# Patient Record
Sex: Male | Born: 1945 | Race: White | Hispanic: No | Marital: Married | Smoking: Never smoker
Health system: Southern US, Community
[De-identification: ages and names within clinical notes are randomized; demographics above are authoritative.]

## PROBLEM LIST (undated history)

## (undated) DIAGNOSIS — Z9981 Dependence on supplemental oxygen: Secondary | ICD-10-CM

## (undated) DIAGNOSIS — D509 Iron deficiency anemia, unspecified: Secondary | ICD-10-CM

## (undated) DIAGNOSIS — C4491 Basal cell carcinoma of skin, unspecified: Secondary | ICD-10-CM

## (undated) DIAGNOSIS — IMO0001 Reserved for inherently not codable concepts without codable children: Secondary | ICD-10-CM

## (undated) DIAGNOSIS — I480 Paroxysmal atrial fibrillation: Secondary | ICD-10-CM

## (undated) DIAGNOSIS — I499 Cardiac arrhythmia, unspecified: Secondary | ICD-10-CM

## (undated) DIAGNOSIS — R011 Cardiac murmur, unspecified: Secondary | ICD-10-CM

## (undated) DIAGNOSIS — I739 Peripheral vascular disease, unspecified: Secondary | ICD-10-CM

## (undated) DIAGNOSIS — G4733 Obstructive sleep apnea (adult) (pediatric): Secondary | ICD-10-CM

## (undated) DIAGNOSIS — H3581 Retinal edema: Secondary | ICD-10-CM

## (undated) DIAGNOSIS — E785 Hyperlipidemia, unspecified: Secondary | ICD-10-CM

## (undated) DIAGNOSIS — I350 Nonrheumatic aortic (valve) stenosis: Secondary | ICD-10-CM

## (undated) DIAGNOSIS — I2510 Atherosclerotic heart disease of native coronary artery without angina pectoris: Secondary | ICD-10-CM

## (undated) DIAGNOSIS — N189 Chronic kidney disease, unspecified: Secondary | ICD-10-CM

## (undated) DIAGNOSIS — I119 Hypertensive heart disease without heart failure: Secondary | ICD-10-CM

## (undated) DIAGNOSIS — Z9289 Personal history of other medical treatment: Secondary | ICD-10-CM

## (undated) DIAGNOSIS — D649 Anemia, unspecified: Secondary | ICD-10-CM

## (undated) DIAGNOSIS — I219 Acute myocardial infarction, unspecified: Secondary | ICD-10-CM

## (undated) DIAGNOSIS — I10 Essential (primary) hypertension: Secondary | ICD-10-CM

## (undated) DIAGNOSIS — E669 Obesity, unspecified: Secondary | ICD-10-CM

## (undated) DIAGNOSIS — K219 Gastro-esophageal reflux disease without esophagitis: Secondary | ICD-10-CM

## (undated) DIAGNOSIS — K259 Gastric ulcer, unspecified as acute or chronic, without hemorrhage or perforation: Secondary | ICD-10-CM

## (undated) DIAGNOSIS — I779 Disorder of arteries and arterioles, unspecified: Secondary | ICD-10-CM

## (undated) DIAGNOSIS — Z950 Presence of cardiac pacemaker: Secondary | ICD-10-CM

## (undated) DIAGNOSIS — E119 Type 2 diabetes mellitus without complications: Secondary | ICD-10-CM

## (undated) DIAGNOSIS — Z9989 Dependence on other enabling machines and devices: Secondary | ICD-10-CM

## (undated) HISTORY — PX: ANGIOPLASTY: SHX39

## (undated) HISTORY — PX: INGUINAL HERNIA REPAIR: SUR1180

## (undated) HISTORY — DX: Anemia, unspecified: D64.9

## (undated) HISTORY — PX: CORONARY ANGIOPLASTY: SHX604

## (undated) HISTORY — PX: BASAL CELL CARCINOMA EXCISION: SHX1214

## (undated) HISTORY — DX: Hypertensive heart disease without heart failure: I11.9

## (undated) HISTORY — DX: Retinal edema: H35.81

## (undated) HISTORY — PX: EYE SURGERY: SHX253

---
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
## 2004-05-25 DIAGNOSIS — K259 Gastric ulcer, unspecified as acute or chronic, without hemorrhage or perforation: Secondary | ICD-10-CM

## 2004-05-25 HISTORY — DX: Gastric ulcer, unspecified as acute or chronic, without hemorrhage or perforation: K25.9

## 2004-09-23 ENCOUNTER — Inpatient Hospital Stay (HOSPITAL_COMMUNITY): Admission: AD | Admit: 2004-09-23 | Discharge: 2004-09-25 | Payer: Self-pay | Admitting: Internal Medicine

## 2004-09-23 ENCOUNTER — Ambulatory Visit: Payer: Self-pay | Admitting: Internal Medicine

## 2004-09-29 ENCOUNTER — Ambulatory Visit: Payer: Self-pay | Admitting: Internal Medicine

## 2004-11-12 ENCOUNTER — Ambulatory Visit: Payer: Self-pay | Admitting: Internal Medicine

## 2004-11-19 ENCOUNTER — Ambulatory Visit: Payer: Self-pay | Admitting: Internal Medicine

## 2008-01-17 ENCOUNTER — Ambulatory Visit: Payer: Self-pay | Admitting: Cardiology

## 2008-02-21 ENCOUNTER — Ambulatory Visit: Payer: Self-pay

## 2008-02-21 ENCOUNTER — Encounter: Payer: Self-pay | Admitting: Cardiology

## 2008-03-14 ENCOUNTER — Ambulatory Visit: Payer: Self-pay | Admitting: Cardiology

## 2008-11-01 DIAGNOSIS — R0989 Other specified symptoms and signs involving the circulatory and respiratory systems: Secondary | ICD-10-CM | POA: Insufficient documentation

## 2008-11-01 DIAGNOSIS — I350 Nonrheumatic aortic (valve) stenosis: Secondary | ICD-10-CM | POA: Insufficient documentation

## 2009-01-03 ENCOUNTER — Encounter (INDEPENDENT_AMBULATORY_CARE_PROVIDER_SITE_OTHER): Payer: Self-pay | Admitting: *Deleted

## 2009-03-04 ENCOUNTER — Encounter (INDEPENDENT_AMBULATORY_CARE_PROVIDER_SITE_OTHER): Payer: Self-pay | Admitting: *Deleted

## 2009-03-04 DIAGNOSIS — R0989 Other specified symptoms and signs involving the circulatory and respiratory systems: Secondary | ICD-10-CM | POA: Insufficient documentation

## 2009-03-04 DIAGNOSIS — R0609 Other forms of dyspnea: Secondary | ICD-10-CM

## 2009-03-13 ENCOUNTER — Ambulatory Visit: Payer: Self-pay

## 2009-03-13 ENCOUNTER — Ambulatory Visit: Payer: Self-pay | Admitting: Cardiology

## 2009-03-13 DIAGNOSIS — I2510 Atherosclerotic heart disease of native coronary artery without angina pectoris: Secondary | ICD-10-CM | POA: Insufficient documentation

## 2009-05-25 HISTORY — PX: CORONARY ARTERY BYPASS GRAFT: SHX141

## 2010-02-10 ENCOUNTER — Telehealth: Payer: Self-pay | Admitting: Cardiology

## 2010-02-13 ENCOUNTER — Ambulatory Visit: Payer: Self-pay | Admitting: Cardiology

## 2010-02-14 ENCOUNTER — Ambulatory Visit: Payer: Self-pay | Admitting: Thoracic Surgery (Cardiothoracic Vascular Surgery)

## 2010-02-14 ENCOUNTER — Inpatient Hospital Stay (HOSPITAL_BASED_OUTPATIENT_CLINIC_OR_DEPARTMENT_OTHER): Admission: RE | Admit: 2010-02-14 | Discharge: 2010-02-14 | Payer: Self-pay | Admitting: Internal Medicine

## 2010-02-14 ENCOUNTER — Encounter: Payer: Self-pay | Admitting: Cardiology

## 2010-02-14 ENCOUNTER — Inpatient Hospital Stay (HOSPITAL_COMMUNITY): Admission: AD | Admit: 2010-02-14 | Discharge: 2010-02-22 | Payer: Self-pay | Admitting: Cardiology

## 2010-02-14 ENCOUNTER — Ambulatory Visit: Payer: Self-pay | Admitting: Internal Medicine

## 2010-02-19 ENCOUNTER — Telehealth: Payer: Self-pay | Admitting: Cardiology

## 2010-03-05 ENCOUNTER — Ambulatory Visit: Payer: Self-pay | Admitting: Cardiology

## 2010-03-05 ENCOUNTER — Emergency Department: Payer: Self-pay | Admitting: Emergency Medicine

## 2010-03-05 DIAGNOSIS — I4891 Unspecified atrial fibrillation: Secondary | ICD-10-CM | POA: Insufficient documentation

## 2010-03-19 ENCOUNTER — Encounter
Admission: RE | Admit: 2010-03-19 | Discharge: 2010-03-19 | Payer: Self-pay | Admitting: Thoracic Surgery (Cardiothoracic Vascular Surgery)

## 2010-03-25 ENCOUNTER — Encounter: Payer: Self-pay | Admitting: Cardiology

## 2010-03-25 ENCOUNTER — Ambulatory Visit: Payer: Self-pay | Admitting: Thoracic Surgery (Cardiothoracic Vascular Surgery)

## 2010-04-03 ENCOUNTER — Encounter (HOSPITAL_COMMUNITY)
Admission: RE | Admit: 2010-04-03 | Discharge: 2010-05-24 | Payer: Self-pay | Source: Home / Self Care | Attending: Cardiology | Admitting: Cardiology

## 2010-04-03 ENCOUNTER — Telehealth (INDEPENDENT_AMBULATORY_CARE_PROVIDER_SITE_OTHER): Payer: Self-pay | Admitting: *Deleted

## 2010-04-07 ENCOUNTER — Ambulatory Visit: Payer: Self-pay | Admitting: Cardiology

## 2010-04-07 DIAGNOSIS — I25810 Atherosclerosis of coronary artery bypass graft(s) without angina pectoris: Secondary | ICD-10-CM | POA: Insufficient documentation

## 2010-04-30 ENCOUNTER — Ambulatory Visit: Payer: Self-pay | Admitting: Cardiothoracic Surgery

## 2010-05-02 ENCOUNTER — Encounter: Payer: Self-pay | Admitting: Cardiology

## 2010-05-25 DIAGNOSIS — C4491 Basal cell carcinoma of skin, unspecified: Secondary | ICD-10-CM

## 2010-05-25 HISTORY — DX: Basal cell carcinoma of skin, unspecified: C44.91

## 2010-05-28 ENCOUNTER — Ambulatory Visit
Admission: RE | Admit: 2010-05-28 | Discharge: 2010-05-28 | Payer: Self-pay | Source: Home / Self Care | Attending: Cardiology | Admitting: Cardiology

## 2010-05-28 DIAGNOSIS — I6529 Occlusion and stenosis of unspecified carotid artery: Secondary | ICD-10-CM | POA: Insufficient documentation

## 2010-06-03 ENCOUNTER — Encounter: Payer: Self-pay | Admitting: Cardiology

## 2010-06-08 ENCOUNTER — Telehealth (INDEPENDENT_AMBULATORY_CARE_PROVIDER_SITE_OTHER): Payer: Self-pay | Admitting: *Deleted

## 2010-06-09 ENCOUNTER — Telehealth: Payer: Self-pay | Admitting: Cardiology

## 2010-06-22 LAB — CONVERTED CEMR LAB
BUN: 13 mg/dL (ref 6–23)
Basophils Absolute: 0 10*3/uL (ref 0.0–0.1)
Basophils Relative: 0.6 % (ref 0.0–3.0)
CO2: 24 meq/L (ref 19–32)
Calcium: 9.3 mg/dL (ref 8.4–10.5)
Chloride: 102 meq/L (ref 96–112)
Creatinine, Ser: 0.9 mg/dL (ref 0.4–1.5)
Eosinophils Absolute: 0.1 10*3/uL (ref 0.0–0.7)
Eosinophils Relative: 2.6 % (ref 0.0–5.0)
GFR calc non Af Amer: 96.49 mL/min (ref >60)
Glucose, Bld: 181 mg/dL — ABNORMAL HIGH (ref 70–99)
HCT: 41.5 % (ref 39.0–52.0)
Hemoglobin: 14.2 g/dL (ref 13.0–17.0)
INR: 0.9 (ref 0.8–1.0)
Lymphocytes Relative: 33.7 % (ref 12.0–46.0)
Lymphs Abs: 1.6 10*3/uL (ref 0.7–4.0)
MCHC: 34.1 g/dL (ref 30.0–36.0)
MCV: 91.4 fL (ref 78.0–100.0)
Monocytes Absolute: 0.4 10*3/uL (ref 0.1–1.0)
Monocytes Relative: 8.8 % (ref 3.0–12.0)
Neutro Abs: 2.6 10*3/uL (ref 1.4–7.7)
Neutrophils Relative %: 54.3 % (ref 43.0–77.0)
Platelets: 217 10*3/uL (ref 150.0–400.0)
Potassium: 4.7 meq/L (ref 3.5–5.1)
Prothrombin Time: 10 s (ref 9.7–11.8)
RBC: 4.54 M/uL (ref 4.22–5.81)
RDW: 12.8 % (ref 11.5–14.6)
Sodium: 140 meq/L (ref 135–145)
WBC: 4.8 10*3/uL (ref 4.5–10.5)
aPTT: 31.1 s — ABNORMAL HIGH (ref 21.7–28.8)

## 2010-06-26 NOTE — Assessment & Plan Note (Signed)
Summary: f17m/dfg   Visit Type:  Follow-up Primary Provider:  Loma Hardy  CC:  no complaints.  History of Present Illness: Seth Hardy returns for followup of his recent coronary bypass grafting.  He is doing remarkably well with no symptoms of angina or ischemia. His midline incision and sternotomy has healed. 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  He is anxious to begin officiating basketball. I released him to go this Friday night.  Clinical Reports Reviewed:  Cardiac Cath:  05/01/2010: Cardiac Cath Findings:   Left ventriculogram done in the RAO position showed an EF of 65% with no   regional Seth Hardy motion abnormalities.  Left subclavian angiography showed   patent LIMA to the chest Seth Hardy.  There was no high-grade stenosis in the   subclavian.      On panning down over the aorta, there was no evidence of abdominal   aortic aneurysm.      ASSESSMENT:   1. 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   2. Normal left ventricular function.   3. Mild aortic stenosis.      PLAN/DISCUSSION:  I have reviewed this also with Seth Hardy.  We both   agree that the best treatment option would be bypass surgery.  We will   have our colleagues from Cardiothoracic Surgery evaluate him.  He will   be admitted over the weekend and started on heparin and await bypass.               Seth Hardy. Bensimhon, MD         ______________________________   Carotid Doppler:  02/16/2010:  Summary:   Bilateral: moderate calcific plaque origin and proximal ICA. Right:   40-59% ICA stenosis, mid range of scale. Left: 60-79% ICA stenosis,   low range of scale. Bilateral vertebral artery flow is antegrade.   Prepared and Electronically Authenticated by    Seth Hardy   2011-09-26T11:38:[id number]  03/13/2009:  Impressions: Mild to moderate plaque, left > right. Stable 40-59% bilateral ICA stenosis.  Seth Haws, MD  02/21/2008:  Mild, bilateral  carotid disease, left greater than right 40 - 59% bilateral ICA stenosis   Current Medications (verified): 1)  Metformin Hcl 1000 Mg Tabs (Metformin Hcl) .Marland Kitchen.. 1 Tab Two Times A Day 2)  Amaryl 4 Mg Tabs (Glimepiride) .Marland Kitchen.. 1 Tab Two Times A Day 3)  Altace 5 Mg Caps (Ramipril) .Marland Kitchen.. 1 Cap Once Daily 4)  Zocor 40 Mg Tabs (Simvastatin) .Marland Kitchen.. 1 Tab Once Daily 5)  Aspirin 81 Mg Tbec (Aspirin) .... Take One Tablet By Mouth Daily 6)  Lantus 100 Unit/ml Soln (Insulin Glargine) .... As Directed 7)  Vitamin C 500 Mg Tabs (Ascorbic Acid) .Marland Kitchen.. 1 Tab Once Daily 8)  Tylenol 325 Mg Tabs (Acetaminophen) .... As Needed 9)  Aleve 220 Mg Tabs (Naproxen Sodium) .... As Needed 10)  Benadryl 25 Mg Caps (Diphenhydramine Hcl) .... As Needed 11)  Fish Oil 1200 Mg Caps (Omega-3 Fatty Acids) .Marland Kitchen.. 1 Cap Once Daily 12)  Nitrostat 0.4 Mg Subl (Nitroglycerin) .Marland Kitchen.. 1 Tablet Under Tongue At Onset of Chest Pain; You May Repeat Every 5 Minutes For Up To 3 Doses. 13)  Metoprolol Succinate 25 Mg Xr24h-Tab (Metoprolol Succinate) .Marland Kitchen.. 1 Tab Qam 14)  Tramadol Hcl 50 Mg Tabs (Tramadol Hcl) .Marland Kitchen.. 1 Tab As Needed 15)  Clonazepam 0.5 Mg Tabs (Clonazepam) .Marland Kitchen.. 1 Tab At Bedtime As Needed 16)  Onglyza 2.5 Mg Tabs (Saxagliptin Hcl) .... Take 1  Tablet By Mouth Once A Day  Allergies (verified): No Known Drug Allergies  Past History:  Past Medical History: Last updated: 03/04/2009 DYSPNEA ON EXERTION (ICD-786.09) MURMUR (ICD-785.2) BRUIT (ICD-785.9)    Past Surgical History: Last updated: 11/01/2008 cyst - L kidney Angioplasty  Social History: Last updated: 11/01/2008 Full Time Tobacco Use - No.  Alcohol Use - yes Regular Exercise - yes Drug Use - no  Risk Factors: Exercise: yes (11/01/2008)  Risk Factors: Smoking Status: never (11/01/2008)  Review of Systems       negative other history of present illness  Vital Signs:  Patient profile:   65 year old male Height:      68 inches Weight:      183 pounds BMI:      27.93 Pulse rate:   55 / minute BP sitting:   138 / 74  (left arm) Cuff size:   regular  Vitals Entered By: Seth Hardy, RMA (May 28, 2010 3:18 PM)  Physical Exam  General:  obese.   Head:  normocephalic and atraumatic Eyes:  PERRLA/EOM intact; conjunctiva and lids normal. Neck:  Neck supple, no JVD. No masses, thyromegaly or abnormal cervical nodes. Lungs:  Clear bilaterally to auscultation and percussion. Heart:  PMI nondisplaced regular rate and rhythm normal S1-S2, systolic murmur S2 splits, no obvious carotid bruit Msk:  Back normal, normal gait. Muscle strength and tone normal. Pulses:  pulses normal in all 4 extremities Extremities:  No clubbing or cyanosis. Neurologic:  Alert and oriented x 3. Skin:  Intact without lesions or rashes. Psych:  Normal affect.   Problems:  Medical Problems Added: 1)  Dx of Carotid Artery Stenosis, Without Infarction  (ICD-433.10)  Impression & Recommendations:  Problem # 1:  CAD, ARTERY BYPASS GRAFT (ICD-414.04)  His updated medication list for this problem includes:    Altace 5 Mg Caps (Ramipril) .Marland Kitchen... 1 cap once daily    Aspirin 81 Mg Tbec (Aspirin) .Marland Kitchen... Take one tablet by mouth daily    Nitrostat 0.4 Mg Subl (Nitroglycerin) .Marland Kitchen... 1 tablet under tongue at onset of chest pain; you may repeat every 5 minutes for up to 3 doses.    Metoprolol Succinate 25 Mg Xr24h-tab (Metoprolol succinate) .Marland Kitchen... 1 tab qam  Problem # 2:  ATRIAL FIBRILLATION (ICD-427.31) Assessment: Improved  His updated medication list for this problem includes:    Aspirin 81 Mg Tbec (Aspirin) .Marland Kitchen... Take one tablet by mouth daily    Metoprolol Succinate 25 Mg Xr24h-tab (Metoprolol succinate) .Marland Kitchen... 1 tab qam  Problem # 3:  CAD, NATIVE VESSEL (ICD-414.01)  His updated medication list for this problem includes:    Altace 5 Mg Caps (Ramipril) .Marland Kitchen... 1 cap once daily    Aspirin 81 Mg Tbec (Aspirin) .Marland Kitchen... Take one tablet by mouth daily    Nitrostat 0.4 Mg Subl  (Nitroglycerin) .Marland Kitchen... 1 tablet under tongue at onset of chest pain; you may repeat every 5 minutes for up to 3 doses.    Metoprolol Succinate 25 Mg Xr24h-tab (Metoprolol succinate) .Marland Kitchen... 1 tab qam  Problem # 4:  MURMUR (ICD-785.2) Assessment: Unchanged  His updated medication list for this problem includes:    Altace 5 Mg Caps (Ramipril) .Marland Kitchen... 1 cap once daily    Nitrostat 0.4 Mg Subl (Nitroglycerin) .Marland Kitchen... 1 tablet under tongue at onset of chest pain; you may repeat every 5 minutes for up to 3 doses.    Metoprolol Succinate 25 Mg Xr24h-tab (Metoprolol succinate) .Marland Kitchen... 1 tab qam  Patient Instructions:  1)  Your physician recommends that you schedule a follow-up appointment in: September 2012 with Dr. Daleen Squibb 2)  Your physician recommends that you continue on your current medications as directed. Please refer to the Current Medication list given to you today.  Appended Document: f38m/dfg    Clinical Lists Changes  Medications: Changed medication from ONGLYZA 2.5 MG TABS (SAXAGLIPTIN HCL) Take 1 tablet by mouth once a day to ONGLYZA 5 MG TABS (SAXAGLIPTIN HCL) Take 1 tablet daily Observations: Added new observation of PI CARDIO: Your physician recommends that you schedule a follow-up appointment in: September 2012 with Dr. Daleen Squibb Your physician recommends that you continue on your current medications as directed. Please refer to the Current Medication list given to you today. (05/28/2010 15:53)       Patient Instructions: 1)  Your physician recommends that you schedule a follow-up appointment in: September 2012 with Dr. Daleen Squibb 2)  Your physician recommends that you continue on your current medications as directed. Please refer to the Current Medication list given to you today.

## 2010-06-26 NOTE — Progress Notes (Signed)
Summary: chestpain  Phone Note Call from Patient Call back at Home Phone [phone] Call back at [phone]   Caller: Patient Reason for Call: Talk to Nurse Details for Reason: c/o chestpain for couple of days. pcp was not contacted.  Initial call taken by: Lorne Skeens,  February 10, 2010 10:08 AM  Follow-up for Phone Call        I spoke with Mr. Raburn and he has experienced "chest tightness" last Friday at a football game and again the weekend while walking in [place].  He is not having any chest tightness at this time.  He is calling for an appt. with Dr. Daleen Squibb asap.  He will be back in town on Wednesday.  I have instructed him that if he is to have any further angina or "chest tightness" he needs to seek medical help immediately irregardles what city he is in.  He agrees.   Mylo Red RN

## 2010-06-26 NOTE — Assessment & Plan Note (Signed)
Summary: f4-6wk/dfg   Visit Type:  4-6 wk f/u Primary Provider:  Loma Hardy  CC:  pt denies any cardiac complaint today.  History of Present Illness: Seth Hardy returns today for his coronary disease, status post carotid bypass grafting about 5 weeks ago, and postoperative atrial fibrillation.  He's had no further events since her last office visit. He is starting cardiac rehabilitation today. He is back to driving and he is been released by thoracic surgery. He is anxious to start refereeing again. I told him to wait until 1 January.  Meds reviewed and is very compliant. Blood pressures have been excellent as his heart rate. Blood sugars are good in the morning but higher in the evening. He wii talk  primary care about this.   Current Medications (verified): 1)  Metformin Hcl 1000 Mg Tabs (Metformin Hcl) .Marland Kitchen.. 1 Tab Two Times A Day 2)  Amaryl 4 Mg Tabs (Glimepiride) .Marland Kitchen.. 1 Tab Two Times A Day 3)  Altace 5 Mg Caps (Ramipril) .Marland Kitchen.. 1 Cap Once Daily 4)  Zocor 40 Mg Tabs (Simvastatin) .Marland Kitchen.. 1 Tab Once Daily 5)  Aspirin 81 Mg Tbec (Aspirin) .... Take One Tablet By Mouth Daily 6)  Lantus 100 Unit/ml Soln (Insulin Glargine) .... As Directed 7)  Vitamin C 500 Mg Tabs (Ascorbic Acid) .Marland Kitchen.. 1 Tab Once Daily 8)  Tylenol 325 Mg Tabs (Acetaminophen) .... As Needed 9)  Aleve 220 Mg Tabs (Naproxen Sodium) .... As Needed 10)  Benadryl 25 Mg Caps (Diphenhydramine Hcl) .... As Needed 11)  Fish Oil 1200 Mg Caps (Omega-3 Fatty Acids) .Marland Kitchen.. 1 Cap Once Daily 12)  Nitrostat 0.4 Mg Subl (Nitroglycerin) .Marland Kitchen.. 1 Tablet Under Tongue At Onset of Chest Pain; You May Repeat Every 5 Minutes For Up To 3 Doses. 13)  Metoprolol Succinate 25 Mg Xr24h-Tab (Metoprolol Succinate) .Marland Kitchen.. 1 Tab Qam 14)  Tramadol Hcl 50 Mg Tabs (Tramadol Hcl) .Marland Kitchen.. 1 Tab As Needed 15)  Clonazepam 0.5 Mg Tabs (Clonazepam) .Marland Kitchen.. 1 Tab At Bedtime As Needed  Allergies (verified): No Known Drug Allergies  Past History:  Past Medical  History: Last updated: 03/04/2009 DYSPNEA ON EXERTION (ICD-786.09) MURMUR (ICD-785.2) BRUIT (ICD-785.9)    Past Surgical History: Last updated: 11/01/2008 cyst - L kidney Angioplasty  Social History: Last updated: 11/01/2008 Full Time Tobacco Use - No.  Alcohol Use - yes Regular Exercise - yes Drug Use - no  Risk Factors: Exercise: yes (11/01/2008)  Risk Factors: Smoking Status: never (11/01/2008)  Review of Systems       negative other than history of present illness  Vital Signs:  Patient profile:   65 year old male Height:      68 inches Weight:      177.4 pounds BMI:     27.07 Pulse rate:   72 / minute Pulse rhythm:   irregular BP sitting:   142 / 70  (left arm) Cuff size:   large  Vitals Entered By: Danielle Rankin, CMA (April 07, 2010 11:15 AM)  Physical Exam  General:  obese.  no acute distress Head:  normocephalic and atraumatic Eyes:  PERRLA/EOM intact; conjunctiva and lids normal. Neck:  Neck supple, no JVD. No masses, thyromegaly or abnormal cervical nodes. Chest Wall:  no deformities or breast masses noted median sternotomy healing nicely Lungs:  Clear bilaterally to auscultation and percussion. Heart:  PMI nondisplaced, regular rate and rhythm, split S2, no rub or murmur. Carotid upstrokes equal bilaterally without bruits Msk:  Back normal, normal gait. Muscle strength and  tone normal. Pulses:  pulses normal in all 4 extremities Extremities:  No clubbing or cyanosis. Neurologic:  Alert and oriented x 3. Skin:  Intact without lesions or rashes. Psych:  Normal affect.   Impression & Recommendations:  Problem # 1:  ATRIAL FIBRILLATION (ICD-427.31) Assessment Improved He was very symptomatic and will most likely note he has recurrence. Hopefully he is beyond the postoperative vulnerable period. Continue current medications. His updated medication list for this problem includes:    Aspirin 81 Mg Tbec (Aspirin) .Marland Kitchen... Take one tablet by mouth  daily    Metoprolol Succinate 25 Mg Xr24h-tab (Metoprolol succinate) .Marland Kitchen... 1 tab qam  Problem # 2:  CAD, NATIVE VESSEL (ICD-414.01) Assessment: Unchanged  His updated medication list for this problem includes:    Altace 5 Mg Caps (Ramipril) .Marland Kitchen... 1 cap once daily    Aspirin 81 Mg Tbec (Aspirin) .Marland Kitchen... Take one tablet by mouth daily    Nitrostat 0.4 Mg Subl (Nitroglycerin) .Marland Kitchen... 1 tablet under tongue at onset of chest pain; you may repeat every 5 minutes for up to 3 doses.    Metoprolol Succinate 25 Mg Xr24h-tab (Metoprolol succinate) .Marland Kitchen... 1 tab qam  Patient Instructions: 1)  Your physician recommends that you schedule a follow-up appointment in: 2 months with Dr. Daleen Squibb 2)  Your physician recommends that you continue on your current medications as directed. Please refer to the Current Medication list given to you today. 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

## 2010-06-26 NOTE — Cardiovascular Report (Signed)
Summary: Pre Cath Orders   Pre Cath Orders   Imported By: Roderic Ovens 02/20/2010 16:22:29  _____________________________________________________________________  External Attachment:    Type:   Image     Comment:   External Document

## 2010-06-26 NOTE — Miscellaneous (Signed)
Summary: Orders Update  Clinical Lists Changes  Orders: Added new Test order of Carotid Duplex (Carotid Duplex) - Signed 

## 2010-06-26 NOTE — Miscellaneous (Signed)
Summary: [facility] Progress Note   [facility] Progress Note   Imported By: Roderic Ovens 05/27/2010 11:43:16  _____________________________________________________________________  External Attachment:    Type:   Image     Comment:   External Document

## 2010-06-26 NOTE — Letter (Signed)
Summary: Cardiac Catheterization Instructions- [facility]  [facility], Main Office  [address]   [place], [place] [address]   Phone: [phone]  Fax: [phone]     02/13/2010 MRN: [id number]  Seth Hardy [address] [place], [place]  [address]  Dear Mr. GRASMICK,   [name] are scheduled for a Cardiac Catheterization on 02/14/10 with Dr.Bensimhon  Please arrive to the 1st floor of the Heart and Vascular Center at [facility] at 11:00 am  on the day of your procedure. Please do not arrive before 6:30 a.m. Call the Heart and Vascular Center at [phone] if you are unable to make your appointmnet. The Code to get into the parking garage under the building is 0020. Take the elevators to the 1st floor. You must have someone to drive you home. Someone must be with you for the first 24 hours after you arrive home. Please wear clothes that are easy to get on and off and wear slip-on shoes. Do not eat or drink after midnight except water with your medications that morning. Bring all your medications and current insurance cards with you.  ___ DO NOT take these medications before your procedure:Metformin--don't take today 9/22, tomorrow 9/23, or the next day 9/24,hold Amarly and and only take 1/2 insulin dose All other medications ok to take with sip of water  ___ Make sure you take your aspirin.   The usual length of stay after your procedure is 2 to 3 hours. This can vary.  If you have any questions, please call the office at the number listed above.   Dennis Bast, RN, BSN

## 2010-06-26 NOTE — Assessment & Plan Note (Signed)
Summary: CAD/ANAS   Visit Type:  1 yr f/u Primary Provider:  Loma Sender  CC:  no cardiac complaints today.  History of Present Illness: [name] returns today for followup of his coronary disease, history of remote PCI in 1996, mild aortic stenosis, and nonobstructive carotid artery disease.  His telemetry is a dramatic. Specifically denies any dyspnea on exertion, syncope, or angina.  He denies any symptoms of TIAs or mini strokes. Carotid Dopplers are pending today.  His last stress Myoview as last year and it showed no ischemia with LV function. His last echocardiogram was last year as well which showed mild aortic stenosis.  Current Medications (verified): 1)  Metformin Hcl 1000 Mg Tabs (Metformin Hcl) .Marland Kitchen.. 1 Tab Two Times A Day 2)  Amaryl 4 Mg Tabs (Glimepiride) .Marland Kitchen.. 1 Tab Two Times A Day 3)  Altace 5 Mg Caps (Ramipril) .Marland Kitchen.. 1 Cap Once Daily 4)  Zocor 40 Mg Tabs (Simvastatin) .Marland Kitchen.. 1 Tab Once Daily 5)  Aspirin 81 Mg Tbec (Aspirin) .... Take One Tablet By Mouth Daily 6)  Lantus 100 Unit/ml Soln (Insulin Glargine) .... As Directed 7)  Vitamin E 400 Unit Caps (Vitamin E) .Marland Kitchen.. 1 Cap Once Daily 8)  Vitamin C 500 Mg Tabs (Ascorbic Acid) .Marland Kitchen.. 1 Tab Once Daily 9)  Cinnamon 500 Mg Tabs (Cinnamon) .... 2 Tab Once Daily 10)  Tylenol 325 Mg Tabs (Acetaminophen) .... As Needed 11)  Ibuprofen 200 Mg Tabs (Ibuprofen) .... As Needed 12)  Aleve 220 Mg Tabs (Naproxen Sodium) .... As Needed 13)  Benadryl 25 Mg Caps (Diphenhydramine Hcl) .... As Needed  Allergies (verified): No Known Drug Allergies  Past History:  Past Medical History: Last updated: 03/04/2009 DYSPNEA ON EXERTION (ICD-786.09) MURMUR (ICD-785.2) BRUIT (ICD-785.9)    Past Surgical History: Last updated: 11/01/2008 cyst - L kidney Angioplasty  Social History: Last updated: 11/01/2008 Full Time Tobacco Use - No.  Alcohol Use - yes Regular Exercise - yes Drug Use - no  Risk Factors: Exercise: yes  (11/01/2008)  Risk Factors: Smoking Status: never (11/01/2008)  Review of Systems       negative other than history of present illness  Vital Signs:  Patient profile:   65 year old male Height:      68 inches Weight:      182 pounds BMI:     27.77 Pulse rate:   58 / minute Pulse rhythm:   regular BP sitting:   138 / 80  (left arm) Cuff size:   large  Vitals Entered By: Danielle Rankin, CMA (March 13, 2009 12:14 PM)  Physical Exam  General:  obese.   Head:  normocephalic and atraumatic Eyes:  PERRLA/EOM intact; conjunctiva and lids normal. Mouth:  Teeth, gums and palate normal. Oral mucosa normal. Neck:  Neck supple, no JVD. No masses, thyromegaly or abnormal cervical nodes. Lungs:  Clear bilaterally to auscultation and percussion. Heart:  soft systolic murmur left lower sternal border, S2 splits, regular rate and rhythm, PMI nondisplaced and nonsustained. Right carotid bruit Abdomen:  Bowel sounds positive; abdomen soft and non-tender without masses, organomegaly, or hernias noted. No hepatosplenomegaly. Msk:  Back normal, normal gait. Muscle strength and tone normal. Pulses:  pulses normal in all 4 extremities Extremities:  No clubbing or cyanosis. Neurologic:  Alert and oriented x 3. Skin:  Intact without lesions or rashes. Psych:  Normal affect.   Impression & Recommendations:  Problem # 1:  CAD, NATIVE VESSEL (ICD-414.01) Assessment Unchanged  His updated medication list for  this problem includes:    Altace 5 Mg Caps (Ramipril) .Marland Kitchen... 1 cap once daily    Aspirin 81 Mg Tbec (Aspirin) .Marland Kitchen... Take one tablet by mouth daily  Problem # 2:  MURMUR (ICD-785.2) Assessment: Unchanged  His updated medication list for this problem includes:    Altace 5 Mg Caps (Ramipril) .Marland Kitchen... 1 cap once daily  His updated medication list for this problem includes:    Altace 5 Mg Caps (Ramipril) .Marland Kitchen... 1 cap once daily He has a history of aortic stenosis there was mild by 2-D  echocardiogram last year. He is totally asymptomatic. His S2 splits on exam. Will follow with him again next year.  Problem # 3:  BRUIT (ICD-785.9) Assessment: Unchanged carotid Dopplers have been done this morning.  Problem # 4:  DYSPNEA ON EXERTION (ICD-786.09) Assessment: Improved  His updated medication list for this problem includes:    Altace 5 Mg Caps (Ramipril) .Marland Kitchen... 1 cap once daily    Aspirin 81 Mg Tbec (Aspirin) .Marland Kitchen... Take one tablet by mouth daily  His updated medication list for this problem includes:    Altace 5 Mg Caps (Ramipril) .Marland Kitchen... 1 cap once daily    Aspirin 81 Mg Tbec (Aspirin) .Marland Kitchen... Take one tablet by mouth daily  Patient Instructions: 1)  Your physician recommends that you schedule a follow-up appointment in: 12 MONTHS 2)  Your physician recommends that you continue on your current medications as directed. Please refer to the Current Medication list given to you today.

## 2010-06-26 NOTE — Progress Notes (Signed)
Summary: wife has questions  Phone Note Call from Patient   Caller: Spouse Corrie Dandy [phone] Reason for Call: Talk to Nurse Summary of Call: pt had heart cath friday and bypass monday-wife mary has questions Initial call taken by: Glynda Jaeger,  February 19, 2010 3:38 PM  Follow-up for Phone Call        Reassured wife.  Pt is doing well.  Wanted Dr. Daleen Squibb to know he is doing better.  I will forward this to Dr. Lonia Chimera RN

## 2010-06-26 NOTE — Miscellaneous (Signed)
Summary: [facility] Physician Order/Treatment Plan   [facility] Physician Order/Treatment Plan   Imported By: Roderic Ovens 03/24/2010 10:46:28  _____________________________________________________________________  External Attachment:    Type:   Image     Comment:   External Document

## 2010-06-26 NOTE — Assessment & Plan Note (Signed)
Summary: rov. chestpain./ gd   Visit Type:  rov Primary Provider:  Loma Sender  CC:  chest discomfort...sob....denies any edema.  History of Present Illness: Mr. Hinderman returns today with a chief complaint of exertional chest tightness. 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 It is very similar to what he had back in the 90s prior to his intervention.  He had this again while being out of town over the weekend. He has not had rest or nocturnal symptoms.  He is compliant with his medications. He works a very tight schedule his wife is worried about the stress level.   Current Medications (verified): 1)  Metformin Hcl 1000 Mg Tabs (Metformin Hcl) .Marland Kitchen.. 1 Tab Two Times A Day 2)  Amaryl 4 Mg Tabs (Glimepiride) .Marland Kitchen.. 1 Tab Two Times A Day 3)  Altace 5 Mg Caps (Ramipril) .Marland Kitchen.. 1 Cap Once Daily 4)  Zocor 40 Mg Tabs (Simvastatin) .Marland Kitchen.. 1 Tab Once Daily 5)  Aspirin 81 Mg Tbec (Aspirin) .... Take One Tablet By Mouth Daily 6)  Lantus 100 Unit/ml Soln (Insulin Glargine) .... As Directed 7)  Vitamin C 500 Mg Tabs (Ascorbic Acid) .Marland Kitchen.. 1 Tab Once Daily 8)  Cinnamon 500 Mg Tabs (Cinnamon) .... 2 Tab Once Daily 9)  Tylenol 325 Mg Tabs (Acetaminophen) .... As Needed 10)  Ibuprofen 200 Mg Tabs (Ibuprofen) .... As Needed 11)  Aleve 220 Mg Tabs (Naproxen Sodium) .... As Needed 12)  Benadryl 25 Mg Caps (Diphenhydramine Hcl) .... As Needed 13)  Fish Oil 1200 Mg Caps (Omega-3 Fatty Acids) .Marland Kitchen.. 1 Cap Once Daily  Allergies (verified): No Known Drug Allergies  Past History:  Past Medical History: Last updated: 03/04/2009 DYSPNEA ON EXERTION (ICD-786.09) MURMUR (ICD-785.2) BRUIT (ICD-785.9)    Past Surgical History: Last updated: 11/01/2008 cyst - L kidney Angioplasty  Social History: Last updated: 11/01/2008 Full Time Tobacco Use - No.  Alcohol Use - yes Regular Exercise - yes Drug Use - no  Risk Factors: Exercise: yes (11/01/2008)  Risk  Factors: Smoking Status: never (11/01/2008)  Review of Systems       negative other than history of present illness  Vital Signs:  Patient profile:   65 year old male Height:      68 inches Weight:      180.8 pounds BMI:     27.59 Pulse rate:   57 / minute Pulse rhythm:   irregular BP sitting:   170 / 90  (left arm) Cuff size:   large  Vitals Entered By: Danielle Rankin, CMA (February 13, 2010 12:12 PM)  Physical Exam  General:  overweight, in no acute distress Head:  normocephalic and atraumatic Eyes:  PERRLA/EOM intact; conjunctiva and lids normal. Neck:  Neck supple, no JVD. No masses, thyromegaly or abnormal cervical nodes. Chest Wall:  no deformities or breast masses noted Lungs:  Clear bilaterally to auscultation and percussion. Heart:  PMI nondisplaced, soft S1-S2, soft systolic murmur along left sternal border, S2 splits, no carotid bruits Abdomen:  Bowel sounds positive; abdomen soft and non-tender without masses, organomegaly, or hernias noted. No hepatosplenomegaly. Msk:  Back normal, normal gait. Muscle strength and tone normal. Pulses:  pulses normal in all 4 extremities Extremities:  No clubbing or cyanosis. Neurologic:  Alert and oriented x 3. Skin:  Intact without lesions or rashes. Psych:  Normal affect.   EKG  Procedure date:  02/13/2010  Findings:      normal sinus rhythm, lateral ST depression and T-wave  inversion which is a little worse than his last EKG a year ago.  Impression & Recommendations:  Problem # 1:  CAD, NATIVE VESSEL (ICD-414.01) Assessment Deteriorated He is having exertional angina. He most likely has a recurrent obstructive lesion. We have given him instructions to not initiate or exert himself until his catheterization tomorrow. He was given several nitroglycerin with instructions on his shoes. He was still the head rest or nocturnal pain to call 911. Indications risks potential benefits discussed. He agrees to proceed. His updated  medication list for this problem includes:    Altace 5 Mg Caps (Ramipril) .Marland Kitchen... 1 cap once daily    Aspirin 81 Mg Tbec (Aspirin) .Marland Kitchen... Take one tablet by mouth daily    Nitrostat 0.4 Mg Subl (Nitroglycerin) .Marland Kitchen... 1 tablet under tongue at onset of chest pain; you may repeat every 5 minutes for up to 3 doses.  Orders: TLB-BMP (Basic Metabolic Panel-BMET) (80048-METABOL) TLB-CBC Platelet - w/Differential (85025-CBCD) TLB-PT (Protime) (85610-PTP) TLB-PTT (85730-PTTL)  Problem # 2:  MURMUR (ICD-785.2) He has mild aortic stenosis by echocardiogram. We should cross the valve tomorrow to check gradient. His updated medication list for this problem includes:    Altace 5 Mg Caps (Ramipril) .Marland Kitchen... 1 cap once daily    Nitrostat 0.4 Mg Subl (Nitroglycerin) .Marland Kitchen... 1 tablet under tongue at onset of chest pain; you may repeat every 5 minutes for up to 3 doses.  Problem # 3:  BRUIT (ICD-785.9) Assessment: Unchanged He will need outpatient carotid Dopplers. He is totally asymptomatic at present we'll continue medical therapy.  Other Orders: Cardiac Catheterization (Cardiac Cath)  Patient Instructions: 1)  Your physician has requested that you have a cardiac catheterization.  Cardiac catheterization is used to diagnose and/or treat various heart conditions. Doctors may recommend this procedure for a number of different reasons. The most common reason is to evaluate chest pain. Chest pain can be a symptom of coronary artery disease (CAD), and cardiac catheterization can show whether plaque is narrowing or blocking your heart's arteries. This procedure is also used to evaluate the valves, as well as measure the blood flow and oxygen levels in different parts of your heart.  For further information please visit [email or web address]  Please follow instruction sheet, as given. Prescriptions: NITROSTAT 0.4 MG SUBL (NITROGLYCERIN) 1 tablet under tongue at onset of chest pain; you may repeat every 5 minutes for up to  3 doses.  #25 x 2   Entered by:   Dennis Bast, RN, BSN   Authorized by:   Gaylord Shih, MD, [facility]   Signed by:   Dennis Bast, RN, BSN on 02/13/2010   Method used:   Electronically to        [facility] [address] (retail)       [address]       [place], [place]  [address]       Ph: [phone]       Fax: [phone]   RxID:   [id number]

## 2010-06-26 NOTE — Consult Note (Signed)
Summary: [facility]   [facility]   Imported By: Roderic Ovens 03/11/2010 15:11:48  _____________________________________________________________________  External Attachment:    Type:   Image     Comment:   External Document

## 2010-06-26 NOTE — Letter (Signed)
Summary: Appointment - Reminder 2  [facility], Main Office  [address]   [place], [place] [address]   Phone: [phone]  Fax: [phone]     January 03, 2009 MRN: [id number]   Seth Hardy [address] [place], [place]  [address]   Dear [name],  Our records indicate that it is time to schedule a follow-up appointment.  Dr.Wall recommended that you follow up with [place] in Oct 2010. It is very important that we reach you to schedule this appointment. We look forward to participating in your health care needs. Please contact us at the number listed above at your earliest convenience to schedule your appointment.  If you are unable to make an appointment at this time, give [place] a call so we can update our records.     Sincerely,  Lorne Skeens  [facility] Scheduling Team

## 2010-06-26 NOTE — Progress Notes (Signed)
Summary: Records Request  Faxed OV & EKG to [place] at [facility] ([phone]). Debby Freiberg  April 03, 2010 11:37 AM

## 2010-06-26 NOTE — Progress Notes (Signed)
Summary: Cardiology Phone Note - Fast HR  Phone Note Call from Patient   Caller: Patient Summary of Call: Pt called to report high heart rate. Chart reviewed, recent bypass 01/2010 with history of post-op AF at that time. Not on Coumadin. Today, before church noted his heart rate sped up. Initial heart rate 150. Took extra metoprolol --> 110, but his heart rate has continued to climb up to the 130's. He feels totally well without any CP, sob, dizziness, weakness. BP 120's-140's systolic. Discussed with Dr. Shirlee Latch --> directed pt to take additional metoprolol x 1 now, see how he feels. If he notes persistently elevated heart rate with accompanying symptoms or low BP, instructed to proceed to ER. Will also have pt seen in the office tomorrow. Pt expressed understanding. Initial call taken by: Ronie Spies PA-C

## 2010-06-26 NOTE — Letter (Signed)
Summary: [facility]   [facility]   Imported By: Marylou Mccoy 06/11/2010 16:03:35  _____________________________________________________________________  External Attachment:    Type:   Image     Comment:   External Document

## 2010-06-26 NOTE — Miscellaneous (Signed)
Summary: chart update  Clinical Lists Changes       Problems:  Medical Problems Added: 1)  Dx of Dyspnea On Exertion  (ICD-786.09)   Past History:  Past Medical History: DYSPNEA ON EXERTION (ICD-786.09) MURMUR (ICD-785.2) BRUIT (ICD-785.9)

## 2010-06-26 NOTE — Assessment & Plan Note (Signed)
Summary: eph/a-fib/per er/mt   Visit Type:  DOD ADD-ON Primary Cheo Selvey:  Loma Sender  CC:  pt went to [facility] last night for A-fib...Marland Kitchenpt states he was laying on his couch last night and said he felt his heart go out of rhythm.....denies any other complaints today.  History of Present Illness: Mr Dobratz comes in today after spending most of the night in the emergency room at [facility]. He was watching the news and felt his heart rate skipping and beating faster. His next-door neighbors anesthesiologist and she attained a heart rate of about 110-115 apically.  He went emergency room at [facility]. According to him he was given extra Toprol and oxygen. It settled down.  We've obtained blood work which was really unremarkable. Chest x-ray demonstrated bibasilar atelectasis with no overt fluid.  He is now about 2 weeks out from coronary artery bypass surgery. His postoperative course was remarkable only for some bradycardia which required external pacing. He remained in the 50s and 60s and so his beta blocker was decreased.   Current Medications (verified): 1)  Metformin Hcl 1000 Mg Tabs (Metformin Hcl) .Marland Kitchen.. 1 Tab Two Times A Day 2)  Amaryl 4 Mg Tabs (Glimepiride) .Marland Kitchen.. 1 Tab Two Times A Day 3)  Altace 5 Mg Caps (Ramipril) .Marland Kitchen.. 1 Cap Once Daily 4)  Zocor 40 Mg Tabs (Simvastatin) .Marland Kitchen.. 1 Tab Once Daily 5)  Aspirin 81 Mg Tbec (Aspirin) .... Take One Tablet By Mouth Daily 6)  Lantus 100 Unit/ml Soln (Insulin Glargine) .... As Directed 7)  Vitamin C 500 Mg Tabs (Ascorbic Acid) .Marland Kitchen.. 1 Tab Once Daily 8)  Tylenol 325 Mg Tabs (Acetaminophen) .... As Needed 9)  Aleve 220 Mg Tabs (Naproxen Sodium) .... As Needed 10)  Benadryl 25 Mg Caps (Diphenhydramine Hcl) .... As Needed 11)  Fish Oil 1200 Mg Caps (Omega-3 Fatty Acids) .Marland Kitchen.. 1 Cap Once Daily 12)  Nitrostat 0.4 Mg Subl (Nitroglycerin) .Marland Kitchen.. 1 Tablet Under Tongue At Onset of Chest Pain; You May Repeat Every 5 Minutes For Up To 3  Doses. 13)  Metoprolol Succinate 25 Mg Xr24h-Tab (Metoprolol Succinate) .Marland Kitchen.. 1 Tab Qam 14)  Tramadol Hcl 50 Mg Tabs (Tramadol Hcl) .Marland Kitchen.. 1 Tab As Needed 15)  Clonazepam 0.5 Mg Tabs (Clonazepam) .Marland Kitchen.. 1 Tab At Bedtime As Needed  Allergies (verified): No Known Drug Allergies  Past History:  Past Surgical History: Last updated: 11/01/2008 cyst - L kidney Angioplasty  Review of Systems       negative other than history of present illness  Vital Signs:  Patient profile:   65 year old male Height:      68 inches Weight:      176.12 pounds BMI:     26.88 Pulse rate:   67 / minute Pulse rhythm:   irregular BP sitting:   142 / 80  (left arm) Cuff size:   large  Vitals Entered By: Danielle Rankin, CMA (March 05, 2010 2:07 PM)  Physical Exam  General:  obese.   Head:  normocephalic and atraumatic Eyes:  PERRLA/EOM intact; conjunctiva and lids normal. Neck:  Neck supple, no JVD. No masses, thyromegaly or abnormal cervical nodes. Chest Wall:  healing median sternotomy Lungs:  decreased breath sounds in the bases Heart:  PMI nondisplaced, regular rate and rhythm, normal S1-S2, no rub Msk:  Back normal, normal gait. Muscle strength and tone normal. Pulses:  pulses normal in all 4 extremities Extremities:  trace right pedal edema.   Neurologic:  Alert and oriented x 3.  Skin:  Intact without lesions or rashes. Psych:  Normal affect.   Impression & Recommendations:  Problem # 1:  CAD, NATIVE VESSEL (ICD-414.01) Assessment Improved  His updated medication list for this problem includes:    Altace 5 Mg Caps (Ramipril) .Marland Kitchen... 1 cap once daily    Aspirin 81 Mg Tbec (Aspirin) .Marland Kitchen... Take one tablet by mouth daily    Nitrostat 0.4 Mg Subl (Nitroglycerin) .Marland Kitchen... 1 tablet under tongue at onset of chest pain; you may repeat every 5 minutes for up to 3 doses.    Metoprolol Succinate 25 Mg Xr24h-tab (Metoprolol succinate) .Marland Kitchen... 1 tab qam  Orders: EKG w/ Interpretation (93000)  Problem # 2:   MURMUR (ICD-785.2) Assessment: Unchanged  His updated medication list for this problem includes:    Altace 5 Mg Caps (Ramipril) .Marland Kitchen... 1 cap once daily    Nitrostat 0.4 Mg Subl (Nitroglycerin) .Marland Kitchen... 1 tablet under tongue at onset of chest pain; you may repeat every 5 minutes for up to 3 doses.    Metoprolol Succinate 25 Mg Xr24h-tab (Metoprolol succinate) .Marland Kitchen... 1 tab qam  Problem # 3:  ATRIAL FIBRILLATION (ICD-427.31) Assessment: New This is a new problem. He has had a significant problem postoperative bradycardia his heart rate was only in the 110 range when checked apically by his neighbor his anesthesiologist. He's currently in sinus rhythm. We'll continue with metoprolol succinate 25 mg each morning and instructed him to take an extra dose if this recurs. After 45 minutes to an hour if he is still symptomatic he has to report to you the office or the emergency room depending on the time of day. Reassurance was given. His updated medication list for this problem includes:    Aspirin 81 Mg Tbec (Aspirin) .Marland Kitchen... Take one tablet by mouth daily    Metoprolol Succinate 25 Mg Xr24h-tab (Metoprolol succinate) .Marland Kitchen... 1 tab qam  Patient Instructions: 1)  Your physician recommends that you schedule a follow-up appointment in: 4-6 weeks with Dr. Daleen Squibb 2)  Your physician recommends that you continue on your current medications as directed. Please refer to the Current Medication list given to you today.  If you experience atrial fibrillation again at home you may take an extra Metoprolol succinate 25mg .  It will take from to 1 hour to get into your system.  If  this does not control your heart rhythm please call EMS if it is after office hours.  If this occurs during office hours please call our office. Prescriptions: METOPROLOL SUCCINATE 25 MG XR24H-TAB (METOPROLOL SUCCINATE) 1 tab qam  #30 x 6   Entered by:   Lisabeth Devoid RN   Authorized by:   Gaylord Shih, MD, [facility]   Signed by:   Lisabeth Devoid RN  on 03/05/2010   Method used:   Electronically to        [facility] (retail)       [address]       [place], [place]  [address]       Ph: [phone]       Fax: [phone]   RxID:   [id number]

## 2010-06-26 NOTE — Progress Notes (Signed)
Summary: re afib  Phone Note Call from Patient Call back at Home Phone [phone] Call back at [phone]   Caller: Patient Reason for Call: Talk to Nurse Summary of Call: pt states he was in afib yesterday. pt did not go to the hospital. pt would like to talk to a nurse. pt states this morning his heart is fine his blood pressure was 127/67 Initial call taken by: Roe Coombs,  June 09, 2010 9:07 AM  Follow-up for Phone Call        I spoke with the pt and his heart rate was irregular and running around 150 yesterday. The pt did speak with [facility] and followed her instructions about taking metoprolol.  Today the pt feels okay.  His pulse is 54 and BP 127/67.  I will review this information with Dr Daleen Squibb for further recommendations.  Julieta Gutting, RN, BSN  June 09, 2010 9:38 AM  At this time Dr Daleen Squibb does not want to make any changes with pt treatment.  Dr Daleen Squibb would like the pt to call the office if he has recurrent symptoms. Pt aware and agreed with plan.   Julieta Gutting, RN, BSN  June 09, 2010 10:03 AM  Follow-up by: Gaylord Shih, MD, [facility],  June 09, 2010 10:02 AM

## 2010-07-10 NOTE — Progress Notes (Signed)
Summary: [facility]: Office Visit  [facility]: Office Visit   Imported By: Earl Many 07/03/2010 17:18:46  _____________________________________________________________________  External Attachment:    Type:   Image     Comment:   External Document

## 2010-08-01 ENCOUNTER — Encounter: Payer: Self-pay | Admitting: Cardiology

## 2010-08-04 LAB — GLUCOSE, CAPILLARY: Glucose-Capillary: 263 mg/dL — ABNORMAL HIGH (ref 70–99)

## 2010-08-05 LAB — GLUCOSE, CAPILLARY
Glucose-Capillary: 110 mg/dL — ABNORMAL HIGH (ref 70–99)
Glucose-Capillary: 117 mg/dL — ABNORMAL HIGH (ref 70–99)
Glucose-Capillary: 129 mg/dL — ABNORMAL HIGH (ref 70–99)
Glucose-Capillary: 162 mg/dL — ABNORMAL HIGH (ref 70–99)
Glucose-Capillary: 183 mg/dL — ABNORMAL HIGH (ref 70–99)
Glucose-Capillary: 189 mg/dL — ABNORMAL HIGH (ref 70–99)

## 2010-08-05 NOTE — Miscellaneous (Signed)
  Clinical Lists Changes LMOVM prescription change sent to pt pharmacy Mylo Red RN Medications: Changed medication from METOPROLOL SUCCINATE 25 MG XR24H-TAB (METOPROLOL SUCCINATE) 1 tab qam to METOPROLOL TARTRATE 25 MG TABS (METOPROLOL TARTRATE) Take one tablet by mouth twice a day - Signed Rx of METOPROLOL TARTRATE 25 MG TABS (METOPROLOL TARTRATE) Take one tablet by mouth twice a day;  #60 x 6;  Signed;  Entered by: Lisabeth Devoid RN;  Authorized by: Gaylord Shih, MD, [facility];  Method used: Electronically to [facility] [address], [place], [place], [place]  [address], Ph: [phone], Fax: [phone]    Prescriptions: METOPROLOL TARTRATE 25 MG TABS (METOPROLOL TARTRATE) Take one tablet by mouth twice a day  #60 x 6   Entered by:   Lisabeth Devoid RN   Authorized by:   Gaylord Shih, MD, [facility]   Signed by:   Lisabeth Devoid RN on 08/01/2010   Method used:   Electronically to        [facility] [address] (retail)       [address], [address]       [place], [place]  [address]       Ph: [phone]       Fax: [phone]   RxID:   [id number]

## 2010-08-06 LAB — GLUCOSE, CAPILLARY
Glucose-Capillary: 167 mg/dL — ABNORMAL HIGH (ref 70–99)
Glucose-Capillary: 186 mg/dL — ABNORMAL HIGH (ref 70–99)

## 2010-08-07 LAB — POCT I-STAT 3, ART BLOOD GAS (G3+)
Acid-base deficit: 2 mmol/L (ref 0.0–2.0)
Acid-base deficit: 3 mmol/L — ABNORMAL HIGH (ref 0.0–2.0)
Acid-base deficit: 3 mmol/L — ABNORMAL HIGH (ref 0.0–2.0)
Acid-base deficit: 3 mmol/L — ABNORMAL HIGH (ref 0.0–2.0)
Bicarbonate: 21.5 mEq/L (ref 20.0–24.0)
Bicarbonate: 21.8 mEq/L (ref 20.0–24.0)
Bicarbonate: 23.2 mEq/L (ref 20.0–24.0)
Bicarbonate: 23.9 mEq/L (ref 20.0–24.0)
Bicarbonate: 25.1 mEq/L — ABNORMAL HIGH (ref 20.0–24.0)
O2 Saturation: 100 %
O2 Saturation: 92 %
O2 Saturation: 95 %
O2 Saturation: 95 %
O2 Saturation: 98 %
Patient temperature: 35.4
Patient temperature: 37.7
Patient temperature: 37.9
Patient temperature: 38
TCO2: 23 mmol/L (ref 0–100)
TCO2: 23 mmol/L (ref 0–100)
TCO2: 24 mmol/L (ref 0–100)
TCO2: 25 mmol/L (ref 0–100)
TCO2: 26 mmol/L (ref 0–100)
pCO2 arterial: 36.7 mmHg (ref 35.0–45.0)
pCO2 arterial: 37.7 mmHg (ref 35.0–45.0)
pCO2 arterial: 40 mmHg (ref 35.0–45.0)
pCO2 arterial: 40.6 mmHg (ref 35.0–45.0)
pCO2 arterial: 53 mmHg — ABNORMAL HIGH (ref 35.0–45.0)
pH, Arterial: 7.267 — ABNORMAL LOW (ref 7.350–7.450)
pH, Arterial: 7.341 — ABNORMAL LOW (ref 7.350–7.450)
pH, Arterial: 7.379 (ref 7.350–7.450)
pH, Arterial: 7.39 (ref 7.350–7.450)
pH, Arterial: 7.406 (ref 7.350–7.450)
pO2, Arterial: 122 mmHg — ABNORMAL HIGH (ref 80.0–100.0)
pO2, Arterial: 242 mmHg — ABNORMAL HIGH (ref 80.0–100.0)
pO2, Arterial: 59 mmHg — ABNORMAL LOW (ref 80.0–100.0)
pO2, Arterial: 83 mmHg (ref 80.0–100.0)
pO2, Arterial: 94 mmHg (ref 80.0–100.0)

## 2010-08-07 LAB — APTT
aPTT: 30 seconds (ref 24–37)
aPTT: 35 seconds (ref 24–37)

## 2010-08-07 LAB — COMPREHENSIVE METABOLIC PANEL
ALT: 25 U/L (ref 0–53)
AST: 26 U/L (ref 0–37)
Albumin: 3.8 g/dL (ref 3.5–5.2)
Alkaline Phosphatase: 77 U/L (ref 39–117)
BUN: 11 mg/dL (ref 6–23)
CO2: 29 mEq/L (ref 19–32)
Calcium: 9 mg/dL (ref 8.4–10.5)
Chloride: 100 mEq/L (ref 96–112)
Creatinine, Ser: 1.08 mg/dL (ref 0.4–1.5)
GFR calc Af Amer: >60 mL/min (ref >60)
GFR calc non Af Amer: >60 mL/min (ref >60)
Glucose, Bld: 321 mg/dL — ABNORMAL HIGH (ref 70–99)
Potassium: 4.5 mEq/L (ref 3.5–5.1)
Sodium: 136 mEq/L (ref 135–145)
Total Bilirubin: 0.5 mg/dL (ref 0.3–1.2)
Total Protein: 6.3 g/dL (ref 6.0–8.3)

## 2010-08-07 LAB — GLUCOSE, CAPILLARY
Glucose-Capillary: 131 mg/dL — ABNORMAL HIGH (ref 70–99)
Glucose-Capillary: 144 mg/dL — ABNORMAL HIGH (ref 70–99)
Glucose-Capillary: 146 mg/dL — ABNORMAL HIGH (ref 70–99)
Glucose-Capillary: 158 mg/dL — ABNORMAL HIGH (ref 70–99)
Glucose-Capillary: 158 mg/dL — ABNORMAL HIGH (ref 70–99)
Glucose-Capillary: 165 mg/dL — ABNORMAL HIGH (ref 70–99)
Glucose-Capillary: 170 mg/dL — ABNORMAL HIGH (ref 70–99)
Glucose-Capillary: 172 mg/dL — ABNORMAL HIGH (ref 70–99)
Glucose-Capillary: 177 mg/dL — ABNORMAL HIGH (ref 70–99)
Glucose-Capillary: 178 mg/dL — ABNORMAL HIGH (ref 70–99)
Glucose-Capillary: 181 mg/dL — ABNORMAL HIGH (ref 70–99)
Glucose-Capillary: 182 mg/dL — ABNORMAL HIGH (ref 70–99)
Glucose-Capillary: 182 mg/dL — ABNORMAL HIGH (ref 70–99)
Glucose-Capillary: 183 mg/dL — ABNORMAL HIGH (ref 70–99)
Glucose-Capillary: 185 mg/dL — ABNORMAL HIGH (ref 70–99)
Glucose-Capillary: 190 mg/dL — ABNORMAL HIGH (ref 70–99)
Glucose-Capillary: 198 mg/dL — ABNORMAL HIGH (ref 70–99)
Glucose-Capillary: 199 mg/dL — ABNORMAL HIGH (ref 70–99)
Glucose-Capillary: 199 mg/dL — ABNORMAL HIGH (ref 70–99)
Glucose-Capillary: 204 mg/dL — ABNORMAL HIGH (ref 70–99)
Glucose-Capillary: 205 mg/dL — ABNORMAL HIGH (ref 70–99)
Glucose-Capillary: 210 mg/dL — ABNORMAL HIGH (ref 70–99)
Glucose-Capillary: 223 mg/dL — ABNORMAL HIGH (ref 70–99)
Glucose-Capillary: 227 mg/dL — ABNORMAL HIGH (ref 70–99)
Glucose-Capillary: 230 mg/dL — ABNORMAL HIGH (ref 70–99)
Glucose-Capillary: 235 mg/dL — ABNORMAL HIGH (ref 70–99)
Glucose-Capillary: 240 mg/dL — ABNORMAL HIGH (ref 70–99)
Glucose-Capillary: 243 mg/dL — ABNORMAL HIGH (ref 70–99)
Glucose-Capillary: 249 mg/dL — ABNORMAL HIGH (ref 70–99)
Glucose-Capillary: 252 mg/dL — ABNORMAL HIGH (ref 70–99)
Glucose-Capillary: 273 mg/dL — ABNORMAL HIGH (ref 70–99)
Glucose-Capillary: 275 mg/dL — ABNORMAL HIGH (ref 70–99)
Glucose-Capillary: 299 mg/dL — ABNORMAL HIGH (ref 70–99)
Glucose-Capillary: 347 mg/dL — ABNORMAL HIGH (ref 70–99)
Glucose-Capillary: 72 mg/dL (ref 70–99)
Glucose-Capillary: 98 mg/dL (ref 70–99)

## 2010-08-07 LAB — BLOOD GAS, ARTERIAL
Acid-Base Excess: 1.8 mmol/L (ref 0.0–2.0)
Bicarbonate: 26.3 mEq/L — ABNORMAL HIGH (ref 20.0–24.0)
Drawn by: 330991
FIO2: 0.21 %
O2 Saturation: 94.3 %
Patient temperature: 98.6
TCO2: 27.7 mmol/L (ref 0–100)
pCO2 arterial: 44.8 mmHg (ref 35.0–45.0)
pH, Arterial: 7.387 (ref 7.350–7.450)
pO2, Arterial: 70.7 mmHg — ABNORMAL LOW (ref 80.0–100.0)

## 2010-08-07 LAB — CREATININE, SERUM
Creatinine, Ser: 0.86 mg/dL (ref 0.4–1.5)
Creatinine, Ser: 1.13 mg/dL (ref 0.4–1.5)
GFR calc Af Amer: >60 mL/min (ref >60)
GFR calc Af Amer: >60 mL/min (ref >60)
GFR calc non Af Amer: >60 mL/min (ref >60)
GFR calc non Af Amer: >60 mL/min (ref >60)

## 2010-08-07 LAB — CBC
HCT: 31 % — ABNORMAL LOW (ref 39.0–52.0)
HCT: 31.4 % — ABNORMAL LOW (ref 39.0–52.0)
HCT: 32.4 % — ABNORMAL LOW (ref 39.0–52.0)
HCT: 32.9 % — ABNORMAL LOW (ref 39.0–52.0)
HCT: 33.7 % — ABNORMAL LOW (ref 39.0–52.0)
HCT: 40.1 % (ref 39.0–52.0)
HCT: 40.7 % (ref 39.0–52.0)
HCT: 40.7 % (ref 39.0–52.0)
HCT: 41.4 % (ref 39.0–52.0)
Hemoglobin: 10.3 g/dL — ABNORMAL LOW (ref 13.0–17.0)
Hemoglobin: 10.5 g/dL — ABNORMAL LOW (ref 13.0–17.0)
Hemoglobin: 10.6 g/dL — ABNORMAL LOW (ref 13.0–17.0)
Hemoglobin: 11 g/dL — ABNORMAL LOW (ref 13.0–17.0)
Hemoglobin: 11.3 g/dL — ABNORMAL LOW (ref 13.0–17.0)
Hemoglobin: 13.5 g/dL (ref 13.0–17.0)
Hemoglobin: 13.5 g/dL (ref 13.0–17.0)
Hemoglobin: 13.7 g/dL (ref 13.0–17.0)
Hemoglobin: 14 g/dL (ref 13.0–17.0)
MCH: 29.6 pg (ref 26.0–34.0)
MCH: 29.7 pg (ref 26.0–34.0)
MCH: 29.9 pg (ref 26.0–34.0)
MCH: 29.9 pg (ref 26.0–34.0)
MCH: 30.2 pg (ref 26.0–34.0)
MCH: 30.3 pg (ref 26.0–34.0)
MCH: 30.4 pg (ref 26.0–34.0)
MCH: 30.6 pg (ref 26.0–34.0)
MCH: 30.7 pg (ref 26.0–34.0)
MCHC: 32.6 g/dL (ref 30.0–36.0)
MCHC: 32.7 g/dL (ref 30.0–36.0)
MCHC: 33.2 g/dL (ref 30.0–36.0)
MCHC: 33.2 g/dL (ref 30.0–36.0)
MCHC: 33.4 g/dL (ref 30.0–36.0)
MCHC: 33.7 g/dL (ref 30.0–36.0)
MCHC: 33.7 g/dL (ref 30.0–36.0)
MCHC: 33.8 g/dL (ref 30.0–36.0)
MCHC: 34.3 g/dL (ref 30.0–36.0)
MCV: 87.9 fL (ref 78.0–100.0)
MCV: 88.7 fL (ref 78.0–100.0)
MCV: 89.4 fL (ref 78.0–100.0)
MCV: 89.9 fL (ref 78.0–100.0)
MCV: 90.2 fL (ref 78.0–100.0)
MCV: 90.2 fL (ref 78.0–100.0)
MCV: 90.4 fL (ref 78.0–100.0)
MCV: 92.6 fL (ref 78.0–100.0)
MCV: 92.6 fL (ref 78.0–100.0)
Platelets: 101 10*3/uL — ABNORMAL LOW (ref 150–400)
Platelets: 116 10*3/uL — ABNORMAL LOW (ref 150–400)
Platelets: 130 10*3/uL — ABNORMAL LOW (ref 150–400)
Platelets: 131 10*3/uL — ABNORMAL LOW (ref 150–400)
Platelets: 136 10*3/uL — ABNORMAL LOW (ref 150–400)
Platelets: 170 10*3/uL (ref 150–400)
Platelets: 172 10*3/uL (ref 150–400)
Platelets: 180 10*3/uL (ref 150–400)
Platelets: 185 10*3/uL (ref 150–400)
RBC: 3.45 MIL/uL — ABNORMAL LOW (ref 4.22–5.81)
RBC: 3.5 MIL/uL — ABNORMAL LOW (ref 4.22–5.81)
RBC: 3.54 MIL/uL — ABNORMAL LOW (ref 4.22–5.81)
RBC: 3.64 MIL/uL — ABNORMAL LOW (ref 4.22–5.81)
RBC: 3.68 MIL/uL — ABNORMAL LOW (ref 4.22–5.81)
RBC: 4.51 MIL/uL (ref 4.22–5.81)
RBC: 4.51 MIL/uL (ref 4.22–5.81)
RBC: 4.56 MIL/uL (ref 4.22–5.81)
RBC: 4.58 MIL/uL (ref 4.22–5.81)
RDW: 12.5 % (ref 11.5–15.5)
RDW: 12.5 % (ref 11.5–15.5)
RDW: 12.6 % (ref 11.5–15.5)
RDW: 12.6 % (ref 11.5–15.5)
RDW: 12.6 % (ref 11.5–15.5)
RDW: 12.7 % (ref 11.5–15.5)
RDW: 13 % (ref 11.5–15.5)
RDW: 13.3 % (ref 11.5–15.5)
RDW: 13.4 % (ref 11.5–15.5)
WBC: 4 10*3/uL (ref 4.0–10.5)
WBC: 4.2 10*3/uL (ref 4.0–10.5)
WBC: 4.3 10*3/uL (ref 4.0–10.5)
WBC: 4.8 10*3/uL (ref 4.0–10.5)
WBC: 5.7 10*3/uL (ref 4.0–10.5)
WBC: 8.9 10*3/uL (ref 4.0–10.5)
WBC: 9 10*3/uL (ref 4.0–10.5)
WBC: 9.2 10*3/uL (ref 4.0–10.5)
WBC: 9.3 10*3/uL (ref 4.0–10.5)

## 2010-08-07 LAB — BASIC METABOLIC PANEL
BUN: 11 mg/dL (ref 6–23)
BUN: 8 mg/dL (ref 6–23)
BUN: 8 mg/dL (ref 6–23)
BUN: 9 mg/dL (ref 6–23)
CO2: 23 mEq/L (ref 19–32)
CO2: 23 mEq/L (ref 19–32)
CO2: 26 mEq/L (ref 19–32)
CO2: 27 mEq/L (ref 19–32)
Calcium: 8 mg/dL — ABNORMAL LOW (ref 8.4–10.5)
Calcium: 8.5 mg/dL (ref 8.4–10.5)
Calcium: 8.8 mg/dL (ref 8.4–10.5)
Calcium: 9.1 mg/dL (ref 8.4–10.5)
Chloride: 104 mEq/L (ref 96–112)
Chloride: 104 mEq/L (ref 96–112)
Chloride: 105 mEq/L (ref 96–112)
Chloride: 112 mEq/L (ref 96–112)
Creatinine, Ser: 0.84 mg/dL (ref 0.4–1.5)
Creatinine, Ser: 0.87 mg/dL (ref 0.4–1.5)
Creatinine, Ser: 0.89 mg/dL (ref 0.4–1.5)
Creatinine, Ser: 0.89 mg/dL (ref 0.4–1.5)
GFR calc Af Amer: >60 mL/min (ref >60)
GFR calc Af Amer: >60 mL/min (ref >60)
GFR calc Af Amer: >60 mL/min (ref >60)
GFR calc Af Amer: >60 mL/min (ref >60)
GFR calc non Af Amer: >60 mL/min (ref >60)
GFR calc non Af Amer: >60 mL/min (ref >60)
GFR calc non Af Amer: >60 mL/min (ref >60)
GFR calc non Af Amer: >60 mL/min (ref >60)
Glucose, Bld: 165 mg/dL — ABNORMAL HIGH (ref 70–99)
Glucose, Bld: 172 mg/dL — ABNORMAL HIGH (ref 70–99)
Glucose, Bld: 192 mg/dL — ABNORMAL HIGH (ref 70–99)
Glucose, Bld: 220 mg/dL — ABNORMAL HIGH (ref 70–99)
Potassium: 4.1 mEq/L (ref 3.5–5.1)
Potassium: 4.3 mEq/L (ref 3.5–5.1)
Potassium: 4.3 mEq/L (ref 3.5–5.1)
Potassium: 4.7 mEq/L (ref 3.5–5.1)
Sodium: 136 mEq/L (ref 135–145)
Sodium: 137 mEq/L (ref 135–145)
Sodium: 139 mEq/L (ref 135–145)
Sodium: 140 mEq/L (ref 135–145)

## 2010-08-07 LAB — POCT I-STAT 4, (NA,K, GLUC, HGB,HCT)
Glucose, Bld: 115 mg/dL — ABNORMAL HIGH (ref 70–99)
Glucose, Bld: 136 mg/dL — ABNORMAL HIGH (ref 70–99)
Glucose, Bld: 145 mg/dL — ABNORMAL HIGH (ref 70–99)
Glucose, Bld: 153 mg/dL — ABNORMAL HIGH (ref 70–99)
Glucose, Bld: 181 mg/dL — ABNORMAL HIGH (ref 70–99)
Glucose, Bld: 211 mg/dL — ABNORMAL HIGH (ref 70–99)
HCT: 24 % — ABNORMAL LOW (ref 39.0–52.0)
HCT: 26 % — ABNORMAL LOW (ref 39.0–52.0)
HCT: 28 % — ABNORMAL LOW (ref 39.0–52.0)
HCT: 31 % — ABNORMAL LOW (ref 39.0–52.0)
HCT: 36 % — ABNORMAL LOW (ref 39.0–52.0)
HCT: 37 % — ABNORMAL LOW (ref 39.0–52.0)
Hemoglobin: 10.5 g/dL — ABNORMAL LOW (ref 13.0–17.0)
Hemoglobin: 12.2 g/dL — ABNORMAL LOW (ref 13.0–17.0)
Hemoglobin: 12.6 g/dL — ABNORMAL LOW (ref 13.0–17.0)
Hemoglobin: 8.2 g/dL — ABNORMAL LOW (ref 13.0–17.0)
Hemoglobin: 8.8 g/dL — ABNORMAL LOW (ref 13.0–17.0)
Hemoglobin: 9.5 g/dL — ABNORMAL LOW (ref 13.0–17.0)
Potassium: 3.8 mEq/L (ref 3.5–5.1)
Potassium: 4.1 mEq/L (ref 3.5–5.1)
Potassium: 4.3 mEq/L (ref 3.5–5.1)
Potassium: 4.4 mEq/L (ref 3.5–5.1)
Potassium: 4.6 mEq/L (ref 3.5–5.1)
Potassium: 5.4 mEq/L — ABNORMAL HIGH (ref 3.5–5.1)
Sodium: 136 mEq/L (ref 135–145)
Sodium: 136 mEq/L (ref 135–145)
Sodium: 137 mEq/L (ref 135–145)
Sodium: 138 mEq/L (ref 135–145)
Sodium: 139 mEq/L (ref 135–145)
Sodium: 143 mEq/L (ref 135–145)

## 2010-08-07 LAB — URINALYSIS, MICROSCOPIC ONLY
Bilirubin Urine: NEGATIVE
Glucose, UA: NEGATIVE mg/dL
Hgb urine dipstick: NEGATIVE
Ketones, ur: NEGATIVE mg/dL
Leukocytes, UA: NEGATIVE
Nitrite: NEGATIVE
Protein, ur: NEGATIVE mg/dL
Specific Gravity, Urine: 1.014 (ref 1.005–1.030)
Urobilinogen, UA: 1 mg/dL (ref 0.0–1.0)
pH: 5.5 (ref 5.0–8.0)

## 2010-08-07 LAB — LIPID PANEL
Cholesterol: 130 mg/dL (ref 0–200)
HDL: 33 mg/dL — ABNORMAL LOW (ref >39)
LDL Cholesterol: 66 mg/dL (ref 0–99)
Total CHOL/HDL Ratio: 3.9 RATIO
Triglycerides: 156 mg/dL — ABNORMAL HIGH (ref <150)
VLDL: 31 mg/dL (ref 0–40)

## 2010-08-07 LAB — MAGNESIUM
Magnesium: 2.2 mg/dL (ref 1.5–2.5)
Magnesium: 2.4 mg/dL (ref 1.5–2.5)
Magnesium: 3.1 mg/dL — ABNORMAL HIGH (ref 1.5–2.5)

## 2010-08-07 LAB — PLATELET COUNT: Platelets: 122 10*3/uL — ABNORMAL LOW (ref 150–400)

## 2010-08-07 LAB — POCT I-STAT, CHEM 8
BUN: 6 mg/dL (ref 6–23)
Calcium, Ion: 1.17 mmol/L (ref 1.12–1.32)
Chloride: 109 mEq/L (ref 96–112)
Creatinine, Ser: 0.8 mg/dL (ref 0.4–1.5)
Glucose, Bld: 187 mg/dL — ABNORMAL HIGH (ref 70–99)
HCT: 34 % — ABNORMAL LOW (ref 39.0–52.0)
Hemoglobin: 11.6 g/dL — ABNORMAL LOW (ref 13.0–17.0)
Potassium: 5.1 mEq/L (ref 3.5–5.1)
Sodium: 142 mEq/L (ref 135–145)
TCO2: 22 mmol/L (ref 0–100)

## 2010-08-07 LAB — HEMOGLOBIN AND HEMATOCRIT, BLOOD
HCT: 25.7 % — ABNORMAL LOW (ref 39.0–52.0)
Hemoglobin: 8.9 g/dL — ABNORMAL LOW (ref 13.0–17.0)

## 2010-08-07 LAB — HEMOGLOBIN A1C
Hgb A1c MFr Bld: 8.9 % — ABNORMAL HIGH (ref <5.7)
Mean Plasma Glucose: 209 mg/dL — ABNORMAL HIGH (ref <117)

## 2010-08-07 LAB — MRSA PCR SCREENING: MRSA by PCR: NEGATIVE

## 2010-08-07 LAB — HEPARIN LEVEL (UNFRACTIONATED)
Heparin Unfractionated: 0.3 IU/mL (ref 0.30–0.70)
Heparin Unfractionated: 0.31 IU/mL (ref 0.30–0.70)
Heparin Unfractionated: 0.36 IU/mL (ref 0.30–0.70)
Heparin Unfractionated: 0.39 IU/mL (ref 0.30–0.70)

## 2010-08-07 LAB — PROTIME-INR
INR: 0.96 (ref 0.00–1.49)
INR: 1.4 (ref 0.00–1.49)
Prothrombin Time: 13 seconds (ref 11.6–15.2)
Prothrombin Time: 17.4 seconds — ABNORMAL HIGH (ref 11.6–15.2)

## 2010-08-07 LAB — POCT I-STAT GLUCOSE
Glucose, Bld: 183 mg/dL — ABNORMAL HIGH (ref 70–99)
Operator id: 194801

## 2010-08-07 LAB — ABO/RH: ABO/RH(D): O POS

## 2010-08-07 LAB — TYPE AND SCREEN
ABO/RH(D): O POS
Antibody Screen: NEGATIVE

## 2010-10-07 NOTE — Assessment & Plan Note (Signed)
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  He  also was found to have three-vessel disease and underwent coronary  bypass grafting x4 on February 17, 2010.  His postoperative course was  unremarkable.  He was discharged without any complications.  He did get  apparently readmitted to the hospital with some transient atrial  fibrillation a couple of weeks ago but that resolved and he was  discharged home.  He states he has been doing well.  He has not taken a  pain pill in about 3 weeks.  He is walking three miles a day.  He is  very anxious to get back into his full activities.  His blood sugars  have been well controlled in the mornings, but the evenings had been  running very high but until about 3 days ago and they have been running  pretty steadily.  In fact his p.m. glucose last night was 120.   CURRENT MEDICATIONS:  1. Aspirin 81 mg daily.  2. Toprol-XL 25 mg daily.  3. Altace 5 mg daily.  4. Amaryl 4 mg b.i.Hardy.  5. Fish oil 1200 mg daily.  6. Lantus 17 units subcu q.a.m.  7. Metformin 1000 mg b.i.Hardy.  8. Tylenol 325 mg p.r.n.  9. Vitamin C 300 mg daily.  10.Zocor 40 mg at bedtime.   ALLERGIES:  He has no known drug allergies.   PHYSICAL EXAMINATION:  General:  The patient is a well-appearing 63-year-  old gentleman in no acute distress.  Vital Signs:  Blood pressure is  138/73, pulse 64, respirations are 20, oxygen saturation is 90% on room  air.  Neurologic:  He is alert and oriented x3 with no focal deficits.  HEENT:  Unremarkable.  Chest:  His sternum is stable.  His sternal  incision is clean, dry, and intact.  Cardiac:  Regular rate and rhythm.  Normal S1 and S2.  There is a 2/6 systolic murmur which was present  preoperatively.  Lungs:  Clear with equal breath sounds.  Extremities:  Trace edema in the right leg.  Incisions are well healed.   IMAGING:  Chest x-ray shows good aeration of the lungs bilaterally.   IMPRESSION:  The patient is a 65 year old gentleman status post coronary  bypass grafting.  He is now about a month out from the operation.  He is  doing extremely well at this point in time.  He is having minimal  discomfort.  His exercise tolerance is excellent.  He is very anxious to  resume his activities.  I told him that he can gradually over the next  month or so start working himself back into work but not to exceed 1-2  hours a day for the 3-4 hours a day for the second week and so on and  certainly should not try to work full-time for the first of December at  the earliest.  He may begin driving a, car appropriate cautions were  discussed with that as well.  He is not to lift any objects greater than  10  pounds for at least another 2 weeks and anything over 20 pounds for  another 4 weeks.  In terms of going back to referring basketball, I  think he should not attempt that before the first of December and  probably would be better off waiting until around the holidays to start  that.  He will continue to be followed by Dr. Valera Castle and I will be  happy to see him back at anytime in the future that can be of any  further assistance with his care.   Seth Hardy, M.Hardy.  Electronically Signed   SCH/MEDQ  Hardy:  03/25/2010  T:  03/26/2010  Job:  [id number]   cc:   Thomas C. Daleen Squibb, MD, [facility]  Darl Pikes A. Toni Arthurs, DNP, FNP-BC

## 2010-10-07 NOTE — Assessment & Plan Note (Signed)
OFFICE VISIT   Seth Hardy, Seth Hardy  DOB:  [birth year]                                        February 28, 2010  CHART #:  [id number]   The patient called approximately 6 a.m. this morning, February 28, 2010.  He had undergone coronary artery bypass by Dr. Dorris Fetch on February 17, 2010.  This morning, his complaint was that he had trouble sleeping,  took Benadryl, and felt anxious and coming out of my skin.  He had no  fever, chills.  Blood pressure according to his measurement was 120/60.  He has had no wound problems.  His primary question was could he resume  his Ativan which he had been taking preop and would it interfere with  his other medications.  I instructed him not to use any more Benadryl  for sleep as that may have an idiosyncratic reaction to the Benadryl and  that it was okay to resume his previous prescription of Ativan.  The  patient has a followup appointment with Dr. Dorris Fetch next week.  If  he has any further trouble, he will contact the office.   Sheliah Plane, MD  Electronically Signed   EG/MEDQ  Hardy:  02/28/2010  T:  02/28/2010  Job:  [id number]

## 2010-10-07 NOTE — Assessment & Plan Note (Signed)
[facility] OFFICE NOTE   Seth, Hardy                     MRN:          [id number]  DATE:01/17/2008                            DOB:          16-Feb-1946    CHIEF COMPLAINT:  Reestablish with me as his cardiologist, history of an  angioplasty.   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  He had an angioplasty in 1996.  At that time,  he has had no recurrent problems.  I have not seen him in over 5 years.  We have  been unable to find any records even from the [facility].   He follows along fairly closely with Dr. Loma Sender.   He is denying any chest discomfort at present.  He does have some mild  dyspnea on exertion.  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  He enjoys riding a  bike as well.   PAST MEDICAL HISTORY:  He does not smoke.  He never has.  He has some  social alcoholic beverages, otherwise negative.   ALLERGIES:  He is not allergic to dye.  He has no known drug allergies.   CURRENT MEDICATIONS:  1. Metformin 1000 mg p.o. b.i.d.  2. Amaryl 4 mg p.o. b.i.d.  3. Altace 5 mg a day.  4. Zocor 40 mg a day.  5. Aspirin 81 mg a day.  6. Lantus as directed.  7. Vitamin E, C, and Cinnamon.   SURGICAL HISTORY:  He had a traumatic left renal cyst from a football  injury that was removed at age 58, in 54.  Other than, he had the  angioplasty in 1996.   SOCIAL HISTORY:  He has 2 children.  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   FAMILY HISTORY:  Negative for premature coronary artery disease.   REVIEW OF SYSTEMS:  History of a bleeding ulcer was cauterized a couple  years ago.  He has had no further bleeding.  He has some sexual  dysfunction with erectile  dysfunction and was recently evaluated at  [facility].  He has type 2 diabetes.  His last hemoglobin A1c was  above 8%.   He also has a history of hyperlipidemia.  His last LDL was 71, HDL 37.   PHYSICAL EXAMINATION:  VITAL SIGNS:  His blood pressure is 136/74, his  pulse 62 and regular.  He is on 5 feet 8 inches, weighs 182 pounds.  HEENT:  Normocephalic, atraumatic.  PERRLA.  Extraocular ocular  movements intact.  Sclerae are clear except for the right is injected.  He wears glasses.  Facial symmetry is normal.  Dentition is  satisfactory.  NECK:  Supple.  Carotids upstrokes are equal bilaterally with either  referred sounds or bilateral soft bruits.  Thyroid is not enlarged.  Trachea is midline.  LUNGS:  Clear.  HEART:  Reveals regular rate and rhythm.  Systolic murmur along left  sternal border.  S2 seems to split.  There is no diastolic component.  ABDOMEN:  Soft, good bowel sounds.  No midline or pulsatile mass.  No  hepatomegaly.  EXTREMITIES:  No edema.  Pulses are present 2+/4+ throughout.  No sign  of DVT.  NEURO:  Intact.  SKIN:  Unremarkable.   His electrocardiogram demonstrates sinus rhythm with nonspecific ST-  segment changes, inferolaterally.   ASSESSMENT AND PLAN:  [name] is doing well from our standpoint.  He is long overdue objective assessment of his coronaries.  He also has  possible carotid artery disease by exam and probably some aortic  sclerosis or mild stenosis.   PLAN:  1. Carotid Dopplers.  2. Exercise rest/stress Myoview  3. 2-D echocardiogram.   I will schedule him for followup with me in about 3 weeks, so we can  answer all his questions.  I have made no changes in his medical  program.     Jesse Sans. Daleen Squibb, MD, [facility]  Electronically Signed    TCW/MedQ  DD: 01/17/2008  DT: 01/18/2008  Job #: [id number]   cc:   Loma Sender

## 2010-10-07 NOTE — Assessment & Plan Note (Signed)
[facility]                            CARDIOLOGY OFFICE NOTE   GARY, GABRIELSEN                     MRN:          [id number]  DATE:03/14/2008                            DOB:          12-07-1945    Mr. Bee returns today for followup.  He was having some dyspnea on  exertion.  I have not seen him in over 5 years.  We obtained a stress  Myoview which showed excellent exercise tolerance, EF 59% with no sign  of scar or ischemia.  He had a remote PTCA in 1996.  I also heard a  murmur with echo showing mild aortic stenosis with a mean gradient of  10.  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  He is asymptomatic.   I reviewed all these findings at length with Mr. Storlie.  I have  advised him to return to see me in 1 year.  At that time, I will repeat  carotid Dopplers.     Thomas C. Daleen Squibb, MD, [facility]  Electronically Signed    TCW/MedQ  DD: 03/14/2008  DT: 03/15/2008  Job #: [id number]   cc:   Loma Sender

## 2010-10-10 NOTE — H&P (Signed)
Seth Hardy, Seth Hardy              ACCOUNT NO.:  [id number]   MEDICAL RECORD NO.:  [id number]          PATIENT TYPE:  INP   LOCATION:  5003                         FACILITY:  [facility]   PHYSICIAN:  Iva Boop, M.D. LHCDATE OF BIRTH:  03-Feb-1946   DATE OF ADMISSION:  09/23/2004  DATE OF DISCHARGE:                                HISTORY & PHYSICAL   CHIEF COMPLAINT:  Black stools, lightheaded.   HISTORY:  This 65 year old white man felt lightheaded last Thursday,  approximately 5 days ago.  It was transient and was not associated with  other symptoms.  Then yesterday he had one melenic stool and then two more  today.  After a melenic stool this morning and some lightheadedness, he went  to see Dr. Loma Sender, his primary care physician.  In the office, he  was found to have melenic and strongly heme-positive stool.  He was mildly  orthostatic apparently, though his blood pressure was normal overall.  His  hemoglobin was 10.6 per conversation with Dr. Vear Clock that I had.  The  patient takes an aspirin a day because of cardiac issues and diabetes but  uses no other significant nonsteroidals except for an occasional Aleve.  He  has not used that in some time.  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  He describes no chronic indigestion other than  occasional symptoms.  No dysphagia, no other bowel habit changes.  He has  never had a bleeding ulcer.  He takes no other antiplatelet agents.  He is  not really having any abdominal pain, though while speaking to me, he became  nauseous and slightly diaphoretic.   DRUG ALLERGIES:  None known.   PAST MEDICAL HISTORY:  1.  Coronary artery disease with percutaneous transluminal cardiac      angioplasty about 10 years ago.  No history of MI, though.  2.  Diabetes mellitus, type 2.  3.  Dyslipidemia.  4.  Kidney cyst surgery at age  37.   MEDICATIONS:  1.  Altace 10 mg daily.  2.  Amaryl 4 mg daily.  3.  Avandia 4 mg daily.  4.  Metformin 1000 mg b.i.d.  5.  Enteric-coated aspirin 325 mg daily.  6.  Study drug, fenofibrate versus placebo at [facility], coordinated by Dr.      Daron Offer.  7.  Vitamins C, E, multivitamins, kelp, and calcium supplements.   FAMILY HISTORY:  Parents are both alive.  Mother had bladder cancer.  Father  has been okay.  There is no colon cancer.   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  No tobacco.  Only occasional alcohol.  He is to go to [place] in a week on a golf trip  for 10 days.   REVIEW OF SYSTEMS:  As mentioned above.  He uses eyeglasses for reading.  He  thought his long-distance vision was slightly blurry in the last day or so.  He denies any angina.  He has no  significant aches or pains or any other  symptoms at this time on complete Review of Systems.  He does have a history  of skin cancer, however, not mentioned above in the Past Medical History.   PHYSICAL EXAMINATION:  GENERAL:  Middle-aged, overweight white man in no  acute distress.  VITAL SIGNS:  Temperature 98.4, pulse 89, blood pressure 144/78,  respirations 19.  Orthostatic blood pressure:  Supine blood pressure 160/62  with pulse of 69, sitting 147/60 with pulse 72, standing 138/58 with pulse  76.  HEENT:  Eyes anicteric.  Mouth:  Posterior pharynx free of lesions.  NECK:  Supple.  CHEST:  Clear.  HEART:  S1, S2.  There is a 2/6 systolic murmur.  ABDOMEN:  Soft, nontender with blood pressure present.  There is a midline  abdominal scar from previous surgery at age 48.  There is no organomegaly or  mass.  EXTREMITIES:  No edema.  RECTAL:  I have not repeated his rectal exam.  SKIN:  He is pale, and he is mildly diaphoretic.   LABORATORY DATA AND OTHER STUDIES:  CBG 146.  On the CMET, glucose 68,  sodium 133, otherwise normal CMET including BUN and creatinine ratio.  Coags  are normal.   Hemoglobin 10.6, hematocrit 30, platelets 214, white count 4.2.   ASSESSMENT:  1.  Melena with presumed upper gastrointestinal bleed, though BUN and      creatinine ratio is normal.  This could be a right colon bleed or small-      bowel bleed.  2.  Anemia secondary to #1.  3.  Diabetes mellitus, type 2, with some mild hyperglycemia.  4.  Mild hyponatremia.  5.  Mild orthostatic hypotension.  6.  Coronary artery disease with aspirin use but no other nonsteroidals in a      significant fashion.   RECOMMENDATIONS AND PLAN:  1.  EGD tomorrow.  2.  If that is negative, then a colonoscopy would be necessary.  3.  Serial hemoglobin and transfuse if hemoglobin less than 8.5.  I have      reviewed the risks of transfusion with the patient.  Risks include      infection with hepatitis or AIDS as well as transfusion reactions.  4.  Continue to monitor blood sugars.  5.  Hold Altace and diabetic medications while n.p.o.  Hold aspirin as well.      Will hold Altace also given      orthostatic blood pressure.  6.  Hydration with normal saline.   I appreciate the opportunity to care for this patient.      CEG/MEDQ  D:  09/23/2004  T:  09/23/2004  Job:  [id number]   cc:   Loma Sender, M.D.

## 2010-10-10 NOTE — Discharge Summary (Signed)
Seth Hardy, Seth Hardy              ACCOUNT NO.:  [id number]   MEDICAL RECORD NO.:  [id number]          PATIENT TYPE:  INP   LOCATION:  5003                         FACILITY:  [facility]   PHYSICIAN:  Iva Boop, M.D. LHCDATE OF BIRTH:  1946-03-25   DATE OF ADMISSION:  09/23/2004  DATE OF DISCHARGE:  09/25/2004                                 DISCHARGE SUMMARY   ADMISSION DIAGNOSES:  1.  Melena, presumed secondary to upper gastrointestinal bleed.  However,      BUN and creatinine are normal and usually would expect BUN to be      increased in the setting of ongoing upper GI.  Therefore, possibly could      have a right colon or small bowel bleed.  2.  Symptomatic anemia secondary to melena with orthostatic symptoms over      the past few days.  3.  Diabetes mellitus, type 2.  4.  Mild hyponatremia.  5.  Mild orthostatic hypotension secondary to gastrointestinal bleed.  6.  Coronary artery disease with ongoing aspirin use.  Otherwise uses      insignificant amounts of nonsteroidal products.  7.  Dyslipidemia.  8.  History of multiple skin cancers involving the arms, back and face.  9.  Status post kidney cyst surgery at age 73.  62. Lack of colon cancer screening in a 65 year old.   DISCHARGE DIAGNOSES:  1.  Gastrointestinal bleed secondary to gastric ulcers.  2.  Anemia, status post transfusion with two units of packed red blood      cells.  3.  Diabetes mellitus, type 2.   PROCEDURES:  Upper endoscopy by Iva Boop, M.D., on Sep 24, 2004.  This  showed ulcers in the stomach, specifically in the antrum and angularis.  Three ulcers were seen on the angularis.  The largest of these had some  pigmentation at the base, but there was no active bleeding or protuberance.  Antral ulcers measuring 5-10 mm.  There was some nonhemorrhagic duodenitis  noted as well.  CLO biopsy obtained, but results pending at this time.   CONSULTATIONS:  None.   BRIEF HISTORY:  Seth Hardy is a  pleasant, 65 year old, white man who is a  type 2 diabetic.  He was admitted on Tuesday, Sep 23, 2004, for evaluation of  a GI bleed by Iva Boop, M.D.  [name] symptoms began the  previous Thursday with lightheadedness, which was transient.  On Sep 22, 2004, he saw dark stools, which repeated themselves about three times during  the day.  He had another one on the morning of Sep 23, 2004, and he had  recurrent lightheadedness.  He was evaluated midday by his primary  physician, Loma Sender, M.D., at the office.  Stool was melenic and  strongly heme positive.  The hemoglobin was 10.6, according to Dr. Vear Clock,  who contacted Dr. Leone Payor.  The patient does take an aspirin every day, but  otherwise uses occasional Aleve and had not used any in a few months.  The  patient did not describe any chronic GERD symptoms, dysphagia  or prior  history of similar events.  He had never had any ulcers and he did not have  any abdominal pain.  Initially he did not describe any nausea, but once he  arrived at the hospital he did complain of some nausea.  [name]  initial hemoglobin on arrival was 10.6.  He was directly admitted to a  nontelemetry bed with a blood pressure of 160/62 and a pulse of 69.  He was  slightly diaphoretic.  He appeared stable on admission.   HOSPITAL COURSE:  The patient was admitted and allowed a diet of clear  liquids.  He received a 250 mL bolus of normal saline and then normal saline  continued at 150 mL/hr.  Overnight the patient did well.  The orthostatic  symptoms resolved with hydration.   The patient underwent upper endoscopy as described above.  He underwent  serial CBCs which showed a steady decline in the hemoglobin and hematocrit.  The hemoglobin had dipped to 9.4.  The patient had been typed and crossed  for transfusion when he was first admitted.  Because the patient is  scheduled to travel [place] early next week, the decision was made to   transfuse the patient with two units of packed red blood cells in order to  assure that the patient would have no lingering effects from anemia.  In  response to the transfusions of two units, his hemoglobin went from 9.4 up  to 11.6.  Again he maintained resolution of any orthostatic symptoms.   On Sep 23, 2004, the patient did not have any bowel movements.  On the  morning of Sep 24, 2004, he did have a melenic stool, which concerned the  patient's wife.  It was explained that this very likely was old blood within  the GI tract and did not represent active GI bleeding.  Vital signs were  stable.  There was no indications to suggest that the patient had any  ongoing bleeding.  At endoscopy, he had not required any therapeutic  intervention.  A biopsy for CLO testing had been performed.  Again, the  results were pending at the time of this dictation.   On the morning of Sep 24, 2004, the patient was stable and ready for  discharge.  He asked about returning to work and was advised not to return  to work until Dr. Leone Payor approved this.  He had an appointment for followup  with Dr. Leone Payor on Sep 29, 2004, at 9:15 a.m. and he was advised to go to  the [facility] laboratory before that for a blood draw wherein H&H would be  drawn stat.  A discussion as to which medications he should and should not  continue, as well as his current status, occurred between the patient, the  patient's wife and medical Apoorva Bugay.   The patient did have some mild hyponatremia, which resolved with  administration of normal saline.   LABORATORIES:  Hemoglobin initially 10.6, down to 9.4 and measured 11.6 at  discharge.  The hematocrit was 32.9 at discharge.  PT 12.6, INR 1.0 and PTT  of 30.  Sodium initially 133, corrected to 141.  Potassium 4.1.  Glucose  ranged 133-168.  BUN 21, creatinine 0.9.  Total bilirubin 0.6, alkaline phosphatase 59, AST 21, ALT 19.  Albumin 3.9.   DIET:  Advised to continue following a  diabetic, low-fat diet.   DISCHARGE MEDICATIONS:  1.  Altace 10 mg daily.  2.  Protonix 40 mg twice  daily.  3.  Amaryl 4 mg daily.  4.  Avandia 4 mg daily.  5.  Metformin 1000 mg twice daily.  6.  Study drug, fenofibrate versus placebo, once daily.  7.  Vitamin C, kelp, multivitamin and calcium supplements as before.  The      patient was advised to hold vitamin E and to restart this on October 26, 2004.  8.  Zocor 40 mg daily.  9.  For pain control, he was advised to use Tylenol or nonaspirin pain      relievers as needed and to avoid aspirin and      nonsteroidals until he was advised to restart these by Dr. Leone Payor.  The      patient was taking aspirin at 325 mg daily prior to his admission.   CONDITION PATIENT AT DISCHARGE:  Stable and improved.      SG/MEDQ  D:  09/25/2004  T:  09/25/2004  Job:  [id number]   cc:   Loma Sender  [id number]  [place]  [place] [address]  Fax: [phone]   Dr. Daron Offer  [facility]

## 2010-11-12 ENCOUNTER — Encounter: Payer: Self-pay | Admitting: Cardiovascular Disease

## 2011-01-05 ENCOUNTER — Encounter: Payer: Self-pay | Admitting: Cardiology

## 2011-01-29 ENCOUNTER — Encounter: Payer: Self-pay | Admitting: Cardiology

## 2011-01-29 ENCOUNTER — Ambulatory Visit (INDEPENDENT_AMBULATORY_CARE_PROVIDER_SITE_OTHER): Payer: BC Managed Care – PPO | Admitting: Cardiology

## 2011-01-29 VITALS — BP 160/80 | HR 51 | Resp 14 | Ht 68.0 in | Wt 193.0 lb

## 2011-01-29 DIAGNOSIS — I2510 Atherosclerotic heart disease of native coronary artery without angina pectoris: Secondary | ICD-10-CM

## 2011-01-29 DIAGNOSIS — R011 Cardiac murmur, unspecified: Secondary | ICD-10-CM

## 2011-01-29 DIAGNOSIS — I25810 Atherosclerosis of coronary artery bypass graft(s) without angina pectoris: Secondary | ICD-10-CM

## 2011-01-29 DIAGNOSIS — I6529 Occlusion and stenosis of unspecified carotid artery: Secondary | ICD-10-CM

## 2011-01-29 DIAGNOSIS — I359 Nonrheumatic aortic valve disorder, unspecified: Secondary | ICD-10-CM

## 2011-01-29 DIAGNOSIS — I4891 Unspecified atrial fibrillation: Secondary | ICD-10-CM

## 2011-01-29 MED ORDER — NITROGLYCERIN 0.4 MG SL SUBL: 0.4000 mg | SUBLINGUAL_TABLET | SUBLINGUAL | Status: DC | PRN

## 2011-01-29 NOTE — Assessment & Plan Note (Signed)
Stable. Continue secondary prevention. Patient advised to lose 25 pounds and achieve a hemoglobin A1c of less than 7%.

## 2011-01-29 NOTE — Assessment & Plan Note (Signed)
No recurrence after surgery.

## 2011-01-29 NOTE — Assessment & Plan Note (Signed)
Arrange for carotid Dopplers

## 2011-01-29 NOTE — Assessment & Plan Note (Signed)
History of mild aortic stenosis. Repeat echo since it's been a couple years.

## 2011-01-29 NOTE — Patient Instructions (Signed)
Your physician has requested that you have a carotid duplex. This test is an ultrasound of the carotid arteries in your neck. It looks at blood flow through these arteries that supply the brain with blood. Allow one hour for this exam. There are no restrictions or special instructions.  Your physician has requested that you have an echocardiogram. Echocardiography is a painless test that uses sound waves to create images of your heart. It provides your doctor with information about the size and shape of your heart and how well your heart's chambers and valves are working. This procedure takes approximately one hour. There are no restrictions for this procedure.  Your physician recommends that you schedule a follow-up appointment in: 1 year with Dr. Daleen Squibb  Your physician encouraged you to lose weight for better health.  A weight loss of 25 pounds is recommended by your physician

## 2011-01-29 NOTE — Progress Notes (Signed)
HPI [name] returns today for evaluation and management of his coronary artery disease, history of bypass surgery, nonobstructive carotid disease, and mild aortic stenosis.  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 He is having no symptoms of angina, dyspnea, presyncope or syncope.  His last hemoglobin A1c was 7.2%. His weight is up considerably.  EKG shows sinus bradycardia with ST segment changes with T wave inversion in one aVL V4 through V6. This is stable. Past Medical History  Diagnosis Date  . Other dyspnea and respiratory abnormality     on exertion  . Undiagnosed cardiac murmurs   . Other symptoms involving cardiovascular system     bruits     Past Surgical History  Procedure Date  . Cyst l kidney   . Angioplasty     No family history on file.  History   Social History  . Marital Status: Married    Spouse Name: N/A    Number of Children: N/A  . Years of Education: N/A   Occupational History  . Not on file.   Social History Main Topics  . Smoking status: Never Smoker   . Smokeless tobacco: Not on file   Comment: tobacco use - no  . Alcohol Use: Yes  . Drug Use: No  . Sexually Active: Not on file   Other Topics Concern  . Not on file   Social History Narrative   Full time. Gets regular exercise.     No Known Allergies  Current Outpatient Prescriptions  Medication Sig Dispense Refill  . acetaminophen (TYLENOL) 325 MG tablet Take 650 mg by mouth every 6 (six) hours as needed.        Marland Kitchen aspirin 81 MG EC tablet Take 81 mg by mouth daily.        . diphenhydrAMINE (BENADRYL) 25 mg capsule Take 25 mg by mouth every 6 (six) hours as needed.        Marland Kitchen esomeprazole (NEXIUM) 40 MG capsule Take 40 mg by mouth daily before breakfast.        . Exenatide (BYDUREON) 2 MG SUSR Inject into the skin as directed.        Marland Kitchen glimepiride (AMARYL) 4 MG tablet Take 4 mg by mouth 2 (two) times daily.        . insulin glargine (LANTUS) 100 UNIT/ML injection Inject into  the skin. UAD       . metFORMIN (GLUMETZA) 500 MG (MOD) 24 hr tablet Take 500 mg by mouth 2 (two) times daily with a meal.        . metoprolol tartrate (LOPRESSOR) 25 MG tablet Take 25 mg by mouth 2 (two) times daily.        . niacin (NIASPAN) 500 MG CR tablet Take 500 mg by mouth at bedtime.        . nitroGLYCERIN (NITROSTAT) 0.4 MG SL tablet Place 0.4 mg under the tongue every 5 (five) minutes as needed.        . Omega-3 Fatty Acids (FISH OIL) 1200 MG CAPS Take 1 capsule by mouth daily.        . ramipril (ALTACE) 5 MG capsule Take 5 mg by mouth daily.        . simvastatin (ZOCOR) 40 MG tablet Take 40 mg by mouth daily.        . vitamin C (ASCORBIC ACID) 500 MG tablet Take 500 mg by mouth daily.          ROS Negative other than  HPI.   PE General Appearance: well developed, well nourished in no acute distress, obese HEENT: symmetrical face, PERRLA, good dentition  Neck: no JVD, thyromegaly, or adenopathy, trachea midline Chest: symmetric without deformity Cardiac: PMI non-displaced, RRR, normal S1, S2, no gallop, soft systolic murmur, S2 splits, no diastolic component Lung: clear to ausculation and percussion Vascular: all pulses full without bruits  Abdominal: nondistended, nontender, good bowel sounds, no HSM, no bruits Extremities: no cyanosis, clubbing or edema, no sign of DVT, no varicosities  Skin: normal color, no rashes Neuro: alert and oriented x 3, non-focal Pysch: normal affect Filed Vitals:   01/29/11 1427  BP: 160/80  Pulse: 51  Resp: 14  Height: 5\' 8"  (1.727 m)  Weight: 193 lb (87.544 kg)    EKG  Labs and Studies Reviewed.   Lab Results  Component Value Date   WBC 9.3 02/19/2010   HGB 11.0* 02/19/2010   HCT 33.7* 02/19/2010   MCV 92.6 02/19/2010   PLT 131* 02/19/2010      Chemistry      Component Value Date/Time   NA 136 02/19/2010 0845   K 4.7 02/19/2010 0845   CL 104 02/19/2010 0845   CO2 23 02/19/2010 0845   BUN 11 02/19/2010 0845   CREATININE 0.89  02/19/2010 0845      Component Value Date/Time   CALCIUM 8.5 02/19/2010 0845   ALKPHOS 77 02/14/2010 2107   AST 26 02/14/2010 2107   ALT 25 02/14/2010 2107   BILITOT 0.5 02/14/2010 2107       Lab Results  Component Value Date   CHOL  Value: 130        ATP III CLASSIFICATION:  <200     mg/dL   Desirable  161-096  mg/dL   Borderline High  >=045    mg/dL   High        08/31/8117   Lab Results  Component Value Date   HDL 33* 02/15/2010   Lab Results  Component Value Date   LDLCALC  Value: 66        Total Cholesterol/HDL:CHD Risk Coronary Heart Disease Risk Table                     Men   Women  1/2 Average Risk   3.4   3.3  Average Risk       5.0   4.4  2 X Average Risk   9.6   7.1  3 X Average Risk  23.4   11.0        Use the calculated Patient Ratio above and the CHD Risk Table to determine the patient's CHD Risk.        ATP III CLASSIFICATION (LDL):  <100     mg/dL   Optimal  147-829  mg/dL   Near or Above                    Optimal  130-159  mg/dL   Borderline  562-130  mg/dL   High  >865     mg/dL   Very High 7/84/6962   Lab Results  Component Value Date   TRIG 156* 02/15/2010   Lab Results  Component Value Date   CHOLHDL 3.9 02/15/2010   Lab Results  Component Value Date   HGBA1C  Value: 8.9 (NOTE)  According to the ADA Clinical Practice Recommendations for 2011, when HbA1c is used as a screening test:   >=6.5%   Diagnostic of Diabetes Mellitus           (if abnormal result  is confirmed)  5.7-6.4%   Increased risk of developing Diabetes Mellitus  References:Diagnosis and Classification of Diabetes Mellitus,Diabetes Care,2011,34(Suppl 1):S62-S69 and Standards of Medical Care in         Diabetes - 2011,Diabetes Care,2011,34  (Suppl 1):S11-S61.* 02/15/2010   Lab Results  Component Value Date   ALT 25 02/14/2010   AST 26 02/14/2010   ALKPHOS 77 02/14/2010   BILITOT 0.5 02/14/2010   No results found for this basename: TSH

## 2011-02-11 ENCOUNTER — Encounter: Payer: Self-pay | Admitting: *Deleted

## 2011-02-20 ENCOUNTER — Other Ambulatory Visit (HOSPITAL_COMMUNITY): Payer: BC Managed Care – PPO | Admitting: Radiology

## 2011-02-20 ENCOUNTER — Encounter: Payer: BC Managed Care – PPO | Admitting: *Deleted

## 2011-02-23 ENCOUNTER — Other Ambulatory Visit: Payer: Self-pay | Admitting: Cardiology

## 2011-03-06 ENCOUNTER — Encounter: Payer: BC Managed Care – PPO | Admitting: *Deleted

## 2011-03-06 ENCOUNTER — Other Ambulatory Visit (HOSPITAL_COMMUNITY): Payer: BC Managed Care – PPO | Admitting: Radiology

## 2011-04-07 ENCOUNTER — Encounter (INDEPENDENT_AMBULATORY_CARE_PROVIDER_SITE_OTHER): Payer: Medicare Other | Admitting: *Deleted

## 2011-04-07 ENCOUNTER — Ambulatory Visit (HOSPITAL_COMMUNITY): Payer: Medicare Other | Attending: Cardiology | Admitting: Radiology

## 2011-04-07 DIAGNOSIS — I079 Rheumatic tricuspid valve disease, unspecified: Secondary | ICD-10-CM | POA: Insufficient documentation

## 2011-04-07 DIAGNOSIS — R011 Cardiac murmur, unspecified: Secondary | ICD-10-CM | POA: Insufficient documentation

## 2011-04-07 DIAGNOSIS — I6529 Occlusion and stenosis of unspecified carotid artery: Secondary | ICD-10-CM

## 2011-04-07 DIAGNOSIS — I379 Nonrheumatic pulmonary valve disorder, unspecified: Secondary | ICD-10-CM | POA: Insufficient documentation

## 2011-04-07 DIAGNOSIS — I2510 Atherosclerotic heart disease of native coronary artery without angina pectoris: Secondary | ICD-10-CM | POA: Insufficient documentation

## 2011-04-07 DIAGNOSIS — I359 Nonrheumatic aortic valve disorder, unspecified: Secondary | ICD-10-CM

## 2011-04-07 DIAGNOSIS — I4891 Unspecified atrial fibrillation: Secondary | ICD-10-CM | POA: Insufficient documentation

## 2012-02-01 ENCOUNTER — Ambulatory Visit (INDEPENDENT_AMBULATORY_CARE_PROVIDER_SITE_OTHER): Payer: Medicare Other | Admitting: Cardiology

## 2012-02-01 ENCOUNTER — Encounter: Payer: Self-pay | Admitting: Cardiology

## 2012-02-01 VITALS — BP 140/62 | HR 45 | Ht 68.0 in | Wt 190.0 lb

## 2012-02-01 DIAGNOSIS — I359 Nonrheumatic aortic valve disorder, unspecified: Secondary | ICD-10-CM

## 2012-02-01 DIAGNOSIS — I779 Disorder of arteries and arterioles, unspecified: Secondary | ICD-10-CM

## 2012-02-01 DIAGNOSIS — I25810 Atherosclerosis of coronary artery bypass graft(s) without angina pectoris: Secondary | ICD-10-CM

## 2012-02-01 DIAGNOSIS — I10 Essential (primary) hypertension: Secondary | ICD-10-CM

## 2012-02-01 DIAGNOSIS — I2510 Atherosclerotic heart disease of native coronary artery without angina pectoris: Secondary | ICD-10-CM

## 2012-02-01 DIAGNOSIS — I350 Nonrheumatic aortic (valve) stenosis: Secondary | ICD-10-CM

## 2012-02-01 MED ORDER — RAMIPRIL 10 MG PO CAPS: 10.0000 mg | ORAL_CAPSULE | Freq: Every day | ORAL | Status: AC

## 2012-02-01 MED ORDER — NITROGLYCERIN 0.4 MG SL SUBL: 0.4000 mg | SUBLINGUAL_TABLET | SUBLINGUAL | Status: DC | PRN

## 2012-02-01 NOTE — Patient Instructions (Addendum)
Your physician has requested that you have a carotid duplex. This test is an ultrasound of the carotid arteries in your neck. It looks at blood flow through these arteries that supply the brain with blood. Allow one hour for this exam. There are no restrictions or special instructions. Schedule in November  Your physician has requested that you have an echocardiogram. Echocardiography is a painless test that uses sound waves to create images of your heart. It provides your doctor with information about the size and shape of your heart and how well your heart's chambers and valves are working. This procedure takes approximately one hour. There are no restrictions for this procedure. Schedule in November  BMET when you return for tests in November.  Increase Altace to 10mg  every day.  Your physician wants you to follow-up in: 12 months You will receive a reminder letter in the mail two months in advance. If you don't receive a letter, please call our office to schedule the follow-up appointment.

## 2012-02-01 NOTE — Assessment & Plan Note (Signed)
Repeat carotid Dopplers. Continue secondary preventative therapy.

## 2012-02-01 NOTE — Assessment & Plan Note (Signed)
Repeat echocardiogram. Followup in one year.

## 2012-02-01 NOTE — Progress Notes (Signed)
HPI [name] returns today for evaluation and management of his history of coronary artery disease, history bypass surgery, mild aortic stenosis, and nonobstructive carotid disease. He has hypertension as well as hyperlipidemia and type 2 diabetes. His last hemoglobin A1c was 6.9%. This is markedly improved from last year. In addition, his blood pressures been running around 140 systolic.  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 He is having no angina or dyspnea presyncope or syncope. He denies orthopnea or PND. He's had no edema.  Past Medical History  Diagnosis Date  . Other dyspnea and respiratory abnormality     on exertion  . Undiagnosed cardiac murmurs   . Other symptoms involving cardiovascular system     bruits     Current Outpatient Prescriptions  Medication Sig Dispense Refill  . acetaminophen (TYLENOL) 325 MG tablet Take 650 mg by mouth every 6 (six) hours as needed.        Marland Kitchen aspirin 81 MG EC tablet Take 81 mg by mouth daily.        . diphenhydrAMINE (BENADRYL) 25 mg capsule Take 25 mg by mouth every 6 (six) hours as needed.        Marland Kitchen esomeprazole (NEXIUM) 40 MG capsule Take 40 mg by mouth daily before breakfast.        . glimepiride (AMARYL) 4 MG tablet Take 4 mg by mouth 2 (two) times daily.        . insulin glargine (LANTUS) 100 UNIT/ML injection Inject into the skin. UAD       . insulin lispro (HUMALOG) 100 UNIT/ML injection Inject 8 Units into the skin 2 (two) times daily. Plus sliding scale      . metFORMIN (GLUMETZA) 500 MG (MOD) 24 hr tablet Take 500 mg by mouth 2 (two) times daily with a meal.        . metoprolol tartrate (LOPRESSOR) 25 MG tablet TAKE ONE TABLET BY MOUTH TWICE DAILY  60 tablet  11  . nitroGLYCERIN (NITROSTAT) 0.4 MG SL tablet Place 1 tablet (0.4 mg total) under the tongue every 5 (five) minutes as needed.  25 tablet  11  . Omega-3 Fatty Acids (FISH OIL) 1200 MG CAPS Take 1 capsule by mouth daily.        . ramipril (ALTACE) 5 MG capsule Take  5 mg by mouth daily.        . simvastatin (ZOCOR) 40 MG tablet Take 40 mg by mouth daily.        . vitamin C (ASCORBIC ACID) 500 MG tablet Take 500 mg by mouth daily.          No Known Allergies  No family history on file.  History   Social History  . Marital Status: Married    Spouse Name: N/A    Number of Children: N/A  . Years of Education: N/A   Occupational History  . Not on file.   Social History Main Topics  . Smoking status: Never Smoker   . Smokeless tobacco: Not on file   Comment: tobacco use - no  . Alcohol Use: Yes  . Drug Use: No  . Sexually Active: Not on file   Other Topics Concern  . Not on file   Social History Narrative   Full time. Gets regular exercise.     ROS ALL NEGATIVE EXCEPT THOSE NOTED IN HPI  PE  General Appearance: well developed, well nourished in no acute distress, overweight HEENT: symmetrical face, PERRLA, good dentition  Neck: no  JVD, thyromegaly, or adenopathy, trachea midline Chest: symmetric without deformity Cardiac: PMI non-displaced, RRR, normal S1, S2, no gallop, 2/6 systolic murmur, S2 splits but difficult to hear Lung: clear to ausculation and percussion Vascular: all pulses full, bilateral carotid bruits  Abdominal: nondistended, nontender, good bowel sounds, no HSM, no bruits Extremities: no cyanosis, clubbing or edema, no sign of DVT, no varicosities  Skin: normal color, no rashes Neuro: alert and oriented x 3, non-focal Pysch: normal affect  EKG Sinus bradycardia, rate 45 beats per minute, ST segment changes laterally, no acute changes. BMET    Component Value Date/Time   NA 136 02/19/2010 0845   K 4.7 02/19/2010 0845   CL 104 02/19/2010 0845   CO2 23 02/19/2010 0845   GLUCOSE 220* 02/19/2010 0845   BUN 11 02/19/2010 0845   CREATININE 0.89 02/19/2010 0845   CALCIUM 8.5 02/19/2010 0845   GFRNONAA >60 02/19/2010 0845   GFRAA  Value: >60        The eGFR has been calculated using the MDRD equation. This calculation  has not been validated in all clinical situations. eGFR's persistently <60 mL/min signify possible Chronic Kidney Disease. 02/19/2010 0845    Lipid Panel     Component Value Date/Time   CHOL  Value: 130        ATP III CLASSIFICATION:  <200     mg/dL   Desirable  960-454  mg/dL   Borderline High  >=098    mg/dL   High        06/12/1476 0458   TRIG 156* 02/15/2010 0458   HDL 33* 02/15/2010 0458   CHOLHDL 3.9 02/15/2010 0458   VLDL 31 02/15/2010 0458   LDLCALC  Value: 66        Total Cholesterol/HDL:CHD Risk Coronary Heart Disease Risk Table                     Men   Women  1/2 Average Risk   3.4   3.3  Average Risk       5.0   4.4  2 X Average Risk   9.6   7.1  3 X Average Risk  23.4   11.0        Use the calculated Patient Ratio above and the CHD Risk Table to determine the patient's CHD Risk.        ATP III CLASSIFICATION (LDL):  <100     mg/dL   Optimal  295-621  mg/dL   Near or Above                    Optimal  130-159  mg/dL   Borderline  308-657  mg/dL   High  >846     mg/dL   Very High 9/62/9528 4132    CBC    Component Value Date/Time   WBC 9.3 02/19/2010 0845   RBC 3.64* 02/19/2010 0845   HGB 11.0* 02/19/2010 0845   HCT 33.7* 02/19/2010 0845   PLT 131* 02/19/2010 0845   MCV 92.6 02/19/2010 0845   MCH 30.2 02/19/2010 0845   MCHC 32.6 02/19/2010 0845   RDW 13.3 02/19/2010 0845   LYMPHSABS 1.6 02/13/2010 0000   MONOABS 0.4 02/13/2010 0000   EOSABS 0.1 02/13/2010 0000   BASOSABS 0.0 02/13/2010 0000

## 2012-02-01 NOTE — Assessment & Plan Note (Signed)
Stable. No change in secondary preventative therapy. The office in one year.

## 2012-02-23 DIAGNOSIS — I219 Acute myocardial infarction, unspecified: Secondary | ICD-10-CM

## 2012-02-23 HISTORY — DX: Acute myocardial infarction, unspecified: I21.9

## 2012-03-02 ENCOUNTER — Other Ambulatory Visit: Payer: Self-pay | Admitting: *Deleted

## 2012-03-02 ENCOUNTER — Other Ambulatory Visit: Payer: Self-pay | Admitting: Cardiology

## 2012-03-02 MED ORDER — METOPROLOL TARTRATE 25 MG PO TABS: 25.0000 mg | ORAL_TABLET | Freq: Two times a day (BID) | ORAL | Status: DC

## 2012-03-02 NOTE — Telephone Encounter (Signed)
Refilled Metoprolol

## 2012-03-08 ENCOUNTER — Emergency Department (HOSPITAL_COMMUNITY): Payer: Medicare Other

## 2012-03-08 ENCOUNTER — Inpatient Hospital Stay (HOSPITAL_COMMUNITY)
Admission: EM | Admit: 2012-03-08 | Discharge: 2012-03-10 | DRG: 249 | Disposition: A | Discharge status: Home / Self Care | Payer: Medicare Other | Attending: Cardiology | Admitting: Cardiology

## 2012-03-08 ENCOUNTER — Encounter (HOSPITAL_COMMUNITY): Payer: Self-pay | Admitting: Emergency Medicine

## 2012-03-08 ENCOUNTER — Encounter (HOSPITAL_COMMUNITY)
Admission: EM | Disposition: A | Discharge status: Home / Self Care | Payer: Self-pay | Source: Home / Self Care | Attending: Cardiology

## 2012-03-08 DIAGNOSIS — I2510 Atherosclerotic heart disease of native coronary artery without angina pectoris: Secondary | ICD-10-CM | POA: Diagnosis present

## 2012-03-08 DIAGNOSIS — I4891 Unspecified atrial fibrillation: Secondary | ICD-10-CM | POA: Diagnosis present

## 2012-03-08 DIAGNOSIS — I359 Nonrheumatic aortic valve disorder, unspecified: Secondary | ICD-10-CM | POA: Diagnosis present

## 2012-03-08 DIAGNOSIS — I200 Unstable angina: Secondary | ICD-10-CM

## 2012-03-08 DIAGNOSIS — I6529 Occlusion and stenosis of unspecified carotid artery: Secondary | ICD-10-CM | POA: Diagnosis present

## 2012-03-08 DIAGNOSIS — I214 Non-ST elevation (NSTEMI) myocardial infarction: Principal | ICD-10-CM | POA: Diagnosis present

## 2012-03-08 DIAGNOSIS — I25810 Atherosclerosis of coronary artery bypass graft(s) without angina pectoris: Secondary | ICD-10-CM | POA: Diagnosis present

## 2012-03-08 DIAGNOSIS — E785 Hyperlipidemia, unspecified: Secondary | ICD-10-CM | POA: Diagnosis present

## 2012-03-08 DIAGNOSIS — Z7982 Long term (current) use of aspirin: Secondary | ICD-10-CM

## 2012-03-08 DIAGNOSIS — E669 Obesity, unspecified: Secondary | ICD-10-CM | POA: Diagnosis present

## 2012-03-08 DIAGNOSIS — Z79899 Other long term (current) drug therapy: Secondary | ICD-10-CM

## 2012-03-08 DIAGNOSIS — Z794 Long term (current) use of insulin: Secondary | ICD-10-CM

## 2012-03-08 DIAGNOSIS — Z23 Encounter for immunization: Secondary | ICD-10-CM

## 2012-03-08 DIAGNOSIS — I658 Occlusion and stenosis of other precerebral arteries: Secondary | ICD-10-CM | POA: Diagnosis present

## 2012-03-08 DIAGNOSIS — Z955 Presence of coronary angioplasty implant and graft: Secondary | ICD-10-CM

## 2012-03-08 DIAGNOSIS — E119 Type 2 diabetes mellitus without complications: Secondary | ICD-10-CM | POA: Diagnosis present

## 2012-03-08 DIAGNOSIS — I10 Essential (primary) hypertension: Secondary | ICD-10-CM | POA: Diagnosis present

## 2012-03-08 HISTORY — DX: Cardiac arrhythmia, unspecified: I49.9

## 2012-03-08 HISTORY — DX: Cardiac murmur, unspecified: R01.1

## 2012-03-08 HISTORY — DX: Hyperlipidemia, unspecified: E78.5

## 2012-03-08 HISTORY — DX: Nonrheumatic aortic (valve) stenosis: I35.0

## 2012-03-08 HISTORY — DX: Peripheral vascular disease, unspecified: I73.9

## 2012-03-08 HISTORY — DX: Disorder of arteries and arterioles, unspecified: I77.9

## 2012-03-08 HISTORY — DX: Gastro-esophageal reflux disease without esophagitis: K21.9

## 2012-03-08 HISTORY — DX: Paroxysmal atrial fibrillation: I48.0

## 2012-03-08 HISTORY — DX: Atherosclerotic heart disease of native coronary artery without angina pectoris: I25.10

## 2012-03-08 HISTORY — DX: Type 2 diabetes mellitus without complications: E11.9

## 2012-03-08 HISTORY — DX: Obesity, unspecified: E66.9

## 2012-03-08 LAB — CBC
HCT: 42.3 % (ref 39.0–52.0)
Hemoglobin: 13.9 g/dL (ref 13.0–17.0)
MCH: 30.1 pg (ref 26.0–34.0)
MCHC: 32.9 g/dL (ref 30.0–36.0)
MCV: 91.6 fL (ref 78.0–100.0)
Platelets: 232 10*3/uL (ref 150–400)
RBC: 4.62 MIL/uL (ref 4.22–5.81)
RDW: 12.7 % (ref 11.5–15.5)
WBC: 7.2 10*3/uL (ref 4.0–10.5)

## 2012-03-08 LAB — POCT I-STAT TROPONIN I: Troponin i, poc: 0 ng/mL (ref 0.00–0.08)

## 2012-03-08 LAB — COMPREHENSIVE METABOLIC PANEL
ALT: 33 U/L (ref 0–53)
AST: 40 U/L — ABNORMAL HIGH (ref 0–37)
Albumin: 4.1 g/dL (ref 3.5–5.2)
Alkaline Phosphatase: 82 U/L (ref 39–117)
BUN: 12 mg/dL (ref 6–23)
CO2: 26 mEq/L (ref 19–32)
Calcium: 9.4 mg/dL (ref 8.4–10.5)
Chloride: 100 mEq/L (ref 96–112)
Creatinine, Ser: 0.82 mg/dL (ref 0.50–1.35)
GFR calc Af Amer: >90 mL/min (ref >90)
GFR calc non Af Amer: >90 mL/min (ref >90)
Glucose, Bld: 143 mg/dL — ABNORMAL HIGH (ref 70–99)
Potassium: 4.6 mEq/L (ref 3.5–5.1)
Sodium: 138 mEq/L (ref 135–145)
Total Bilirubin: 0.4 mg/dL (ref 0.3–1.2)
Total Protein: 7.1 g/dL (ref 6.0–8.3)

## 2012-03-08 LAB — APTT: aPTT: 30 seconds (ref 24–37)

## 2012-03-08 LAB — TROPONIN I
Troponin I: <0.3 ng/mL (ref <0.30)
Troponin I: 1.63 ng/mL (ref <0.30)
Troponin I: 2.77 ng/mL (ref <0.30)

## 2012-03-08 LAB — POCT I-STAT, CHEM 8
BUN: 15 mg/dL (ref 6–23)
Calcium, Ion: 1.17 mmol/L (ref 1.13–1.30)
Chloride: 101 mEq/L (ref 96–112)
Creatinine, Ser: 1 mg/dL (ref 0.50–1.35)
Glucose, Bld: 138 mg/dL — ABNORMAL HIGH (ref 70–99)
HCT: 43 % (ref 39.0–52.0)
Hemoglobin: 14.6 g/dL (ref 13.0–17.0)
Potassium: 4.5 mEq/L (ref 3.5–5.1)
Sodium: 141 mEq/L (ref 135–145)
TCO2: 27 mmol/L (ref 0–100)

## 2012-03-08 LAB — MRSA PCR SCREENING: MRSA by PCR: NEGATIVE

## 2012-03-08 LAB — GLUCOSE, CAPILLARY
Glucose-Capillary: 129 mg/dL — ABNORMAL HIGH (ref 70–99)
Glucose-Capillary: 214 mg/dL — ABNORMAL HIGH (ref 70–99)
Glucose-Capillary: 162 mg/dL — ABNORMAL HIGH (ref 70–99)

## 2012-03-08 LAB — MAGNESIUM: Magnesium: 1.8 mg/dL (ref 1.5–2.5)

## 2012-03-08 LAB — PROTIME-INR
INR: 0.96 (ref 0.00–1.49)
Prothrombin Time: 12.7 seconds (ref 11.6–15.2)

## 2012-03-08 LAB — HEPARIN LEVEL (UNFRACTIONATED): Heparin Unfractionated: <0.1 IU/mL — ABNORMAL LOW (ref 0.30–0.70)

## 2012-03-08 SURGERY — LEFT HEART CATHETERIZATION WITH CORONARY ANGIOGRAM
Anesthesia: LOCAL

## 2012-03-08 MED ORDER — TRAMADOL HCL 50 MG PO TABS: 50.0000 mg | ORAL_TABLET | Freq: Two times a day (BID) | ORAL | Status: DC

## 2012-03-08 MED ORDER — NITROGLYCERIN 0.4 MG SL SUBL: 0.4000 mg | SUBLINGUAL_TABLET | SUBLINGUAL | Status: DC | PRN

## 2012-03-08 MED ORDER — PANTOPRAZOLE SODIUM 40 MG PO TBEC
40.0000 mg | DELAYED_RELEASE_TABLET | Freq: Every day | ORAL | Status: DC
  Administered 2012-03-10: 40 mg via ORAL
  Filled 2012-03-08: qty 1

## 2012-03-08 MED ORDER — ATORVASTATIN CALCIUM 20 MG PO TABS
20.0000 mg | ORAL_TABLET | Freq: Every day | ORAL | Status: DC
  Administered 2012-03-08 – 2012-03-09 (×2): 20 mg via ORAL
  Filled 2012-03-08 (×3): qty 1

## 2012-03-08 MED ORDER — ASPIRIN 81 MG PO CHEW: 324.0000 mg | CHEWABLE_TABLET | Freq: Once | ORAL | Status: DC

## 2012-03-08 MED ORDER — ONDANSETRON HCL 4 MG/2ML IJ SOLN
4.0000 mg | Freq: Four times a day (QID) | INTRAMUSCULAR | Status: DC | PRN
  Administered 2012-03-10: 4 mg via INTRAVENOUS
  Filled 2012-03-08: qty 2

## 2012-03-08 MED ORDER — ACETAMINOPHEN 325 MG PO TABS: 650.0000 mg | ORAL_TABLET | Freq: Four times a day (QID) | ORAL | Status: DC | PRN

## 2012-03-08 MED ORDER — SODIUM CHLORIDE 0.9 % IV SOLN: INTRAVENOUS | Status: DC

## 2012-03-08 MED ORDER — TRAMADOL HCL 50 MG PO TABS
50.0000 mg | ORAL_TABLET | Freq: Two times a day (BID) | ORAL | Status: DC | PRN
  Administered 2012-03-08: 50 mg via ORAL
  Filled 2012-03-08 (×2): qty 1

## 2012-03-08 MED ORDER — ASPIRIN 81 MG PO CHEW
324.0000 mg | CHEWABLE_TABLET | Freq: Once | ORAL | Status: AC
  Administered 2012-03-08: 243 mg via ORAL
  Filled 2012-03-08: qty 3

## 2012-03-08 MED ORDER — HEPARIN SODIUM (PORCINE) 5000 UNIT/ML IJ SOLN
4000.0000 [IU] | INTRAMUSCULAR | Status: AC
  Administered 2012-03-08: 4000 [IU] via INTRAVENOUS
  Filled 2012-03-08: qty 1

## 2012-03-08 MED ORDER — PNEUMOCOCCAL VAC POLYVALENT 25 MCG/0.5ML IJ INJ
0.5000 mL | INJECTION | INTRAMUSCULAR | Status: AC
  Filled 2012-03-08: qty 0.5

## 2012-03-08 MED ORDER — AMIODARONE HCL IN DEXTROSE 360-4.14 MG/200ML-% IV SOLN
30.0000 mg/h | INTRAVENOUS | Status: DC
  Filled 2012-03-08: qty 200

## 2012-03-08 MED ORDER — DILTIAZEM HCL 100 MG IV SOLR
5.0000 mg/h | INTRAVENOUS | Status: DC
  Filled 2012-03-08: qty 100

## 2012-03-08 MED ORDER — METOPROLOL TARTRATE 25 MG PO TABS
25.0000 mg | ORAL_TABLET | Freq: Two times a day (BID) | ORAL | Status: DC
  Administered 2012-03-08 – 2012-03-10 (×2): 25 mg via ORAL
  Filled 2012-03-08 (×5): qty 1

## 2012-03-08 MED ORDER — AMIODARONE HCL IN DEXTROSE 360-4.14 MG/200ML-% IV SOLN
60.0000 mg/h | INTRAVENOUS | Status: DC
  Filled 2012-03-08: qty 200

## 2012-03-08 MED ORDER — OMEGA-3-ACID ETHYL ESTERS 1 G PO CAPS
1.0000 g | ORAL_CAPSULE | Freq: Every day | ORAL | Status: DC
  Administered 2012-03-10: 1 g via ORAL
  Filled 2012-03-08 (×2): qty 1

## 2012-03-08 MED ORDER — RAMIPRIL 10 MG PO CAPS
10.0000 mg | ORAL_CAPSULE | Freq: Every day | ORAL | Status: DC
  Administered 2012-03-09 – 2012-03-10 (×2): 10 mg via ORAL
  Filled 2012-03-08 (×2): qty 1

## 2012-03-08 MED ORDER — AMIODARONE LOAD VIA INFUSION
150.0000 mg | Freq: Once | INTRAVENOUS | Status: AC
  Administered 2012-03-08: 150 mg via INTRAVENOUS
  Filled 2012-03-08: qty 83.34

## 2012-03-08 MED ORDER — HYDROCODONE-ACETAMINOPHEN 7.5-325 MG PO TABS
1.0000 | ORAL_TABLET | ORAL | Status: DC | PRN
  Administered 2012-03-08 – 2012-03-10 (×6): 1 via ORAL
  Filled 2012-03-08 (×6): qty 1

## 2012-03-08 MED ORDER — REGADENOSON 0.4 MG/5ML IV SOLN
0.4000 mg | Freq: Once | INTRAVENOUS | Status: DC
  Filled 2012-03-08: qty 5

## 2012-03-08 MED ORDER — HEPARIN SODIUM (PORCINE) 5000 UNIT/ML IJ SOLN
2000.0000 [IU] | INTRAMUSCULAR | Status: AC
  Administered 2012-03-08: 2000 [IU] via INTRAVENOUS
  Filled 2012-03-08: qty 0.4

## 2012-03-08 MED ORDER — AMOXICILLIN 500 MG PO CAPS
500.0000 mg | ORAL_CAPSULE | Freq: Four times a day (QID) | ORAL | Status: AC
  Administered 2012-03-08 – 2012-03-09 (×5): 500 mg via ORAL
  Filled 2012-03-08 (×6): qty 1

## 2012-03-08 MED ORDER — METOPROLOL TARTRATE 1 MG/ML IV SOLN
5.0000 mg | INTRAVENOUS | Status: AC
  Administered 2012-03-08: 5 mg via INTRAVENOUS
  Filled 2012-03-08 (×2): qty 5

## 2012-03-08 MED ORDER — INSULIN ASPART 100 UNIT/ML ~~LOC~~ SOLN
8.0000 [IU] | Freq: Two times a day (BID) | SUBCUTANEOUS | Status: DC
  Administered 2012-03-08 – 2012-03-10 (×3): 8 [IU] via SUBCUTANEOUS

## 2012-03-08 MED ORDER — HEPARIN (PORCINE) IN NACL 100-0.45 UNIT/ML-% IJ SOLN
1500.0000 [IU]/h | INTRAMUSCULAR | Status: DC
  Administered 2012-03-08: 1100 [IU]/h via INTRAVENOUS
  Administered 2012-03-09: 1500 [IU]/h via INTRAVENOUS
  Filled 2012-03-08 (×4): qty 250

## 2012-03-08 MED ORDER — GLIMEPIRIDE 4 MG PO TABS
4.0000 mg | ORAL_TABLET | Freq: Two times a day (BID) | ORAL | Status: DC
  Administered 2012-03-09 – 2012-03-10 (×2): 4 mg via ORAL
  Filled 2012-03-08 (×5): qty 1

## 2012-03-08 MED ORDER — ALPRAZOLAM 0.25 MG PO TABS: 0.2500 mg | ORAL_TABLET | Freq: Two times a day (BID) | ORAL | Status: DC | PRN

## 2012-03-08 MED ORDER — VITAMIN C 500 MG PO TABS
500.0000 mg | ORAL_TABLET | Freq: Every day | ORAL | Status: DC
  Administered 2012-03-10: 500 mg via ORAL
  Filled 2012-03-08 (×2): qty 1

## 2012-03-08 MED ORDER — ASPIRIN EC 81 MG PO TBEC
81.0000 mg | DELAYED_RELEASE_TABLET | Freq: Every day | ORAL | Status: DC
  Filled 2012-03-08: qty 1

## 2012-03-08 MED ORDER — ONDANSETRON HCL 4 MG/2ML IJ SOLN
INTRAMUSCULAR | Status: AC
  Administered 2012-03-08: 12:00:00
  Filled 2012-03-08: qty 2

## 2012-03-08 MED ORDER — ZOLPIDEM TARTRATE 5 MG PO TABS: 5.0000 mg | ORAL_TABLET | Freq: Every evening | ORAL | Status: DC | PRN

## 2012-03-08 MED ORDER — SIMVASTATIN 40 MG PO TABS: 40.0000 mg | ORAL_TABLET | Freq: Every day | ORAL | Status: DC

## 2012-03-08 MED ORDER — DILTIAZEM HCL 25 MG/5ML IV SOLN
10.0000 mg | Freq: Once | INTRAVENOUS | Status: AC
  Administered 2012-03-08: 10 mg via INTRAVENOUS
  Filled 2012-03-08: qty 5

## 2012-03-08 MED ORDER — INSULIN ASPART 100 UNIT/ML ~~LOC~~ SOLN
0.0000 [IU] | Freq: Three times a day (TID) | SUBCUTANEOUS | Status: DC
  Administered 2012-03-08: 5 [IU] via SUBCUTANEOUS
  Administered 2012-03-09: 3 [IU] via SUBCUTANEOUS
  Administered 2012-03-10: 8 [IU] via SUBCUTANEOUS
  Administered 2012-03-10: 2 [IU] via SUBCUTANEOUS

## 2012-03-08 MED ORDER — SODIUM CHLORIDE 0.9 % IV SOLN
INTRAVENOUS | Status: DC
  Administered 2012-03-08: 1000 mL via INTRAVENOUS
  Administered 2012-03-08: 20 mL/h via INTRAVENOUS

## 2012-03-08 MED ORDER — INFLUENZA VIRUS VACC SPLIT PF IM SUSP
0.5000 mL | INTRAMUSCULAR | Status: AC
  Filled 2012-03-08: qty 0.5

## 2012-03-08 MED ORDER — DILTIAZEM HCL 100 MG IV SOLR
5.0000 mg/h | Freq: Once | INTRAVENOUS | Status: DC
  Filled 2012-03-08: qty 100

## 2012-03-08 NOTE — Progress Notes (Signed)
CRITICAL VALUE ALERT  Critical value received:  Troponin 1.63  Date of notification:  03/08/2012  Time of notification:  1558  Critical value read back:yes  Nurse who received alert:  Daniel Nones Kailyn Dubie,rn  MD notified (1st page):  Ward Givens  Time of first page:  1609  MD notified (2nd page):  Time of second page:  Responding MD:  Ward Givens  Time MD responded:  [phone]

## 2012-03-08 NOTE — H&P (Signed)
Patient ID: Seth Hardy MRN: [id number], DOB/AGE: Apr 11, 1946   Admit date: 03/08/2012  Primary Physician: Tomi Bamberger, NP Primary Cardiologist: T. Nickoli Bagheri, MD  Pt. Profile:  66 y/o male with h/o CAD s/p CABG who presents with c/p in the setting of rapid afib.  Problem List  Past Medical History  Diagnosis Date  . CAD (coronary artery disease)     a. 01/2010 CABG x 4: LIMA->LAD, VG->D1, VG->OM1, VG->PDA  . HTN (hypertension)   . Hyperlipidemia   . Diabetes mellitus type II, controlled   . Mild aortic stenosis     a. 03/2011 Echo: EF 55-60%, Mild AS  . Carotid disease, bilateral     a. 03/2011 U/S: 40-59% bilat Carotid dzs  . Obesity     Past Surgical History  Procedure Date  . Cyst l kidney   . Angioplasty     Allergies  No Known Allergies  HPI  66 y /o male with the above problem list.  He was last seen in clinic just over 1 month ago.  He was in his USOH until this AM, after he took some pain medicine and later developed nausea and chest pain associated with tachy-palpitations.  He checked his HR and noted it to be in the 140's.  Chest pain persisted and he presented to the [facility] ED.  Here, he was found to be in afib with rvr - 132 bpm, with 3-85mm ST depression in II, III, aVF, V3-V6.  He continued to complain of 4/10 chest discomfort.  While in the ED, he converted to sinus rhythm but cont to have c/p.  He has since had paroxysmal afib.  He was initially called out as a Code STEMI but this was cancelled in the absence of ST elevation.  ST depression appears to be rate related.  All lab work is currently pending.  Home Medications  Prior to Admission medications   Medication Sig Start Date End Date Taking? Authorizing Provider  acetaminophen (TYLENOL) 325 MG tablet Take 650 mg by mouth every 6 (six) hours as needed.      Historical Provider, MD  aspirin 81 MG EC tablet Take 81 mg by mouth daily.      Historical Provider, MD  diphenhydrAMINE (BENADRYL) 25 mg capsule  Take 25 mg by mouth every 6 (six) hours as needed.      Historical Provider, MD  esomeprazole (NEXIUM) 40 MG capsule Take 40 mg by mouth daily before breakfast.      Historical Provider, MD  glimepiride (AMARYL) 4 MG tablet Take 4 mg by mouth 2 (two) times daily.      Historical Provider, MD  insulin glargine (LANTUS) 100 UNIT/ML injection Inject into the skin. UAD     Historical Provider, MD  insulin lispro (HUMALOG) 100 UNIT/ML injection Inject 8 Units into the skin 2 (two) times daily. Plus sliding scale    Historical Provider, MD  metFORMIN (GLUMETZA) 500 MG (MOD) 24 hr tablet Take 500 mg by mouth 2 (two) times daily with a meal.      Historical Provider, MD  metoprolol tartrate (LOPRESSOR) 25 MG tablet Take 1 tablet (25 mg total) by mouth 2 (two) times daily. 03/02/12   Gaylord Shih, MD  nitroGLYCERIN (NITROSTAT) 0.4 MG SL tablet Place 1 tablet (0.4 mg total) under the tongue every 5 (five) minutes as needed. 02/01/12   Gaylord Shih, MD  Omega-3 Fatty Acids (FISH OIL) 1200 MG CAPS Take 1 capsule by mouth daily.  Historical Provider, MD  ramipril (ALTACE) 10 MG capsule Take 1 capsule (10 mg total) by mouth daily. 02/01/12 01/31/13  Gaylord Shih, MD  simvastatin (ZOCOR) 40 MG tablet Take 40 mg by mouth daily.      Historical Provider, MD  vitamin C (ASCORBIC ACID) 500 MG tablet Take 500 mg by mouth daily.      Historical Provider, MD   Family History  Family History  Problem Relation Age of Onset  . COPD Mother     alive 52  . Other Father     alive 46   Social History  History   Social History  . Marital Status: Married    Spouse Name: N/A    Number of Children: N/A  . Years of Education: N/A   Occupational History  . Not on file.   Social History Main Topics  . Smoking status: Never Smoker   . Smokeless tobacco: Not on file   Comment: tobacco use - no  . Alcohol Use: Yes     rare drink  . Drug Use: No  . Sexually Active: Yes   Other Topics Concern  . Not on file    Social History Narrative   Full time. Gets regular exercise. Lives in [place] with his wife.  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    Review of Systems General:  No chills, fever, night sweats or weight changes.  Cardiovascular:  +++ chest pain, dyspnea, nausea, diaphoresis, palpitations.  No edema, orthopnea, palpitations, paroxysmal nocturnal dyspnea. Dermatological: No rash, lesions/masses Respiratory: No cough. Urologic: No hematuria, dysuria Abdominal:   +++ nausea & vomiting while in ED.  No diarrhea, bright red blood per rectum, melena, or hematemesis Neurologic:  No visual changes, wkns, changes in mental status. All other systems reviewed and are otherwise negative except as noted above.  Physical Exam  Blood pressure 150/102, pulse 137, temperature 98.1 F (36.7 C), resp. rate 19, height 5\' 8"  (1.727 m), weight 180 lb (81.647 kg), SpO2 96.00%.  General: Pleasant, NAD Psych: Flat affect. Neuro: Alert and oriented X 3. Moves all extremities spontaneously. HEENT: Normal  Neck: Supple without bruits or JVD. Lungs:  Resp regular and unlabored, CTA. Heart: IR, IR, tachy. No s3, s4, or murmurs. Abdomen: Soft, non-tender, non-distended, BS + x 4.  Extremities: No clubbing, cyanosis or edema. DP/PT/Radials 2+ and equal bilaterally.  Labs  INR 0.96  Trop I 0.00  Lab Results  Component Value Date   WBC 7.2 03/08/2012   HGB 14.6 03/08/2012   HCT 43.0 03/08/2012   MCV 91.6 03/08/2012   PLT 232 03/08/2012     Lab 03/08/12 1059  NA 141  K 4.5  CL 101  CO2 --  BUN 15  CREATININE 1.00  CALCIUM --  PROT --  BILITOT --  ALKPHOS --  ALT --  AST --  GLUCOSE 138*    Radiology/Studies  No results found.  ECG  Afib, rvr, 132, 3-69mm st depression II, III, aVF, V3-V6, 1mm st dep I.  ST depression dramatically improves with restoration of sinus rhythm though it cont to be present - as noted on old ecg's as well.  ASSESSMENT AND PLAN  1.  Afib w/ RVR:  Presents  to ED with chest pain and afib.  Rate related ST depression.  C/O 4-5/10 chest pain in setting of elevated rates.  He has received dilt bolus of 10mg , metoprolol 5mg , and amio 150mg .  He has converted to sinus brady @ 45.  Says he  usually runs in the low 50's @ home on his usual dose of BB (lopressor 25 bid).  Given baseline bradycardia, it is unlikely that we will be able to push bb/add dilt.  Given how symptomatic he becomes, we will likely need to pursue a rhythm control strategy.  Heparin has been started.  CHADS2: 2, CHADS2VASc: 4.  Will plan on coumadin vs novel agent but will hold off on starting until we decide on invasive procedures.  2. USA/CAD:  In setting of rapid afib with deep acute on chronic ST elevation, which appears to be rate related.  Focus on #1 as above, though pt may still require cath today/this admission.  Cont asa, heparin, bb, acei, statin.  Hold metformin.  3.  HTN:  BP elevated in ED.  Follow closely with addition of IV dilt.  Cont home meds.  4.  HL:  Cont statin.  F/U lipids/lft's.  5.  DMII:  Hold metformin.  Cont home dose of insulin.  Add SSI.    6.  N/V: in setting of #1/2.  Prn zofran.  Signed, Nicolasa Ducking, NP 03/08/2012, 11:32 AM I have taken a history, reviewed medications, allergies, PMH, SH, FH, and reviewed ROS and examined the patient.  I agree with the assessment and plan. I do not want to commit him to amiodarone or Tikosyn yet unless bradycardia precludes adding diltiazem. He may have positive troponins. If minimal bump, would perform GXT Myoview before considering Cath. Will need long term anticoagulation.  Malayzia Laforte C. Daleen Squibb, MD, [facility] Pager:  [phone]

## 2012-03-08 NOTE — ED Notes (Signed)
Vomiting and chest pain since this am  Heart rate 151 had a tooth extracted the other day  Took a vicodin on epty stomachsweating bad

## 2012-03-08 NOTE — Progress Notes (Signed)
CRITICAL VALUE ALERT  Critical value received:  Troponin = 2.77  Date of notification:  03/08/2012  Time of notification:  2140  Critical value read back:no; lab did not call; value observed on computer  Nurse who received alert:  Alean Rinne RN  MD notified (1st page):  [facility] PA  Time of first page:  2142  MD notified (2nd page):  Time of second page:  Responding MD: Berton Mount PA  Time MD responded:  2146

## 2012-03-08 NOTE — ED Provider Notes (Signed)
History     CSN: [id number]  Arrival date & time 03/08/12  1015   First MD Initiated Contact with Patient 03/08/12 1031     Chief complaint: chest pain  (Consider location/radiation/quality/duration/timing/severity/associated sxs/prior treatment) HPI Pt with hx cad, cabg 2 yrs ago, states onset this morning of nausea, general malaise, and mid chest pain. Cp constant since. Non radiating. Denies any specific exacerbating or alleviating factors. States unlike cardiac symptoms previously/prior to cabg. Nausea and mild sob persist. States hr also felt fast. States hx afib post cabg, but that he never had any chest pain when in afib previously. Denies cough or uri c/o. No leg pain or swelling. No dvt or pe hx. Denies orthopnea or pnd.      Past Medical History  Diagnosis Date  . Other dyspnea and respiratory abnormality     on exertion  . Undiagnosed cardiac murmurs   . Other symptoms involving cardiovascular system     bruits     Past Surgical History  Procedure Date  . Cyst l kidney   . Angioplasty     No family history on file.  History  Substance Use Topics  . Smoking status: Never Smoker   . Smokeless tobacco: Not on file   Comment: tobacco use - no  . Alcohol Use: Yes      Review of Systems  Constitutional: Negative for fever.  HENT: Negative for neck pain.   Eyes: Negative for redness.  Respiratory: Positive for shortness of breath.   Cardiovascular: Positive for chest pain and palpitations. Negative for leg swelling.  Gastrointestinal: Negative for abdominal pain.  Genitourinary: Negative for flank pain.  Musculoskeletal: Negative for back pain.  Skin: Negative for rash.  Neurological: Negative for headaches.  Hematological: Does not bruise/bleed easily.  Psychiatric/Behavioral: Negative for confusion.    Allergies  Review of patient's allergies indicates no known allergies.  Home Medications   Current Outpatient Rx  Name Route Sig Dispense Refill    . ACETAMINOPHEN 325 MG PO TABS Oral Take 650 mg by mouth every 6 (six) hours as needed.      . ASPIRIN 81 MG PO TBEC Oral Take 81 mg by mouth daily.      Marland Kitchen DIPHENHYDRAMINE HCL 25 MG PO CAPS Oral Take 25 mg by mouth every 6 (six) hours as needed.      Marland Kitchen ESOMEPRAZOLE MAGNESIUM 40 MG PO CPDR Oral Take 40 mg by mouth daily before breakfast.      . GLIMEPIRIDE 4 MG PO TABS Oral Take 4 mg by mouth 2 (two) times daily.      . INSULIN GLARGINE 100 UNIT/ML [place] SOLN Subcutaneous Inject into the skin. UAD     . INSULIN LISPRO (HUMAN) 100 UNIT/ML [place] SOLN Subcutaneous Inject 8 Units into the skin 2 (two) times daily. Plus sliding scale    . METFORMIN HCL ER (MOD) 500 MG PO TB24 Oral Take 500 mg by mouth 2 (two) times daily with a meal.      . METOPROLOL TARTRATE 25 MG PO TABS Oral Take 1 tablet (25 mg total) by mouth 2 (two) times daily. 60 tablet 6  . NITROGLYCERIN 0.4 MG SL SUBL Sublingual Place 1 tablet (0.4 mg total) under the tongue every 5 (five) minutes as needed. 25 tablet 11  . FISH OIL 1200 MG PO CAPS Oral Take 1 capsule by mouth daily.      Marland Kitchen RAMIPRIL 10 MG PO CAPS Oral Take 1 capsule (10 mg total) by mouth  daily. 30 capsule 11  . SIMVASTATIN 40 MG PO TABS Oral Take 40 mg by mouth daily.      Marland Kitchen VITAMIN C 500 MG PO TABS Oral Take 500 mg by mouth daily.        BP 133/100  Pulse 98  Temp 98.1 F (36.7 C)  Resp 20  SpO2 99%  Physical Exam  Nursing note and vitals reviewed. Constitutional: He is oriented to person, place, and time. He appears well-developed and well-nourished. No distress.  HENT:  Head: Atraumatic.  Nose: Nose normal.  Mouth/Throat: Oropharynx is clear and moist.  Eyes: Pupils are equal, round, and reactive to light.  Neck: Neck supple. No JVD present. No tracheal deviation present.  Cardiovascular: Regular rhythm, normal heart sounds and intact distal pulses.  Exam reveals no gallop and no friction rub.   No murmur heard. Pulmonary/Chest: Effort normal and breath sounds  normal. No accessory muscle usage. No respiratory distress.  Abdominal: Soft. Bowel sounds are normal. He exhibits no distension. There is no tenderness.  Musculoskeletal: Normal range of motion. He exhibits no edema and no tenderness.  Neurological: He is alert and oriented to person, place, and time.  Skin: Skin is warm and dry.  Psychiatric: He has a normal mood and affect.    ED Course  Procedures (including critical care time)  Labs Reviewed  GLUCOSE, CAPILLARY - Abnormal; Notable for the following:    Glucose-Capillary 129 (*)     All other components within normal limits  TROPONIN I  CBC  COMPREHENSIVE METABOLIC PANEL  PROTIME-INR    Results for orders placed during the hospital encounter of 03/08/12  TROPONIN I      Component Value Range   Troponin I <0.30  <0.30 ng/mL  CBC      Component Value Range   WBC 7.2  4.0 - 10.5 K/uL   RBC 4.62  4.22 - 5.81 MIL/uL   Hemoglobin 13.9  13.0 - 17.0 g/dL   HCT 96.0  45.4 - 09.8 %   MCV 91.6  78.0 - 100.0 fL   MCH 30.1  26.0 - 34.0 pg   MCHC 32.9  30.0 - 36.0 g/dL   RDW 11.9  14.7 - 82.9 %   Platelets 232  150 - 400 K/uL  COMPREHENSIVE METABOLIC PANEL      Component Value Range   Sodium 138  135 - 145 mEq/L   Potassium 4.6  3.5 - 5.1 mEq/L   Chloride 100  96 - 112 mEq/L   CO2 26  19 - 32 mEq/L   Glucose, Bld 143 (*) 70 - 99 mg/dL   BUN 12  6 - 23 mg/dL   Creatinine, Ser 5.62  0.50 - 1.35 mg/dL   Calcium 9.4  8.4 - 13.0 mg/dL   Total Protein 7.1  6.0 - 8.3 g/dL   Albumin 4.1  3.5 - 5.2 g/dL   AST 40 (*) 0 - 37 U/L   ALT 33  0 - 53 U/L   Alkaline Phosphatase 82  39 - 117 U/L   Total Bilirubin 0.4  0.3 - 1.2 mg/dL   GFR calc non Af Amer >90  >90 mL/min   GFR calc Af Amer >90  >90 mL/min  PROTIME-INR      Component Value Range   Prothrombin Time 12.7  11.6 - 15.2 seconds   INR 0.96  0.00 - 1.49  GLUCOSE, CAPILLARY      Component Value Range   Glucose-Capillary 129 (*)  70 - 99 mg/dL   Comment 1 Notify RN    POCT  I-STAT, CHEM 8      Component Value Range   Sodium 141  135 - 145 mEq/L   Potassium 4.5  3.5 - 5.1 mEq/L   Chloride 101  96 - 112 mEq/L   BUN 15  6 - 23 mg/dL   Creatinine, Ser 1.61  0.50 - 1.35 mg/dL   Glucose, Bld 096 (*) 70 - 99 mg/dL   Calcium, Ion 0.45  4.09 - 1.30 mmol/L   TCO2 27  0 - 100 mmol/L   Hemoglobin 14.6  13.0 - 17.0 g/dL   HCT 81.1  91.4 - 78.2 %  POCT I-STAT TROPONIN I      Component Value Range   Troponin i, poc 0.00  0.00 - 0.08 ng/mL   Comment 3                MDM  Iv ns. Labs. Monitor. Pulse ox.    Date: 03/08/2012  Rate: 132  Rhythm: atrial fibrillation  QRS Axis: normal  Intervals: normal  ST/T Wave abnormalities: ST depressions inferiorly and ST depressions laterally  Conduction Disutrbances:none  Narrative Interpretation:   Old EKG Reviewed: changes noted  Asa.  Pt in rapid afib, cardizem ordered. Prior to cardizem, spontaneous conversion to nsr. ecg repeat. Chest pain persists despite sinus rhythm, rate improvement. Repeat ecg persistent st dep inf and lat, changed from old ecg, w st elev isolated to lead avr. stemi called given ongoing cp, ecg changes, etc although ?whether rate-related changes vs acute ischemia.   leb card in ed with pt.    Date: 03/08/2012  Rate: 52  Rhythm: sinus bradycardia  QRS Axis: normal  Intervals: normal  ST/T Wave abnormalities: ST depressions inferiorly and ST depressions laterally  Conduction Disutrbances:none  Narrative Interpretation:   Old EKG Reviewed: changes noted   CRITICAL CARE Performed by: Suzi Roots   Total critical care time: 45  Critical care time was exclusive of separately billable procedures and treating other patients.  Critical care was necessary to treat or prevent imminent or life-threatening deterioration.  Critical care was time spent personally by me on the following activities: development of treatment plan with patient and/or surrogate as well as nursing, discussions  with consultants, evaluation of patient's response to treatment, examination of patient, obtaining history from patient or surrogate, ordering and performing treatments and interventions, ordering and review of laboratory studies, ordering and review of radiographic studies, pulse oximetry and re-evaluation of patient's condition.                Suzi Roots, MD 03/08/12 [phone]

## 2012-03-08 NOTE — Progress Notes (Signed)
Received call from nursing regarding [name].  1. He had a tooth extracted on 02/29/12 and reports being on Amoxicillin. He is not sure of the dose, frequency or duration. Will request for Pharmacy to clarify and order as previously prescribed.   2. His subsequent troponin was elevated at 1.63. Discussed with Dr. Daleen Squibb. Will order [facility] for tomorrow.  [place], PA-C 03/08/2012 6:16 PM  [phone] pgr

## 2012-03-08 NOTE — ED Notes (Signed)
Pt brought back from triage with HR in the 140. Pt placed on cardiac monitor, IV access established. Pt reports midsternal chest pain 4/10 that radiates into his left arm. Pt reports he started having chest pain this morning while at work and became nauseaed.

## 2012-03-08 NOTE — ED Notes (Signed)
Notified RN of CBG [id number] 

## 2012-03-08 NOTE — ED Notes (Signed)
Gave report to [id number]

## 2012-03-08 NOTE — Progress Notes (Signed)
ANTICOAGULATION CONSULT NOTE - Follow Up Consult  Pharmacy Consult:  Heparin Indication:  ACS  No Known Allergies  Patient Measurements: Height: 5\' 8"  (172.7 cm) Weight: 180 lb (81.647 kg) IBW/kg (Calculated) : 68.4  Heparin Dosing Weight: 82 kg  Vital Signs: Temp: 98.2 F (36.8 C) (10/15 1630) Temp src: Oral (10/15 1630) BP: 164/58 mmHg (10/15 1900) Pulse Rate: 55  (10/15 1900)  Labs:  Basename 03/08/12 1808 03/08/12 1501 03/08/12 1059 03/08/12 1051  HGB -- -- 14.6 13.9  HCT -- -- 43.0 42.3  PLT -- -- -- 232  APTT -- -- -- 30  LABPROT -- -- -- 12.7  INR -- -- -- 0.96  HEPARINUNFRC <0.10* -- -- --  CREATININE -- -- 1.00 0.82  CKTOTAL -- -- -- --  CKMB -- -- -- --  TROPONINI -- 1.63* -- <0.30    Estimated Creatinine Clearance: 71.3 ml/min (by C-G formula based on Cr of 1).   Medical History: Past Medical History  Diagnosis Date  . CAD (coronary artery disease)     a. 01/2010 CABG x 4: LIMA->LAD, VG->D1, VG->OM1, VG->PDA  . HTN (hypertension)   . Hyperlipidemia   . Diabetes mellitus type II, controlled   . Mild aortic stenosis     a. 03/2011 Echo: EF 55-60%, Mild AS  . Carotid disease, bilateral     a. 03/2011 U/S: 40-59% bilat Carotid dzs  . Obesity   . Dysrhythmia   . Heart murmur   . Anginal pain   . GERD (gastroesophageal reflux disease)        Assessment: 33 YOM with known history of CAD s/p CABG in 2011, presented 03/08/2012 with chest pain, and pharmacy consulted to start anticoagulation with IV heparin.    Initial heparin level reported less than goal, no bleeding noted and heparin off for 2 minutes after arrival to 2900 from ED to rearrange infusions.     Goal of Therapy:  Heparin level 0.3-0.7 units/ml Monitor platelets by anticoagulation protocol: Yes    Plan:  - Heparin 2000 units IV bolus x 1 then - Heparin gtt at 1500 units/hr - Check 6 hr HL - Daily HL / CBC   Thank you for allowing pharmacy to be a part of this patients care  team.  Lovenia Kim Pharm.D., BCPS Clinical Pharmacist 03/08/2012 7:31 PM Pager: [phone] Phone: [phone]

## 2012-03-08 NOTE — ED Notes (Signed)
Per MD, Pt hr has converted to 47 after amniodorone.  Hold Cardizem drip

## 2012-03-08 NOTE — Progress Notes (Signed)
ANTICOAGULATION CONSULT NOTE - Initial Consult  Pharmacy Consult:  Heparin Indication:  ACS  No Known Allergies  Patient Measurements: Height: 5\' 8"  (172.7 cm) Weight: 180 lb (81.647 kg) IBW/kg (Calculated) : 68.4  Heparin Dosing Weight: 82 kg  Vital Signs: Temp: 98.1 F (36.7 C) (10/15 1021) BP: 150/102 mmHg (10/15 1025) Pulse Rate: 137  (10/15 1025)  Labs:  Basename 03/08/12 1059  HGB 14.6  HCT 43.0  PLT --  APTT --  LABPROT --  INR --  HEPARINUNFRC --  CREATININE 1.00  CKTOTAL --  CKMB --  TROPONINI --    Estimated Creatinine Clearance: 71.3 ml/min (by C-G formula based on Cr of 1).   Medical History: Past Medical History  Diagnosis Date  . CAD (coronary artery disease)        Assessment: 75 YOM with known history of CAD s/p CABG in 2011, presented with chest pain, and to start anticoagulation with IV heparin.  Baseline labs appropriate to start medication.   Goal of Therapy:  Heparin level 0.3-0.7 units/ml Monitor platelets by anticoagulation protocol: Yes     Plan:  - Heparin 4000 units IV bolus x 1 as ordered, then - Heparin gtt at 1100 units/hr - Check 6 hr HL - Daily HL / CBC    Brevon Dewald D. Laney Potash, PharmD, BCPS Pager:  [phone] 03/08/2012, 11:33 AM       Chelsea Aus D. Laney Potash, PharmD, BCPS Pager:  [phone] 03/08/2012, 11:11 AM

## 2012-03-09 ENCOUNTER — Encounter (HOSPITAL_COMMUNITY)
Admission: EM | Disposition: A | Discharge status: Home / Self Care | Payer: Self-pay | Source: Home / Self Care | Attending: Cardiology

## 2012-03-09 DIAGNOSIS — I214 Non-ST elevation (NSTEMI) myocardial infarction: Secondary | ICD-10-CM

## 2012-03-09 DIAGNOSIS — I2510 Atherosclerotic heart disease of native coronary artery without angina pectoris: Secondary | ICD-10-CM

## 2012-03-09 HISTORY — PX: PERCUTANEOUS CORONARY STENT INTERVENTION (PCI-S): SHX5485

## 2012-03-09 HISTORY — PX: LEFT HEART CATHETERIZATION WITH CORONARY ANGIOGRAM: SHX5451

## 2012-03-09 LAB — LIPID PANEL
Cholesterol: 99 mg/dL (ref 0–200)
HDL: 37 mg/dL — ABNORMAL LOW (ref >39)
LDL Cholesterol: 45 mg/dL (ref 0–99)
Total CHOL/HDL Ratio: 2.7 RATIO
Triglycerides: 84 mg/dL (ref <150)
VLDL: 17 mg/dL (ref 0–40)

## 2012-03-09 LAB — GLUCOSE, CAPILLARY
Glucose-Capillary: 115 mg/dL — ABNORMAL HIGH (ref 70–99)
Glucose-Capillary: 95 mg/dL (ref 70–99)
Glucose-Capillary: 99 mg/dL (ref 70–99)
Glucose-Capillary: 177 mg/dL — ABNORMAL HIGH (ref 70–99)

## 2012-03-09 LAB — HEPARIN LEVEL (UNFRACTIONATED)
Heparin Unfractionated: 0.65 IU/mL (ref 0.30–0.70)
Heparin Unfractionated: 0.69 IU/mL (ref 0.30–0.70)

## 2012-03-09 LAB — CBC
HCT: 37.8 % — ABNORMAL LOW (ref 39.0–52.0)
Hemoglobin: 12.3 g/dL — ABNORMAL LOW (ref 13.0–17.0)
MCH: 29.7 pg (ref 26.0–34.0)
MCHC: 32.5 g/dL (ref 30.0–36.0)
MCV: 91.3 fL (ref 78.0–100.0)
Platelets: 196 10*3/uL (ref 150–400)
RBC: 4.14 MIL/uL — ABNORMAL LOW (ref 4.22–5.81)
RDW: 12.8 % (ref 11.5–15.5)
WBC: 7.5 10*3/uL (ref 4.0–10.5)

## 2012-03-09 LAB — TROPONIN I
Troponin I: 1.72 ng/mL (ref <0.30)
Troponin I: 1.33 ng/mL (ref <0.30)

## 2012-03-09 LAB — COMPREHENSIVE METABOLIC PANEL
ALT: 25 U/L (ref 0–53)
AST: 29 U/L (ref 0–37)
Albumin: 3.4 g/dL — ABNORMAL LOW (ref 3.5–5.2)
Alkaline Phosphatase: 68 U/L (ref 39–117)
BUN: 12 mg/dL (ref 6–23)
CO2: 28 mEq/L (ref 19–32)
Calcium: 9 mg/dL (ref 8.4–10.5)
Chloride: 103 mEq/L (ref 96–112)
Creatinine, Ser: 0.9 mg/dL (ref 0.50–1.35)
GFR calc Af Amer: >90 mL/min (ref >90)
GFR calc non Af Amer: 87 mL/min — ABNORMAL LOW (ref >90)
Glucose, Bld: 56 mg/dL — ABNORMAL LOW (ref 70–99)
Potassium: 4.5 mEq/L (ref 3.5–5.1)
Sodium: 137 mEq/L (ref 135–145)
Total Bilirubin: 0.3 mg/dL (ref 0.3–1.2)
Total Protein: 6.2 g/dL (ref 6.0–8.3)

## 2012-03-09 LAB — TSH: TSH: 0.985 u[IU]/mL (ref 0.350–4.500)

## 2012-03-09 LAB — POCT ACTIVATED CLOTTING TIME: Activated Clotting Time: 334 seconds

## 2012-03-09 SURGERY — LEFT HEART CATHETERIZATION WITH CORONARY ANGIOGRAM
Anesthesia: LOCAL

## 2012-03-09 MED ORDER — NITROGLYCERIN 0.2 MG/ML ON CALL CATH LAB
INTRAVENOUS | Status: AC
  Filled 2012-03-09: qty 1

## 2012-03-09 MED ORDER — FENTANYL CITRATE 0.05 MG/ML IJ SOLN
INTRAMUSCULAR | Status: AC
  Filled 2012-03-09: qty 2

## 2012-03-09 MED ORDER — ASPIRIN 81 MG PO CHEW
324.0000 mg | CHEWABLE_TABLET | ORAL | Status: AC
  Administered 2012-03-09: 324 mg via ORAL
  Filled 2012-03-09: qty 4

## 2012-03-09 MED ORDER — CLOPIDOGREL BISULFATE 300 MG PO TABS
ORAL_TABLET | ORAL | Status: AC
  Filled 2012-03-09: qty 2

## 2012-03-09 MED ORDER — AMLODIPINE BESYLATE 5 MG PO TABS
5.0000 mg | ORAL_TABLET | Freq: Every day | ORAL | Status: DC
  Administered 2012-03-09: 5 mg via ORAL
  Filled 2012-03-09 (×2): qty 1

## 2012-03-09 MED ORDER — MIDAZOLAM HCL 2 MG/2ML IJ SOLN
INTRAMUSCULAR | Status: AC
  Filled 2012-03-09: qty 2

## 2012-03-09 MED ORDER — ASPIRIN EC 81 MG PO TBEC
81.0000 mg | DELAYED_RELEASE_TABLET | Freq: Every day | ORAL | Status: DC
  Administered 2012-03-10: 81 mg via ORAL
  Filled 2012-03-09: qty 1

## 2012-03-09 MED ORDER — SODIUM CHLORIDE 0.9 % IV SOLN: INTRAVENOUS | Status: DC

## 2012-03-09 MED ORDER — SODIUM CHLORIDE 0.9 % IV SOLN: INTRAVENOUS | Status: AC

## 2012-03-09 MED ORDER — BIVALIRUDIN 250 MG IV SOLR
INTRAVENOUS | Status: AC
  Filled 2012-03-09: qty 250

## 2012-03-09 MED ORDER — MORPHINE SULFATE 2 MG/ML IJ SOLN
2.0000 mg | INTRAMUSCULAR | Status: DC | PRN
  Administered 2012-03-09: 2 mg via INTRAVENOUS
  Filled 2012-03-09: qty 1

## 2012-03-09 MED ORDER — CLOPIDOGREL BISULFATE 75 MG PO TABS
75.0000 mg | ORAL_TABLET | Freq: Every day | ORAL | Status: DC
  Administered 2012-03-10: 75 mg via ORAL
  Filled 2012-03-09: qty 1

## 2012-03-09 MED ORDER — DIAZEPAM 5 MG PO TABS
5.0000 mg | ORAL_TABLET | ORAL | Status: AC
  Administered 2012-03-09: 5 mg via ORAL
  Filled 2012-03-09: qty 1

## 2012-03-09 MED ORDER — HEPARIN (PORCINE) IN NACL 2-0.9 UNIT/ML-% IJ SOLN
INTRAMUSCULAR | Status: AC
  Filled 2012-03-09: qty 500

## 2012-03-09 NOTE — Progress Notes (Signed)
Inpatient Diabetes Program Recommendations  AACE/ADA: New Consensus Statement on Inpatient Glycemic Control (2013)  Target Ranges:  Prepandial:   less than 140 mg/dL      Peak postprandial:   less than 180 mg/dL (1-2 hours)      Critically ill patients:  140 - 180 mg/dL   Results for Seth Hardy, Seth Hardy (MRN [id number]) as of 03/09/2012 12:21  Ref. Range 03/09/2012 02:43  Glucose Latest Range: 70-99 mg/dL 56 (L)   Results for Seth Hardy, Seth Hardy (MRN [id number]) as of 03/09/2012 12:21  Ref. Range 03/09/2012 04:09 03/09/2012 07:59 03/09/2012 11:37  Glucose-Capillary Latest Range: 70-99 mg/dL 782 (H) 95 99    Inpatient Diabetes Program Recommendations Insulin - Meal Coverage: Please designate Novolog 8 units BID as meal coverage so that it will only be given it patient eats.  Note:  Will continue to follow. Thanks,  Orlando Penner, RN, BSN, CCRN Diabetes Coordinator Inpatient Diabetes Program [phone]

## 2012-03-09 NOTE — CV Procedure (Signed)
Cardiac Catheterization Procedure Note  Name: Seth Hardy MRN: [id number] DOB: [birth year]  Procedure: Left Heart Cath, Selective Coronary Angiography, SVG angiography, LIMA angiography, LV angiography,  PTCA/Stent of proximal RCA with a bare-metal stent, mynx closure device.  Indication: Non-ST elevation myocardial infarction in the setting of atrial fibrillation with rapid ventricular response.   Medications:  Sedation:  1 mg IV Versed, 25 mcg IV Fentanyl  Contrast:  245 ml Omnipaque  Diagnostic Procedure Details: The right groin was prepped, draped, and anesthetized with 1% lidocaine. Using the modified Seldinger technique, a 5 French sheath was introduced into the right femoral artery. Standard Judkins catheters were used for selective coronary angiography and left ventriculography. The RCA catheter was used for SVG angiography and LIMA angiography. Catheter exchanges were performed over a wire.  The diagnostic procedure was well-tolerated without immediate complications.  Procedural Findings:  Hemodynamics: AO:  186/69   mmHg LV:  189/14    mmHg LVEDP: 40  mmHg  Coronary angiography: Coronary dominance: Right   Left Main:  Normal in size and mildly calcified with 20% distal stenosis.  Left Anterior Descending (LAD):  Significantly calcified throughout its course mostly in the proximal and midsegment. The vessel is occluded in the midsegment.  1st diagonal (D1):  Appears to be occluded with faint collaterals.  2nd diagonal (D2):  Occluded with collaterals coming from distal LAD.  Circumflex (LCx):  Normal in size and nondominant. There is a 60-70% tubular stenosis proximally starting at the ostium. There is competitive flow noted into OM 2.  1st obtuse marginal:  Normal in size with 50% ostial stenosis.  2nd obtuse marginal:  Normal in size with 40% proximal disease. This is supplied by a patent SVG.  3rd obtuse marginal:  Normal in size with minor  irregularities.   Right Coronary Artery: Large in size and dominant. There is a 30% ostial stenosis followed by an 80-90% proximal Street stenosis. There is 30% diffuse disease in the midsegment. Calcifications are noted in the mid to distal segment.  posterior descending artery: Normal in size with diffuse 50-60% disease which is unchanged from before.  The posterior AV groove has a 50% ostial stenosis.  posterior lateral branch:  Medium in size with mild diffuse atherosclerosis.   SVG to OM 2: Patent but appears to be small in size likely due to competitive antegrade flow. This is a jump graft that also includes and SVG to diagonal which has very sluggish flow. SVG to RCA is occluded at the ostium. LIMA to LAD is patent with no significant disease. Collaterals are noted from the distal LAD to the diagonal. There is mild diffuse disease in the distal LAD after the anastomosis.  Left ventriculography: Left ventricular systolic function is normal , LVEF is estimated at 60 %, there is no significant mitral regurgitation   PCI Procedure Note:  Following the diagnostic procedure, the decision was made to proceed with PCI. The sheath was upsized to a 6 [place]. Weight-based bivalirudin was given for anticoagulation. Once a therapeutic ACT was achieved, a 6 [place] AL-1 with side holes guide catheter was inserted.  A intuition coronary guidewire was used to cross the lesion.  The lesion was predilated with a 2.5 x 12 balloon.  The lesion was then stented with a 3.5 x 18 mm vision stent.  The stent was postdilated with a 4.0 noncompliant balloon.  Following PCI, there was 0% residual stenosis and TIMI-3 flow. Final angiography confirmed an excellent result. I did not  place the stent all the way back to the ostium. There was residual 30% disease at the ostium. I elected not to treat this as this would be high risk for restenosis with a bare-metal stent. If stenosis happens in that location in the future, a  drug-eluting stent is preferred. Femoral hemostasis was achieved with a Mynx closure device..  The patient tolerated the PCI procedure well. There were no immediate procedural complications.  The patient was transferred to the post catheterization recovery area for further monitoring.  PCI Data: Vessel - RCA/Segment - proximal Percent Stenosis (pre)  80-90% TIMI-flow 3 Stent 3.5 x 18 mm vision bare-metal stent postdilated with a 4.0 noncompliant balloon Percent Stenosis (post) 0% TIMI-flow (post) 3  Final Conclusions:  1. Significant three-vessel coronary artery disease with patent LIMA to LAD and SVG to OM 2. SVG to diagonal is occluded but the area is supplied via collaterals from the distal LAD. SVG to RCA is occluded with significant proximal disease in the native RCA. 2. Significantly elevated systemic hypertension with significant elevation in left ventricular end-diastolic pressure. 3. Normal LV systolic function with minimal gradient across the aortic valve. 4. Successful angioplasty and bare-metal stent placement to the proximal right coronary artery.  Recommendations:  I recommend Plavix for at least one month and preferably for 3 months. I elected a bare-metal stent due to the need for anticoagulation with warfarin. I will add amlodipine for blood pressure control. Warfarin can likely be started tomorrow if no bleeding complications.  Lorine Bears MD, [facility] 03/09/2012, 11:32 AM

## 2012-03-09 NOTE — Interval H&P Note (Signed)
History and Physical Interval Note:  03/09/2012 9:58 AM  Seth Hardy  has presented today for surgery, with the diagnosis of cp  The various methods of treatment have been discussed with the patient and family. After consideration of risks, benefits and other options for treatment, the patient has consented to  Procedure(s) (LRB) with comments: LEFT HEART CATHETERIZATION WITH CORONARY ANGIOGRAM (N/A) as a surgical intervention .  [name] history has been reviewed, patient examined, no change in status, stable for surgery.  I have reviewed the patient's chart and labs.  Questions were answered to the patient's satisfaction.     Lorine Bears

## 2012-03-09 NOTE — H&P (View-Only) (Signed)
Subjective:  The patient is not having any chest discomfort this morning.  His rhythm is sinus bradycardia.  Cardiac enzymes are elevated consistent with a non-STEMI. EKG shows anterolateral T wave inversion similar to prior EKG of 02/01/12.  Objective:  Vital Signs in the last 24 hours: Temp:  [97.5 F (36.4 C)-98.9 F (37.2 C)] 97.5 F (36.4 C) (10/16 0800) Pulse Rate:  [41-137] 52  (10/16 0800) Resp:  [14-22] 16  (10/16 0800) BP: (126-183)/(49-102) 174/64 mmHg (10/16 0800) SpO2:  [90 %-100 %] 100 % (10/16 0800) Weight:  [180 lb (81.647 kg)-180 lb 8.9 oz (81.9 kg)] 180 lb 8.9 oz (81.9 kg) (10/16 0500)  Intake/Output from previous day: 10/15 0701 - 10/16 0700 In: 710.7 [P.O.:240; I.V.:470.7] Out: 1700 [Urine:1700] Intake/Output from this shift: Total I/O In: -  Out: 525 [Urine:525]     . amiodarone  150 mg Intravenous Once  . amoxicillin  500 mg Oral QID  . aspirin  324 mg Oral Once  . aspirin  324 mg Oral Pre-Cath  . aspirin EC  81 mg Oral Daily  . atorvastatin  20 mg Oral q1800  . diazepam  5 mg Oral On Call  . diltiazem  10 mg Intravenous Once  . glimepiride  4 mg Oral BID WC  . heparin  2,000 Units Intravenous STAT  . heparin  4,000 Units Intravenous STAT  . influenza  inactive virus vaccine  0.5 mL Intramuscular Tomorrow-1000  . insulin aspart  0-15 Units Subcutaneous TID WC  . insulin aspart  8 Units Subcutaneous BID  . metoprolol  5 mg Intravenous STAT  . metoprolol tartrate  25 mg Oral BID  . omega-3 acid ethyl esters  1 g Oral Daily  . ondansetron      . pantoprazole  40 mg Oral Daily  . pneumococcal 23 valent vaccine  0.5 mL Intramuscular Tomorrow-1000  . ramipril  10 mg Oral Daily  . vitamin C  500 mg Oral Daily  . DISCONTD: aspirin  324 mg Oral Once  . DISCONTD: aspirin EC  81 mg Oral Daily  . DISCONTD: diltiazem (CARDIZEM) infusion  5 mg/hr Intravenous Once  . DISCONTD: regadenoson  0.4 mg Intravenous Once  . DISCONTD: simvastatin  40 mg Oral Daily   . DISCONTD: traMADol  50 mg Oral Q12H      . sodium chloride 10 mL/hr (03/08/12 1500)  . sodium chloride    . diltiazem (CARDIZEM) infusion    . heparin 1,500 Units/hr (03/09/12 0421)  . DISCONTD: sodium chloride    . DISCONTD: amiodarone (NEXTERONE PREMIX) 360 mg/200 mL dextrose    . DISCONTD: amiodarone (NEXTERONE PREMIX) 360 mg/200 mL dextrose      Physical Exam: The patient appears to be in no distress.  Head and neck exam reveals that the pupils are equal and reactive.  The extraocular movements are full.  There is no scleral icterus.  Mouth and pharynx are benign.  No lymphadenopathy.  No carotid bruits.  The jugular venous pressure is normal.  Thyroid is not enlarged or tender.  Chest is clear to percussion and auscultation.  No rales or rhonchi.  Expansion of the chest is symmetrical.  Heart reveals no abnormal lift or heave.  First and second heart sounds are normal.  There is no  gallop rub or click.  There is a grade 2/6 systolic ejection murmur consistent with aortic stenosis  The abdomen is soft and nontender.  Bowel sounds are normoactive.  There is no hepatosplenomegaly  or mass.  There are no abdominal bruits.  Extremities reveal no phlebitis or edema.  Pedal pulses are good.  There is no cyanosis or clubbing.  Good radial and pedal pulses.  Neurologic exam is normal strength and no lateralizing weakness.  No sensory deficits.  Integument reveals no rash  Lab Results:  Basename 03/09/12 0243 03/08/12 1059 03/08/12 1051  WBC 7.5 -- 7.2  HGB 12.3* 14.6 --  PLT 196 -- 232    Basename 03/09/12 0243 03/08/12 1059 03/08/12 1051  NA 137 141 --  K 4.5 4.5 --  CL 103 101 --  CO2 28 -- 26  GLUCOSE 56* 138* --  BUN 12 15 --  CREATININE 0.90 1.00 --    Basename 03/09/12 0244 03/08/12 2039  TROPONINI 1.72* 2.77*   Hepatic Function Panel  Basename 03/09/12 0243  PROT 6.2  ALBUMIN 3.4*  AST 29  ALT 25  ALKPHOS 68  BILITOT 0.3  BILIDIR --  IBILI --     Basename 03/09/12 0243  CHOL 99   No results found for this basename: PROTIME in the last 72 hours  Imaging: Imaging results have been    Cardiac Studies: Telemetry shows sinus bradycardia and anterolateral T wave inversion Assessment/Plan:  Patient Active Hospital Problem List: 1.  Paroxysmal atrial fibrillation with rapid ventricular response, resolved 2.  ischemic heart disease status post CABG x4 in September 2011 3. non-STEMI with elevated troponin 2.77 4. aortic stenosis, mild by echocardiogram 03/2011 5. carotid disease, bilateral with bilateral 40-59% stenosis by ultrasound November 2012, asymptomatic 6. Hypertension 7. diabetes mellitus type 2, controlled  Plan: With significant bump in troponins we will forego the scan Myoview and proceed to cardiac catheterization.  Discussed with patient and wife who are in agreement.   LOS: 1 day    [name] 03/09/2012, 8:13 AM

## 2012-03-09 NOTE — Progress Notes (Signed)
 Subjective:  The patient is not having any chest discomfort this morning.  His rhythm is sinus bradycardia.  Cardiac enzymes are elevated consistent with a non-STEMI. EKG shows anterolateral T wave inversion similar to prior EKG of 02/01/12.  Objective:  Vital Signs in the last 24 hours: Temp:  [97.5 F (36.4 C)-98.9 F (37.2 C)] 97.5 F (36.4 C) (10/16 0800) Pulse Rate:  [41-137] 52  (10/16 0800) Resp:  [14-22] 16  (10/16 0800) BP: (126-183)/(49-102) 174/64 mmHg (10/16 0800) SpO2:  [90 %-100 %] 100 % (10/16 0800) Weight:  [180 lb (81.647 kg)-180 lb 8.9 oz (81.9 kg)] 180 lb 8.9 oz (81.9 kg) (10/16 0500)  Intake/Output from previous day: 10/15 0701 - 10/16 0700 In: 710.7 [P.O.:240; I.V.:470.7] Out: 1700 [Urine:1700] Intake/Output from this shift: Total I/O In: -  Out: 525 [Urine:525]     . amiodarone  150 mg Intravenous Once  . amoxicillin  500 mg Oral QID  . aspirin  324 mg Oral Once  . aspirin  324 mg Oral Pre-Cath  . aspirin EC  81 mg Oral Daily  . atorvastatin  20 mg Oral q1800  . diazepam  5 mg Oral On Call  . diltiazem  10 mg Intravenous Once  . glimepiride  4 mg Oral BID WC  . heparin  2,000 Units Intravenous STAT  . heparin  4,000 Units Intravenous STAT  . influenza  inactive virus vaccine  0.5 mL Intramuscular Tomorrow-1000  . insulin aspart  0-15 Units Subcutaneous TID WC  . insulin aspart  8 Units Subcutaneous BID  . metoprolol  5 mg Intravenous STAT  . metoprolol tartrate  25 mg Oral BID  . omega-3 acid ethyl esters  1 g Oral Daily  . ondansetron      . pantoprazole  40 mg Oral Daily  . pneumococcal 23 valent vaccine  0.5 mL Intramuscular Tomorrow-1000  . ramipril  10 mg Oral Daily  . vitamin C  500 mg Oral Daily  . DISCONTD: aspirin  324 mg Oral Once  . DISCONTD: aspirin EC  81 mg Oral Daily  . DISCONTD: diltiazem (CARDIZEM) infusion  5 mg/hr Intravenous Once  . DISCONTD: regadenoson  0.4 mg Intravenous Once  . DISCONTD: simvastatin  40 mg Oral Daily   . DISCONTD: traMADol  50 mg Oral Q12H      . sodium chloride 10 mL/hr (03/08/12 1500)  . sodium chloride    . diltiazem (CARDIZEM) infusion    . heparin 1,500 Units/hr (03/09/12 0421)  . DISCONTD: sodium chloride    . DISCONTD: amiodarone (NEXTERONE PREMIX) 360 mg/200 mL dextrose    . DISCONTD: amiodarone (NEXTERONE PREMIX) 360 mg/200 mL dextrose      Physical Exam: The patient appears to be in no distress.  Head and neck exam reveals that the pupils are equal and reactive.  The extraocular movements are full.  There is no scleral icterus.  Mouth and pharynx are benign.  No lymphadenopathy.  No carotid bruits.  The jugular venous pressure is normal.  Thyroid is not enlarged or tender.  Chest is clear to percussion and auscultation.  No rales or rhonchi.  Expansion of the chest is symmetrical.  Heart reveals no abnormal lift or heave.  First and second heart sounds are normal.  There is no  gallop rub or click.  There is a grade 2/6 systolic ejection murmur consistent with aortic stenosis  The abdomen is soft and nontender.  Bowel sounds are normoactive.  There is no hepatosplenomegaly   or mass.  There are no abdominal bruits.  Extremities reveal no phlebitis or edema.  Pedal pulses are good.  There is no cyanosis or clubbing.  Good radial and pedal pulses.  Neurologic exam is normal strength and no lateralizing weakness.  No sensory deficits.  Integument reveals no rash  Lab Results:  Basename 03/09/12 0243 03/08/12 1059 03/08/12 1051  WBC 7.5 -- 7.2  HGB 12.3* 14.6 --  PLT 196 -- 232    Basename 03/09/12 0243 03/08/12 1059 03/08/12 1051  NA 137 141 --  K 4.5 4.5 --  CL 103 101 --  CO2 28 -- 26  GLUCOSE 56* 138* --  BUN 12 15 --  CREATININE 0.90 1.00 --    Basename 03/09/12 0244 03/08/12 2039  TROPONINI 1.72* 2.77*   Hepatic Function Panel  Basename 03/09/12 0243  PROT 6.2  ALBUMIN 3.4*  AST 29  ALT 25  ALKPHOS 68  BILITOT 0.3  BILIDIR --  IBILI --     Basename 03/09/12 0243  CHOL 99   No results found for this basename: PROTIME in the last 72 hours  Imaging: Imaging results have been    Cardiac Studies: Telemetry shows sinus bradycardia and anterolateral T wave inversion Assessment/Plan:  Patient Active Hospital Problem List: 1.  Paroxysmal atrial fibrillation with rapid ventricular response, resolved 2.  ischemic heart disease status post CABG x4 in September 2011 3. non-STEMI with elevated troponin 2.77 4. aortic stenosis, mild by echocardiogram 03/2011 5. carotid disease, bilateral with bilateral 40-59% stenosis by ultrasound November 2012, asymptomatic 6. Hypertension 7. diabetes mellitus type 2, controlled  Plan: With significant bump in troponins we will forego the scan Myoview and proceed to cardiac catheterization.  Discussed with patient and wife who are in agreement.   LOS: 1 day    Seth Hardy 03/09/2012, 8:13 AM    

## 2012-03-09 NOTE — Progress Notes (Signed)
ANTICOAGULATION CONSULT NOTE - Follow Up Consult  Pharmacy Consult for heparin Indication: chest pain/ACS  Labs:  Basename 03/09/12 0109 03/08/12 2039 03/08/12 1808 03/08/12 1501 03/08/12 1059 03/08/12 1051  HGB -- -- -- -- 14.6 13.9  HCT -- -- -- -- 43.0 42.3  PLT -- -- -- -- -- 232  APTT -- -- -- -- -- 30  LABPROT -- -- -- -- -- 12.7  INR -- -- -- -- -- 0.96  HEPARINUNFRC 0.65 -- <0.10* -- -- --  CREATININE -- -- -- -- 1.00 0.82  CKTOTAL -- -- -- -- -- --  CKMB -- -- -- -- -- --  TROPONINI -- 2.77* -- 1.63* -- <0.30    Assessment/Plan:  66yo male now therapeutic on heparin after rate increase.  Will continue gtt at current rate and confirm stable with additional level.  Colleen Can PharmD BCPS 03/09/2012,2:33 AM

## 2012-03-10 ENCOUNTER — Encounter (HOSPITAL_COMMUNITY): Payer: Self-pay | Admitting: Physician Assistant

## 2012-03-10 ENCOUNTER — Telehealth: Payer: Self-pay | Admitting: Cardiology

## 2012-03-10 LAB — CBC
HCT: 41.7 % (ref 39.0–52.0)
Hemoglobin: 13.6 g/dL (ref 13.0–17.0)
MCH: 29.9 pg (ref 26.0–34.0)
MCHC: 32.6 g/dL (ref 30.0–36.0)
MCV: 91.6 fL (ref 78.0–100.0)
Platelets: 172 10*3/uL (ref 150–400)
RBC: 4.55 MIL/uL (ref 4.22–5.81)
RDW: 12.8 % (ref 11.5–15.5)
WBC: 5.6 10*3/uL (ref 4.0–10.5)

## 2012-03-10 LAB — GLUCOSE, CAPILLARY
Glucose-Capillary: 136 mg/dL — ABNORMAL HIGH (ref 70–99)
Glucose-Capillary: 272 mg/dL — ABNORMAL HIGH (ref 70–99)

## 2012-03-10 LAB — BASIC METABOLIC PANEL
BUN: 9 mg/dL (ref 6–23)
CO2: 28 mEq/L (ref 19–32)
Calcium: 9.4 mg/dL (ref 8.4–10.5)
Chloride: 102 mEq/L (ref 96–112)
Creatinine, Ser: 0.87 mg/dL (ref 0.50–1.35)
GFR calc Af Amer: >90 mL/min (ref >90)
GFR calc non Af Amer: 89 mL/min — ABNORMAL LOW (ref >90)
Glucose, Bld: 83 mg/dL (ref 70–99)
Potassium: 4.2 mEq/L (ref 3.5–5.1)
Sodium: 141 mEq/L (ref 135–145)

## 2012-03-10 MED ORDER — NITROGLYCERIN 0.4 MG SL SUBL: 0.4000 mg | SUBLINGUAL_TABLET | SUBLINGUAL | Status: DC | PRN

## 2012-03-10 MED ORDER — PANTOPRAZOLE SODIUM 40 MG PO TBEC: 40.0000 mg | DELAYED_RELEASE_TABLET | Freq: Every day | ORAL | Status: DC

## 2012-03-10 MED ORDER — HYDRALAZINE HCL 20 MG/ML IJ SOLN
INTRAMUSCULAR | Status: AC
  Filled 2012-03-10: qty 1

## 2012-03-10 MED ORDER — ATORVASTATIN CALCIUM 20 MG PO TABS: 20.0000 mg | ORAL_TABLET | Freq: Every day | ORAL | Status: DC

## 2012-03-10 MED ORDER — WARFARIN SODIUM 5 MG PO TABS: 5.0000 mg | ORAL_TABLET | ORAL | Status: DC

## 2012-03-10 MED ORDER — COUMADIN BOOK
Freq: Once | Status: AC
  Administered 2012-03-10: 10:00:00
  Filled 2012-03-10: qty 1

## 2012-03-10 MED ORDER — AMLODIPINE BESYLATE 10 MG PO TABS: 10.0000 mg | ORAL_TABLET | Freq: Every day | ORAL | Status: DC

## 2012-03-10 MED ORDER — PNEUMOCOCCAL VAC POLYVALENT 25 MCG/0.5ML IJ INJ
0.5000 mL | INJECTION | INTRAMUSCULAR | Status: AC
  Administered 2012-03-10: 0.5 mL via INTRAMUSCULAR
  Filled 2012-03-10 (×2): qty 0.5

## 2012-03-10 MED ORDER — CLOPIDOGREL BISULFATE 75 MG PO TABS: 75.0000 mg | ORAL_TABLET | Freq: Every day | ORAL | Status: DC

## 2012-03-10 MED ORDER — WARFARIN - PHARMACIST DOSING INPATIENT: Freq: Every day | Status: DC

## 2012-03-10 MED ORDER — AMOXICILLIN 250 MG PO CAPS: 500.0000 mg | ORAL_CAPSULE | Freq: Four times a day (QID) | ORAL | Status: DC

## 2012-03-10 MED ORDER — WARFARIN VIDEO
Freq: Once | Status: AC
  Administered 2012-03-10: 10:00:00

## 2012-03-10 MED ORDER — INFLUENZA VIRUS VACC SPLIT PF IM SUSP
0.5000 mL | Freq: Once | INTRAMUSCULAR | Status: AC
  Administered 2012-03-10: 0.5 mL via INTRAMUSCULAR
  Filled 2012-03-10 (×2): qty 0.5

## 2012-03-10 MED ORDER — WARFARIN SODIUM 5 MG PO TABS
5.0000 mg | ORAL_TABLET | Freq: Every day | ORAL | Status: DC
  Filled 2012-03-10: qty 1

## 2012-03-10 MED ORDER — HYDRALAZINE HCL 20 MG/ML IJ SOLN
10.0000 mg | Freq: Once | INTRAMUSCULAR | Status: AC
  Administered 2012-03-10: 10 mg via INTRAVENOUS

## 2012-03-10 MED ORDER — INSULIN LISPRO 100 UNIT/ML ~~LOC~~ SOLN: 8.0000 [IU] | Freq: Two times a day (BID) | SUBCUTANEOUS | Status: DC

## 2012-03-10 MED ORDER — AMLODIPINE BESYLATE 10 MG PO TABS
10.0000 mg | ORAL_TABLET | Freq: Every day | ORAL | Status: DC
  Administered 2012-03-10: 10 mg via ORAL
  Filled 2012-03-10: qty 1

## 2012-03-10 MED ORDER — NITROGLYCERIN 2 % TD OINT
1.0000 [in_us] | TOPICAL_OINTMENT | Freq: Once | TRANSDERMAL | Status: AC
  Administered 2012-03-10: 1 [in_us] via TOPICAL
  Filled 2012-03-10: qty 30

## 2012-03-10 MED FILL — Dextrose Inj 5%: INTRAVENOUS | Qty: 50 | Status: AC

## 2012-03-10 NOTE — Progress Notes (Signed)
Pt discharged to home accompanied by wife. Discharge instructions given. Verbalized understanding. No distress noted upon discharge.

## 2012-03-10 NOTE — Progress Notes (Signed)
ANTICOAGULATION CONSULT NOTE - Initial Consult  Pharmacy Consult for coumadin Indication: atrial fibrillation  No Known Allergies  Patient Measurements: Height: 5\' 8"  (172.7 cm) Weight: 180 lb 8.9 oz (81.9 kg) IBW/kg (Calculated) : 68.4  Heparin Dosing Weight:   Vital Signs: Temp: 98.2 F (36.8 C) (10/17 0717) Temp src: Oral (10/17 0717) BP: 187/69 mmHg (10/17 0800) Pulse Rate: 73  (10/17 0800)  Labs:  Alvira Philips 03/10/12 0438 03/09/12 0856 03/09/12 0244 03/09/12 0243 03/09/12 0109 03/08/12 2039 03/08/12 1808 03/08/12 1059 03/08/12 1051  HGB 13.6 -- -- 12.3* -- -- -- -- --  HCT 41.7 -- -- 37.8* -- -- -- 43.0 --  PLT 172 -- -- 196 -- -- -- -- 232  APTT -- -- -- -- -- -- -- -- 30  LABPROT -- -- -- -- -- -- -- -- 12.7  INR -- -- -- -- -- -- -- -- 0.96  HEPARINUNFRC -- 0.69 -- -- 0.65 -- <0.10* -- --  CREATININE 0.87 -- -- 0.90 -- -- -- 1.00 --  CKTOTAL -- -- -- -- -- -- -- -- --  CKMB -- -- -- -- -- -- -- -- --  TROPONINI -- 1.33* 1.72* -- -- 2.77* -- -- --    Estimated Creatinine Clearance: 81.9 ml/min (by C-G formula based on Cr of 0.87).   Medical History: Past Medical History  Diagnosis Date  . CAD (coronary artery disease)     a. 01/2010 CABG x 4: LIMA->LAD, VG->D1, VG->OM1, VG->PDA  . HTN (hypertension)   . Hyperlipidemia   . Diabetes mellitus type II, controlled   . Mild aortic stenosis     a. 03/2011 Echo: EF 55-60%, Mild AS  . Carotid disease, bilateral     a. 03/2011 U/S: 40-59% bilat Carotid dzs  . Obesity   . Dysrhythmia   . Heart murmur   . Anginal pain   . GERD (gastroesophageal reflux disease)     Medications:  Scheduled:    . amLODipine  10 mg Oral Daily  . amoxicillin  500 mg Oral QID  . aspirin EC  81 mg Oral Daily  . atorvastatin  20 mg Oral q1800  . bivalirudin      . clopidogrel      . clopidogrel  75 mg Oral Q breakfast  . diazepam  5 mg Oral On Call  . fentaNYL      . glimepiride  4 mg Oral BID WC  . heparin      . hydrALAZINE       . hydrALAZINE  10 mg Intravenous Once  . influenza  inactive virus vaccine  0.5 mL Intramuscular Tomorrow-1000  . insulin aspart  0-15 Units Subcutaneous TID WC  . insulin aspart  8 Units Subcutaneous BID  . metoprolol tartrate  25 mg Oral BID  . midazolam      . nitroGLYCERIN  1 inch Topical Once  . nitroGLYCERIN      . omega-3 acid ethyl esters  1 g Oral Daily  . pantoprazole  40 mg Oral Daily  . pneumococcal 23 valent vaccine  0.5 mL Intramuscular Tomorrow-1000  . ramipril  10 mg Oral Daily  . vitamin C  500 mg Oral Daily  . DISCONTD: amLODipine  5 mg Oral Daily   Infusions:    . sodium chloride    . diltiazem (CARDIZEM) infusion    . DISCONTD: sodium chloride 10 mL/hr (03/08/12 1500)  . DISCONTD: sodium chloride 75 mL/hr at 03/09/12 0828  . DISCONTD:  heparin Stopped (03/09/12 1000)    Assessment: 66 yo M with new afib to start coumadin with no bridging. Per MD, will be on amoxil x 3 more days for tooth infection.  S/p cath with angioplasty and BMS 10/16 - plavix x 1-3 months.  Goal of Therapy:  INR 2-3 Monitor platelets by anticoagulation protocol: Yes   Plan:  - Coumadin 5 mg po qday (dosing conservatively with abx on board, plan for a few days tx prior to initial INR check if d/c home today) - Daily INR while inpatient - Provide Coumadin education - Plan is for INR Monday as outpatient  Jill Side L. Illene Bolus, PharmD, BCPS Clinical Pharmacist Pager: [phone] Pharmacy: [phone] 03/10/2012 9:29 AM

## 2012-03-10 NOTE — Discharge Summary (Signed)
Discharge Summary   Patient ID: Seth Hardy MRN: [id number], DOB/AGE: 1946-05-10 66 y.o. Admit date: 03/08/2012 D/C date:     03/10/2012  Primary Cardiologist: Wall  Primary Discharge Diagnoses:  1. NSTEMI s/p BMS to RCA 03/09/12 2. CAD - cath this admission as above - hx: CABGx4 2011 - plan is to continue Plavix 1-3 months (preferably 3 per Dr. Jari Sportsman note) along with baby aspirin, Coumadin for now 3. Newly recognized PAF with RVR - initiated on Coumadin 4. Carotid disease, bilateral with 40-59% stenosis 03/2011, asymptomatic  - f/u carotid dopplers are in the system for November 2013 5. Aortic stenosis, mild by cath 03/09/12 6. Diabetes mellitus type 2 - variable blood sugars this admission (including low CBG of 56), Metformin/Lantus held at discharge, instructed to f/u PCP 7. HTN 8. Recent tooth extraction on amoxicillin 9. Hyperlipidemia - statin changed to Lipitor given use of Norvasc  Secondary Discharge Diagnoses:  1. Obesity  Hospital Course: Seth Hardy is a 66 y/o M with hx of CAD s/p CABGx4 2011, HTN, DM, mild AS, and carotid disease who presented to [facility] 03/08/12 with complaints of chest pain and was found to have NSTEMI and newly recognized rapid atrial fibrillation. He was in his usual state of health on day of admission when he developed nausea, CP, and tachypalpitations with HR in the 140's. EKG demonstrated AF-RVR 132BPM with 3-75mm ST depression in II, III, aVF, V3-V6. While in the ED, he converted to sinus rhythm but cont to have c/p and occasional paroxysmal afib. He received 10mg  diltiazem bolus, 5mg  IV lopressor, and 150mg  of amiodarone and HR was running at 45 BPM at time of cardiology evaluation. Heparin was started. He ruled for MI with peak troponin of 2.77. TSH was WNL. Due to the significant bump in his enzymes, he underwent cath yesterday demonstrating patent LIMA to LAD and SVG to OM 2. SVG to diagonal was occluded but the area is  supplied via collaterals from the distal LAD. SVG to RCA was occluded with significant proximal disease in the native RCA. He had normal LV function with minimal gradient across AV. He underwent subsequent successful angioplasty and bare-metal stent placement to the proximal right coronary artery. Dr. Kirke Corin recommended Plavix for at least 1 month and preferably for 3 months. BMS was chosen due to need for anticoagulation with warfarin. Amlodipine was added for BP control due to accelerated HTN, and later increased this AM as his BP was still running high. Today he is doing well. He has had no further atrial fib overnight. Dr. Patty Sermons has seen and examined him and feels he is stable for discharge. 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 - DM management per Dr. Welton Flakes in [place] - continue BB, ACE, statin, and higher dose of norvasc We will ambulate the patient and recheck his BP later this AM. If he is stable, he will be discharged. Since his work is out this way, he prefers to follow up in [place] long term including Coumadin clinic.  Also of note, the patient's Metformin and Lantus were stopped while in the hospital. He had slightly low blood sguar near admission. With these medicines held, he had  fairly controlled blood sugars. (I.e. This AM - CBG 83.) He will continue Amaryl and Humalog 8 units BID with meals as he was on here. He was instructed to contact Dr. Welton Flakes regarding further instructions.  Discharge Vitals: Blood pressure 148/60, pulse 55, temperature 98.4 F (36.9 C), temperature source Oral, resp. rate 19, height 5\' 8"  (1.727 m), weight 180 lb 8.9 oz (81.9 kg), SpO2  99.00%.  Labs: Lab Results  Component Value Date   WBC 5.6 03/10/2012   HGB 13.6 03/10/2012   HCT 41.7 03/10/2012   MCV 91.6 03/10/2012   PLT 172 03/10/2012     Lab 03/10/12 0438 03/09/12 0243  NA 141 --  K 4.2 --  CL 102 --  CO2 28 --  BUN 9 --  CREATININE 0.87 --  CALCIUM 9.4 --  PROT -- 6.2  BILITOT -- 0.3  ALKPHOS -- 68  ALT -- 25  AST -- 29  GLUCOSE 83 --    Basename 03/09/12 0856 03/09/12 0244 03/08/12 2039 03/08/12 1501  CKTOTAL -- -- -- --  CKMB -- -- -- --  TROPONINI 1.33* 1.72* 2.77* 1.63*   Lab Results  Component Value Date   CHOL 99 03/09/2012   HDL 37* 03/09/2012   LDLCALC 45 03/09/2012   TRIG 84 03/09/2012    Diagnostic Studies/Procedures   1. Chest Port 1 View 03/08/2012  *RADIOLOGY REPORT*  Clinical Data: Shortness of breath.  Chest pain.  Nonsmoker. Diabetic.  PORTABLE CHEST - 1 VIEW  Comparison: 03/19/2010.  Findings: Post CABG.  Heart size top normal to slightly enlarged.  Central pulmonary vascular prominence.  No segmental infiltrate or gross pneumothorax.  Limit evaluation of the aorta which appears minimally tortuous.  The patient would eventually benefit from follow-up two-view chest with cardiac leads removed.  IMPRESSION: Post CABG.  Heart size top normal to slightly enlarged.  Central pulmonary vascular prominence.  Please see above.   Original Report Authenticated By: Fuller Canada, M.D.   2. Cath Procedural Findings:  Hemodynamics:  AO: 186/69 mmHg  LV: 189/14 mmHg  LVEDP: 40 mmHg  Coronary angiography:  Coronary dominance: Right  Left Main: Normal in size and mildly calcified with 20% distal stenosis.  Left Anterior Descending (LAD): Significantly calcified throughout its course mostly in the proximal and midsegment. The vessel is occluded in the midsegment.  1st diagonal (D1): Appears to be occluded with faint collaterals.  2nd diagonal (D2): Occluded with collaterals coming from distal LAD.  Circumflex (LCx): Normal in size and  nondominant. There is a 60-70% tubular stenosis proximally starting at the ostium. There is competitive flow noted into OM 2.  1st obtuse marginal: Normal in size with 50% ostial stenosis.  2nd obtuse marginal: Normal in size with 40% proximal disease. This is supplied by a patent SVG.  3rd obtuse marginal: Normal in size with minor irregularities.  Right Coronary Artery: Large in size and dominant. There is a 30% ostial stenosis followed by an 80-90% proximal Street stenosis. There is 30% diffuse disease in the midsegment. Calcifications are noted in the mid to distal segment.  posterior descending artery: Normal in size with diffuse 50-60% disease which is unchanged from before.  The posterior AV groove has a 50% ostial stenosis.  posterior lateral branch: Medium in size with mild diffuse atherosclerosis. SVG to OM 2: Patent but appears to be small in size likely due to competitive antegrade flow. This is a jump graft that also includes and SVG to diagonal which has very sluggish  flow.  SVG to RCA is occluded at the ostium.  LIMA to LAD is patent with no significant disease. Collaterals are noted from the distal LAD to the diagonal. There is mild diffuse disease in the distal LAD after the anastomosis.  Left ventriculography: Left ventricular systolic function is normal , LVEF is estimated at 60 %, there is no significant mitral regurgitation  PCI Procedure Note: Following the diagnostic procedure, the decision was made to proceed with PCI. The sheath was upsized to a 6 [place]. Weight-based bivalirudin was given for anticoagulation. Once a therapeutic ACT was achieved, a 6 [place] AL-1 with side holes guide catheter was inserted. A intuition coronary guidewire was used to cross the lesion. The lesion was predilated with a 2.5 x 12 balloon. The lesion was then stented with a 3.5 x 18 mm vision stent. The stent was postdilated with a 4.0 noncompliant balloon. Following PCI, there was 0% residual stenosis  and TIMI-3 flow. Final angiography confirmed an excellent result. I did not place the stent all the way back to the ostium. There was residual 30% disease at the ostium. I elected not to treat this as this would be high risk for restenosis with a bare-metal stent. If stenosis happens in that location in the future, a drug-eluting stent is preferred. Femoral hemostasis was achieved with a Mynx closure device.. The patient tolerated the PCI procedure well. There were no immediate procedural complications. The patient was transferred to the post catheterization recovery area for further monitoring.  PCI Data:  Vessel - RCA/Segment - proximal  Percent Stenosis (pre) 80-90%  TIMI-flow 3  Stent 3.5 x 18 mm vision bare-metal stent postdilated with a 4.0 noncompliant balloon  Percent Stenosis (post) 0%  TIMI-flow (post) 3  Final Conclusions:  1. Significant three-vessel coronary artery disease with patent LIMA to LAD and SVG to OM 2. SVG to diagonal is occluded but the area is supplied via collaterals from the distal LAD. SVG to RCA is occluded with significant proximal disease in the native RCA.  2. Significantly elevated systemic hypertension with significant elevation in left ventricular end-diastolic pressure.  3. Normal LV systolic function with minimal gradient across the aortic valve.  4. Successful angioplasty and bare-metal stent placement to the proximal right coronary artery.  Recommendations:  I recommend Plavix for at least one month and preferably for 3 months. I elected a bare-metal stent due to the need for anticoagulation with warfarin. I will add amlodipine for blood pressure control. Warfarin can likely be started tomorrow if no bleeding complications.  Discharge Medications   Current Discharge Medication List    START taking these medications   Details  amLODipine (NORVASC) 10 MG tablet Take 1 tablet (10 mg total) by mouth daily. Qty: 30 tablet, Refills: 6    atorvastatin  (LIPITOR) 20 MG tablet Take 1 tablet (20 mg total) by mouth at bedtime. Qty: 30 tablet, Refills: 6   Comments: Your previous statin cholesterol medicine (Zocor) can interact with Amlodipine, so you were changed to a different kind of statin.    clopidogrel (PLAVIX) 75 MG tablet Take 1 tablet (75 mg total) by mouth daily with breakfast. Qty: 30 tablet, Refills: 2   Comments: Talk to your doctor before refilling as you may be instructed to stop after 1-3 months.    pantoprazole (PROTONIX) 40 MG tablet Take 1 tablet (40 mg total) by mouth daily. Qty: 30 tablet, Refills: 2   Comments: Some studies suggest Nexium can interact with Plavix. Take this  medicine (Protonix) instead for less chance of interaction.    warfarin (COUMADIN) 5 MG tablet Take 1 tablet (5 mg total) by mouth as directed. For now you will start off taking 1 tablet daily. You will have an appointment to get your Coumadin level checked on 03/14/12 and they will tell you if your dose needs adjusting. Qty: 30 tablet, Refills: 6      CONTINUE these medications which have CHANGED   Details  amoxicillin (AMOXIL) 250 MG capsule Take 2 capsules (500 mg total) by mouth 4 (four) times daily. Started Wednesday post Tooth extraction. Finish the full course as prescribed by your dentist.    insulin lispro (HUMALOG) 100 UNIT/ML injection Inject 8 Units into the skin 2 (two) times daily with a meal.    nitroGLYCERIN (NITROSTAT) 0.4 MG SL tablet Place 1 tablet (0.4 mg total) under the tongue every 5 (five) minutes as needed (up to 3 doses).      CONTINUE these medications which have NOT CHANGED   Details  acetaminophen (TYLENOL) 325 MG tablet Take 650 mg by mouth every 6 (six) hours as needed. For pain    aspirin 81 MG EC tablet Take 81 mg by mouth daily.      glimepiride (AMARYL) 4 MG tablet Take 4 mg by mouth 2 (two) times daily.      HYDROcodone-acetaminophen (NORCO) 7.5-325 MG per tablet Take 1 tablet by mouth every 4 (four) hours as  needed. For pain    metoprolol tartrate (LOPRESSOR) 25 MG tablet Take 1 tablet (25 mg total) by mouth 2 (two) times daily. Qty: 60 tablet, Refills: 6    Omega-3 Fatty Acids (FISH OIL) 1200 MG CAPS Take 1 capsule by mouth daily.      ramipril (ALTACE) 10 MG capsule Take 1 capsule (10 mg total) by mouth daily. Qty: 30 capsule, Refills: 11    vitamin C (ASCORBIC ACID) 500 MG tablet Take 500 mg by mouth daily.        STOP taking these medications     esomeprazole (NEXIUM) 40 MG capsule Comments:  Reason for Stopping:       insulin glargine (LANTUS) 100 UNIT/ML injection Comments:  Reason for Stopping:       metFORMIN (GLUCOPHAGE) 1000 MG tablet Comments:  Reason for Stopping:       simvastatin (ZOCOR) 40 MG tablet Comments:  Reason for Stopping:          Disposition   The patient will be discharged in stable condition to home. Discharge Orders    Future Appointments: Provider: Department: Dept Phone: Center:   03/14/2012 10:30 AM [facility] [phone] None   03/23/2012 10:00 AM Dyann Kief, PA [facility] [phone] [facility]   03/30/2012 2:30 PM [facility] [phone] None   03/30/2012 3:30 PM [facility] [phone] [facility]   04/13/2012 10:30 AM [facility] [phone] None     Future Orders Please Complete By Expires   Amb Referral to Cardiac Rehabilitation      Diet - low sodium heart healthy      Comments:   Diabetic Diet   Increase activity slowly      Comments:   No driving for 1 week. No lifting over 5 lbs for 2 weeks. No sexual activity for 2 weeks. 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 Keep procedure site clean & dry. If you notice  increased pain, swelling, bleeding or pus, call/return!  You may shower, but no soaking baths/hot tubs/pools for 1 week.   Discharge instructions       Comments:   You had some low blood sugar readings while in the hospital so your Lantus and Metformin were not continued. Please contact your primary doctor managing your diabetes to discuss a plan for following your blood sugars with these changes.  Check your blood pressure regularly. If you are finding readings higher than 130 on the top number or 80 on the bottom number, call Dr. Vern Claude office.     Follow-up Information    Follow up with Your dentist. (As previously instructed)       Follow up with Dr. Welton Flakes in [place]. (For your continued diabetes management - see below for discharge instructions due to medicine changes)       Follow up with [facility]. (Coumadin Clinic Appointment 03/14/12 at 10:30am)    Contact information:   [employer] [address] [place], [address] [place] [address] [phone]      Follow up with Jacolyn Reedy, PA. (03/23/12 at 10am)    Contact information:   [employer] [address] [place], [address] [place] [address] [phone]            Duration of Discharge Encounter: Greater than 30 minutes including physician and PA time.  Signed, Kriste Basque Parrie Rasco PA-C 03/10/2012, 12:01 PM

## 2012-03-10 NOTE — Progress Notes (Addendum)
Subjective:  The patient has remained in NSR.  No further atrial fib. BP remaining high.  Will increase amlodipine to 10 mg daily. Patient underwent bare metal stent to proximal RCA yesterday. Tolerated well.  No further chest discomfort. EKG today stable.   Objective:  Vital Signs in the last 24 hours: Temp:  [97.5 F (36.4 C)-98.6 F (37 C)] 98.2 F (36.8 C) (10/17 0717) Pulse Rate:  [49-63] 63  (10/17 0717) Resp:  [11-22] 19  (10/16 1500) BP: (119-195)/(50-75) 183/59 mmHg (10/17 0700) SpO2:  [88 %-100 %] 99 % (10/17 0717)  Intake/Output from previous day: 10/16 0701 - 10/17 0700 In: 1730 [P.O.:360; I.V.:1370] Out: 2275 [Urine:2275] Intake/Output from this shift:       . amLODipine  10 mg Oral Daily  . amoxicillin  500 mg Oral QID  . aspirin  324 mg Oral Pre-Cath  . aspirin EC  81 mg Oral Daily  . atorvastatin  20 mg Oral q1800  . bivalirudin      . clopidogrel      . clopidogrel  75 mg Oral Q breakfast  . diazepam  5 mg Oral On Call  . fentaNYL      . glimepiride  4 mg Oral BID WC  . heparin      . hydrALAZINE      . hydrALAZINE  10 mg Intravenous Once  . influenza  inactive virus vaccine  0.5 mL Intramuscular Tomorrow-1000  . insulin aspart  0-15 Units Subcutaneous TID WC  . insulin aspart  8 Units Subcutaneous BID  . metoprolol tartrate  25 mg Oral BID  . midazolam      . nitroGLYCERIN  1 inch Topical Once  . nitroGLYCERIN      . omega-3 acid ethyl esters  1 g Oral Daily  . pantoprazole  40 mg Oral Daily  . pneumococcal 23 valent vaccine  0.5 mL Intramuscular Tomorrow-1000  . ramipril  10 mg Oral Daily  . vitamin C  500 mg Oral Daily  . DISCONTD: amLODipine  5 mg Oral Daily  . DISCONTD: aspirin EC  81 mg Oral Daily  . DISCONTD: regadenoson  0.4 mg Intravenous Once      . sodium chloride    . diltiazem (CARDIZEM) infusion    . DISCONTD: sodium chloride 10 mL/hr (03/08/12 1500)  . DISCONTD: sodium chloride 75 mL/hr at 03/09/12 0828  . DISCONTD:  heparin Stopped (03/09/12 1000)    Physical Exam: The patient appears to be in no distress.  Head and neck exam reveals that the pupils are equal and reactive.  The extraocular movements are full.  There is no scleral icterus.  Mouth and pharynx are benign.  No lymphadenopathy.  No carotid bruits.  The jugular venous pressure is normal.  Thyroid is not enlarged or tender.  Chest is clear to percussion and auscultation.  No rales or rhonchi.  Expansion of the chest is symmetrical.  Heart reveals no abnormal lift or heave.  First and second heart sounds are normal.  There is no  gallop rub or click.  There is a grade 2/6 systolic ejection murmur consistent with aortic stenosis  The abdomen is soft and nontender.  Bowel sounds are normoactive.  There is no hepatosplenomegaly or mass.  There are no abdominal bruits. Groin shows no hematoma.  Extremities reveal no phlebitis or edema.  Pedal pulses are good.  There is no cyanosis or clubbing.  Good radial and pedal pulses.  Neurologic exam is normal  strength and no lateralizing weakness.  No sensory deficits.  Integument reveals no rash  Lab Results:  Basename 03/10/12 0438 03/09/12 0243  WBC 5.6 7.5  HGB 13.6 12.3*  PLT 172 196    Basename 03/10/12 0438 03/09/12 0243  NA 141 137  K 4.2 4.5  CL 102 103  CO2 28 28  GLUCOSE 83 56*  BUN 9 12  CREATININE 0.87 0.90    Basename 03/09/12 0856 03/09/12 0244  TROPONINI 1.33* 1.72*   Hepatic Function Panel  Basename 03/09/12 0243  PROT 6.2  ALBUMIN 3.4*  AST 29  ALT 25  ALKPHOS 68  BILITOT 0.3  BILIDIR --  IBILI --    Basename 03/09/12 0243  CHOL 99   No results found for this basename: PROTIME in the last 72 hours  Imaging: Imaging results have been reviewed.   Cardiac Studies: Telemetry shows sinus bradycardia and anterolateral T wave inversion Assessment/Plan:  Patient Active Hospital Problem List: 1.  Paroxysmal atrial fibrillation with rapid ventricular  response, resolved 2.  ischemic heart disease status post CABG x4 in September 2011 3. non-STEMI with elevated troponin 2.77 trending down. Successful BMS to RCA yesterday. 4. aortic stenosis, mild by echocardiogram 03/2011, mild by cath yesterday. 5. carotid disease, bilateral with bilateral 40-59% stenosis by ultrasound November 2012, asymptomatic 6. Hypertension---amlodipine added this admission. 7. diabetes mellitus type 2, controlled  Plan: Cardiac rehab.  Ambulate in unit today. Discharge later today if stable.  He has been in the outpatient cardiac rehab program after his CABG and is interested in rejoining program. Start warfarin today per pharmacy protocol. No bridging. Aim low initially because he will be on amoxil for another 3 days (total of 10 days from dentist).  He may continue to use his vicodin at home for tooth pain. Plavix for at least one month and preferable 3 month per Dr. Jari Sportsman cath note. Continue baby aspirin. He should get INR at [facility] next Monday. See Dr. Daleen Squibb or NP/PA in office 1-2 weeks. 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 Continue BB, ACE, statin. Diabetes management per Dr. Welton Flakes in [place].  LOS: 2 days    [name] 03/10/2012, 7:44 AM

## 2012-03-10 NOTE — Telephone Encounter (Signed)
Wife called regarding fever. Pt had PNA and flu vaccine today. Mounted temp elevation initially of 99.7 then 100.4. Feels feverish but no other acute symptoms. Wife wanted to know if he can take Tylenol along with his Vicodin which has 325mg  per tablet in it. I advised no more than 1g of acetaminophen total per 6 hours. She will keep an eye on how he is looking and feeling. If he begins to feel worse including any CP, SOB, or any other symptoms worrisome to them, she noted will take him to the ER. Otherwise she already has an appt scheduled with their PCP tomorrow AM.  Ronie Spies PA-C

## 2012-03-10 NOTE — Progress Notes (Signed)
CARDIAC REHAB PHASE I   PRE:  Rate/Rhythm: 60SR  BP:  Supine:   Sitting: 131/55  Standing:    SaO2:   MODE:  Ambulation: 700 ft   POST:  Rate/Rhythem: 64SR  BP:  Supine:   Sitting: 148/60  Standing:    SaO2:  1100-1200 Pt walked 700 ft on RA with steady gait. Tolerated well. Remained in NSR during walk. Denied CP. Education completed with pt and wife. Permission given to refer to [facility] Phase 2. To recliner after walk.  Seth Hardy

## 2012-03-10 NOTE — Telephone Encounter (Signed)
Per Kriste Basque this is a transitional care pt

## 2012-03-14 ENCOUNTER — Ambulatory Visit (INDEPENDENT_AMBULATORY_CARE_PROVIDER_SITE_OTHER): Payer: Medicare Other | Admitting: *Deleted

## 2012-03-14 DIAGNOSIS — Z7901 Long term (current) use of anticoagulants: Secondary | ICD-10-CM | POA: Insufficient documentation

## 2012-03-14 LAB — POCT INR: INR: 2.4

## 2012-03-14 NOTE — Patient Instructions (Signed)

## 2012-03-18 ENCOUNTER — Ambulatory Visit (INDEPENDENT_AMBULATORY_CARE_PROVIDER_SITE_OTHER): Payer: Medicare Other

## 2012-03-18 DIAGNOSIS — Z7901 Long term (current) use of anticoagulants: Secondary | ICD-10-CM

## 2012-03-18 LAB — POCT INR: INR: 1.7

## 2012-03-23 ENCOUNTER — Encounter: Payer: Self-pay | Admitting: Physician Assistant

## 2012-03-23 ENCOUNTER — Ambulatory Visit (INDEPENDENT_AMBULATORY_CARE_PROVIDER_SITE_OTHER): Payer: Medicare Other | Admitting: Physician Assistant

## 2012-03-23 ENCOUNTER — Other Ambulatory Visit: Payer: Self-pay | Admitting: *Deleted

## 2012-03-23 VITALS — BP 149/63 | HR 43 | Ht 68.0 in | Wt 183.7 lb

## 2012-03-23 DIAGNOSIS — I359 Nonrheumatic aortic valve disorder, unspecified: Secondary | ICD-10-CM

## 2012-03-23 DIAGNOSIS — I2510 Atherosclerotic heart disease of native coronary artery without angina pectoris: Secondary | ICD-10-CM

## 2012-03-23 DIAGNOSIS — I6529 Occlusion and stenosis of unspecified carotid artery: Secondary | ICD-10-CM

## 2012-03-23 DIAGNOSIS — I350 Nonrheumatic aortic (valve) stenosis: Secondary | ICD-10-CM

## 2012-03-23 DIAGNOSIS — I4891 Unspecified atrial fibrillation: Secondary | ICD-10-CM | POA: Insufficient documentation

## 2012-03-23 DIAGNOSIS — I10 Essential (primary) hypertension: Secondary | ICD-10-CM

## 2012-03-23 NOTE — Assessment & Plan Note (Signed)
Patient had mild aortic stenosis on cardiac catheterization. He is scheduled for followup 2-D echo later that in November.

## 2012-03-23 NOTE — Assessment & Plan Note (Signed)
Patient had one episode of atrial fibrillation with rapid ventricular response in the hospital. This resolved but he was placed on Coumadin. He is currently taking Lopressor 25 mg b.i.d. Which he was on prior to admission. His heart rate is 44. He says his heart rate is usually in the 50s. He is not dizzy. He will keep track of his heart rate at home and call us if it goes below 40, or if he becomes symptomatic.If he doesn't have any further episodes of atrial fibrillation we will probably stop his Coumadin. He will see Dr. Daleen Squibb in followup to discuss this.

## 2012-03-23 NOTE — Progress Notes (Signed)
HPI:  This is a pleasant 66 year old male patient who was admitted to the hospital on 03/08/12 with a non-ST elevation MI treated with successful bare metal stent to the RCA. He had significant three-vessel coronary artery disease with patent LIMA to the LAD and patent SVG to the OM. SVG to the diagonal is occluded but the area supplied via collaterals from the distal LAD. He SVG to the RCA is occluded with significant proximal disease in the native RCA which is where the bare-metal stent was placed. He also had significantly elevated stomach hypertension with significant elevation in left ventricular end-diastolic pressure. Had normal LV systolic function with minimal gradient across the aortic valve. Plavix was recommended for at least one month but preferably 3 months. He also had one episode of atrial fibrillation and was placed on Coumadin. Amlodipine was added for hypertension.  The patient is doing well post MI. He is walking twice a day without symptoms. He denies any chest pain, palpitations, dyspnea, dyspnea exertion, dizziness, or presyncope. 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 The season is over in 2 weeks.  No Known Allergies  Current Outpatient Prescriptions on File Prior to Visit: acetaminophen (TYLENOL) 325 MG tablet, Take 650 mg by mouth every 6 (six) hours as needed. For pain, Disp: , Rfl:  amLODipine (NORVASC) 10 MG tablet, Take 1 tablet (10 mg total) by mouth daily., Disp: 30 tablet, Rfl: 6 aspirin 81 MG EC tablet, Take 81 mg by mouth daily.  , Disp: , Rfl:  atorvastatin (LIPITOR) 20 MG tablet, Take 1 tablet (20 mg total) by mouth at bedtime., Disp: 30 tablet, Rfl: 6 clopidogrel (PLAVIX) 75 MG tablet, Take 1 tablet (75 mg total) by mouth daily with breakfast., Disp: 30 tablet, Rfl: 2 glimepiride (AMARYL) 4 MG tablet, Take 4 mg by  mouth 2 (two) times daily.  , Disp: , Rfl:  insulin glargine (LANTUS) 100 UNIT/ML injection, Inject 20 Units into the skin daily., Disp: , Rfl:  insulin lispro (HUMALOG) 100 UNIT/ML injection, Inject 8 Units into the skin 2 (two) times daily with a meal., Disp: , Rfl:  metFORMIN (GLUCOPHAGE) 500 MG tablet, Take 500 mg by mouth 2 (two) times daily with a meal., Disp: , Rfl:  metoprolol tartrate (LOPRESSOR) 25 MG tablet, Take 1 tablet (25 mg total) by mouth 2 (two) times daily., Disp: 60 tablet, Rfl: 6 nitroGLYCERIN (NITROSTAT) 0.4 MG SL tablet, Place 1 tablet (0.4 mg total) under the tongue every 5 (five) minutes as needed (up to 3 doses)., Disp: , Rfl:  Omega-3 Fatty Acids (FISH OIL) 1200 MG CAPS, Take 1 capsule by mouth daily.  , Disp: , Rfl:  pantoprazole (PROTONIX) 40 MG tablet, Take 1 tablet (40 mg total) by mouth daily., Disp: 30 tablet, Rfl: 2 ramipril (ALTACE) 10 MG capsule, Take 1 capsule (10 mg total) by mouth daily., Disp: 30 capsule, Rfl: 11 vitamin C (ASCORBIC ACID) 500 MG tablet, Take 500 mg by mouth daily.  , Disp: , Rfl:  warfarin (COUMADIN) 5 MG tablet, Take 1 tablet (5 mg total) by mouth as directed. For now you will start off taking 1 tablet daily. You will have an appointment to get your Coumadin level checked on 03/14/12 and they will tell you if your dose needs adjusting., Disp: 30 tablet, Rfl: 6    Past Medical History:  CAD (coronary artery disease)                                  Comment:a. 01/2010 CABG x 4: LIMA->LAD, VG->D1, VG->OM1,              VG->PDA. b. NSTEMI 02/2012 in setting of AF-RVR              with cath s/p BMS to RCA (plan for 1 month,               possibly up to 3 months of Plavix)   HTN (hypertension)                                           Hyperlipidemia                                               Diabetes mellitus type II, controlled                          Comment:a. Variable CBG 02/2012 - several meds               adjusted.   Mild aortic  stenosis                                           Comment:a. 03/2011 Echo: EF 55-60%, Mild AS. b. Mild by              cath 02/2012.   Carotid disease, bilateral                                     Comment:a. 03/2011 U/S: 40-59% bilat Carotid dzs   Obesity                                                      GERD (gastroesophageal reflux disease)                       PAF (paroxysmal atrial fibrillation)                           Comment:a. Newly dx 02/2012 & initiated on Coumadin               (spont converted to NSR).  Past Surgical History:   cyst L kidney                                                ANGIOPLASTY  CORONARY ARTERY BYPASS GRAFT                                 CARDIAC CATHETERIZATION                                      EYE SURGERY                                                  HERNIA REPAIR                                               Review of patient's family history indicates:   COPD                           Mother                     Comment: alive 73   Other                          Father                     Comment: alive 34   Social History   Marital Status: Married             Spouse Name:                      Years of Education:                 Number of children:             Occupational History   None on file  Social History Main Topics   Smoking Status: Never Smoker                     Smokeless Status: Not on file                      Comment: tobacco use - no   Alcohol Use: Yes               Comment: rare drink   Drug Use: No             Sexual Activity: Yes                Other Topics            Concern   None on file  Social History Narrative   Full time. Gets regular exercise. Lives in [place] with his wife.  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    ROS:see history of present illness as well as negative   PHYSICAL EXAM: Well-nournished, in no acute distress. Neck:bruit  versus murmur portrayed, No JVD, HJR, or thyroid enlargement  Lungs: No tachypnea, clear without wheezing, rales, or rhonchi  Cardiovascular: RRR, PMI not displaced, 2-3/6 systolic murmur at the right sternal border and left sternal border, no bruit, thrill, or heave.  Abdomen: BS normal. Soft  without organomegaly, masses, lesions or tenderness.  Extremities: right groin without hematoma or hemorrhage, slight bruising at catheter site, lower extremities without cyanosis, clubbing or edema. Good distal pulses bilateral  SKin: Warm, no lesions or rashes   Musculoskeletal: No deformities  Neuro: no focal signs  BP 149/63  Pulse 43  Ht 5\' 8"  (1.727 m)  Wt 183 lb 11.2 oz (83.326 kg)  BMI 27.93 kg/m2   AVW:[id number] fibrillation and 44 beats per minute with LVH and repolarization changes

## 2012-03-23 NOTE — Assessment & Plan Note (Signed)
Patch and is status post CABG x4 in September 2011. He suffered a non-ST elevation MI on 03/08/12 and was treated with bare-metal stent to the native proximal RCA. Please see above for cardiac catheterization details. Plavix is recommended at least for one month but preferably 3 months. He is also on Coumadin for atrial fibrillation and baby aspirin. Patient knows he is at higher risk for bleeding and is aware of what he should watch out for. 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 He is only 2 weeks out from his MI. 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

## 2012-03-23 NOTE — Patient Instructions (Addendum)
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  If your pulse is below 40, please call our office at [phone].  2 Gram Low Sodium Diet A 2 gram sodium diet restricts the amount of sodium in the diet to no more than 2 g or 2000 mg daily. Limiting the amount of sodium is often used to help lower blood pressure. It is important if you have heart, liver, or kidney problems. Many foods contain sodium for flavor and sometimes as a preservative. When the amount of sodium in a diet needs to be low, it is important to know what to look for when choosing foods and drinks. The following includes some information and guidelines to help make it easier for you to adapt to a low sodium diet. QUICK TIPS  Do not add salt to food.  Avoid convenience items and fast food.  Choose unsalted snack foods.  Buy lower sodium products, often labeled as "lower sodium" or "no salt added."  Check food labels to learn how much sodium is in 1 serving.  When eating at a restaurant, ask that your food be prepared with less salt or none, if possible. READING FOOD LABELS FOR SODIUM INFORMATION The nutrition facts label is a good place to find how much sodium is in foods. Look for products with no more than 500 to 600 mg of sodium per meal and no more than 150 mg per serving. Remember that 2 g = 2000 mg. The food label may also list foods as:  Sodium-free: Less than 5 mg in a serving.  Very low sodium: 35 mg or less in a serving.  Low-sodium: 140 mg or less in a serving.  Light in sodium: 50% less sodium in a serving. For example, if a food that usually has 300 mg of sodium is changed to become light in sodium, it will have 150 mg of sodium.  Reduced sodium: 25% less sodium in a serving. For example,  if a food that usually has 400 mg of sodium is changed to reduced sodium, it will have 300 mg of sodium. CHOOSING FOODS Grains  Avoid: Salted crackers and snack items. Some cereals, including instant hot cereals. Bread stuffing and biscuit mixes. Seasoned rice or pasta mixes.  Choose: Unsalted snack items. Low-sodium cereals, oats, puffed wheat and rice, shredded wheat. English muffins and bread. Pasta. Meats  Avoid: Salted, canned, smoked, spiced, pickled meats, including fish and poultry. Bacon, ham, sausage, cold cuts, hot dogs, anchovies.  Choose: Low-sodium canned tuna and salmon. Fresh or frozen meat, poultry, and fish. Dairy  Avoid: Processed cheese and spreads. Cottage cheese. Buttermilk and condensed milk. Regular cheese.  Choose: Milk. Low-sodium cottage cheese. Yogurt. Sour cream. Low-sodium cheese. Fruits and Vegetables  Avoid: Regular canned vegetables. Regular canned tomato sauce and paste. Frozen vegetables in sauces. Olives. Rosita Fire. Relishes. Sauerkraut.  Choose: Low-sodium canned vegetables. Low-sodium tomato sauce and paste. Frozen or fresh vegetables. Fresh and frozen fruit. Condiments  Avoid: Canned and packaged gravies. Worcestershire sauce. Tartar sauce. Barbecue sauce. Soy sauce. Steak sauce. Ketchup. Onion, garlic, and table salt. Meat flavorings and tenderizers.  Choose: Fresh and dried herbs and spices. Low-sodium varieties of mustard and ketchup. Lemon juice. Tabasco sauce. Horseradish. SAMPLE 2 GRAM SODIUM MEAL PLAN Breakfast /  Sodium (mg)  1 cup low-fat milk / 143 mg  2 slices whole-wheat toast / 270 mg  1 tbs heart-healthy margarine / 153 mg  1 hard-boiled egg / 139 mg  1 small orange / 0 mg Lunch / Sodium (mg)  1 cup raw carrots / 76 mg   cup hummus / 298 mg  1 cup low-fat milk / 143 mg   cup red grapes / 2 mg  1 whole-wheat pita bread / 356 mg Dinner / Sodium (mg)  1 cup whole-wheat pasta / 2 mg  1 cup low-sodium tomato sauce /  73 mg  3 oz lean ground beef / 57 mg  1 small side salad (1 cup raw spinach leaves,  cup cucumber,  cup yellow bell pepper) with 1 tsp olive oil and 1 tsp red wine vinegar / 25 mg Snack / Sodium (mg)  1 container low-fat vanilla yogurt / 107 mg  3 graham cracker squares / 127 mg Nutrient Analysis  Calories: 2033  Protein: 77 g  Carbohydrate: 282 g  Fat: 72 g  Sodium: 1971 mg Document Released: 05/11/2005 Document Revised: 08/03/2011 Document Reviewed: 08/12/2009 [facility] Patient Information 2013 [place], [place].

## 2012-03-23 NOTE — Assessment & Plan Note (Signed)
Patient scheduled for followup Dopplers in November.

## 2012-03-25 ENCOUNTER — Ambulatory Visit (INDEPENDENT_AMBULATORY_CARE_PROVIDER_SITE_OTHER): Payer: Medicare Other | Admitting: *Deleted

## 2012-03-25 DIAGNOSIS — Z7901 Long term (current) use of anticoagulants: Secondary | ICD-10-CM

## 2012-03-25 LAB — POCT INR: INR: 2.4

## 2012-03-30 ENCOUNTER — Other Ambulatory Visit (INDEPENDENT_AMBULATORY_CARE_PROVIDER_SITE_OTHER): Payer: Medicare Other

## 2012-03-30 ENCOUNTER — Encounter (INDEPENDENT_AMBULATORY_CARE_PROVIDER_SITE_OTHER): Payer: Medicare Other

## 2012-03-30 ENCOUNTER — Ambulatory Visit (INDEPENDENT_AMBULATORY_CARE_PROVIDER_SITE_OTHER): Payer: Medicare Other | Admitting: *Deleted

## 2012-03-30 DIAGNOSIS — I4891 Unspecified atrial fibrillation: Secondary | ICD-10-CM

## 2012-03-30 DIAGNOSIS — Z7901 Long term (current) use of anticoagulants: Secondary | ICD-10-CM

## 2012-03-30 DIAGNOSIS — I779 Disorder of arteries and arterioles, unspecified: Secondary | ICD-10-CM

## 2012-03-30 DIAGNOSIS — I6529 Occlusion and stenosis of unspecified carotid artery: Secondary | ICD-10-CM

## 2012-03-30 LAB — BASIC METABOLIC PANEL
BUN: 17 mg/dL (ref 6–23)
CO2: 26 mEq/L (ref 19–32)
Calcium: 8.9 mg/dL (ref 8.4–10.5)
Chloride: 102 mEq/L (ref 96–112)
Creatinine, Ser: 1 mg/dL (ref 0.4–1.5)
GFR: 76.79 mL/min (ref >60.00)
Glucose, Bld: 232 mg/dL — ABNORMAL HIGH (ref 70–99)
Potassium: 4.2 mEq/L (ref 3.5–5.1)
Sodium: 137 mEq/L (ref 135–145)

## 2012-03-30 LAB — POCT INR: INR: 2.6

## 2012-03-31 ENCOUNTER — Ambulatory Visit (HOSPITAL_COMMUNITY): Payer: Medicare Other

## 2012-04-04 ENCOUNTER — Ambulatory Visit (HOSPITAL_COMMUNITY): Payer: Medicare Other

## 2012-04-06 ENCOUNTER — Ambulatory Visit (HOSPITAL_COMMUNITY): Payer: Medicare Other

## 2012-04-07 ENCOUNTER — Ambulatory Visit (HOSPITAL_COMMUNITY): Payer: Medicare Other

## 2012-04-08 ENCOUNTER — Ambulatory Visit (HOSPITAL_COMMUNITY): Payer: Medicare Other

## 2012-04-11 ENCOUNTER — Ambulatory Visit (HOSPITAL_COMMUNITY): Payer: Medicare Other

## 2012-04-13 ENCOUNTER — Ambulatory Visit (HOSPITAL_COMMUNITY): Payer: Medicare Other

## 2012-04-13 ENCOUNTER — Ambulatory Visit (INDEPENDENT_AMBULATORY_CARE_PROVIDER_SITE_OTHER): Payer: Medicare Other | Admitting: *Deleted

## 2012-04-13 ENCOUNTER — Ambulatory Visit (HOSPITAL_COMMUNITY): Payer: Medicare Other | Attending: Cardiology

## 2012-04-13 DIAGNOSIS — E785 Hyperlipidemia, unspecified: Secondary | ICD-10-CM | POA: Insufficient documentation

## 2012-04-13 DIAGNOSIS — I359 Nonrheumatic aortic valve disorder, unspecified: Secondary | ICD-10-CM | POA: Insufficient documentation

## 2012-04-13 DIAGNOSIS — I779 Disorder of arteries and arterioles, unspecified: Secondary | ICD-10-CM | POA: Insufficient documentation

## 2012-04-13 DIAGNOSIS — I10 Essential (primary) hypertension: Secondary | ICD-10-CM | POA: Insufficient documentation

## 2012-04-13 DIAGNOSIS — Z7901 Long term (current) use of anticoagulants: Secondary | ICD-10-CM

## 2012-04-13 DIAGNOSIS — E119 Type 2 diabetes mellitus without complications: Secondary | ICD-10-CM | POA: Insufficient documentation

## 2012-04-13 DIAGNOSIS — I350 Nonrheumatic aortic (valve) stenosis: Secondary | ICD-10-CM

## 2012-04-13 LAB — POCT INR: INR: 2.2

## 2012-04-13 NOTE — Progress Notes (Signed)
Echocardiogram performed.  

## 2012-04-15 ENCOUNTER — Ambulatory Visit (HOSPITAL_COMMUNITY): Payer: Medicare Other

## 2012-04-18 ENCOUNTER — Ambulatory Visit (HOSPITAL_COMMUNITY): Payer: Medicare Other

## 2012-04-20 ENCOUNTER — Ambulatory Visit (HOSPITAL_COMMUNITY): Payer: Medicare Other

## 2012-04-22 ENCOUNTER — Ambulatory Visit (HOSPITAL_COMMUNITY): Payer: Medicare Other

## 2012-04-25 ENCOUNTER — Ambulatory Visit (HOSPITAL_COMMUNITY): Payer: Medicare Other

## 2012-04-27 ENCOUNTER — Ambulatory Visit (HOSPITAL_COMMUNITY): Payer: Medicare Other

## 2012-04-29 ENCOUNTER — Ambulatory Visit (HOSPITAL_COMMUNITY): Payer: Medicare Other

## 2012-05-02 ENCOUNTER — Ambulatory Visit (HOSPITAL_COMMUNITY): Payer: Medicare Other

## 2012-05-03 ENCOUNTER — Ambulatory Visit (INDEPENDENT_AMBULATORY_CARE_PROVIDER_SITE_OTHER): Payer: Medicare Other | Admitting: *Deleted

## 2012-05-03 DIAGNOSIS — Z7901 Long term (current) use of anticoagulants: Secondary | ICD-10-CM

## 2012-05-03 LAB — POCT INR: INR: 1.9

## 2012-05-04 ENCOUNTER — Ambulatory Visit (HOSPITAL_COMMUNITY): Payer: Medicare Other

## 2012-05-06 ENCOUNTER — Ambulatory Visit (HOSPITAL_COMMUNITY): Payer: Medicare Other

## 2012-05-09 ENCOUNTER — Ambulatory Visit (HOSPITAL_COMMUNITY): Payer: Medicare Other

## 2012-05-11 ENCOUNTER — Ambulatory Visit (HOSPITAL_COMMUNITY): Payer: Medicare Other

## 2012-05-13 ENCOUNTER — Ambulatory Visit (HOSPITAL_COMMUNITY): Payer: Medicare Other

## 2012-05-16 ENCOUNTER — Ambulatory Visit (HOSPITAL_COMMUNITY): Payer: Medicare Other

## 2012-05-20 ENCOUNTER — Ambulatory Visit (HOSPITAL_COMMUNITY): Payer: Medicare Other

## 2012-05-23 ENCOUNTER — Ambulatory Visit (HOSPITAL_COMMUNITY): Payer: Medicare Other

## 2012-05-25 ENCOUNTER — Ambulatory Visit (HOSPITAL_COMMUNITY): Payer: Medicare Other

## 2012-05-25 HISTORY — PX: CARDIAC CATHETERIZATION: SHX172

## 2012-05-27 ENCOUNTER — Ambulatory Visit (HOSPITAL_COMMUNITY): Payer: Medicare Other

## 2012-05-27 ENCOUNTER — Other Ambulatory Visit: Payer: Self-pay | Admitting: Physician Assistant

## 2012-05-27 ENCOUNTER — Ambulatory Visit (INDEPENDENT_AMBULATORY_CARE_PROVIDER_SITE_OTHER): Payer: Medicare Other

## 2012-05-27 DIAGNOSIS — Z7901 Long term (current) use of anticoagulants: Secondary | ICD-10-CM

## 2012-05-27 LAB — POCT INR: INR: 1.9

## 2012-05-30 ENCOUNTER — Ambulatory Visit (HOSPITAL_COMMUNITY): Payer: Medicare Other

## 2012-06-01 ENCOUNTER — Ambulatory Visit (HOSPITAL_COMMUNITY): Payer: Medicare Other

## 2012-06-03 ENCOUNTER — Ambulatory Visit (HOSPITAL_COMMUNITY): Payer: Medicare Other

## 2012-06-06 ENCOUNTER — Ambulatory Visit (HOSPITAL_COMMUNITY): Payer: Medicare Other

## 2012-06-08 ENCOUNTER — Ambulatory Visit (HOSPITAL_COMMUNITY): Payer: Medicare Other

## 2012-06-09 ENCOUNTER — Ambulatory Visit (INDEPENDENT_AMBULATORY_CARE_PROVIDER_SITE_OTHER): Payer: Medicare Other | Admitting: Cardiology

## 2012-06-09 ENCOUNTER — Encounter: Payer: Self-pay | Admitting: Cardiology

## 2012-06-09 VITALS — BP 136/62 | HR 49 | Ht 68.0 in | Wt 189.0 lb

## 2012-06-09 DIAGNOSIS — I6529 Occlusion and stenosis of unspecified carotid artery: Secondary | ICD-10-CM

## 2012-06-09 DIAGNOSIS — I350 Nonrheumatic aortic (valve) stenosis: Secondary | ICD-10-CM

## 2012-06-09 DIAGNOSIS — I4891 Unspecified atrial fibrillation: Secondary | ICD-10-CM

## 2012-06-09 DIAGNOSIS — I2510 Atherosclerotic heart disease of native coronary artery without angina pectoris: Secondary | ICD-10-CM

## 2012-06-09 DIAGNOSIS — I25810 Atherosclerosis of coronary artery bypass graft(s) without angina pectoris: Secondary | ICD-10-CM

## 2012-06-09 DIAGNOSIS — Z7901 Long term (current) use of anticoagulants: Secondary | ICD-10-CM

## 2012-06-09 DIAGNOSIS — I359 Nonrheumatic aortic valve disorder, unspecified: Secondary | ICD-10-CM

## 2012-06-09 MED ORDER — AMLODIPINE BESYLATE 5 MG PO TABS: 5.0000 mg | ORAL_TABLET | Freq: Every day | ORAL | Status: DC

## 2012-06-09 NOTE — Patient Instructions (Addendum)
Your physician has requested that you have an echocardiogram November 2014. Echocardiography is a painless test that uses sound waves to create images of your heart. It provides your doctor with information about the size and shape of your heart and how well your heart's chambers and valves are working. This procedure takes approximately one hour. There are no restrictions for this procedure.  Your physician has requested that you have a carotid duplex in November 2014. This test is an ultrasound of the carotid arteries in your neck. It looks at blood flow through these arteries that supply the brain with blood. Allow one hour for this exam. There are no restrictions or special instructions.  Decrease your Amlodipine to 5 mg a day.  Your physician has requested that you regularly monitor and record your blood pressure readings at home. Please use the same machine at the same time of day to check your readings and record them to bring to your follow-up visit. Goal is less than 140/90  Your physician wants you to follow-up in: 1 year with Dr. Daleen Squibb. You will receive a reminder letter in the mail two months in advance. If you don't receive a letter, please call our office to schedule the follow-up appointment.

## 2012-06-09 NOTE — Assessment & Plan Note (Signed)
He remains in sinus bradycardia. He would is not one to take Coumadin. I have strongly recommended he stay on it with his significant thromboembolic risk. He may not always be symptomatic with his A. fib. All questions answered.

## 2012-06-09 NOTE — Assessment & Plan Note (Signed)
Stable. Repeat carotid Dopplers in November.

## 2012-06-09 NOTE — Progress Notes (Signed)
HPI Seth Hardy returns today for evaluation and management his history of coronary artery disease, history bypass grafting, history of a non-STEMI in the setting of paroxysmal atrial fibrillation with a rapid ventricular rate, status post bare-metal stent to right coronary artery, anticoagulation therapy, hypertension, hyperlipidemia, type 2 diabetes, bilateral carotid disease, and mild aortic stenosis.  He feels remarkably well and his only complaint is lower extremity swelling. He is on low-dose Lasix. He's also on amlodipine 10 mg per day. He remained bradycardic on low dose Lopressor. He is asymptomatic from this. He denies orthopnea, or PND. Last echocardiogram was in November showed mild aortic stenosis with good left ventricular systolic function. In addition he had carotid Dopplers which were stable. He does not want to take Coumadin.  Past Medical History  Diagnosis Date  . CAD (coronary artery disease)     a. 01/2010 CABG x 4: LIMA->LAD, VG->D1, VG->OM1, VG->PDA. b. NSTEMI 02/2012 in setting of AF-RVR with cath s/p BMS to RCA (plan for 1 month, possibly up to 3 months of Plavix)  . HTN (hypertension)   . Hyperlipidemia   . Diabetes mellitus type II, controlled     a. Variable CBG 02/2012 - several meds adjusted.  . Mild aortic stenosis     a. 03/2011 Echo: EF 55-60%, Mild AS. b. Mild by cath 02/2012.  . Carotid disease, bilateral     a. 03/2011 U/S: 40-59% bilat Carotid dzs  . Obesity   . GERD (gastroesophageal reflux disease)   . PAF (paroxysmal atrial fibrillation)     a. Newly dx 02/2012 & initiated on Coumadin (spont converted to NSR).    Current Outpatient Prescriptions  Medication Sig Dispense Refill  . acetaminophen (TYLENOL) 325 MG tablet Take 650 mg by mouth every 6 (six) hours as needed. For pain      . amLODipine (NORVASC) 10 MG tablet Take 1 tablet (10 mg total) by mouth daily.  30 tablet  6  . aspirin 81 MG EC tablet Take 81 mg by mouth daily.        Marland Kitchen atorvastatin  (LIPITOR) 20 MG tablet Take 1 tablet (20 mg total) by mouth at bedtime.  30 tablet  6  . furosemide (LASIX) 20 MG tablet Take 1 tablet by mouth every other day.      Marland Kitchen glimepiride (AMARYL) 4 MG tablet Take 4 mg by mouth 2 (two) times daily.        . insulin glargine (LANTUS) 100 UNIT/ML injection Inject 20 Units into the skin daily.      . insulin lispro (HUMALOG) 100 UNIT/ML injection Inject 8 Units into the skin 2 (two) times daily with a meal.      . metFORMIN (GLUCOPHAGE) 500 MG tablet Take 500 mg by mouth 2 (two) times daily with a meal.      . metoprolol tartrate (LOPRESSOR) 25 MG tablet Take 1 tablet (25 mg total) by mouth 2 (two) times daily.  60 tablet  6  . nitroGLYCERIN (NITROSTAT) 0.4 MG SL tablet Place 1 tablet (0.4 mg total) under the tongue every 5 (five) minutes as needed (up to 3 doses).      . Omega-3 Fatty Acids (FISH OIL) 1200 MG CAPS Take 1 capsule by mouth daily.        . pantoprazole (PROTONIX) 40 MG tablet TAKE ONE TABLET BY MOUTH ONE TIME DAILY  30 tablet  11  . ramipril (ALTACE) 10 MG capsule Take 1 capsule (10 mg total) by mouth daily.  30  capsule  11  . vitamin C (ASCORBIC ACID) 500 MG tablet Take 500 mg by mouth daily.        Marland Kitchen warfarin (COUMADIN) 5 MG tablet Take 1 tablet (5 mg total) by mouth as directed. For now you will start off taking 1 tablet daily. You will have an appointment to get your Coumadin level checked on 03/14/12 and they will tell you if your dose needs adjusting.  30 tablet  6    No Known Allergies  Family History  Problem Relation Age of Onset  . COPD Mother     alive 24  . Other Father     alive 73    History   Social History  . Marital Status: Married    Spouse Name: N/A    Number of Children: N/A  . Years of Education: N/A   Occupational History  . Not on file.   Social History Main Topics  . Smoking status: Never Smoker   . Smokeless tobacco: Not on file     Comment: tobacco use - no  . Alcohol Use: Yes     Comment: rare  drink  . Drug Use: No  . Sexually Active: Yes   Other Topics Concern  . Not on file   Social History Narrative   Full time. Gets regular exercise. Lives in [place] with his wife.  Retired HS football/basketball official.    ROS ALL NEGATIVE EXCEPT THOSE NOTED IN HPI  PE  General Appearance: well developed, well nourished in no acute distress, obese HEENT: symmetrical face, PERRLA, good dentition  Neck: no JVD, thyromegaly, or adenopathy, trachea midline Chest: symmetric without deformity Cardiac: PMI non-displaced, RRR, normal S1, S2, no gallop , soft systolic murmur left sternal border Lung: clear to ausculation and percussion Vascular: all pulses full without bruits  Abdominal: nondistended, nontender, good bowel sounds, no HSM, no bruits Extremities: no cyanosis, clubbing , 2+ giving edema, no sign of DVT, no varicosities  Skin: normal color, no rashes Neuro: alert and oriented x 3, non-focal Pysch: normal affect  EKG  BMET    Component Value Date/Time   NA 137 03/30/2012 1357   K 4.2 03/30/2012 1357   CL 102 03/30/2012 1357   CO2 26 03/30/2012 1357   GLUCOSE 232* 03/30/2012 1357   BUN 17 03/30/2012 1357   CREATININE 1.0 03/30/2012 1357   CALCIUM 8.9 03/30/2012 1357   GFRNONAA 89* 03/10/2012 0438   GFRAA >90 03/10/2012 0438    Lipid Panel     Component Value Date/Time   CHOL 99 03/09/2012 0243   TRIG 84 03/09/2012 0243   HDL 37* 03/09/2012 0243   CHOLHDL 2.7 03/09/2012 0243   VLDL 17 03/09/2012 0243   LDLCALC 45 03/09/2012 0243    CBC    Component Value Date/Time   WBC 5.6 03/10/2012 0438   RBC 4.55 03/10/2012 0438   HGB 13.6 03/10/2012 0438   HCT 41.7 03/10/2012 0438   PLT 172 03/10/2012 0438   MCV 91.6 03/10/2012 0438   MCH 29.9 03/10/2012 0438   MCHC 32.6 03/10/2012 0438   RDW 12.8 03/10/2012 0438   LYMPHSABS 1.6 02/13/2010 0000   MONOABS 0.4 02/13/2010 0000   EOSABS 0.1 02/13/2010 0000   BASOSABS 0.0 02/13/2010 0000

## 2012-06-09 NOTE — Assessment & Plan Note (Signed)
Repeat echo in November.

## 2012-06-09 NOTE — Assessment & Plan Note (Signed)
Stable. No change in secondary preventative therapy. 

## 2012-06-10 ENCOUNTER — Ambulatory Visit (HOSPITAL_COMMUNITY): Payer: Medicare Other

## 2012-06-13 ENCOUNTER — Ambulatory Visit (HOSPITAL_COMMUNITY): Payer: Medicare Other

## 2012-06-15 ENCOUNTER — Ambulatory Visit (HOSPITAL_COMMUNITY): Payer: Medicare Other

## 2012-06-17 ENCOUNTER — Ambulatory Visit (INDEPENDENT_AMBULATORY_CARE_PROVIDER_SITE_OTHER): Payer: Medicare Other | Admitting: *Deleted

## 2012-06-17 ENCOUNTER — Ambulatory Visit (HOSPITAL_COMMUNITY): Payer: Medicare Other

## 2012-06-17 DIAGNOSIS — Z7901 Long term (current) use of anticoagulants: Secondary | ICD-10-CM

## 2012-06-17 LAB — POCT INR: INR: 2.2

## 2012-06-20 ENCOUNTER — Ambulatory Visit (HOSPITAL_COMMUNITY): Payer: Medicare Other

## 2012-06-22 ENCOUNTER — Ambulatory Visit (HOSPITAL_COMMUNITY): Payer: Medicare Other

## 2012-06-24 ENCOUNTER — Ambulatory Visit (HOSPITAL_COMMUNITY): Payer: Medicare Other

## 2012-06-27 ENCOUNTER — Ambulatory Visit (HOSPITAL_COMMUNITY): Payer: Medicare Other

## 2012-06-29 ENCOUNTER — Ambulatory Visit (HOSPITAL_COMMUNITY): Payer: Medicare Other

## 2012-07-01 ENCOUNTER — Ambulatory Visit (HOSPITAL_COMMUNITY): Payer: Medicare Other

## 2012-07-04 ENCOUNTER — Ambulatory Visit (HOSPITAL_COMMUNITY): Payer: Medicare Other

## 2012-07-06 ENCOUNTER — Ambulatory Visit (HOSPITAL_COMMUNITY): Payer: Medicare Other

## 2012-07-08 ENCOUNTER — Ambulatory Visit (HOSPITAL_COMMUNITY): Payer: Medicare Other

## 2012-07-11 ENCOUNTER — Ambulatory Visit (HOSPITAL_COMMUNITY): Payer: Medicare Other

## 2012-07-13 ENCOUNTER — Ambulatory Visit (HOSPITAL_COMMUNITY): Payer: Medicare Other

## 2012-07-15 ENCOUNTER — Ambulatory Visit (HOSPITAL_COMMUNITY): Payer: Medicare Other

## 2012-07-19 ENCOUNTER — Ambulatory Visit (INDEPENDENT_AMBULATORY_CARE_PROVIDER_SITE_OTHER): Payer: Medicare Other

## 2012-07-19 DIAGNOSIS — Z7901 Long term (current) use of anticoagulants: Secondary | ICD-10-CM

## 2012-07-19 LAB — POCT INR: INR: 2.7

## 2012-08-16 ENCOUNTER — Telehealth: Payer: Self-pay | Admitting: Cardiology

## 2012-08-16 NOTE — Telephone Encounter (Signed)
New Prob  States husband is experiencing back pain, fatigue, and anxiety. Concerned and would like to speak to nurse.

## 2012-08-16 NOTE — Telephone Encounter (Signed)
Spoke with pt wife, pt has been c/o low back pain. He is also c/o of fatigue and anxiety. She reports the pt is not out of rhythm and his heart has not been racing. Wife made aware low back pain is not usually associated with the heart. She voiced understanding.

## 2012-08-17 ENCOUNTER — Ambulatory Visit (INDEPENDENT_AMBULATORY_CARE_PROVIDER_SITE_OTHER): Payer: Medicare Other

## 2012-08-17 DIAGNOSIS — Z7901 Long term (current) use of anticoagulants: Secondary | ICD-10-CM

## 2012-08-17 LAB — POCT INR: INR: 2.5

## 2012-09-23 ENCOUNTER — Other Ambulatory Visit: Payer: Self-pay | Admitting: Cardiology

## 2012-09-23 ENCOUNTER — Other Ambulatory Visit: Payer: Self-pay | Admitting: Physician Assistant

## 2012-09-28 ENCOUNTER — Ambulatory Visit (INDEPENDENT_AMBULATORY_CARE_PROVIDER_SITE_OTHER): Payer: Medicare Other

## 2012-09-28 DIAGNOSIS — Z7901 Long term (current) use of anticoagulants: Secondary | ICD-10-CM

## 2012-09-28 LAB — POCT INR: INR: 2.5

## 2012-11-16 ENCOUNTER — Ambulatory Visit (INDEPENDENT_AMBULATORY_CARE_PROVIDER_SITE_OTHER): Payer: Medicare Other

## 2012-11-16 DIAGNOSIS — Z7901 Long term (current) use of anticoagulants: Secondary | ICD-10-CM

## 2012-11-16 LAB — POCT INR: INR: 2.4

## 2012-12-23 ENCOUNTER — Other Ambulatory Visit: Payer: Self-pay | Admitting: Physician Assistant

## 2012-12-23 NOTE — Telephone Encounter (Signed)
Rx request for Warfarin 

## 2012-12-28 ENCOUNTER — Ambulatory Visit (INDEPENDENT_AMBULATORY_CARE_PROVIDER_SITE_OTHER): Payer: Medicare Other

## 2012-12-28 DIAGNOSIS — Z7901 Long term (current) use of anticoagulants: Secondary | ICD-10-CM

## 2012-12-28 LAB — POCT INR: INR: 2.4

## 2013-01-07 ENCOUNTER — Emergency Department: Payer: Self-pay | Admitting: Emergency Medicine

## 2013-01-07 LAB — COMPREHENSIVE METABOLIC PANEL
Albumin: 3.8 g/dL (ref 3.4–5.0)
Alkaline Phosphatase: 115 U/L (ref 50–136)
Anion Gap: 6 — ABNORMAL LOW (ref 7–16)
BUN: 14 mg/dL (ref 7–18)
Bilirubin,Total: 0.3 mg/dL (ref 0.2–1.0)
Calcium, Total: 8.6 mg/dL (ref 8.5–10.1)
Chloride: 104 mmol/L (ref 98–107)
Co2: 27 mmol/L (ref 21–32)
Creatinine: 1.1 mg/dL (ref 0.60–1.30)
EGFR (African American): >60
EGFR (Non-African Amer.): >60
Glucose: 235 mg/dL — ABNORMAL HIGH (ref 65–99)
Osmolality: 282 (ref 275–301)
Potassium: 4.1 mmol/L (ref 3.5–5.1)
SGOT(AST): 26 U/L (ref 15–37)
SGPT (ALT): 38 U/L (ref 12–78)
Sodium: 137 mmol/L (ref 136–145)
Total Protein: 6.8 g/dL (ref 6.4–8.2)

## 2013-01-07 LAB — TROPONIN I: Troponin-I: <0.02 ng/mL

## 2013-01-07 LAB — CBC
HCT: 40 % (ref 40.0–52.0)
HGB: 13.9 g/dL (ref 13.0–18.0)
MCH: 31 pg (ref 26.0–34.0)
MCHC: 34.7 g/dL (ref 32.0–36.0)
MCV: 90 fL (ref 80–100)
Platelet: 174 10*3/uL (ref 150–440)
RBC: 4.46 10*6/uL (ref 4.40–5.90)
RDW: 13.2 % (ref 11.5–14.5)
WBC: 4.6 10*3/uL (ref 3.8–10.6)

## 2013-01-07 LAB — CK TOTAL AND CKMB (NOT AT ARMC)
CK, Total: 157 U/L (ref 35–232)
CK-MB: 1.5 ng/mL (ref 0.5–3.6)

## 2013-01-21 ENCOUNTER — Observation Stay: Payer: Self-pay | Admitting: Internal Medicine

## 2013-01-21 DIAGNOSIS — I4891 Unspecified atrial fibrillation: Secondary | ICD-10-CM

## 2013-01-21 LAB — COMPREHENSIVE METABOLIC PANEL
Albumin: 4.3 g/dL (ref 3.4–5.0)
Alkaline Phosphatase: 143 U/L — ABNORMAL HIGH (ref 50–136)
Anion Gap: 10 (ref 7–16)
BUN: 14 mg/dL (ref 7–18)
Bilirubin,Total: 0.4 mg/dL (ref 0.2–1.0)
Calcium, Total: 9.1 mg/dL (ref 8.5–10.1)
Chloride: 105 mmol/L (ref 98–107)
Co2: 23 mmol/L (ref 21–32)
Creatinine: 1.08 mg/dL (ref 0.60–1.30)
EGFR (African American): >60
EGFR (Non-African Amer.): >60
Glucose: 184 mg/dL — ABNORMAL HIGH (ref 65–99)
Osmolality: 281 (ref 275–301)
Potassium: 3.8 mmol/L (ref 3.5–5.1)
SGOT(AST): 28 U/L (ref 15–37)
SGPT (ALT): 36 U/L (ref 12–78)
Sodium: 138 mmol/L (ref 136–145)
Total Protein: 7.5 g/dL (ref 6.4–8.2)

## 2013-01-21 LAB — CBC WITH DIFFERENTIAL/PLATELET
Basophil #: 0 10*3/uL (ref 0.0–0.1)
Basophil %: 0.5 %
Eosinophil #: 0.2 10*3/uL (ref 0.0–0.7)
Eosinophil %: 3.1 %
HCT: 42.4 % (ref 40.0–52.0)
HGB: 14.4 g/dL (ref 13.0–18.0)
Lymphocyte #: 1.9 10*3/uL (ref 1.0–3.6)
Lymphocyte %: 28.1 %
MCH: 30.5 pg (ref 26.0–34.0)
MCHC: 33.8 g/dL (ref 32.0–36.0)
MCV: 90 fL (ref 80–100)
Monocyte #: 0.8 x10 3/mm (ref 0.2–1.0)
Monocyte %: 12.9 %
Neutrophil #: 3.7 10*3/uL (ref 1.4–6.5)
Neutrophil %: 55.4 %
Platelet: 186 10*3/uL (ref 150–440)
RBC: 4.71 10*6/uL (ref 4.40–5.90)
RDW: 13.8 % (ref 11.5–14.5)
WBC: 6.6 10*3/uL (ref 3.8–10.6)

## 2013-01-21 LAB — URINALYSIS, COMPLETE
Bacteria: NONE SEEN
Bilirubin,UR: NEGATIVE
Blood: NEGATIVE
Glucose,UR: >=500 mg/dL (ref 0–75)
Leukocyte Esterase: NEGATIVE
Nitrite: NEGATIVE
Ph: 6 (ref 4.5–8.0)
Protein: NEGATIVE
RBC,UR: 1 /HPF (ref 0–5)
Specific Gravity: 1.005 (ref 1.003–1.030)
Squamous Epithelial: NONE SEEN
WBC UR: 1 /HPF (ref 0–5)

## 2013-01-21 LAB — CK TOTAL AND CKMB (NOT AT ARMC)
CK, Total: 208 U/L (ref 35–232)
CK, Total: 129 U/L (ref 35–232)
CK, Total: 171 U/L (ref 35–232)
CK-MB: 3 ng/mL (ref 0.5–3.6)
CK-MB: 2.9 ng/mL (ref 0.5–3.6)
CK-MB: 2.7 ng/mL (ref 0.5–3.6)

## 2013-01-21 LAB — PROTIME-INR
INR: 1.9
Prothrombin Time: 21.3 secs — ABNORMAL HIGH (ref 11.5–14.7)

## 2013-01-21 LAB — TROPONIN I
Troponin-I: <0.02 ng/mL
Troponin-I: 0.15 ng/mL — ABNORMAL HIGH
Troponin-I: 0.12 ng/mL — ABNORMAL HIGH

## 2013-01-22 LAB — CBC WITH DIFFERENTIAL/PLATELET
Basophil #: 0 10*3/uL (ref 0.0–0.1)
Basophil %: 0.5 %
Eosinophil #: 0.2 10*3/uL (ref 0.0–0.7)
Eosinophil %: 4.7 %
HCT: 38.2 % — ABNORMAL LOW (ref 40.0–52.0)
HGB: 13.2 g/dL (ref 13.0–18.0)
Lymphocyte #: 1.7 10*3/uL (ref 1.0–3.6)
Lymphocyte %: 32.2 %
MCH: 30.9 pg (ref 26.0–34.0)
MCHC: 34.4 g/dL (ref 32.0–36.0)
MCV: 90 fL (ref 80–100)
Monocyte #: 0.8 x10 3/mm (ref 0.2–1.0)
Monocyte %: 14.3 %
Neutrophil #: 2.5 10*3/uL (ref 1.4–6.5)
Neutrophil %: 48.3 %
Platelet: 173 10*3/uL (ref 150–440)
RBC: 4.25 10*6/uL — ABNORMAL LOW (ref 4.40–5.90)
RDW: 13.5 % (ref 11.5–14.5)
WBC: 5.3 10*3/uL (ref 3.8–10.6)

## 2013-01-22 LAB — PROTIME-INR
INR: 2.5
Prothrombin Time: 26.2 secs — ABNORMAL HIGH (ref 11.5–14.7)

## 2013-01-22 LAB — BASIC METABOLIC PANEL
Anion Gap: 5 — ABNORMAL LOW (ref 7–16)
BUN: 16 mg/dL (ref 7–18)
Calcium, Total: 8.6 mg/dL (ref 8.5–10.1)
Chloride: 106 mmol/L (ref 98–107)
Co2: 28 mmol/L (ref 21–32)
Creatinine: 1.02 mg/dL (ref 0.60–1.30)
EGFR (African American): >60
EGFR (Non-African Amer.): >60
Glucose: 158 mg/dL — ABNORMAL HIGH (ref 65–99)
Osmolality: 282 (ref 275–301)
Potassium: 4.2 mmol/L (ref 3.5–5.1)
Sodium: 139 mmol/L (ref 136–145)

## 2013-01-24 ENCOUNTER — Telehealth: Payer: Self-pay

## 2013-01-24 NOTE — Telephone Encounter (Signed)
Patient contacted regarding discharge from [facility] on 01/22/13.  Lmom

## 2013-01-24 NOTE — Telephone Encounter (Signed)
Patient contacted regarding discharge from [facility] on 01/22/13.  Patient understands to follow up with provider Dr. Kirke Corin on Friday, Sept 12 at 3:45 at [facility] office. Patient understands discharge instructions? yes Patient understands medications and regiment? yes Patient understands to bring all medications to this visit? yes

## 2013-01-27 ENCOUNTER — Telehealth: Payer: Self-pay

## 2013-01-27 MED ORDER — FUROSEMIDE 20 MG PO TABS: ORAL_TABLET | ORAL | Status: DC

## 2013-01-27 NOTE — Telephone Encounter (Signed)
Pt advised,verbalized understanding. 

## 2013-01-27 NOTE — Telephone Encounter (Signed)
Take Lasix 20 mg once daily for 3-4 days then use as needed for edema.

## 2013-01-27 NOTE — Telephone Encounter (Signed)
Spoke with patient. Pt states he was hospitalized recently. He was prescribed a new medication,he thinks is Multaq, on  discharge from the hospital. He states he had swelling in his feet and ankles during hospitalization. He still has some swelling in his feet and ankles but  not as bad as when he was in the hospital. He denies SOB or other symptoms. He does take amlodipine. He states this is not a new medication. He is asking if he should take a fluid pill. He is not taking lasix currently. I will forward to Dr Kirke Corin for review

## 2013-01-27 NOTE — Telephone Encounter (Signed)
Pt states he was in the hospital this past weekend with afib, states dr Kirke Corin put him on new med, states feet and ankles are swelling. Wants to know if this is from the new med, or his current med.

## 2013-02-03 ENCOUNTER — Ambulatory Visit (INDEPENDENT_AMBULATORY_CARE_PROVIDER_SITE_OTHER): Payer: Medicare Other | Admitting: Cardiovascular Disease

## 2013-02-03 ENCOUNTER — Ambulatory Visit (INDEPENDENT_AMBULATORY_CARE_PROVIDER_SITE_OTHER): Payer: Medicare Other

## 2013-02-03 ENCOUNTER — Encounter: Payer: Self-pay | Admitting: Cardiovascular Disease

## 2013-02-03 VITALS — BP 140/60 | HR 46 | Ht 68.0 in | Wt 196.2 lb

## 2013-02-03 DIAGNOSIS — I359 Nonrheumatic aortic valve disorder, unspecified: Secondary | ICD-10-CM

## 2013-02-03 DIAGNOSIS — I4891 Unspecified atrial fibrillation: Secondary | ICD-10-CM

## 2013-02-03 DIAGNOSIS — Z7901 Long term (current) use of anticoagulants: Secondary | ICD-10-CM

## 2013-02-03 DIAGNOSIS — I25810 Atherosclerosis of coronary artery bypass graft(s) without angina pectoris: Secondary | ICD-10-CM

## 2013-02-03 DIAGNOSIS — I350 Nonrheumatic aortic (valve) stenosis: Secondary | ICD-10-CM

## 2013-02-03 LAB — POCT INR: INR: 2.5

## 2013-02-03 MED ORDER — DRONEDARONE HCL 400 MG PO TABS: 400.0000 mg | ORAL_TABLET | Freq: Two times a day (BID) | ORAL | Status: DC

## 2013-02-03 MED ORDER — METOPROLOL TARTRATE 25 MG PO TABS: 12.5000 mg | ORAL_TABLET | Freq: Two times a day (BID) | ORAL | Status: DC

## 2013-02-03 NOTE — Patient Instructions (Addendum)
Continue same medications.  Use knee high support stockings during the day.  Follow up in 3 months.

## 2013-02-03 NOTE — Assessment & Plan Note (Signed)
The patient is tolerating treatment with Multaq without any reported side effects. No recurrent episodes since then. He is bradycardic but overall is asymptomatic. Continue small dose metoprolol 12.5 mg twice daily. Continue long-term anticoagulation with warfarin. If he develops recurrent atrial fibrillation, a different antiarrhythmic medication or ablation can be considered.

## 2013-02-03 NOTE — Assessment & Plan Note (Signed)
Continue monitoring with echocardiogram every 2-3 years or sooner for worsening symptoms.

## 2013-02-03 NOTE — Assessment & Plan Note (Signed)
He has no symptoms of angina. Continue medical therapy. He does have recurrent lower extremity edema which is likely due to chronic venous insufficiency and treatment with amlodipine. I advised him to use knee-high support stockings.

## 2013-02-03 NOTE — Progress Notes (Signed)
HPI  Seth Hardy is a 67 -year-old man who is here today for a followup visit. He has known history of coronary artery disease status post CABG,  history of a non-STEMI in the setting of paroxysmal atrial fibrillation with a rapid ventricular rate in October of 2013, status post bare-metal stent to right coronary artery, anticoagulation therapy, hypertension, hyperlipidemia, type 2 diabetes, bilateral carotid disease, and mild aortic stenosis. He is here today for a followup visit after recent hospitalization at [facility] for atrial fibrillation with rapid ventricular response. Cardiac enzymes were borderline elevated. Given his recurrent episodes of symptomatic atrial fibrillation, I felt that an antiarrhythmic medication was needed. I started him on Multaq twice daily. He reports noting palpitations or tachycardia since then. The dose of metoprolol was decreased to 12.5 mg twice daily due to his baseline resting bradycardia.   He has known history of lower extremity edema. He reported increased edema since hospital discharge. He was instructed to use Lasix 20 mg as needed. He denies any chest pain or dyspnea. No dizziness, syncope or presyncope.     No Known Allergies   Current Outpatient Prescriptions on File Prior to Visit  Medication Sig Dispense Refill  . acetaminophen (TYLENOL) 325 MG tablet Take 650 mg by mouth every 6 (six) hours as needed. For pain      . amLODipine (NORVASC) 5 MG tablet Take 1 tablet (5 mg total) by mouth daily.  90 tablet  3  . aspirin 81 MG EC tablet Take 81 mg by mouth daily.        Marland Kitchen atorvastatin (LIPITOR) 20 MG tablet TAKE ONE TABLET BY MOUTH   NIGHTLY AT BEDTIME  30 tablet  5  . furosemide (LASIX) 20 MG tablet 1 tablet daily for 3-4 days then 1 tablet daily as needed  20 tablet  0  . glimepiride (AMARYL) 4 MG tablet Take 4 mg by mouth 2 (two) times daily.        . insulin glargine (LANTUS) 100 UNIT/ML injection Inject 20 Units into the skin daily.      . insulin  lispro (HUMALOG) 100 UNIT/ML injection Inject 8 Units into the skin 2 (two) times daily with a meal.      . metFORMIN (GLUCOPHAGE) 500 MG tablet Take 500 mg by mouth 2 (two) times daily with a meal.      . nitroGLYCERIN (NITROSTAT) 0.4 MG SL tablet Place 1 tablet (0.4 mg total) under the tongue every 5 (five) minutes as needed (up to 3 doses).      . Omega-3 Fatty Acids (FISH OIL) 1200 MG CAPS Take 1 capsule by mouth daily.        . pantoprazole (PROTONIX) 40 MG tablet TAKE ONE TABLET BY MOUTH ONE TIME DAILY  30 tablet  11  . vitamin C (ASCORBIC ACID) 500 MG tablet Take 500 mg by mouth daily.        Marland Kitchen warfarin (COUMADIN) 5 MG tablet Take one tablet by mouth every day as directed  30 tablet  5   No current facility-administered medications on file prior to visit.     Past Medical History  Diagnosis Date  . HTN (hypertension)   . Hyperlipidemia   . Diabetes mellitus type II, controlled     a. Variable CBG 02/2012 - several meds adjusted.  . Mild aortic stenosis     a. 03/2011 Echo: EF 55-60%, Mild AS. b. Mild by cath 02/2012.  . Carotid disease, bilateral  a. 03/2011 U/S: 40-59% bilat Carotid dzs  . Obesity   . GERD (gastroesophageal reflux disease)   . PAF (paroxysmal atrial fibrillation)     a. Newly dx 02/2012 & initiated on Coumadin (spont converted to NSR).  Marland Kitchen CAD (coronary artery disease)     a. 01/2010 CABG x 4: LIMA->LAD, VG->D1, VG->OM1, VG->PDA. b. NSTEMI 02/2012 in setting of AF-RVR with cath s/p BMS to RCA (plan for 1 month, possibly up to 3 months of Plavix)     Past Surgical History  Procedure Laterality Date  . Cyst l kidney    . Angioplasty    . Coronary artery bypass graft    . Cardiac catheterization    . Eye surgery    . Hernia repair       Family History  Problem Relation Age of Onset  . COPD Mother     alive 32  . Other Father     alive 77     History   Social History  . Marital Status: Married    Spouse Name: N/A    Number of Children: N/A   . Years of Education: N/A   Occupational History  . Not on file.   Social History Main Topics  . Smoking status: Never Smoker   . Smokeless tobacco: Not on file     Comment: tobacco use - no  . Alcohol Use: Yes     Comment: rare drink  . Drug Use: No  . Sexual Activity: Yes   Other Topics Concern  . Not on file   Social History Narrative   Full time. Gets regular exercise. Lives in [place] with his wife.  Retired HS football/basketball official.      PHYSICAL EXAM   BP 140/60  Pulse 46  Ht 5\' 8"  (1.727 m)  Wt 196 lb 4 oz (89.018 kg)  BMI 29.85 kg/m2 Constitutional: He is oriented to person, place, and time. He appears well-developed and well-nourished. No distress.  HENT: No nasal discharge.  Head: Normocephalic and atraumatic.  Eyes: Pupils are equal and round. Right eye exhibits no discharge. Left eye exhibits no discharge.  Neck: Normal range of motion. Neck supple. No JVD present. No thyromegaly present.  Cardiovascular: Bradycardic, regular rhythm, normal heart sounds and. Exam reveals no gallop and no friction rub. No murmur heard.  There is a 2/6 systolic ejection murmur at the aortic area which is early peaking Pulmonary/Chest: Effort normal and breath sounds normal. No stridor. No respiratory distress. He has no wheezes. He has no rales. He exhibits no tenderness.  Abdominal: Soft. Bowel sounds are normal. He exhibits no distension. There is no tenderness. There is no rebound and no guarding.  Musculoskeletal: Normal range of motion. He exhibits trace edema and no tenderness.  Neurological: He is alert and oriented to person, place, and time. Coordination normal.  Skin: Skin is warm and dry. No rash noted. He is not diaphoretic. No erythema. No pallor.  Psychiatric: He has a normal mood and affect. His behavior is normal. Judgment and thought content normal.       WJX:[id number] bradycardia with a heart rate of 46 beats per minute, left ventricular hypertrophy with  repolarization abnormalities.   ASSESSMENT AND PLAN

## 2013-03-01 ENCOUNTER — Other Ambulatory Visit: Payer: Self-pay | Admitting: Cardiology

## 2013-03-01 ENCOUNTER — Other Ambulatory Visit: Payer: Self-pay | Admitting: *Deleted

## 2013-03-01 MED ORDER — RAMIPRIL 10 MG PO CAPS: 10.0000 mg | ORAL_CAPSULE | Freq: Every day | ORAL | Status: DC

## 2013-03-01 NOTE — Telephone Encounter (Signed)
Refilled Ramipril sent to [facility] pharmacy #[phone] #[phone].

## 2013-03-23 ENCOUNTER — Other Ambulatory Visit: Payer: Self-pay | Admitting: Cardiology

## 2013-03-30 ENCOUNTER — Other Ambulatory Visit: Payer: Self-pay

## 2013-04-05 ENCOUNTER — Ambulatory Visit (INDEPENDENT_AMBULATORY_CARE_PROVIDER_SITE_OTHER): Payer: Medicare Other | Admitting: *Deleted

## 2013-04-05 ENCOUNTER — Telehealth: Payer: Self-pay

## 2013-04-05 ENCOUNTER — Telehealth: Payer: Self-pay | Admitting: *Deleted

## 2013-04-05 DIAGNOSIS — I4891 Unspecified atrial fibrillation: Secondary | ICD-10-CM

## 2013-04-05 DIAGNOSIS — Z7901 Long term (current) use of anticoagulants: Secondary | ICD-10-CM

## 2013-04-05 LAB — POCT INR: INR: 3.3

## 2013-04-05 NOTE — Telephone Encounter (Signed)
Pt came in this morning requesting help with his Multaq, as it is too expensive.  Gave pt discount card to take to pharmacy, but he reports that because he has medicare, it will not work for him. Pt would like to know if Dr. Kirke Corin will prescribe him something more affordable. Please advise.  Thank you!

## 2013-04-05 NOTE — Telephone Encounter (Signed)
Patient called has a question on Multac please call.

## 2013-04-05 NOTE — Telephone Encounter (Signed)
Pt given Multaq discount card. To call back if card does not work and/or he would like to switch to something more affordable.

## 2013-04-06 NOTE — Telephone Encounter (Signed)
He has the following options: 1. Continue Multaq given that this medication worked.  2. Switch Multaq to Amiodarone which is cheaper but has more chance of longterm side effects affecting the lungs, thyroid and liver.  3. Refer to Dr. Johney Frame to consider A-fib ablation.   Please let me know what he decides.

## 2013-04-07 NOTE — Telephone Encounter (Signed)
Spoke w/ pt.  He was recently diagnosed with sleep apnea by his PCP and is awaiting his CPAP machine. He reports that he is hoping that his tachycardia will be resolved once his OSA is addressed.  He has enough Multaq to last until the end of the year and would like to continue this until next appt w/ Dr. Kirke Corin on 12/15. He is to call back with any questions or concerns.

## 2013-04-18 ENCOUNTER — Telehealth: Payer: Self-pay

## 2013-04-18 NOTE — Telephone Encounter (Signed)
Pt wife called and states pt is going to "[place] stop his medicine, Multaq". States pt is in the donut hole and doesn't want to pay to get this medication. Pt wife is concerned he shouldn't stop this medication. States this medication is working. Please call.

## 2013-04-18 NOTE — Telephone Encounter (Signed)
Provide him with enough samples to cover him until he comes to see me in December. He should check with his insurance about what alternatives do they cover. Do they cover Dofetilide or Amiodarone?

## 2013-04-18 NOTE — Telephone Encounter (Signed)
Left message for multaq rep to call back asap.

## 2013-04-19 NOTE — Telephone Encounter (Signed)
Spoke w/ pt.  He would like to start amiodarone and talk w/ Dr. Kirke Corin about pros and cons at his next visit. Reports that he was told that OSA was the biggest culprit in his afib and he is hoping that once "that is treated, my afib will go away, so I only need the meds for a little while." Pt has f/u w/ Dr. Kirke Corin 05/08/13.

## 2013-04-19 NOTE — Telephone Encounter (Signed)
Kip, multaq rep called back, stating he is in [place] visiting his family for Thanksgiving and will be in our office next Tuesday. Spoke w/ pt's wife and let her know.  She reports that pt has enough multaq to last thru Friday of this week. She will call her ins to see if they cover dofetilide or amiodarone. She is to call back and let me know so we can send in rx.

## 2013-04-19 NOTE — Telephone Encounter (Signed)
I am fine switching him to Amiodarone but he has to understand that Amiodarone has much more long term side effects than Multaq and Dofetilide.  IF that's what her prefers, then stop Multaq and start Amiodarone 200 mg BID until his follow up visit. We can decrease it to 200 mg once daily at that time.

## 2013-04-19 NOTE — Telephone Encounter (Signed)
Pt called stating that ins will cover amiodarone.   He is interested in possibly being prescribed 200mg  and would like to save money by cutting these in 1/2 if possible.  Please advise.   Thank you!

## 2013-04-21 ENCOUNTER — Telehealth: Payer: Self-pay | Admitting: Cardiovascular Disease

## 2013-04-21 NOTE — Telephone Encounter (Signed)
Follow up    Interaction between to med, Amiodarone & Warfarin.  Pharmacy questions about this.  Is Dr aware?  Please give them a call back.

## 2013-04-21 NOTE — Telephone Encounter (Signed)
Called and informed pharmacy staff at [facility] that our office is aware of the interaction with warfarin and amiodarone and according to last conversation with patient, he is going to wait on starting amiodarone until after he discusses this with Dr. Kirke Corin. Osborne Casco informed and will profile prescription for patient.

## 2013-04-23 NOTE — Telephone Encounter (Signed)
Has he been started on  Amiodarone? I don't see an prescription sent? If yes, then we need to update his medication list and check INR 3 days after starting Amiodarone due to interaction with warfarin.

## 2013-04-24 ENCOUNTER — Telehealth: Payer: Self-pay

## 2013-04-24 MED ORDER — AMIODARONE HCL 200 MG PO TABS: 200.0000 mg | ORAL_TABLET | Freq: Every day | ORAL | Status: DC

## 2013-04-24 NOTE — Telephone Encounter (Signed)
Pt has question regarding his medication Multaq. Request a call back ASAP

## 2013-04-24 NOTE — Addendum Note (Signed)
Addended by: Rhea Belton R on: 04/24/2013 08:19 AM   Modules accepted: Orders

## 2013-04-24 NOTE — Telephone Encounter (Signed)
Pt called to ask if we had any multaq samples. We do not. Pt instructed to call back tomorrow or Wednesday.

## 2013-04-24 NOTE — Telephone Encounter (Signed)
It was after hours when I called in his rx. Called Amiodarone 200 mg BID #60 w/ 4 refills to [facility]. Valorie Roosevelt in med list.

## 2013-04-25 ENCOUNTER — Telehealth: Payer: Self-pay

## 2013-04-25 NOTE — Telephone Encounter (Signed)
Spoke w/ pt.  Informed him that I am leaving samples of Multaq up front in a bag with his name on it.

## 2013-04-27 ENCOUNTER — Telehealth: Payer: Self-pay

## 2013-04-27 ENCOUNTER — Telehealth: Payer: Self-pay | Admitting: *Deleted

## 2013-04-27 NOTE — Telephone Encounter (Signed)
Pt called and said he was afraid to take the amiodarone after reading the side effects. He said he would take it if Dr. Kirke Corin really thought he needed to but would like to discuss it further first. He stated that he was still taking a half dose of multaq even though Dr. Kirke Corin advised him to take the entire dose until they could discuss it further.

## 2013-04-27 NOTE — Telephone Encounter (Signed)
Pt has a question regarding Multaq. Please call

## 2013-04-28 ENCOUNTER — Telehealth: Payer: Self-pay | Admitting: *Deleted

## 2013-04-28 NOTE — Telephone Encounter (Signed)
We now have samples of Multaq. He should stay on Multaq until he comes to see me. Do not take Amiodarone.

## 2013-04-28 NOTE — Telephone Encounter (Signed)
Informed pt that Dr. Kirke Corin wants him to continue his multaq until his next visit and not to take amiodarone. Patient verbalized understanding.

## 2013-05-03 ENCOUNTER — Encounter (INDEPENDENT_AMBULATORY_CARE_PROVIDER_SITE_OTHER): Payer: Medicare Other

## 2013-05-03 DIAGNOSIS — Z7901 Long term (current) use of anticoagulants: Secondary | ICD-10-CM

## 2013-05-03 DIAGNOSIS — I4891 Unspecified atrial fibrillation: Secondary | ICD-10-CM

## 2013-05-08 ENCOUNTER — Ambulatory Visit: Payer: Medicare Other | Admitting: Cardiovascular Disease

## 2013-05-11 ENCOUNTER — Ambulatory Visit (INDEPENDENT_AMBULATORY_CARE_PROVIDER_SITE_OTHER): Payer: Medicare Other | Admitting: Cardiovascular Disease

## 2013-05-11 ENCOUNTER — Encounter: Payer: Self-pay | Admitting: Cardiovascular Disease

## 2013-05-11 ENCOUNTER — Ambulatory Visit (INDEPENDENT_AMBULATORY_CARE_PROVIDER_SITE_OTHER): Payer: Medicare Other | Admitting: Pharmacist

## 2013-05-11 VITALS — BP 155/64 | HR 45 | Ht 68.0 in | Wt 196.5 lb

## 2013-05-11 DIAGNOSIS — I6529 Occlusion and stenosis of unspecified carotid artery: Secondary | ICD-10-CM

## 2013-05-11 DIAGNOSIS — I6523 Occlusion and stenosis of bilateral carotid arteries: Secondary | ICD-10-CM

## 2013-05-11 DIAGNOSIS — I2510 Atherosclerotic heart disease of native coronary artery without angina pectoris: Secondary | ICD-10-CM

## 2013-05-11 DIAGNOSIS — Z7901 Long term (current) use of anticoagulants: Secondary | ICD-10-CM

## 2013-05-11 DIAGNOSIS — I359 Nonrheumatic aortic valve disorder, unspecified: Secondary | ICD-10-CM

## 2013-05-11 DIAGNOSIS — I350 Nonrheumatic aortic (valve) stenosis: Secondary | ICD-10-CM

## 2013-05-11 DIAGNOSIS — I4891 Unspecified atrial fibrillation: Secondary | ICD-10-CM

## 2013-05-11 DIAGNOSIS — I658 Occlusion and stenosis of other precerebral arteries: Secondary | ICD-10-CM

## 2013-05-11 LAB — POCT INR: INR: 2.6

## 2013-05-11 NOTE — Assessment & Plan Note (Signed)
He has moderate bilateral ICA stenosis (40-59%). Recommend a followup carotid duplex.   

## 2013-05-11 NOTE — Assessment & Plan Note (Signed)
Most recent echocardiogram was in November of 2013 which showed mild stenosis. Recommend a followup study in 2015.

## 2013-05-11 NOTE — Assessment & Plan Note (Signed)
He has no symptoms of angina. Continue medical therapy. 

## 2013-05-11 NOTE — Assessment & Plan Note (Signed)
The patient is doing well on Multaq with no recurrent episodes of atrial fibrillation. However, he is having hard time affording this medication. I discussed different management options with including switching to a different antiarrhythmic medication. Dofetilide is as expensive. Sotalol is not a good option due to significant bradycardia. Other antiarrhythmic medications cannot be used due to coronary artery disease. The only option left is on amiodarone. After discussing the potential side effects with this, the patient prefers not to go on it.  He was diagnosed with sleep apnea and was started on CPAP which might improve the burden of atrial fibrillation. We might decide to take him off antiarrhythmic medications altogether in the near future or placing him on a small dose amiodarone if he agrees. For now, continue treatment with Multaq.

## 2013-05-11 NOTE — Patient Instructions (Signed)
Your physician has requested that you have a carotid duplex. This test is an ultrasound of the carotid arteries in your neck. It looks at blood flow through these arteries that supply the brain with blood. Allow one hour for this exam. There are no restrictions or special instructions.  Your physician recommends that you continue on your current medications as directed. Please refer to the Current Medication list given to you today.  Your physician recommends that you schedule a follow-up appointment in: 3 months  

## 2013-05-11 NOTE — Progress Notes (Signed)
HPI  [name] is a 67 -year-old man who is here today for a followup visit. He has known history of coronary artery disease status post CABG,  history of a non-STEMI in the setting of paroxysmal atrial fibrillation with a rapid ventricular rate in October of 2013, status post bare-metal stent to right coronary artery, anticoagulation therapy, hypertension, hyperlipidemia, type 2 diabetes, bilateral carotid disease, and mild aortic stenosis.   He was placed on Multaq due to recurrent symptomatic episodes of atrial fibrillation causing coronary ischemia. He had no recurrent episodes of A. fib or palpitations since she was started on this medication. However, he is having hard time affording this medication as it makes him go into the "donut hole"after about 5 months. This makes it hard for him to afford other medications. Otherwise he has been doing well with no chest pain or significant dyspnea.  No Known Allergies   Current Outpatient Prescriptions on File Prior to Visit  Medication Sig Dispense Refill  . acetaminophen (TYLENOL) 325 MG tablet Take 650 mg by mouth every 6 (six) hours as needed. For pain      . amLODipine (NORVASC) 5 MG tablet Take 1 tablet (5 mg total) by mouth daily.  90 tablet  3  . aspirin 81 MG EC tablet Take 81 mg by mouth daily.        Marland Kitchen atorvastatin (LIPITOR) 20 MG tablet TAKE ONE TABLET BY MOUTH NIGHTLY AT BEDTIME   30 tablet  2  . dronedarone (MULTAQ) 400 MG tablet Take 1 tablet (400 mg total) by mouth 2 (two) times daily with a meal.  60 tablet  6  . glimepiride (AMARYL) 4 MG tablet Take 4 mg by mouth 2 (two) times daily.        . insulin glargine (LANTUS) 100 UNIT/ML injection Inject 20 Units into the skin daily.      . insulin lispro (HUMALOG) 100 UNIT/ML injection Inject 8 Units into the skin 2 (two) times daily with a meal.      . metFORMIN (GLUCOPHAGE) 500 MG tablet Take 500 mg by mouth 2 (two) times daily with a meal.      . metoprolol tartrate (LOPRESSOR)  25 MG tablet Take 0.5 tablets (12.5 mg total) by mouth 2 (two) times daily.  60 tablet  11  . nitroGLYCERIN (NITROSTAT) 0.4 MG SL tablet Place 1 tablet (0.4 mg total) under the tongue every 5 (five) minutes as needed (up to 3 doses).      . Omega-3 Fatty Acids (FISH OIL) 1200 MG CAPS Take 1 capsule by mouth daily.        . pantoprazole (PROTONIX) 40 MG tablet TAKE ONE TABLET BY MOUTH ONE TIME DAILY  30 tablet  11  . ramipril (ALTACE) 10 MG capsule Take one capsule by mouth one time daily  30 capsule  10  . ramipril (ALTACE) 10 MG capsule Take 1 capsule (10 mg total) by mouth daily.  30 capsule  5  . vitamin C (ASCORBIC ACID) 500 MG tablet Take 500 mg by mouth daily.        Marland Kitchen warfarin (COUMADIN) 5 MG tablet Take one tablet by mouth every day as directed  30 tablet  5   No current facility-administered medications on file prior to visit.     Past Medical History  Diagnosis Date  . HTN (hypertension)   . Hyperlipidemia   . Diabetes mellitus type II, controlled     a. Variable CBG 02/2012 -  several meds adjusted.  . Mild aortic stenosis     a. 03/2011 Echo: EF 55-60%, Mild AS. b. Mild by cath 02/2012.  . Carotid disease, bilateral     a. 03/2011 U/S: 40-59% bilat Carotid dzs  . Obesity   . GERD (gastroesophageal reflux disease)   . PAF (paroxysmal atrial fibrillation)     a. Newly dx 02/2012 & initiated on Coumadin (spont converted to NSR).  Marland Kitchen CAD (coronary artery disease)     a. 01/2010 CABG x 4: LIMA->LAD, VG->D1, VG->OM1, VG->PDA. b. NSTEMI 02/2012 in setting of AF-RVR with cath s/p BMS to RCA (plan for 1 month, possibly up to 3 months of Plavix)  . Sleep apnea      Past Surgical History  Procedure Laterality Date  . Cyst l kidney    . Angioplasty    . Coronary artery bypass graft    . Cardiac catheterization    . Eye surgery    . Hernia repair       Family History  Problem Relation Age of Onset  . COPD Mother     alive 19  . Other Father     alive 86     History    Social History  . Marital Status: Married    Spouse Name: N/A    Number of Children: N/A  . Years of Education: N/A   Occupational History  . Not on file.   Social History Main Topics  . Smoking status: Never Smoker   . Smokeless tobacco: Not on file     Comment: tobacco use - no  . Alcohol Use: Yes     Comment: rare drink  . Drug Use: No  . Sexual Activity: Yes   Other Topics Concern  . Not on file   Social History Narrative   Full time. Gets regular exercise. Lives in [place] with his wife.  Retired HS football/basketball official.      PHYSICAL EXAM   BP 155/64  Pulse 45  Ht 5\' 8"  (1.727 m)  Wt 196 lb 8 oz (89.132 kg)  BMI 29.88 kg/m2 Constitutional: He is oriented to person, place, and time. He appears well-developed and well-nourished. No distress.  HENT: No nasal discharge.  Head: Normocephalic and atraumatic.  Eyes: Pupils are equal and round. Right eye exhibits no discharge. Left eye exhibits no discharge.  Neck: Normal range of motion. Neck supple. No JVD present. No thyromegaly present.  Cardiovascular: Bradycardic, regular rhythm, normal heart sounds and. Exam reveals no gallop and no friction rub. No murmur heard.  There is a 2/6 systolic ejection murmur at the aortic area which is early peaking Pulmonary/Chest: Effort normal and breath sounds normal. No stridor. No respiratory distress. He has no wheezes. He has no rales. He exhibits no tenderness.  Abdominal: Soft. Bowel sounds are normal. He exhibits no distension. There is no tenderness. There is no rebound and no guarding.  Musculoskeletal: Normal range of motion. He exhibits trace edema and no tenderness.  Neurological: He is alert and oriented to person, place, and time. Coordination normal.  Skin: Skin is warm and dry. No rash noted. He is not diaphoretic. No erythema. No pallor.  Psychiatric: He has a normal mood and affect. His behavior is normal. Judgment and thought content normal.        WUJ:[id number] bradycardia with a heart rate of 46 beats per minute, left ventricular hypertrophy with repolarization abnormalities.   ASSESSMENT AND PLAN

## 2013-05-30 ENCOUNTER — Encounter (INDEPENDENT_AMBULATORY_CARE_PROVIDER_SITE_OTHER): Payer: Medicare Other

## 2013-05-30 DIAGNOSIS — I6529 Occlusion and stenosis of unspecified carotid artery: Secondary | ICD-10-CM

## 2013-05-30 DIAGNOSIS — I6523 Occlusion and stenosis of bilateral carotid arteries: Secondary | ICD-10-CM

## 2013-06-04 ENCOUNTER — Other Ambulatory Visit: Payer: Self-pay | Admitting: Physician Assistant

## 2013-06-13 ENCOUNTER — Telehealth: Payer: Self-pay | Admitting: *Deleted

## 2013-06-13 NOTE — Telephone Encounter (Signed)
Patient is out of multaq and can not afford to fill it. Patient informed that our office is out and we have contacted the rep and they are on back order.  He also has decided not to take the amiodarone because he is afraid of the side effects. It was ordered at his last visit and he never filled it.

## 2013-06-13 NOTE — Telephone Encounter (Signed)
Patient has some question regarding prescription Multaq 400mg 

## 2013-06-14 NOTE — Telephone Encounter (Signed)
Called patient to inform him multaq samples came in today and his sample is at the front desk for pick up today .

## 2013-06-28 ENCOUNTER — Ambulatory Visit (INDEPENDENT_AMBULATORY_CARE_PROVIDER_SITE_OTHER): Payer: Medicare Other | Admitting: *Deleted

## 2013-06-28 DIAGNOSIS — Z7901 Long term (current) use of anticoagulants: Secondary | ICD-10-CM

## 2013-06-28 DIAGNOSIS — I4891 Unspecified atrial fibrillation: Secondary | ICD-10-CM

## 2013-06-28 LAB — POCT INR: INR: 2.3

## 2013-06-30 ENCOUNTER — Other Ambulatory Visit: Payer: Self-pay | Admitting: Cardiology

## 2013-07-02 ENCOUNTER — Other Ambulatory Visit: Payer: Self-pay | Admitting: Cardiology

## 2013-07-02 ENCOUNTER — Other Ambulatory Visit: Payer: Self-pay | Admitting: Cardiovascular Disease

## 2013-07-07 ENCOUNTER — Other Ambulatory Visit: Payer: Self-pay | Admitting: Cardiology

## 2013-08-14 ENCOUNTER — Ambulatory Visit (INDEPENDENT_AMBULATORY_CARE_PROVIDER_SITE_OTHER): Payer: Medicare Other | Admitting: Cardiovascular Disease

## 2013-08-14 ENCOUNTER — Encounter: Payer: Self-pay | Admitting: Cardiovascular Disease

## 2013-08-14 VITALS — BP 158/76 | HR 41 | Ht 68.0 in | Wt 195.5 lb

## 2013-08-14 DIAGNOSIS — I350 Nonrheumatic aortic (valve) stenosis: Secondary | ICD-10-CM

## 2013-08-14 DIAGNOSIS — I359 Nonrheumatic aortic valve disorder, unspecified: Secondary | ICD-10-CM

## 2013-08-14 DIAGNOSIS — I25810 Atherosclerosis of coronary artery bypass graft(s) without angina pectoris: Secondary | ICD-10-CM

## 2013-08-14 DIAGNOSIS — I4891 Unspecified atrial fibrillation: Secondary | ICD-10-CM

## 2013-08-14 DIAGNOSIS — Z7901 Long term (current) use of anticoagulants: Secondary | ICD-10-CM

## 2013-08-14 DIAGNOSIS — I6529 Occlusion and stenosis of unspecified carotid artery: Secondary | ICD-10-CM

## 2013-08-14 LAB — POCT INR: INR: 2.1

## 2013-08-14 NOTE — Assessment & Plan Note (Signed)
Most recent echocardiogram was in November of 2013 which showed mild stenosis. Recommend a followup study in late 2015.

## 2013-08-14 NOTE — Progress Notes (Signed)
HPI  [name] is a 68 -year-old man who is here today for a followup visit. He has known history of coronary artery disease status post CABG,  history of a non-STEMI in the setting of paroxysmal atrial fibrillation with a rapid ventricular rate in October of 2013, status post bare-metal stent to right coronary artery, anticoagulation therapy, hypertension, hyperlipidemia, type 2 diabetes, bilateral moderate carotid disease, and mild aortic stenosis.   He was placed on Multaq due to recurrent symptomatic episodes of atrial fibrillation causing coronary ischemia. He had no recurrent episodes of A. fib or palpitations since she was started on this medication. However, he is having hard time affording this medication as it makes him go into the "donut hole"after about 5 months.  Otherwise he has been doing well with no chest pain or significant dyspnea. He has been bradycardic but he denies dizziness, syncope or presyncope.  No Known Allergies   Current Outpatient Prescriptions on File Prior to Visit  Medication Sig Dispense Refill  . acetaminophen (TYLENOL) 325 MG tablet Take 650 mg by mouth every 6 (six) hours as needed. For pain      . amLODipine (NORVASC) 5 MG tablet Take one tablet by mouth one time daily  90 tablet  3  . aspirin 81 MG EC tablet Take 81 mg by mouth daily.        Marland Kitchen atorvastatin (LIPITOR) 20 MG tablet TAKE ONE TABLET BY MOUTH NIGHTLY AT BEDTIME   30 tablet  1  . dronedarone (MULTAQ) 400 MG tablet Take 1 tablet (400 mg total) by mouth 2 (two) times daily with a meal.  60 tablet  6  . furosemide (LASIX) 20 MG tablet Take 20 mg by mouth as needed.      Marland Kitchen glimepiride (AMARYL) 4 MG tablet Take 4 mg by mouth 2 (two) times daily.        . insulin glargine (LANTUS) 100 UNIT/ML injection Inject 20 Units into the skin daily.      . insulin lispro (HUMALOG) 100 UNIT/ML injection Inject 8 Units into the skin 2 (two) times daily with a meal.      . metFORMIN (GLUCOPHAGE) 500 MG  tablet Take 500 mg by mouth 2 (two) times daily with a meal.      . metoprolol tartrate (LOPRESSOR) 25 MG tablet Take 0.5 tablets (12.5 mg total) by mouth 2 (two) times daily.  60 tablet  11  . nitroGLYCERIN (NITROSTAT) 0.4 MG SL tablet Place 1 tablet (0.4 mg total) under the tongue every 5 (five) minutes as needed (up to 3 doses).      . Omega-3 Fatty Acids (FISH OIL) 1200 MG CAPS Take 1 capsule by mouth daily.        . pantoprazole (PROTONIX) 40 MG tablet TAKE ONE TABLET BY MOUTH ONE TIME DAILY   30 tablet  3  . ramipril (ALTACE) 10 MG capsule Take 1 capsule (10 mg total) by mouth daily.  30 capsule  5  . vitamin C (ASCORBIC ACID) 500 MG tablet Take 500 mg by mouth daily.        Marland Kitchen warfarin (COUMADIN) 5 MG tablet Take as directed by anticoagulation clinic  30 tablet  3   No current facility-administered medications on file prior to visit.     Past Medical History  Diagnosis Date  . HTN (hypertension)   . Hyperlipidemia   . Diabetes mellitus type II, controlled     a. Variable CBG 02/2012 - several meds  adjusted.  . Mild aortic stenosis     a. 03/2011 Echo: EF 55-60%, Mild AS. b. Mild by cath 02/2012.  . Carotid disease, bilateral     a. 03/2011 U/S: 40-59% bilat Carotid dzs  . Obesity   . GERD (gastroesophageal reflux disease)   . PAF (paroxysmal atrial fibrillation)     a. Newly dx 02/2012 & initiated on Coumadin (spont converted to NSR).  Marland Kitchen CAD (coronary artery disease)     a. 01/2010 CABG x 4: LIMA->LAD, VG->D1, VG->OM1, VG->PDA. b. NSTEMI 02/2012 in setting of AF-RVR with cath s/p BMS to RCA (plan for 1 month, possibly up to 3 months of Plavix)  . Sleep apnea      Past Surgical History  Procedure Laterality Date  . Cyst l kidney    . Angioplasty    . Coronary artery bypass graft    . Cardiac catheterization    . Eye surgery    . Hernia repair       Family History  Problem Relation Age of Onset  . COPD Mother     alive 39  . Other Father     alive 91      History   Social History  . Marital Status: Married    Spouse Name: N/A    Number of Children: N/A  . Years of Education: N/A   Occupational History  . Not on file.   Social History Main Topics  . Smoking status: Never Smoker   . Smokeless tobacco: Not on file     Comment: tobacco use - no  . Alcohol Use: Yes     Comment: rare drink  . Drug Use: No  . Sexual Activity: Yes   Other Topics Concern  . Not on file   Social History Narrative   Full time. Gets regular exercise. Lives in [place] with his wife.  Retired HS football/basketball official.      PHYSICAL EXAM   BP 158/76  Pulse 41  Ht 5\' 8"  (1.727 m)  Wt 195 lb 8 oz (88.678 kg)  BMI 29.73 kg/m2 Constitutional: He is oriented to person, place, and time. He appears well-developed and well-nourished. No distress.  HENT: No nasal discharge.  Head: Normocephalic and atraumatic.  Eyes: Pupils are equal and round. Right eye exhibits no discharge. Left eye exhibits no discharge.  Neck: Normal range of motion. Neck supple. No JVD present. No thyromegaly present.  Cardiovascular: Bradycardic, regular rhythm, normal heart sounds and. Exam reveals no gallop and no friction rub. No murmur heard.  There is a 2/6 systolic ejection murmur at the aortic area which is early peaking Pulmonary/Chest: Effort normal and breath sounds normal. No stridor. No respiratory distress. He has no wheezes. He has no rales. He exhibits no tenderness.  Abdominal: Soft. Bowel sounds are normal. He exhibits no distension. There is no tenderness. There is no rebound and no guarding.  Musculoskeletal: Normal range of motion. He exhibits trace edema and no tenderness.  Neurological: He is alert and oriented to person, place, and time. Coordination normal.  Skin: Skin is warm and dry. No rash noted. He is not diaphoretic. No erythema. No pallor.  Psychiatric: He has a normal mood and affect. His behavior is normal. Judgment and thought content  normal.       SEG:BTDVV bradycardia with a heart rate of 41 beats per minute, left ventricular hypertrophy with repolarization abnormalities.   ASSESSMENT AND PLAN

## 2013-08-14 NOTE — Assessment & Plan Note (Signed)
He continues to be in sinus rhythm on Multaq with no recurrent arrhythmia. He is significantly bradycardic. Thus, I stopped metoprolol today. Continue long-term anticoagulation with warfarin.

## 2013-08-14 NOTE — Assessment & Plan Note (Signed)
No symptoms of angina. Continue small dose aspirin given history of stenting.

## 2013-08-14 NOTE — Patient Instructions (Signed)
Your physician has recommended you make the following change in your medication:  Stop Metoprolol  Continue other medications   Your physician wants you to follow-up in: 6 months.  You will receive a reminder letter in the mail two months in advance. If you don't receive a letter, please call our office to schedule the follow-up appointment.

## 2013-08-14 NOTE — Assessment & Plan Note (Signed)
He has moderate bilateral ICA stenosis (40-59%). Recommend a followup carotid duplex.

## 2013-08-22 ENCOUNTER — Telehealth: Payer: Self-pay | Admitting: *Deleted

## 2013-08-22 NOTE — Telephone Encounter (Signed)
Patient called and has several questions for you (over all health questions) ??? Please call patient

## 2013-08-22 NOTE — Telephone Encounter (Signed)
Patient has heard of an OTC medication called Marine D3. He heard it is really good and wants to know if he can try it with his meds/conditions.  I instructed him to speak with his PCP about this and I would inform Dr. Fletcher Anon

## 2013-08-27 NOTE — Telephone Encounter (Signed)
Marine D3 contains fish oil which might interact with Warfarin and make the blood thinner. He already takes fish oil but adding the new supplement might give him large doses of it. If he wants to try it, he should stop taking Fish oil and check INR 1 week after starting it to make sure it stays ok.

## 2013-08-28 NOTE — Telephone Encounter (Signed)
Spoke with patient he states he's not going try the medication. Pt will call us if he has anymore questions.

## 2013-08-31 ENCOUNTER — Other Ambulatory Visit: Payer: Self-pay | Admitting: *Deleted

## 2013-08-31 MED ORDER — AMLODIPINE BESYLATE 5 MG PO TABS: ORAL_TABLET | ORAL | Status: DC

## 2013-08-31 NOTE — Telephone Encounter (Signed)
Pt does not currently take Amiodarone. He is taking Multaq. Please do not refill request unless change has been made or updated by Dr. Fletcher Anon.  Requested Prescriptions   Pending Prescriptions Disp Refills  . amLODipine (NORVASC) 5 MG tablet 90 tablet 3    Sig: Take one tablet by mouth one time daily

## 2013-10-03 ENCOUNTER — Telehealth: Payer: Self-pay | Admitting: *Deleted

## 2013-10-03 NOTE — Telephone Encounter (Signed)
Pt picked up samples of multaq.

## 2013-10-03 NOTE — Telephone Encounter (Signed)
Samples of Multac

## 2013-10-13 ENCOUNTER — Telehealth: Payer: Self-pay

## 2013-10-13 NOTE — Telephone Encounter (Signed)
Pt would like Multaq samples. Please call.

## 2013-10-13 NOTE — Telephone Encounter (Signed)
Samples left for patient at front desk

## 2013-10-17 ENCOUNTER — Telehealth: Payer: Self-pay | Admitting: *Deleted

## 2013-10-17 ENCOUNTER — Encounter: Payer: Self-pay | Admitting: *Deleted

## 2013-10-17 NOTE — Telephone Encounter (Signed)
Patient to fax jury summons for Dr. Jacklynn Ganong review 5/26

## 2013-10-17 NOTE — Telephone Encounter (Signed)
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  Needs it today, if possible. Please call patient.

## 2013-10-17 NOTE — Telephone Encounter (Signed)
Informed patient that per Dr. Fletcher Anon we can not excuse him from [facility] duty.  Patient verbalized understanding

## 2013-11-01 ENCOUNTER — Ambulatory Visit (INDEPENDENT_AMBULATORY_CARE_PROVIDER_SITE_OTHER): Payer: Medicare Other

## 2013-11-01 DIAGNOSIS — I4891 Unspecified atrial fibrillation: Secondary | ICD-10-CM

## 2013-11-01 DIAGNOSIS — Z7901 Long term (current) use of anticoagulants: Secondary | ICD-10-CM

## 2013-11-01 LAB — POCT INR: INR: 2.4

## 2013-11-22 ENCOUNTER — Telehealth: Payer: Self-pay

## 2013-11-22 NOTE — Telephone Encounter (Signed)
Placed samples of Multaq 400 mg @ front desk for pick up.

## 2013-11-22 NOTE — Telephone Encounter (Signed)
Pt would like Multaq samples, pt would like call ASAP, he is getting ready to leave to go out of town.

## 2013-11-28 NOTE — Telephone Encounter (Signed)
This encounter was created in error - please disregard.

## 2013-12-05 ENCOUNTER — Telehealth: Payer: Self-pay | Admitting: *Deleted

## 2013-12-05 NOTE — Telephone Encounter (Signed)
Pt wanted to know if he could cut back to one a day on multaq. Please call back

## 2013-12-05 NOTE — Telephone Encounter (Signed)
Placed samples of Multaq at front desk for pick up.

## 2013-12-05 NOTE — Telephone Encounter (Signed)
Needing samples of Multaq in the [place] area this morning. Please call ready [phone]

## 2013-12-13 ENCOUNTER — Ambulatory Visit (INDEPENDENT_AMBULATORY_CARE_PROVIDER_SITE_OTHER): Payer: Medicare Other

## 2013-12-13 DIAGNOSIS — Z7901 Long term (current) use of anticoagulants: Secondary | ICD-10-CM

## 2013-12-13 DIAGNOSIS — I4891 Unspecified atrial fibrillation: Secondary | ICD-10-CM

## 2013-12-13 LAB — POCT INR: INR: 2.7

## 2013-12-22 ENCOUNTER — Telehealth: Payer: Self-pay

## 2013-12-22 NOTE — Telephone Encounter (Signed)
Samples Multaq 400 mg @ front desk for pick up.

## 2013-12-22 NOTE — Telephone Encounter (Signed)
Pt would like Multaq samples. Would like to pick up today. Please call.

## 2014-01-04 DIAGNOSIS — G4733 Obstructive sleep apnea (adult) (pediatric): Secondary | ICD-10-CM | POA: Insufficient documentation

## 2014-01-04 DIAGNOSIS — E1139 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: Secondary | ICD-10-CM | POA: Insufficient documentation

## 2014-01-04 DIAGNOSIS — Z8679 Personal history of other diseases of the circulatory system: Secondary | ICD-10-CM | POA: Insufficient documentation

## 2014-01-23 ENCOUNTER — Ambulatory Visit (INDEPENDENT_AMBULATORY_CARE_PROVIDER_SITE_OTHER): Payer: Medicare Other | Admitting: Cardiovascular Disease

## 2014-01-23 ENCOUNTER — Ambulatory Visit (INDEPENDENT_AMBULATORY_CARE_PROVIDER_SITE_OTHER): Payer: Medicare Other | Admitting: *Deleted

## 2014-01-23 ENCOUNTER — Encounter: Payer: Self-pay | Admitting: Cardiovascular Disease

## 2014-01-23 VITALS — BP 144/72 | HR 54 | Ht 68.0 in | Wt 193.2 lb

## 2014-01-23 DIAGNOSIS — I359 Nonrheumatic aortic valve disorder, unspecified: Secondary | ICD-10-CM

## 2014-01-23 DIAGNOSIS — I25810 Atherosclerosis of coronary artery bypass graft(s) without angina pectoris: Secondary | ICD-10-CM

## 2014-01-23 DIAGNOSIS — I4891 Unspecified atrial fibrillation: Secondary | ICD-10-CM

## 2014-01-23 DIAGNOSIS — I350 Nonrheumatic aortic (valve) stenosis: Secondary | ICD-10-CM

## 2014-01-23 DIAGNOSIS — I6529 Occlusion and stenosis of unspecified carotid artery: Secondary | ICD-10-CM

## 2014-01-23 DIAGNOSIS — Z7901 Long term (current) use of anticoagulants: Secondary | ICD-10-CM

## 2014-01-23 LAB — POCT INR: INR: 1.6

## 2014-01-23 MED ORDER — AMIODARONE HCL 200 MG PO TABS: 200.0000 mg | ORAL_TABLET | Freq: Every day | ORAL | Status: DC

## 2014-01-23 NOTE — Assessment & Plan Note (Signed)
He is due for a followup echocardiogram which was requested.

## 2014-01-23 NOTE — Assessment & Plan Note (Signed)
He has moderate bilateral ICA stenosis (40-59%). Recommend a yearly followup carotid duplex.

## 2014-01-23 NOTE — Patient Instructions (Signed)
Your physician has requested that you have an echocardiogram. Echocardiography is a painless test that uses sound waves to create images of your heart. It provides your doctor with information about the size and shape of your heart and how well your heart's chambers and valves are working. This procedure takes approximately one hour. There are no restrictions for this procedure.   Your physician has recommended you make the following change in your medication:  Stop Multaq  Start Amiodarone 200 mg once daily   Your physician recommends that you schedule a follow-up appointment in:  Next Wednesday with [facility] 01/31/14  Your physician recommends that you schedule a follow-up appointment in:  3 months with Dr. Fletcher Anon

## 2014-01-23 NOTE — Assessment & Plan Note (Signed)
The patient is maintaining in sinus rhythm. However, he has not been able to afford Multaq and has taken amiodarone instead. Thus, I went ahead and switched him completely to amiodarone 200 mg once daily in order to avoid in expected effects of switching between medications especially that he is on warfarin. Check INR today and again next week. If he maintains sinus rhythm on the 200 mg dose, I am planning to decrease it to 100 mg.

## 2014-01-23 NOTE — Progress Notes (Signed)
HPI  [name] is a 68 -year-old man who is here today for a followup visit. He has known history of coronary artery disease status post CABG,  history of a non-STEMI in the setting of paroxysmal atrial fibrillation with a rapid ventricular rate in October of 2013, status post bare-metal stent to right coronary artery, anticoagulation therapy, hypertension, hyperlipidemia, type 2 diabetes, bilateral moderate carotid disease, and mild aortic stenosis.   He was placed on Multaq due to recurrent symptomatic episodes of atrial fibrillation causing coronary ischemia. He had no recurrent episodes of A. fib or palpitations since she was started on this medication. However, he is having hard time affording this medication as it makes him go into the "donut hole"after about 5 months.  Otherwise he has been doing well with no chest pain or significant dyspnea. He has been bradycardic but he denies dizziness, syncope or presyncope. He ran out of Multaq recently been started taking amiodarone instead.   No Known Allergies   Current Outpatient Prescriptions on File Prior to Visit  Medication Sig Dispense Refill  . acetaminophen (TYLENOL) 325 MG tablet Take 650 mg by mouth every 6 (six) hours as needed. For pain      . amLODipine (NORVASC) 5 MG tablet Take one tablet by mouth one time daily  90 tablet  3  . aspirin 81 MG EC tablet Take 81 mg by mouth daily.        Marland Kitchen atorvastatin (LIPITOR) 20 MG tablet TAKE ONE TABLET BY MOUTH NIGHTLY AT BEDTIME   30 tablet  1  . furosemide (LASIX) 20 MG tablet Take 20 mg by mouth as needed.      Marland Kitchen glimepiride (AMARYL) 4 MG tablet Take 4 mg by mouth 2 (two) times daily.        . insulin glargine (LANTUS) 100 UNIT/ML injection Inject 20 Units into the skin daily.      . insulin lispro (HUMALOG) 100 UNIT/ML injection Inject 8 Units into the skin 2 (two) times daily with a meal.      . metFORMIN (GLUCOPHAGE) 500 MG tablet Take 500 mg by mouth 2 (two) times daily with a  meal.      . nitroGLYCERIN (NITROSTAT) 0.4 MG SL tablet Place 1 tablet (0.4 mg total) under the tongue every 5 (five) minutes as needed (up to 3 doses).      . Omega-3 Fatty Acids (FISH OIL) 1200 MG CAPS Take 1 capsule by mouth daily.        . pantoprazole (PROTONIX) 40 MG tablet TAKE ONE TABLET BY MOUTH ONE TIME DAILY   30 tablet  3  . ramipril (ALTACE) 10 MG capsule Take 1 capsule (10 mg total) by mouth daily.  30 capsule  5  . vitamin C (ASCORBIC ACID) 500 MG tablet Take 500 mg by mouth daily.        Marland Kitchen warfarin (COUMADIN) 5 MG tablet Take as directed by anticoagulation clinic  30 tablet  3   No current facility-administered medications on file prior to visit.     Past Medical History  Diagnosis Date  . HTN (hypertension)   . Hyperlipidemia   . Diabetes mellitus type II, controlled     a. Variable CBG 02/2012 - several meds adjusted.  . Mild aortic stenosis     a. 03/2011 Echo: EF 55-60%, Mild AS. b. Mild by cath 02/2012.  . Carotid disease, bilateral     a. 03/2011 U/S: 40-59% bilat Carotid dzs  .  Obesity   . GERD (gastroesophageal reflux disease)   . PAF (paroxysmal atrial fibrillation)     a. Newly dx 02/2012 & initiated on Coumadin (spont converted to NSR).  Marland Kitchen CAD (coronary artery disease)     a. 01/2010 CABG x 4: LIMA->LAD, VG->D1, VG->OM1, VG->PDA. b. NSTEMI 02/2012 in setting of AF-RVR with cath s/p BMS to RCA (plan for 1 month, possibly up to 3 months of Plavix)  . Sleep apnea      Past Surgical History  Procedure Laterality Date  . Cyst l kidney    . Angioplasty    . Coronary artery bypass graft    . Cardiac catheterization    . Eye surgery    . Hernia repair       Family History  Problem Relation Age of Onset  . COPD Mother     alive 68  . Other Father     alive 60     History   Social History  . Marital Status: Married    Spouse Name: N/A    Number of Children: N/A  . Years of Education: N/A   Occupational History  . Not on file.   Social  History Main Topics  . Smoking status: Never Smoker   . Smokeless tobacco: Not on file     Comment: tobacco use - no  . Alcohol Use: No     Comment: rare drink  . Drug Use: No  . Sexual Activity: Yes   Other Topics Concern  . Not on file   Social History Narrative   Full time. Gets regular exercise. Lives in [place] with his wife.  Retired HS football/basketball official.      PHYSICAL EXAM   BP 144/72  Pulse 54  Ht 5\' 8"  (1.727 m)  Wt 193 lb 4 oz (87.658 kg)  BMI 29.39 kg/m2 Constitutional: He is oriented to person, place, and time. He appears well-developed and well-nourished. No distress.  HENT: No nasal discharge.  Head: Normocephalic and atraumatic.  Eyes: Pupils are equal and round. Right eye exhibits no discharge. Left eye exhibits no discharge.  Neck: Normal range of motion. Neck supple. No JVD present. No thyromegaly present.  Cardiovascular: Bradycardic, regular rhythm, normal heart sounds and. Exam reveals no gallop and no friction rub. No murmur heard.  There is a 2/6 systolic ejection murmur at the aortic area which is early peaking Pulmonary/Chest: Effort normal and breath sounds normal. No stridor. No respiratory distress. He has no wheezes. He has no rales. He exhibits no tenderness.  Abdominal: Soft. Bowel sounds are normal. He exhibits no distension. There is no tenderness. There is no rebound and no guarding.  Musculoskeletal: Normal range of motion. He exhibits trace edema and no tenderness.  Neurological: He is alert and oriented to person, place, and time. Coordination normal.  Skin: Skin is warm and dry. No rash noted. He is not diaphoretic. No erythema. No pallor.  Psychiatric: He has a normal mood and affect. His behavior is normal. Judgment and thought content normal.       QRF:XJOIT  Bradycardia  Possible left ventricular hypertrophy on non-voltage basis.   -Nonspecific ST depression   +   T-abnormality  -Seen with left ventricular hypertrophy  (strain) or digitalis effect  -Possible  Anterior/lateral  ischemia.   ABNORMAL    ASSESSMENT AND PLAN

## 2014-01-23 NOTE — Assessment & Plan Note (Signed)
He has no symptoms of angina. Continue medical therapy. 

## 2014-01-31 ENCOUNTER — Ambulatory Visit (INDEPENDENT_AMBULATORY_CARE_PROVIDER_SITE_OTHER): Payer: Medicare Other

## 2014-01-31 DIAGNOSIS — I4891 Unspecified atrial fibrillation: Secondary | ICD-10-CM

## 2014-01-31 DIAGNOSIS — Z7901 Long term (current) use of anticoagulants: Secondary | ICD-10-CM

## 2014-01-31 LAB — POCT INR: INR: 2.1

## 2014-02-01 ENCOUNTER — Other Ambulatory Visit: Payer: Self-pay

## 2014-02-01 MED ORDER — AMIODARONE HCL 200 MG PO TABS: 200.0000 mg | ORAL_TABLET | Freq: Every day | ORAL | Status: DC

## 2014-02-02 ENCOUNTER — Other Ambulatory Visit: Payer: Self-pay

## 2014-02-02 MED ORDER — FUROSEMIDE 20 MG PO TABS: 20.0000 mg | ORAL_TABLET | ORAL | Status: DC | PRN

## 2014-02-02 NOTE — Telephone Encounter (Signed)
Refill sent for lasix

## 2014-02-13 ENCOUNTER — Other Ambulatory Visit (INDEPENDENT_AMBULATORY_CARE_PROVIDER_SITE_OTHER): Payer: Medicare Other

## 2014-02-13 ENCOUNTER — Other Ambulatory Visit: Payer: Self-pay

## 2014-02-13 ENCOUNTER — Ambulatory Visit (INDEPENDENT_AMBULATORY_CARE_PROVIDER_SITE_OTHER): Payer: Medicare Other | Admitting: Pharmacist

## 2014-02-13 ENCOUNTER — Other Ambulatory Visit: Payer: Medicare Other

## 2014-02-13 DIAGNOSIS — I359 Nonrheumatic aortic valve disorder, unspecified: Secondary | ICD-10-CM

## 2014-02-13 DIAGNOSIS — Z7901 Long term (current) use of anticoagulants: Secondary | ICD-10-CM

## 2014-02-13 DIAGNOSIS — I2510 Atherosclerotic heart disease of native coronary artery without angina pectoris: Secondary | ICD-10-CM

## 2014-02-13 DIAGNOSIS — I4891 Unspecified atrial fibrillation: Secondary | ICD-10-CM

## 2014-02-13 LAB — POCT INR: INR: 2.9

## 2014-03-01 ENCOUNTER — Ambulatory Visit (INDEPENDENT_AMBULATORY_CARE_PROVIDER_SITE_OTHER): Payer: Medicare Other | Admitting: Pharmacist

## 2014-03-01 DIAGNOSIS — I4891 Unspecified atrial fibrillation: Secondary | ICD-10-CM

## 2014-03-01 DIAGNOSIS — Z7901 Long term (current) use of anticoagulants: Secondary | ICD-10-CM

## 2014-03-01 LAB — POCT INR: INR: 2

## 2014-04-04 ENCOUNTER — Ambulatory Visit (INDEPENDENT_AMBULATORY_CARE_PROVIDER_SITE_OTHER): Payer: Medicare Other | Admitting: Pharmacist

## 2014-04-04 DIAGNOSIS — Z7901 Long term (current) use of anticoagulants: Secondary | ICD-10-CM

## 2014-04-04 DIAGNOSIS — I4891 Unspecified atrial fibrillation: Secondary | ICD-10-CM

## 2014-04-04 LAB — POCT INR: INR: 2.7

## 2014-04-24 ENCOUNTER — Ambulatory Visit: Payer: Medicare Other | Admitting: Cardiovascular Disease

## 2014-04-30 ENCOUNTER — Encounter: Payer: Self-pay | Admitting: Cardiovascular Disease

## 2014-04-30 ENCOUNTER — Ambulatory Visit (INDEPENDENT_AMBULATORY_CARE_PROVIDER_SITE_OTHER): Payer: Medicare Other | Admitting: Cardiovascular Disease

## 2014-04-30 VITALS — BP 148/70 | HR 52 | Ht 68.0 in | Wt 190.8 lb

## 2014-04-30 DIAGNOSIS — I6523 Occlusion and stenosis of bilateral carotid arteries: Secondary | ICD-10-CM

## 2014-04-30 DIAGNOSIS — I4891 Unspecified atrial fibrillation: Secondary | ICD-10-CM

## 2014-04-30 DIAGNOSIS — I350 Nonrheumatic aortic (valve) stenosis: Secondary | ICD-10-CM

## 2014-04-30 DIAGNOSIS — I25708 Atherosclerosis of coronary artery bypass graft(s), unspecified, with other forms of angina pectoris: Secondary | ICD-10-CM

## 2014-04-30 MED ORDER — AMIODARONE HCL 200 MG PO TABS: 100.0000 mg | ORAL_TABLET | Freq: Every day | ORAL | Status: DC

## 2014-04-30 NOTE — Assessment & Plan Note (Signed)
This was most recently evaluated in September and it was still mild.

## 2014-04-30 NOTE — Assessment & Plan Note (Signed)
The patient is maintaining in sinus rhythm on amiodarone. I will check liver and thyroid panel today. I decreased the dose to 100 mg once daily. Continue long-term anticoagulation with warfarin.

## 2014-04-30 NOTE — Patient Instructions (Signed)
Labs today.   Schedule carotid doppler in March.   Decrease Amiodarone to 100 mg once daily.   Your physician wants you to follow-up in: 6 months.  You will receive a reminder letter in the mail two months in advance. If you don't receive a letter, please call our office to schedule the follow-up appointment.

## 2014-04-30 NOTE — Assessment & Plan Note (Signed)
He has moderate bilateral carotid stenosis. Recommend a follow-up carotid Doppler in January.

## 2014-04-30 NOTE — Progress Notes (Signed)
HPI  Seth Hardy is a 68 -year-old man who is here today for a followup visit. He has known history of coronary artery disease status post CABG,  history of a non-STEMI in the setting of paroxysmal atrial fibrillation with a rapid ventricular rate in October of 2013, status post bare-metal stent to right coronary artery, anticoagulation therapy, hypertension, hyperlipidemia, type 2 diabetes, bilateral moderate carotid disease, and mild aortic stenosis.   He was placed on Multaq due to recurrent symptomatic episodes of atrial fibrillation causing coronary ischemia. He had no recurrent episodes of A. fib or palpitations since she was started on this medication. However, he could not afford this medication as it made him go into the "donut hole"after about 5 months.  He was thus switched to amiodarone. He reports no palpitations, shortness of breath or chest pain.  No Known Allergies   Current Outpatient Prescriptions on File Prior to Visit  Medication Sig Dispense Refill  . acetaminophen (TYLENOL) 325 MG tablet Take 650 mg by mouth every 6 (six) hours as needed. For pain    . amLODipine (NORVASC) 5 MG tablet Take one tablet by mouth one time daily 90 tablet 3  . aspirin 81 MG EC tablet Take 81 mg by mouth daily.      Marland Kitchen atorvastatin (LIPITOR) 20 MG tablet TAKE ONE TABLET BY MOUTH NIGHTLY AT BEDTIME  30 tablet 1  . furosemide (LASIX) 20 MG tablet Take 1 tablet (20 mg total) by mouth as needed. 30 tablet 6  . glimepiride (AMARYL) 4 MG tablet Take 4 mg by mouth 2 (two) times daily.      . insulin glargine (LANTUS) 100 UNIT/ML injection Inject 20 Units into the skin daily.    . insulin lispro (HUMALOG) 100 UNIT/ML injection Inject 8 Units into the skin 2 (two) times daily with a meal.    . metFORMIN (GLUCOPHAGE) 500 MG tablet Take 500 mg by mouth 2 (two) times daily with a meal.    . nitroGLYCERIN (NITROSTAT) 0.4 MG SL tablet Place 1 tablet (0.4 mg total) under the tongue every 5 (five) minutes as  needed (up to 3 doses).    . Omega-3 Fatty Acids (FISH OIL) 1200 MG CAPS Take 1 capsule by mouth daily.      . pantoprazole (PROTONIX) 40 MG tablet TAKE ONE TABLET BY MOUTH ONE TIME DAILY  30 tablet 3  . ramipril (ALTACE) 10 MG capsule Take 1 capsule (10 mg total) by mouth daily. 30 capsule 5  . vitamin C (ASCORBIC ACID) 500 MG tablet Take 500 mg by mouth daily.      Marland Kitchen warfarin (COUMADIN) 5 MG tablet Take as directed by anticoagulation clinic 30 tablet 3   No current facility-administered medications on file prior to visit.     Past Medical History  Diagnosis Date  . HTN (hypertension)   . Hyperlipidemia   . Diabetes mellitus type II, controlled     a. Variable CBG 02/2012 - several meds adjusted.  . Mild aortic stenosis     a. 03/2011 Echo: EF 55-60%, Mild AS. b. Mild by cath 02/2012.  . Carotid disease, bilateral     a. 03/2011 U/S: 40-59% bilat Carotid dzs  . Obesity   . GERD (gastroesophageal reflux disease)   . PAF (paroxysmal atrial fibrillation)     a. Newly dx 02/2012 & initiated on Coumadin (spont converted to NSR).  Marland Kitchen CAD (coronary artery disease)     a. 01/2010 CABG x 4: LIMA->LAD, VG->D1,  VG->OM1, VG->PDA. b. NSTEMI 02/2012 in setting of AF-RVR with cath s/p BMS to RCA (plan for 1 month, possibly up to 3 months of Plavix)  . Sleep apnea      Past Surgical History  Procedure Laterality Date  . Cyst l kidney    . Angioplasty    . Coronary artery bypass graft    . Cardiac catheterization    . Eye surgery    . Hernia repair       Family History  Problem Relation Age of Onset  . COPD Mother     alive 88  . Other Father     alive 71     History   Social History  . Marital Status: Married    Spouse Name: N/A    Number of Children: N/A  . Years of Education: N/A   Occupational History  . Not on file.   Social History Main Topics  . Smoking status: Never Smoker   . Smokeless tobacco: Not on file     Comment: tobacco use - no  . Alcohol Use: No      Comment: rare drink  . Drug Use: No  . Sexual Activity: Yes   Other Topics Concern  . Not on file   Social History Narrative   Full time. Gets regular exercise. Lives in [place] with his wife.  Retired HS football/basketball official.      PHYSICAL EXAM   BP 148/70 mmHg  Pulse 52  Ht 5\' 8"  (1.727 m)  Wt 190 lb 12 oz (86.524 kg)  BMI 29.01 kg/m2 Constitutional: He is oriented to person, place, and time. He appears well-developed and well-nourished. No distress.  HENT: No nasal discharge.  Head: Normocephalic and atraumatic.  Eyes: Pupils are equal and round. Right eye exhibits no discharge. Left eye exhibits no discharge.  Neck: Normal range of motion. Neck supple. No JVD present. No thyromegaly present.  Cardiovascular: Bradycardic, regular rhythm, normal heart sounds and. Exam reveals no gallop and no friction rub. No murmur heard.  There is a 2/6 systolic ejection murmur at the aortic area which is early peaking Pulmonary/Chest: Effort normal and breath sounds normal. No stridor. No respiratory distress. He has no wheezes. He has no rales. He exhibits no tenderness.  Abdominal: Soft. Bowel sounds are normal. He exhibits no distension. There is no tenderness. There is no rebound and no guarding.  Musculoskeletal: Normal range of motion. He exhibits trace edema and no tenderness.  Neurological: He is alert and oriented to person, place, and time. Coordination normal.  Skin: Skin is warm and dry. No rash noted. He is not diaphoretic. No erythema. No pallor.  Psychiatric: He has a normal mood and affect. His behavior is normal. Judgment and thought content normal.       EKG: Sinus  Bradycardia  -Nonspecific ST depression   +   T-abnormality  -Nondiagnostic  -Possible  Anterior/lateral  ischemia.   ABNORMAL     ASSESSMENT AND PLAN

## 2014-04-30 NOTE — Assessment & Plan Note (Signed)
He has no symptoms of angina. Continue medical therapy. 

## 2014-05-01 LAB — TSH: TSH: 1.69 u[IU]/mL (ref 0.450–4.500)

## 2014-05-01 LAB — HEPATIC FUNCTION PANEL: Bilirubin, Direct: 0.14 mg/dL (ref 0.00–0.40)

## 2014-05-03 ENCOUNTER — Encounter (HOSPITAL_COMMUNITY): Payer: Self-pay | Admitting: Cardiovascular Disease

## 2014-05-08 NOTE — Progress Notes (Signed)
[facility] to fax liver panel

## 2014-05-21 ENCOUNTER — Ambulatory Visit (INDEPENDENT_AMBULATORY_CARE_PROVIDER_SITE_OTHER): Payer: Medicare Other

## 2014-05-21 DIAGNOSIS — Z7901 Long term (current) use of anticoagulants: Secondary | ICD-10-CM

## 2014-05-21 DIAGNOSIS — I4891 Unspecified atrial fibrillation: Secondary | ICD-10-CM

## 2014-05-21 LAB — POCT INR: INR: 2.4

## 2014-05-25 HISTORY — PX: CATARACT EXTRACTION W/ INTRAOCULAR LENS  IMPLANT, BILATERAL: SHX1307

## 2014-05-27 ENCOUNTER — Telehealth: Payer: Self-pay | Admitting: Physician Assistant

## 2014-05-27 NOTE — Telephone Encounter (Signed)
I have called back, it seems Seth Hardy has been going in and out of a-fib lasting 30 mins at a time. His amio was recently cut back to 100mg  daily. I have instructed him to resume 200mg  daily and schedule followup with Dr. Fletcher Anon as soon as they come back from [place].   Hilbert Corrigan PA Pager: [phone]

## 2014-05-27 NOTE — Telephone Encounter (Signed)
Mrs. Seth Hardy paged the after hour service as [name] (h/o CAD s/p CABG, PAF, HTN, HLD, DM, carotid dx)  recently went to [place] and had recurrent a-fib twice. They are wondering if need medication change. He is currently on amiodarone and coumadin. His amiodarone was recently decreased to 100 mg once a day, unclear why he is not on any rate control medication.   I called the cell phone, it was busy. I called the house phone, no answer, I left message for him and will call back in 30 min.  Hilbert Corrigan PA Pager: [phone]

## 2014-06-11 ENCOUNTER — Other Ambulatory Visit: Payer: Self-pay

## 2014-06-11 ENCOUNTER — Encounter: Payer: Self-pay | Admitting: *Deleted

## 2014-06-11 MED ORDER — RAMIPRIL 10 MG PO CAPS: 10.0000 mg | ORAL_CAPSULE | Freq: Every day | ORAL | Status: DC

## 2014-06-11 MED ORDER — PANTOPRAZOLE SODIUM 40 MG PO TBEC: 40.0000 mg | DELAYED_RELEASE_TABLET | Freq: Every day | ORAL | Status: DC

## 2014-06-13 NOTE — Progress Notes (Signed)
[facility] to fax hepatic function panel 1/20

## 2014-07-04 ENCOUNTER — Ambulatory Visit (INDEPENDENT_AMBULATORY_CARE_PROVIDER_SITE_OTHER): Payer: PPO | Admitting: Pharmacist

## 2014-07-04 DIAGNOSIS — Z7901 Long term (current) use of anticoagulants: Secondary | ICD-10-CM

## 2014-07-04 DIAGNOSIS — I4891 Unspecified atrial fibrillation: Secondary | ICD-10-CM

## 2014-07-04 LAB — POCT INR: INR: 2.4

## 2014-07-06 ENCOUNTER — Telehealth: Payer: Self-pay | Admitting: *Deleted

## 2014-07-06 DIAGNOSIS — E785 Hyperlipidemia, unspecified: Secondary | ICD-10-CM

## 2014-07-06 NOTE — Telephone Encounter (Signed)
Notes Recorded by Wellington Hampshire, MD on 07/05/2014 at 3:20 PM In 6 months. Notes Recorded by Tracie Harrier, RN on 07/04/2014 at 9:16 AM Reviewed results with patients wife  She would like to know when labs will be checked again Notes Recorded by Wellington Hampshire, MD on 07/01/2014 at 10:37 AM Normal labs except mildly elevated alkaline phosphatase. Will monitor. Notes Recorded by Tracie Harrier, RN on 06/26/2014 at 11:46 AM Liver function has been scanned  See result notes Notes Recorded by Wellington Hampshire, MD on 05/07/2014 at 2:34 PM Inform patient that labs were normal but I don't see the rest of LFTs. Notes Recorded by Tracie Harrier, RN on 05/01/2014 at 4:34 PM RN reviewed. Forwarded to MD.  Notes Recorded by Tracie Harrier, RN on 05/01/2014 at 8:14 AM RN reviewed. Forwarded to MD.

## 2014-07-25 ENCOUNTER — Other Ambulatory Visit: Payer: Self-pay

## 2014-07-25 MED ORDER — AMLODIPINE BESYLATE 5 MG PO TABS: ORAL_TABLET | ORAL | Status: AC

## 2014-07-30 ENCOUNTER — Telehealth: Payer: Self-pay

## 2014-07-30 NOTE — Telephone Encounter (Signed)
Pt wife called, states pt is coming this a.m. For carotid, states pt is very nausea, and vomiting. States this only happens when his heart races. States when his sugar drops his heart races. Please call and advise.

## 2014-07-30 NOTE — Telephone Encounter (Signed)
Spoke with patients wife  Patient denies chest pain  Instructed patient to cancel test today and go to urgent care asap  Dr Fletcher Anon agrees  Patient verbalized understanding

## 2014-08-02 ENCOUNTER — Other Ambulatory Visit: Payer: Self-pay | Admitting: *Deleted

## 2014-08-02 MED ORDER — ATORVASTATIN CALCIUM 20 MG PO TABS: 20.0000 mg | ORAL_TABLET | Freq: Every day | ORAL | Status: DC

## 2014-08-31 ENCOUNTER — Encounter (INDEPENDENT_AMBULATORY_CARE_PROVIDER_SITE_OTHER): Payer: PPO

## 2014-08-31 ENCOUNTER — Ambulatory Visit (INDEPENDENT_AMBULATORY_CARE_PROVIDER_SITE_OTHER): Payer: PPO | Admitting: *Deleted

## 2014-08-31 DIAGNOSIS — Z7901 Long term (current) use of anticoagulants: Secondary | ICD-10-CM | POA: Diagnosis not present

## 2014-08-31 DIAGNOSIS — I6523 Occlusion and stenosis of bilateral carotid arteries: Secondary | ICD-10-CM

## 2014-08-31 DIAGNOSIS — I4891 Unspecified atrial fibrillation: Secondary | ICD-10-CM | POA: Diagnosis not present

## 2014-08-31 LAB — POCT INR: INR: 2.4

## 2014-09-04 ENCOUNTER — Telehealth: Payer: Self-pay | Admitting: *Deleted

## 2014-09-04 NOTE — Telephone Encounter (Signed)
Pt returning our call, needing results on ultra sound he did recently.

## 2014-09-04 NOTE — Telephone Encounter (Signed)
Please see result note 

## 2014-09-13 DIAGNOSIS — I6522 Occlusion and stenosis of left carotid artery: Secondary | ICD-10-CM | POA: Insufficient documentation

## 2014-09-14 NOTE — Consult Note (Signed)
General Aspect Seth Hardy is a 69 year old male who is a patient of Dr. wall with past medical history of coronary artery disease, history bypass grafting, history of a non-STEMI in October of 2013 in the setting of paroxysmal atrial fibrillation with a rapid ventricular rate, status post bare-metal stent to right coronary artery, anticoagulation therapy, hypertension, hyperlipidemia, type 2 diabetes, bilateral carotid disease, and mild aortic stenosis who presented with atrial fibrillation with rapid ventricular response. he had atrial fibrillation with rapid ventricular response in October of 2013 with non-ST elevation myocardial infarction. I performed cardiac catheterization and placed in bare-metal stent to the proximal right coronary artery. he has been on metoprolol 25 mg twice daily. He had a second episode of A. fib with RVR a few weeks ago and came to the emergency room. He converted to sinus rhythm with diltiazem. Yesterday, after referring a high school football game, he started having palpitations with heart rate going up to 180 beats per minute. This was associated with mild chest discomfort. He came to the emergency room and was in A. fib with RVR. He converted with diltiazem but had prolonged pauses.   Present Illness Past Medical History   Diagnosis  Date   ???  CAD (coronary artery disease)         a. 01/2010 CABG x 4: LIMA->LAD, VG->D1, VG->OM1, VG->PDA. b. NSTEMI 02/2012 in setting of AF-RVR with cath s/p BMS to RCA (plan for 1 month, possibly up to 3 months of Plavix)   ???  HTN (hypertension)     ???  Hyperlipidemia     ???  Diabetes mellitus type II, controlled         a. Variable CBG 02/2012 - several meds adjusted.   ???  Mild aortic stenosis         a. 03/2012 Echo: EF 55-60%, Mild AS. b. Mild by cath 02/2012.   ???  Carotid disease, bilateral         a. 03/2011 U/S: 40-59% bilat Carotid dzs   ???  Obesity     ???  GERD (gastroesophageal reflux disease)     ???  PAF  (paroxysmal atrial fibrillation)         a. Newly dx 02/2012 & initiated on Coumadin (spont converted to NSR).   Physical Exam:  GEN no acute distress   NECK supple   RESP normal resp effort  clear BS   CARD Regular rate and rhythm  Murmur   Murmur Systolic   Systolic Murmur Out flow   ABD denies tenderness   LYMPH negative neck   EXTR negative cyanosis/clubbing, negative edema   PSYCH alert, A+O to time, place, person   Review of Systems:  Subjective/Chief Complaint palpitations   General: No Complaints   Skin: No Complaints   ENT: No Complaints   Eyes: No Complaints   Neck: No Complaints   Respiratory: No Complaints   Cardiovascular: Tightness  Palpitations   Gastrointestinal: No Complaints   Genitourinary: No Complaints   Vascular: No Complaints   Musculoskeletal: No Complaints   Neurologic: No Complaints   Hematologic: No Complaints   Endocrine: No Complaints   Psychiatric: No Complaints   Review of Systems: All other systems were reviewed and found to be negative   Home Medications: Medication Instructions Status  amLODIPine 10 mg oral tablet 1 tab(s) orally once a day Active  ascorbic acid 500 mg oral tablet 1 tab(s) orally once a day Active  aspirin 81 mg oral delayed release  tablet 1 tab(s) orally once a day Active  insulin glargine 100 units/mL subcutaneous solution 20 unit(s) subcutaneous once a day Active  HumaLOG 100 units/mL subcutaneous solution 8 unit(s) subcutaneous 2 times a day (with meals) Active  metFORMIN 500 mg oral tablet 1 tab(s) orally 2 times a day Active  Metoprolol Tartrate 25 mg oral tablet 1 tab(s) orally 2 times a day Active  nitroglycerin 0.4 mg sublingual spray 1 spray(s) sublingual every 5 minutes, As Needed for three times in fifteen minutes Active  omega-3 polyunsaturated fatty acids 1200 mg oral capsule 1 cap(s) orally once a day Active  pantoprazole 40 mg oral granule, enteric coated 1 each orally once a day  Active  ramipril 10 mg oral capsule 1 cap(s) orally once a day Active  Coumadin 5 mg oral tablet 1 tab(s) orally Monday, Wednesday, and Friday Active  Coumadin 2.5 mg oral tablet 1 tab(s) orally Tuesday, Thursday, Saturday, Sunday Active  atorvastatin 20 mg oral tablet 1 tab(s) orally once a day (at bedtime) Active  glimepiride 4 mg oral tablet 1 tab(s) orally 2 times a day Active  Vitamin C 1000 mg oral tablet 1 tab(s) orally once a day Active  Fish Oil 1000 mg oral capsule 1 cap(s) orally once a day (at bedtime) Active   Lab Results: Hepatic:  30-Aug-14 02:09   Bilirubin, Total 0.4  Alkaline Phosphatase  143  SGPT (ALT) 36  SGOT (AST) 28  Total Protein, Serum 7.5  Albumin, Serum 4.3  Routine Chem:  30-Aug-14 02:09   Glucose, Serum  184  BUN 14  Creatinine (comp) 1.08  Sodium, Serum 138  Potassium, Serum 3.8  Chloride, Serum 105  CO2, Serum 23  Calcium (Total), Serum 9.1  Osmolality (calc) 281  eGFR (African American) >60  eGFR (Non-African American) >60 (eGFR values <60m/min/1.73 m2 may be an indication of chronic kidney disease (CKD). Calculated eGFR is useful in patients with stable renal function. The eGFR calculation will not be reliable in acutely ill patients when serum creatinine is changing rapidly. It is not useful in  patients on dialysis. The eGFR calculation may not be applicable to patients at the low and high extremes of body sizes, pregnant women, and vegetarians.)  Anion Gap 10    10:08   Result Comment TROPONIN - RESULTS VERIFIED BY REPEAT TESTING.  - HP TO LOLITA OXJOIT@1058 / 01/21/13  - READ-BACK PROCESS PERFORMED.  Result(s) reported on 21 Jan 2013 at 11:01AM.  Cardiac:  30-Aug-14 02:09   CK, Total 208  CPK-MB, Serum 3.0 (Result(s) reported on 21 Jan 2013 at 02:34AM.)  Troponin I < 0.02 (0.00-0.05 0.05 ng/mL or less: NEGATIVE  Repeat testing in 3-6 hrs  if clinically indicated. >0.05 ng/mL: POTENTIAL  MYOCARDIAL INJURY. Repeat  testing in  3-6 hrs if  clinically indicated. NOTE: An increase or decrease  of 30% or more on serial  testing suggests a  clinically important change)    10:08   CK, Total 129  CPK-MB, Serum 2.9 (Result(s) reported on 21 Jan 2013 at 10:45AM.)  Troponin I  0.15 (0.00-0.05 0.05 ng/mL or less: NEGATIVE  Repeat testing in 3-6 hrs  if clinically indicated. >0.05 ng/mL: POTENTIAL  MYOCARDIAL INJURY. Repeat  testing in 3-6 hrs if  clinically indicated. NOTE: An increase or decrease  of 30% or more on serial  testing suggests a  clinically important change)  Routine UA:  30-Aug-14 04:41   Color (UA) Straw  Clarity (UA) Clear  Glucose (UA) >=500  Bilirubin (UA) Negative  Ketones (UA) Trace  Specific Gravity (UA) 1.005  Blood (UA) Negative  pH (UA) 6.0  Protein (UA) Negative  Nitrite (UA) Negative  Leukocyte Esterase (UA) Negative (Result(s) reported on 21 Jan 2013 at 05:14AM.)  RBC (UA) 1 /HPF  WBC (UA) 1 /HPF  Bacteria (UA) NONE SEEN  Epithelial Cells (UA) NONE SEEN  Mucous (UA) PRESENT (Result(s) reported on 21 Jan 2013 at 05:14AM.)  Routine Coag:  30-Aug-14 02:09   Prothrombin  21.3  INR 1.9 (INR reference interval applies to patients on anticoagulant therapy. A single INR therapeutic range for coumarins is not optimal for all indications; however, the suggested range for most indications is 2.0 - 3.0. Exceptions to the INR Reference Range may include: Prosthetic heart valves, acute myocardial infarction, prevention of myocardial infarction, and combinations of aspirin and anticoagulant. The need for a higher or lower target INR must be assessed individually. Reference: The Pharmacology and Management of the Vitamin K  antagonists: the seventh ACCP Conference on Antithrombotic and Thrombolytic Therapy. MEQAS.3419 Sept:126 (3suppl): N9146842. A HCT value >55% may artifactually increase the PT.  In one study,  the increase was an average of 25%. Reference:  "Effect on Routine and  Special Coagulation Testing Values of Citrate Anticoagulant Adjustment in Patients with High HCT Values." American Journal of Clinical Pathology 2006;126:400-405.)  Routine Hem:  30-Aug-14 02:09   WBC (CBC) 6.6  RBC (CBC) 4.71  Hemoglobin (CBC) 14.4  Hematocrit (CBC) 42.4  Platelet Count (CBC) 186  MCV 90  MCH 30.5  MCHC 33.8  RDW 13.8  Neutrophil % 55.4  Lymphocyte % 28.1  Monocyte % 12.9  Eosinophil % 3.1  Basophil % 0.5  Neutrophil # 3.7  Lymphocyte # 1.9  Monocyte # 0.8  Eosinophil # 0.2  Basophil # 0.0 (Result(s) reported on 21 Jan 2013 at 02:18AM.)   EKG:  Interpretation atrial fibrillation with rapid ventricular response. Significant ST depression in the anterior leads.    No Known Allergies:   Vital Signs/Nurse's Notes: **Vital Signs.:   30-Aug-14 09:00  Vital Signs Type Routine  Pulse Pulse 54  Respirations Respirations 23  Systolic BP Systolic BP 622  Diastolic BP (mmHg) Diastolic BP (mmHg) 35  Mean BP 72  Pulse Ox % Pulse Ox % 96  Pulse Ox Activity Level  At rest  Oxygen Delivery 2L  Pulse Ox Heart Rate 54    Impression 1. Atrial fibrillation with rapid ventricular response: This is his third episode since October. He is usually highly symptomatic during these episodes with significant tachycardia and chest discomfort. Thus, I think it's important to try to maintain sinus rhythm with an antiarrhythmic medication. He has no history of heart failure and thus Multaq is probably a good option. I started this at 400 mg twice daily with meals. I decreased the dose of metoprolol to 12.5 mg twice daily given his resting bradycardia. Continue anticoagulation with warfarin. Other options in the future include dofetilide, amiodarone or catheter ablation.  2. Mildly elevated cardiac enzymes likely due to supply demand ischemia. The patient has no anginal symptoms at baseline unless he goes into A. fib with RVR. continue aspirin daily.  3. Coronary artery disease  status post CABG with stenting of the right coronary artery in October of 2013. The stent was bare metal stent and thus Plavix is not needed.   Electronic Signatures: Kathlyn Sacramento (MD)  (Signed 30-Aug-14 13:46)  Authored: General Aspect/Present Illness, History and Physical Exam, Review of System, Home Medications,  Labs, EKG , Allergies, Vital Signs/Nurse's Notes, Impression/Plan   Last Updated: 30-Aug-14 13:46 by Kathlyn Sacramento (MD)

## 2014-09-14 NOTE — Consult Note (Signed)
Brief Consult Note: Diagnosis: paroxysmal A-fib with RVR, supply demand ischemia with known CAD.   Patient was seen by consultant.   Consult note dictated.   Comments: Due to recurrent episodes of highly symptomatic A-fib with RVR, an antiarrhythmic medication is recommended.  I started Mutaq 400 mg bid and decreased Metoprolol to 12.5 mg bid.  Transfer to tele and monitor overnight.  Continue Warfarin.  Electronic Signatures: Kathlyn Sacramento (MD)  (Signed 30-Aug-14 11:39)  Authored: Brief Consult Note   Last Updated: 30-Aug-14 11:39 by Kathlyn Sacramento (MD)

## 2014-09-14 NOTE — Discharge Summary (Signed)
PATIENT NAME:  Seth Hardy, ROCKETT MR#:  [id number] DATE OF BIRTH:  [birth year]  DATE OF ADMISSION:  01/21/2013 DATE OF DISCHARGE:  01/22/2013  DISCHARGE DIAGNOSES: 1.  Atrial fibrillation with rapid ventricular response. Resolved.  2.  Bradycardia, which was temporary secondary to Cardizem, resolved.  3.  Hypertension. 4.  Coronary artery disease.   5.  Diabetes mellitus type 2.   DISCHARGE MEDICATIONS:   1.  Amlodipine 10 mg p.o. daily.  2.  Ascorbic acid 500 mg p.o. daily.  3.  Aspirin 81 mg daily.  4.  Lantus 20 units day.  5.  Humalog 8 units 2 times a day with meals.  6.  Metformin 500 mg p.o. b.i.d.  7.  Nitroglycerin sublingual p.r.n. for chest pain 0.4 mg.  8.  Omega-3 polyunsaturated fatty acids 1200 mg 1 capsule daily.  9.  Pantoprazole 40 mg p.o. daily.  10.  Ramipril 10 mg p.o. daily.  11.  Coumadin 5 mg on Monday, Wednesday, Friday, Coumadin 2.5 mg on Tuesday, Thursday, Saturday and Sunday.  12.  Amaryl 4 mg p.o. b.i.d.  13.  Fish oil 1 gram p.o. daily.  14.  Simvastatin 40 mg p.o. daily.  15.  Metoprolol 25 mg 1/2 tablet p.o. b.i.d.  16.  The patient was started on Multaq which he is dronedarone 400 mg p.o. b.i.d.   CONSULTATIONS: Cardiology consult with Dr. Fletcher Anon.   HOSPITAL COURSE: The patient is a 69 year old male patient with history of multiple medical problems of hypertension, diabetes, chronic atrial fibrillation who came in because of palpitations and chest pain. Refer to history and physical for full details. [name] heart rate was in 160s. The patient did have previously, 2 weeks ago, the same symptoms. The patient received Cardizem in the Emergency Room about 10 mg 3 doses and heart rate dropped to around the 50s. The patient was admitted to ICU for close monitoring. The patient did not have any palpitations; however, because of his recurrent symptoms, cardiologist . Dr. Fletcher Anon has seen the patient and started him on Multaq.  The patient moved to telemetry.  Initial troponins were negative; however, the following troponin was a little bit up at 0.15, so we kept him overnight and monitor him. The patient did not have any chest pain. His CK and CPK-MB were not elevated and the third troponin came down to 0.12. The patient was symptom free the following day.  Dr. Fletcher Anon recommended that he can be discharged on Multaq but metoprolol dose has been decreased to 12.5 twice daily. He was taking 25 mg p.o. b.i.d., but heart rate has been around 60s so he decreased the dose to 12.5 p.o. b.i.d. [name] other labs were within normal range. The patient was advised to follow up with Dr. Fletcher Anon in 1 week to 2 weeks regarding further dose adjustments.   TIME SPENT ON DISCHARGE PREPARATION: More than 30 minutes.    ____________________________ Epifanio Lesches, MD sk:[id number] D: 01/24/2013 08:27:04 ET T: 01/24/2013 08:42:26 ET JOB#: [id number]  cc: Epifanio Lesches, MD, <Dictator> Muhammad A. Fletcher Anon, MD Epifanio Lesches MD ELECTRONICALLY SIGNED 02/03/2013 22:45

## 2014-09-14 NOTE — H&P (Signed)
PATIENT NAME:  Seth Hardy, Seth Hardy MR#:  [id number] DATE OF BIRTH:  [birth year]  DATE OF ADMISSION:  01/21/2013  REFERRING PHYSICIAN: Valli Glance. Owens Shark, MD  PRIMARY CARE PHYSICIAN: Lavera Guise, MD  PRIMARY CARDIOLOGIST: Used to see Dr. Verl Blalock at [facility], recently transferred to Dr. Rockey Situ at [facility], but he has seen him yet.   CHIEF COMPLAINT: Palpitations with chest pain.   HISTORY OF PRESENT ILLNESS: This is a pleasant 69 year old male with significant past medical history of coronary artery disease, status post CABG before 3 years, stent before 1 year, atrial fibrillation, on warfarin, diabetes mellitus, hypertension, hyperlipidemia, who presents with complaints of palpitations. The patient was found to be in atrial fibrillation with RVR in the ED with a heart rate in the 160s. This is the patient's second presentation in 2 weeks to ED with atrial fibrillation with RVR. The last one was on August 16, which resolved with IV medications, where he was sent home. Today, he comes back with similar presentation. Wife at bedside reports that her husband was a Conservation officer, historic buildings at a football game, and he just finished the game when he started having these symptoms. As well, she relates a lot of his symptoms happening after these games, where before 2 weeks as well, he had his atrial fibrillation with RVR after that game. As well, the patient has been complaining of chest pain, described it as midsternal, radiating to the left arm, pressure-like, very minimal, but he denies any shortness of breath, dizziness, diaphoresis, nausea or vomiting. As well, he reports this chest pain is different than the chest pain when he had his heart attack in the past, resolved with sublingual nitroglycerin. The patient received sublingual nitroglycerin and 324 of aspirin en route by EMS. [name] EKG did show him to be in atrial fibrillation with RVR, with ST depression in the lateral leads. The patient received IV  Cardizem 10 mg x3 for his heart rate to get controlled. At some point, the patient was observed on telemonitor, where his heart rate had dropped, where he does convert to sinus rhythm for around 10 to 15 seconds, where heart rate becomes in the 50s, where the rhythm strip is left in the patient's chart. Hospitalist service was requested to admit the patient for further management and workup of his chest pain and atrial fibrillation.   PAST MEDICAL HISTORY:  1. Diabetes mellitus.  2. Atrial fibrillation, on warfarin.  3. Coronary artery disease, status post CABG before 3 years and stent in October 2013.  4. Hypertension.  5. Hyperlipidemia.   PAST SURGICAL HISTORY: Cardiac bypass at [facility] before 3 years.   FAMILY HISTORY: The patient denies any coronary artery disease at a young age.   SOCIAL HISTORY: The patient denies any smoking, any illicit drug use, any alcohol use.   ALLERGIES: No known drug allergies.   HOME MEDICATIONS:  1. Aspirin 81 mg oral daily.  2. Metoprolol 25 mg p.o. b.i.d.  3. Tylenol as needed.  4. Ramipril 10 mg oral daily.  5. Sublingual nitroglycerin as needed.  6. Warfarin 2.5 mg oral every Tuesday, Thursday, Saturday and Sunday and 5 mg every Monday, Wednesday and Friday.  7. Glyburide 4 mg oral 2 times a day.  8. Humalog 8 units subcutaneous 2 times a day with meals.  9. Lantus 20 units subcutaneous daily.  10. Metformin 500 mg oral 2 times a day.  11. Atorvastatin 20 mg oral daily.  12. Fish oral 1000 mg oral daily.  13. Omega-3 fatty acids 1200 mg oral daily.  14. Protonix 40 mg oral daily.  15. Vitamin C 1 gram oral daily.   REVIEW OF SYSTEMS:  CONSTITUTIONAL: The patient denies any fevers, chills, fatigue, weakness, weight gain, weight loss.  EYES: Denies blurry vision, double vision, inflammation or glaucoma.  ENT: Denies tinnitus, ear pain, epistaxis or discharge.  RESPIRATORY: Denies any cough, wheezing, hemoptysis, painful respiration, COPD or  dyspnea.  CARDIOVASCULAR: Complains of chest pain, currently resolved. Has edema. Denies any syncope or dyspnea on exertion. Complains of palpitation.  GASTROINTESTINAL: Denies nausea, vomiting, diarrhea, abdominal pain, hematemesis, jaundice.  GENITOURINARY: Denies dysuria, hematuria, renal colic.  ENDOCRINE: Denies polyuria, polydipsia, heat or cold intolerance.  HEMATOLOGY: Denies anemia, easy bruising, bleeding diathesis.  INTEGUMENTARY: Denies acne, rash or skin lesions.  MUSCULOSKELETAL: Denies any gout, arthritis or cramps.  NEUROLOGICAL: Denies any CVA, TIA, ataxia, dementia, headache.  PSYCHIATRIC: Denies anxiety, insomnia, bipolar disorder or schizophrenia.   PHYSICAL EXAMINATION:  VITAL SIGNS: Temperature 97.8, pulse 111, respiratory rate 20, blood pressure 156/70, saturating 92% on room air.  GENERAL: Well-nourished male, looks comfortable, in no apparent distress.  HEENT: Head atraumatic, normocephalic. Pupils equally reactive to light. Pink conjunctivae. Anicteric sclerae. Moist oral mucosa.  NECK: Supple. No thyromegaly. No JVD. No carotid bruits.  CHEST: Good air entry bilaterally. No wheezing, rales or rhonchi.  CARDIOVASCULAR: S1, S2 heard. No rubs, murmurs or gallops. Irregularly irregular, tachycardic.  ABDOMEN: Obese, soft, nontender, nondistended. Bowel sounds present.  EXTREMITIES: +1 edema bilaterally. Dorsalis pedis pulse +2 bilaterally. No clubbing. No cyanosis.  SKIN: Normal skin turgor. Warm and dry.  NEUROLOGIC: Cranial nerves grossly intact. Motor 5 out of 5. No focal deficits.  LYMPHATICS: No cervical or supraclavicular lymphadenopathy.   PERTINENT LABORATORY DATA: Glucose 184, BUN 14, creatinine 1.08, sodium 138, potassium 3.8, chloride 105, CO2 23, ALT 36, AST 28, alkaline phosphatase 143, troponin less than 0.02. White blood cells 6.6, hemoglobin 14.4, hematocrit 42.4, platelets 186. INR 1.9. EKG showing atrial fibrillation with RVR at 148 beats per minute,  which is significant for ST depression in lateral leads.   ASSESSMENT AND PLAN:  1. Atrial fibrillation with rapid ventricular response. The patient is known to have history of atrial fibrillation, and he is on anticoagulation on warfarin, and INR is 1.9. [name] rate is currently controlled, but it still seems to be variable. At one point, he goes tachycardic at 110, and then he goes bradycardic at 50s, where he converts to normal sinus rhythm, so unclear if there is as well sick sinus syndrome, unclear at this point. The patient will be continued on p.o. metoprolol. Will have him on telemetry monitor. Will continue to cycle his cardiac enzymes and follow the trend. Reports he had recent echo in the spring, so at this point, will not repeat his echo. Unfortunately, we do not have access to [place] records to review the results of the echo. Will consult pharmacy to dose his warfarin. Will consult cardiology to see if there is any further recommendation regarding his home medications. As well, the patient was instructed to avoid caffeine as well and to avoid exerting himself with excessive physical activity, and he was instructed to continue with exercise within tolerance.    ( addendum: patient had to episodes of pauses on tele, so will admit to CCU, and hold metoprolol and have transcutaneous pacer at bedside).  2. Chest pain, currently chest pain-free. This is most likely related to his atrial fibrillation with RVR. First troponin  is negative; will continue to cycle his cardiac enzymes. Already received nitroglycerin and aspirin, and he is already on beta blockers.  3. Coronary artery disease. The patient appears to be on optimal medication. He is on aspirin, Lipitor/statin, beta blockers and ACE inhibitor.  4. Diabetes mellitus. Will resume the patient on Lantus at a lower dose, will decrease from 20 to 12 units subcutaneous daily, and will have him on insulin sliding scale. Hold oral hypoglycemic  agents.  5. Hypertension, acceptable. Continue with home medications.  6. Hyperlipidemia. Continue with statin, fish oil and fatty acids.  7. Deep vein thrombosis prophylaxis. The patient is on full-dose anticoagulation with warfarin for his atrial fibrillation.   CODE STATUS: The patient is full code.   TOTAL TIME SPENT ON ADMISSION AND PATIENT CARE: 55 minutes.    ____________________________ Albertine Patricia, MD dse:OSi D: 01/21/2013 04:51:32 ET T: 01/21/2013 05:40:58 ET JOB#: [id number]  cc: Albertine Patricia, MD, <Dictator> Jaid Quirion Graciela Husbands MD ELECTRONICALLY SIGNED 01/21/2013 7:16

## 2014-09-21 ENCOUNTER — Telehealth: Payer: Self-pay | Admitting: *Deleted

## 2014-09-21 ENCOUNTER — Other Ambulatory Visit: Payer: Self-pay | Admitting: *Deleted

## 2014-09-21 DIAGNOSIS — I4891 Unspecified atrial fibrillation: Secondary | ICD-10-CM

## 2014-09-21 MED ORDER — AMIODARONE HCL 200 MG PO TABS: 100.0000 mg | ORAL_TABLET | Freq: Every day | ORAL | Status: DC

## 2014-09-21 NOTE — Telephone Encounter (Signed)
°  1. Which medications need to be refilled? Amiodarone 100 mg   2. Which pharmacy is medication to be sent to? [facility]   3. Do they need a 30 day or 90 day supply? 30   4. Would they like a call back once the medication has been sent to the pharmacy? No we need to fax it [phone]

## 2014-09-24 ENCOUNTER — Other Ambulatory Visit: Payer: Self-pay | Admitting: *Deleted

## 2014-09-24 DIAGNOSIS — I4891 Unspecified atrial fibrillation: Secondary | ICD-10-CM

## 2014-09-24 MED ORDER — AMIODARONE HCL 200 MG PO TABS: 100.0000 mg | ORAL_TABLET | Freq: Every day | ORAL | Status: DC

## 2014-09-24 NOTE — Telephone Encounter (Signed)
[facility] called stating that patient needs a 90 day rx sent in  without refills. Sent in to [phone]

## 2014-09-24 NOTE — Telephone Encounter (Signed)
Rx faxed to [facility].

## 2014-10-09 ENCOUNTER — Telehealth: Payer: Self-pay | Admitting: *Deleted

## 2014-10-09 NOTE — Telephone Encounter (Signed)
phrmacy calling stating pt is asking for 90 day supply on: 1.Atorvastatin 2. Warfin 3. Amiodarone  Please send it new RX for pt needs it soon.

## 2014-10-10 ENCOUNTER — Other Ambulatory Visit: Payer: Self-pay | Admitting: *Deleted

## 2014-10-10 DIAGNOSIS — I4891 Unspecified atrial fibrillation: Secondary | ICD-10-CM

## 2014-10-10 MED ORDER — AMIODARONE HCL 200 MG PO TABS: 100.0000 mg | ORAL_TABLET | Freq: Every day | ORAL | Status: DC

## 2014-10-10 MED ORDER — WARFARIN SODIUM 5 MG PO TABS: ORAL_TABLET | ORAL | Status: DC

## 2014-10-10 MED ORDER — ATORVASTATIN CALCIUM 20 MG PO TABS: 20.0000 mg | ORAL_TABLET | Freq: Every day | ORAL | Status: DC

## 2014-10-10 NOTE — Telephone Encounter (Signed)
Rx sent for Amiodarone and atorvastatin 90 day supply to preferred pharmacy.  Pt requesting refill on Warfarin please review for refill, Thank you.

## 2014-10-10 NOTE — Telephone Encounter (Signed)
90 day rx for Warfarin refill sent to pharmacy as requested.

## 2014-10-12 ENCOUNTER — Other Ambulatory Visit: Payer: Self-pay

## 2014-10-12 DIAGNOSIS — I4891 Unspecified atrial fibrillation: Secondary | ICD-10-CM

## 2014-10-12 MED ORDER — ATORVASTATIN CALCIUM 20 MG PO TABS: 20.0000 mg | ORAL_TABLET | Freq: Every day | ORAL | Status: DC

## 2014-10-12 MED ORDER — AMIODARONE HCL 200 MG PO TABS: 100.0000 mg | ORAL_TABLET | Freq: Every day | ORAL | Status: DC

## 2014-10-12 NOTE — Telephone Encounter (Signed)
[facility]ill sent to [facility] to have on file for amiodarone and atorvastatin.

## 2014-10-16 ENCOUNTER — Other Ambulatory Visit: Payer: Self-pay | Admitting: Surgery

## 2014-10-17 ENCOUNTER — Ambulatory Visit (INDEPENDENT_AMBULATORY_CARE_PROVIDER_SITE_OTHER): Payer: PPO | Admitting: *Deleted

## 2014-10-17 DIAGNOSIS — Z7901 Long term (current) use of anticoagulants: Secondary | ICD-10-CM

## 2014-10-17 DIAGNOSIS — I4891 Unspecified atrial fibrillation: Secondary | ICD-10-CM | POA: Diagnosis not present

## 2014-10-17 LAB — POCT INR: INR: 1.8

## 2014-11-14 ENCOUNTER — Ambulatory Visit (INDEPENDENT_AMBULATORY_CARE_PROVIDER_SITE_OTHER): Payer: PPO

## 2014-11-14 DIAGNOSIS — Z7901 Long term (current) use of anticoagulants: Secondary | ICD-10-CM | POA: Diagnosis not present

## 2014-11-14 DIAGNOSIS — I4891 Unspecified atrial fibrillation: Secondary | ICD-10-CM

## 2014-11-14 DIAGNOSIS — E785 Hyperlipidemia, unspecified: Secondary | ICD-10-CM

## 2014-11-14 LAB — POCT INR: INR: 1.7

## 2014-11-19 ENCOUNTER — Other Ambulatory Visit: Payer: Self-pay

## 2014-12-05 ENCOUNTER — Ambulatory Visit (INDEPENDENT_AMBULATORY_CARE_PROVIDER_SITE_OTHER): Payer: PPO

## 2014-12-05 DIAGNOSIS — I4891 Unspecified atrial fibrillation: Secondary | ICD-10-CM

## 2014-12-05 DIAGNOSIS — Z7901 Long term (current) use of anticoagulants: Secondary | ICD-10-CM | POA: Diagnosis not present

## 2014-12-05 LAB — POCT INR: INR: 2.3

## 2015-01-02 ENCOUNTER — Ambulatory Visit (INDEPENDENT_AMBULATORY_CARE_PROVIDER_SITE_OTHER): Payer: PPO | Admitting: *Deleted

## 2015-01-02 DIAGNOSIS — Z7901 Long term (current) use of anticoagulants: Secondary | ICD-10-CM

## 2015-01-02 DIAGNOSIS — I4891 Unspecified atrial fibrillation: Secondary | ICD-10-CM

## 2015-01-02 LAB — POCT INR: INR: 2

## 2015-01-30 ENCOUNTER — Ambulatory Visit (INDEPENDENT_AMBULATORY_CARE_PROVIDER_SITE_OTHER): Payer: PPO

## 2015-01-30 DIAGNOSIS — Z7901 Long term (current) use of anticoagulants: Secondary | ICD-10-CM | POA: Diagnosis not present

## 2015-01-30 DIAGNOSIS — I4891 Unspecified atrial fibrillation: Secondary | ICD-10-CM | POA: Diagnosis not present

## 2015-01-30 LAB — POCT INR: INR: 2.4

## 2015-02-25 ENCOUNTER — Encounter: Payer: Self-pay | Admitting: Internal Medicine

## 2015-03-14 ENCOUNTER — Ambulatory Visit (INDEPENDENT_AMBULATORY_CARE_PROVIDER_SITE_OTHER): Payer: PPO

## 2015-03-14 ENCOUNTER — Encounter: Payer: Self-pay | Admitting: Cardiovascular Disease

## 2015-03-14 ENCOUNTER — Ambulatory Visit (INDEPENDENT_AMBULATORY_CARE_PROVIDER_SITE_OTHER): Payer: PPO | Admitting: Cardiovascular Disease

## 2015-03-14 VITALS — BP 150/70 | HR 52 | Ht 68.0 in | Wt 191.8 lb

## 2015-03-14 DIAGNOSIS — I25708 Atherosclerosis of coronary artery bypass graft(s), unspecified, with other forms of angina pectoris: Secondary | ICD-10-CM | POA: Diagnosis not present

## 2015-03-14 DIAGNOSIS — I6523 Occlusion and stenosis of bilateral carotid arteries: Secondary | ICD-10-CM

## 2015-03-14 DIAGNOSIS — I4891 Unspecified atrial fibrillation: Secondary | ICD-10-CM | POA: Diagnosis not present

## 2015-03-14 DIAGNOSIS — Z7901 Long term (current) use of anticoagulants: Secondary | ICD-10-CM

## 2015-03-14 DIAGNOSIS — E785 Hyperlipidemia, unspecified: Secondary | ICD-10-CM

## 2015-03-14 DIAGNOSIS — I350 Nonrheumatic aortic (valve) stenosis: Secondary | ICD-10-CM | POA: Diagnosis not present

## 2015-03-14 LAB — POCT INR: INR: 1.8

## 2015-03-14 MED ORDER — NITROGLYCERIN 0.4 MG SL SUBL: 0.4000 mg | SUBLINGUAL_TABLET | SUBLINGUAL | Status: DC | PRN

## 2015-03-14 NOTE — Assessment & Plan Note (Signed)
This was mild on most recent echocardiogram from last year.

## 2015-03-14 NOTE — Assessment & Plan Note (Signed)
He is maintaining in sinus rhythm on small dose amiodarone. He had labs done with his primary care physician in September which were overall unremarkable. We discussed NOACs for anticoagulation but he has not been able to afford branded medications before due to cost reasons. Continue with warfarin for now.

## 2015-03-14 NOTE — Assessment & Plan Note (Signed)
Moderate bilateral carotid stenosis worse on the left side. Repeated carotid Doppler in 6 months.

## 2015-03-14 NOTE — Assessment & Plan Note (Signed)
He has no symptoms of angina. Continue medical therapy. 

## 2015-03-14 NOTE — Patient Instructions (Signed)
Medication Instructions:  Your physician recommends that you continue on your current medications as directed. Please refer to the Current Medication list given to you today.   Labwork: none  Testing/Procedures: Your physician has requested that you have a carotid duplex in six months. This test is an ultrasound of the carotid arteries in your neck. It looks at blood flow through these arteries that supply the brain with blood. Allow one hour for this exam. There are no restrictions or special instructions.    Follow-Up: Your physician wants you to follow-up in: six months with Dr. Fletcher Anon.  You will receive a reminder letter in the mail two months in advance. If you don't receive a letter, please call our office to schedule the follow-up appointment.   Any Other Special Instructions Will Be Listed Below (If Applicable).

## 2015-03-14 NOTE — Progress Notes (Signed)
HPI  [name] is a 69 -year-old man who is here today for a followup visit. He has known history of coronary artery disease status post CABG,  history of a non-STEMI in the setting of paroxysmal atrial fibrillation with a rapid ventricular rate in October of 2013, status post bare-metal stent to right coronary artery, mild aortic stenosis, anticoagulation therapy, hypertension, hyperlipidemia, type 2 diabetes, bilateral moderate carotid disease, and mild aortic stenosis.   He was placed on Multaq due to recurrent symptomatic episodes of atrial fibrillation causing coronary ischemia. He had no recurrent episodes of A. fib or palpitations on Multaq. However, he could not afford this medication.He was thus switched to amiodarone. The dose was decreased during last visit to 100 mg once daily. He continues to do well and denies any chest pain, shortness of breath or palpitations.  No Known Allergies   Current Outpatient Prescriptions on File Prior to Visit  Medication Sig Dispense Refill  . acetaminophen (TYLENOL) 325 MG tablet Take 650 mg by mouth every 6 (six) hours as needed. For pain    . amiodarone (PACERONE) 200 MG tablet Take 0.5 tablets (100 mg total) by mouth daily. 45 tablet 3  . amLODipine (NORVASC) 5 MG tablet Take one tablet by mouth one time daily 90 tablet 3  . aspirin 81 MG EC tablet Take 81 mg by mouth daily.      Marland Kitchen atorvastatin (LIPITOR) 20 MG tablet Take 1 tablet (20 mg total) by mouth at bedtime. 90 tablet 3  . furosemide (LASIX) 20 MG tablet Take 1 tablet (20 mg total) by mouth as needed. 30 tablet 6  . glimepiride (AMARYL) 4 MG tablet Take 4 mg by mouth 2 (two) times daily.      . insulin glargine (LANTUS) 100 UNIT/ML injection Inject 20 Units into the skin daily.    . insulin lispro (HUMALOG) 100 UNIT/ML injection Inject 8 Units into the skin 2 (two) times daily with a meal.    . metFORMIN (GLUCOPHAGE) 500 MG tablet Take 500 mg by mouth 2 (two) times daily with a meal.     . Omega-3 Fatty Acids (FISH OIL) 1200 MG CAPS Take 1 capsule by mouth daily.      . pantoprazole (PROTONIX) 40 MG tablet Take 1 tablet (40 mg total) by mouth daily. 90 tablet 3  . ramipril (ALTACE) 10 MG capsule Take 1 capsule (10 mg total) by mouth daily. 90 capsule 5  . vitamin C (ASCORBIC ACID) 500 MG tablet Take 500 mg by mouth daily.      Marland Kitchen warfarin (COUMADIN) 5 MG tablet Take as directed by anticoagulation clinic 90 tablet 1   No current facility-administered medications on file prior to visit.     Past Medical History  Diagnosis Date  . HTN (hypertension)   . Hyperlipidemia   . Diabetes mellitus type II, controlled ([place])     a. Variable CBG 02/2012 - several meds adjusted.  . Mild aortic stenosis     a. 03/2011 Echo: EF 55-60%, Mild AS. b. Mild by cath 02/2012.  . Carotid disease, bilateral ([place])     a. 03/2011 U/S: 40-59% bilat Carotid dzs  . Obesity   . GERD (gastroesophageal reflux disease)   . PAF (paroxysmal atrial fibrillation) ([place])     a. Newly dx 02/2012 & initiated on Coumadin (spont converted to NSR).  Marland Kitchen CAD (coronary artery disease)     a. 01/2010 CABG x 4: LIMA->LAD, VG->D1, VG->OM1, VG->PDA. b. NSTEMI 02/2012  in setting of AF-RVR with cath s/p BMS to RCA (plan for 1 month, possibly up to 3 months of Plavix)  . Sleep apnea      Past Surgical History  Procedure Laterality Date  . Cyst l kidney    . Angioplasty    . Coronary artery bypass graft    . Cardiac catheterization    . Eye surgery    . Hernia repair    . Left heart catheterization with coronary angiogram N/A 03/09/2012    Procedure: LEFT HEART CATHETERIZATION WITH CORONARY ANGIOGRAM;  Surgeon: Wellington Hampshire, MD;  Location: [facility] CATH LAB;  Service: Cardiovascular;  Laterality: N/A;  . Percutaneous coronary stent intervention (pci-s)  03/09/2012    Procedure: PERCUTANEOUS CORONARY STENT INTERVENTION (PCI-S);  Surgeon: Wellington Hampshire, MD;  Location: [facility] CATH LAB;  Service: Cardiovascular;;      Family History  Problem Relation Age of Onset  . COPD Mother     alive 33  . Other Father     alive 69     Social History   Social History  . Marital Status: Married    Spouse Name: N/A  . Number of Children: N/A  . Years of Education: N/A   Occupational History  . Not on file.   Social History Main Topics  . Smoking status: Never Smoker   . Smokeless tobacco: Not on file     Comment: tobacco use - no  . Alcohol Use: No     Comment: rare drink  . Drug Use: No  . Sexual Activity: Yes   Other Topics Concern  . Not on file   Social History Narrative   Full time. Gets regular exercise. Lives in [place] with his wife.  Retired HS football/basketball official.      PHYSICAL EXAM   BP 150/70 mmHg  Pulse 52  Ht 5\' 8"  (1.727 m)  Wt 191 lb 12 oz (86.977 kg)  BMI 29.16 kg/m2 Constitutional: He is oriented to person, place, and time. He appears well-developed and well-nourished. No distress.  HENT: No nasal discharge.  Head: Normocephalic and atraumatic.  Eyes: Pupils are equal and round. Right eye exhibits no discharge. Left eye exhibits no discharge.  Neck: Normal range of motion. Neck supple. No JVD present. No thyromegaly present.  Cardiovascular: Bradycardic, regular rhythm, normal heart sounds and. Exam reveals no gallop and no friction rub. No murmur heard.  There is a 2/6 systolic ejection murmur at the aortic area which is early peaking Pulmonary/Chest: Effort normal and breath sounds normal. No stridor. No respiratory distress. He has no wheezes. He has no rales. He exhibits no tenderness.  Abdominal: Soft. Bowel sounds are normal. He exhibits no distension. There is no tenderness. There is no rebound and no guarding.  Musculoskeletal: Normal range of motion. He exhibits trace edema and no tenderness.  Neurological: He is alert and oriented to person, place, and time. Coordination normal.  Skin: Skin is warm and dry. No rash noted. He is not diaphoretic. No  erythema. No pallor.  Psychiatric: He has a normal mood and affect. His behavior is normal. Judgment and thought content normal.       EKG: Sinus  Bradycardia  -Nonspecific ST depression   +   T-abnormality  -Nondiagnostic  -Possible  Anterior/lateral  ischemia.   ABNORMAL     ASSESSMENT AND PLAN

## 2015-03-14 NOTE — Assessment & Plan Note (Signed)
Managed by primary care physician. Most recent lipid profile showed a total cholesterol of 120, triglyceride of 113, HDL of 28 and LDL of 69. Continue current dose of atorvastatin.

## 2015-04-10 ENCOUNTER — Ambulatory Visit (INDEPENDENT_AMBULATORY_CARE_PROVIDER_SITE_OTHER): Payer: PPO

## 2015-04-10 DIAGNOSIS — Z7901 Long term (current) use of anticoagulants: Secondary | ICD-10-CM | POA: Diagnosis not present

## 2015-04-10 DIAGNOSIS — I4891 Unspecified atrial fibrillation: Secondary | ICD-10-CM

## 2015-04-10 LAB — POCT INR: INR: 1.8

## 2015-04-16 ENCOUNTER — Other Ambulatory Visit: Payer: Self-pay

## 2015-04-16 MED ORDER — FUROSEMIDE 20 MG PO TABS: 20.0000 mg | ORAL_TABLET | ORAL | Status: DC | PRN

## 2015-04-26 ENCOUNTER — Ambulatory Visit: Payer: PPO

## 2015-04-26 DIAGNOSIS — I25708 Atherosclerosis of coronary artery bypass graft(s), unspecified, with other forms of angina pectoris: Secondary | ICD-10-CM

## 2015-05-03 ENCOUNTER — Other Ambulatory Visit: Payer: Self-pay

## 2015-05-03 DIAGNOSIS — I6523 Occlusion and stenosis of bilateral carotid arteries: Secondary | ICD-10-CM

## 2015-05-08 ENCOUNTER — Ambulatory Visit (INDEPENDENT_AMBULATORY_CARE_PROVIDER_SITE_OTHER): Payer: PPO

## 2015-05-08 DIAGNOSIS — Z7901 Long term (current) use of anticoagulants: Secondary | ICD-10-CM

## 2015-05-08 DIAGNOSIS — I4891 Unspecified atrial fibrillation: Secondary | ICD-10-CM | POA: Diagnosis not present

## 2015-05-08 LAB — POCT INR: INR: 2

## 2015-06-11 DIAGNOSIS — E113513 Type 2 diabetes mellitus with proliferative diabetic retinopathy with macular edema, bilateral: Secondary | ICD-10-CM | POA: Diagnosis not present

## 2015-06-19 ENCOUNTER — Ambulatory Visit (INDEPENDENT_AMBULATORY_CARE_PROVIDER_SITE_OTHER): Payer: PPO

## 2015-06-19 DIAGNOSIS — I4891 Unspecified atrial fibrillation: Secondary | ICD-10-CM | POA: Diagnosis not present

## 2015-06-19 DIAGNOSIS — Z7901 Long term (current) use of anticoagulants: Secondary | ICD-10-CM | POA: Diagnosis not present

## 2015-06-19 LAB — POCT INR: INR: 2.2

## 2015-06-21 DIAGNOSIS — Z794 Long term (current) use of insulin: Secondary | ICD-10-CM | POA: Diagnosis not present

## 2015-06-21 DIAGNOSIS — I350 Nonrheumatic aortic (valve) stenosis: Secondary | ICD-10-CM | POA: Diagnosis not present

## 2015-06-21 DIAGNOSIS — R55 Syncope and collapse: Secondary | ICD-10-CM | POA: Diagnosis not present

## 2015-06-21 DIAGNOSIS — R42 Dizziness and giddiness: Secondary | ICD-10-CM | POA: Diagnosis not present

## 2015-06-21 DIAGNOSIS — E1165 Type 2 diabetes mellitus with hyperglycemia: Secondary | ICD-10-CM | POA: Diagnosis not present

## 2015-06-21 DIAGNOSIS — E113293 Type 2 diabetes mellitus with mild nonproliferative diabetic retinopathy without macular edema, bilateral: Secondary | ICD-10-CM | POA: Diagnosis not present

## 2015-06-23 DIAGNOSIS — G4733 Obstructive sleep apnea (adult) (pediatric): Secondary | ICD-10-CM | POA: Diagnosis not present

## 2015-06-23 DIAGNOSIS — R0689 Other abnormalities of breathing: Secondary | ICD-10-CM | POA: Diagnosis not present

## 2015-06-23 DIAGNOSIS — I2510 Atherosclerotic heart disease of native coronary artery without angina pectoris: Secondary | ICD-10-CM | POA: Diagnosis not present

## 2015-06-23 DIAGNOSIS — R011 Cardiac murmur, unspecified: Secondary | ICD-10-CM | POA: Diagnosis not present

## 2015-06-25 ENCOUNTER — Emergency Department
Admission: EM | Admit: 2015-06-25 | Discharge: 2015-06-25 | Disposition: A | Discharge status: Home / Self Care | Payer: PPO | Attending: Emergency Medicine | Admitting: Emergency Medicine

## 2015-06-25 ENCOUNTER — Emergency Department: Payer: PPO

## 2015-06-25 ENCOUNTER — Telehealth: Payer: Self-pay | Admitting: Cardiovascular Disease

## 2015-06-25 ENCOUNTER — Encounter: Payer: Self-pay | Admitting: Emergency Medicine

## 2015-06-25 DIAGNOSIS — I10 Essential (primary) hypertension: Secondary | ICD-10-CM | POA: Diagnosis not present

## 2015-06-25 DIAGNOSIS — G459 Transient cerebral ischemic attack, unspecified: Secondary | ICD-10-CM | POA: Diagnosis not present

## 2015-06-25 DIAGNOSIS — R42 Dizziness and giddiness: Secondary | ICD-10-CM | POA: Diagnosis not present

## 2015-06-25 DIAGNOSIS — R200 Anesthesia of skin: Secondary | ICD-10-CM | POA: Diagnosis not present

## 2015-06-25 DIAGNOSIS — H538 Other visual disturbances: Secondary | ICD-10-CM | POA: Insufficient documentation

## 2015-06-25 DIAGNOSIS — E119 Type 2 diabetes mellitus without complications: Secondary | ICD-10-CM | POA: Insufficient documentation

## 2015-06-25 DIAGNOSIS — I6523 Occlusion and stenosis of bilateral carotid arteries: Secondary | ICD-10-CM | POA: Diagnosis not present

## 2015-06-25 DIAGNOSIS — R4781 Slurred speech: Secondary | ICD-10-CM | POA: Diagnosis not present

## 2015-06-25 LAB — CBC
HCT: 30.9 % — ABNORMAL LOW (ref 40.0–52.0)
Hemoglobin: 9.6 g/dL — ABNORMAL LOW (ref 13.0–18.0)
MCH: 23 pg — ABNORMAL LOW (ref 26.0–34.0)
MCHC: 30.9 g/dL — ABNORMAL LOW (ref 32.0–36.0)
MCV: 74.3 fL — ABNORMAL LOW (ref 80.0–100.0)
Platelets: 228 10*3/uL (ref 150–440)
RBC: 4.17 MIL/uL — ABNORMAL LOW (ref 4.40–5.90)
RDW: 15.2 % — ABNORMAL HIGH (ref 11.5–14.5)
WBC: 4 10*3/uL (ref 3.8–10.6)

## 2015-06-25 LAB — COMPREHENSIVE METABOLIC PANEL
ALT: 33 U/L (ref 17–63)
AST: 30 U/L (ref 15–41)
Albumin: 4.1 g/dL (ref 3.5–5.0)
Alkaline Phosphatase: 104 U/L (ref 38–126)
Anion gap: 9 (ref 5–15)
BUN: 17 mg/dL (ref 6–20)
CO2: 24 mmol/L (ref 22–32)
Calcium: 9.1 mg/dL (ref 8.9–10.3)
Chloride: 107 mmol/L (ref 101–111)
Creatinine, Ser: 0.99 mg/dL (ref 0.61–1.24)
GFR calc Af Amer: >60 mL/min (ref >60)
GFR calc non Af Amer: >60 mL/min (ref >60)
Glucose, Bld: 119 mg/dL — ABNORMAL HIGH (ref 65–99)
Potassium: 4.3 mmol/L (ref 3.5–5.1)
Sodium: 140 mmol/L (ref 135–145)
Total Bilirubin: 0.4 mg/dL (ref 0.3–1.2)
Total Protein: 7.2 g/dL (ref 6.5–8.1)

## 2015-06-25 LAB — TROPONIN I: Troponin I: <0.03 ng/mL (ref <0.031)

## 2015-06-25 LAB — DIFFERENTIAL
Basophils Absolute: 0 10*3/uL (ref 0–0.1)
Basophils Relative: 1 %
Eosinophils Absolute: 0.1 10*3/uL (ref 0–0.7)
Eosinophils Relative: 2 %
Lymphocytes Relative: 17 %
Lymphs Abs: 0.7 10*3/uL — ABNORMAL LOW (ref 1.0–3.6)
Monocytes Absolute: 0.6 10*3/uL (ref 0.2–1.0)
Monocytes Relative: 15 %
Neutro Abs: 2.6 10*3/uL (ref 1.4–6.5)
Neutrophils Relative %: 65 %

## 2015-06-25 LAB — APTT: aPTT: 35 seconds (ref 24–36)

## 2015-06-25 LAB — PROTIME-INR
INR: 2.63
Prothrombin Time: 27.7 seconds — ABNORMAL HIGH (ref 11.4–15.0)

## 2015-06-25 LAB — GLUCOSE, CAPILLARY: Glucose-Capillary: 120 mg/dL — ABNORMAL HIGH (ref 65–99)

## 2015-06-25 MED ORDER — IOHEXOL 350 MG/ML SOLN
75.0000 mL | Freq: Once | INTRAVENOUS | Status: AC | PRN
  Administered 2015-06-25: 75 mL via INTRAVENOUS

## 2015-06-25 NOTE — Progress Notes (Signed)
Provided Pastoral Care and prayer for patient and family.

## 2015-06-25 NOTE — Telephone Encounter (Signed)
Patient in [facility] ed and is being referred to vascular specialist.  Please call patient with last carotid u/s results.  Patient declined to make an ed fu with [name] at this time.  Wants to leave appt as scheduled in April.      Please call cell.

## 2015-06-25 NOTE — ED Notes (Signed)
Says he had sudden onset numbness of tongue, dizziness and visual changes that lasted about 10 minutes at just prior to 9am today.

## 2015-06-25 NOTE — Consult Note (Signed)
Referring Physician: Jimmye Norman    Chief Complaint: Dizziness, tongue numbness, blurred vision  HPI: Seth Hardy is an 70 y.o. male who reports awakening normal today.  Went to [employer] and had the acute onset of dizziness, blurred vision and tongue numbness.  Patent went home at that time and notified his wife that he was not feeling well.  Patient was brought to the ED at that time.  Symptoms began to resolve and at the time of evaluation had almost resolved completely.  Now completely resolved.  Patient on Coumadin for afib.  Last INR of 1/25 was 2.2.  Patient also reports a recent echocardiogram. Initial NIHSS of 0.    Date last known well: 06/25/2015 Time last known well: Time: 09:00 tPA Given: No: Resolution of symptoms  MRankin: 0  Past Medical History  Diagnosis Date  . HTN (hypertension)   . Hyperlipidemia   . Diabetes mellitus type II, controlled ([place])     a. Variable CBG 02/2012 - several meds adjusted.  . Mild aortic stenosis     a. 03/2011 Echo: EF 55-60%, Mild AS. b. Mild by cath 02/2012.  . Carotid disease, bilateral ([place])     a. 03/2011 U/S: 40-59% bilat Carotid dzs  . Obesity   . GERD (gastroesophageal reflux disease)   . PAF (paroxysmal atrial fibrillation) ([place])     a. Newly dx 02/2012 & initiated on Coumadin (spont converted to NSR).  Marland Kitchen CAD (coronary artery disease)     a. 01/2010 CABG x 4: LIMA->LAD, VG->D1, VG->OM1, VG->PDA. b. NSTEMI 02/2012 in setting of AF-RVR with cath s/p BMS to RCA (plan for 1 month, possibly up to 3 months of Plavix)  . Sleep apnea     Past Surgical History  Procedure Laterality Date  . Cyst l kidney    . Angioplasty    . Coronary artery bypass graft    . Cardiac catheterization    . Eye surgery    . Hernia repair    . Left heart catheterization with coronary angiogram N/A 03/09/2012    Procedure: LEFT HEART CATHETERIZATION WITH CORONARY ANGIOGRAM;  Surgeon: Wellington Hampshire, MD;  Location: [facility] CATH LAB;  Service:  Cardiovascular;  Laterality: N/A;  . Percutaneous coronary stent intervention (pci-s)  03/09/2012    Procedure: PERCUTANEOUS CORONARY STENT INTERVENTION (PCI-S);  Surgeon: Wellington Hampshire, MD;  Location: [facility] CATH LAB;  Service: Cardiovascular;;    Family History  Problem Relation Age of Onset  . COPD Mother     alive 21  . Other Father     alive 65   Social History:  reports that he has never smoked. He does not have any smokeless tobacco history on file. He reports that he does not drink alcohol or use illicit drugs.  Allergies: No Known Allergies  Medications: I have reviewed the patient's current medications. Prior to Admission:  Prior to Admission medications   Medication Sig Start Date End Date Taking? Authorizing Provider  acetaminophen (TYLENOL) 325 MG tablet Take 650 mg by mouth every 6 (six) hours as needed. For pain   Yes Historical Provider, MD  amiodarone (PACERONE) 200 MG tablet Take 0.5 tablets (100 mg total) by mouth daily. 10/12/14  Yes Wellington Hampshire, MD  amLODipine (NORVASC) 5 MG tablet Take one tablet by mouth one time daily 07/25/14  Yes Wellington Hampshire, MD  aspirin 81 MG EC tablet Take 81 mg by mouth daily.     Yes Historical Provider, MD  atorvastatin (LIPITOR) 20  MG tablet Take 1 tablet (20 mg total) by mouth at bedtime. 10/12/14  Yes Wellington Hampshire, MD  furosemide (LASIX) 20 MG tablet Take 1 tablet (20 mg total) by mouth as needed. 04/16/15  Yes Wellington Hampshire, MD  glimepiride (AMARYL) 4 MG tablet Take 4 mg by mouth 2 (two) times daily.     Yes Historical Provider, MD  insulin glargine (LANTUS) 100 UNIT/ML injection Inject 20 Units into the skin daily.   Yes Historical Provider, MD  insulin lispro (HUMALOG) 100 UNIT/ML injection Inject 8 Units into the skin 2 (two) times daily with a meal. 03/10/12  Yes Dayna N Dunn, PA-C  metFORMIN (GLUCOPHAGE) 500 MG tablet Take 500 mg by mouth 2 (two) times daily with a meal.   Yes Historical Provider, MD  Omega-3 Fatty  Acids (FISH OIL) 1200 MG CAPS Take 1 capsule by mouth daily.     Yes Historical Provider, MD  pantoprazole (PROTONIX) 40 MG tablet Take 1 tablet (40 mg total) by mouth daily. 06/11/14  Yes Wellington Hampshire, MD  ramipril (ALTACE) 10 MG capsule Take 1 capsule (10 mg total) by mouth daily. 06/11/14  Yes Wellington Hampshire, MD  vitamin C (ASCORBIC ACID) 500 MG tablet Take 500 mg by mouth daily.     Yes Historical Provider, MD  nitroGLYCERIN (NITROSTAT) 0.4 MG SL tablet Place 1 tablet (0.4 mg total) under the tongue every 5 (five) minutes as needed (up to 3 doses). 03/14/15   Wellington Hampshire, MD  warfarin (COUMADIN) 5 MG tablet Take as directed by anticoagulation clinic 10/10/14   Wellington Hampshire, MD    ROS: History obtained from the patient  General ROS: negative for - chills, fatigue, fever, night sweats, weight gain or weight loss Psychological ROS: negative for - behavioral disorder, hallucinations, memory difficulties, mood swings or suicidal ideation Ophthalmic ROS: as noted in HPI ENT ROS: as noted in HPI Allergy and Immunology ROS: negative for - hives or itchy/watery eyes Hematological and Lymphatic ROS: negative for - bleeding problems, bruising or swollen lymph nodes Endocrine ROS: negative for - galactorrhea, hair pattern changes, polydipsia/polyuria or temperature intolerance Respiratory ROS: negative for - cough, hemoptysis, shortness of breath or wheezing Cardiovascular ROS: negative for - chest pain, dyspnea on exertion, edema or irregular heartbeat Gastrointestinal ROS: negative for - abdominal pain, diarrhea, hematemesis, nausea/vomiting or stool incontinence Genito-Urinary ROS: negative for - dysuria, hematuria, incontinence or urinary frequency/urgency Musculoskeletal ROS: negative for - joint swelling or muscular weakness Neurological ROS: as noted in HPI Dermatological ROS: negative for rash and skin lesion changes  Physical Examination: Blood pressure 147/42, pulse 62,  temperature 97.8 F (36.6 C), temperature source Oral, resp. rate 14, height 5\' 7"  (1.702 m), weight 85.73 kg (189 lb), SpO2 98 %.  HEENT-  Normocephalic, no lesions, without obvious abnormality.  Normal external eye and conjunctiva.  Normal TM's bilaterally.  Normal auditory canals and external ears. Normal external nose, mucus membranes and septum.  Normal pharynx. Cardiovascular- S1, S2 normal, +murmur, pulses palpable throughout   Lungs- chest clear, no wheezing, rales, normal symmetric air entry Abdomen- soft, non-tender; bowel sounds normal; no masses,  no organomegaly Extremities- no edema Lymph-no adenopathy palpable Musculoskeletal-no joint tenderness, deformity or swelling Skin-warm and dry, no hyperpigmentation, vitiligo, or suspicious lesions  Neurological Examination Mental Status: Alert, oriented, thought content appropriate.  Speech fluent without evidence of aphasia.  Able to follow 3 step commands without difficulty. Cranial Nerves: II: Discs flat bilaterally; Visual fields grossly normal,  pupils equal, round, reactive to light and accommodation III,IV, VI: ptosis not present, extra-ocular motions intact bilaterally V,VII: smile symmetric, facial light touch sensation normal bilaterally VIII: hearing normal bilaterally IX,X: gag reflex present XI: bilateral shoulder shrug XII: midline tongue extension Motor: Right : Upper extremity   5/5    Left:     Upper extremity   5/5  Lower extremity   5/5     Lower extremity   5/5 Tone and bulk:normal tone throughout; no atrophy noted Sensory: Pinprick and light touch intact throughout, bilaterally Deep Tendon Reflexes: 2+ and symmetric with absent AJ's bilaterally Plantars: Right: downgoing   Left: downgoing Cerebellar: normal finger-to-nose and normal heel-to-shin testing bilaterally Gait: normal gait and station    Laboratory Studies:  Basic Metabolic Panel: No results for input(s): NA, K, CL, CO2, GLUCOSE, BUN,  CREATININE, CALCIUM, MG, PHOS in the last 168 hours.  Liver Function Tests: No results for input(s): AST, ALT, ALKPHOS, BILITOT, PROT, ALBUMIN in the last 168 hours. No results for input(s): LIPASE, AMYLASE in the last 168 hours. No results for input(s): AMMONIA in the last 168 hours.  CBC: No results for input(s): WBC, NEUTROABS, HGB, HCT, MCV, PLT in the last 168 hours.  Cardiac Enzymes: No results for input(s): CKTOTAL, CKMB, CKMBINDEX, TROPONINI in the last 168 hours.  BNP: Invalid input(s): POCBNP  CBG:  Recent Labs Lab 06/25/15 1033  GLUCAP 120*    Microbiology: Results for orders placed or performed during the hospital encounter of 03/08/12  MRSA PCR Screening     Status: None   Collection Time: 03/08/12  2:57 PM  Result Value Ref Range Status   MRSA by PCR NEGATIVE NEGATIVE Final    Comment:        The GeneXpert MRSA Assay (FDA approved for NASAL specimens only), is one component of a comprehensive MRSA colonization surveillance program. It is not intended to diagnose MRSA infection nor to guide or monitor treatment for MRSA infections.    Coagulation Studies: No results for input(s): LABPROT, INR in the last 72 hours.  Urinalysis: No results for input(s): COLORURINE, LABSPEC, PHURINE, GLUCOSEU, HGBUR, BILIRUBINUR, KETONESUR, PROTEINUR, UROBILINOGEN, NITRITE, LEUKOCYTESUR in the last 168 hours.  Invalid input(s): APPERANCEUR  Lipid Panel:    Component Value Date/Time   CHOL 99 03/09/2012 0243   TRIG 84 03/09/2012 0243   HDL 37* 03/09/2012 0243   CHOLHDL 2.7 03/09/2012 0243   VLDL 17 03/09/2012 0243   LDLCALC 45 03/09/2012 0243    HgbA1C:  Lab Results  Component Value Date   HGBA1C * 02/15/2010    8.9 (NOTE)                                                                       According to the ADA Clinical Practice Recommendations for 2011, when HbA1c is used as a screening test:   >=6.5%   Diagnostic of Diabetes Mellitus           (if abnormal  result  is confirmed)  5.7-6.4%   Increased risk of developing Diabetes Mellitus  References:Diagnosis and Classification of Diabetes Mellitus,Diabetes [id number] 1):S62-S69 and Standards of Medical Care in         Diabetes - 2011,Diabetes Care,2011,34  (Suppl 1):S11-S61.  Urine Drug Screen:  No results found for: LABOPIA, COCAINSCRNUR, LABBENZ, AMPHETMU, THCU, LABBARB  Alcohol Level: No results for input(s): ETH in the last 168 hours.  Other results: EKG: sinus bradycardia at 58 bpm.  Imaging: Ct Head Wo Contrast  06/25/2015  CLINICAL DATA:  Slurred speech, code stroke EXAM: CT HEAD WITHOUT CONTRAST TECHNIQUE: Contiguous axial images were obtained from the base of the skull through the vertex without intravenous contrast. COMPARISON:  None. FINDINGS: There is no evidence of mass effect, midline shift, or extra-axial fluid collections. There is no evidence of a space-occupying lesion or intracranial hemorrhage. There is no evidence of a cortical-based area of acute infarction. There is generalized cerebral atrophy. There is periventricular white matter low attenuation likely secondary to microangiopathy. The ventricles and sulci are appropriate for the patient's age. The basal cisterns are patent. Visualized portions of the orbits are unremarkable. The visualized portions of the paranasal sinuses and mastoid air cells are unremarkable. Cerebrovascular atherosclerotic calcifications are noted. The osseous structures are unremarkable. IMPRESSION: 1. No acute intracranial pathology. 2. Chronic microvascular disease and cerebral atrophy. These results were called by telephone at the time of interpretation on 06/25/2015 at 10:33 am to Dr. Lenise Arena , who verbally acknowledged these results. Electronically Signed   By: Kathreen Devoid   On: 06/25/2015 10:34    Assessment: 70 y.o. male with a history of atrial fibrillation on Coumadin who presents after an episode of dizziness, blurred vision  and tongue numbness.  Symptoms now resolved.  NIHSS of 0.  Head CT personally reviewed and shows no acute changes.  Concern is for posterior circulation pathology.     Stroke Risk Factors - atrial fibrillation, diabetes mellitus, hyperlipidemia and hypertension  Plan: 1.  CTA of the head and neck.    Alexis Goodell, MD Neurology [phone] 06/25/2015, 10:39 AM  Addendum: CTA of head and neck reviewed.  CTA shows 80% right carotid bifurcation stenosis and 85% left carotid bifurcation stenosis.  There is moderate to marked right vertebral stenosis and moderate left vertebral stenosis but no evidence of basilar thrombosis.  Patient on Coumadin with therapeutic INR therefore at maximum medical management.  Head CT shows possible question of an acute parapontine infarct but this is not consistent with presentation.  Patient has had an echocardiogram within the past few days although results are not available at this time.   With patient therapeutic on Coumadin further neurological work up is not indicated at this time.  Recommendations: 1.  Patient to have follow up with cardiology 2.  Vascular to evaluate patient for stenoses.  Do not suspect patient was symptomatic from these today.   3.  Continue Coumadin  Case discussed with Dr. Simon Rhein, MD Neurology [phone]

## 2015-06-25 NOTE — ED Provider Notes (Signed)
[facility] Emergency Department Provider Note     Time seen: ----------------------------------------- 10:22 AM on 06/25/2015 -----------------------------------------    I have reviewed the triage vital signs and the nursing notes.   HISTORY  Chief Complaint Numbness and Dizziness    HPI Seth Hardy is a 70 y.o. male who presents ER for sudden onset numbness of the tongue, dizziness and visual changes that last about 10 minutes just prior to 9 AM today.Patient states he woke up this morning's blood sugar 70, he took his insulin and metformin and then drove to [place]. Patient states while eating he began having the symptoms and this subsequently resolved. Patient's troponin himself home and his wife urged him to come to the ER for evaluation. Currently has no complaints.   Past Medical History  Diagnosis Date  . HTN (hypertension)   . Hyperlipidemia   . Diabetes mellitus type II, controlled ([place])     a. Variable CBG 02/2012 - several meds adjusted.  . Mild aortic stenosis     a. 03/2011 Echo: EF 55-60%, Mild AS. b. Mild by cath 02/2012.  . Carotid disease, bilateral ([place])     a. 03/2011 U/S: 40-59% bilat Carotid dzs  . Obesity   . GERD (gastroesophageal reflux disease)   . PAF (paroxysmal atrial fibrillation) ([place])     a. Newly dx 02/2012 & initiated on Coumadin (spont converted to NSR).  Marland Kitchen CAD (coronary artery disease)     a. 01/2010 CABG x 4: LIMA->LAD, VG->D1, VG->OM1, VG->PDA. b. NSTEMI 02/2012 in setting of AF-RVR with cath s/p BMS to RCA (plan for 1 month, possibly up to 3 months of Plavix)  . Sleep apnea     Patient Active Problem List   Diagnosis Date Noted  . Hyperlipidemia 03/14/2015  . Atrial fibrillation ([place]) 03/23/2012  . Long term (current) use of anticoagulants 03/14/2012  . Non-STEMI (non-ST elevated myocardial infarction) ([place]) 03/09/2012  . Carotid stenosis 05/28/2010  . CAD, ARTERY BYPASS GRAFT 04/07/2010  .  CAD, NATIVE VESSEL 03/13/2009  . Aortic valve stenosis, mild 11/01/2008    Past Surgical History  Procedure Laterality Date  . Cyst l kidney    . Angioplasty    . Coronary artery bypass graft    . Cardiac catheterization    . Eye surgery    . Hernia repair    . Left heart catheterization with coronary angiogram N/A 03/09/2012    Procedure: LEFT HEART CATHETERIZATION WITH CORONARY ANGIOGRAM;  Surgeon: Wellington Hampshire, MD;  Location: [facility] CATH LAB;  Service: Cardiovascular;  Laterality: N/A;  . Percutaneous coronary stent intervention (pci-s)  03/09/2012    Procedure: PERCUTANEOUS CORONARY STENT INTERVENTION (PCI-S);  Surgeon: Wellington Hampshire, MD;  Location: [facility] CATH LAB;  Service: Cardiovascular;;    Allergies Review of patient's allergies indicates no known allergies.  Social History Social History  Substance Use Topics  . Smoking status: Never Smoker   . Smokeless tobacco: None     Comment: tobacco use - no  . Alcohol Use: No     Comment: rare drink    Review of Systems Constitutional: Negative for fever. Eyes: Negative for visual changes. ENT: Negative for sore throat. Cardiovascular: Negative for chest pain. Respiratory: Negative for shortness of breath. Gastrointestinal: Negative for abdominal pain, vomiting and diarrhea. Genitourinary: Negative for dysuria. Musculoskeletal: Negative for back pain. Skin: Negative for rash. Neurological: Positive for numbness dizziness and visual changes this morning  10-point ROS otherwise negative.  ____________________________________________   PHYSICAL EXAM:  VITAL SIGNS: ED Triage Vitals  Enc Vitals Group     BP 06/25/15 1018 147/42 mmHg     Pulse Rate 06/25/15 1018 62     Resp 06/25/15 1018 14     Temp 06/25/15 1018 97.8 F (36.6 C)     Temp Source 06/25/15 1018 Oral     SpO2 06/25/15 1018 98 %     Weight 06/25/15 1018 189 lb (85.73 kg)     Height 06/25/15 1018 _0  (1.702 m)     Head Cir --      Peak Flow --       Pain Score --      Pain Loc --      Pain Edu? --      Excl. in [place]? --     Constitutional: Alert and oriented. Well appearing and in no distress. Eyes: Conjunctivae are normal. PERRL. Normal extraocular movements. ENT   Head: Normocephalic and atraumatic.   Nose: No congestion/rhinnorhea.   Mouth/Throat: Mucous membranes are moist.   Neck: No stridor. Cardiovascular: Normal rate, regular rhythm. Normal and symmetric distal pulses are present in all extremities. No murmurs, rubs, or gallops. Respiratory: Normal respiratory effort without tachypnea nor retractions. Breath sounds are clear and equal bilaterally. No wheezes/rales/rhonchi. Gastrointestinal: Soft and nontender. No distention. No abdominal bruits.  Musculoskeletal: Nontender with normal range of motion in all extremities. No joint effusions.  No lower extremity tenderness nor edema. Neurologic:  Normal speech and language. No gross focal neurologic deficits are appreciated. Speech is normal. No gait instability. Skin:  Skin is warm, dry and intact. No rash noted. Psychiatric: Mood and affect are normal. Speech and behavior are normal. Patient exhibits appropriate insight and judgment. ____________________________________________  EKG: Interpreted by me. Sinus bradycardia with rate of 58 bpm, normal PR interval, normal QRS, normal QT interval. Nonspecific ST and T-wave changes throughout.  ____________________________________________  ED COURSE:  Pertinent labs & imaging results that were available during my care of the patient were reviewed by me and considered in my medical decision making (see chart for details). Patient is no acute distress, patient was met in the room by the neuro hospitalist. ____________________________________________    LABS (pertinent positives/negatives)  Labs Reviewed  PROTIME-INR - Abnormal; Notable for the following:    Prothrombin Time 27.7 (*)    All other components  within normal limits  CBC - Abnormal; Notable for the following:    RBC 4.17 (*)    Hemoglobin 9.6 (*)    HCT 30.9 (*)    MCV 74.3 (*)    MCH 23.0 (*)    MCHC 30.9 (*)    RDW 15.2 (*)    All other components within normal limits  DIFFERENTIAL - Abnormal; Notable for the following:    Lymphs Abs 0.7 (*)    All other components within normal limits  COMPREHENSIVE METABOLIC PANEL - Abnormal; Notable for the following:    Glucose, Bld 119 (*)    All other components within normal limits  GLUCOSE, CAPILLARY - Abnormal; Notable for the following:    Glucose-Capillary 120 (*)    All other components within normal limits  APTT  TROPONIN I  CBG MONITORING, ED    RADIOLOGY  CT head Is unremarkable CT angiogram was also performed. ____________________________________________  FINAL ASSESSMENT AND PLAN  Dizziness  Plan: Patient with labs and imaging as dictated above. Patient had transient episode of dizziness numbness and visual changes that last about 10 minutes. Seen by the neuro  hospitalist here will perform a CT angiogram of the head and neck.  CT angiogram was abnormal for bilateral carotid stenoses. I discussed with vascular surgery who will see him in 48 hours for recheck. He can continue on his current Coumadin dosing. Symptoms today were likely hypoglycemia related. The neuro hospitalist has agreed that he can continue Coumadin and follow-up as an outpatient.  Earleen Newport, MD   Earleen Newport, MD 06/25/15 [phone]

## 2015-06-25 NOTE — Discharge Instructions (Signed)

## 2015-06-26 ENCOUNTER — Telehealth: Payer: Self-pay | Admitting: Cardiovascular Disease

## 2015-06-26 NOTE — Telephone Encounter (Signed)
S/w pt who reports going to ED yesterday. (see ED notes) States he was advised that sx he experienced could have been related to low BS as pt reports BS 77 yesterday morning, took insulin then drove to [place] before eating.  While in the ED, pt states he had a CT scan that showed 80-85% carotid stenosis bilaterally and appt was made w/Dr. Payton Mccallum for Feb 2. Pt is concerned because December carotid duplex did not show this percentage and he hopes the CT results were incorrect. He inquires as to whether he should keep his appt at [facility]. Suggested to pt to keep appt and that I would forward Dec results to Dr. Payton Mccallum. Pt agreeable w/plan  Carotid duplex routed

## 2015-06-26 NOTE — Telephone Encounter (Signed)
Patient wants to speak with Nurse today .

## 2015-06-27 ENCOUNTER — Encounter: Payer: Self-pay | Admitting: Nurse Practitioner

## 2015-06-27 ENCOUNTER — Ambulatory Visit (INDEPENDENT_AMBULATORY_CARE_PROVIDER_SITE_OTHER): Payer: PPO | Admitting: Nurse Practitioner

## 2015-06-27 VITALS — BP 155/70 | HR 53 | Ht 68.0 in | Wt 191.8 lb

## 2015-06-27 DIAGNOSIS — I4891 Unspecified atrial fibrillation: Secondary | ICD-10-CM | POA: Diagnosis not present

## 2015-06-27 DIAGNOSIS — Z794 Long term (current) use of insulin: Secondary | ICD-10-CM

## 2015-06-27 DIAGNOSIS — E669 Obesity, unspecified: Secondary | ICD-10-CM | POA: Diagnosis not present

## 2015-06-27 DIAGNOSIS — I480 Paroxysmal atrial fibrillation: Secondary | ICD-10-CM

## 2015-06-27 DIAGNOSIS — I10 Essential (primary) hypertension: Secondary | ICD-10-CM | POA: Diagnosis not present

## 2015-06-27 DIAGNOSIS — I779 Disorder of arteries and arterioles, unspecified: Secondary | ICD-10-CM | POA: Insufficient documentation

## 2015-06-27 DIAGNOSIS — I2510 Atherosclerotic heart disease of native coronary artery without angina pectoris: Secondary | ICD-10-CM

## 2015-06-27 DIAGNOSIS — I350 Nonrheumatic aortic (valve) stenosis: Secondary | ICD-10-CM | POA: Diagnosis not present

## 2015-06-27 DIAGNOSIS — Z01810 Encounter for preprocedural cardiovascular examination: Secondary | ICD-10-CM | POA: Diagnosis not present

## 2015-06-27 DIAGNOSIS — E119 Type 2 diabetes mellitus without complications: Secondary | ICD-10-CM | POA: Insufficient documentation

## 2015-06-27 DIAGNOSIS — I739 Peripheral vascular disease, unspecified: Secondary | ICD-10-CM

## 2015-06-27 DIAGNOSIS — I119 Hypertensive heart disease without heart failure: Secondary | ICD-10-CM

## 2015-06-27 DIAGNOSIS — I6523 Occlusion and stenosis of bilateral carotid arteries: Secondary | ICD-10-CM | POA: Diagnosis not present

## 2015-06-27 DIAGNOSIS — E785 Hyperlipidemia, unspecified: Secondary | ICD-10-CM | POA: Diagnosis not present

## 2015-06-27 DIAGNOSIS — G459 Transient cerebral ischemic attack, unspecified: Secondary | ICD-10-CM | POA: Diagnosis not present

## 2015-06-27 NOTE — Patient Instructions (Addendum)
Medication Instructions:  Please continue your current medications  Labwork: None  Testing/Procedures: Your physician has requested that you have an echocardiogram. Echocardiography is a painless test that uses sound waves to create images of your heart. It provides your doctor with information about the size and shape of your heart and how well your heart's chambers and valves are working. This procedure takes approximately one hour. There are no restrictions for this procedure.  Date & time: _____________________  Follow-Up: Your physician wants you to follow-up in: 6 months w/ Dr. Zannie Cove will receive a reminder letter in the mail two months in advance.  If you don't receive a letter, please call our office to schedule the follow-up appointment.  If you need a refill on your cardiac medications before your next appointment, please call your pharmacy.

## 2015-06-27 NOTE — Progress Notes (Signed)
Office Visit    Patient Name: Seth Hardy Date of Encounter: 06/27/2015  Primary Care Provider:  Rusty Aus., MD Primary Cardiologist:  Jerilynn Mages. Fletcher Anon, MD   Chief Complaint    70 year old male with a history of CAD coming proximally fibrillation, and carotid arterial disease who presents today for preoperative evaluation related to pending left carotid endarterectomy.  Past Medical History    Past Medical History  Diagnosis Date  . Hypertensive heart disease   . Hyperlipidemia   . Diabetes mellitus type II, controlled ([place])     a. Variable CBG 02/2012 - several meds adjusted.  . Mild aortic stenosis     a. 03/2011 Echo: EF 55-60%, Mild AS. b. Mild by cath 02/2012; c. 01/2014 Echo: EF 65-70%, Gr 1 DD, mild AS, mildly dil LA.  . Obesity   . GERD (gastroesophageal reflux disease)   . PAF (paroxysmal atrial fibrillation) ([place])     a. Newly dx 02/2012 & initiated on Coumadin (spont converted to NSR).  Marland Kitchen CAD (coronary artery disease)     a. 01/2010 CABG x 4: LIMA->LAD, VG->D1, VG->OM1, VG->PDA. b. NSTEMI 02/2012 in setting of AF-RVR with cath s/p BMS to RCA (plan for 1 month, possibly up to 3 months of Plavix)  . Sleep apnea   . Carotid disease, bilateral ([place])     a. 03/2011 U/S: 40-59% bilat Carotid dzs;  b. 04/2015 Carotid U/S: 40-59% bilat ICA stenosis; c. 05/2015 CTA Neck: [id number] RICA, [id number] LICA, mod-marked R vertebral stenosis, mod L vertebral stenosis.   Past Surgical History  Procedure Laterality Date  . Cyst l kidney    . Angioplasty    . Coronary artery bypass graft    . Cardiac catheterization    . Eye surgery    . Hernia repair    . Left heart catheterization with coronary angiogram N/A 03/09/2012    Procedure: LEFT HEART CATHETERIZATION WITH CORONARY ANGIOGRAM;  Surgeon: Wellington Hampshire, MD;  Location: [facility] CATH LAB;  Service: Cardiovascular;  Laterality: N/A;  . Percutaneous coronary stent intervention (pci-s)  03/09/2012    Procedure: PERCUTANEOUS CORONARY STENT  INTERVENTION (PCI-S);  Surgeon: Wellington Hampshire, MD;  Location: [facility] CATH LAB;  Service: Cardiovascular;;  . Cataract extraction Bilateral     Allergies  No Known Allergies  History of Present Illness    70 year old male with the above complex past medical history. He has a history of coronary artery disease and is status post bypass grafting in September 2011 with subsequent bare metal stenting of the right coronary artery in 2013. He also has a history of paroxysmal atrial fibrillation and is chronically anticoagulated on Coumadin. He otherwise has a history of hypertension, hyperlipidemia, bilateral carotid arterial disease, and diabetes. He was recently evaluated in the [facility] emergency department on January 31 after he developed blurring of his vision and numbness of his tongue just prior to eating biscuit. He notes, that he took his full complement of morning diabetic medications that morning without anything on his stomach. He ate his meal and slowly but surely felt better. He remained concerned after returning home and decided to present to the ED. There, there was question of right paracentral pontine infarct on CT. CT angiography revealed significant bilateral carotid arterial disease and vertebral disease as outlined above. He was evaluated by neurology who did not feel that his symptoms were consistent with stroke. Ultimately, it was felt that symptoms were most likely secondary to hypoglycemia. Due to significant bilateral carotid stenosis,  a referral to vascular surgery was made. Patient saw vascular surgery this morning with recommendation for left carotid endarterectomy in the near future.  From a cardiac standpoint, Mr. Heyser has been doing well without any chest pain or dyspnea. He is fairly active without any significant limitations in his activity. He denies PND, orthopnea, palpitations, dizziness, syncope, edema, or early satiety. He has no prior history of stroke.  Home  Medications    Prior to Admission medications   Medication Sig Start Date End Date Taking? Authorizing Provider  acetaminophen (TYLENOL) 325 MG tablet Take 650 mg by mouth every 6 (six) hours as needed. For pain   Yes Historical Provider, MD  amiodarone (PACERONE) 200 MG tablet Take 0.5 tablets (100 mg total) by mouth daily. 10/12/14  Yes Wellington Hampshire, MD  amLODipine (NORVASC) 5 MG tablet Take one tablet by mouth one time daily 07/25/14  Yes Wellington Hampshire, MD  aspirin 81 MG EC tablet Take 81 mg by mouth daily.     Yes Historical Provider, MD  atorvastatin (LIPITOR) 20 MG tablet Take 1 tablet (20 mg total) by mouth at bedtime. 10/12/14  Yes Wellington Hampshire, MD  furosemide (LASIX) 20 MG tablet Take 1 tablet (20 mg total) by mouth as needed. 04/16/15  Yes Wellington Hampshire, MD  glimepiride (AMARYL) 4 MG tablet Take 4 mg by mouth 2 (two) times daily.     Yes Historical Provider, MD  insulin glargine (LANTUS) 100 UNIT/ML injection Inject 25 Units into the skin daily.    Yes Historical Provider, MD  insulin lispro (HUMALOG) 100 UNIT/ML injection Inject 8 Units into the skin 2 (two) times daily with a meal. Patient taking differently: Inject 8 Units into the skin 3 (three) times daily. Per sliding scale. 03/10/12  Yes Dayna N Dunn, PA-C  metFORMIN (GLUCOPHAGE) 500 MG tablet Take 500 mg by mouth 2 (two) times daily with a meal.   Yes Historical Provider, MD  nitroGLYCERIN (NITROSTAT) 0.4 MG SL tablet Place 1 tablet (0.4 mg total) under the tongue every 5 (five) minutes as needed (up to 3 doses). 03/14/15  Yes Wellington Hampshire, MD  Omega-3 Fatty Acids (FISH OIL) 1200 MG CAPS Take 1 capsule by mouth daily.     Yes Historical Provider, MD  pantoprazole (PROTONIX) 40 MG tablet Take 1 tablet (40 mg total) by mouth daily. 06/11/14  Yes Wellington Hampshire, MD  ramipril (ALTACE) 10 MG capsule Take 1 capsule (10 mg total) by mouth daily. 06/11/14  Yes Wellington Hampshire, MD  vitamin C (ASCORBIC ACID) 500 MG tablet  Take 500 mg by mouth daily.     Yes Historical Provider, MD  warfarin (COUMADIN) 5 MG tablet Take as directed by anticoagulation clinic 10/10/14  Yes Wellington Hampshire, MD    Review of Systems    He has done well from a cardiac standpoint without chest pain, palpitations, PND, orthopnea, syncope, edema, or early satiety. He did recently have an episode of tongue numbness and blurred vision, which was ultimately felt to be secondary to hypoglycemia.  All other systems reviewed and are otherwise negative except as noted above.  Physical Exam    VS:  BP 155/70 mmHg  Pulse 53  Ht 5\' 8"  (1.727 m)  Wt 191 lb 12 oz (86.977 kg)  BMI 29.16 kg/m2 , BMI Body mass index is 29.16 kg/(m^2). GEN: Well nourished, well developed, in no acute distress. HEENT: normal. Neck: Supple, no JVD, carotid bruits, or masses. Cardiac:  RRR, 3/6 systolic ejection murmur heard throughout, no rubs, or gallops. No clubbing, cyanosis, edema.  Radials/DP/PT 2+ and equal bilaterally.  Respiratory:  Respirations regular and unlabored, clear to auscultation bilaterally. GI: Soft, nontender, nondistended, BS + x 4. MS: no deformity or atrophy. Skin: warm and dry, no rash. Neuro:  Strength and sensation are intact. Psych: Normal affect.  Accessory Clinical Findings    ECG - sinus bradycardia, 53, antlat stdep w/ biphasic t waves - unchanged.  Assessment & Plan    1.  Preoperative evaluation for left greater than right carotid arterial stenosis: Patient was recently seen in the emergency department secondary to complaints of blurred vision and numbness of his tongue. Workup included CT angiogram of the head and neck, which revealed significant bilateral carotid arterial disease. It was not felt that his symptoms or CT findings represented a stroke. She has been seen by vascular surgery with recommendation for left carotid endarterectomy. Patient first plans to go to [facility] with some friends towards the end of February  and then have procedure performed electively in early March. From a cardiac standpoint, he has done well without any chest pain or dyspnea. He is fairly active with good functional status. In that setting, he will not require any additional ischemic evaluation prior to surgery. I recommend continuation of amiodarone and statin therapy throughout the perioperative period. He may come off of Coumadin 3-5 days prior to surgery as preferred by the surgical team. As he has no prior history of stroke, he does not require Lovenox bridging. I have discussed his case with Dr. Fletcher Anon today and he is in agreement with this plan.  2. Coronary artery disease: As above, patient has been doing well and does not require any preoperative ischemic evaluation. He remains on aspirin, statin, and ACE inhibitor therapy.  3. Paroxysmal atrial fibrillation: Patient is in sinus rhythm today. Continue amiodarone throughout the perioperative period as he would be at risk for postoperative atrial fibrillation. He may hold Coumadin as necessary per the surgical team prior to surgery. He does not require bridging.  4. Hypertensive heart disease: Blood pressure is moderately elevated at 155/70. Given carotid stenosis, we will allow for slightly higher pressures and I will not make any changes today.  5. Hyperlipidemia: Continue statin therapy. Next  6. Type 2 diabetes mellitus: He is on insulin and this is managed by primary care.  7. Mild aortic stenosis: He has a loud systolic murmur heard throughout. This has been stable in the past. Is been greater than 1 year since his last echo. We will repeat.  8. Disposition: Follow-up with Dr. Fletcher Anon in 6 months or sooner if necessary.   Murray Hodgkins, NP 06/27/2015, 5:04 PM

## 2015-06-28 ENCOUNTER — Telehealth: Payer: Self-pay | Admitting: *Deleted

## 2015-06-28 NOTE — Telephone Encounter (Signed)
Pt calling stating he was seen By Dr Sabra Heck last week and forgot he already may have did an echo at his office.  Was not sure if we were able to pull those results up (In [place]) Letting us know for we asked him to do and echo and he is on the schedule for the 07/08/15  Please advise if patient needs to do another one.

## 2015-07-01 NOTE — Telephone Encounter (Signed)
I was able to find echo in care everywhere.  Ao Stenosis is stable (mild - mod).  Normal LV fxn.  He does not need another echo in our office and we should cancel.  Thanks,  Gerald Stabs

## 2015-07-01 NOTE — Telephone Encounter (Signed)
Spoke w/ pt.  Advised him of Chris's recommendation. He is appreciative of the call.

## 2015-07-08 ENCOUNTER — Other Ambulatory Visit: Payer: Self-pay

## 2015-07-23 ENCOUNTER — Other Ambulatory Visit: Payer: Self-pay | Admitting: Vascular Surgery

## 2015-07-23 DIAGNOSIS — G4733 Obstructive sleep apnea (adult) (pediatric): Secondary | ICD-10-CM | POA: Diagnosis not present

## 2015-07-23 DIAGNOSIS — I2510 Atherosclerotic heart disease of native coronary artery without angina pectoris: Secondary | ICD-10-CM | POA: Diagnosis not present

## 2015-07-23 DIAGNOSIS — R0689 Other abnormalities of breathing: Secondary | ICD-10-CM | POA: Diagnosis not present

## 2015-07-23 DIAGNOSIS — R011 Cardiac murmur, unspecified: Secondary | ICD-10-CM | POA: Diagnosis not present

## 2015-07-25 ENCOUNTER — Encounter
Admission: RE | Admit: 2015-07-25 | Discharge: 2015-07-25 | Disposition: A | Discharge status: Home / Self Care | Payer: PPO | Source: Ambulatory Visit | Attending: Vascular Surgery | Admitting: Vascular Surgery

## 2015-07-25 ENCOUNTER — Ambulatory Visit
Admission: RE | Admit: 2015-07-25 | Discharge: 2015-07-25 | Disposition: A | Discharge status: Home / Self Care | Payer: PPO | Source: Ambulatory Visit | Attending: Vascular Surgery | Admitting: Vascular Surgery

## 2015-07-25 DIAGNOSIS — J90 Pleural effusion, not elsewhere classified: Secondary | ICD-10-CM | POA: Diagnosis not present

## 2015-07-25 DIAGNOSIS — E113513 Type 2 diabetes mellitus with proliferative diabetic retinopathy with macular edema, bilateral: Secondary | ICD-10-CM | POA: Diagnosis not present

## 2015-07-25 DIAGNOSIS — Z01818 Encounter for other preprocedural examination: Secondary | ICD-10-CM

## 2015-07-25 DIAGNOSIS — R918 Other nonspecific abnormal finding of lung field: Secondary | ICD-10-CM | POA: Insufficient documentation

## 2015-07-25 DIAGNOSIS — Z01810 Encounter for preprocedural cardiovascular examination: Secondary | ICD-10-CM | POA: Diagnosis not present

## 2015-07-25 DIAGNOSIS — I6523 Occlusion and stenosis of bilateral carotid arteries: Secondary | ICD-10-CM | POA: Diagnosis not present

## 2015-07-25 DIAGNOSIS — E785 Hyperlipidemia, unspecified: Secondary | ICD-10-CM | POA: Diagnosis not present

## 2015-07-25 DIAGNOSIS — I2510 Atherosclerotic heart disease of native coronary artery without angina pectoris: Secondary | ICD-10-CM | POA: Diagnosis not present

## 2015-07-25 DIAGNOSIS — I10 Essential (primary) hypertension: Secondary | ICD-10-CM | POA: Diagnosis not present

## 2015-07-25 DIAGNOSIS — G459 Transient cerebral ischemic attack, unspecified: Secondary | ICD-10-CM | POA: Diagnosis not present

## 2015-07-25 DIAGNOSIS — E119 Type 2 diabetes mellitus without complications: Secondary | ICD-10-CM | POA: Diagnosis not present

## 2015-07-25 DIAGNOSIS — E669 Obesity, unspecified: Secondary | ICD-10-CM | POA: Diagnosis not present

## 2015-07-25 HISTORY — DX: Chronic kidney disease, unspecified: N18.9

## 2015-07-25 HISTORY — DX: Essential (primary) hypertension: I10

## 2015-07-25 HISTORY — DX: Reserved for inherently not codable concepts without codable children: IMO0001

## 2015-07-25 HISTORY — DX: Acute myocardial infarction, unspecified: I21.9

## 2015-07-25 LAB — BASIC METABOLIC PANEL
Anion gap: 9 (ref 5–15)
BUN: 14 mg/dL (ref 6–20)
CO2: 27 mmol/L (ref 22–32)
Calcium: 9.1 mg/dL (ref 8.9–10.3)
Chloride: 103 mmol/L (ref 101–111)
Creatinine, Ser: 1.03 mg/dL (ref 0.61–1.24)
GFR calc Af Amer: >60 mL/min (ref >60)
GFR calc non Af Amer: >60 mL/min (ref >60)
Glucose, Bld: 216 mg/dL — ABNORMAL HIGH (ref 65–99)
Potassium: 4.1 mmol/L (ref 3.5–5.1)
Sodium: 139 mmol/L (ref 135–145)

## 2015-07-25 LAB — CBC WITH DIFFERENTIAL/PLATELET
Basophils Absolute: 0 10*3/uL (ref 0–0.1)
Basophils Relative: 1 %
Eosinophils Absolute: 0.1 10*3/uL (ref 0–0.7)
Eosinophils Relative: 2 %
HCT: 27.8 % — ABNORMAL LOW (ref 40.0–52.0)
Hemoglobin: 8.8 g/dL — ABNORMAL LOW (ref 13.0–18.0)
Lymphocytes Relative: 21 %
Lymphs Abs: 0.9 10*3/uL — ABNORMAL LOW (ref 1.0–3.6)
MCH: 22 pg — ABNORMAL LOW (ref 26.0–34.0)
MCHC: 31.4 g/dL — ABNORMAL LOW (ref 32.0–36.0)
MCV: 70 fL — ABNORMAL LOW (ref 80.0–100.0)
Monocytes Absolute: 0.5 10*3/uL (ref 0.2–1.0)
Monocytes Relative: 12 %
Neutro Abs: 2.9 10*3/uL (ref 1.4–6.5)
Neutrophils Relative %: 64 %
Platelets: 226 10*3/uL (ref 150–440)
RBC: 3.98 MIL/uL — ABNORMAL LOW (ref 4.40–5.90)
RDW: 16.8 % — ABNORMAL HIGH (ref 11.5–14.5)
WBC: 4.5 10*3/uL (ref 3.8–10.6)

## 2015-07-25 LAB — PROTIME-INR
INR: 2.35
Prothrombin Time: 25.5 seconds — ABNORMAL HIGH (ref 11.4–15.0)

## 2015-07-25 LAB — TYPE AND SCREEN
ABO/RH(D): O POS
Antibody Screen: NEGATIVE

## 2015-07-25 LAB — SURGICAL PCR SCREEN
MRSA, PCR: NEGATIVE
Staphylococcus aureus: NEGATIVE

## 2015-07-25 LAB — APTT: aPTT: 39 seconds — ABNORMAL HIGH (ref 24–36)

## 2015-07-25 LAB — ABO/RH: ABO/RH(D): O POS

## 2015-07-25 NOTE — Patient Instructions (Signed)
  Your procedure is scheduled on: August 09, 2015 (Friday) Report to Day Surgery. [facility] ) Second Floor To find out your arrival time please call [phone] between 1PM - 3PM on August 08, 2015 (Thursday).  Remember: Instructions that are not followed completely may result in serious medical risk, up to and including death, or upon the discretion of your surgeon and anesthesiologist your surgery may need to be rescheduled.    __x__ 1. Do not eat food or drink liquids after midnight. No gum chewing or hard candies.     __x__ 2. No Alcohol for 24 hours before or after surgery.   ____ 3. Bring all medications with you on the day of surgery if instructed.    __x 4. Notify your doctor if there is any change in your medical condition     (cold, fever, infections).     Do not wear jewelry, make-up, hairpins, clips or nail polish.  Do not wear lotions, powders, or perfumes. You may wear deodorant.  Do not shave 48 hours prior to surgery. Men may shave face and neck.  Do not bring valuables to the hospital.    [facility] is not responsible for any belongings or valuables.               Contacts, dentures or bridgework may not be worn into surgery.  Leave your suitcase in the car. After surgery it may be brought to your room.  For patients admitted to the hospital, discharge time is determined by your                treatment team.   Patients discharged the day of surgery will not be allowed to drive home.   Please read over the following fact sheets that you were given:   MRSA Information and Surgical Site Infection Prevention   ____ Take these medicines the morning of surgery with A SIP OF WATER:    1. Amlodipine  2. Ramipril  3. Pantoprazole  4.  5.  6.  ____ Fleet Enema (as directed)   _x___ Use CHG Soap as directed  ____ Use inhalers on the day of surgery  _x___ Stop metformin 2 days prior to surgery ( STOP METFORMIN ON  MARCH  15)  __X__ Take 1/2 of usual insulin  dose the night before surgery and none on the morning of surgery. (NO INSULIN THE MORNING OF YOUR SURGERY, AND ONE-HALF OF INSULIN THE NIGHT BEFORE IF NEEDED AT BEDTIME)  __x__ Stop Coumadin/Plavix/aspirin on (STOP COUMADIN THREE DAYS PRIOR TO SURGERY) DO NOT STOP ASPIRIN, BUT DO NOT TAKE ASPIRIN THE MORNING OF SURGERY)  __x__ Stop Anti-inflammatories on (NO NSAIDS)   __x__ Stop supplements until after surgery.  (STOP FISH OIL, AND VITAMIN  C NOW)  __x__ Bring C-Pap to the hospital.

## 2015-07-29 ENCOUNTER — Encounter: Payer: Self-pay | Admitting: *Deleted

## 2015-07-29 NOTE — Pre-Procedure Instructions (Addendum)
CLEARED BY CARDIOLOGY DR Fletcher Anon 06/27/15. CXR WAS FAXED TO DR [facility] 07/25/15. [place]. IF NO WEIGHT GAIN,  OR SOB OK TO PROCEED. WEIGHT 191 06/27/15/ WEIGHT 190  07/25/15. SPOKE WITH PATIENT AND SOME SOB LATELY. WILL FAX CXR  TO DR [name]. SPOKE WITH SABRINA. PATIENT AWARE. SPOKE [place]

## 2015-07-30 ENCOUNTER — Telehealth: Payer: Self-pay | Admitting: Cardiovascular Disease

## 2015-07-30 NOTE — Telephone Encounter (Signed)
Per note from Dr. Fletcher Anon, due to chest xray findings, have pt take scheduled 20mg  lasix daily for 3 days. S/w pt who verbalized understanding of instructions. Faxed clearance for March 17 left carotid endarterectomy to [facility], [phone]

## 2015-07-30 NOTE — Pre-Procedure Instructions (Signed)
CLEARED BY DR [name] LOW RISK 07/29/15 AFTER ADDRESSING CXR

## 2015-07-31 ENCOUNTER — Ambulatory Visit (INDEPENDENT_AMBULATORY_CARE_PROVIDER_SITE_OTHER): Payer: PPO

## 2015-07-31 ENCOUNTER — Telehealth: Payer: Self-pay | Admitting: Cardiovascular Disease

## 2015-07-31 DIAGNOSIS — I4891 Unspecified atrial fibrillation: Secondary | ICD-10-CM | POA: Diagnosis not present

## 2015-07-31 DIAGNOSIS — Z7901 Long term (current) use of anticoagulants: Secondary | ICD-10-CM

## 2015-07-31 LAB — POCT INR: INR: 2.4

## 2015-07-31 NOTE — Telephone Encounter (Signed)
Pt c/o Shortness Of Breath: STAT if SOB developed within the last 24 hours or pt is noticeably SOB on the phone  1. Are you currently SOB (can you hear that pt is SOB on the phone)? no  2. How long have you been experiencing SOB? 1 week  3. Are you SOB when sitting or when up moving around? Moving around  4. Are you currently experiencing any other symptoms? No  Having carotid surgery on 08/09/15 and he is worried they may cancel it. Please call patient.

## 2015-07-31 NOTE — Telephone Encounter (Signed)
Spoke w/ pt.  Advised him to limit his sodium & fluid intake.  He reports that his diet has not changed, he usually only drinks water or tea w/ his meals. He is sched for surgery on 08/09/15. A friend of his had a surgery that was cancelled due to SOB and he is worried his will be, as well.  Dr. Fletcher Anon has an opening on Monday 3/13, added pt on so the he can have peace of mind before his surgery.  Pt is appreciative.  He is going out of town to [employer] this weekend, will come back early to keep this appt.

## 2015-07-31 NOTE — Telephone Encounter (Signed)
Called pt, he is here for coumadin check.

## 2015-08-05 ENCOUNTER — Ambulatory Visit (INDEPENDENT_AMBULATORY_CARE_PROVIDER_SITE_OTHER): Payer: PPO | Admitting: Cardiovascular Disease

## 2015-08-05 ENCOUNTER — Encounter: Payer: Self-pay | Admitting: Cardiovascular Disease

## 2015-08-05 VITALS — BP 140/62 | HR 60 | Ht 68.0 in | Wt 188.8 lb

## 2015-08-05 DIAGNOSIS — R079 Chest pain, unspecified: Secondary | ICD-10-CM

## 2015-08-05 NOTE — Patient Instructions (Addendum)
Medication Instructions:  Your physician recommends that you continue on your current medications as directed. Please refer to the Current Medication list given to you today.  PLEASE START HOLDING YOUR COUMADIN TODAY FOR YOUR UPCOMING PROCEDURE.    Labwork: none  Testing/Procedures: Your physician has requested that you have a lexiscan myoview. For further information please visit [email or web address]. Please follow instruction sheet, as given.  [place]  Your caregiver has ordered a Stress Test with nuclear imaging. The purpose of this test is to evaluate the blood supply to your heart muscle. This procedure is referred to as a "Non-Invasive Stress Test." This is because other than having an IV started in your vein, nothing is inserted or "invades" your body. Cardiac stress tests are done to find areas of poor blood flow to the heart by determining the extent of coronary artery disease (CAD). Some patients exercise on a treadmill, which naturally increases the blood flow to your heart, while others who are  unable to walk on a treadmill due to physical limitations have a pharmacologic/chemical stress agent called Lexiscan . This medicine will mimic walking on a treadmill by temporarily increasing your coronary blood flow.   Please note: these test may take anywhere between 2-4 hours to complete  PLEASE REPORT TO [place] AT THE FIRST DESK WILL DIRECT YOU WHERE TO GO  Date of Procedure:_____Tuesday, March 14____________  Arrival Time for Procedure:____7:45am______________  Instructions regarding medication:   _xxx___ : Hold diabetes medication morning of procedure   PLEASE NOTIFY THE OFFICE AT LEAST 24 HOURS IN ADVANCE IF YOU ARE UNABLE TO KEEP YOUR APPOINTMENT.  [phone] AND  PLEASE NOTIFY NUCLEAR MEDICINE AT [facility] AT LEAST 24 HOURS IN ADVANCE IF YOU ARE UNABLE TO KEEP YOUR APPOINTMENT. [phone]  How to prepare for your Myoview  test:   Do not eat or drink after midnight  No caffeine for 24 hours prior to test  No smoking 24 hours prior to test.  Your medication may be taken with water.  If your doctor stopped a medication because of this test, do not take that medication.  Ladies, please do not wear dresses.  Skirts or pants are appropriate. Please wear a short sleeve shirt.  No perfume, cologne or lotion.  Wear comfortable walking shoes. No heels!             Follow-Up: Your physician recommends that you schedule a follow-up appointment in: one month with Dr. Fletcher Anon.    Any Other Special Instructions Will Be Listed Below (If Applicable).     If you need a refill on your cardiac medications before your next appointment, please call your pharmacy.  Cardiac Nuclear Scanning A cardiac nuclear scan is used to check your heart for problems, such as the following:  A portion of the heart is not getting enough blood.  Part of the heart muscle has died, which happens with a heart attack.  The heart wall is not working normally.  In this test, a radioactive dye (tracer) is injected into your bloodstream. After the tracer has traveled to your heart, a scanning device is used to measure how much of the tracer is absorbed by or distributed to various areas of your heart. LET [facility] CARE PROVIDER KNOW ABOUT:  Any allergies you have.  All medicines you are taking, including vitamins, herbs, eye drops, creams, and over-the-counter medicines.  Previous problems you or members of your family have had with the use of anesthetics.  Any blood disorders you have.  Previous surgeries you have had.  Medical conditions you have.  RISKS AND COMPLICATIONS Generally, this is a safe procedure. However, as with any procedure, problems can occur. Possible problems include:   Serious chest pain.  Rapid heartbeat.  Sensation of warmth in your chest. This usually passes quickly. BEFORE THE  PROCEDURE Ask your health care provider about changing or stopping your regular medicines. PROCEDURE This procedure is usually done at a hospital and takes 2-4 hours.  An IV tube is inserted into one of your veins.  Your health care provider will inject a small amount of radioactive tracer through the tube.  You will then wait for 20-40 minutes while the tracer travels through your bloodstream.  You will lie down on an exam table so images of your heart can be taken. Images will be taken for about 15-20 minutes.  You will exercise on a treadmill or stationary bike. While you exercise, your heart activity will be monitored with an electrocardiogram (ECG), and your blood pressure will be checked.  If you are unable to exercise, you may be given a medicine to make your heart beat faster.  When blood flow to your heart has peaked, tracer will again be injected through the IV tube.  After 20-40 minutes, you will get back on the exam table and have more images taken of your heart.  When the procedure is over, your IV tube will be removed. AFTER THE PROCEDURE  You will likely be able to leave shortly after the test. Unless your health care provider tells you otherwise, you may return to your normal schedule, including diet, activities, and medicines.  Make sure you find out how and when you will get your test results.   This information is not intended to replace advice given to you by your health care provider. Make sure you discuss any questions you have with your health care provider.   Document Released: 06/05/2004 Document Revised: 05/16/2013 Document Reviewed: 04/19/2013 Elsevier Interactive Patient Education [employer].

## 2015-08-05 NOTE — Progress Notes (Signed)
Cardiology Office Note   Date:  08/05/2015   ID:  Seth Hardy, DOB August 21, 1945, MRN [id number]  PCP:  Rusty Aus, MD  Cardiologist:   Kathlyn Sacramento, MD   Chief Complaint  Patient presents with  . other    Pt. is scheduled for carotid surgery with Dr. Ronalee Belts this Friday. Needs to come off coumadin 3 days prior.  Meds reviewed by the patient verbally.       History of Present Illness: Seth Hardy is a 70 y.o. male who presents for evaluation of exertional chest pain and shortness of breath before carotid surgery which is planned for later this week.  He has a history of coronary artery disease and is status post bypass grafting in September 2011 with subsequent bare metal stenting of the right coronary artery in 2013. He also has a history of persistent atrial fibrillation maintained in sinus rhythm on small dose amiodarone and is chronically anticoagulated on Coumadin. He has other chronic medical conditions that include aortic stenosis, hypertension, hyperlipidemia, bilateral carotid arterial disease, and diabetes.   He was found to have significant bilateral carotid stenosis on CT scan and has been seen by Dr.Schneir with plans for left carotid endarterectomy on Friday. Most recent echocardiogram was done at [facility] which showed normal LV systolic function with mild to moderate aortic stenosis. Aortic valve mean gradient was 18 mmHg. The patient was seen last month and was clear to have the surgery done at an overall moderate risk. He went to a trip to [facility] and while he was there he had to walk a significant distance from the parking lot to the stadium. He noticed substernal chest tightness and shortness of breath which lasted for a few minutes and resolved with stress. When he returned back, he had some other recurrent symptoms which were milder in nature. His wife reports that his functional capacity has decreased since his last visit. The discomfort usually does not  last for more than 2 minutes and he has not had to use nitroglycerin. Otherwise there is no orthopnea, PND or leg edema. There has been no weight gain. He does report increased burping but this has not been exertional.    Past Medical History  Diagnosis Date  . Hypertensive heart disease   . Hyperlipidemia   . Diabetes mellitus type II, controlled ([place])     a. Variable CBG 02/2012 - several meds adjusted.  . Mild aortic stenosis     a. 03/2011 Echo: EF 55-60%, Mild AS. b. Mild by cath 02/2012; c. 01/2014 Echo: EF 65-70%, Gr 1 DD, mild AS, mildly dil LA.  . Obesity   . GERD (gastroesophageal reflux disease)   . PAF (paroxysmal atrial fibrillation) ([place])     a. Newly dx 02/2012 & initiated on Coumadin (spont converted to NSR).  Marland Kitchen CAD (coronary artery disease)     a. 01/2010 CABG x 4: LIMA->LAD, VG->D1, VG->OM1, VG->PDA. b. NSTEMI 02/2012 in setting of AF-RVR with cath s/p BMS to RCA (plan for 1 month, possibly up to 3 months of Plavix)  . Carotid disease, bilateral ([place])     a. 03/2011 U/S: 40-59% bilat Carotid dzs;  b. 04/2015 Carotid U/S: 40-59% bilat ICA stenosis; c. 05/2015 CTA Neck: [id number] RICA, [id number] LICA, mod-marked R vertebral stenosis, mod L vertebral stenosis.  . Myocardial infarction ([place]) 2014  . Hypertension   . Shortness of breath dyspnea   . Cancer (HCC)     Basal Cell  .  Sleep apnea     CPAP  . Chronic kidney disease     Past Surgical History  Procedure Laterality Date  . Cyst l kidney  [place]  . Angioplasty    . Cardiac catheterization    . Left heart catheterization with coronary angiogram N/A 03/09/2012    Procedure: LEFT HEART CATHETERIZATION WITH CORONARY ANGIOGRAM;  Surgeon: Wellington Hampshire, MD;  Location: [facility] CATH LAB;  Service: Cardiovascular;  Laterality: N/A;  . Percutaneous coronary stent intervention (pci-s)  03/09/2012    Procedure: PERCUTANEOUS CORONARY STENT INTERVENTION (PCI-S);  Surgeon: Wellington Hampshire, MD;  Location: [facility] CATH LAB;   Service: Cardiovascular;;  . Cataract extraction Bilateral   . Coronary artery bypass graft  2011    [facility], [place], [place],   . Coronary angioplasty      Dr. Fletcher Anon  . Eye surgery Bilateral     Cataract Extraction with IOL  . Hernia repair      Right Inguinal Hernia Repair     Current Outpatient Prescriptions  Medication Sig Dispense Refill  . acetaminophen (TYLENOL) 325 MG tablet Take 650 mg by mouth every 6 (six) hours as needed. For pain    . amiodarone (PACERONE) 200 MG tablet Take 0.5 tablets (100 mg total) by mouth daily. (Patient taking differently: Take 100 mg by mouth at bedtime. ) 45 tablet 3  . amLODipine (NORVASC) 5 MG tablet Take one tablet by mouth one time daily 90 tablet 3  . aspirin 81 MG EC tablet Take 81 mg by mouth daily.      Marland Kitchen atorvastatin (LIPITOR) 20 MG tablet Take 1 tablet (20 mg total) by mouth at bedtime. 90 tablet 3  . furosemide (LASIX) 20 MG tablet Take 1 tablet (20 mg total) by mouth as needed. 30 tablet 6  . glimepiride (AMARYL) 4 MG tablet Take 4 mg by mouth 2 (two) times daily.      . insulin glargine (LANTUS) 100 UNIT/ML injection Inject 20 Units into the skin daily.     . insulin lispro (HUMALOG) 100 UNIT/ML injection Inject 8 Units into the skin 2 (two) times daily with a meal. (Patient taking differently: Inject into the skin 3 (three) times daily. Per sliding scale.)    . metFORMIN (GLUCOPHAGE) 500 MG tablet Take 500 mg by mouth 2 (two) times daily with a meal.    . nitroGLYCERIN (NITROSTAT) 0.4 MG SL tablet Place 1 tablet (0.4 mg total) under the tongue every 5 (five) minutes as needed (up to 3 doses). 25 tablet 1  . Omega-3 Fatty Acids (FISH OIL) 1200 MG CAPS Take 1 capsule by mouth daily.      . pantoprazole (PROTONIX) 40 MG tablet Take 1 tablet (40 mg total) by mouth daily. (Patient taking differently: Take 40 mg by mouth 2 (two) times daily. ) 90 tablet 3  . ramipril (ALTACE) 10 MG capsule Take 1 capsule (10 mg total) by mouth daily. 90 capsule 5    . vitamin C (ASCORBIC ACID) 500 MG tablet Take 500 mg by mouth daily.      Marland Kitchen warfarin (COUMADIN) 5 MG tablet Take as directed by anticoagulation clinic 90 tablet 1   No current facility-administered medications for this visit.    Allergies:   Review of patient's allergies indicates no known allergies.    Social History:  The patient  reports that he has never smoked. He has never used smokeless tobacco. He reports that he drinks alcohol. He reports  that he does not use illicit drugs.   Family History:  [name] family history includes COPD in his mother; Other in his father.    ROS:  Please see the history of present illness.   Otherwise, review of systems are positive for none.   All other systems are reviewed and negative.    PHYSICAL EXAM: VS:  BP 140/62 mmHg  Pulse 60  Ht 5\' 8"  (1.727 m)  Wt 188 lb 12 oz (85.616 kg)  BMI 28.71 kg/m2 , BMI Body mass index is 28.71 kg/(m^2). GEN: Well nourished, well developed, in no acute distress HEENT: normal Neck: no JVD, carotid bruits, or masses Cardiac: RRR; no rubs, or gallops,no edema . There is a 3/6 crescendo decrescendo systolic murmur in the aortic area which is mid peaking. The murmur radiates to the carotid arteries. Respiratory:  clear to auscultation bilaterally, normal work of breathing GI: soft, nontender, nondistended, + BS MS: no deformity or atrophy Skin: warm and dry, no rash Neuro:  Strength and sensation are intact Psych: euthymic mood, full affect   EKG:  EKG is not ordered today.    Recent Labs: 06/25/2015: ALT 33 07/25/2015: BUN 14; Creatinine, Ser 1.03; Hemoglobin 8.8*; Platelets 226; Potassium 4.1; Sodium 139    Lipid Panel    Component Value Date/Time   CHOL 99 03/09/2012 0243   TRIG 84 03/09/2012 0243   HDL 37* 03/09/2012 0243   CHOLHDL 2.7 03/09/2012 0243   VLDL 17 03/09/2012 0243   LDLCALC 45 03/09/2012 0243      Wt Readings from Last 3 Encounters:  08/05/15 188 lb 12 oz (85.616 kg)   07/25/15 190 lb (86.183 kg)  06/27/15 191 lb 12 oz (86.977 kg)        ASSESSMENT AND PLAN:  1.  Coronary artery disease involving bypass graft with stable angina: The patient reports some recent exertional substernal chest tightness which is only happening with over exertion and is suggestive of class II angina. This is new since his last visit. He is scheduled for carotid endarterectomy later this week. I requested a pharmacologic nuclear stress test to be done tomorrow. If the stress test is low risk, he can still proceed with surgery at an overall moderate risk given the presence of moderate aortic stenosis. If the stress test is abnormal, then we'll plan on proceeding with cardiac catheterization before carotid endarterectomy.  2. Bilateral carotid artery stenosis: Asymptomatic. The patient is supposed to have carotid endarterectomy later this week.  3. Moderate aortic stenosis: There was some progression on his echo from January of this year compared to 2015 but the stenosis is still in the moderate range.  4. Persistent atrial fibrillation: Maintaining in sinus rhythm with small dose amiodarone. He is on long-term anticoagulation with warfarin which can be held 3-5 days before surgery.   5. Hyperlipidemia: Continue treatment with atorvastatin with a target LDL of less than 70.    Disposition:   FU with me in 1 month  Signed,  Kathlyn Sacramento, MD  08/05/2015 9:59 AM    [place]

## 2015-08-06 ENCOUNTER — Ambulatory Visit
Admission: RE | Admit: 2015-08-06 | Discharge: 2015-08-06 | Disposition: A | Discharge status: Home / Self Care | Payer: PPO | Source: Ambulatory Visit | Attending: Cardiovascular Disease | Admitting: Cardiovascular Disease

## 2015-08-06 DIAGNOSIS — R079 Chest pain, unspecified: Secondary | ICD-10-CM

## 2015-08-06 DIAGNOSIS — I259 Chronic ischemic heart disease, unspecified: Secondary | ICD-10-CM | POA: Diagnosis not present

## 2015-08-06 LAB — NM MYOCAR MULTI W/SPECT W/WALL MOTION / EF
Estimated workload: 1 METS
Exercise duration (min): 0 min
Exercise duration (sec): 0 s
LV dias vol: 121 mL (ref 62–150)
LV sys vol: 37 mL
MPHR: 151 {beats}/min
Peak HR: 75 {beats}/min
Percent HR: 49 %
Rest HR: 58 {beats}/min
SDS: 9
SRS: 3
SSS: 12
TID: 1.06

## 2015-08-06 MED ORDER — REGADENOSON 0.4 MG/5ML IV SOLN
0.4000 mg | Freq: Once | INTRAVENOUS | Status: AC
  Administered 2015-08-06: 0.4 mg via INTRAVENOUS

## 2015-08-06 MED ORDER — TECHNETIUM TC 99M SESTAMIBI - CARDIOLITE
31.4800 | Freq: Once | INTRAVENOUS | Status: AC | PRN
  Administered 2015-08-06: 10:00:00 31.48 via INTRAVENOUS

## 2015-08-06 MED ORDER — TECHNETIUM TC 99M SESTAMIBI - CARDIOLITE
14.2900 | Freq: Once | INTRAVENOUS | Status: AC | PRN
  Administered 2015-08-06: 08:00:00 14.29 via INTRAVENOUS

## 2015-08-07 ENCOUNTER — Telehealth: Payer: Self-pay | Admitting: Cardiovascular Disease

## 2015-08-07 NOTE — Telephone Encounter (Signed)
(  addendum to previous note) Wife called as she wanted to review information regarding labs and need to hold Friday surgery. Wife states pt had bleeding ulcer "years" ago. He was given several units of blood.  She is concerned anemia is related to the ulcer and reports he has been staggering and unsteady on his feet recently. Per wife, patient has appt Monday, March 20 w/Dr. Sabra Heck.  He has gone to [facility] now to notify them of need to put surgery on hold.  Per Danae Chen, pt should resume coumadin and keep same coumadin recheck date of March 29 Reviewed labs and test results w/wife who verbalized understanding with no further questions at this time.

## 2015-08-07 NOTE — Telephone Encounter (Signed)
S/w pt wife who called with additional questions regarding  Wife states

## 2015-08-12 DIAGNOSIS — D649 Anemia, unspecified: Secondary | ICD-10-CM | POA: Diagnosis not present

## 2015-08-12 DIAGNOSIS — R5383 Other fatigue: Secondary | ICD-10-CM | POA: Diagnosis not present

## 2015-08-12 DIAGNOSIS — E538 Deficiency of other specified B group vitamins: Secondary | ICD-10-CM | POA: Diagnosis not present

## 2015-08-12 DIAGNOSIS — K922 Gastrointestinal hemorrhage, unspecified: Secondary | ICD-10-CM | POA: Diagnosis not present

## 2015-08-19 ENCOUNTER — Telehealth: Payer: Self-pay | Admitting: Cardiovascular Disease

## 2015-08-19 DIAGNOSIS — R5383 Other fatigue: Secondary | ICD-10-CM | POA: Diagnosis not present

## 2015-08-19 DIAGNOSIS — E113293 Type 2 diabetes mellitus with mild nonproliferative diabetic retinopathy without macular edema, bilateral: Secondary | ICD-10-CM | POA: Diagnosis not present

## 2015-08-19 DIAGNOSIS — E538 Deficiency of other specified B group vitamins: Secondary | ICD-10-CM | POA: Insufficient documentation

## 2015-08-19 DIAGNOSIS — D649 Anemia, unspecified: Secondary | ICD-10-CM | POA: Diagnosis not present

## 2015-08-19 DIAGNOSIS — Z794 Long term (current) use of insulin: Secondary | ICD-10-CM | POA: Diagnosis not present

## 2015-08-19 DIAGNOSIS — E1165 Type 2 diabetes mellitus with hyperglycemia: Secondary | ICD-10-CM | POA: Diagnosis not present

## 2015-08-19 NOTE — Telephone Encounter (Signed)
Spoke w/ pt.  Advised him of Ryan's recommendation.  He has an appt w/ Dr. Sabra Heck next Monday. Advised him to give his office a call to see if he can be seen sooner.  He was started on iron, Vit C & B12, he was told that he needs a colonoscopy and/or endoscopy, but they want to get his blood count up before they sched anything. He will call back to their office, his Hgb continues to trend downward, reports PCP told him it was 8.2 last week.

## 2015-08-19 NOTE — Telephone Encounter (Signed)
Pt calling stating he got his stress test results back and we said he is fine  But he seems to think that the issues he is having is with his heart. We have sent him to Dr. Ronalee Belts office but is does not seem like pt is willing to go to him. States he was not able to do endoscopy for his blood count was too low.  Thinks that his heart is the issue not his Carotid. Would like [place] to re-read the stress test and give him a call Stated to him Dr Fletcher Anon is not in office this week He states he is having "real SOB and not pains but discomfort in chest"  Not at the moment but it comes and goes.  Pt did not say for how long, just kept asking for nurse to call him he would explain it to her.

## 2015-08-19 NOTE — Telephone Encounter (Signed)
Patient will be SOB with a hgb of 8.8 as hgb carries oxygen. Recommend he follow up with Dr. Fletcher Anon, MD first of next week given his on going symptoms. Also recommend he get his anemia checked out as this certainly is likely playing a role. Follow up sooner if needed.

## 2015-08-20 ENCOUNTER — Other Ambulatory Visit: Payer: Self-pay | Admitting: Internal Medicine

## 2015-08-20 ENCOUNTER — Ambulatory Visit
Admission: RE | Admit: 2015-08-20 | Discharge: 2015-08-20 | Disposition: A | Discharge status: Home / Self Care | Payer: PPO | Source: Ambulatory Visit | Attending: Internal Medicine | Admitting: Internal Medicine

## 2015-08-20 DIAGNOSIS — R911 Solitary pulmonary nodule: Secondary | ICD-10-CM | POA: Diagnosis not present

## 2015-08-20 DIAGNOSIS — D649 Anemia, unspecified: Secondary | ICD-10-CM | POA: Insufficient documentation

## 2015-08-20 DIAGNOSIS — J90 Pleural effusion, not elsewhere classified: Secondary | ICD-10-CM | POA: Insufficient documentation

## 2015-08-20 DIAGNOSIS — R590 Localized enlarged lymph nodes: Secondary | ICD-10-CM | POA: Insufficient documentation

## 2015-08-20 DIAGNOSIS — N400 Enlarged prostate without lower urinary tract symptoms: Secondary | ICD-10-CM | POA: Insufficient documentation

## 2015-08-20 DIAGNOSIS — N3289 Other specified disorders of bladder: Secondary | ICD-10-CM | POA: Diagnosis not present

## 2015-08-20 HISTORY — DX: Basal cell carcinoma of skin, unspecified: C44.91

## 2015-08-20 MED ORDER — IOPAMIDOL (ISOVUE-300) INJECTION 61%
100.0000 mL | Freq: Once | INTRAVENOUS | Status: AC | PRN
  Administered 2015-08-20: 100 mL via INTRAVENOUS

## 2015-08-20 NOTE — Pre-Procedure Instructions (Signed)
H&P will be outdated by day of surgery on 08/30/15.  H&P has date of 07/25/15.  Need new H&P.  Faxed notice to Dr. Nino Parsley office.

## 2015-08-21 ENCOUNTER — Other Ambulatory Visit: Payer: Self-pay | Admitting: Internal Medicine

## 2015-08-21 DIAGNOSIS — G4733 Obstructive sleep apnea (adult) (pediatric): Secondary | ICD-10-CM | POA: Diagnosis not present

## 2015-08-21 DIAGNOSIS — D500 Iron deficiency anemia secondary to blood loss (chronic): Secondary | ICD-10-CM | POA: Insufficient documentation

## 2015-08-21 DIAGNOSIS — D508 Other iron deficiency anemias: Secondary | ICD-10-CM

## 2015-08-21 DIAGNOSIS — R0689 Other abnormalities of breathing: Secondary | ICD-10-CM | POA: Diagnosis not present

## 2015-08-21 DIAGNOSIS — I2510 Atherosclerotic heart disease of native coronary artery without angina pectoris: Secondary | ICD-10-CM | POA: Diagnosis not present

## 2015-08-21 DIAGNOSIS — D649 Anemia, unspecified: Secondary | ICD-10-CM | POA: Diagnosis not present

## 2015-08-21 DIAGNOSIS — R011 Cardiac murmur, unspecified: Secondary | ICD-10-CM | POA: Diagnosis not present

## 2015-08-22 ENCOUNTER — Encounter: Payer: Self-pay | Admitting: Physician Assistant

## 2015-08-22 ENCOUNTER — Other Ambulatory Visit: Payer: Self-pay | Admitting: Internal Medicine

## 2015-08-22 ENCOUNTER — Other Ambulatory Visit (INDEPENDENT_AMBULATORY_CARE_PROVIDER_SITE_OTHER): Payer: PPO

## 2015-08-22 ENCOUNTER — Ambulatory Visit (INDEPENDENT_AMBULATORY_CARE_PROVIDER_SITE_OTHER): Payer: PPO | Admitting: Physician Assistant

## 2015-08-22 VITALS — BP 122/48 | HR 60 | Ht 66.0 in | Wt 190.0 lb

## 2015-08-22 DIAGNOSIS — Z8711 Personal history of peptic ulcer disease: Secondary | ICD-10-CM

## 2015-08-22 DIAGNOSIS — D509 Iron deficiency anemia, unspecified: Secondary | ICD-10-CM

## 2015-08-22 DIAGNOSIS — Z8719 Personal history of other diseases of the digestive system: Secondary | ICD-10-CM

## 2015-08-22 DIAGNOSIS — R531 Weakness: Secondary | ICD-10-CM | POA: Diagnosis not present

## 2015-08-22 DIAGNOSIS — Z7901 Long term (current) use of anticoagulants: Secondary | ICD-10-CM

## 2015-08-22 DIAGNOSIS — D62 Acute posthemorrhagic anemia: Secondary | ICD-10-CM

## 2015-08-22 LAB — CBC WITH DIFFERENTIAL/PLATELET
Basophils Absolute: 0 10*3/uL (ref 0.0–0.1)
Basophils Relative: 0.2 % (ref 0.0–3.0)
Eosinophils Absolute: 0.1 10*3/uL (ref 0.0–0.7)
Eosinophils Relative: 2.8 % (ref 0.0–5.0)
HCT: 26.2 % — ABNORMAL LOW (ref 39.0–52.0)
Hemoglobin: 8 g/dL — CL (ref 13.0–17.0)
Lymphocytes Relative: 25 % (ref 12.0–46.0)
Lymphs Abs: 1.1 10*3/uL (ref 0.7–4.0)
MCHC: 30.3 g/dL (ref 30.0–36.0)
MCV: 67.4 fl — ABNORMAL LOW (ref 78.0–100.0)
Monocytes Absolute: 0.8 10*3/uL (ref 0.1–1.0)
Monocytes Relative: 16.9 % — ABNORMAL HIGH (ref 3.0–12.0)
Neutro Abs: 2.5 10*3/uL (ref 1.4–7.7)
Neutrophils Relative %: 55.1 % (ref 43.0–77.0)
Platelets: 264 10*3/uL (ref 150.0–400.0)
RBC: 3.89 Mil/uL — ABNORMAL LOW (ref 4.22–5.81)
RDW: 18.5 % — ABNORMAL HIGH (ref 11.5–15.5)
WBC: 4.5 10*3/uL (ref 4.0–10.5)

## 2015-08-22 NOTE — Progress Notes (Signed)
Patient ID: Seth Hardy, male   DOB: 1946-01-24, 70 y.o.   MRN: [id number]   Subjective:    Patient ID: Seth Hardy, male    DOB: [birth year], 70 y.o.   MRN: [id number]  HPI [name] is a pleasant 70 year old white male referred today by Dr. Emily Filbert, PCP in [facility]. Patient is known to Dr. Carlean Purl from remote procedures. He is being referred for iron deficiency anemia with drifting hemoglobin.  Patient also has history of insulin-dependent diabetes mellitus, coronary artery disease status post stent an MI 2014 history of atrial fibrillation for which he is on Coumadin and recently diagnosed severe carotid stenosis on the left. He has undergone evaluation and is scheduled for carotid surgery on Friday, 08/30/2015 with a Dr. Delana Meyer, in [place]. His cardiologist is Dr. Earlie Server [name]/ [place]. Patient has been complaining of fatigue and exertional dyspnea over the past month. He has also undergone recent cardiac eval with stress test a couple of weeks ago because of some chest tightness also associated. Is stress test was negative. Review of labs shows hemoglobin of 9.6 in January 2017 MCV of 74. Hemoglobin on 07/25/2015 8.8 hematocrit of 27.8 INR 2.4. Hemoglobin earlier this week 7.9. Patient and his wife tell me that he was told he was iron deficient and B12 deficient though I do not have those results. He says he is set up for an iron infusion next week. His wife is very concerned about his symptoms and his continued drop in hemoglobin and would very much like him to have transfusions as soon as possible.  CT of the abdomen and pelvis  done on 08/20/2015 which showed small bilateral effusions, status post CABG, nonspecific mediastinal adenopathy and a 2 mm pulmonary nodule, abdomen negative.  Patient has no complaints of abdominal pain. He has had some intermittent nausea especially in the mornings and had one episode of vomiting earlier this week without any heme. His  appetite is been fine and weight has been stable no dysphagia or odynophagia. No melena ,hematochezia or changes in his bowel habits. Was hemocculted per his PCP and was heme negative last week. Again he is maintained on Coumadin and baby aspirin no other NSAIDs. Last colonoscopy done in June 2006 was a normal exam EGD 2006 per Dr. Carlean Purl showed 3 gastric ulcers clean-based, at the angularis and duodenitis.  Patient and wife tell me that the vascular surgeon has told him he would not do his surgery with his hemoglobin as low  as it is.       Review of Systems Pertinent positive and negative review of systems were noted in the above HPI section.  All other review of systems was otherwise negative.  Outpatient Encounter Prescriptions as of 08/22/2015  Medication Sig  . acetaminophen (TYLENOL) 325 MG tablet Take 650 mg by mouth every 6 (six) hours as needed. For pain  . amiodarone (PACERONE) 200 MG tablet Take 0.5 tablets (100 mg total) by mouth daily. (Patient taking differently: Take 100 mg by mouth at bedtime. )  . amLODipine (NORVASC) 5 MG tablet Take one tablet by mouth one time daily  . aspirin 81 MG EC tablet Take 81 mg by mouth daily.    Marland Kitchen atorvastatin (LIPITOR) 20 MG tablet Take 1 tablet (20 mg total) by mouth at bedtime.  . ferrous sulfate 325 (65 FE) MG tablet Take 325 mg by mouth 2 (two) times daily with a meal.  . furosemide (LASIX) 20 MG tablet Take 1 tablet (20  mg total) by mouth as needed.  Marland Kitchen glimepiride (AMARYL) 4 MG tablet Take 4 mg by mouth 2 (two) times daily.    . insulin glargine (LANTUS) 100 UNIT/ML injection Inject 20 Units into the skin daily.   . insulin lispro (HUMALOG) 100 UNIT/ML injection Inject 8 Units into the skin 2 (two) times daily with a meal. (Patient taking differently: Inject into the skin 3 (three) times daily. Per sliding scale.)  . IRON-VITAMIN C PO Take 1 tablet by mouth daily.  . metFORMIN (GLUCOPHAGE) 500 MG tablet Take 500 mg by mouth 2 (two) times  daily with a meal.  . nitroGLYCERIN (NITROSTAT) 0.4 MG SL tablet Place 1 tablet (0.4 mg total) under the tongue every 5 (five) minutes as needed (up to 3 doses).  . Omega-3 Fatty Acids (FISH OIL) 1200 MG CAPS Take 1 capsule by mouth daily.    . OXYGEN Inhale 2.5 L/min into the lungs at bedtime. With CPAP  . pantoprazole (PROTONIX) 40 MG tablet Take 1 tablet (40 mg total) by mouth daily. (Patient taking differently: Take 40 mg by mouth 2 (two) times daily. )  . ramipril (ALTACE) 10 MG capsule Take 1 capsule (10 mg total) by mouth daily.  . vitamin B-12 (CYANOCOBALAMIN) 1000 MCG tablet Take 1,000 mcg by mouth daily.  Marland Kitchen warfarin (COUMADIN) 5 MG tablet Take as directed by anticoagulation clinic  . [DISCONTINUED] vitamin C (ASCORBIC ACID) 500 MG tablet Take 500 mg by mouth daily.     No facility-administered encounter medications on file as of 08/22/2015.   No Known Allergies Patient Active Problem List   Diagnosis Date Noted  . Other iron deficiency anemias 08/21/2015  . Carotid disease, bilateral ([place])   . CAD (coronary artery disease)   . Mild aortic stenosis   . Hypertensive heart disease   . Diabetes mellitus type II, controlled ([place])   . Hyperlipidemia 03/14/2015  . Atrial fibrillation ([place]) 03/23/2012  . Long term (current) use of anticoagulants 03/14/2012  . Non-STEMI (non-ST elevated myocardial infarction) ([place]) 03/09/2012  . Carotid stenosis 05/28/2010  . CAD, ARTERY BYPASS GRAFT 04/07/2010  . CAD, NATIVE VESSEL 03/13/2009  . Aortic valve stenosis, mild 11/01/2008   Social History   Social History  . Marital Status: Married    Spouse Name: N/A  . Number of Children: 2  . Years of Education: N/A   Occupational History  . retired    Social History Main Topics  . Smoking status: Never Smoker   . Smokeless tobacco: Never Used     Comment: tobacco use - no  . Alcohol Use: Yes     Comment: rare drink  . Drug Use: No  . Sexual Activity: Yes   Other Topics Concern  .  Not on file   Social History Narrative   Full time. Gets regular exercise. Lives in [place] with his wife.  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    Mr. Gauna family history includes Bladder Cancer in his mother; COPD in his mother; Cancer in his other; Heart attack in his father; Kidney disease in his paternal uncle; Liver cancer in his maternal aunt; Lung cancer in his maternal aunt.      Objective:    Filed Vitals:   08/22/15 1351  BP: 122/48  Pulse: 60    Physical Exam   well-developed older white male in no acute distress, pleasant, accompanied by his wife blood pressure 122/48 pulse 60 height 5 foot 6 weight 190. HEENT; nontraumatic normocephalic EOMI PERRLA, Cardiovascular; regular  rate and rhythm with [id number], soft systolic murmur, Pulmonary; clear bilaterally, Abdomen ;soft nontender nondistended bowel sounds are active no palpable mass or hepatosplenomegaly, Rectal ;exam stool brown and Hemoccult negative, Extremities ;no clubbing cyanosis or edema skin warm and dry, Neuropsych; mood and affect appropriate    Assessment & Plan:   #2 70 year old white male with iron deficiency anemia and drifting hemoglobin, not actively bleeding with Hemoccult negative stool last week and again today. Etiology is not clear, will need to rule out occult intermittent GI blood loss. Rule out recurrent peptic ulcer disease, AVMs, occult neoplasm #2 dyspnea and fatigue secondary to anemia #3 coronary artery disease status post CABG and stents #4 atrial fibrillation #5  chronic anticoagulation-on Coumadin #6 insulin-dependent diabetes mellitus #7 cm carotid stenosis left-land for surgery on April 7 [place] #8 sleep apnea, with nocturnal O2 use  Plan; repeat hemoglobin today Have requested iron studies and B12 results from his PCP Dr. Sabra Heck Have arranged for patient to be transfused 2 units of packed RBCs tomorrow/Lasix 20  between units  Patient will go ahead with planned iron  infusion next week and [place] arrange per his PCP Patient is to contact his vascular surgeon, to update them about transfusions, and hopefully can go ahead with his carotid endarterectomy next Friday ,4/7 / 2017  We have scheduled patient for EGD and colonoscopy with Dr. Carlean Purl in about 6 weeks this will be done at the hospital due to nighttime oxygen use. Hopefully this will enough time for him to recuperate from carotid repair. Procedures were discussed in detail with patient and his wife and they're agreeable to proceed. Patient will also need to come off of Coumadin for 5 days prior to his procedure and we will communicate with his cardiologist Dr Fletcher Anon ,to assure this is acceptable for this patient He continue Protonix 40 mg by mouth every morning. He will need serial hemoglobins in the interim   Alfredia Ferguson PA-C 08/22/2015   Cc: Rusty Aus, MD

## 2015-08-22 NOTE — Patient Instructions (Addendum)
Please go to the basement level to have your labs drawn.  We will call you once we hear from Dr. Fletcher Anon regarding the Coumadin.  You have been scheduled for an endoscopy and colonoscopy. Please follow the written instructions given to you at your visit today. Please pick up your prep supplies at the pharmacy within the next 1-3 days. If you use inhalers (even only as needed), please bring them with you on the day of your procedure. Your physician has requested that you go to www.startemmi.com and enter the access code given to you at your visit today. This web site gives a general overview about your procedure. However, you should still follow specific instructions given to you by our office regarding your preparation for the procedure.   For the blood tranfusion: You have an appointment for tomorrow 08-23-2015 at the [facility] building [facility] . Arrive at 11:00 am.  4th floor of the building.  Your nurse             If you are age 57 or older, your body mass index should be between 23-30. Your Body mass index is 30.68 kg/(m^2). If this is out of the aforementioned range listed, please consider follow up with your Primary Care Provider.

## 2015-08-23 ENCOUNTER — Ambulatory Visit (HOSPITAL_COMMUNITY)
Admission: RE | Admit: 2015-08-23 | Discharge: 2015-08-23 | Disposition: A | Discharge status: Home / Self Care | Payer: PPO | Source: Ambulatory Visit | Attending: Internal Medicine | Admitting: Internal Medicine

## 2015-08-23 ENCOUNTER — Other Ambulatory Visit: Payer: Self-pay | Admitting: *Deleted

## 2015-08-23 ENCOUNTER — Telehealth: Payer: Self-pay | Admitting: *Deleted

## 2015-08-23 VITALS — BP 143/50 | HR 55 | Temp 98.3°F | Resp 18

## 2015-08-23 DIAGNOSIS — D62 Acute posthemorrhagic anemia: Secondary | ICD-10-CM | POA: Diagnosis not present

## 2015-08-23 LAB — PREPARE RBC (CROSSMATCH)

## 2015-08-23 LAB — ABO/RH: ABO/RH(D): O POS

## 2015-08-23 MED ORDER — FUROSEMIDE 10 MG/ML IJ SOLN
INTRAMUSCULAR | Status: AC
  Administered 2015-08-23: 20 mg via INTRAVENOUS
  Filled 2015-08-23: qty 2

## 2015-08-23 MED ORDER — SODIUM CHLORIDE 0.9 % IV SOLN
Freq: Once | INTRAVENOUS | Status: AC
  Administered 2015-08-23: 13:00:00 via INTRAVENOUS

## 2015-08-23 MED ORDER — FUROSEMIDE 10 MG/ML IJ SOLN
20.0000 mg | Freq: Once | INTRAMUSCULAR | Status: AC
  Administered 2015-08-23: 20 mg via INTRAVENOUS
  Filled 2015-08-23: qty 2

## 2015-08-23 MED ORDER — FUROSEMIDE 10 MG/ML IJ SOLN
20.0000 mg | Freq: Once | INTRAMUSCULAR | Status: AC
  Administered 2015-08-23: 20 mg via INTRAVENOUS

## 2015-08-23 NOTE — Procedures (Signed)
[facility]  Procedure Note  Seth Hardy [id number] DOB: [birth year] DOA: 08/23/2015    PCP: Rusty Aus, MD  Ordering Provider: Nicoletta Ba, PA  Associated Diagnosis: Acute blood loss anemia (285.1)  Procedure Note: Transfusion of 2 units PRBCs; IV push lasix times 2   Condition During Procedure: Pt tolerated well   Condition at Discharge:  Pt alert, oriented, ambulatory; no complications noted  Nigel Sloop, [facility]

## 2015-08-23 NOTE — Telephone Encounter (Signed)
Called [facility] and spoke to [facility].  I called to ask for fax number , [phone].Marland Kitchen Faxed Amy Auburn PA's office not to Dr. Hortencia Pilar.

## 2015-08-23 NOTE — Telephone Encounter (Signed)
  08/23/2015   RE: Seth Hardy DOB: December 19, 1945 MRN: [id number]   Dear Dr. Kathlyn Sacramento,    We have scheduled the above patient for an endoscopic procedure. Our records show that he is on anticoagulation therapy.   Please advise as to how long the patient may come off his therapy of Coumadin prior to the procedure, which is scheduled for 10-15-2015 at [facility]. Dr. Silvano Rusk will perform the Colonoscopy and Endoscopy.  Please fax back/ or route the completed form to [place] at [phone].   Sincerely,    Amy Esterwood PA-C

## 2015-08-23 NOTE — Discharge Instructions (Signed)
Blood Transfusion, Care After °Refer to this sheet in the next few weeks. These instructions provide you with information about caring for yourself after your procedure. Your health care provider may also give you more specific instructions. Your treatment has been planned according to current medical practices, but problems sometimes occur. Call your health care provider if you have any problems or questions after your procedure. °WHAT TO EXPECT AFTER THE PROCEDURE °After your procedure, it is common to have: °· Bruising and soreness at the IV site. °· Chills or fever. °· Headache. °HOME CARE INSTRUCTIONS °· Take medicines only as directed by your health care provider. Ask your health care provider if you can take an over-the-counter pain reliever in case you have a fever or headache a day or two after your transfusion. °· Return to your normal activities as directed by your health care provider. °SEEK MEDICAL CARE IF:  °· You develop redness or irritation at your IV site. °· You have persistent fever, chills, or headache. °· Your urine is darker than normal. °· Your urine turns pink, red, or brown.   °· The white part of your eye turns yellow (jaundice).   °· You feel weak after doing your normal activities.   °SEEK IMMEDIATE MEDICAL CARE IF:  °· You have trouble breathing. °· You have fever and chills along with: °¨ Anxiety. °¨ Chest or back pain. °¨ Flushed skin. °¨ Clammy skin. °¨ A rapid heartbeat. °¨ Nausea. °  °This information is not intended to replace advice given to you by your health care provider. Make sure you discuss any questions you have with your health care provider. °  °Document Released: 06/01/2014 Document Reviewed: 06/01/2014 °Elsevier Interactive Patient Education ©2016 Else[employer] Inc. ° °

## 2015-08-23 NOTE — Telephone Encounter (Signed)
Routed Nicoletta Ba PA-C's office note from THursday 08-22-2015 to Dr. Rusty Aus, the patient's primary care physician.

## 2015-08-24 DIAGNOSIS — D509 Iron deficiency anemia, unspecified: Secondary | ICD-10-CM

## 2015-08-24 HISTORY — DX: Iron deficiency anemia, unspecified: D50.9

## 2015-08-26 ENCOUNTER — Ambulatory Visit: Payer: Self-pay | Admitting: Cardiovascular Disease

## 2015-08-26 ENCOUNTER — Telehealth: Payer: Self-pay | Admitting: Cardiovascular Disease

## 2015-08-26 ENCOUNTER — Other Ambulatory Visit: Payer: Self-pay | Admitting: Internal Medicine

## 2015-08-26 ENCOUNTER — Encounter: Payer: Self-pay | Admitting: Internal Medicine

## 2015-08-26 DIAGNOSIS — E113293 Type 2 diabetes mellitus with mild nonproliferative diabetic retinopathy without macular edema, bilateral: Secondary | ICD-10-CM | POA: Diagnosis not present

## 2015-08-26 DIAGNOSIS — D649 Anemia, unspecified: Secondary | ICD-10-CM | POA: Diagnosis not present

## 2015-08-26 DIAGNOSIS — N281 Cyst of kidney, acquired: Secondary | ICD-10-CM

## 2015-08-26 DIAGNOSIS — Z794 Long term (current) use of insulin: Secondary | ICD-10-CM | POA: Diagnosis not present

## 2015-08-26 DIAGNOSIS — E1165 Type 2 diabetes mellitus with hyperglycemia: Secondary | ICD-10-CM | POA: Diagnosis not present

## 2015-08-26 LAB — TYPE AND SCREEN
ABO/RH(D): O POS
Antibody Screen: NEGATIVE
Unit division: 0
Unit division: 0

## 2015-08-26 NOTE — Telephone Encounter (Signed)
Per MD, pt has had cardiac clearance appt and does not need to be seen. S/w pt who verbalized understanding and will f/u PCP for labs post blood transfusion.

## 2015-08-26 NOTE — Telephone Encounter (Signed)
Patient requesting to be worked in for surgical clearance .   Added to opening arida had to day at 2:30 pm.  Wants to get ok from arida to to carotid endarterectomy with Schnier on 08-30-15.

## 2015-08-26 NOTE — Telephone Encounter (Signed)
S/w pt who had requested OV today for cardiac clearance for April 7 endarterectomy w/AV&VS. He was put on schedule for 2:30pm appt. I called pt back and reviewed myoview results from March 14.  Labs were abnormal; Hgb low. Pt instructed to call PCP and postpone surgery.  Endarterectomy rescheduled. Pt states he received 2 pints of blood March 31. Notes state to have labs redrawn x 1 week. Advised pt that based on notes, he will not need to see Dr. Fletcher Anon again to clear him for surgery. He needs to f/u PCP regarding hgb. Pt verbalized understanding with no further questions. Will review with MD

## 2015-08-27 ENCOUNTER — Telehealth: Payer: Self-pay | Admitting: *Deleted

## 2015-08-27 ENCOUNTER — Inpatient Hospital Stay: Payer: PPO | Attending: Internal Medicine | Admitting: Internal Medicine

## 2015-08-27 ENCOUNTER — Inpatient Hospital Stay: Payer: PPO

## 2015-08-27 VITALS — BP 148/70 | HR 50 | Temp 98.0°F | Resp 18

## 2015-08-27 VITALS — BP 158/71 | HR 51 | Temp 96.1°F | Resp 18 | Wt 192.0 lb

## 2015-08-27 DIAGNOSIS — Z8052 Family history of malignant neoplasm of bladder: Secondary | ICD-10-CM | POA: Diagnosis not present

## 2015-08-27 DIAGNOSIS — D509 Iron deficiency anemia, unspecified: Secondary | ICD-10-CM | POA: Diagnosis not present

## 2015-08-27 DIAGNOSIS — E538 Deficiency of other specified B group vitamins: Secondary | ICD-10-CM | POA: Diagnosis not present

## 2015-08-27 DIAGNOSIS — Z7982 Long term (current) use of aspirin: Secondary | ICD-10-CM | POA: Diagnosis not present

## 2015-08-27 DIAGNOSIS — E119 Type 2 diabetes mellitus without complications: Secondary | ICD-10-CM | POA: Insufficient documentation

## 2015-08-27 DIAGNOSIS — Z794 Long term (current) use of insulin: Secondary | ICD-10-CM | POA: Insufficient documentation

## 2015-08-27 DIAGNOSIS — Z7901 Long term (current) use of anticoagulants: Secondary | ICD-10-CM | POA: Insufficient documentation

## 2015-08-27 DIAGNOSIS — Z800 Family history of malignant neoplasm of digestive organs: Secondary | ICD-10-CM | POA: Diagnosis not present

## 2015-08-27 DIAGNOSIS — R5383 Other fatigue: Secondary | ICD-10-CM | POA: Insufficient documentation

## 2015-08-27 DIAGNOSIS — E669 Obesity, unspecified: Secondary | ICD-10-CM | POA: Diagnosis not present

## 2015-08-27 DIAGNOSIS — Z85828 Personal history of other malignant neoplasm of skin: Secondary | ICD-10-CM | POA: Insufficient documentation

## 2015-08-27 DIAGNOSIS — Z79899 Other long term (current) drug therapy: Secondary | ICD-10-CM | POA: Insufficient documentation

## 2015-08-27 DIAGNOSIS — Z809 Family history of malignant neoplasm, unspecified: Secondary | ICD-10-CM | POA: Insufficient documentation

## 2015-08-27 DIAGNOSIS — G4730 Sleep apnea, unspecified: Secondary | ICD-10-CM

## 2015-08-27 DIAGNOSIS — I480 Paroxysmal atrial fibrillation: Secondary | ICD-10-CM | POA: Diagnosis not present

## 2015-08-27 DIAGNOSIS — Z8669 Personal history of other diseases of the nervous system and sense organs: Secondary | ICD-10-CM | POA: Insufficient documentation

## 2015-08-27 DIAGNOSIS — I129 Hypertensive chronic kidney disease with stage 1 through stage 4 chronic kidney disease, or unspecified chronic kidney disease: Secondary | ICD-10-CM | POA: Insufficient documentation

## 2015-08-27 DIAGNOSIS — E785 Hyperlipidemia, unspecified: Secondary | ICD-10-CM | POA: Diagnosis not present

## 2015-08-27 DIAGNOSIS — Z8711 Personal history of peptic ulcer disease: Secondary | ICD-10-CM | POA: Diagnosis not present

## 2015-08-27 DIAGNOSIS — I350 Nonrheumatic aortic (valve) stenosis: Secondary | ICD-10-CM | POA: Diagnosis not present

## 2015-08-27 DIAGNOSIS — I2510 Atherosclerotic heart disease of native coronary artery without angina pectoris: Secondary | ICD-10-CM | POA: Diagnosis not present

## 2015-08-27 DIAGNOSIS — I252 Old myocardial infarction: Secondary | ICD-10-CM | POA: Insufficient documentation

## 2015-08-27 DIAGNOSIS — N189 Chronic kidney disease, unspecified: Secondary | ICD-10-CM | POA: Diagnosis not present

## 2015-08-27 DIAGNOSIS — Z801 Family history of malignant neoplasm of trachea, bronchus and lung: Secondary | ICD-10-CM | POA: Diagnosis not present

## 2015-08-27 DIAGNOSIS — I119 Hypertensive heart disease without heart failure: Secondary | ICD-10-CM | POA: Diagnosis not present

## 2015-08-27 DIAGNOSIS — K219 Gastro-esophageal reflux disease without esophagitis: Secondary | ICD-10-CM | POA: Insufficient documentation

## 2015-08-27 DIAGNOSIS — D508 Other iron deficiency anemias: Secondary | ICD-10-CM

## 2015-08-27 MED ORDER — SODIUM CHLORIDE 0.9 % IV SOLN
510.0000 mg | Freq: Once | INTRAVENOUS | Status: AC
  Administered 2015-08-27: 510 mg via INTRAVENOUS
  Filled 2015-08-27: qty 17

## 2015-08-27 MED ORDER — SODIUM CHLORIDE 0.9 % IV SOLN
Freq: Once | INTRAVENOUS | Status: AC
  Administered 2015-08-27: 12:00:00 via INTRAVENOUS
  Filled 2015-08-27: qty 1000

## 2015-08-27 NOTE — Telephone Encounter (Signed)
Informed the patient that Dr. Fletcher Anon responded to my Coumadin clearance letter. He advised the Coumadin can be held for the procedure.  He will be off the coumadin anyway until his anemia is improved.  Also, Mrs. Sanluis got on the phone and wanted to thank [place] all for being so nice and getting his blood transfusion appointment for the next day.  Both the patient and his wife were grateful.  They wanted to thank all who were involved in their visit when they were here THursday, 08-22-2015. She did say they may be calling after his surgery on Friday 08-30-2015 and possibly moving up his appointment for the colonoscopy.

## 2015-08-27 NOTE — Telephone Encounter (Signed)
Coumadin can be held for endoscopy. He is going to be off Coumadin anyway until his anemia is improved.

## 2015-08-27 NOTE — Progress Notes (Signed)
Patient here today as new evaluation regarding anemia.  Referred by Dr. Emily Filbert.  Scheduled for carotid artery surgery on Friday.  Patient states his hgb has been decreased.  Most recently 7.9.  Saw Dr. Carlean Purl in [place] this past week and received 2 units of blood.  Yesterday hgb 10.4.  Ferritin 14 from 8 last week.

## 2015-08-27 NOTE — Progress Notes (Signed)
[facility] OFFICE PROGRESS NOTE  Patient Care Team: Rusty Aus, MD as PCP - General (Internal Medicine)   SUMMARY OF ONCOLOGIC HISTORY:  # MARCH 2017- April 2017- IRON DEFICIENCY ANEMIA ? etiology  # ? TIA/ CEA [April 2017]  # hx of A.fib- on coumadin/ CAD [Dr.[name]]; Hx of gastric ulcer [Dr.Gessner]  History of present illness:   [name] with history of coronary artery disease/history of A. fib on Coumadin noted to have progressive fatigue over the last many weeks. He denies any blood in stools or black stools. Hemoglobin was found to be around 8/also found to have low B12- start on by mouth iron pills/and B12 pills. Over a week or so his hemoglobin continued trend down.   Given the upcoming surgery of the left CEA- he underwent 2 units of PRBC transfusion/through gastroenterology. He is awaiting on colonoscopy endoscopy to be done after his CEA is done which is on April 7. Most recent hemoglobin yesterday was 10.3 as per the family.  The CEA is planned because of "85% occlusion"- as per the family; and the recent history of possible TIA as per family. Otherwise patient denies   abdominal pain and weight loss. No nausea no vomiting. Denies any difficulty swallowing. He denies any chest pain. Shortness of breath on exertion. Otherwise no diarrhea or constipation.  REVIEW OF SYSTEMS:  A complete 10 point review of system is done which is negative except mentioned above/history of present illness.   PAST MEDICAL HISTORY :  Past Medical History  Diagnosis Date  . Hypertensive heart disease   . Hyperlipidemia   . Diabetes mellitus type II, controlled ([place])     a. Variable CBG 02/2012 - several meds adjusted.  . Mild aortic stenosis     a. 03/2011 Echo: EF 55-60%, Mild AS. b. Mild by cath 02/2012; c. 01/2014 Echo: EF 65-70%, Gr 1 DD, mild AS, mildly dil LA.  . Obesity   . GERD (gastroesophageal reflux disease)   . PAF (paroxysmal atrial  fibrillation) ([place])     a. Newly dx 02/2012 & initiated on Coumadin (spont converted to NSR).  Marland Kitchen CAD (coronary artery disease)     a. 01/2010 CABG x 4: LIMA->LAD, VG->D1, VG->OM1, VG->PDA. b. NSTEMI 02/2012 in setting of AF-RVR with cath s/p BMS to RCA (plan for 1 month, possibly up to 3 months of Plavix)  . Carotid disease, bilateral ([place])     a. 03/2011 U/S: 40-59% bilat Carotid dzs;  b. 04/2015 Carotid U/S: 40-59% bilat ICA stenosis; c. 05/2015 CTA Neck: [id number] RICA, [id number] LICA, mod-marked R vertebral stenosis, mod L vertebral stenosis.  . Myocardial infarction ([place]) 2014  . Hypertension   . Shortness of breath dyspnea   . Sleep apnea     CPAP with oxygen  . Chronic kidney disease   . Basal cell carcinoma of skin   . Anemia   . Macular edema bil    PAST SURGICAL HISTORY :   Past Surgical History  Procedure Laterality Date  . Cyst l kidney  [place]  . Angioplasty    . Cardiac catheterization    . Left heart catheterization with coronary angiogram N/A 03/09/2012    Procedure: LEFT HEART CATHETERIZATION WITH CORONARY ANGIOGRAM;  Surgeon: Wellington Hampshire, MD;  Location: [facility] CATH LAB;  Service: Cardiovascular;  Laterality: N/A;  . Percutaneous coronary stent intervention (pci-s)  03/09/2012    Procedure: PERCUTANEOUS CORONARY STENT INTERVENTION (PCI-S);  Surgeon: Wellington Hampshire, MD;  Location: [facility] CATH LAB;  Service: Cardiovascular;;  . Coronary artery bypass graft  2009-09-06    [facility], [place], [place],   . Coronary angioplasty      Dr. Fletcher Anon  . Cataract extraction Bilateral     Cataract Extraction with IOL  . Inguinal hernia repair Right     Right Inguinal Hernia Repair    FAMILY HISTORY :   Family History  Problem Relation Age of Onset  . COPD Mother     alive 1  . Heart attack Father     09-06-13 deceased  . Bladder Cancer Mother   . Lung cancer Maternal Aunt   . Liver cancer Maternal Aunt   . Kidney disease Paternal Uncle   . Cancer Other     all paternal aunts and  uncles    SOCIAL HISTORY:   Social History  Substance Use Topics  . Smoking status: Never Smoker   . Smokeless tobacco: Never Used     Comment: tobacco use - no  . Alcohol Use: Yes     Comment: rare drink    ALLERGIES:  has No Known Allergies.  MEDICATIONS:  Current Outpatient Prescriptions  Medication Sig Dispense Refill  . acetaminophen (TYLENOL) 325 MG tablet Take 650 mg by mouth every 6 (six) hours as needed. For pain    . amiodarone (PACERONE) 200 MG tablet Take 0.5 tablets (100 mg total) by mouth daily. (Patient taking differently: Take 100 mg by mouth at bedtime. ) 45 tablet 3  . amLODipine (NORVASC) 5 MG tablet Take one tablet by mouth one time daily 90 tablet 3  . aspirin 81 MG EC tablet Take 81 mg by mouth daily.      Marland Kitchen atorvastatin (LIPITOR) 20 MG tablet Take 1 tablet (20 mg total) by mouth at bedtime. 90 tablet 3  . furosemide (LASIX) 20 MG tablet Take 1 tablet (20 mg total) by mouth as needed. 30 tablet 6  . glimepiride (AMARYL) 4 MG tablet Take 4 mg by mouth 2 (two) times daily.      . insulin glargine (LANTUS) 100 UNIT/ML injection Inject 20 Units into the skin daily.     . insulin lispro (HUMALOG) 100 UNIT/ML injection Inject 8 Units into the skin 2 (two) times daily with a meal. (Patient taking differently: Inject into the skin 3 (three) times daily. Per sliding scale.)    . metFORMIN (GLUCOPHAGE) 500 MG tablet Take 500 mg by mouth 2 (two) times daily with a meal.    . nitroGLYCERIN (NITROSTAT) 0.4 MG SL tablet Place 1 tablet (0.4 mg total) under the tongue every 5 (five) minutes as needed (up to 3 doses). 25 tablet 1  . OXYGEN Inhale 2.5 L/min into the lungs at bedtime. With CPAP    . pantoprazole (PROTONIX) 40 MG tablet Take 1 tablet (40 mg total) by mouth daily. (Patient taking differently: Take 40 mg by mouth 2 (two) times daily. ) 90 tablet 3  . ramipril (ALTACE) 10 MG capsule Take 1 capsule (10 mg total) by mouth daily. 90 capsule 5  . warfarin (COUMADIN) 5 MG  tablet Take as directed by anticoagulation clinic 90 tablet 1  . IRON-VITAMIN C PO Take 1 tablet by mouth daily. Reported on 08/27/2015    . Omega-3 Fatty Acids (FISH OIL) 1200 MG CAPS Take 1 capsule by mouth daily. Reported on 08/27/2015     No current facility-administered medications for this visit.    PHYSICAL EXAMINATION:  BP 158/71 mmHg  Pulse 51  Temp(Src) 96.1 F (35.6 C)  Resp 18  Wt 192 lb 0.3 oz (87.1 kg)  Filed Weights   08/27/15 1015  Weight: 192 lb 0.3 oz (87.1 kg)    GENERAL: Well-nourished well-developed; Alert, no distress and comfortable.  Family/wife EYES: positive for pallor OROPHARYNX: no thrush or ulceration; good dentition  NECK: supple, no masses felt LYMPH:  no palpable lymphadenopathy in the cervical, axillary or inguinal regions LUNGS: clear to auscultation and  No wheeze or crackles HEART/CVS: regular rate & rhythm and no murmurs; No lower extremity edema ABDOMEN:abdomen soft, non-tender and normal bowel sounds Musculoskeletal:no cyanosis of digits and no clubbing  PSYCH: alert & oriented x 3 with fluent speech NEURO: no focal motor/sensory deficits SKIN:  no rashes or significant lesions  LABORATORY DATA:  I have reviewed the data as listed    Component Value Date/Time   NA 139 07/25/2015 1138   NA 139 01/22/2013 0348   K 4.1 07/25/2015 1138   K 4.2 01/22/2013 0348   CL 103 07/25/2015 1138   CL 106 01/22/2013 0348   CO2 27 07/25/2015 1138   CO2 28 01/22/2013 0348   GLUCOSE 216* 07/25/2015 1138   GLUCOSE 158* 01/22/2013 0348   BUN 14 07/25/2015 1138   BUN 16 01/22/2013 0348   CREATININE 1.03 07/25/2015 1138   CREATININE 1.02 01/22/2013 0348   CALCIUM 9.1 07/25/2015 1138   CALCIUM 8.6 01/22/2013 0348   PROT 7.2 06/25/2015 1045   PROT 7.5 01/21/2013 0209   ALBUMIN 4.1 06/25/2015 1045   ALBUMIN 4.3 01/21/2013 0209   AST 30 06/25/2015 1045   AST 28 01/21/2013 0209   ALT 33 06/25/2015 1045   ALT 36 01/21/2013 0209   ALKPHOS 104  06/25/2015 1045   ALKPHOS 143* 01/21/2013 0209   BILITOT 0.4 06/25/2015 1045   BILITOT 0.4 01/21/2013 0209   GFRNONAA >60 07/25/2015 1138   GFRNONAA >60 01/22/2013 0348   GFRAA >60 07/25/2015 1138   GFRAA >60 01/22/2013 0348    No results found for: SPEP, UPEP  Lab Results  Component Value Date   WBC 4.5 08/22/2015   NEUTROABS 2.5 08/22/2015   HGB 8.0 Repeated and verified X2.* 08/22/2015   HCT 26.2 Repeated and verified X2.* 08/22/2015   MCV 67.4 Repeated and verified X2.* 08/22/2015   PLT 264.0 08/22/2015      Chemistry      Component Value Date/Time   NA 139 07/25/2015 1138   NA 139 01/22/2013 0348   K 4.1 07/25/2015 1138   K 4.2 01/22/2013 0348   CL 103 07/25/2015 1138   CL 106 01/22/2013 0348   CO2 27 07/25/2015 1138   CO2 28 01/22/2013 0348   BUN 14 07/25/2015 1138   BUN 16 01/22/2013 0348   CREATININE 1.03 07/25/2015 1138   CREATININE 1.02 01/22/2013 0348      Component Value Date/Time   CALCIUM 9.1 07/25/2015 1138   CALCIUM 8.6 01/22/2013 0348   ALKPHOS 104 06/25/2015 1045   ALKPHOS 143* 01/21/2013 0209   AST 30 06/25/2015 1045   AST 28 01/21/2013 0209   ALT 33 06/25/2015 1045   ALT 36 01/21/2013 0209   BILITOT 0.4 06/25/2015 1045   BILITOT 0.4 01/21/2013 0209        ASSESSMENT & PLAN:   # Severe anemia- iron deficient- Unclear etiology-however this appears to be chronic/no evidence of acute GI bleed. Patient's most recent ferritin [id number] 239-lOW/folic acid 17 hemoglobin 8.2 MCV  73 reticulocyte count 1.66/normal. CT chest abdomen pelvis- shows no clear etiology of his iron deficiency anemia.   # Recommend IV iron Feraheme - weekly 2; first infusion today. We'll check H&H in 2 weeks; and also in 4 weeks/Dr. Visit/labs/again IV iron.   # A. fib on Coumadin- given iron deficiency anemia/ possibility of a GI bleed [prior history of gastric ulcer]. I spoke to [place]- feels Coumadin could be held while we are evaluating the patient for the cause of his  iron deficiency. Patient is currently in sinus rhythm.  # Question GI blood loss- awaiting colonoscopy endoscopy in May as per GI.   # Low B12- as per the labs from PCP's office. We will check methylmalonic acid with next blood draw/next 2 weeks. If confirmed deficiency with check for antiparietal and anti-intrinsic antibodies.  Thank you Dr.MIller  for allowing me to participate in the care of your pleasant patient. Please do not hesitate to contact me with questions or concerns in the interim.  # 45 minutes face-to-face with the patient discussing the above plan of care; more than 50% of time spent on prognosis/ natural history; counseling and coordination.      Cammie Sickle, MD 08/27/2015 11:25 AM

## 2015-08-28 ENCOUNTER — Telehealth: Payer: Self-pay | Admitting: Cardiovascular Disease

## 2015-08-28 ENCOUNTER — Encounter
Admission: RE | Admit: 2015-08-28 | Discharge: 2015-08-28 | Disposition: A | Discharge status: Home / Self Care | Payer: PPO | Source: Ambulatory Visit | Attending: Vascular Surgery | Admitting: Vascular Surgery

## 2015-08-28 DIAGNOSIS — I6523 Occlusion and stenosis of bilateral carotid arteries: Secondary | ICD-10-CM | POA: Diagnosis not present

## 2015-08-28 DIAGNOSIS — I10 Essential (primary) hypertension: Secondary | ICD-10-CM | POA: Diagnosis not present

## 2015-08-28 DIAGNOSIS — I252 Old myocardial infarction: Secondary | ICD-10-CM | POA: Diagnosis not present

## 2015-08-28 DIAGNOSIS — N289 Disorder of kidney and ureter, unspecified: Secondary | ICD-10-CM | POA: Diagnosis not present

## 2015-08-28 DIAGNOSIS — G4730 Sleep apnea, unspecified: Secondary | ICD-10-CM | POA: Diagnosis not present

## 2015-08-28 DIAGNOSIS — I739 Peripheral vascular disease, unspecified: Secondary | ICD-10-CM | POA: Diagnosis not present

## 2015-08-28 DIAGNOSIS — I1310 Hypertensive heart and chronic kidney disease without heart failure, with stage 1 through stage 4 chronic kidney disease, or unspecified chronic kidney disease: Secondary | ICD-10-CM | POA: Diagnosis not present

## 2015-08-28 DIAGNOSIS — K219 Gastro-esophageal reflux disease without esophagitis: Secondary | ICD-10-CM | POA: Diagnosis not present

## 2015-08-28 DIAGNOSIS — E785 Hyperlipidemia, unspecified: Secondary | ICD-10-CM | POA: Diagnosis not present

## 2015-08-28 DIAGNOSIS — D649 Anemia, unspecified: Secondary | ICD-10-CM | POA: Diagnosis not present

## 2015-08-28 DIAGNOSIS — G459 Transient cerebral ischemic attack, unspecified: Secondary | ICD-10-CM | POA: Diagnosis not present

## 2015-08-28 DIAGNOSIS — Z951 Presence of aortocoronary bypass graft: Secondary | ICD-10-CM | POA: Diagnosis not present

## 2015-08-28 DIAGNOSIS — I2510 Atherosclerotic heart disease of native coronary artery without angina pectoris: Secondary | ICD-10-CM | POA: Diagnosis not present

## 2015-08-28 DIAGNOSIS — Z955 Presence of coronary angioplasty implant and graft: Secondary | ICD-10-CM | POA: Diagnosis not present

## 2015-08-28 DIAGNOSIS — I6522 Occlusion and stenosis of left carotid artery: Secondary | ICD-10-CM | POA: Diagnosis not present

## 2015-08-28 DIAGNOSIS — E669 Obesity, unspecified: Secondary | ICD-10-CM | POA: Diagnosis not present

## 2015-08-28 DIAGNOSIS — E119 Type 2 diabetes mellitus without complications: Secondary | ICD-10-CM | POA: Diagnosis not present

## 2015-08-28 DIAGNOSIS — E1122 Type 2 diabetes mellitus with diabetic chronic kidney disease: Secondary | ICD-10-CM | POA: Diagnosis not present

## 2015-08-28 DIAGNOSIS — N189 Chronic kidney disease, unspecified: Secondary | ICD-10-CM | POA: Diagnosis not present

## 2015-08-28 DIAGNOSIS — D509 Iron deficiency anemia, unspecified: Secondary | ICD-10-CM | POA: Diagnosis not present

## 2015-08-28 DIAGNOSIS — Z01810 Encounter for preprocedural cardiovascular examination: Secondary | ICD-10-CM | POA: Diagnosis not present

## 2015-08-28 DIAGNOSIS — E11649 Type 2 diabetes mellitus with hypoglycemia without coma: Secondary | ICD-10-CM | POA: Diagnosis not present

## 2015-08-28 LAB — TYPE AND SCREEN
ABO/RH(D): O POS
Antibody Screen: NEGATIVE
Extend sample reason: TRANSFUSED

## 2015-08-28 NOTE — Telephone Encounter (Signed)
Pt came in to cancel his coumadin appt, due to carotid surgery on 4/7. States DR. [name] told him to stop his coumadin until he has an EGD in May. Please call.

## 2015-08-29 ENCOUNTER — Ambulatory Visit
Admission: RE | Admit: 2015-08-29 | Discharge: 2015-08-29 | Disposition: A | Discharge status: Home / Self Care | Payer: PPO | Source: Ambulatory Visit | Attending: Internal Medicine | Admitting: Internal Medicine

## 2015-08-29 DIAGNOSIS — N281 Cyst of kidney, acquired: Secondary | ICD-10-CM

## 2015-08-29 DIAGNOSIS — N289 Disorder of kidney and ureter, unspecified: Secondary | ICD-10-CM | POA: Diagnosis not present

## 2015-08-29 NOTE — Progress Notes (Signed)
Agree with Ms. Esterwood's assessment and plan. Niko Penson E. Yuliza Cara, MD, FACG   

## 2015-08-29 NOTE — Telephone Encounter (Signed)
Returned call to pt, advised awaiting Dr Tyrell Antonio response from staff message sent regarding pt holding Coumadin until after EGD 10/15/15.  No documentation in chart I could find stating this was his recommendation.  Advised pt I would call him back after I discussed with Dr Fletcher Anon.  Pt verbalized understanding.

## 2015-08-30 ENCOUNTER — Inpatient Hospital Stay
Admission: RE | Admit: 2015-08-30 | Discharge: 2015-08-31 | DRG: 039 | Disposition: A | Discharge status: Home / Self Care | Payer: PPO | Source: Ambulatory Visit | Attending: Vascular Surgery | Admitting: Vascular Surgery

## 2015-08-30 ENCOUNTER — Inpatient Hospital Stay: Payer: PPO | Admitting: Anesthesiology

## 2015-08-30 ENCOUNTER — Encounter
Admission: RE | Disposition: A | Discharge status: Home / Self Care | Payer: Self-pay | Source: Ambulatory Visit | Attending: Vascular Surgery

## 2015-08-30 ENCOUNTER — Encounter: Payer: Self-pay | Admitting: *Deleted

## 2015-08-30 DIAGNOSIS — N189 Chronic kidney disease, unspecified: Secondary | ICD-10-CM | POA: Diagnosis not present

## 2015-08-30 DIAGNOSIS — I6522 Occlusion and stenosis of left carotid artery: Principal | ICD-10-CM | POA: Diagnosis present

## 2015-08-30 DIAGNOSIS — I1310 Hypertensive heart and chronic kidney disease without heart failure, with stage 1 through stage 4 chronic kidney disease, or unspecified chronic kidney disease: Secondary | ICD-10-CM | POA: Diagnosis present

## 2015-08-30 DIAGNOSIS — D649 Anemia, unspecified: Secondary | ICD-10-CM | POA: Diagnosis not present

## 2015-08-30 DIAGNOSIS — E11649 Type 2 diabetes mellitus with hypoglycemia without coma: Secondary | ICD-10-CM | POA: Diagnosis not present

## 2015-08-30 DIAGNOSIS — I2510 Atherosclerotic heart disease of native coronary artery without angina pectoris: Secondary | ICD-10-CM | POA: Diagnosis present

## 2015-08-30 DIAGNOSIS — K219 Gastro-esophageal reflux disease without esophagitis: Secondary | ICD-10-CM | POA: Diagnosis present

## 2015-08-30 DIAGNOSIS — I252 Old myocardial infarction: Secondary | ICD-10-CM

## 2015-08-30 DIAGNOSIS — G459 Transient cerebral ischemic attack, unspecified: Secondary | ICD-10-CM | POA: Diagnosis not present

## 2015-08-30 DIAGNOSIS — G4730 Sleep apnea, unspecified: Secondary | ICD-10-CM | POA: Diagnosis not present

## 2015-08-30 DIAGNOSIS — I739 Peripheral vascular disease, unspecified: Secondary | ICD-10-CM | POA: Diagnosis not present

## 2015-08-30 DIAGNOSIS — I10 Essential (primary) hypertension: Secondary | ICD-10-CM | POA: Diagnosis not present

## 2015-08-30 DIAGNOSIS — D509 Iron deficiency anemia, unspecified: Secondary | ICD-10-CM | POA: Diagnosis not present

## 2015-08-30 DIAGNOSIS — Z951 Presence of aortocoronary bypass graft: Secondary | ICD-10-CM | POA: Diagnosis not present

## 2015-08-30 DIAGNOSIS — I6523 Occlusion and stenosis of bilateral carotid arteries: Secondary | ICD-10-CM | POA: Diagnosis not present

## 2015-08-30 DIAGNOSIS — E785 Hyperlipidemia, unspecified: Secondary | ICD-10-CM | POA: Diagnosis not present

## 2015-08-30 DIAGNOSIS — E119 Type 2 diabetes mellitus without complications: Secondary | ICD-10-CM | POA: Diagnosis not present

## 2015-08-30 DIAGNOSIS — Z955 Presence of coronary angioplasty implant and graft: Secondary | ICD-10-CM

## 2015-08-30 DIAGNOSIS — E1122 Type 2 diabetes mellitus with diabetic chronic kidney disease: Secondary | ICD-10-CM | POA: Diagnosis present

## 2015-08-30 DIAGNOSIS — I63239 Cerebral infarction due to unspecified occlusion or stenosis of unspecified carotid arteries: Secondary | ICD-10-CM | POA: Diagnosis present

## 2015-08-30 DIAGNOSIS — N289 Disorder of kidney and ureter, unspecified: Secondary | ICD-10-CM | POA: Diagnosis not present

## 2015-08-30 DIAGNOSIS — Z01810 Encounter for preprocedural cardiovascular examination: Secondary | ICD-10-CM | POA: Diagnosis not present

## 2015-08-30 DIAGNOSIS — E669 Obesity, unspecified: Secondary | ICD-10-CM | POA: Diagnosis not present

## 2015-08-30 HISTORY — PX: ENDARTERECTOMY: SHX5162

## 2015-08-30 LAB — GLUCOSE, CAPILLARY
Glucose-Capillary: 166 mg/dL — ABNORMAL HIGH (ref 65–99)
Glucose-Capillary: 194 mg/dL — ABNORMAL HIGH (ref 65–99)
Glucose-Capillary: 246 mg/dL — ABNORMAL HIGH (ref 65–99)
Glucose-Capillary: 234 mg/dL — ABNORMAL HIGH (ref 65–99)
Glucose-Capillary: 242 mg/dL — ABNORMAL HIGH (ref 65–99)

## 2015-08-30 LAB — PROTIME-INR
INR: 1.3
Prothrombin Time: 16.3 seconds — ABNORMAL HIGH (ref 11.4–15.0)

## 2015-08-30 LAB — MRSA PCR SCREENING: MRSA by PCR: NEGATIVE

## 2015-08-30 SURGERY — ENDARTERECTOMY, CAROTID
Anesthesia: General | Laterality: Left | Wound class: Clean

## 2015-08-30 MED ORDER — POLYETHYLENE GLYCOL 3350 17 G PO PACK: 17.0000 g | PACK | Freq: Every day | ORAL | Status: DC | PRN

## 2015-08-30 MED ORDER — PROPOFOL 10 MG/ML IV BOLUS
INTRAVENOUS | Status: DC | PRN
  Administered 2015-08-30: 200 mg via INTRAVENOUS

## 2015-08-30 MED ORDER — AMIODARONE HCL 200 MG PO TABS
100.0000 mg | ORAL_TABLET | Freq: Every day | ORAL | Status: DC
  Administered 2015-08-30 – 2015-08-31 (×2): 100 mg via ORAL
  Filled 2015-08-30 (×2): qty 1

## 2015-08-30 MED ORDER — DEXAMETHASONE SODIUM PHOSPHATE 10 MG/ML IJ SOLN
INTRAMUSCULAR | Status: DC | PRN
  Administered 2015-08-30: 20 mg via INTRAVENOUS

## 2015-08-30 MED ORDER — LIDOCAINE HCL (CARDIAC) 20 MG/ML IV SOLN
INTRAVENOUS | Status: DC | PRN
  Administered 2015-08-30: 40 mg via INTRAVENOUS

## 2015-08-30 MED ORDER — DEXTROSE 5 % IV SOLN: 1.5000 g | Freq: Two times a day (BID) | INTRAVENOUS | Status: DC

## 2015-08-30 MED ORDER — ROCURONIUM BROMIDE 100 MG/10ML IV SOLN
INTRAVENOUS | Status: DC | PRN
  Administered 2015-08-30 (×2): 10 mg via INTRAVENOUS
  Administered 2015-08-30: 30 mg via INTRAVENOUS
  Administered 2015-08-30: 10 mg via INTRAVENOUS

## 2015-08-30 MED ORDER — PANTOPRAZOLE SODIUM 40 MG PO TBEC
40.0000 mg | DELAYED_RELEASE_TABLET | Freq: Two times a day (BID) | ORAL | Status: DC
  Administered 2015-08-30 – 2015-08-31 (×2): 40 mg via ORAL
  Filled 2015-08-30 (×2): qty 1

## 2015-08-30 MED ORDER — HEPARIN SODIUM (PORCINE) 1000 UNIT/ML IJ SOLN
INTRAMUSCULAR | Status: DC | PRN
  Administered 2015-08-30: 7000 [IU] via INTRAVENOUS

## 2015-08-30 MED ORDER — HYDRALAZINE HCL 20 MG/ML IJ SOLN: 5.0000 mg | INTRAMUSCULAR | Status: DC | PRN

## 2015-08-30 MED ORDER — MAGNESIUM SULFATE 2 GM/50ML IV SOLN
2.0000 g | Freq: Every day | INTRAVENOUS | Status: DC | PRN
  Filled 2015-08-30: qty 50

## 2015-08-30 MED ORDER — SORBITOL 70 % SOLN
30.0000 mL | Freq: Every day | Status: DC | PRN
  Filled 2015-08-30: qty 30

## 2015-08-30 MED ORDER — ATORVASTATIN CALCIUM 20 MG PO TABS
20.0000 mg | ORAL_TABLET | Freq: Every day | ORAL | Status: DC
  Administered 2015-08-30: 20 mg via ORAL
  Filled 2015-08-30: qty 1

## 2015-08-30 MED ORDER — ACETAMINOPHEN 325 MG RE SUPP: 325.0000 mg | RECTAL | Status: DC | PRN

## 2015-08-30 MED ORDER — GUAIFENESIN-DM 100-10 MG/5ML PO SYRP: 15.0000 mL | ORAL_SOLUTION | ORAL | Status: DC | PRN

## 2015-08-30 MED ORDER — ASPIRIN EC 81 MG PO TBEC
81.0000 mg | DELAYED_RELEASE_TABLET | Freq: Every day | ORAL | Status: DC
  Administered 2015-08-30 – 2015-08-31 (×2): 81 mg via ORAL
  Filled 2015-08-30 (×2): qty 1

## 2015-08-30 MED ORDER — LIDOCAINE HCL (PF) 1 % IJ SOLN
INTRAMUSCULAR | Status: AC
  Filled 2015-08-30: qty 30

## 2015-08-30 MED ORDER — ONDANSETRON HCL 4 MG/2ML IJ SOLN: 4.0000 mg | Freq: Four times a day (QID) | INTRAMUSCULAR | Status: DC | PRN

## 2015-08-30 MED ORDER — SODIUM CHLORIDE 0.9 % IV SOLN
INTRAVENOUS | Status: DC | PRN
  Administered 2015-08-30: 100 mL via INTRAMUSCULAR

## 2015-08-30 MED ORDER — METOPROLOL TARTRATE 1 MG/ML IV SOLN: 2.0000 mg | INTRAVENOUS | Status: DC | PRN

## 2015-08-30 MED ORDER — ESMOLOL HCL 100 MG/10ML IV SOLN
INTRAVENOUS | Status: DC | PRN
  Administered 2015-08-30: 30 mg via INTRAVENOUS

## 2015-08-30 MED ORDER — LABETALOL HCL 5 MG/ML IV SOLN: 10.0000 mg | INTRAVENOUS | Status: DC | PRN

## 2015-08-30 MED ORDER — DOPAMINE-DEXTROSE 3.2-5 MG/ML-% IV SOLN: 3.0000 ug/kg/min | INTRAVENOUS | Status: DC

## 2015-08-30 MED ORDER — NITROGLYCERIN 0.4 MG SL SUBL: 0.4000 mg | SUBLINGUAL_TABLET | SUBLINGUAL | Status: DC | PRN

## 2015-08-30 MED ORDER — RAMIPRIL 10 MG PO CAPS
10.0000 mg | ORAL_CAPSULE | Freq: Every day | ORAL | Status: DC
  Administered 2015-08-31: 10 mg via ORAL
  Filled 2015-08-30: qty 1

## 2015-08-30 MED ORDER — NITROGLYCERIN IN D5W 200-5 MCG/ML-% IV SOLN: 5.0000 ug/min | INTRAVENOUS | Status: DC

## 2015-08-30 MED ORDER — METFORMIN HCL 500 MG PO TABS
500.0000 mg | ORAL_TABLET | Freq: Two times a day (BID) | ORAL | Status: DC
  Administered 2015-08-31: 500 mg via ORAL
  Filled 2015-08-30: qty 1

## 2015-08-30 MED ORDER — MIDAZOLAM HCL 2 MG/2ML IJ SOLN
INTRAMUSCULAR | Status: DC | PRN
  Administered 2015-08-30: 2 mg via INTRAVENOUS

## 2015-08-30 MED ORDER — FENTANYL CITRATE (PF) 100 MCG/2ML IJ SOLN
INTRAMUSCULAR | Status: AC
  Filled 2015-08-30: qty 2

## 2015-08-30 MED ORDER — INSULIN ASPART 100 UNIT/ML ~~LOC~~ SOLN
0.0000 [IU] | Freq: Three times a day (TID) | SUBCUTANEOUS | Status: DC
  Administered 2015-08-30 (×2): 5 [IU] via SUBCUTANEOUS
  Administered 2015-08-30: 6 [IU] via SUBCUTANEOUS
  Administered 2015-08-31: 8 [IU] via SUBCUTANEOUS
  Administered 2015-08-31: 5 [IU] via SUBCUTANEOUS
  Filled 2015-08-30 (×4): qty 5
  Filled 2015-08-30: qty 8

## 2015-08-30 MED ORDER — POTASSIUM CHLORIDE CRYS ER 20 MEQ PO TBCR
20.0000 meq | EXTENDED_RELEASE_TABLET | Freq: Every day | ORAL | Status: AC | PRN
  Administered 2015-08-31: 20 meq via ORAL
  Filled 2015-08-30: qty 1

## 2015-08-30 MED ORDER — DEXTROSE 5 % IV SOLN
2.0000 g | INTRAVENOUS | Status: AC
  Administered 2015-08-30: 2 g via INTRAVENOUS
  Filled 2015-08-30: qty 20

## 2015-08-30 MED ORDER — GLIMEPIRIDE 2 MG PO TABS
4.0000 mg | ORAL_TABLET | Freq: Two times a day (BID) | ORAL | Status: DC
  Administered 2015-08-31: 4 mg via ORAL
  Filled 2015-08-30: qty 1
  Filled 2015-08-30: qty 2

## 2015-08-30 MED ORDER — DOCUSATE SODIUM 100 MG PO CAPS
100.0000 mg | ORAL_CAPSULE | Freq: Every day | ORAL | Status: DC
  Administered 2015-08-31: 100 mg via ORAL
  Filled 2015-08-30: qty 1

## 2015-08-30 MED ORDER — SUCCINYLCHOLINE CHLORIDE 20 MG/ML IJ SOLN
INTRAMUSCULAR | Status: DC | PRN
  Administered 2015-08-30: 60 mg via INTRAVENOUS
  Administered 2015-08-30: 120 mg via INTRAVENOUS

## 2015-08-30 MED ORDER — PHENOL 1.4 % MT LIQD
1.0000 | OROMUCOSAL | Status: DC | PRN
  Filled 2015-08-30: qty 177

## 2015-08-30 MED ORDER — OXYCODONE-ACETAMINOPHEN 5-325 MG PO TABS: 1.0000 | ORAL_TABLET | ORAL | Status: DC | PRN

## 2015-08-30 MED ORDER — AMLODIPINE BESYLATE 5 MG PO TABS
5.0000 mg | ORAL_TABLET | Freq: Every day | ORAL | Status: DC
  Administered 2015-08-31: 5 mg via ORAL
  Filled 2015-08-30: qty 1

## 2015-08-30 MED ORDER — ACETAMINOPHEN 325 MG PO TABS
325.0000 mg | ORAL_TABLET | ORAL | Status: DC | PRN
  Administered 2015-08-31: 650 mg via ORAL
  Filled 2015-08-30: qty 2

## 2015-08-30 MED ORDER — PHENYLEPHRINE HCL 10 MG/ML IJ SOLN
INTRAMUSCULAR | Status: DC | PRN
Start: 2015-08-30 — End: 2015-08-30
  Administered 2015-08-30 (×8): 100 ug via INTRAVENOUS

## 2015-08-30 MED ORDER — INSULIN GLARGINE 100 UNIT/ML ~~LOC~~ SOLN
20.0000 [IU] | Freq: Every day | SUBCUTANEOUS | Status: DC
  Administered 2015-08-31: 20 [IU] via SUBCUTANEOUS
  Filled 2015-08-30: qty 0.2

## 2015-08-30 MED ORDER — FAMOTIDINE IN NACL 20-0.9 MG/50ML-% IV SOLN
20.0000 mg | Freq: Two times a day (BID) | INTRAVENOUS | Status: DC
  Administered 2015-08-30 – 2015-08-31 (×2): 20 mg via INTRAVENOUS
  Filled 2015-08-30 (×3): qty 50

## 2015-08-30 MED ORDER — MORPHINE SULFATE (PF) 2 MG/ML IV SOLN
2.0000 mg | INTRAVENOUS | Status: DC | PRN
  Administered 2015-08-30: 2 mg via INTRAVENOUS
  Administered 2015-08-30: 4 mg via INTRAVENOUS
  Administered 2015-08-30: 2 mg via INTRAVENOUS
  Filled 2015-08-30: qty 2
  Filled 2015-08-30 (×2): qty 1

## 2015-08-30 MED ORDER — CETYLPYRIDINIUM CHLORIDE 0.05 % MT LIQD
7.0000 mL | Freq: Two times a day (BID) | OROMUCOSAL | Status: DC
  Administered 2015-08-31: 7 mL via OROMUCOSAL

## 2015-08-30 MED ORDER — LACTATED RINGERS IV SOLN: INTRAVENOUS | Status: DC | PRN

## 2015-08-30 MED ORDER — CEFAZOLIN SODIUM-DEXTROSE 2-4 GM/100ML-% IV SOLN
INTRAVENOUS | Status: AC
  Filled 2015-08-30: qty 100

## 2015-08-30 MED ORDER — FE FUMARATE-B12-VIT C-FA-IFC PO CAPS
1.0000 | ORAL_CAPSULE | Freq: Every day | ORAL | Status: DC
  Administered 2015-08-31: 1 via ORAL
  Filled 2015-08-30: qty 1

## 2015-08-30 MED ORDER — NEOSTIGMINE METHYLSULFATE 10 MG/10ML IV SOLN
INTRAVENOUS | Status: DC | PRN
  Administered 2015-08-30: 4.5 mg via INTRAVENOUS

## 2015-08-30 MED ORDER — GLYCOPYRROLATE 0.2 MG/ML IJ SOLN
INTRAMUSCULAR | Status: DC | PRN
  Administered 2015-08-30: .6 mg via INTRAVENOUS

## 2015-08-30 MED ORDER — DEXTROSE 5 % IV SOLN
1.5000 g | Freq: Two times a day (BID) | INTRAVENOUS | Status: AC
  Administered 2015-08-30 – 2015-08-31 (×2): 1.5 g via INTRAVENOUS
  Filled 2015-08-30 (×2): qty 1.5

## 2015-08-30 MED ORDER — ONDANSETRON HCL 4 MG/2ML IJ SOLN: 4.0000 mg | Freq: Once | INTRAMUSCULAR | Status: DC | PRN

## 2015-08-30 MED ORDER — ALUM & MAG HYDROXIDE-SIMETH 200-200-20 MG/5ML PO SUSP: 15.0000 mL | ORAL | Status: DC | PRN

## 2015-08-30 MED ORDER — SODIUM CHLORIDE 0.9 % IV SOLN: 500.0000 mL | Freq: Once | INTRAVENOUS | Status: DC | PRN

## 2015-08-30 MED ORDER — SODIUM CHLORIDE 0.9 % IV SOLN
INTRAVENOUS | Status: DC
  Administered 2015-08-30: 50 mL via INTRAVENOUS
  Administered 2015-08-30: 50 mL/h via INTRAVENOUS
  Administered 2015-08-30: 08:00:00 via INTRAVENOUS

## 2015-08-30 MED ORDER — FENTANYL CITRATE (PF) 100 MCG/2ML IJ SOLN
INTRAMUSCULAR | Status: DC | PRN
  Administered 2015-08-30: 100 ug via INTRAVENOUS
  Administered 2015-08-30: 25 ug via INTRAVENOUS
  Administered 2015-08-30 (×2): 50 ug via INTRAVENOUS

## 2015-08-30 MED ORDER — CEFUROXIME SODIUM 1.5 G IJ SOLR
1.5000 g | Freq: Two times a day (BID) | INTRAMUSCULAR | Status: DC
Start: 2015-08-30 — End: 2015-08-30
  Filled 2015-08-30 (×2): qty 1.5

## 2015-08-30 MED ORDER — HEPARIN SODIUM (PORCINE) 5000 UNIT/ML IJ SOLN
INTRAMUSCULAR | Status: AC
  Filled 2015-08-30: qty 1

## 2015-08-30 MED ORDER — FENTANYL CITRATE (PF) 100 MCG/2ML IJ SOLN
25.0000 ug | INTRAMUSCULAR | Status: DC | PRN
  Administered 2015-08-30 (×2): 25 ug via INTRAVENOUS

## 2015-08-30 MED ORDER — EPHEDRINE SULFATE 50 MG/ML IJ SOLN
INTRAMUSCULAR | Status: DC | PRN
  Administered 2015-08-30 (×2): 10 mg via INTRAVENOUS

## 2015-08-30 MED ORDER — ASPIRIN EC 81 MG PO TBEC: 81.0000 mg | DELAYED_RELEASE_TABLET | Freq: Every day | ORAL | Status: DC

## 2015-08-30 MED ORDER — SODIUM CHLORIDE 0.9 % IV SOLN
10000.0000 ug | INTRAVENOUS | Status: DC | PRN
  Administered 2015-08-30: 50 ug/min via INTRAVENOUS

## 2015-08-30 SURGICAL SUPPLY — 67 items
APPLIER CLIP 11 MED OPEN (CLIP)
APPLIER CLIP 9.375 SM OPEN (CLIP)
APR CLP MED 11 20 MLT OPN (CLIP)
APR CLP SM 9.3 20 MLT OPN (CLIP)
BAG DECANTER FOR FLEXI CONT (MISCELLANEOUS) ×2 IMPLANT
BLADE SURG 15 STRL LF DISP TIS (BLADE) ×1 IMPLANT
BLADE SURG 15 STRL SS (BLADE) ×2
BLADE SURG SZ11 CARB STEEL (BLADE) ×2 IMPLANT
BOOT SUTURE AID YELLOW STND (SUTURE) ×4 IMPLANT
BRUSH SCRUB 4% CHG (MISCELLANEOUS) ×2 IMPLANT
CANISTER SUCT 1200ML W/VALVE (MISCELLANEOUS) ×2 IMPLANT
CATH TRAY 16F METER LATEX (MISCELLANEOUS) ×2 IMPLANT
CLIP APPLIE 11 MED OPEN (CLIP) IMPLANT
CLIP APPLIE 9.375 SM OPEN (CLIP) IMPLANT
DRAPE INCISE IOBAN 66X45 STRL (DRAPES) ×2 IMPLANT
DRAPE LAPAROTOMY 100X77 ABD (DRAPES) ×2 IMPLANT
DRAPE SHEET LG 3/4 BI-LAMINATE (DRAPES) ×1 IMPLANT
DRESSING SURGICEL FIBRLLR 1X2 (HEMOSTASIS) ×1 IMPLANT
DRSG SURGICEL FIBRILLAR 1X2 (HEMOSTASIS) ×2
DURAPREP 26ML APPLICATOR (WOUND CARE) ×2 IMPLANT
ELECT CAUTERY BLADE 6.4 (BLADE) ×2 IMPLANT
ELECT REM PT RETURN 9FT ADLT (ELECTROSURGICAL) ×2
ELECTRODE REM PT RTRN 9FT ADLT (ELECTROSURGICAL) ×1 IMPLANT
EVICEL 5ML SEALNT HUMAN B (Miscellaneous) ×1 IMPLANT
GLOVE BIO SURGEON STRL SZ7 (GLOVE) ×5 IMPLANT
GLOVE INDICATOR 7.5 STRL GRN (GLOVE) ×4 IMPLANT
GLOVE SURG SYN 8.0 (GLOVE) ×2 IMPLANT
GLOVE SURG SYN 8.0 PF PI (GLOVE) ×1 IMPLANT
GOWN STRL REUS W/ TWL LRG LVL3 (GOWN DISPOSABLE) ×2 IMPLANT
GOWN STRL REUS W/ TWL XL LVL3 (GOWN DISPOSABLE) ×1 IMPLANT
GOWN STRL REUS W/TWL LRG LVL3 (GOWN DISPOSABLE) ×6
GOWN STRL REUS W/TWL XL LVL3 (GOWN DISPOSABLE) ×2
HOLDER FOLEY CATH W/STRAP (MISCELLANEOUS) ×2 IMPLANT
IV NS 500ML (IV SOLUTION) ×2
IV NS 500ML BAXH (IV SOLUTION) ×1 IMPLANT
KIT RM TURNOVER STRD PROC AR (KITS) ×2 IMPLANT
LABEL OR SOLS (LABEL) ×2 IMPLANT
LIQUID BAND (GAUZE/BANDAGES/DRESSINGS) ×2 IMPLANT
LOOP RED MAXI  1X406MM (MISCELLANEOUS) ×2
LOOP VESSEL MAXI 1X406 RED (MISCELLANEOUS) ×2 IMPLANT
LOOP VESSEL MINI 0.8X406 BLUE (MISCELLANEOUS) ×1 IMPLANT
LOOPS BLUE MINI 0.8X406MM (MISCELLANEOUS) ×1
NDL FILTER BLUNT 18X1 1/2 (NEEDLE) ×1 IMPLANT
NDL HYPO 25X1 1.5 SAFETY (NEEDLE) ×1 IMPLANT
NEEDLE FILTER BLUNT 18X 1/2SAF (NEEDLE) ×1
NEEDLE FILTER BLUNT 18X1 1/2 (NEEDLE) ×1 IMPLANT
NEEDLE HYPO 25X1 1.5 SAFETY (NEEDLE) IMPLANT
NS IRRIG 500ML POUR BTL (IV SOLUTION) ×2 IMPLANT
PACK BASIN MAJOR ARMC (MISCELLANEOUS) ×2 IMPLANT
PENCIL ELECTRO HAND CTR (MISCELLANEOUS) IMPLANT
SHUNT CAROTID STR REINF 3.0X4. (MISCELLANEOUS) ×2 IMPLANT
SUT MNCRL+ 5-0 UNDYED PC-3 (SUTURE) ×1 IMPLANT
SUT MONOCRYL 5-0 (SUTURE) ×1
SUT PROLENE 6 0 BV (SUTURE) ×16 IMPLANT
SUT PROLENE 7 0 BV 1 (SUTURE) ×12 IMPLANT
SUT SILK 2 0 (SUTURE) ×2
SUT SILK 2-0 18XBRD TIE 12 (SUTURE) ×1 IMPLANT
SUT SILK 3 0 (SUTURE) ×4
SUT SILK 3-0 18XBRD TIE 12 (SUTURE) ×1 IMPLANT
SUT SILK 4 0 (SUTURE) ×4
SUT SILK 4-0 18XBRD TIE 12 (SUTURE) ×1 IMPLANT
SUT VIC AB 3-0 SH 27 (SUTURE) ×4
SUT VIC AB 3-0 SH 27X BRD (SUTURE) ×1 IMPLANT
SYR 20CC LL (SYRINGE) ×2 IMPLANT
SYR 3ML LL SCALE MARK (SYRINGE) ×1 IMPLANT
SYRINGE 10CC LL (SYRINGE) ×2 IMPLANT
TUBING CONNECTING 10 (TUBING) IMPLANT

## 2015-08-30 NOTE — Op Note (Signed)
[facility]   OPERATIVE NOTE  PROCEDURE:   1.  Left carotid endarterectomy with Primary Closure  PRE-OPERATIVE DIAGNOSIS: 1.  Critical stenosis of the left Carotid Artery 2. Coronary artery disease status post CABG 3.  Diabetes  POST-OPERATIVE DIAGNOSIS: same as above   SURGEON: Katha Cabal, MD  ASSISTANT(S): Ms. Hezzie Bump  ANESTHESIA: general  ESTIMATED BLOOD LOSS: 100 cc  FINDING(S): 1.  Extensive calcified carotid plaque.  SPECIMEN(S):  Carotid plaque (sent to Pathology)  INDICATIONS:   Seth Hardy is a 70 y.o. y.o. male who presents with critical left carotid stenosis of 90 %.  The risks, benefits, and alternatives to carotid endarterectomy were discussed with the patient. The differences between carotid stenting and carotid endarterectomy were reviewed.  The patient voiced understanding and appears to be aware that the risks of carotid endarterectomy include but are not limited to: bleeding, infection, stroke, myocardial infarction, death, cranial nerve injuries both temporary and permanent, neck hematoma, possible airway compromise, labile blood pressure post-operatively, cerebral hyperperfusion syndrome, and possible need for additional interventions in the future. The patient is aware of the risks and agrees to proceed forward with the procedure.  DESCRIPTION: After full informed written consent was obtained from the patient, the patient was brought back to the operating room and placed supine upon the operating table.  Prior to induction, the patient received IV antibiotics.  After obtaining adequate anesthesia, the patient was placed a supine position with a shoulder roll in place and the patient's neck slightly hyperextended and rotated away from the surgical site.  The patient was prepped in the standard fashion for a carotid endarterectomy.    The skin incision was made in the left neck anterior to the sternocleidomastoid muscle and  dissected down through the subcutaneous tissue.  The platysmas was opened with electrocautery.  The internal jugular vein and facial vein were identified.  The facial vein is ligated and divided between 2-0 silk ties.  The omohyoid was identified in the common carotid artery exposed at this level. The dissection was there in carried out along the carotid artery in a cranial direction.  The dissection was then carried along periadventitial plane along the common carotid artery up to the bifurcation. The external carotid artery was identified. Vessel loops were then placed around the external carotid artery as well as the superior thyroid artery. In the process of this dissection, the hypoglossal nerve was identified and protected from harm.  The internal carotid artery was then dissected circumferentially just beyond an area in the internal carotid artery distal to the plaque.    At this point, we gave the patient 7000 units of intravenous heparin and this was allowed to circulate for several minutes.  The common carotid artery followed by the external carotid and then the internal carotid artery were clamped.  Arteriotomy was made in the common carotid artery with a 11 blade, and extended the arteriotomy with a Potts scissor down into the common carotid artery, then the arteriotomy was carried through the bifurcation into the internal carotid artery until I reached an area that was not diseased.  At this point, a Sundt shunt was placed without difficulty.  The endarterectomy was begun in the common carotid artery with a Garment/textile technologist and carried this dissection down into the common carotid artery circumferentially.  Then I transected the plaque at a segment where it was adherent and transected the plaque with Potts scissors.  I then carried this dissection up  into the external carotid artery.  The plaque was extracted by unclamping the external carotid artery and performing an eversion endarterectomy.  The  dissection was then carried into the internal carotid artery where a  feathered end point was created.  The plaque was passed off the field as a specimen.  The distal endpoint was tacked down with 6 interrupted 7-0 Prolene sutures.  The arteriotomy was then closed using running 6-0 Prolene beginning at the distal margin and running the second 6-0 Prolene from the common carotid end.  Prior to completing the primary repair, the shunt was removed, the internal carotid artery was flushed and there was excellent backbleeding.  The carotid artery repair was flushed with heparinized saline and then the primary repair was completed in the usual fashion.  The flow was then reestablished first to the external carotid artery and then the internal carotid artery to prevent distal embolization.   Several minutes of pressure were held and 6-0 Prolene sutures were used as need for hemostasis.  At this point, I placed Surgicel and Evicel topical hemostatic agents.  There was no more active bleeding in the surgical site.  The sternocleidomastoid space was closed with three interrupted 3-0 Vicryl sutures. I then reapproximated the platysma muscle with a running stitch of 3-0 Vicryl.  The skin was then closed with a running subcuticular 4-0 Monocryl.  The skin was then cleaned, dried and Dermabond was used to reinforce the skin closure.  The patient awakened and was taken to the recovery room in stable condition, following commands and moving all four extremities without any apparent deficits.    COMPLICATIONS: none  CONDITION: stable  Seth Hardy, Seth Hardy 08/30/2015<2:17 PM

## 2015-08-30 NOTE — Anesthesia Procedure Notes (Addendum)
Procedure Name: Intubation Date/Time: 08/30/2015 9:25 AM Performed by: Allean Found Pre-anesthesia Checklist: Patient identified, Emergency Drugs available, Suction available, Patient being monitored and Timeout performed Patient Re-evaluated:Patient Re-evaluated prior to inductionOxygen Delivery Method: Circle system utilized Preoxygenation: Pre-oxygenation with 100% oxygen Intubation Type: IV induction Ventilation: Mask ventilation without difficulty Laryngoscope Size: Mac, 3, Miller and McGraph Grade View: Grade II Tube type: Oral Tube size: 7.0 mm Number of attempts: 3 Secured at: 24 cm Dental Injury: Teeth and Oropharynx as per pre-operative assessment  Difficulty Due To: Difficult Airway- due to anterior larynx Future Recommendations: Recommend- induction with short-acting agent, and alternative techniques readily available Comments: Started with Mac 3 could visualize arytenoids, attempted Bougie, failed. attempted McGraph 3, could visualize entire glottis, could not direct bougie, Dr. Kayleen Memos attempted Sabra Heck 3 with Bougie  Downsized to 7.0 from 7.5, Success. Able to ventilate via mask the entire time.

## 2015-08-30 NOTE — Transfer of Care (Signed)
Immediate Anesthesia Transfer of Care Note  Patient: Seth Hardy  Procedure(s) Performed: Procedure(s): ENDARTERECTOMY CAROTID (Left)  Patient Location: PACU  Anesthesia Type:General  Level of Consciousness: oriented  Airway & Oxygen Therapy: Patient Spontanous Breathing and Patient connected to face mask oxygen  Post-op Assessment: Report given to RN and Post -op Vital signs reviewed and stable  Post vital signs: Reviewed and stable  Last Vitals:  Filed Vitals:   08/30/15 0628  BP: 129/47  Pulse: 50  Temp: 37 C  Resp: 16    Complications: No apparent anesthesia complications

## 2015-08-30 NOTE — Anesthesia Preprocedure Evaluation (Signed)
Anesthesia Evaluation  Patient identified by MRN, date of birth, ID band Patient awake    Reviewed: Allergy & Precautions, NPO status , Patient's Chart, lab work & pertinent test results  Airway Mallampati: II  TM Distance: >3 FB Neck ROM: Full    Dental  (+) Caps, Chipped   Pulmonary shortness of breath and with exertion, sleep apnea and Continuous Positive Airway Pressure Ventilation ,    Pulmonary exam normal breath sounds clear to auscultation       Cardiovascular hypertension, Pt. on medications + CAD, + Past MI and + Peripheral Vascular Disease  Normal cardiovascular exam+ dysrhythmias Atrial Fibrillation + Valvular Problems/Murmurs AS      Neuro/Psych negative neurological ROS  negative psych ROS   GI/Hepatic GERD  Medicated and Controlled,  Endo/Other  diabetes, Well Controlled, Type 2, Oral Hypoglycemic Agents  Renal/GU Renal InsufficiencyRenal disease  negative genitourinary   Musculoskeletal negative musculoskeletal ROS (+)   Abdominal Normal abdominal exam  (+)   Peds negative pediatric ROS (+)  Hematology  (+) anemia ,   Anesthesia Other Findings Mild AS  Reproductive/Obstetrics                             Anesthesia Physical Anesthesia Plan  ASA: III  Anesthesia Plan: General   Post-op Pain Management:    Induction: Intravenous  Airway Management Planned: Oral ETT  Additional Equipment: Arterial line  Intra-op Plan:   Post-operative Plan: Extubation in OR  Informed Consent: I have reviewed the patients History and Physical, chart, labs and discussed the procedure including the risks, benefits and alternatives for the proposed anesthesia with the patient or authorized representative who has indicated his/her understanding and acceptance.   Dental advisory given  Plan Discussed with: CRNA and Surgeon  Anesthesia Plan Comments:         Anesthesia Quick  Evaluation

## 2015-08-30 NOTE — Progress Notes (Signed)
RN made Dr. Delana Meyer aware that patient's cuff pressure is reading 130's over 50's for the past 2 hours but art line pressure is [id number] systolically. Dr. Delana Meyer stated "follow the cuff pressure and he can have sips of clear liquids."

## 2015-08-30 NOTE — Progress Notes (Signed)
Inpatient Diabetes Program Recommendations  AACE/ADA: New Consensus Statement on Inpatient Glycemic Control (2015)  Target Ranges:  Prepandial:   less than 140 mg/dL      Peak postprandial:   less than 180 mg/dL (1-2 hours)      Critically ill patients:  140 - 180 mg/dL   Review of Glycemic Control Results for KAPTAIN, CRUISE (MRN [id number]) as of 08/30/2015 14:18  Ref. Range 08/30/2015 06:46 08/30/2015 10:58 08/30/2015 12:39  Glucose-Capillary Latest Ref Range: 65-99 mg/dL 166 (H) 194 (H) 246 (H)     Diabetes history: Type 2 Outpatient Diabetes medications: Amaryl 4mg  bid, Lantus 20 units qhs, Humalog 8units tid, Metformin 500mg  bid Current orders for Inpatient glycemic control: Amaryl 4mg  bid, Lantus 20 units qhs, Novolog 0-15 units tid, Metformin 500mg  bid  Inpatient Diabetes Program Recommendations:  Continue Novolog correction as ordered but consider adding mealtime Novolog 4 units tid with meals (hold if he eats less than 50%)  Gentry Fitz, RN, BA, [place], CDE Diabetes Coordinator Inpatient Diabetes Program  [phone] (Team Pager) [phone] ([place]) 08/30/2015 2:21 PM

## 2015-08-30 NOTE — H&P (Signed)
[facility] Admission History & Physical  MRN : [id number]  Seth Hardy is a 70 y.o. (05-08-1946) male who presents with chief complaint of blockage in my carotid artery.  History of Present Illness: Patient presents this morning for treatment of his carotid artery stenosis. Since his last visit he has been found to have a severe anemia secondary to iron deficiency. This is now been corrected. He denies amaurosis fugax, interval TIA or past CVA.  Patient is been worked up for stenosis of the left internal carotid artery this is been identified as greater than 80%. He is been evaluated by cardiology and cleared for surgery.  Current Facility-Administered Medications  Medication Dose Route Frequency Provider Last Rate Last Dose  . 0.9 %  sodium chloride infusion   Intravenous Continuous Molli Barrows, MD 50 mL/hr at 08/30/15 0725 50 mL/hr at 08/30/15 0725  . ceFAZolin (ANCEF) 2 g in dextrose 5 % 50 mL IVPB  2 g Intravenous On Call to [place], PA-C      . ceFAZolin (ANCEF) 2-4 GM/100ML-% IVPB             Past Medical History  Diagnosis Date  . Hypertensive heart disease   . Hyperlipidemia   . Diabetes mellitus type II, controlled ([place])     a. Variable CBG 02/2012 - several meds adjusted.  . Mild aortic stenosis     a. 03/2011 Echo: EF 55-60%, Mild AS. b. Mild by cath 02/2012; c. 01/2014 Echo: EF 65-70%, Gr 1 DD, mild AS, mildly dil LA.  . Obesity   . GERD (gastroesophageal reflux disease)   . PAF (paroxysmal atrial fibrillation) ([place])     a. Newly dx 02/2012 & initiated on Coumadin (spont converted to NSR).  Marland Kitchen CAD (coronary artery disease)     a. 01/2010 CABG x 4: LIMA->LAD, VG->D1, VG->OM1, VG->PDA. b. NSTEMI 02/2012 in setting of AF-RVR with cath s/p BMS to RCA (plan for 1 month, possibly up to 3 months of Plavix)  . Hypertension   . Macular edema bil    lazer work done previously  . Myocardial infarction [facility]) 2014    stents (x1) at time   . Carotid disease, bilateral ([place])     a. 03/2011 U/S: 40-59% bilat Carotid dzs;  b. 04/2015 Carotid U/S: 40-59% bilat ICA stenosis; c. 05/2015 CTA Neck: [id number] RICA, [id number] LICA, mod-marked R vertebral stenosis, mod L vertebral stenosis.  . Sleep apnea     CPAP with oxygen  . Shortness of breath dyspnea   . Chronic kidney disease     small obtusion per pcp. ultrasound done on 08/29/2015  . Basal cell carcinoma of skin 2012    removed several spots from arms and back  . Anemia 08/2015    received 2 units rbc one week ago    Past Surgical History  Procedure Laterality Date  . Cyst l kidney  [place]  . Angioplasty    . Left heart catheterization with coronary angiogram N/A 03/09/2012    Procedure: LEFT HEART CATHETERIZATION WITH CORONARY ANGIOGRAM;  Surgeon: Wellington Hampshire, MD;  Location: [place] CATH LAB;  Service: Cardiovascular;  Laterality: N/A;  . Percutaneous coronary stent intervention (pci-s)  03/09/2012    Procedure: PERCUTANEOUS CORONARY STENT INTERVENTION (PCI-S);  Surgeon: Wellington Hampshire, MD;  Location: [facility] CATH LAB;  Service: Cardiovascular;;  . Coronary artery bypass graft  2011    [facility], [place], [place],   .  Cataract extraction Bilateral 2014/09/23    Cataract Extraction with IOL  . Cardiac catheterization  09/22/2012  . Coronary angioplasty      Dr. Fletcher Anon  . Inguinal hernia repair Right     Right Inguinal Hernia Repair    Social History Social History  Substance Use Topics  . Smoking status: Never Smoker   . Smokeless tobacco: Never Used     Comment: tobacco use - no.no passive smoke in home  . Alcohol Use: Yes     Comment: rare drink    Family History Family History  Problem Relation Age of Onset  . COPD Mother     alive 40  . Heart attack Father     09/22/13 deceased  . Bladder Cancer Mother   . Lung cancer Maternal Aunt   . Liver cancer Maternal Aunt   . Kidney disease Paternal Uncle   . Cancer Other     all paternal aunts and uncles   no family history of  bleeding clotting disorders, porphyria or autoimmune disease  No Known Allergies   REVIEW OF SYSTEMS (Negative unless checked)  Constitutional: [] Weight loss  [] Fever  [] Chills Cardiac: [] Chest pain   [] Chest pressure   [] Palpitations   [] Shortness of breath when laying flat   [] Shortness of breath at rest   [] Shortness of breath with exertion. Vascular:  [] Pain in legs with walking   [] Pain in legs at rest   [] Pain in legs when laying flat   [] Claudication   [] Pain in feet when walking  [] Pain in feet at rest  [] Pain in feet when laying flat   [] History of DVT   [] Phlebitis   [] Swelling in legs   [] Varicose veins   [] Non-healing ulcers Pulmonary:   [] Uses home oxygen   [] Productive cough   [] Hemoptysis   [] Wheeze  [] COPD   [] Asthma Neurologic:  [] Dizziness  [] Blackouts   [] Seizures   [] History of stroke   [] History of TIA  [] Aphasia   [] Temporary blindness   [] Dysphagia   [] Weakness or numbness in arms   [] Weakness or numbness in legs Musculoskeletal:  [] Arthritis   [] Joint swelling   [] Joint pain   [] Low back pain Hematologic:  [] Easy bruising  [] Easy bleeding   [] Hypercoagulable state   [] Anemic  [] Hepatitis Gastrointestinal:  [] Blood in stool   [] Vomiting blood  [] Gastroesophageal reflux/heartburn   [] Difficulty swallowing. Genitourinary:  [] Chronic kidney disease   [] Difficult urination  [] Frequent urination  [] Burning with urination   [] Blood in urine Skin:  [] Rashes   [] Ulcers   [] Wounds Psychological:  [] History of anxiety   []  History of major depression.  Physical Examination  Filed Vitals:   08/30/15 0628  BP: 129/47  Pulse: 50  Temp: 98.6 F (37 C)  TempSrc: Tympanic  Resp: 16  Height: 5\' 6"  (1.676 m)  Weight: 86.183 kg (190 lb)  SpO2: 97%   Body mass index is 30.68 kg/(m^2). Gen: WD/WN, NAD Head: [place]/AT, No temporalis wasting. Prominent temp pulse not noted. Ear/Nose/Throat: Hearing grossly intact, nares w/o erythema or drainage, oropharynx w/o Erythema/Exudate,   Eyes: PERRLA, EOMI.  Neck: Supple, no nuchal rigidity.  No bruit or JVD.  Pulmonary:  Good air movement, clear to auscultation bilaterally, no use of accessory muscles.  Cardiac: RRR, normal S1, S2, no Murmurs, rubs or gallops. Vascular: Left carotid bruit noted  Vessel Right Left  Radial Palpable Palpable  Ulnar Palpable Palpable  Brachial Palpable Palpable  Carotid Palpable, without bruit Palpable, without bruit  Aorta Not palpable  N/A  Femoral Palpable Palpable  Popliteal Palpable Palpable  PT Palpable Palpable  DP Palpable Palpable   Gastrointestinal: soft, non-tender/non-distended. No guarding/reflex.  Musculoskeletal: M/S 5/5 throughout.  Extremities without ischemic changes.  No deformity or atrophy.  Neurologic: CN 2-12 intact. Pain and light touch intact in extremities.  Symmetrical.  Speech is fluent. Motor exam as listed above. Psychiatric: Judgment intact, Mood & affect appropriate for pt's clinical situation. Dermatologic: No rashes or ulcers noted.  No cellulitis or open wounds. Lymph : No Cervical, Axillary, or Inguinal lymphadenopathy.     CBC Lab Results  Component Value Date   WBC 4.5 08/22/2015   HGB 8.0 Repeated and verified X2.* 08/22/2015   HCT 26.2 Repeated and verified X2.* 08/22/2015   MCV 67.4 Repeated and verified X2.* 08/22/2015   PLT 264.0 08/22/2015    BMET    Component Value Date/Time   NA 139 07/25/2015 1138   NA 139 01/22/2013 0348   K 4.1 07/25/2015 1138   K 4.2 01/22/2013 0348   CL 103 07/25/2015 1138   CL 106 01/22/2013 0348   CO2 27 07/25/2015 1138   CO2 28 01/22/2013 0348   GLUCOSE 216* 07/25/2015 1138   GLUCOSE 158* 01/22/2013 0348   BUN 14 07/25/2015 1138   BUN 16 01/22/2013 0348   CREATININE 1.03 07/25/2015 1138   CREATININE 1.02 01/22/2013 0348   CALCIUM 9.1 07/25/2015 1138   CALCIUM 8.6 01/22/2013 0348   GFRNONAA >60 07/25/2015 1138   GFRNONAA >60 01/22/2013 0348   GFRAA >60 07/25/2015 1138   GFRAA >60 01/22/2013  0348   CrCl cannot be calculated (Patient has no serum creatinine result on file.).  COAG Lab Results  Component Value Date   INR 1.30 08/30/2015   INR 2.4 07/31/2015   INR 2.35 07/25/2015    Radiology Ct Chest W Contrast  08/20/2015  CLINICAL DATA:  Patient c/o fatigue and SOB worsening x 3-4 weeks. NKI. EXAM: CT ABDOMEN AND PELVIS WITH CONTRAST TECHNIQUE: Multidetector CT imaging of the abdomen and pelvis was performed using the standard protocol following bolus administration of intravenous contrast. CONTRAST:  11mL ISOVUE-300 IOPAMIDOL (ISOVUE-300) INJECTION 61% COMPARISON:  None. FINDINGS: There are small bilateral pleural effusions. Heart size normal with no significant pericardial effusion. Extensive coronary artery calcification. Patient is status post CABG. Thoracic aorta shows mild calcifications. Central pulmonary arteries appear normal. Prominent right paratracheal lymph node measuring 14 mm but retaining a fatty hilum. Sub carinal adenopathy measures 16 mm. No acute pulmonary parenchymal abnormality. 2 mm pulmonary nodule on the right image number 30. Mild dependent atelectasis bilaterally. Focal nodular right pleural thickening is 2 mm image number 31. Mild diffuse hepatic view is.  5 mm gallstone. Pancreas and spleen are normal.  Small splenule is present. Adrenal glands are normal. Left kidney is normal. There are 2 low-attenuation lesions on the right, 1 measuring 2 cm and consistent with an upper pole renal cyst. The other measures 1.5 cm with an average attenuation value of 15. It projects over both liver and renal margin. Based on contours and is likely an exophytic renal cyst. Bilateral perinephric inflammation is likely not of acute origin. No stones or hydronephrosis. Mild bladder wall thickening. Moderate aortoiliac calcification. No ascites. Nonobstructive bowel gas pattern. No focal bowel abnormalities. Mild diverticulosis sigmoid colon. Appendix normal. No significant  retroperitoneal adenopathy. There is a nonspecific 9 mm celiac axis lymph node. Prostate is mildly prominent at 5.6 cm with mild nodularity at its margin with the base of  the bladder. No acute musculoskeletal findings. IMPRESSION: Small nonspecific bilateral pleural effusions of uncertain origin. Nonspecific mediastinal adenopathy. Differential diagnostic possibilities include reactive adenopathy, metastasis, or lymphoma. 2 mm right pulmonary nodule. No follow-up needed if patient is low-risk. Non-contrast chest CT can be considered in 12 months if patient is high-risk. This recommendation follows the consensus statement: Guidelines for Management of Incidental Pulmonary Nodules Detected on CT Images:From the Fleischner Society 2017; published online before print (10.1148/radiol.SG:5268862). Two lesions at the border between the liver and right kidney both likely renal cysts. Recommend nonemergent evaluation with ultrasound to confirm this. Mild diffuse bladder wall thickening possibly related to outlet obstruction. Mild enlargement of the prostate and mild prostatic nodularity. Correlate with PSA levels. Mildly enlarged single nonspecific celiac axis lymph node. These results will be called to the ordering clinician or representative by the Radiology Department at the imaging location. Electronically Signed   By: Skipper Cliche M.D.   On: 08/20/2015 15:44   Ct Abdomen Pelvis W Contrast  08/20/2015  CLINICAL DATA:  Patient c/o fatigue and SOB worsening x 3-4 weeks. NKI. EXAM: CT ABDOMEN AND PELVIS WITH CONTRAST TECHNIQUE: Multidetector CT imaging of the abdomen and pelvis was performed using the standard protocol following bolus administration of intravenous contrast. CONTRAST:  170mL ISOVUE-300 IOPAMIDOL (ISOVUE-300) INJECTION 61% COMPARISON:  None. FINDINGS: There are small bilateral pleural effusions. Heart size normal with no significant pericardial effusion. Extensive coronary artery calcification. Patient  is status post CABG. Thoracic aorta shows mild calcifications. Central pulmonary arteries appear normal. Prominent right paratracheal lymph node measuring 14 mm but retaining a fatty hilum. Sub carinal adenopathy measures 16 mm. No acute pulmonary parenchymal abnormality. 2 mm pulmonary nodule on the right image number 30. Mild dependent atelectasis bilaterally. Focal nodular right pleural thickening is 2 mm image number 31. Mild diffuse hepatic view is.  5 mm gallstone. Pancreas and spleen are normal.  Small splenule is present. Adrenal glands are normal. Left kidney is normal. There are 2 low-attenuation lesions on the right, 1 measuring 2 cm and consistent with an upper pole renal cyst. The other measures 1.5 cm with an average attenuation value of 15. It projects over both liver and renal margin. Based on contours and is likely an exophytic renal cyst. Bilateral perinephric inflammation is likely not of acute origin. No stones or hydronephrosis. Mild bladder wall thickening. Moderate aortoiliac calcification. No ascites. Nonobstructive bowel gas pattern. No focal bowel abnormalities. Mild diverticulosis sigmoid colon. Appendix normal. No significant retroperitoneal adenopathy. There is a nonspecific 9 mm celiac axis lymph node. Prostate is mildly prominent at 5.6 cm with mild nodularity at its margin with the base of the bladder. No acute musculoskeletal findings. IMPRESSION: Small nonspecific bilateral pleural effusions of uncertain origin. Nonspecific mediastinal adenopathy. Differential diagnostic possibilities include reactive adenopathy, metastasis, or lymphoma. 2 mm right pulmonary nodule. No follow-up needed if patient is low-risk. Non-contrast chest CT can be considered in 12 months if patient is high-risk. This recommendation follows the consensus statement: Guidelines for Management of Incidental Pulmonary Nodules Detected on CT Images:From the Fleischner Society 2017; published online before print  (10.1148/radiol.SG:5268862). Two lesions at the border between the liver and right kidney both likely renal cysts. Recommend nonemergent evaluation with ultrasound to confirm this. Mild diffuse bladder wall thickening possibly related to outlet obstruction. Mild enlargement of the prostate and mild prostatic nodularity. Correlate with PSA levels. Mildly enlarged single nonspecific celiac axis lymph node. These results will be called to the ordering clinician or representative by  the Radiology Department at the imaging location. Electronically Signed   By: Skipper Cliche M.D.   On: 08/20/2015 15:44   US Renal  08/29/2015  CLINICAL DATA:  Followup renal lesions seen on recent CT scan. EXAM: RENAL / URINARY TRACT ULTRASOUND COMPLETE COMPARISON:  CT scan 08/20/2015 FINDINGS: Right Kidney: Length: 12.1 cm. Normal renal cortical thickness and echogenicity without hydronephrosis. There are 2 upper pole simple cysts as seen on the CT scan. These measure 2.1 x 1.9 x 2.7 cm and 1.6 x 1.7 x 2.2 cm. Left Kidney: Length: 12.9 cm. Normal renal cortical thickness and echogenicity without focal lesions or hydronephrosis. Bladder: Normal.  Bilateral ureteral jets are noted. IMPRESSION: Two right upper pole simple appearing renal cysts. No worrisome renal lesions or hydronephrosis. Electronically Signed   By: Marijo Sanes M.D.   On: 08/29/2015 19:34   Nm Myocar Multi W/spect W/wall Motion / Ef  08/06/2015  Pharmacological myocardial perfusion imaging study with mild ischemia noted in the mid anterior wall, though unable to exclude attenuation artifact. Both attenuation corrected and non AC images show and mild small perfusion abnormality in the anterior wall GI uptake artifact noted Normal wall motion, EF estimated at 53% No EKG changes concerning for ischemia at peak stress or in recovery. Baseline EKG with nonspecific  ST and T wave abnormality, changes more pronounced with infusion. Low to Moderate risk scan, clinical  correlation suggested. Signed, Esmond Plants, MD, Ph.D [facility]    Assessment/Plan 1.  Critical stenosis of left internal carotid artery: The patient will undergo left carotid endarterectomy. He has been cleared for surgery by cardiology. The risks and benefits of been reviewed in detail with the patient alternative therapies for treatment of also been discussed in detail. All questions have been answered patient has agreed to proceed. 2.  Chronic renal insufficiency:  Patient will be monitored for his renal function. Nephrotoxic medications will be avoided. Dehydration will be avoided. 3.  Hypertension:  Patient will continue medical management; nephrology is following no changes in oral medications. 4. Diabetes mellitus:  Glucose will be monitored and oral medications been held this morning once the patient has undergone the patient's procedure po intake will be reinitiated and again Accu-Cheks will be used to assess the blood glucose level and treat as needed. The patient will be restarted on the patient's usual hypoglycemic regime 5.  Coronary artery disease:  EKG will be monitored. Nitrates will be used if needed. [name] oral cardiac medications will be continued. 6.  Anemia: Patient's hemoglobin will be followed transfusions will be instituted as needed. Patient is aware.     Shontae Rosiles, Dolores Lory, MD  08/30/2015 7:39 AM

## 2015-08-31 LAB — BASIC METABOLIC PANEL
Anion gap: 5 (ref 5–15)
BUN: 18 mg/dL (ref 6–20)
CO2: 24 mmol/L (ref 22–32)
Calcium: 8.7 mg/dL — ABNORMAL LOW (ref 8.9–10.3)
Chloride: 103 mmol/L (ref 101–111)
Creatinine, Ser: 0.95 mg/dL (ref 0.61–1.24)
GFR calc Af Amer: >60 mL/min (ref >60)
GFR calc non Af Amer: >60 mL/min (ref >60)
Glucose, Bld: 260 mg/dL — ABNORMAL HIGH (ref 65–99)
Potassium: 4.7 mmol/L (ref 3.5–5.1)
Sodium: 132 mmol/L — ABNORMAL LOW (ref 135–145)

## 2015-08-31 LAB — CBC
HCT: 33.2 % — ABNORMAL LOW (ref 40.0–52.0)
Hemoglobin: 10.2 g/dL — ABNORMAL LOW (ref 13.0–18.0)
MCH: 21.8 pg — ABNORMAL LOW (ref 26.0–34.0)
MCHC: 30.9 g/dL — ABNORMAL LOW (ref 32.0–36.0)
MCV: 70.8 fL — ABNORMAL LOW (ref 80.0–100.0)
Platelets: 228 10*3/uL (ref 150–440)
RBC: 4.7 MIL/uL (ref 4.40–5.90)
RDW: 21.7 % — ABNORMAL HIGH (ref 11.5–14.5)
WBC: 9.4 10*3/uL (ref 3.8–10.6)

## 2015-08-31 LAB — GLUCOSE, CAPILLARY
Glucose-Capillary: 244 mg/dL — ABNORMAL HIGH (ref 65–99)
Glucose-Capillary: 281 mg/dL — ABNORMAL HIGH (ref 65–99)

## 2015-08-31 MED ORDER — OXYCODONE-ACETAMINOPHEN 5-325 MG PO TABS: 1.0000 | ORAL_TABLET | Freq: Four times a day (QID) | ORAL | Status: DC | PRN

## 2015-08-31 NOTE — Discharge Instructions (Signed)
Please do not take Coumadin until your doctors instruct you to restart. You may shower as of Sunday. Dermabond to fall off on its own. Keep incision clean and dry. No driving until follow up in one week. No driving while on pain medication.

## 2015-08-31 NOTE — Progress Notes (Signed)
[facility]  Daily Progress Note  Subjective: 1 Day Post-Op: Left Carotid Endarterectomy with Primary Closure  Patient without complaint this AM. No issues overnight. No SOB or dysphagia.   Objective: Filed Vitals:   08/31/15 0800 08/31/15 0900 08/31/15 1000 08/31/15 1100  BP: 128/56 145/54 150/76 139/57  Pulse: 55 57 63 55  Temp:      TempSrc:      Resp: 16 18 22 14   Height:      Weight:      SpO2: 93% 100% 100% 99%    Intake/Output Summary (Last 24 hours) at 08/31/15 1211 Last data filed at 08/31/15 0716  Gross per 24 hour  Intake    220 ml  Output    850 ml  Net   -630 ml   Physical Exam: A&Ox3, NAD Face: No facial drop noted. Tongue midline. Neck: Incision clean, dry and intact. No swelling, ecchymosis or draining. Dermabond intact. CV: RRR Pulmonary: CTA Bilaterally Abdomen: Soft, Nontender, Nondistended Vascular: Bilateral lower extremities warm, non-tender with minimial edema Extremities: Upper / Lower - motor / neuro intact   Laboratory: CBC    Component Value Date/Time   WBC 9.4 08/31/2015 0516   WBC 5.3 01/22/2013 0348   HGB 10.2* 08/31/2015 0516   HGB 13.2 01/22/2013 0348   HCT 33.2* 08/31/2015 0516   HCT 38.2* 01/22/2013 0348   PLT 228 08/31/2015 0516   PLT 173 01/22/2013 0348   BMET    Component Value Date/Time   NA 132* 08/31/2015 0516   NA 139 01/22/2013 0348   K 4.7 08/31/2015 0516   K 4.2 01/22/2013 0348   CL 103 08/31/2015 0516   CL 106 01/22/2013 0348   CO2 24 08/31/2015 0516   CO2 28 01/22/2013 0348   GLUCOSE 260* 08/31/2015 0516   GLUCOSE 158* 01/22/2013 0348   BUN 18 08/31/2015 0516   BUN 16 01/22/2013 0348   CREATININE 0.95 08/31/2015 0516   CREATININE 1.02 01/22/2013 0348   CALCIUM 8.7* 08/31/2015 0516   CALCIUM 8.6 01/22/2013 0348   GFRNONAA >60 08/31/2015 0516   GFRNONAA >60 01/22/2013 0348   GFRAA >60 08/31/2015 0516   GFRAA >60 01/22/2013 0348   Assessment/Planning: 70 year old male s/p 1 Day  Post-Op: Left Carotid Endarterectomy with Primary Closure - stable, doing well. Tolerating regular diet. Voiding without issue. Ambulating independently. Vitals / Labs / Physical exam within normal limits. OK to discharge home. Discussed with Dr. Ellis Parents Fernando Torry PA-C 08/31/2015 12:11 PM

## 2015-08-31 NOTE — Discharge Summary (Signed)
[facility]    Discharge Summary    Patient ID:  Seth Hardy MRN: [id number] DOB/AGE: Oct 18, 1945 70 y.o.  Admit date: 08/30/2015 Discharge date: 08/31/2015 Date of Surgery: 08/30/2015 Surgeon: Surgeon(s): Katha Cabal, MD  Admission Diagnosis: CAROTID ARTERY STENOSIS  Discharge Diagnoses:  CAROTID ARTERY STENOSIS  Secondary Diagnoses: Past Medical History  Diagnosis Date  . Hypertensive heart disease   . Hyperlipidemia   . Diabetes mellitus type II, controlled ([place])     a. Variable CBG 02/2012 - several meds adjusted.  . Mild aortic stenosis     a. 03/2011 Echo: EF 55-60%, Mild AS. b. Mild by cath 02/2012; c. 01/2014 Echo: EF 65-70%, Gr 1 DD, mild AS, mildly dil LA.  . Obesity   . GERD (gastroesophageal reflux disease)   . PAF (paroxysmal atrial fibrillation) ([place])     a. Newly dx 02/2012 & initiated on Coumadin (spont converted to NSR).  Marland Kitchen CAD (coronary artery disease)     a. 01/2010 CABG x 4: LIMA->LAD, VG->D1, VG->OM1, VG->PDA. b. NSTEMI 02/2012 in setting of AF-RVR with cath s/p BMS to RCA (plan for 1 month, possibly up to 3 months of Plavix)  . Hypertension   . Macular edema bil    lazer work done previously  . Myocardial infarction [facility]) 2014    stents (x1) at time  . Carotid disease, bilateral ([place])     a. 03/2011 U/S: 40-59% bilat Carotid dzs;  b. 04/2015 Carotid U/S: 40-59% bilat ICA stenosis; c. 05/2015 CTA Neck: [id number] RICA, [id number] LICA, mod-marked R vertebral stenosis, mod L vertebral stenosis.  . Sleep apnea     CPAP with oxygen  . Shortness of breath dyspnea   . Chronic kidney disease     small obtusion per pcp. ultrasound done on 08/29/2015  . Basal cell carcinoma of skin 2012    removed several spots from arms and back  . Anemia 08/2015    received 2 units rbc one week ago    Procedure(s): ENDARTERECTOMY CAROTID  Discharged Condition: good  HPI:  Seth Hardy is a 70 year old male who presented with critical left  carotid stenosis of 90%. On 08/30/15, the patient underwent a left carotid endarterectomy. He tolerated the procedure well and was transferred from the PACU to the ICU without issue. His night of surgery was unremarkable. Upon discharge the next day, the patient was tolerating regular diet with out any dysphagia, voiding without issue, ambulating independently and his vitals / labs / physical exam were within normal limits.  Hospital Course:  Seth Hardy is a 70 y.o. male is S/P Left Procedure(s): ENDARTERECTOMY CAROTID  Extubated: POD # 0  Physical exam:  A&Ox3, NAD Face: No facial drop noted. Tongue midline. Neck: Incision clean, dry and intact. No swelling, ecchymosis or draining. Dermabond intact. CV: RRR Pulmonary: CTA Bilaterally Abdomen: Soft, Nontender, Nondistended Vascular: Bilateral lower extremities warm, non-tender with minimial edema Extremities: Upper / Lower - motor / neuro intact  Post-op wounds clean, dry, intact or healing well Pt. Ambulating, voiding and taking PO diet without difficulty. Pt pain controlled with PO pain meds. Labs as below  Complications: None  Consults:   None  Significant Diagnostic Studies: CBC Lab Results  Component Value Date   WBC 9.4 08/31/2015   HGB 10.2* 08/31/2015   HCT 33.2* 08/31/2015   MCV 70.8* 08/31/2015   PLT 228 08/31/2015   BMET    Component Value Date/Time   NA 132* 08/31/2015  0516   NA 139 01/22/2013 0348   K 4.7 08/31/2015 0516   K 4.2 01/22/2013 0348   CL 103 08/31/2015 0516   CL 106 01/22/2013 0348   CO2 24 08/31/2015 0516   CO2 28 01/22/2013 0348   GLUCOSE 260* 08/31/2015 0516   GLUCOSE 158* 01/22/2013 0348   BUN 18 08/31/2015 0516   BUN 16 01/22/2013 0348   CREATININE 0.95 08/31/2015 0516   CREATININE 1.02 01/22/2013 0348   CALCIUM 8.7* 08/31/2015 0516   CALCIUM 8.6 01/22/2013 0348   GFRNONAA >60 08/31/2015 0516   GFRNONAA >60 01/22/2013 0348   GFRAA >60 08/31/2015 0516   GFRAA >60 01/22/2013  0348   COAG Lab Results  Component Value Date   INR 1.30 08/30/2015   INR 2.4 07/31/2015   INR 2.35 07/25/2015   Disposition:  Discharge to :Home    Medication List    STOP taking these medications        warfarin 5 MG tablet  Commonly known as:  COUMADIN      TAKE these medications        acetaminophen 325 MG tablet  Commonly known as:  TYLENOL  Take 650 mg by mouth every 6 (six) hours as needed. For pain     amiodarone 200 MG tablet  Commonly known as:  PACERONE  Take 0.5 tablets (100 mg total) by mouth daily.     amLODipine 5 MG tablet  Commonly known as:  NORVASC  Take one tablet by mouth one time daily     aspirin 81 MG EC tablet  Take 81 mg by mouth daily.     atorvastatin 20 MG tablet  Commonly known as:  LIPITOR  Take 1 tablet (20 mg total) by mouth at bedtime.     Fish Oil 1200 MG Caps  Take 1 capsule by mouth daily. Reported on 08/27/2015     furosemide 20 MG tablet  Commonly known as:  LASIX  Take 1 tablet (20 mg total) by mouth as needed.     glimepiride 4 MG tablet  Commonly known as:  AMARYL  Take 4 mg by mouth 2 (two) times daily.     insulin glargine 100 UNIT/ML injection  Commonly known as:  LANTUS  Inject 20 Units into the skin daily.     insulin lispro 100 UNIT/ML injection  Commonly known as:  HUMALOG  Inject 8 Units into the skin 2 (two) times daily with a meal.     IRON-VITAMIN C PO  Take 1 tablet by mouth daily. Reported on 08/27/2015     metFORMIN 500 MG tablet  Commonly known as:  GLUCOPHAGE  Take 500 mg by mouth 2 (two) times daily with a meal.     nitroGLYCERIN 0.4 MG SL tablet  Commonly known as:  NITROSTAT  Place 1 tablet (0.4 mg total) under the tongue every 5 (five) minutes as needed (up to 3 doses).     oxyCODONE-acetaminophen 5-325 MG tablet  Commonly known as:  PERCOCET/ROXICET  Take 1 tablet by mouth every 6 (six) hours as needed for severe pain.     OXYGEN  Inhale 2.5 L/min into the lungs at bedtime. With  CPAP     pantoprazole 40 MG tablet  Commonly known as:  PROTONIX  Take 1 tablet (40 mg total) by mouth daily.     ramipril 10 MG capsule  Commonly known as:  ALTACE  Take 1 capsule (10 mg total) by mouth daily.  Verbal and written Discharge instructions given to the patient. Wound care per Discharge AVS Follow-up Information    Follow up with Draydon Clairmont, Dolores Lory, MD On 09/09/2015.   Specialties:  Vascular Surgery, Cardiology, Radiology, Vascular Surgery   Contact information:   [place] [id number] [phone]      Signed: Sela Hua, PA-C  08/31/2015, 12:30 PM

## 2015-08-31 NOTE — Plan of Care (Signed)
Problem: Pain Managment: Goal: General experience of comfort will improve Outcome: Progressing Patient has received Morphine once tonight for incisional pain with noted relief.  Problem: Physical Regulation: Goal: Ability to maintain clinical measurements within normal limits will improve Outcome: Progressing VSS remains stable. Goal: Will remain free from infection Outcome: Not Progressing Low grade temp tonight.  Problem: Skin Integrity: Goal: Risk for impaired skin integrity will decrease Outcome: Progressing Incision remains clean, dry and intact.

## 2015-09-01 NOTE — Anesthesia Postprocedure Evaluation (Signed)
Anesthesia Post Note  Patient: Seth Hardy  Procedure(s) Performed: Procedure(s) (LRB): ENDARTERECTOMY CAROTID (Left)  Patient location during evaluation: PACU Anesthesia Type: General Level of consciousness: awake and alert and oriented Pain management: pain level controlled Vital Signs Assessment: post-procedure vital signs reviewed and stable Respiratory status: spontaneous breathing Cardiovascular status: blood pressure returned to baseline Anesthetic complications: no Comments: Patient tolerated the procedure well , and had good strength bilaterally and no lateralizing sighns.  Vocalized normally    Last Vitals:  Filed Vitals:   08/31/15 1100 08/31/15 1200  BP: 139/57 148/57  Pulse: 55 55  Temp:    Resp: 14 21    Last Pain:  Filed Vitals:   08/31/15 1256  PainSc: 0-No pain                 Fernie Grimm

## 2015-09-02 ENCOUNTER — Encounter: Payer: Self-pay | Admitting: Vascular Surgery

## 2015-09-02 LAB — SURGICAL PATHOLOGY

## 2015-09-02 NOTE — Telephone Encounter (Signed)
Called spoke with pt advised per Dr Fletcher Anon to continue to hold Coumadin until he sees Dr Fletcher Anon on 09/10/15 in follow-up.  Coumadin status will be addressed at that time.  Pt verbalized understanding.

## 2015-09-02 NOTE — Telephone Encounter (Signed)
-----   Message from Wellington Hampshire, MD sent at 08/29/2015  6:08 PM EDT ----- Keep Coumadin off for now given continued anemia. I will reevaluate during his follow up with me on April 18th. Thanks.   ----- Message -----    From: Brynda Peon, RN    Sent: 08/29/2015   7:25 AM      To: Wellington Hampshire, MD  Pt states he is off Coumadin for upcoming endarterectomy on 08/30/15, pt states you advised him to continue to hold Coumadin until after his EGD on 10/15/15.  Please advise if you instructed pt to hold his Coumadin until after procedure on 10/15/15.  Thanks

## 2015-09-03 ENCOUNTER — Ambulatory Visit: Payer: Self-pay

## 2015-09-03 ENCOUNTER — Telehealth: Payer: Self-pay | Admitting: Cardiovascular Disease

## 2015-09-03 NOTE — Telephone Encounter (Signed)
Patient c/o Palpitations:  High priority if patient c/o lightheadedness and shortness of breath.  1. How long have you been having palpitations? Last night 4 am  2. Are you currently experiencing lightheadedness and shortness of breath? no  3. Have you checked your BP and heart rate? (document readings) no  4. Are you experiencing any other symptoms? It is back in rhythm in about 5 -10 mins.    Patient is worried cause he had carotid surgery and has been off his medications.

## 2015-09-03 NOTE — Telephone Encounter (Signed)
S/w pt who reports at 4am today, his heart rate was "out of rhythm" Reports it would race for 2-3 second then resolve on its own. He took an extra 1/2 amiodarone and extra baby aspirin. Reports he is asymptomatic at this time. Reviewed s/s that would require immediate attention. Pt verbalized understanding. Confirmed 4/18 appt w/Dr. Fletcher Anon.  Pt had no further questions at this time.

## 2015-09-04 ENCOUNTER — Inpatient Hospital Stay: Payer: PPO

## 2015-09-04 VITALS — BP 133/62 | HR 50

## 2015-09-04 DIAGNOSIS — G4730 Sleep apnea, unspecified: Secondary | ICD-10-CM | POA: Diagnosis not present

## 2015-09-04 DIAGNOSIS — D508 Other iron deficiency anemias: Secondary | ICD-10-CM

## 2015-09-04 DIAGNOSIS — I119 Hypertensive heart disease without heart failure: Secondary | ICD-10-CM | POA: Diagnosis not present

## 2015-09-04 DIAGNOSIS — R5383 Other fatigue: Secondary | ICD-10-CM | POA: Diagnosis not present

## 2015-09-04 DIAGNOSIS — Z794 Long term (current) use of insulin: Secondary | ICD-10-CM | POA: Diagnosis not present

## 2015-09-04 DIAGNOSIS — D509 Iron deficiency anemia, unspecified: Secondary | ICD-10-CM | POA: Diagnosis not present

## 2015-09-04 DIAGNOSIS — Z79899 Other long term (current) drug therapy: Secondary | ICD-10-CM | POA: Diagnosis not present

## 2015-09-04 DIAGNOSIS — I480 Paroxysmal atrial fibrillation: Secondary | ICD-10-CM | POA: Diagnosis not present

## 2015-09-04 DIAGNOSIS — E785 Hyperlipidemia, unspecified: Secondary | ICD-10-CM | POA: Diagnosis not present

## 2015-09-04 DIAGNOSIS — Z800 Family history of malignant neoplasm of digestive organs: Secondary | ICD-10-CM | POA: Diagnosis not present

## 2015-09-04 DIAGNOSIS — Z7901 Long term (current) use of anticoagulants: Secondary | ICD-10-CM | POA: Diagnosis not present

## 2015-09-04 DIAGNOSIS — I350 Nonrheumatic aortic (valve) stenosis: Secondary | ICD-10-CM | POA: Diagnosis not present

## 2015-09-04 DIAGNOSIS — Z801 Family history of malignant neoplasm of trachea, bronchus and lung: Secondary | ICD-10-CM | POA: Diagnosis not present

## 2015-09-04 DIAGNOSIS — Z8669 Personal history of other diseases of the nervous system and sense organs: Secondary | ICD-10-CM | POA: Diagnosis not present

## 2015-09-04 DIAGNOSIS — I252 Old myocardial infarction: Secondary | ICD-10-CM | POA: Diagnosis not present

## 2015-09-04 DIAGNOSIS — Z8711 Personal history of peptic ulcer disease: Secondary | ICD-10-CM | POA: Diagnosis not present

## 2015-09-04 DIAGNOSIS — E669 Obesity, unspecified: Secondary | ICD-10-CM | POA: Diagnosis not present

## 2015-09-04 DIAGNOSIS — E538 Deficiency of other specified B group vitamins: Secondary | ICD-10-CM | POA: Diagnosis not present

## 2015-09-04 DIAGNOSIS — Z809 Family history of malignant neoplasm, unspecified: Secondary | ICD-10-CM | POA: Diagnosis not present

## 2015-09-04 DIAGNOSIS — K219 Gastro-esophageal reflux disease without esophagitis: Secondary | ICD-10-CM | POA: Diagnosis not present

## 2015-09-04 DIAGNOSIS — E119 Type 2 diabetes mellitus without complications: Secondary | ICD-10-CM | POA: Diagnosis not present

## 2015-09-04 DIAGNOSIS — Z7982 Long term (current) use of aspirin: Secondary | ICD-10-CM | POA: Diagnosis not present

## 2015-09-04 DIAGNOSIS — I2510 Atherosclerotic heart disease of native coronary artery without angina pectoris: Secondary | ICD-10-CM | POA: Diagnosis not present

## 2015-09-04 DIAGNOSIS — Z8052 Family history of malignant neoplasm of bladder: Secondary | ICD-10-CM | POA: Diagnosis not present

## 2015-09-04 DIAGNOSIS — N189 Chronic kidney disease, unspecified: Secondary | ICD-10-CM | POA: Diagnosis not present

## 2015-09-04 DIAGNOSIS — Z85828 Personal history of other malignant neoplasm of skin: Secondary | ICD-10-CM | POA: Diagnosis not present

## 2015-09-04 DIAGNOSIS — I129 Hypertensive chronic kidney disease with stage 1 through stage 4 chronic kidney disease, or unspecified chronic kidney disease: Secondary | ICD-10-CM | POA: Diagnosis not present

## 2015-09-04 MED ORDER — SODIUM CHLORIDE 0.9 % IV SOLN
510.0000 mg | Freq: Once | INTRAVENOUS | Status: AC
  Administered 2015-09-04: 510 mg via INTRAVENOUS
  Filled 2015-09-04: qty 17

## 2015-09-04 MED ORDER — SODIUM CHLORIDE 0.9 % IV SOLN
Freq: Once | INTRAVENOUS | Status: AC
  Administered 2015-09-04: 10:00:00 via INTRAVENOUS
  Filled 2015-09-04: qty 1000

## 2015-09-10 ENCOUNTER — Other Ambulatory Visit: Payer: Self-pay

## 2015-09-10 ENCOUNTER — Ambulatory Visit (INDEPENDENT_AMBULATORY_CARE_PROVIDER_SITE_OTHER): Payer: PPO | Admitting: Cardiovascular Disease

## 2015-09-10 ENCOUNTER — Encounter: Payer: Self-pay | Admitting: Cardiovascular Disease

## 2015-09-10 VITALS — BP 120/50 | HR 52 | Ht 68.0 in | Wt 186.0 lb

## 2015-09-10 DIAGNOSIS — I4891 Unspecified atrial fibrillation: Secondary | ICD-10-CM | POA: Diagnosis not present

## 2015-09-10 NOTE — Patient Instructions (Signed)
Medication Instructions: Continue same medications.   Labwork: None.   Procedures/Testing: None.   Follow-Up: 6 weeks with Dr. Fletcher Anon.   Any Additional Special Instructions Will Be Listed Below (If Applicable).     If you need a refill on your cardiac medications before your next appointment, please call your pharmacy.

## 2015-09-10 NOTE — Progress Notes (Signed)
Cardiology Office Note   Date:  09/10/2015   ID:  Seth Hardy, DOB 06/09/1945, MRN [id number]  PCP:  Seth Aus, MD  Cardiologist:   Kathlyn Sacramento, MD   Chief Complaint  Patient presents with  . other      History of Present Illness: Seth Hardy is a 70 y.o. male who Is here today for a follow-up visit . He has a history of coronary artery disease and is status post bypass grafting in September 2011 with subsequent bare metal stenting of the right coronary artery in 2013. He also has a history of persistent atrial fibrillation maintained in sinus rhythm on small dose amiodarone and is chronically anticoagulated on Coumadin. He has other chronic medical conditions that include aortic stenosis, hypertension, hyperlipidemia, bilateral carotid arterial disease, and diabetes.   Most recent echocardiogram was done at [facility] in 05/2015 which showed normal LV systolic function with mild to moderate aortic stenosis. Aortic valve mean gradient was 18 mmHg. The patient underwent recent left carotid endarterectomy without complications. Before that, he had worsening shortness of breath and chest pain. A nuclear stress test was low risk but could not exclude anterior wall ischemia. Cardiac catheterization was planned but his routine labs showed significant anemia with a hemoglobin below 8. Thus, the procedure was canceled and subsequently he received transfusion followed by iron infusion twice. The patient has iron deficiency anemia of unclear source. We decided to hold his warfarin until his workup is finished. He is going to have an EGD and colonoscopy next month. He reports feeling significantly better after improving his anemia with resolution of chest pain and shortness of breath.    Past Medical History  Diagnosis Date  . Hypertensive heart disease   . Hyperlipidemia   . Diabetes mellitus type II, controlled ([place])     a. Variable CBG 02/2012 - several meds adjusted.  . Mild  aortic stenosis     a. 03/2011 Echo: EF 55-60%, Mild AS. b. Mild by cath 02/2012; c. 01/2014 Echo: EF 65-70%, Gr 1 DD, mild AS, mildly dil LA.  . Obesity   . GERD (gastroesophageal reflux disease)   . PAF (paroxysmal atrial fibrillation) ([place])     a. Newly dx 02/2012 & initiated on Coumadin (spont converted to NSR).  Marland Kitchen CAD (coronary artery disease)     a. 01/2010 CABG x 4: LIMA->LAD, VG->D1, VG->OM1, VG->PDA. b. NSTEMI 02/2012 in setting of AF-RVR with cath s/p BMS to RCA (plan for 1 month, possibly up to 3 months of Plavix)  . Hypertension   . Macular edema bil    lazer work done previously  . Myocardial infarction [facility]) 2014    stents (x1) at time  . Carotid disease, bilateral ([place])     a. 03/2011 U/S: 40-59% bilat Carotid dzs;  b. 04/2015 Carotid U/S: 40-59% bilat ICA stenosis; c. 05/2015 CTA Neck: [id number] RICA, [id number] LICA, mod-marked R vertebral stenosis, mod L vertebral stenosis.  . Sleep apnea     CPAP with oxygen  . Shortness of breath dyspnea   . Chronic kidney disease     small obtusion per pcp. ultrasound done on 08/29/2015  . Basal cell carcinoma of skin 2012    removed several spots from arms and back  . Anemia 08/2015    received 2 units rbc one week ago    Past Surgical History  Procedure Laterality Date  . Cyst l kidney  [place]  .  Angioplasty    . Left heart catheterization with coronary angiogram N/A 03/09/2012    Procedure: LEFT HEART CATHETERIZATION WITH CORONARY ANGIOGRAM;  Surgeon: Wellington Hampshire, MD;  Location: [facility] CATH LAB;  Service: Cardiovascular;  Laterality: N/A;  . Percutaneous coronary stent intervention (pci-s)  03/09/2012    Procedure: PERCUTANEOUS CORONARY STENT INTERVENTION (PCI-S);  Surgeon: Wellington Hampshire, MD;  Location: [facility] CATH LAB;  Service: Cardiovascular;;  . Coronary artery bypass graft  2011    [facility], [place], [place],   . Cataract extraction Bilateral 2016    Cataract Extraction with IOL  . Cardiac catheterization  2014  . Coronary  angioplasty      Dr. Fletcher Anon  . Inguinal hernia repair Right     Right Inguinal Hernia Repair  . Endarterectomy Left 08/30/2015    Procedure: ENDARTERECTOMY CAROTID;  Surgeon: Seth Cabal, MD;  Location: [facility] ORS;  Service: Vascular;  Laterality: Left;     Current Outpatient Prescriptions  Medication Sig Dispense Refill  . acetaminophen (TYLENOL) 325 MG tablet Take 650 mg by mouth every 6 (six) hours as needed. For pain    . amiodarone (PACERONE) 200 MG tablet Take 0.5 tablets (100 mg total) by mouth daily. (Patient taking differently: Take 100 mg by mouth at bedtime. ) 45 tablet 3  . amLODipine (NORVASC) 5 MG tablet Take one tablet by mouth one time daily 90 tablet 3  . aspirin 81 MG EC tablet Take 81 mg by mouth daily.      Marland Kitchen atorvastatin (LIPITOR) 20 MG tablet Take 1 tablet (20 mg total) by mouth at bedtime. 90 tablet 3  . furosemide (LASIX) 20 MG tablet Take 1 tablet (20 mg total) by mouth as needed. 30 tablet 6  . glimepiride (AMARYL) 4 MG tablet Take 4 mg by mouth 2 (two) times daily.      . insulin glargine (LANTUS) 100 UNIT/ML injection Inject 20 Units into the skin daily.     . insulin lispro (HUMALOG) 100 UNIT/ML injection Inject 8 Units into the skin 2 (two) times daily with a meal. (Patient taking differently: Inject into the skin 3 (three) times daily. Per sliding scale.)    . IRON-VITAMIN C PO Take 1 tablet by mouth daily. Reported on 08/27/2015    . metFORMIN (GLUCOPHAGE) 500 MG tablet Take 500 mg by mouth 2 (two) times daily with a meal.    . nitroGLYCERIN (NITROSTAT) 0.4 MG SL tablet Place 1 tablet (0.4 mg total) under the tongue every 5 (five) minutes as needed (up to 3 doses). (Patient not taking: Reported on 08/30/2015) 25 tablet 1  . Omega-3 Fatty Acids (FISH OIL) 1200 MG CAPS Take 1 capsule by mouth daily. Reported on 08/27/2015    . oxyCODONE-acetaminophen (PERCOCET/ROXICET) 5-325 MG tablet Take 1 tablet by mouth every 6 (six) hours as needed for severe pain. 30 tablet 0    . OXYGEN Inhale 2.5 L/min into the lungs at bedtime. With CPAP    . pantoprazole (PROTONIX) 40 MG tablet Take 1 tablet (40 mg total) by mouth daily. (Patient taking differently: Take 40 mg by mouth 2 (two) times daily. ) 90 tablet 3  . ramipril (ALTACE) 10 MG capsule Take 1 capsule (10 mg total) by mouth daily. 90 capsule 5   No current facility-administered medications for this visit.    Allergies:   Review of patient's allergies indicates no known allergies.    Social History:  The patient  reports that he has never smoked. He has never  used smokeless tobacco. He reports that he drinks alcohol. He reports that he does not use illicit drugs.   Family History:  [name] family history includes Bladder Cancer in his mother; COPD in his mother; Cancer in his other; Heart attack in his father; Kidney disease in his paternal uncle; Liver cancer in his maternal aunt; Lung cancer in his maternal aunt.    ROS:  Please see the history of present illness.   Otherwise, review of systems are positive for none.   All other systems are reviewed and negative.    PHYSICAL EXAM: VS:  There were no vitals taken for this visit. , BMI There is no weight on file to calculate BMI. GEN: Well nourished, well developed, in no acute distress HEENT: normal Neck: no JVD, carotid bruits, or masses Cardiac: RRR; no rubs, or gallops,no edema . There is a 3/6 crescendo decrescendo systolic murmur in the aortic area which is mid peaking. The murmur radiates to the carotid arteries. Respiratory:  clear to auscultation bilaterally, normal work of breathing GI: soft, nontender, nondistended, + BS MS: no deformity or atrophy Skin: warm and dry, no rash Neuro:  Strength and sensation are intact Psych: euthymic mood, full affect   EKG:  EKG  ordered today. EKG showed sinus bradycardia with lateral T wave changes suggestive of ischemia.   Recent Labs: 06/25/2015: ALT 33 08/31/2015: BUN 18; Creatinine, Ser 0.95;  Hemoglobin 10.2*; Platelets 228; Potassium 4.7; Sodium 132*    Lipid Panel    Component Value Date/Time   CHOL 99 03/09/2012 0243   TRIG 84 03/09/2012 0243   HDL 37* 03/09/2012 0243   CHOLHDL 2.7 03/09/2012 0243   VLDL 17 03/09/2012 0243   LDLCALC 45 03/09/2012 0243      Wt Readings from Last 3 Encounters:  08/30/15 190 lb (86.183 kg)  08/27/15 192 lb 0.3 oz (87.1 kg)  08/22/15 190 lb (86.183 kg)        ASSESSMENT AND PLAN:  1.  Coronary artery disease involving bypass graft without angina:  Recent anginal symptoms were likely triggered by significant anemia. Symptoms resolved after transfusion. Continue medical therapy.  2. Bilateral carotid artery stenosis: Status post recent left carotid endarterectomy.  3. Moderate aortic stenosis: There was some progression on his echo from January of this year compared to 2015 but the stenosis is still in the moderate range. Repeat echocardiogram in January 2018.   4. Persistent atrial fibrillation: Maintaining in sinus rhythm with small dose amiodarone.  Warfarin is on hold until finishing workup for his anemia. He is scheduled for EGD and colonoscopy next month and most likely I would plan on resuming warfarin at that time.  5. Hyperlipidemia: Continue treatment with atorvastatin with a target LDL of less than 70.    Disposition:   FU with me in 6 weeks  Signed,  Kathlyn Sacramento, MD  09/10/2015 3:57 PM    [place]

## 2015-09-11 ENCOUNTER — Inpatient Hospital Stay: Payer: PPO

## 2015-09-11 DIAGNOSIS — D509 Iron deficiency anemia, unspecified: Secondary | ICD-10-CM | POA: Diagnosis not present

## 2015-09-11 LAB — HEMOGLOBIN: Hemoglobin: 11.6 g/dL — ABNORMAL LOW (ref 13.0–18.0)

## 2015-09-11 LAB — VITAMIN B12: Vitamin B-12: 455 pg/mL (ref 180–914)

## 2015-09-11 LAB — LACTATE DEHYDROGENASE: LDH: 126 U/L (ref 98–192)

## 2015-09-11 LAB — SAMPLE TO BLOOD BANK

## 2015-09-13 ENCOUNTER — Telehealth: Payer: Self-pay | Admitting: *Deleted

## 2015-09-13 ENCOUNTER — Ambulatory Visit: Payer: Self-pay | Admitting: Cardiovascular Disease

## 2015-09-13 LAB — METHYLMALONIC ACID, SERUM: Methylmalonic Acid, Quantitative: 133 nmol/L (ref 0–378)

## 2015-09-13 NOTE — Telephone Encounter (Signed)
Left msg for patient. hgb much improved. Will continue to monitor. I asked pt to keep all future/upcoming apts.

## 2015-09-13 NOTE — Telephone Encounter (Signed)
-----   Message from Cammie Sickle, MD sent at 09/12/2015  5:01 PM EDT ----- Please inform patient that hemoglobin much better;  Follow up as planned.

## 2015-09-21 DIAGNOSIS — I2510 Atherosclerotic heart disease of native coronary artery without angina pectoris: Secondary | ICD-10-CM | POA: Diagnosis not present

## 2015-09-21 DIAGNOSIS — G4733 Obstructive sleep apnea (adult) (pediatric): Secondary | ICD-10-CM | POA: Diagnosis not present

## 2015-09-21 DIAGNOSIS — R011 Cardiac murmur, unspecified: Secondary | ICD-10-CM | POA: Diagnosis not present

## 2015-09-21 DIAGNOSIS — R0689 Other abnormalities of breathing: Secondary | ICD-10-CM | POA: Diagnosis not present

## 2015-09-23 ENCOUNTER — Other Ambulatory Visit: Payer: Self-pay | Admitting: Internal Medicine

## 2015-09-23 ENCOUNTER — Inpatient Hospital Stay: Payer: PPO | Attending: Internal Medicine

## 2015-09-23 DIAGNOSIS — G4730 Sleep apnea, unspecified: Secondary | ICD-10-CM | POA: Insufficient documentation

## 2015-09-23 DIAGNOSIS — I119 Hypertensive heart disease without heart failure: Secondary | ICD-10-CM | POA: Diagnosis not present

## 2015-09-23 DIAGNOSIS — I350 Nonrheumatic aortic (valve) stenosis: Secondary | ICD-10-CM | POA: Diagnosis not present

## 2015-09-23 DIAGNOSIS — Z7982 Long term (current) use of aspirin: Secondary | ICD-10-CM | POA: Diagnosis not present

## 2015-09-23 DIAGNOSIS — D649 Anemia, unspecified: Secondary | ICD-10-CM | POA: Diagnosis not present

## 2015-09-23 DIAGNOSIS — R0602 Shortness of breath: Secondary | ICD-10-CM | POA: Diagnosis not present

## 2015-09-23 DIAGNOSIS — Z801 Family history of malignant neoplasm of trachea, bronchus and lung: Secondary | ICD-10-CM | POA: Insufficient documentation

## 2015-09-23 DIAGNOSIS — E1165 Type 2 diabetes mellitus with hyperglycemia: Secondary | ICD-10-CM | POA: Diagnosis not present

## 2015-09-23 DIAGNOSIS — E669 Obesity, unspecified: Secondary | ICD-10-CM | POA: Insufficient documentation

## 2015-09-23 DIAGNOSIS — Z8673 Personal history of transient ischemic attack (TIA), and cerebral infarction without residual deficits: Secondary | ICD-10-CM | POA: Diagnosis not present

## 2015-09-23 DIAGNOSIS — I129 Hypertensive chronic kidney disease with stage 1 through stage 4 chronic kidney disease, or unspecified chronic kidney disease: Secondary | ICD-10-CM | POA: Insufficient documentation

## 2015-09-23 DIAGNOSIS — K219 Gastro-esophageal reflux disease without esophagitis: Secondary | ICD-10-CM | POA: Insufficient documentation

## 2015-09-23 DIAGNOSIS — E113293 Type 2 diabetes mellitus with mild nonproliferative diabetic retinopathy without macular edema, bilateral: Secondary | ICD-10-CM | POA: Diagnosis not present

## 2015-09-23 DIAGNOSIS — Z800 Family history of malignant neoplasm of digestive organs: Secondary | ICD-10-CM | POA: Insufficient documentation

## 2015-09-23 DIAGNOSIS — E538 Deficiency of other specified B group vitamins: Secondary | ICD-10-CM | POA: Diagnosis not present

## 2015-09-23 DIAGNOSIS — I252 Old myocardial infarction: Secondary | ICD-10-CM | POA: Diagnosis not present

## 2015-09-23 DIAGNOSIS — Z7901 Long term (current) use of anticoagulants: Secondary | ICD-10-CM | POA: Insufficient documentation

## 2015-09-23 DIAGNOSIS — Z7984 Long term (current) use of oral hypoglycemic drugs: Secondary | ICD-10-CM | POA: Insufficient documentation

## 2015-09-23 DIAGNOSIS — J0100 Acute maxillary sinusitis, unspecified: Secondary | ICD-10-CM | POA: Diagnosis not present

## 2015-09-23 DIAGNOSIS — D509 Iron deficiency anemia, unspecified: Secondary | ICD-10-CM

## 2015-09-23 DIAGNOSIS — I2510 Atherosclerotic heart disease of native coronary artery without angina pectoris: Secondary | ICD-10-CM | POA: Insufficient documentation

## 2015-09-23 DIAGNOSIS — Z794 Long term (current) use of insulin: Secondary | ICD-10-CM | POA: Insufficient documentation

## 2015-09-23 DIAGNOSIS — Z8052 Family history of malignant neoplasm of bladder: Secondary | ICD-10-CM | POA: Diagnosis not present

## 2015-09-23 DIAGNOSIS — Z79899 Other long term (current) drug therapy: Secondary | ICD-10-CM | POA: Diagnosis not present

## 2015-09-23 DIAGNOSIS — E119 Type 2 diabetes mellitus without complications: Secondary | ICD-10-CM | POA: Insufficient documentation

## 2015-09-23 DIAGNOSIS — N189 Chronic kidney disease, unspecified: Secondary | ICD-10-CM | POA: Diagnosis not present

## 2015-09-23 DIAGNOSIS — E785 Hyperlipidemia, unspecified: Secondary | ICD-10-CM | POA: Insufficient documentation

## 2015-09-23 DIAGNOSIS — Z85828 Personal history of other malignant neoplasm of skin: Secondary | ICD-10-CM | POA: Diagnosis not present

## 2015-09-23 DIAGNOSIS — I480 Paroxysmal atrial fibrillation: Secondary | ICD-10-CM | POA: Diagnosis not present

## 2015-09-23 DIAGNOSIS — Z8711 Personal history of peptic ulcer disease: Secondary | ICD-10-CM | POA: Diagnosis not present

## 2015-09-23 LAB — IRON AND TIBC
Iron: 101 ug/dL (ref 45–182)
Saturation Ratios: 32 % (ref 17.9–39.5)
TIBC: 320 ug/dL (ref 250–450)
UIBC: 220 ug/dL

## 2015-09-23 LAB — CBC WITH DIFFERENTIAL/PLATELET
Basophils Absolute: 0 10*3/uL (ref 0–0.1)
Basophils Relative: 1 %
Eosinophils Absolute: 0.2 10*3/uL (ref 0–0.7)
Eosinophils Relative: 6 %
HCT: 35.8 % — ABNORMAL LOW (ref 40.0–52.0)
Hemoglobin: 11.5 g/dL — ABNORMAL LOW (ref 13.0–18.0)
Lymphocytes Relative: 27 %
Lymphs Abs: 0.9 10*3/uL — ABNORMAL LOW (ref 1.0–3.6)
MCH: 25.1 pg — ABNORMAL LOW (ref 26.0–34.0)
MCHC: 32.3 g/dL (ref 32.0–36.0)
MCV: 77.7 fL — ABNORMAL LOW (ref 80.0–100.0)
Monocytes Absolute: 0.4 10*3/uL (ref 0.2–1.0)
Monocytes Relative: 12 %
Neutro Abs: 1.8 10*3/uL (ref 1.4–6.5)
Neutrophils Relative %: 54 %
Platelets: 179 10*3/uL (ref 150–440)
RBC: 4.61 MIL/uL (ref 4.40–5.90)
RDW: 30.5 % — ABNORMAL HIGH (ref 11.5–14.5)
WBC: 3.3 10*3/uL — ABNORMAL LOW (ref 3.8–10.6)

## 2015-09-23 LAB — SAMPLE TO BLOOD BANK

## 2015-09-23 LAB — FERRITIN: Ferritin: 191 ng/mL (ref 24–336)

## 2015-09-24 ENCOUNTER — Ambulatory Visit: Payer: Self-pay | Admitting: Internal Medicine

## 2015-09-24 ENCOUNTER — Ambulatory Visit: Payer: Self-pay

## 2015-09-24 ENCOUNTER — Other Ambulatory Visit: Payer: Self-pay

## 2015-09-25 ENCOUNTER — Other Ambulatory Visit: Payer: Self-pay

## 2015-09-25 ENCOUNTER — Inpatient Hospital Stay (HOSPITAL_BASED_OUTPATIENT_CLINIC_OR_DEPARTMENT_OTHER): Payer: PPO | Admitting: Internal Medicine

## 2015-09-25 ENCOUNTER — Inpatient Hospital Stay: Payer: PPO

## 2015-09-25 VITALS — BP 136/67 | HR 53 | Temp 97.6°F | Resp 20 | Wt 186.5 lb

## 2015-09-25 VITALS — BP 154/69 | HR 59

## 2015-09-25 DIAGNOSIS — D509 Iron deficiency anemia, unspecified: Secondary | ICD-10-CM | POA: Diagnosis not present

## 2015-09-25 DIAGNOSIS — Z85828 Personal history of other malignant neoplasm of skin: Secondary | ICD-10-CM

## 2015-09-25 DIAGNOSIS — I2510 Atherosclerotic heart disease of native coronary artery without angina pectoris: Secondary | ICD-10-CM

## 2015-09-25 DIAGNOSIS — Z8673 Personal history of transient ischemic attack (TIA), and cerebral infarction without residual deficits: Secondary | ICD-10-CM

## 2015-09-25 DIAGNOSIS — Z8052 Family history of malignant neoplasm of bladder: Secondary | ICD-10-CM

## 2015-09-25 DIAGNOSIS — Z801 Family history of malignant neoplasm of trachea, bronchus and lung: Secondary | ICD-10-CM

## 2015-09-25 DIAGNOSIS — I480 Paroxysmal atrial fibrillation: Secondary | ICD-10-CM

## 2015-09-25 DIAGNOSIS — E669 Obesity, unspecified: Secondary | ICD-10-CM

## 2015-09-25 DIAGNOSIS — R0602 Shortness of breath: Secondary | ICD-10-CM

## 2015-09-25 DIAGNOSIS — I119 Hypertensive heart disease without heart failure: Secondary | ICD-10-CM | POA: Diagnosis not present

## 2015-09-25 DIAGNOSIS — Z794 Long term (current) use of insulin: Secondary | ICD-10-CM

## 2015-09-25 DIAGNOSIS — Z7982 Long term (current) use of aspirin: Secondary | ICD-10-CM

## 2015-09-25 DIAGNOSIS — I10 Essential (primary) hypertension: Secondary | ICD-10-CM | POA: Insufficient documentation

## 2015-09-25 DIAGNOSIS — I129 Hypertensive chronic kidney disease with stage 1 through stage 4 chronic kidney disease, or unspecified chronic kidney disease: Secondary | ICD-10-CM

## 2015-09-25 DIAGNOSIS — E119 Type 2 diabetes mellitus without complications: Secondary | ICD-10-CM

## 2015-09-25 DIAGNOSIS — Z8711 Personal history of peptic ulcer disease: Secondary | ICD-10-CM

## 2015-09-25 DIAGNOSIS — Z7984 Long term (current) use of oral hypoglycemic drugs: Secondary | ICD-10-CM

## 2015-09-25 DIAGNOSIS — N189 Chronic kidney disease, unspecified: Secondary | ICD-10-CM

## 2015-09-25 DIAGNOSIS — I252 Old myocardial infarction: Secondary | ICD-10-CM

## 2015-09-25 DIAGNOSIS — K219 Gastro-esophageal reflux disease without esophagitis: Secondary | ICD-10-CM

## 2015-09-25 DIAGNOSIS — Z7901 Long term (current) use of anticoagulants: Secondary | ICD-10-CM

## 2015-09-25 DIAGNOSIS — Z800 Family history of malignant neoplasm of digestive organs: Secondary | ICD-10-CM

## 2015-09-25 DIAGNOSIS — Z79899 Other long term (current) drug therapy: Secondary | ICD-10-CM

## 2015-09-25 DIAGNOSIS — I350 Nonrheumatic aortic (valve) stenosis: Secondary | ICD-10-CM

## 2015-09-25 DIAGNOSIS — G4730 Sleep apnea, unspecified: Secondary | ICD-10-CM

## 2015-09-25 DIAGNOSIS — E785 Hyperlipidemia, unspecified: Secondary | ICD-10-CM

## 2015-09-25 DIAGNOSIS — D508 Other iron deficiency anemias: Secondary | ICD-10-CM

## 2015-09-25 MED ORDER — SODIUM CHLORIDE 0.9 % IV SOLN
510.0000 mg | Freq: Once | INTRAVENOUS | Status: AC
  Administered 2015-09-25: 510 mg via INTRAVENOUS
  Filled 2015-09-25: qty 17

## 2015-09-25 MED ORDER — SODIUM CHLORIDE 0.9 % IV SOLN
Freq: Once | INTRAVENOUS | Status: AC
  Administered 2015-09-25: 11:00:00 via INTRAVENOUS
  Filled 2015-09-25: qty 1000

## 2015-09-25 NOTE — Progress Notes (Signed)
[facility] OFFICE PROGRESS NOTE  Patient Care Team: Rusty Aus, MD as PCP - General (Internal Medicine)   SUMMARY OF ONCOLOGIC HISTORY:  # MARCH 2017- April 2017- IRON DEFICIENCY ANEMIA ? Etiology s/p IV iron  # hx of A.fib- on coumadin-HOLD April 2017/ CAD [Dr.[name]]; Hx of gastric ulcer [Dr.Gessner];   TIA/ CEA [s/p April 2017]  History of present illness:   [name] is here for follow-up of his severe iron deficiency anemia. Patient underwent IV iron approximately month ago 2 with improvement of hemoglobin.  Patient subsequently underwent CEA. Recovery was uneventful.  Patient awaiting upper endoscopies this month. He is currently off Coumadin given the possible GI bleeds.  He continues to deny any blood in stools black stools. Appetite is good. No chest pain or shortness of breath or cough. His energy levels are improving. He has poor tolerance to by mouth iron because of constipation.    REVIEW OF SYSTEMS:  A complete 10 point review of system is done which is negative except mentioned above/history of present illness.   PAST MEDICAL HISTORY :  Past Medical History  Diagnosis Date  . Hypertensive heart disease   . Hyperlipidemia   . Diabetes mellitus type II, controlled ([place])     a. Variable CBG 02/2012 - several meds adjusted.  . Mild aortic stenosis     a. 03/2011 Echo: EF 55-60%, Mild AS. b. Mild by cath 02/2012; c. 01/2014 Echo: EF 65-70%, Gr 1 DD, mild AS, mildly dil LA.  . Obesity   . GERD (gastroesophageal reflux disease)   . PAF (paroxysmal atrial fibrillation) ([place])     a. Newly dx 02/2012 & initiated on Coumadin (spont converted to NSR).  Marland Kitchen CAD (coronary artery disease)     a. 01/2010 CABG x 4: LIMA->LAD, VG->D1, VG->OM1, VG->PDA. b. NSTEMI 02/2012 in setting of AF-RVR with cath s/p BMS to RCA (plan for 1 month, possibly up to 3 months of Plavix)  . Carotid disease, bilateral ([place])     a. 03/2011 U/S: 40-59% bilat  Carotid dzs;  b. 04/2015 Carotid U/S: 40-59% bilat ICA stenosis; c. 05/2015 CTA Neck: [id number] RICA, [id number] LICA, mod-marked R vertebral stenosis, mod L vertebral stenosis.  . Myocardial infarction ([place]) 2014  . Hypertension   . Shortness of breath dyspnea   . Sleep apnea     CPAP with oxygen  . Chronic kidney disease   . Basal cell carcinoma of skin   . Anemia   . Macular edema bil    PAST SURGICAL HISTORY :   Past Surgical History  Procedure Laterality Date  . Cyst l kidney  [place]  . Angioplasty    . Cardiac catheterization    . Left heart catheterization with coronary angiogram N/A 03/09/2012    Procedure: LEFT HEART CATHETERIZATION WITH CORONARY ANGIOGRAM;  Surgeon: Wellington Hampshire, MD;  Location: [facility] CATH LAB;  Service: Cardiovascular;  Laterality: N/A;  . Percutaneous coronary stent intervention (pci-s)  03/09/2012    Procedure: PERCUTANEOUS CORONARY STENT INTERVENTION (PCI-S);  Surgeon: Wellington Hampshire, MD;  Location: [facility] CATH LAB;  Service: Cardiovascular;;  . Coronary artery bypass graft  2011    [facility], [place], [place],   . Coronary angioplasty      Dr. Fletcher Anon  . Cataract extraction Bilateral     Cataract Extraction with IOL  . Inguinal hernia repair Right     Right Inguinal Hernia Repair    FAMILY  HISTORY :   Family History  Problem Relation Age of Onset  . COPD Mother     alive 39  . Heart attack Father     09-08-2013 deceased  . Bladder Cancer Mother   . Lung cancer Maternal Aunt   . Liver cancer Maternal Aunt   . Kidney disease Paternal Uncle   . Cancer Other     all paternal aunts and uncles    SOCIAL HISTORY:   Social History  Substance Use Topics  . Smoking status: Never Smoker   . Smokeless tobacco: Never Used     Comment: tobacco use - no  . Alcohol Use: Yes     Comment: rare drink    ALLERGIES:  has No Known Allergies.  MEDICATIONS:  Current Outpatient Prescriptions  Medication Sig Dispense Refill  . acetaminophen (TYLENOL) 325 MG  tablet Take 650 mg by mouth every 6 (six) hours as needed. For pain    . amiodarone (PACERONE) 200 MG tablet Take 0.5 tablets (100 mg total) by mouth daily. (Patient taking differently: Take 100 mg by mouth at bedtime. ) 45 tablet 3  . amLODipine (NORVASC) 5 MG tablet Take one tablet by mouth one time daily 90 tablet 3  . aspirin 81 MG EC tablet Take 81 mg by mouth daily.      Marland Kitchen atorvastatin (LIPITOR) 20 MG tablet Take 1 tablet (20 mg total) by mouth at bedtime. 90 tablet 3  . furosemide (LASIX) 20 MG tablet Take 1 tablet (20 mg total) by mouth as needed. 30 tablet 6  . glimepiride (AMARYL) 4 MG tablet Take 4 mg by mouth 2 (two) times daily.      . insulin glargine (LANTUS) 100 UNIT/ML injection Inject 20 Units into the skin daily.     . insulin lispro (HUMALOG) 100 UNIT/ML injection Inject 8 Units into the skin 2 (two) times daily with a meal. (Patient taking differently: Inject into the skin 3 (three) times daily. Per sliding scale.)    . metFORMIN (GLUCOPHAGE) 500 MG tablet Take 500 mg by mouth 2 (two) times daily with a meal.    . nitroGLYCERIN (NITROSTAT) 0.4 MG SL tablet Place 1 tablet (0.4 mg total) under the tongue every 5 (five) minutes as needed (up to 3 doses). 25 tablet 1  . OXYGEN Inhale 2.5 L/min into the lungs at bedtime. With CPAP    . pantoprazole (PROTONIX) 40 MG tablet Take 1 tablet (40 mg total) by mouth daily. (Patient taking differently: Take 40 mg by mouth 2 (two) times daily. ) 90 tablet 3  . ramipril (ALTACE) 10 MG capsule Take 1 capsule (10 mg total) by mouth daily. 90 capsule 5  . warfarin (COUMADIN) 5 MG tablet Take as directed by anticoagulation clinic 90 tablet 1  . IRON-VITAMIN C PO Take 1 tablet by mouth daily. Reported on 08/27/2015    . Omega-3 Fatty Acids (FISH OIL) 1200 MG CAPS Take 1 capsule by mouth daily. Reported on 08/27/2015     No current facility-administered medications for this visit.    PHYSICAL EXAMINATION:   BP 158/71 mmHg  Pulse 51  Temp(Src)  96.1 F (35.6 C)  Resp 18  Wt 192 lb 0.3 oz (87.1 kg)  Filed Weights   08/27/15 1015  Weight: 192 lb 0.3 oz (87.1 kg)    GENERAL: Well-nourished well-developed; Alert, no distress and comfortable. wife EYES: positive for pallor OROPHARYNX: no thrush or ulceration; good dentition  NECK: supple, no masses felt LYMPH:  no  palpable lymphadenopathy in the cervical, axillary or inguinal regions LUNGS: clear to auscultation and  No wheeze or crackles HEART/CVS: regular rate & rhythm and no murmurs; No lower extremity edema ABDOMEN:abdomen soft, non-tender and normal bowel sounds Musculoskeletal:no cyanosis of digits and no clubbing  PSYCH: alert & oriented x 3 with fluent speech NEURO: no focal motor/sensory deficits SKIN:  no rashes or significant lesions  LABORATORY DATA:  I have reviewed the data as listed    Component Value Date/Time   NA 139 07/25/2015 1138   NA 139 01/22/2013 0348   K 4.1 07/25/2015 1138   K 4.2 01/22/2013 0348   CL 103 07/25/2015 1138   CL 106 01/22/2013 0348   CO2 27 07/25/2015 1138   CO2 28 01/22/2013 0348   GLUCOSE 216* 07/25/2015 1138   GLUCOSE 158* 01/22/2013 0348   BUN 14 07/25/2015 1138   BUN 16 01/22/2013 0348   CREATININE 1.03 07/25/2015 1138   CREATININE 1.02 01/22/2013 0348   CALCIUM 9.1 07/25/2015 1138   CALCIUM 8.6 01/22/2013 0348   PROT 7.2 06/25/2015 1045   PROT 7.5 01/21/2013 0209   ALBUMIN 4.1 06/25/2015 1045   ALBUMIN 4.3 01/21/2013 0209   AST 30 06/25/2015 1045   AST 28 01/21/2013 0209   ALT 33 06/25/2015 1045   ALT 36 01/21/2013 0209   ALKPHOS 104 06/25/2015 1045   ALKPHOS 143* 01/21/2013 0209   BILITOT 0.4 06/25/2015 1045   BILITOT 0.4 01/21/2013 0209   GFRNONAA >60 07/25/2015 1138   GFRNONAA >60 01/22/2013 0348   GFRAA >60 07/25/2015 1138   GFRAA >60 01/22/2013 0348    No results found for: SPEP, UPEP  Lab Results  Component Value Date   WBC 4.5 08/22/2015   NEUTROABS 2.5 08/22/2015   HGB 8.0 Repeated and  verified X2.* 08/22/2015   HCT 26.2 Repeated and verified X2.* 08/22/2015   MCV 67.4 Repeated and verified X2.* 08/22/2015   PLT 264.0 08/22/2015      Chemistry      Component Value Date/Time   NA 139 07/25/2015 1138   NA 139 01/22/2013 0348   K 4.1 07/25/2015 1138   K 4.2 01/22/2013 0348   CL 103 07/25/2015 1138   CL 106 01/22/2013 0348   CO2 27 07/25/2015 1138   CO2 28 01/22/2013 0348   BUN 14 07/25/2015 1138   BUN 16 01/22/2013 0348   CREATININE 1.03 07/25/2015 1138   CREATININE 1.02 01/22/2013 0348      Component Value Date/Time   CALCIUM 9.1 07/25/2015 1138   CALCIUM 8.6 01/22/2013 0348   ALKPHOS 104 06/25/2015 1045   ALKPHOS 143* 01/21/2013 0209   AST 30 06/25/2015 1045   AST 28 01/21/2013 0209   ALT 33 06/25/2015 1045   ALT 36 01/21/2013 0209   BILITOT 0.4 06/25/2015 1045   BILITOT 0.4 01/21/2013 0209        ASSESSMENT & PLAN:   # Severe anemia- iron deficient- Unclear etiology; Question GI blood loss. Status post IV iron Feraheme- hemoglobin today is 11.3. However still microcytic. Recommend one more dose of IV Feraheme today.  # Question GI blood loss patient was on Coumadin before. Currently off. Patient is awaiting upper and lower endoscopies this month.  # History of A. Fib- Coumadin is currently on hold given the possible above GI bleed. As per the patient/plan to start Coumadin after endoscopies.  # Recommend follow-up in 3 months with CBC and studies and ferritin possible Feraheme if needed. He'll have labs  done a week prior.    Cammie Sickle, MD 08/27/2015 11:25 AM

## 2015-09-26 ENCOUNTER — Encounter: Payer: Self-pay | Admitting: Internal Medicine

## 2015-10-01 ENCOUNTER — Encounter: Payer: Self-pay | Admitting: Internal Medicine

## 2015-10-04 ENCOUNTER — Encounter (HOSPITAL_COMMUNITY): Payer: Self-pay | Admitting: *Deleted

## 2015-10-07 ENCOUNTER — Encounter: Payer: Self-pay | Admitting: Vascular Surgery

## 2015-10-10 ENCOUNTER — Other Ambulatory Visit: Payer: Self-pay | Admitting: Cardiovascular Disease

## 2015-10-15 ENCOUNTER — Ambulatory Visit (HOSPITAL_COMMUNITY)
Admission: RE | Admit: 2015-10-15 | Discharge: 2015-10-15 | Disposition: A | Discharge status: Home / Self Care | Payer: PPO | Source: Ambulatory Visit | Attending: Internal Medicine | Admitting: Internal Medicine

## 2015-10-15 ENCOUNTER — Encounter (HOSPITAL_COMMUNITY): Payer: Self-pay | Admitting: Anesthesiology

## 2015-10-15 ENCOUNTER — Telehealth: Payer: Self-pay | Admitting: Cardiovascular Disease

## 2015-10-15 ENCOUNTER — Ambulatory Visit (HOSPITAL_COMMUNITY): Payer: PPO | Admitting: Anesthesiology

## 2015-10-15 ENCOUNTER — Encounter (HOSPITAL_COMMUNITY)
Admission: RE | Disposition: A | Discharge status: Home / Self Care | Payer: Self-pay | Source: Ambulatory Visit | Attending: Internal Medicine

## 2015-10-15 DIAGNOSIS — Z7982 Long term (current) use of aspirin: Secondary | ICD-10-CM | POA: Diagnosis not present

## 2015-10-15 DIAGNOSIS — D509 Iron deficiency anemia, unspecified: Secondary | ICD-10-CM | POA: Diagnosis not present

## 2015-10-15 DIAGNOSIS — E1122 Type 2 diabetes mellitus with diabetic chronic kidney disease: Secondary | ICD-10-CM | POA: Insufficient documentation

## 2015-10-15 DIAGNOSIS — Z9989 Dependence on other enabling machines and devices: Secondary | ICD-10-CM | POA: Diagnosis not present

## 2015-10-15 DIAGNOSIS — Z951 Presence of aortocoronary bypass graft: Secondary | ICD-10-CM | POA: Insufficient documentation

## 2015-10-15 DIAGNOSIS — E1151 Type 2 diabetes mellitus with diabetic peripheral angiopathy without gangrene: Secondary | ICD-10-CM | POA: Diagnosis not present

## 2015-10-15 DIAGNOSIS — K317 Polyp of stomach and duodenum: Secondary | ICD-10-CM | POA: Insufficient documentation

## 2015-10-15 DIAGNOSIS — G4730 Sleep apnea, unspecified: Secondary | ICD-10-CM | POA: Diagnosis not present

## 2015-10-15 DIAGNOSIS — I4891 Unspecified atrial fibrillation: Secondary | ICD-10-CM

## 2015-10-15 DIAGNOSIS — I2510 Atherosclerotic heart disease of native coronary artery without angina pectoris: Secondary | ICD-10-CM | POA: Diagnosis not present

## 2015-10-15 DIAGNOSIS — I1310 Hypertensive heart and chronic kidney disease without heart failure, with stage 1 through stage 4 chronic kidney disease, or unspecified chronic kidney disease: Secondary | ICD-10-CM | POA: Diagnosis not present

## 2015-10-15 DIAGNOSIS — Z955 Presence of coronary angioplasty implant and graft: Secondary | ICD-10-CM | POA: Insufficient documentation

## 2015-10-15 DIAGNOSIS — I350 Nonrheumatic aortic (valve) stenosis: Secondary | ICD-10-CM | POA: Insufficient documentation

## 2015-10-15 DIAGNOSIS — K3189 Other diseases of stomach and duodenum: Secondary | ICD-10-CM | POA: Insufficient documentation

## 2015-10-15 DIAGNOSIS — Z794 Long term (current) use of insulin: Secondary | ICD-10-CM | POA: Diagnosis not present

## 2015-10-15 DIAGNOSIS — Z8711 Personal history of peptic ulcer disease: Secondary | ICD-10-CM

## 2015-10-15 DIAGNOSIS — I252 Old myocardial infarction: Secondary | ICD-10-CM | POA: Insufficient documentation

## 2015-10-15 DIAGNOSIS — Z8719 Personal history of other diseases of the digestive system: Secondary | ICD-10-CM

## 2015-10-15 DIAGNOSIS — N189 Chronic kidney disease, unspecified: Secondary | ICD-10-CM | POA: Diagnosis not present

## 2015-10-15 DIAGNOSIS — Z79891 Long term (current) use of opiate analgesic: Secondary | ICD-10-CM | POA: Insufficient documentation

## 2015-10-15 DIAGNOSIS — E785 Hyperlipidemia, unspecified: Secondary | ICD-10-CM | POA: Diagnosis not present

## 2015-10-15 DIAGNOSIS — K219 Gastro-esophageal reflux disease without esophagitis: Secondary | ICD-10-CM | POA: Diagnosis not present

## 2015-10-15 DIAGNOSIS — R531 Weakness: Secondary | ICD-10-CM

## 2015-10-15 DIAGNOSIS — I480 Paroxysmal atrial fibrillation: Secondary | ICD-10-CM | POA: Insufficient documentation

## 2015-10-15 DIAGNOSIS — Z79899 Other long term (current) drug therapy: Secondary | ICD-10-CM | POA: Insufficient documentation

## 2015-10-15 HISTORY — DX: Personal history of other medical treatment: Z92.89

## 2015-10-15 HISTORY — PX: COLONOSCOPY WITH PROPOFOL: SHX5780

## 2015-10-15 HISTORY — PX: ESOPHAGOGASTRODUODENOSCOPY (EGD) WITH PROPOFOL: SHX5813

## 2015-10-15 HISTORY — DX: Gastric ulcer, unspecified as acute or chronic, without hemorrhage or perforation: K25.9

## 2015-10-15 LAB — GLUCOSE, CAPILLARY: Glucose-Capillary: 190 mg/dL — ABNORMAL HIGH (ref 65–99)

## 2015-10-15 SURGERY — ESOPHAGOGASTRODUODENOSCOPY (EGD) WITH PROPOFOL
Anesthesia: Monitor Anesthesia Care

## 2015-10-15 MED ORDER — PROPOFOL 500 MG/50ML IV EMUL
INTRAVENOUS | Status: DC | PRN
  Administered 2015-10-15: 125 ug/kg/min via INTRAVENOUS

## 2015-10-15 MED ORDER — LIDOCAINE HCL (CARDIAC) 20 MG/ML IV SOLN
INTRAVENOUS | Status: DC | PRN
  Administered 2015-10-15: 50 mg via INTRAVENOUS

## 2015-10-15 MED ORDER — PROPOFOL 10 MG/ML IV BOLUS
INTRAVENOUS | Status: DC | PRN
  Administered 2015-10-15: 50 mg via INTRAVENOUS

## 2015-10-15 MED ORDER — AMIODARONE HCL 200 MG PO TABS: 100.0000 mg | ORAL_TABLET | Freq: Every day | ORAL | Status: DC

## 2015-10-15 MED ORDER — SODIUM CHLORIDE 0.9 % IV SOLN: INTRAVENOUS | Status: DC

## 2015-10-15 MED ORDER — LACTATED RINGERS IV SOLN
INTRAVENOUS | Status: DC
  Administered 2015-10-15: 1000 mL via INTRAVENOUS

## 2015-10-15 MED ORDER — LIDOCAINE HCL (CARDIAC) 20 MG/ML IV SOLN
INTRAVENOUS | Status: AC
  Filled 2015-10-15: qty 5

## 2015-10-15 MED ORDER — PROPOFOL 10 MG/ML IV BOLUS
INTRAVENOUS | Status: AC
  Filled 2015-10-15: qty 40

## 2015-10-15 SURGICAL SUPPLY — 25 items

## 2015-10-15 NOTE — Transfer of Care (Signed)
Immediate Anesthesia Transfer of Care Note  Patient: Seth Hardy  Procedure(s) Performed: Procedure(s): ESOPHAGOGASTRODUODENOSCOPY (EGD) WITH PROPOFOL (N/A) COLONOSCOPY WITH PROPOFOL (N/A)  Patient Location: PACU  Anesthesia Type:MAC  Level of Consciousness: awake, alert  and oriented  Airway & Oxygen Therapy: Patient Spontanous Breathing and Patient connected to nasal cannula oxygen  Post-op Assessment: Report given to RN and Post -op Vital signs reviewed and stable  Post vital signs: Reviewed and stable  Last Vitals:  Filed Vitals:   10/15/15 0845  BP: 171/47  Pulse: 54  Temp: 36.6 C  Resp: 19    Last Pain: There were no vitals filed for this visit.       Complications: No apparent anesthesia complications

## 2015-10-15 NOTE — Anesthesia Postprocedure Evaluation (Signed)
Anesthesia Post Note  Patient: Seth Hardy  Procedure(s) Performed: Procedure(s) (LRB): ESOPHAGOGASTRODUODENOSCOPY (EGD) WITH PROPOFOL (N/A) COLONOSCOPY WITH PROPOFOL (N/A)  Patient location during evaluation: PACU Anesthesia Type: MAC Level of consciousness: awake and alert Pain management: pain level controlled Vital Signs Assessment: post-procedure vital signs reviewed and stable Respiratory status: spontaneous breathing, nonlabored ventilation, respiratory function stable and patient connected to nasal cannula oxygen Cardiovascular status: stable and blood pressure returned to baseline Anesthetic complications: no    Last Vitals:  Filed Vitals:   10/15/15 1020 10/15/15 1030  BP: 140/36 180/55  Pulse: 43 45  Temp:    Resp: 22 16    Last Pain: There were no vitals filed for this visit.               [name]

## 2015-10-15 NOTE — Anesthesia Preprocedure Evaluation (Addendum)
Anesthesia Evaluation  Patient identified by MRN, date of birth, ID band Patient awake    Reviewed: Allergy & Precautions, NPO status , Patient's Chart, lab work & pertinent test results  Airway Mallampati: II  TM Distance: >3 FB Neck ROM: Full    Dental no notable dental hx.    Pulmonary shortness of breath, sleep apnea ,    Pulmonary exam normal breath sounds clear to auscultation       Cardiovascular hypertension, Pt. on medications + CAD, + Past MI and + Peripheral Vascular Disease  Normal cardiovascular exam+ dysrhythmias Atrial Fibrillation + Valvular Problems/Murmurs AS  Rhythm:Regular Rate:Normal     Neuro/Psych negative neurological ROS  negative psych ROS   GI/Hepatic Neg liver ROS, GERD  Medicated,  Endo/Other  diabetes, Type 2, Oral Hypoglycemic Agents, Insulin Dependent  Renal/GU Renal disease  negative genitourinary   Musculoskeletal negative musculoskeletal ROS (+)   Abdominal   Peds negative pediatric ROS (+)  Hematology  (+) anemia ,   Anesthesia Other Findings   Reproductive/Obstetrics negative OB ROS                             Anesthesia Physical Anesthesia Plan  ASA: III  Anesthesia Plan: MAC   Post-op Pain Management:    Induction: Intravenous  Airway Management Planned: Natural Airway  Additional Equipment:   Intra-op Plan:   Post-operative Plan:   Informed Consent: I have reviewed the patients History and Physical, chart, labs and discussed the procedure including the risks, benefits and alternatives for the proposed anesthesia with the patient or authorized representative who has indicated his/her understanding and acceptance.   Dental advisory given  Plan Discussed with: CRNA  Anesthesia Plan Comments:         Anesthesia Quick Evaluation

## 2015-10-15 NOTE — Telephone Encounter (Signed)
Per verbal from Dr. Fletcher Anon, he will determine when to resume coumadin based on colonoscopy results when available. Informed pt of MD recommendations. Pt verbalized understanding and is agreeable w/plan.

## 2015-10-15 NOTE — H&P (Signed)
[facility] History and Physical   Primary Care Physician:  Rusty Aus, MD   Reason for Procedure:   iron-deficiency anemia  Plan:    EGD and colonoscopy - The risks and benefits as well as alternatives of endoscopic procedure(s) have been discussed and reviewed. All questions answered. The patient agrees to proceed.      HPI: Seth Hardy is a 70 y.o. male with iron-deficiency anemia in setting of warfarin use, hx gastric ulcer and negative screening colonoscopy 2006. Doing well after CEA. Hgb up with blood and iron - 11.5 on 5/1 No overt or occult bleeding known. Warfarin held   Past Medical History  Diagnosis Date  . Hypertensive heart disease   . Hyperlipidemia   . Diabetes mellitus type II, controlled ([place])     a. Variable CBG 02/2012 - several meds adjusted.  . Mild aortic stenosis     a. 03/2011 Echo: EF 55-60%, Mild AS. b. Mild by cath 02/2012; c. 01/2014 Echo: EF 65-70%, Gr 1 DD, mild AS, mildly dil LA.  . Obesity   . GERD (gastroesophageal reflux disease)   . PAF (paroxysmal atrial fibrillation) ([place])     a. Newly dx 02/2012 & initiated on Coumadin (spont converted to NSR).  Marland Kitchen CAD (coronary artery disease)     a. 01/2010 CABG x 4: LIMA->LAD, VG->D1, VG->OM1, VG->PDA. b. NSTEMI 02/2012 in setting of AF-RVR with cath s/p BMS to RCA (plan for 1 month, possibly up to 3 months of Plavix)  . Hypertension   . Macular edema bil    lazer work done previously  . Myocardial infarction [facility]) 2014    stents (x1) at time  . Carotid disease, bilateral ([place])     a. 03/2011 U/S: 40-59% bilat Carotid dzs;  b. 04/2015 Carotid U/S: 40-59% bilat ICA stenosis; c. 05/2015 CTA Neck: [id number] RICA, [id number] LICA, mod-marked R vertebral stenosis, mod L vertebral stenosis.  . Shortness of breath dyspnea   . Chronic kidney disease     small obtusion per pcp. ultrasound done on 08/29/2015  . Basal cell carcinoma of skin 2012    removed several spots from arms and back  .  Transfusion history     transfusions -1 month ago -2 units  . Anemia 08/2015    received 2 units rbc one week ago, 3 IV iron infusion -last 1 week.  . Sleep apnea     CPAP settings 3 with oxygen 2.5  . Dysrhythmia     intermittent Atrial Fibrillation    Past Surgical History  Procedure Laterality Date  . Cyst l kidney  [place]  . Angioplasty    . Left heart catheterization with coronary angiogram N/A 03/09/2012    Procedure: LEFT HEART CATHETERIZATION WITH CORONARY ANGIOGRAM;  Surgeon: Wellington Hampshire, MD;  Location: [facility] CATH LAB;  Service: Cardiovascular;  Laterality: N/A;  . Percutaneous coronary stent intervention (pci-s)  03/09/2012    Procedure: PERCUTANEOUS CORONARY STENT INTERVENTION (PCI-S);  Surgeon: Wellington Hampshire, MD;  Location: [facility] CATH LAB;  Service: Cardiovascular;;  . Coronary artery bypass graft  2011    [facility], [place], [place],   . Cataract extraction Bilateral 2016    Cataract Extraction with IOL  . Cardiac catheterization  2014  . Coronary angioplasty      Dr. Fletcher Anon  . Inguinal hernia repair Right     Right Inguinal Hernia Repair  . Endarterectomy Left 08/30/2015    Procedure: ENDARTERECTOMY CAROTID;  Surgeon:  Katha Cabal, MD;  Location: [facility] ORS;  Service: Vascular;  Laterality: Left;    Prior to Admission medications   Medication Sig Start Date End Date Taking? Authorizing Provider  acetaminophen (TYLENOL) 325 MG tablet Take 650 mg by mouth every 6 (six) hours as needed. For pain   Yes Historical Provider, MD  amiodarone (PACERONE) 200 MG tablet Take 0.5 tablets (100 mg total) by mouth daily. Patient taking differently: Take 100 mg by mouth at bedtime.  10/12/14  Yes Wellington Hampshire, MD  amLODipine (NORVASC) 5 MG tablet Take one tablet by mouth one time daily Patient taking differently: Take 5 mg by mouth daily.  07/25/14  Yes Wellington Hampshire, MD  aspirin 81 MG EC tablet Take 81 mg by mouth daily.     Yes Historical Provider, MD   atorvastatin (LIPITOR) 20 MG tablet TAKE ONE TABLET BY MOUTH AT BEDTIME 10/10/15  Yes Wellington Hampshire, MD  Cyanocobalamin (B-12) 1000 MCG CAPS Take 1,000 mcg by mouth daily.    Yes Historical Provider, MD  furosemide (LASIX) 20 MG tablet Take 1 tablet (20 mg total) by mouth as needed. Patient taking differently: Take 20 mg by mouth as needed for fluid or edema.  04/16/15  Yes Wellington Hampshire, MD  glimepiride (AMARYL) 4 MG tablet Take 4 mg by mouth 2 (two) times daily.     Yes Historical Provider, MD  insulin glargine (LANTUS) 100 UNIT/ML injection Inject 20 Units into the skin daily.    Yes Historical Provider, MD  insulin lispro (HUMALOG) 100 UNIT/ML injection Inject 8 Units into the skin 2 (two) times daily with a meal. Patient taking differently: Inject into the skin 3 (three) times daily with meals. Per sliding scale. 03/10/12  Yes Dayna N Dunn, PA-C  metFORMIN (GLUCOPHAGE) 500 MG tablet Take 500 mg by mouth 2 (two) times daily with a meal.  09/17/15  Yes Historical Provider, MD  Omega-3 Fatty Acids (FISH OIL) 1200 MG CAPS Take 1,200 mg by mouth daily. Reported on 08/27/2015   Yes Historical Provider, MD  oxyCODONE-acetaminophen (PERCOCET/ROXICET) 5-325 MG tablet Take 1 tablet by mouth every 6 (six) hours as needed for severe pain. 08/31/15  Yes Kimberly A Stegmayer, PA-C  OXYGEN Inhale 2.5 L/min into the lungs at bedtime. With CPAP   Yes Historical Provider, MD  pantoprazole (PROTONIX) 40 MG tablet Take 1 tablet (40 mg total) by mouth daily. 06/11/14  Yes Wellington Hampshire, MD  ramipril (ALTACE) 10 MG capsule Take 10 mg by mouth daily.  09/17/15  Yes Historical Provider, MD  vitamin C (ASCORBIC ACID) 500 MG tablet Take 500 mg by mouth daily.   Yes Historical Provider, MD  nitroGLYCERIN (NITROSTAT) 0.4 MG SL tablet Place 1 tablet (0.4 mg total) under the tongue every 5 (five) minutes as needed (up to 3 doses). Patient taking differently: Place 0.4 mg under the tongue every 5 (five) minutes as needed  for chest pain (up to 3 doses).  03/14/15   Wellington Hampshire, MD    Current Facility-Administered Medications  Medication Dose Route Frequency Provider Last Rate Last Dose  . 0.9 %  sodium chloride infusion   Intravenous Continuous Amy S Esterwood, PA-C      . lactated ringers infusion   Intravenous Continuous Gatha Mayer, MD 125 mL/hr at 10/15/15 0858 1,000 mL at 10/15/15 0858    Allergies as of 08/22/2015  . (No Known Allergies)    Family History  Problem Relation Age of Onset  .  COPD Mother     alive 40  . Heart attack Father     2013/08/26 deceased  . Bladder Cancer Mother   . Lung cancer Maternal Aunt   . Liver cancer Maternal Aunt   . Kidney disease Paternal Uncle   . Cancer Other     all paternal aunts and uncles    Social History   Social History  . Marital Status: Married    Spouse Name: N/A  . Number of Children: 2  . Years of Education: N/A   Occupational History  . retired    Social History Main Topics  . Smoking status: Never Smoker   . Smokeless tobacco: Never Used     Comment: tobacco use - no.no passive smoke in home  . Alcohol Use: Yes     Comment: rare drink  . Drug Use: No  . Sexual Activity: Yes   Other Topics Concern  . Not on file   Social History Narrative   Full time. Gets regular exercise. Lives in [place] with his wife.  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    Review of Systems: As above All other review of systems negative except as mentioned in the HPI.  Physical Exam: Vital signs in last 24 hours: Temp:  [97.8 F (36.6 C)] 97.8 F (36.6 C) (05/23 0845) Pulse Rate:  [54] 54 (05/23 0845) Resp:  [19] 19 (05/23 0845) BP: (171)/(47) 171/47 mmHg (05/23 0845) SpO2:  [99 %] 99 % (05/23 0845) Weight:  [186 lb (84.369 kg)] 186 lb (84.369 kg) (05/23 0845)   General:   Alert,  Well-developed, well-nourished, pleasant and cooperative in NAD Lungs:  Clear throughout to auscultation.   Heart:  Regular rate and rhythm; no murmurs,  clicks, rubs,  or gallops. Abdomen:  Soft, nontender and nondistended. Normal bowel sounds.   Neuro/Psych:  Alert and cooperative. Normal mood and affect. A and O x 3   @Carl  Simonne Maffucci, MD, [facility] [phone] (pager) 10/15/2015 9:24 AM@

## 2015-10-15 NOTE — Op Note (Signed)
[facility] Patient Name: Seth Hardy Procedure Date: 10/15/2015 MRN: [id number] Attending MD: Gatha Mayer , MD Date of Birth: 12-25-1945 CSN: [id number] Age: 70 Admit Type: Outpatient Procedure:                Colonoscopy Indications:              Unexplained iron deficiency anemia Providers:                Gatha Mayer, MD, Vista Lawman, RN, Tory Emerald, RN, William Dalton, Technician, [facility], CRNA Referring MD:              Medicines:                Propofol per Anesthesia, Monitored Anesthesia Care Complications:            No immediate complications. Estimated blood loss:                            None. Estimated Blood Loss:     Estimated blood loss: none. Procedure:                Pre-Anesthesia Assessment:                           - Prior to the procedure, a History and Physical                            was performed, and patient medications and                            allergies were reviewed. [name] tolerance of                            previous anesthesia was also reviewed. The risks                            and benefits of the procedure and the sedation                            options and risks were discussed with the patient.                            All questions were answered, and informed consent                            was obtained. Prior Anticoagulants: The patient                            last took aspirin 1 day prior to the procedure. ASA  Grade Assessment: III - A patient with severe                            systemic disease. After reviewing the risks and                            benefits, the patient was deemed in satisfactory                            condition to undergo the procedure.                           After obtaining informed consent, the colonoscope                            was passed under direct  vision. Throughout the                            procedure, the patient's blood pressure, pulse, and                            oxygen saturations were monitored continuously. The                            [id number] scope was introduced through                            the anus and advanced to the the cecum, identified                            by appendiceal orifice and ileocecal valve. The                            ileocecal valve, appendiceal orifice, and rectum                            were photographed. The quality of the bowel                            preparation was good. The colonoscopy was performed                            without difficulty. The patient tolerated the                            procedure well. The bowel preparation used was                            Miralax. Scope In: 9:48:56 AM Scope Out: 10:02:37 AM Scope Withdrawal Time: 0 hours 10 minutes 54 seconds  Total Procedure Duration: 0 hours 13 minutes 41 seconds  Findings:      The colon (entire examined portion) appeared normal. Impression:                Moderate Sedation:  Please see anesthesia notes, moderate sedation not given Recommendation:           - Patient has a contact number available for                            emergencies. The signs and symptoms of potential                            delayed complications were discussed with the                            patient. Return to normal activities tomorrow.                            Written discharge instructions were provided to the                            patient.                           - Resume previous diet.                           - Continue present medications.                           - No repeat colonoscopy due to age. Procedure Code(s):        --- Professional ---                           [phone], Colonoscopy, flexible; diagnostic, including                            collection of specimen(s) by  brushing or washing,                            when performed (separate procedure) Diagnosis Code(s):        --- Professional ---                           D50.9, Iron deficiency anemia, unspecified CPT copyright 2016 American Medical Association. All rights reserved. The codes documented in this report are preliminary and upon coder review may  be revised to meet current compliance requirements. Gatha Mayer, MD 10/15/2015 10:21:45 AM This report has been signed electronically. Number of Addenda: 0

## 2015-10-15 NOTE — Discharge Instructions (Signed)
° °  I found and removed a benign-appearing polyp from the stomach. Looks like it was leaking blood. All else ok.  Its ok to restart warfarin - talk to dr. Fletcher Anon or whomever prescribes that to restart.  I will let you know pathology results and any needed follow-up.  I appreciate the opportunity to care for you. Gatha Mayer, MD, FACG  YOU HAD AN ENDOSCOPIC PROCEDURE TODAY: Refer to the procedure report and other information in the discharge instructions given to you for any specific questions about what was found during the examination. If this information does not answer your questions, please call Dr. Celesta Aver office at [phone] to clarify.   YOU SHOULD EXPECT: Some feelings of bloating in the abdomen. Passage of more gas than usual. Walking can help get rid of the air that was put into your GI tract during the procedure and reduce the bloating. If you had a lower endoscopy (such as a colonoscopy or flexible sigmoidoscopy) you may notice spotting of blood in your stool or on the toilet paper. Some abdominal soreness may be present for a day or two, also.  DIET: Your first meal following the procedure should be a light meal and then it is ok to progress to your normal diet. A half-sandwich or bowl of soup is an example of a good first meal. Heavy or fried foods are harder to digest and may make you feel nauseous or bloated. Drink plenty of fluids but you should avoid alcoholic beverages for 24 hours.   ACTIVITY: Your care partner should take you home directly after the procedure. You should plan to take it easy, moving slowly for the rest of the day. You can resume normal activity the day after the procedure however YOU SHOULD NOT DRIVE, use power tools, machinery or perform tasks that involve climbing or major physical exertion for 24 hours (because of the sedation medicines used during the test).   SYMPTOMS TO REPORT IMMEDIATELY: A gastroenterologist can be reached at any hour.  Please call [phone]  for any of the following symptoms:  Following lower endoscopy (colonoscopy, flexible sigmoidoscopy) Excessive amounts of blood in the stool  Significant tenderness, worsening of abdominal pains  Swelling of the abdomen that is new, acute  Fever of 100 or higher  Following upper endoscopy (EGD, EUS, ERCP, esophageal dilation) Vomiting of blood or coffee ground material  New, significant abdominal pain  New, significant chest pain or pain under the shoulder blades  Painful or persistently difficult swallowing  New shortness of breath  Black, tarry-looking or red, bloody stools  FOLLOW UP:  If any biopsies were taken you will be contacted by phone or by letter within the next 1-3 weeks. Call [phone]  if you have not heard about the biopsies in 3 weeks.  Please also call with any specific questions about appointments or follow up tests.

## 2015-10-15 NOTE — Telephone Encounter (Signed)
Pt calling stating he had his colonoscopy this morning Just needs to know when to start back on his Warfrin Please call back.

## 2015-10-15 NOTE — Op Note (Signed)
[facility] Patient Name: Seth Hardy Procedure Date: 10/15/2015 MRN: [id number] Attending MD: Gatha Mayer , MD Date of Birth: 1945-07-16 CSN: [id number] Age: 70 Admit Type: Outpatient Procedure:                Upper GI endoscopy Indications:              Iron deficiency anemia Providers:                Gatha Mayer, MD, Vista Lawman, RN, Tory Emerald, RN, William Dalton, Technician, [facility], CRNA Referring MD:              Medicines:                Monitored Anesthesia Care Complications:            No immediate complications. Estimated Blood Loss:     Estimated blood loss was minimal. Procedure:                Pre-Anesthesia Assessment:                           - Prior to the procedure, a History and Physical                            was performed, and patient medications and                            allergies were reviewed. [name] tolerance of                            previous anesthesia was also reviewed. The risks                            and benefits of the procedure and the sedation                            options and risks were discussed with the patient.                            All questions were answered, and informed consent                            was obtained. Prior Anticoagulants: The patient                            last took aspirin 1 day prior to the procedure. ASA                            Grade Assessment: III - A patient with severe  systemic disease. After reviewing the risks and                            benefits, the patient was deemed in satisfactory                            condition to undergo the procedure.                           After obtaining informed consent, the endoscope was                            passed under direct vision. Throughout the                            procedure, the patient's blood  pressure, pulse, and                            oxygen saturations were monitored continuously. The                            was introduced through the mouth, and advanced to                            the second part of duodenum. The upper GI endoscopy                            was accomplished without difficulty. The patient                            tolerated the procedure well. Scope In: Scope Out: Findings:      A single 12 mm semi-sessile polyp with stigmata of recent bleeding was       found in the cardia. The polyp was removed with a hot snare. Resection       and retrieval were complete. Verification of patient identification for       the specimen was done. Estimated blood loss was minimal. To prevent       bleeding after the polypectomy, one hemostatic clip was successfully       placed (MR conditional). There was no bleeding at the end of the       procedure.      Patchy moderately erythematous mucosa without bleeding was found in the       prepyloric region of the stomach.      The exam was otherwise without abnormality.      The cardia and gastric fundus were normal on retroflexion. Impression:               - A single gastric polyp. Resected and retrieved.                            Clip (MR conditional) was placed.                           - Erythematous mucosa in the prepyloric region of  the stomach.                           - The examination was otherwise normal. Moderate Sedation:      Please see anesthesia notes, moderate sedation not given Recommendation:           - The patient will be observed post-procedure,                            until all discharge criteria are met.                           - Patient has a contact number available for                            emergencies. The signs and symptoms of potential                            delayed complications were discussed with the                            patient.  Return to normal activities tomorrow.                            Written discharge instructions were provided to the                            patient.                           - Resume previous diet.                           - Continue present medications.                           - See the other procedure note for documentation of                            additional recommendations. Procedure Code(s):        --- Professional ---                           [phone], Esophagogastroduodenoscopy, flexible,                            transoral; with removal of tumor(s), polyp(s), or                            other lesion(s) by snare technique Diagnosis Code(s):        --- Professional ---                           K31.7, Polyp of stomach and duodenum                           K31.89, Other  diseases of stomach and duodenum                           D50.9, Iron deficiency anemia, unspecified CPT copyright 2016 American Medical Association. All rights reserved. The codes documented in this report are preliminary and upon coder review may  be revised to meet current compliance requirements. Gatha Mayer, MD 10/15/2015 10:17:11 AM This report has been signed electronically. Number of Addenda: 0

## 2015-10-16 ENCOUNTER — Telehealth: Payer: Self-pay

## 2015-10-16 ENCOUNTER — Encounter (HOSPITAL_COMMUNITY): Payer: Self-pay | Admitting: Internal Medicine

## 2015-10-16 NOTE — Telephone Encounter (Signed)
-----   Message from Gatha Mayer, MD sent at 10/15/2015 10:22 AM EDT ----- Regarding: cc please Please print and fax copies of egd and colonospy to dr. Emily Filbert at [facility]

## 2015-10-16 NOTE — Telephone Encounter (Signed)
Faxed procedure reports to Dr Emily Filbert at fax# [phone] as requested.

## 2015-10-17 NOTE — Telephone Encounter (Signed)
I sent a message to Dr. Carlean Purl to make sure that is ok with him.

## 2015-10-17 NOTE — Progress Notes (Signed)
Quick Note:  Correction: gastric not colon polyp ______

## 2015-10-17 NOTE — Telephone Encounter (Signed)
Colonoscopy and upper endoscopy results are complete. Forward to MD to review and advise regarding coumadin restart

## 2015-10-17 NOTE — Telephone Encounter (Signed)
Per staff message from Dr. Fletcher Anon: "Can resume Warfarin now." S/w Ivin Booty in [facility] coumadin clinic who instructed to restart coumadin today, 5mg  daily except 2.5mg  on Tuesday and Saturday. Check INR May 31. Repeated instructions back to [place] who confirmed dosage.  S/w pt to inform him of restart coumadin and dosage. He repeated back to me.  Confirmed INR check May 31, 12:20 in [place] office. He is agreeable w/plan w/no further questions.

## 2015-10-17 NOTE — Progress Notes (Signed)
Quick Note:  Hyperplastic colon polyp - not usually precancerous but was removed anyway F/u me prn and PCP as planned My chart message ______

## 2015-10-21 DIAGNOSIS — R0689 Other abnormalities of breathing: Secondary | ICD-10-CM | POA: Diagnosis not present

## 2015-10-21 DIAGNOSIS — G4733 Obstructive sleep apnea (adult) (pediatric): Secondary | ICD-10-CM | POA: Diagnosis not present

## 2015-10-21 DIAGNOSIS — R011 Cardiac murmur, unspecified: Secondary | ICD-10-CM | POA: Diagnosis not present

## 2015-10-21 DIAGNOSIS — I2510 Atherosclerotic heart disease of native coronary artery without angina pectoris: Secondary | ICD-10-CM | POA: Diagnosis not present

## 2015-10-24 DIAGNOSIS — E113511 Type 2 diabetes mellitus with proliferative diabetic retinopathy with macular edema, right eye: Secondary | ICD-10-CM | POA: Diagnosis not present

## 2015-10-24 DIAGNOSIS — E113592 Type 2 diabetes mellitus with proliferative diabetic retinopathy without macular edema, left eye: Secondary | ICD-10-CM | POA: Diagnosis not present

## 2015-10-25 ENCOUNTER — Encounter: Payer: Self-pay | Admitting: Cardiovascular Disease

## 2015-10-25 ENCOUNTER — Ambulatory Visit (INDEPENDENT_AMBULATORY_CARE_PROVIDER_SITE_OTHER): Payer: PPO | Admitting: Cardiovascular Disease

## 2015-10-25 ENCOUNTER — Encounter (INDEPENDENT_AMBULATORY_CARE_PROVIDER_SITE_OTHER): Payer: PPO

## 2015-10-25 VITALS — BP 134/64 | HR 48 | Ht 68.0 in | Wt 187.5 lb

## 2015-10-25 DIAGNOSIS — Z7901 Long term (current) use of anticoagulants: Secondary | ICD-10-CM

## 2015-10-25 DIAGNOSIS — I4891 Unspecified atrial fibrillation: Secondary | ICD-10-CM | POA: Diagnosis not present

## 2015-10-25 DIAGNOSIS — I2510 Atherosclerotic heart disease of native coronary artery without angina pectoris: Secondary | ICD-10-CM | POA: Diagnosis not present

## 2015-10-25 LAB — POCT INR: INR: 1.9

## 2015-10-25 NOTE — Progress Notes (Signed)
Cardiology Office Note   Date:  10/25/2015   ID:  Seth Hardy, DOB 1946/05/08, MRN [id number]  PCP:  Seth Aus, MD  Cardiologist:   Seth Sacramento, MD   Chief Complaint  Patient presents with  . other    6 wk f/u no complaints. Meds reviewed verbally .      History of Present Illness: Seth Hardy is a 70 y.o. male who Is here today for a follow-up visit . He has a history of coronary artery disease and is status post bypass grafting in September 2011 with subsequent bare metal stenting of the right coronary artery in 2013. He also has a history of persistent atrial fibrillation maintained in sinus rhythm on small dose amiodarone and is chronically anticoagulated on Coumadin. He has other chronic medical conditions that include aortic stenosis, hypertension, hyperlipidemia, bilateral carotid arterial disease Status post left carotid endarterectomy in April 2017, and diabetes.   Most recent echocardiogram was done at [facility] in 05/2015 which showed normal LV systolic function with mild to moderate aortic stenosis. Aortic valve mean gradient was 18 mmHg.  He had recent iron deficiency anemia. He underwent GI evaluation and was found to have a gastric polyp which was clipped. This was thought to be the source of bleeding. He underwent  intravenous iron treatment with subsequent improvement and hemoglobin. Warfarin was resumed recently with no complications. He is doing significantly better with no chest pain. His shortness of breath and fatigue have improved significantly.  Past Medical History  Diagnosis Date  . Hypertensive heart disease   . Hyperlipidemia   . Diabetes mellitus type II, controlled ([place])     a. Variable CBG 02/2012 - several meds adjusted.  . Mild aortic stenosis     a. 03/2011 Echo: EF 55-60%, Mild AS. b. Mild by cath 02/2012; c. 01/2014 Echo: EF 65-70%, Gr 1 DD, mild AS, mildly dil LA.  . Obesity   . GERD (gastroesophageal reflux disease)   . PAF  (paroxysmal atrial fibrillation) ([place])     a. Newly dx 02/2012 & initiated on Coumadin (spont converted to NSR).  Marland Kitchen CAD (coronary artery disease)     a. 01/2010 CABG x 4: LIMA->LAD, VG->D1, VG->OM1, VG->PDA. b. NSTEMI 02/2012 in setting of AF-RVR with cath s/p BMS to RCA (plan for 1 month, possibly up to 3 months of Plavix)  . Hypertension   . Macular edema bil    lazer work done previously  . Myocardial infarction [facility]) 2014    stents (x1) at time  . Carotid disease, bilateral ([place])     a. 03/2011 U/S: 40-59% bilat Carotid dzs;  b. 04/2015 Carotid U/S: 40-59% bilat ICA stenosis; c. 05/2015 CTA Neck: [id number] RICA, [id number] LICA, mod-marked R vertebral stenosis, mod L vertebral stenosis.  . Shortness of breath dyspnea   . Chronic kidney disease     small obtusion per pcp. ultrasound done on 08/29/2015  . Basal cell carcinoma of skin 2012    removed several spots from arms and back  . Transfusion history     transfusions -1 month ago -2 units  . Anemia 08/2015    received 2 units rbc one week ago, 3 IV iron infusion -last 1 week.  . Sleep apnea     CPAP settings 3 with oxygen 2.5  . Dysrhythmia     intermittent Atrial Fibrillation  . Multiple gastric ulcers 2006    Past Surgical History  Procedure Laterality Date  . Cyst l  kidney  [place]  . Angioplasty    . Left heart catheterization with coronary angiogram N/A 03/09/2012    Procedure: LEFT HEART CATHETERIZATION WITH CORONARY ANGIOGRAM;  Surgeon: Seth Hampshire, MD;  Location: [facility] CATH LAB;  Service: Cardiovascular;  Laterality: N/A;  . Percutaneous coronary stent intervention (pci-s)  03/09/2012    Procedure: PERCUTANEOUS CORONARY STENT INTERVENTION (PCI-S);  Surgeon: Seth Hampshire, MD;  Location: [facility] CATH LAB;  Service: Cardiovascular;;  . Coronary artery bypass graft  2011    [facility], [place], [place],   . Cataract extraction Bilateral 2016    Cataract Extraction with IOL  . Cardiac catheterization  2014  . Coronary  angioplasty      Dr. Fletcher Hardy  . Inguinal hernia repair Right     Right Inguinal Hernia Repair  . Endarterectomy Left 08/30/2015    Procedure: ENDARTERECTOMY CAROTID;  Surgeon: Seth Cabal, MD;  Location: [facility] ORS;  Service: Vascular;  Laterality: Left;  . Esophagogastroduodenoscopy (egd) with propofol N/A 10/15/2015    Procedure: ESOPHAGOGASTRODUODENOSCOPY (EGD) WITH PROPOFOL;  Surgeon: Seth Mayer, MD;  Location: [facility] ENDOSCOPY;  Service: Endoscopy;  Laterality: N/A;  . Colonoscopy with propofol N/A 10/15/2015    Procedure: COLONOSCOPY WITH PROPOFOL;  Surgeon: Seth Mayer, MD;  Location: [facility] ENDOSCOPY;  Service: Endoscopy;  Laterality: N/A;     Current Outpatient Prescriptions  Medication Sig Dispense Refill  . acetaminophen (TYLENOL) 325 MG tablet Take 650 mg by mouth every 6 (six) hours as needed. For pain    . amiodarone (PACERONE) 200 MG tablet Take 0.5 tablets (100 mg total) by mouth at bedtime. 45 tablet 3  . amLODipine (NORVASC) 5 MG tablet Take one tablet by mouth one time daily (Patient taking differently: Take 5 mg by mouth daily. ) 90 tablet 3  . aspirin 81 MG EC tablet Take 81 mg by mouth daily.      Marland Kitchen atorvastatin (LIPITOR) 20 MG tablet TAKE ONE TABLET BY MOUTH AT BEDTIME 90 tablet 3  . Cyanocobalamin (B-12) 1000 MCG CAPS Take 1,000 mcg by mouth daily.     . furosemide (LASIX) 20 MG tablet Take 1 tablet (20 mg total) by mouth as needed for fluid or edema. 30 tablet 6  . glimepiride (AMARYL) 4 MG tablet Take 4 mg by mouth 2 (two) times daily.      . insulin glargine (LANTUS) 100 UNIT/ML injection Inject 20 Units into the skin daily.     . insulin lispro (HUMALOG) 100 UNIT/ML injection Per sliding scale. 10 mL 11  . metFORMIN (GLUCOPHAGE) 500 MG tablet Take 500 mg by mouth 2 (two) times daily with a meal.     . nitroGLYCERIN (NITROSTAT) 0.4 MG SL tablet Place 1 tablet (0.4 mg total) under the tongue every 5 (five) minutes as needed (up to 3 doses). (Patient taking  differently: Place 0.4 mg under the tongue every 5 (five) minutes as needed for chest pain (up to 3 doses). ) 25 tablet 1  . Omega-3 Fatty Acids (FISH OIL) 1200 MG CAPS Take 1,200 mg by mouth daily. Reported on 08/27/2015    . oxyCODONE-acetaminophen (PERCOCET/ROXICET) 5-325 MG tablet Take 1 tablet by mouth every 6 (six) hours as needed for severe pain. 30 tablet 0  . OXYGEN Inhale 2.5 L/min into the lungs at bedtime. With CPAP    . pantoprazole (PROTONIX) 40 MG tablet Take 1 tablet (40 mg total) by mouth daily. 90 tablet 3  . ramipril (ALTACE) 10  MG capsule Take 10 mg by mouth daily.     . vitamin C (ASCORBIC ACID) 500 MG tablet Take 500 mg by mouth daily.     No current facility-administered medications for this visit.    Allergies:   Review of patient's allergies indicates no known allergies.    Social History:  The patient  reports that he has never smoked. He has never used smokeless tobacco. He reports that he drinks alcohol. He reports that he does not use illicit drugs.   Family History:  [name] family history includes Bladder Cancer in his mother; COPD in his mother; Cancer in his other; Heart attack in his father; Kidney disease in his paternal uncle; Liver cancer in his maternal aunt; Lung cancer in his maternal aunt.    ROS:  Please see the history of present illness.   Otherwise, review of systems are positive for none.   All other systems are reviewed and negative.    PHYSICAL EXAM: VS:  BP 134/64 mmHg  Pulse 48  Ht 5\' 8"  (1.727 m)  Wt 187 lb 8 oz (85.049 kg)  BMI 28.52 kg/m2 , BMI Body mass index is 28.52 kg/(m^2). GEN: Well nourished, well developed, in no acute distress HEENT: normal Neck: no JVD, carotid bruits, or masses Cardiac: RRR; no rubs, or gallops,no edema . There is a 3/6 crescendo decrescendo systolic murmur in the aortic area which is mid peaking. The murmur radiates to the carotid arteries. Respiratory:  clear to auscultation bilaterally, normal work  of breathing GI: soft, nontender, nondistended, + BS MS: no deformity or atrophy Skin: warm and dry, no rash Neuro:  Strength and sensation are intact Psych: euthymic mood, full affect   EKG:  EKG  ordered today. EKG showed sinus bradycardia with lateral T wave changes suggestive of ischemia.   Recent Labs: 06/25/2015: ALT 33 08/31/2015: BUN 18; Creatinine, Ser 0.95; Potassium 4.7; Sodium 132* 09/23/2015: Hemoglobin 11.5*; Platelets 179    Lipid Panel    Component Value Date/Time   CHOL 99 03/09/2012 0243   TRIG 84 03/09/2012 0243   HDL 37* 03/09/2012 0243   CHOLHDL 2.7 03/09/2012 0243   VLDL 17 03/09/2012 0243   LDLCALC 45 03/09/2012 0243      Wt Readings from Last 3 Encounters:  10/25/15 187 lb 8 oz (85.049 kg)  10/15/15 186 lb (84.369 kg)  09/25/15 186 lb 8.2 oz (84.6 kg)        ASSESSMENT AND PLAN:  1.  Coronary artery disease involving bypass graft without angina:  He is doing much better after treatment of his anemia.Continue medical therapy.  2. Bilateral carotid artery stenosis: Status post  left carotid endarterectomy.  3. Moderate aortic stenosis: There was some progression on his echo from January of this year compared to 2015 but the stenosis is still in the moderate range. Repeat echocardiogram in January 2018.   4. Persistent atrial fibrillation: Maintaining in sinus rhythm with small dose amiodarone.   Warfarin was resumed recently. He had liver function test and thyroid function checked in the last 6 months which were unremarkable.  5. Hyperlipidemia: Continue treatment with atorvastatin with a target LDL of less than 70.    Disposition:   FU with me in 6 months  Signed,  Seth Sacramento, MD  10/25/2015 3:11 PM    [place]

## 2015-10-25 NOTE — Patient Instructions (Signed)
Medication Instructions: Continue same medications.   Labwork: None.   Procedures/Testing: None.   Follow-Up: 6 months with Dr. [name].   Any Additional Special Instructions Will Be Listed Below (If Applicable).     If you need a refill on your cardiac medications before your next appointment, please call your pharmacy.   

## 2015-10-30 ENCOUNTER — Ambulatory Visit (INDEPENDENT_AMBULATORY_CARE_PROVIDER_SITE_OTHER): Payer: PPO

## 2015-10-30 DIAGNOSIS — Z7901 Long term (current) use of anticoagulants: Secondary | ICD-10-CM | POA: Diagnosis not present

## 2015-10-30 DIAGNOSIS — I4891 Unspecified atrial fibrillation: Secondary | ICD-10-CM | POA: Diagnosis not present

## 2015-10-30 LAB — POCT INR: INR: 2.5

## 2015-11-20 ENCOUNTER — Ambulatory Visit (INDEPENDENT_AMBULATORY_CARE_PROVIDER_SITE_OTHER): Payer: PPO | Admitting: *Deleted

## 2015-11-20 DIAGNOSIS — I4891 Unspecified atrial fibrillation: Secondary | ICD-10-CM

## 2015-11-20 DIAGNOSIS — Z7901 Long term (current) use of anticoagulants: Secondary | ICD-10-CM | POA: Diagnosis not present

## 2015-11-20 LAB — POCT INR: INR: 2.1

## 2015-11-21 DIAGNOSIS — R011 Cardiac murmur, unspecified: Secondary | ICD-10-CM | POA: Diagnosis not present

## 2015-11-21 DIAGNOSIS — I2510 Atherosclerotic heart disease of native coronary artery without angina pectoris: Secondary | ICD-10-CM | POA: Diagnosis not present

## 2015-11-21 DIAGNOSIS — R0689 Other abnormalities of breathing: Secondary | ICD-10-CM | POA: Diagnosis not present

## 2015-11-21 DIAGNOSIS — G4733 Obstructive sleep apnea (adult) (pediatric): Secondary | ICD-10-CM | POA: Diagnosis not present

## 2015-12-04 DIAGNOSIS — R0689 Other abnormalities of breathing: Secondary | ICD-10-CM | POA: Diagnosis not present

## 2015-12-04 DIAGNOSIS — I2510 Atherosclerotic heart disease of native coronary artery without angina pectoris: Secondary | ICD-10-CM | POA: Diagnosis not present

## 2015-12-04 DIAGNOSIS — R011 Cardiac murmur, unspecified: Secondary | ICD-10-CM | POA: Diagnosis not present

## 2015-12-04 DIAGNOSIS — G4733 Obstructive sleep apnea (adult) (pediatric): Secondary | ICD-10-CM | POA: Diagnosis not present

## 2015-12-21 DIAGNOSIS — R0689 Other abnormalities of breathing: Secondary | ICD-10-CM | POA: Diagnosis not present

## 2015-12-21 DIAGNOSIS — R011 Cardiac murmur, unspecified: Secondary | ICD-10-CM | POA: Diagnosis not present

## 2015-12-21 DIAGNOSIS — I2510 Atherosclerotic heart disease of native coronary artery without angina pectoris: Secondary | ICD-10-CM | POA: Diagnosis not present

## 2015-12-21 DIAGNOSIS — G4733 Obstructive sleep apnea (adult) (pediatric): Secondary | ICD-10-CM | POA: Diagnosis not present

## 2015-12-24 ENCOUNTER — Inpatient Hospital Stay: Payer: PPO | Attending: Internal Medicine

## 2015-12-24 DIAGNOSIS — E785 Hyperlipidemia, unspecified: Secondary | ICD-10-CM | POA: Insufficient documentation

## 2015-12-24 DIAGNOSIS — Z85828 Personal history of other malignant neoplasm of skin: Secondary | ICD-10-CM | POA: Insufficient documentation

## 2015-12-24 DIAGNOSIS — N189 Chronic kidney disease, unspecified: Secondary | ICD-10-CM | POA: Insufficient documentation

## 2015-12-24 DIAGNOSIS — G4730 Sleep apnea, unspecified: Secondary | ICD-10-CM | POA: Insufficient documentation

## 2015-12-24 DIAGNOSIS — I2510 Atherosclerotic heart disease of native coronary artery without angina pectoris: Secondary | ICD-10-CM | POA: Insufficient documentation

## 2015-12-24 DIAGNOSIS — I252 Old myocardial infarction: Secondary | ICD-10-CM | POA: Insufficient documentation

## 2015-12-24 DIAGNOSIS — Z801 Family history of malignant neoplasm of trachea, bronchus and lung: Secondary | ICD-10-CM | POA: Insufficient documentation

## 2015-12-24 DIAGNOSIS — Z7982 Long term (current) use of aspirin: Secondary | ICD-10-CM | POA: Insufficient documentation

## 2015-12-24 DIAGNOSIS — K219 Gastro-esophageal reflux disease without esophagitis: Secondary | ICD-10-CM | POA: Insufficient documentation

## 2015-12-24 DIAGNOSIS — I350 Nonrheumatic aortic (valve) stenosis: Secondary | ICD-10-CM | POA: Insufficient documentation

## 2015-12-24 DIAGNOSIS — I4891 Unspecified atrial fibrillation: Secondary | ICD-10-CM | POA: Insufficient documentation

## 2015-12-24 DIAGNOSIS — Z86010 Personal history of colonic polyps: Secondary | ICD-10-CM | POA: Insufficient documentation

## 2015-12-24 DIAGNOSIS — E119 Type 2 diabetes mellitus without complications: Secondary | ICD-10-CM | POA: Insufficient documentation

## 2015-12-24 DIAGNOSIS — Z8052 Family history of malignant neoplasm of bladder: Secondary | ICD-10-CM | POA: Insufficient documentation

## 2015-12-24 DIAGNOSIS — E669 Obesity, unspecified: Secondary | ICD-10-CM | POA: Insufficient documentation

## 2015-12-24 DIAGNOSIS — Z8711 Personal history of peptic ulcer disease: Secondary | ICD-10-CM | POA: Insufficient documentation

## 2015-12-24 DIAGNOSIS — D509 Iron deficiency anemia, unspecified: Secondary | ICD-10-CM | POA: Insufficient documentation

## 2015-12-24 DIAGNOSIS — Z79899 Other long term (current) drug therapy: Secondary | ICD-10-CM | POA: Insufficient documentation

## 2015-12-24 DIAGNOSIS — I480 Paroxysmal atrial fibrillation: Secondary | ICD-10-CM | POA: Insufficient documentation

## 2015-12-24 DIAGNOSIS — I1310 Hypertensive heart and chronic kidney disease without heart failure, with stage 1 through stage 4 chronic kidney disease, or unspecified chronic kidney disease: Secondary | ICD-10-CM | POA: Insufficient documentation

## 2015-12-24 DIAGNOSIS — Z8051 Family history of malignant neoplasm of kidney: Secondary | ICD-10-CM | POA: Insufficient documentation

## 2015-12-24 DIAGNOSIS — Z7901 Long term (current) use of anticoagulants: Secondary | ICD-10-CM | POA: Insufficient documentation

## 2015-12-24 DIAGNOSIS — Z794 Long term (current) use of insulin: Secondary | ICD-10-CM | POA: Insufficient documentation

## 2015-12-24 DIAGNOSIS — R0602 Shortness of breath: Secondary | ICD-10-CM | POA: Insufficient documentation

## 2015-12-24 DIAGNOSIS — Z8673 Personal history of transient ischemic attack (TIA), and cerebral infarction without residual deficits: Secondary | ICD-10-CM | POA: Insufficient documentation

## 2015-12-24 DIAGNOSIS — I7789 Other specified disorders of arteries and arterioles: Secondary | ICD-10-CM | POA: Insufficient documentation

## 2015-12-24 DIAGNOSIS — Z7984 Long term (current) use of oral hypoglycemic drugs: Secondary | ICD-10-CM | POA: Insufficient documentation

## 2015-12-25 ENCOUNTER — Inpatient Hospital Stay: Payer: PPO | Admitting: *Deleted

## 2015-12-25 DIAGNOSIS — Z7901 Long term (current) use of anticoagulants: Secondary | ICD-10-CM | POA: Diagnosis not present

## 2015-12-25 DIAGNOSIS — I252 Old myocardial infarction: Secondary | ICD-10-CM | POA: Diagnosis not present

## 2015-12-25 DIAGNOSIS — G4730 Sleep apnea, unspecified: Secondary | ICD-10-CM | POA: Diagnosis not present

## 2015-12-25 DIAGNOSIS — D509 Iron deficiency anemia, unspecified: Secondary | ICD-10-CM | POA: Diagnosis not present

## 2015-12-25 DIAGNOSIS — E119 Type 2 diabetes mellitus without complications: Secondary | ICD-10-CM | POA: Diagnosis not present

## 2015-12-25 DIAGNOSIS — Z86010 Personal history of colonic polyps: Secondary | ICD-10-CM | POA: Diagnosis not present

## 2015-12-25 DIAGNOSIS — N189 Chronic kidney disease, unspecified: Secondary | ICD-10-CM | POA: Diagnosis not present

## 2015-12-25 DIAGNOSIS — Z7984 Long term (current) use of oral hypoglycemic drugs: Secondary | ICD-10-CM | POA: Diagnosis not present

## 2015-12-25 DIAGNOSIS — I7789 Other specified disorders of arteries and arterioles: Secondary | ICD-10-CM | POA: Diagnosis not present

## 2015-12-25 DIAGNOSIS — Z79899 Other long term (current) drug therapy: Secondary | ICD-10-CM | POA: Diagnosis not present

## 2015-12-25 DIAGNOSIS — I2510 Atherosclerotic heart disease of native coronary artery without angina pectoris: Secondary | ICD-10-CM | POA: Diagnosis not present

## 2015-12-25 DIAGNOSIS — R0602 Shortness of breath: Secondary | ICD-10-CM | POA: Diagnosis not present

## 2015-12-25 DIAGNOSIS — I1310 Hypertensive heart and chronic kidney disease without heart failure, with stage 1 through stage 4 chronic kidney disease, or unspecified chronic kidney disease: Secondary | ICD-10-CM | POA: Diagnosis not present

## 2015-12-25 DIAGNOSIS — I4891 Unspecified atrial fibrillation: Secondary | ICD-10-CM | POA: Diagnosis not present

## 2015-12-25 DIAGNOSIS — Z8673 Personal history of transient ischemic attack (TIA), and cerebral infarction without residual deficits: Secondary | ICD-10-CM | POA: Diagnosis not present

## 2015-12-25 DIAGNOSIS — I350 Nonrheumatic aortic (valve) stenosis: Secondary | ICD-10-CM | POA: Diagnosis not present

## 2015-12-25 DIAGNOSIS — K219 Gastro-esophageal reflux disease without esophagitis: Secondary | ICD-10-CM | POA: Diagnosis not present

## 2015-12-25 DIAGNOSIS — Z8711 Personal history of peptic ulcer disease: Secondary | ICD-10-CM | POA: Diagnosis not present

## 2015-12-25 DIAGNOSIS — Z85828 Personal history of other malignant neoplasm of skin: Secondary | ICD-10-CM | POA: Diagnosis not present

## 2015-12-25 DIAGNOSIS — E669 Obesity, unspecified: Secondary | ICD-10-CM | POA: Diagnosis not present

## 2015-12-25 DIAGNOSIS — Z794 Long term (current) use of insulin: Secondary | ICD-10-CM | POA: Diagnosis not present

## 2015-12-25 DIAGNOSIS — I480 Paroxysmal atrial fibrillation: Secondary | ICD-10-CM | POA: Diagnosis not present

## 2015-12-25 DIAGNOSIS — E785 Hyperlipidemia, unspecified: Secondary | ICD-10-CM | POA: Diagnosis not present

## 2015-12-25 DIAGNOSIS — Z7982 Long term (current) use of aspirin: Secondary | ICD-10-CM | POA: Diagnosis not present

## 2015-12-25 LAB — IRON AND TIBC
Iron: 56 ug/dL (ref 45–182)
Saturation Ratios: 18 % (ref 17.9–39.5)
TIBC: 321 ug/dL (ref 250–450)
UIBC: 265 ug/dL

## 2015-12-25 LAB — CBC WITH DIFFERENTIAL/PLATELET
Basophils Absolute: 0 10*3/uL (ref 0–0.1)
Basophils Relative: 1 %
Eosinophils Absolute: 0.2 10*3/uL (ref 0–0.7)
Eosinophils Relative: 5 %
HCT: 34.9 % — ABNORMAL LOW (ref 40.0–52.0)
Hemoglobin: 12 g/dL — ABNORMAL LOW (ref 13.0–18.0)
Lymphocytes Relative: 24 %
Lymphs Abs: 0.8 10*3/uL — ABNORMAL LOW (ref 1.0–3.6)
MCH: 31.9 pg (ref 26.0–34.0)
MCHC: 34.5 g/dL (ref 32.0–36.0)
MCV: 92.7 fL (ref 80.0–100.0)
Monocytes Absolute: 0.4 10*3/uL (ref 0.2–1.0)
Monocytes Relative: 11 %
Neutro Abs: 2 10*3/uL (ref 1.4–6.5)
Neutrophils Relative %: 59 %
Platelets: 168 10*3/uL (ref 150–440)
RBC: 3.77 MIL/uL — ABNORMAL LOW (ref 4.40–5.90)
RDW: 13 % (ref 11.5–14.5)
WBC: 3.5 10*3/uL — ABNORMAL LOW (ref 3.8–10.6)

## 2015-12-25 LAB — FERRITIN: Ferritin: 55 ng/mL (ref 24–336)

## 2015-12-26 ENCOUNTER — Ambulatory Visit: Payer: Self-pay

## 2015-12-26 ENCOUNTER — Ambulatory Visit: Payer: Self-pay | Admitting: Internal Medicine

## 2015-12-27 ENCOUNTER — Inpatient Hospital Stay (HOSPITAL_BASED_OUTPATIENT_CLINIC_OR_DEPARTMENT_OTHER): Payer: PPO | Admitting: Internal Medicine

## 2015-12-27 ENCOUNTER — Inpatient Hospital Stay: Payer: PPO

## 2015-12-27 VITALS — BP 161/78 | HR 47 | Temp 97.8°F | Resp 18 | Wt 189.1 lb

## 2015-12-27 DIAGNOSIS — N189 Chronic kidney disease, unspecified: Secondary | ICD-10-CM

## 2015-12-27 DIAGNOSIS — Z8051 Family history of malignant neoplasm of kidney: Secondary | ICD-10-CM

## 2015-12-27 DIAGNOSIS — Z79899 Other long term (current) drug therapy: Secondary | ICD-10-CM

## 2015-12-27 DIAGNOSIS — I350 Nonrheumatic aortic (valve) stenosis: Secondary | ICD-10-CM

## 2015-12-27 DIAGNOSIS — I4891 Unspecified atrial fibrillation: Secondary | ICD-10-CM | POA: Diagnosis not present

## 2015-12-27 DIAGNOSIS — I1310 Hypertensive heart and chronic kidney disease without heart failure, with stage 1 through stage 4 chronic kidney disease, or unspecified chronic kidney disease: Secondary | ICD-10-CM

## 2015-12-27 DIAGNOSIS — G4730 Sleep apnea, unspecified: Secondary | ICD-10-CM

## 2015-12-27 DIAGNOSIS — Z86010 Personal history of colonic polyps: Secondary | ICD-10-CM

## 2015-12-27 DIAGNOSIS — E785 Hyperlipidemia, unspecified: Secondary | ICD-10-CM

## 2015-12-27 DIAGNOSIS — E119 Type 2 diabetes mellitus without complications: Secondary | ICD-10-CM

## 2015-12-27 DIAGNOSIS — Z8711 Personal history of peptic ulcer disease: Secondary | ICD-10-CM

## 2015-12-27 DIAGNOSIS — I2510 Atherosclerotic heart disease of native coronary artery without angina pectoris: Secondary | ICD-10-CM

## 2015-12-27 DIAGNOSIS — Z7901 Long term (current) use of anticoagulants: Secondary | ICD-10-CM

## 2015-12-27 DIAGNOSIS — Z8052 Family history of malignant neoplasm of bladder: Secondary | ICD-10-CM

## 2015-12-27 DIAGNOSIS — I7789 Other specified disorders of arteries and arterioles: Secondary | ICD-10-CM

## 2015-12-27 DIAGNOSIS — K219 Gastro-esophageal reflux disease without esophagitis: Secondary | ICD-10-CM

## 2015-12-27 DIAGNOSIS — D509 Iron deficiency anemia, unspecified: Secondary | ICD-10-CM

## 2015-12-27 DIAGNOSIS — E669 Obesity, unspecified: Secondary | ICD-10-CM

## 2015-12-27 DIAGNOSIS — Z8673 Personal history of transient ischemic attack (TIA), and cerebral infarction without residual deficits: Secondary | ICD-10-CM

## 2015-12-27 DIAGNOSIS — Z7982 Long term (current) use of aspirin: Secondary | ICD-10-CM

## 2015-12-27 DIAGNOSIS — Z801 Family history of malignant neoplasm of trachea, bronchus and lung: Secondary | ICD-10-CM

## 2015-12-27 DIAGNOSIS — Z794 Long term (current) use of insulin: Secondary | ICD-10-CM

## 2015-12-27 DIAGNOSIS — Z7984 Long term (current) use of oral hypoglycemic drugs: Secondary | ICD-10-CM

## 2015-12-27 DIAGNOSIS — I480 Paroxysmal atrial fibrillation: Secondary | ICD-10-CM

## 2015-12-27 DIAGNOSIS — R0602 Shortness of breath: Secondary | ICD-10-CM

## 2015-12-27 DIAGNOSIS — Z85828 Personal history of other malignant neoplasm of skin: Secondary | ICD-10-CM

## 2015-12-27 DIAGNOSIS — D508 Other iron deficiency anemias: Secondary | ICD-10-CM

## 2015-12-27 DIAGNOSIS — I252 Old myocardial infarction: Secondary | ICD-10-CM

## 2015-12-27 NOTE — Progress Notes (Signed)
[place] OFFICE PROGRESS NOTE  Patient Care Team: Rusty Aus, MD as PCP - General (Internal Medicine)   SUMMARY OF ONCOLOGIC HISTORY:  # MARCH 2017- April 2017- IRON DEFICIENCY ANEMIA ? Etiology s/p IV iron [EGD- ?June 2017- Bleeding gastric "polyp"/ colo- Dr.Gessner]  # hx of A.fib- on coumadin-HOLD April 2017/ CAD [Dr.[name]]; Hx of gastric ulcer [Dr.Gessner];   TIA/ CEA [s/p April 2017]  History of present illness:   [name] is here for follow-up of his severe iron deficiency anemia- patient had EGD noted to have a bleeding "polyp". He has been restarted back on Coumadin.   Denies any fatigue. Appetite is good. Denies any nausea vomiting. No abdominal pain. He is not on iron pills.  He continues to deny any blood in stools black stools. Appetite is good. No chest pain or shortness of breath or cough. His energy levels are improving.    REVIEW OF SYSTEMS:  A complete 10 point review of system is done which is negative except mentioned above/history of present illness.   PAST MEDICAL HISTORY :  Past Medical History  Diagnosis Date  . Hypertensive heart disease   . Hyperlipidemia   . Diabetes mellitus type II, controlled ([place])     a. Variable CBG 02/2012 - several meds adjusted.  . Mild aortic stenosis     a. 03/2011 Echo: EF 55-60%, Mild AS. b. Mild by cath 02/2012; c. 01/2014 Echo: EF 65-70%, Gr 1 DD, mild AS, mildly dil LA.  . Obesity   . GERD (gastroesophageal reflux disease)   . PAF (paroxysmal atrial fibrillation) ([place])     a. Newly dx 02/2012 & initiated on Coumadin (spont converted to NSR).  Marland Kitchen CAD (coronary artery disease)     a. 01/2010 CABG x 4: LIMA->LAD, VG->D1, VG->OM1, VG->PDA. b. NSTEMI 02/2012 in setting of AF-RVR with cath s/p BMS to RCA (plan for 1 month, possibly up to 3 months of Plavix)  . Carotid disease, bilateral ([place])     a. 03/2011 U/S: 40-59% bilat Carotid dzs;  b. 04/2015 Carotid U/S: 40-59% bilat ICA  stenosis; c. 05/2015 CTA Neck: [id number] RICA, [id number] LICA, mod-marked R vertebral stenosis, mod L vertebral stenosis.  . Myocardial infarction ([place]) 2014  . Hypertension   . Shortness of breath dyspnea   . Sleep apnea     CPAP with oxygen  . Chronic kidney disease   . Basal cell carcinoma of skin   . Anemia   . Macular edema bil    PAST SURGICAL HISTORY :   Past Surgical History  Procedure Laterality Date  . Cyst l kidney  [place]  . Angioplasty    . Cardiac catheterization    . Left heart catheterization with coronary angiogram N/A 03/09/2012    Procedure: LEFT HEART CATHETERIZATION WITH CORONARY ANGIOGRAM;  Surgeon: Wellington Hampshire, MD;  Location: [facility] CATH LAB;  Service: Cardiovascular;  Laterality: N/A;  . Percutaneous coronary stent intervention (pci-s)  03/09/2012    Procedure: PERCUTANEOUS CORONARY STENT INTERVENTION (PCI-S);  Surgeon: Wellington Hampshire, MD;  Location: [facility] CATH LAB;  Service: Cardiovascular;;  . Coronary artery bypass graft  2011    [facility], [place], [place],   . Coronary angioplasty      Dr. Fletcher Anon  . Cataract extraction Bilateral     Cataract Extraction with IOL  . Inguinal hernia repair Right     Right Inguinal Hernia Repair    FAMILY HISTORY :  Family History  Problem Relation Age of Onset  . COPD Mother     alive 36  . Heart attack Father     September 04, 2013 deceased  . Bladder Cancer Mother   . Lung cancer Maternal Aunt   . Liver cancer Maternal Aunt   . Kidney disease Paternal Uncle   . Cancer Other     all paternal aunts and uncles    SOCIAL HISTORY:   Social History  Substance Use Topics  . Smoking status: Never Smoker   . Smokeless tobacco: Never Used     Comment: tobacco use - no  . Alcohol Use: Yes     Comment: rare drink    ALLERGIES:  has No Known Allergies.  MEDICATIONS:  Current Outpatient Prescriptions  Medication Sig Dispense Refill  . acetaminophen (TYLENOL) 325 MG tablet Take 650 mg by mouth every 6 (six) hours as needed.  For pain    . amiodarone (PACERONE) 200 MG tablet Take 0.5 tablets (100 mg total) by mouth daily. (Patient taking differently: Take 100 mg by mouth at bedtime. ) 45 tablet 3  . amLODipine (NORVASC) 5 MG tablet Take one tablet by mouth one time daily 90 tablet 3  . aspirin 81 MG EC tablet Take 81 mg by mouth daily.      Marland Kitchen atorvastatin (LIPITOR) 20 MG tablet Take 1 tablet (20 mg total) by mouth at bedtime. 90 tablet 3  . furosemide (LASIX) 20 MG tablet Take 1 tablet (20 mg total) by mouth as needed. 30 tablet 6  . glimepiride (AMARYL) 4 MG tablet Take 4 mg by mouth 2 (two) times daily.      . insulin glargine (LANTUS) 100 UNIT/ML injection Inject 20 Units into the skin daily.     . insulin lispro (HUMALOG) 100 UNIT/ML injection Inject 8 Units into the skin 2 (two) times daily with a meal. (Patient taking differently: Inject into the skin 3 (three) times daily. Per sliding scale.)    . metFORMIN (GLUCOPHAGE) 500 MG tablet Take 500 mg by mouth 2 (two) times daily with a meal.    . nitroGLYCERIN (NITROSTAT) 0.4 MG SL tablet Place 1 tablet (0.4 mg total) under the tongue every 5 (five) minutes as needed (up to 3 doses). 25 tablet 1  . OXYGEN Inhale 2.5 L/min into the lungs at bedtime. With CPAP    . pantoprazole (PROTONIX) 40 MG tablet Take 1 tablet (40 mg total) by mouth daily. (Patient taking differently: Take 40 mg by mouth 2 (two) times daily. ) 90 tablet 3  . ramipril (ALTACE) 10 MG capsule Take 1 capsule (10 mg total) by mouth daily. 90 capsule 5  . warfarin (COUMADIN) 5 MG tablet Take as directed by anticoagulation clinic 90 tablet 1  . IRON-VITAMIN C PO Take 1 tablet by mouth daily. Reported on 08/27/2015    . Omega-3 Fatty Acids (FISH OIL) 1200 MG CAPS Take 1 capsule by mouth daily. Reported on 08/27/2015     No current facility-administered medications for this visit.    PHYSICAL EXAMINATION:   BP 158/71 mmHg  Pulse 51  Temp(Src) 96.1 F (35.6 C)  Resp 18  Wt 192 lb 0.3 oz (87.1  kg)  Filed Weights   08/27/15 1015  Weight: 192 lb 0.3 oz (87.1 kg)    GENERAL: Well-nourished well-developed; Alert, no distress and comfortable. Alone.  EYES: positive for pallor OROPHARYNX: no thrush or ulceration; good dentition  NECK: supple, no masses felt LYMPH:  no palpable lymphadenopathy in  the cervical, axillary or inguinal regions LUNGS: clear to auscultation and  No wheeze or crackles HEART/CVS: regular rate & rhythm and no murmurs; No lower extremity edema ABDOMEN:abdomen soft, non-tender and normal bowel sounds Musculoskeletal:no cyanosis of digits and no clubbing  PSYCH: alert & oriented x 3 with fluent speech NEURO: no focal motor/sensory deficits SKIN:  no rashes or significant lesions  LABORATORY DATA:  I have reviewed the data as listed    Component Value Date/Time   NA 139 07/25/2015 1138   NA 139 01/22/2013 0348   K 4.1 07/25/2015 1138   K 4.2 01/22/2013 0348   CL 103 07/25/2015 1138   CL 106 01/22/2013 0348   CO2 27 07/25/2015 1138   CO2 28 01/22/2013 0348   GLUCOSE 216* 07/25/2015 1138   GLUCOSE 158* 01/22/2013 0348   BUN 14 07/25/2015 1138   BUN 16 01/22/2013 0348   CREATININE 1.03 07/25/2015 1138   CREATININE 1.02 01/22/2013 0348   CALCIUM 9.1 07/25/2015 1138   CALCIUM 8.6 01/22/2013 0348   PROT 7.2 06/25/2015 1045   PROT 7.5 01/21/2013 0209   ALBUMIN 4.1 06/25/2015 1045   ALBUMIN 4.3 01/21/2013 0209   AST 30 06/25/2015 1045   AST 28 01/21/2013 0209   ALT 33 06/25/2015 1045   ALT 36 01/21/2013 0209   ALKPHOS 104 06/25/2015 1045   ALKPHOS 143* 01/21/2013 0209   BILITOT 0.4 06/25/2015 1045   BILITOT 0.4 01/21/2013 0209   GFRNONAA >60 07/25/2015 1138   GFRNONAA >60 01/22/2013 0348   GFRAA >60 07/25/2015 1138   GFRAA >60 01/22/2013 0348    No results found for: SPEP, UPEP  Lab Results  Component Value Date   WBC 4.5 08/22/2015   NEUTROABS 2.5 08/22/2015   HGB 8.0 Repeated and verified X2.* 08/22/2015   HCT 26.2 Repeated and  verified X2.* 08/22/2015   MCV 67.4 Repeated and verified X2.* 08/22/2015   PLT 264.0 08/22/2015      Chemistry      Component Value Date/Time   NA 139 07/25/2015 1138   NA 139 01/22/2013 0348   K 4.1 07/25/2015 1138   K 4.2 01/22/2013 0348   CL 103 07/25/2015 1138   CL 106 01/22/2013 0348   CO2 27 07/25/2015 1138   CO2 28 01/22/2013 0348   BUN 14 07/25/2015 1138   BUN 16 01/22/2013 0348   CREATININE 1.03 07/25/2015 1138   CREATININE 1.02 01/22/2013 0348      Component Value Date/Time   CALCIUM 9.1 07/25/2015 1138   CALCIUM 8.6 01/22/2013 0348   ALKPHOS 104 06/25/2015 1045   ALKPHOS 143* 01/21/2013 0209   AST 30 06/25/2015 1045   AST 28 01/21/2013 0209   ALT 33 06/25/2015 1045   ALT 36 01/21/2013 0209   BILITOT 0.4 06/25/2015 1045   BILITOT 0.4 01/21/2013 0209        ASSESSMENT & PLAN:   Other iron deficiency anemias # Severe anemia- iron deficient- likely from stomach blood loss. Status post IV iron Feraheme- hemoglobin today is 12. Pt inclines toward PO iron. Will hold off IV iron.   # History of A. Fib- Coumadin as per cards.    # Recommend follow-up in 3 months with CBC and studies and ferritin possible Feraheme if needed. He'll have labs done a week prior.

## 2015-12-27 NOTE — Assessment & Plan Note (Signed)
#   Severe anemia- iron deficient- likely from stomach blood loss. Status post IV iron Feraheme- hemoglobin today is 12. Pt inclines toward PO iron. Will hold off IV iron.   # History of A. Fib- Coumadin as per cards.    # Recommend follow-up in 3 months with CBC and studies and ferritin possible Feraheme if needed. He'll have labs done a week prior.

## 2016-01-01 ENCOUNTER — Ambulatory Visit (INDEPENDENT_AMBULATORY_CARE_PROVIDER_SITE_OTHER): Payer: PPO

## 2016-01-01 DIAGNOSIS — Z7901 Long term (current) use of anticoagulants: Secondary | ICD-10-CM | POA: Diagnosis not present

## 2016-01-01 DIAGNOSIS — I4891 Unspecified atrial fibrillation: Secondary | ICD-10-CM | POA: Diagnosis not present

## 2016-01-01 LAB — POCT INR: INR: 2.5

## 2016-01-02 DIAGNOSIS — E103513 Type 1 diabetes mellitus with proliferative diabetic retinopathy with macular edema, bilateral: Secondary | ICD-10-CM | POA: Diagnosis not present

## 2016-01-21 DIAGNOSIS — R0689 Other abnormalities of breathing: Secondary | ICD-10-CM | POA: Diagnosis not present

## 2016-01-21 DIAGNOSIS — R011 Cardiac murmur, unspecified: Secondary | ICD-10-CM | POA: Diagnosis not present

## 2016-01-21 DIAGNOSIS — I2510 Atherosclerotic heart disease of native coronary artery without angina pectoris: Secondary | ICD-10-CM | POA: Diagnosis not present

## 2016-01-21 DIAGNOSIS — G4733 Obstructive sleep apnea (adult) (pediatric): Secondary | ICD-10-CM | POA: Diagnosis not present

## 2016-02-11 DIAGNOSIS — I10 Essential (primary) hypertension: Secondary | ICD-10-CM | POA: Insufficient documentation

## 2016-02-12 DIAGNOSIS — E538 Deficiency of other specified B group vitamins: Secondary | ICD-10-CM | POA: Diagnosis not present

## 2016-02-12 DIAGNOSIS — E113293 Type 2 diabetes mellitus with mild nonproliferative diabetic retinopathy without macular edema, bilateral: Secondary | ICD-10-CM | POA: Diagnosis not present

## 2016-02-12 DIAGNOSIS — E1165 Type 2 diabetes mellitus with hyperglycemia: Secondary | ICD-10-CM | POA: Diagnosis not present

## 2016-02-12 DIAGNOSIS — Z794 Long term (current) use of insulin: Secondary | ICD-10-CM | POA: Diagnosis not present

## 2016-02-19 ENCOUNTER — Ambulatory Visit (INDEPENDENT_AMBULATORY_CARE_PROVIDER_SITE_OTHER): Payer: PPO

## 2016-02-19 DIAGNOSIS — Z7901 Long term (current) use of anticoagulants: Secondary | ICD-10-CM | POA: Diagnosis not present

## 2016-02-19 DIAGNOSIS — I4891 Unspecified atrial fibrillation: Secondary | ICD-10-CM

## 2016-02-19 LAB — POCT INR: INR: 2.7

## 2016-02-20 DIAGNOSIS — N529 Male erectile dysfunction, unspecified: Secondary | ICD-10-CM | POA: Diagnosis not present

## 2016-02-20 DIAGNOSIS — Z0000 Encounter for general adult medical examination without abnormal findings: Secondary | ICD-10-CM | POA: Diagnosis not present

## 2016-02-20 DIAGNOSIS — E538 Deficiency of other specified B group vitamins: Secondary | ICD-10-CM | POA: Diagnosis not present

## 2016-02-20 DIAGNOSIS — Z23 Encounter for immunization: Secondary | ICD-10-CM | POA: Diagnosis not present

## 2016-02-21 DIAGNOSIS — G4733 Obstructive sleep apnea (adult) (pediatric): Secondary | ICD-10-CM | POA: Diagnosis not present

## 2016-02-21 DIAGNOSIS — R011 Cardiac murmur, unspecified: Secondary | ICD-10-CM | POA: Diagnosis not present

## 2016-02-21 DIAGNOSIS — I2510 Atherosclerotic heart disease of native coronary artery without angina pectoris: Secondary | ICD-10-CM | POA: Diagnosis not present

## 2016-02-21 DIAGNOSIS — R0689 Other abnormalities of breathing: Secondary | ICD-10-CM | POA: Diagnosis not present

## 2016-02-26 ENCOUNTER — Encounter: Payer: Self-pay | Admitting: Urology

## 2016-02-26 ENCOUNTER — Ambulatory Visit (INDEPENDENT_AMBULATORY_CARE_PROVIDER_SITE_OTHER): Payer: PPO | Admitting: Urology

## 2016-02-26 VITALS — BP 150/57 | HR 50 | Ht 68.0 in | Wt 194.2 lb

## 2016-02-26 DIAGNOSIS — N5201 Erectile dysfunction due to arterial insufficiency: Secondary | ICD-10-CM | POA: Diagnosis not present

## 2016-02-26 NOTE — Progress Notes (Signed)
02/26/2016 3:15 PM   Seth Hardy [birth year] [id number]  Referring provider: Rusty Aus, MD [facility] [place], [place] [address]  Chief Complaint  Patient presents with  . New Patient (Initial Visit)    Erectile Dysfunction    HPI: The patient is a 70 year old gentleman with multiple comorbidities including DM, myocardial infarction, history of carotid artery disease, coronary disease, history of CABG who presents today to discuss his erectile dysfunction. Of note the patient is prescribed nitrates for chest pain.  He has tried This did not work for him. He is unable to obtain erections this time. This is been going on for a number of years. Per the patient he has no cardiac restrictions at this time. He denies urinary issues including no hematuria, frequency, urgency, or weak stream. The patient is requesting a vacuum erection device at this time.   PMH: Past Medical History:  Diagnosis Date  . Anemia 08/2015   received 2 units rbc one week ago, 3 IV iron infusion -last 1 week.  . Basal cell carcinoma of skin 2012   removed several spots from arms and back  . CAD (coronary artery disease)    a. 01/2010 CABG x 4: LIMA->LAD, VG->D1, VG->OM1, VG->PDA. b. NSTEMI 02/2012 in setting of AF-RVR with cath s/p BMS to RCA (plan for 1 month, possibly up to 3 months of Plavix)  . Carotid disease, bilateral ([place])    a. 03/2011 U/S: 40-59% bilat Carotid dzs;  b. 04/2015 Carotid U/S: 40-59% bilat ICA stenosis; c. 05/2015 CTA Neck: [id number] RICA, [id number] LICA, mod-marked R vertebral stenosis, mod L vertebral stenosis.  . Chronic kidney disease    small obtusion per pcp. ultrasound done on 08/29/2015  . Diabetes mellitus type II, controlled ([place])    a. Variable CBG 02/2012 - several meds adjusted.  Marland Kitchen Dysrhythmia    intermittent Atrial Fibrillation  . GERD (gastroesophageal reflux disease)   . Hyperlipidemia   . Hypertension   . Hypertensive heart disease     . Macular edema bil   lazer work done previously  . Mild aortic stenosis    a. 03/2011 Echo: EF 55-60%, Mild AS. b. Mild by cath 02/2012; c. 01/2014 Echo: EF 65-70%, Gr 1 DD, mild AS, mildly dil LA.  . Multiple gastric ulcers 2006  . Myocardial infarction 2014   stents (x1) at time  . Obesity   . PAF (paroxysmal atrial fibrillation) ([place])    a. Newly dx 02/2012 & initiated on Coumadin (spont converted to NSR).  . Shortness of breath dyspnea   . Sleep apnea    CPAP settings 3 with oxygen 2.5  . Transfusion history    transfusions -1 month ago -2 units    Surgical History: Past Surgical History:  Procedure Laterality Date  . ANGIOPLASTY    . CARDIAC CATHETERIZATION  2014  . CATARACT EXTRACTION Bilateral 2016   Cataract Extraction with IOL  . COLONOSCOPY WITH PROPOFOL N/A 10/15/2015   Procedure: COLONOSCOPY WITH PROPOFOL;  Surgeon: Gatha Mayer, MD;  Location: [facility] ENDOSCOPY;  Service: Endoscopy;  Laterality: N/A;  . CORONARY ANGIOPLASTY     Dr. Fletcher Anon  . CORONARY ARTERY BYPASS GRAFT  2011   [facility], [place], [place],   . cyst L kidney  [place]  . ENDARTERECTOMY Left 08/30/2015   Procedure: ENDARTERECTOMY CAROTID;  Surgeon: Katha Cabal, MD;  Location: [facility] ORS;  Service: Vascular;  Laterality: Left;  . ESOPHAGOGASTRODUODENOSCOPY (EGD)  WITH PROPOFOL N/A 10/15/2015   Procedure: ESOPHAGOGASTRODUODENOSCOPY (EGD) WITH PROPOFOL;  Surgeon: Gatha Mayer, MD;  Location: [facility] ENDOSCOPY;  Service: Endoscopy;  Laterality: N/A;  . INGUINAL HERNIA REPAIR Right    Right Inguinal Hernia Repair  . LEFT HEART CATHETERIZATION WITH CORONARY ANGIOGRAM N/A 03/09/2012   Procedure: LEFT HEART CATHETERIZATION WITH CORONARY ANGIOGRAM;  Surgeon: Wellington Hampshire, MD;  Location: [facility] CATH LAB;  Service: Cardiovascular;  Laterality: N/A;  . PERCUTANEOUS CORONARY STENT INTERVENTION (PCI-S)  03/09/2012   Procedure: PERCUTANEOUS CORONARY STENT INTERVENTION (PCI-S);  Surgeon: Wellington Hampshire, MD;   Location: [facility] CATH LAB;  Service: Cardiovascular;;    Home Medications:    Medication List       Accurate as of 02/26/16  3:15 PM. Always use your most recent med list.          acetaminophen 325 MG tablet Commonly known as:  TYLENOL Take 650 mg by mouth every 6 (six) hours as needed. For pain   amiodarone 200 MG tablet Commonly known as:  PACERONE Take 0.5 tablets (100 mg total) by mouth at bedtime.   amLODipine 5 MG tablet Commonly known as:  NORVASC Take one tablet by mouth one time daily   aspirin 81 MG EC tablet Take 81 mg by mouth daily.   atorvastatin 20 MG tablet Commonly known as:  LIPITOR TAKE ONE TABLET BY MOUTH AT BEDTIME   B-12 1000 MCG Caps Take 1,000 mcg by mouth daily.   Fish Oil 1200 MG Caps Take 1,200 mg by mouth daily. Reported on 08/27/2015   furosemide 20 MG tablet Commonly known as:  LASIX Take 1 tablet (20 mg total) by mouth as needed for fluid or edema.   glimepiride 4 MG tablet Commonly known as:  AMARYL Take 4 mg by mouth 2 (two) times daily.   insulin glargine 100 UNIT/ML injection Commonly known as:  LANTUS Inject 20 Units into the skin daily.   insulin lispro 100 UNIT/ML injection Commonly known as:  HUMALOG Per sliding scale.   metFORMIN 500 MG tablet Commonly known as:  GLUCOPHAGE Take 500 mg by mouth 2 (two) times daily with a meal.   nitroGLYCERIN 0.4 MG SL tablet Commonly known as:  NITROSTAT Place 1 tablet (0.4 mg total) under the tongue every 5 (five) minutes as needed (up to 3 doses).   oxyCODONE-acetaminophen 5-325 MG tablet Commonly known as:  PERCOCET/ROXICET Take 1 tablet by mouth every 6 (six) hours as needed for severe pain.   OXYGEN Inhale 2.5 L/min into the lungs at bedtime. With CPAP   pantoprazole 40 MG tablet Commonly known as:  PROTONIX Take 1 tablet (40 mg total) by mouth daily.   ramipril 10 MG capsule Commonly known as:  ALTACE Take 10 mg by mouth daily.   vitamin C 500 MG tablet Commonly  known as:  ASCORBIC ACID Take 500 mg by mouth daily.       Allergies: No Known Allergies  Family History: Family History  Problem Relation Age of Onset  . COPD Mother     alive 28  . Bladder Cancer Mother   . Heart attack Father     14-Sep-2013 deceased  . Lung cancer Maternal Aunt   . Liver cancer Maternal Aunt   . Kidney disease Paternal Uncle   . Cancer Other     all paternal aunts and uncles  . Prostate cancer Neg Hx   . Kidney cancer Neg Hx     Social History:  reports that  he has never smoked. He has never used smokeless tobacco. He reports that he drinks alcohol. He reports that he does not use drugs.  ROS: UROLOGY Frequent Urination?: Yes Hard to postpone urination?: Yes Burning/pain with urination?: No Get up at night to urinate?: Yes Leakage of urine?: No Urine stream starts and stops?: No Trouble starting stream?: No Do you have to strain to urinate?: No Blood in urine?: No Urinary tract infection?: No Sexually transmitted disease?: No Injury to kidneys or bladder?: No Painful intercourse?: No Weak stream?: No Erection problems?: Yes Penile pain?: No  Gastrointestinal Nausea?: Yes Vomiting?: Yes Indigestion/heartburn?: No Diarrhea?: Yes Constipation?: No  Constitutional Fever: No Night sweats?: No Weight loss?: No Fatigue?: Yes  Skin Skin rash/lesions?: No Itching?: Yes  Eyes Blurred vision?: No Double vision?: No  Ears/Nose/Throat Sore throat?: No Sinus problems?: No  Hematologic/Lymphatic Swollen glands?: No Easy bruising?: Yes  Cardiovascular Leg swelling?: No Chest pain?: No  Respiratory Cough?: No Shortness of breath?: No  Endocrine Excessive thirst?: No  Musculoskeletal Back pain?: No Joint pain?: No  Neurological Headaches?: No Dizziness?: No  Psychologic Depression?: No Anxiety?: No  Physical Exam: BP (!) 150/57 (BP Location: Left Arm, Patient Position: Sitting, Cuff Size: Normal)   Pulse (!) 50   Ht 5'  8" (1.727 m)   Wt 194 lb 3.2 oz (88.1 kg)   BMI 29.53 kg/m   Constitutional:  Alert and oriented, No acute distress. HEENT: [place] AT, moist mucus membranes.  Trachea midline, no masses. Cardiovascular: No clubbing, cyanosis, or edema. Respiratory: Normal respiratory effort, no increased work of breathing. GI: Abdomen is soft, nontender, nondistended, no abdominal masses GU: No CVA tenderness. Normal phallus. Testicles descended equal bilaterally. No masses. Skin: No rashes, bruises or suspicious lesions. Lymph: No cervical or inguinal adenopathy. Neurologic: Grossly intact, no focal deficits, moving all 4 extremities. Psychiatric: Normal mood and affect.  Laboratory Data: Lab Results  Component Value Date   WBC 3.5 (L) 12/25/2015   HGB 12.0 (L) 12/25/2015   HCT 34.9 (L) 12/25/2015   MCV 92.7 12/25/2015   PLT 168 12/25/2015    Lab Results  Component Value Date   CREATININE 0.95 08/31/2015    No results found for: PSA  No results found for: TESTOSTERONE  Lab Results  Component Value Date   HGBA1C (H) 02/15/2010    8.9 (NOTE)                                                                       According to the ADA Clinical Practice Recommendations for 2011, when HbA1c is used as a screening test:   >=6.5%   Diagnostic of Diabetes Mellitus           (if abnormal result  is confirmed)  5.7-6.4%   Increased risk of developing Diabetes Mellitus  References:Diagnosis and Classification of Diabetes Mellitus,Diabetes [id number] 1):S62-S69 and Standards of Medical Care in         Diabetes - 2011,Diabetes Care,2011,34  (Suppl 1):S11-S61.    Urinalysis    Component Value Date/Time   COLORURINE Straw 01/21/2013 0441   COLORURINE YELLOW 02/17/2010 0532   APPEARANCEUR Clear 01/21/2013 0441   LABSPEC 1.005 01/21/2013 0441   PHURINE 6.0 01/21/2013 0441   PHURINE 5.5  02/17/2010 0532   GLUCOSEU >=500 01/21/2013 0441   HGBUR Negative 01/21/2013 0441   HGBUR NEGATIVE 02/17/2010  0532   BILIRUBINUR Negative 01/21/2013 0441   KETONESUR Trace 01/21/2013 0441   KETONESUR NEGATIVE 02/17/2010 0532   PROTEINUR Negative 01/21/2013 0441   PROTEINUR NEGATIVE 02/17/2010 0532   UROBILINOGEN 1.0 02/17/2010 0532   NITRITE Negative 01/21/2013 0441   NITRITE NEGATIVE 02/17/2010 0532   LEUKOCYTESUR Negative 01/21/2013 0441    Assessment & Plan:    1. Erectile dysfunction We will send a prescription the patient to obtain a vacuum erection device. If this does not work we could consider intracavernosal injections however he would need cardiac clearance before any medications are prescribed. He is not a candidate for oral phosphodiesterase inhibitors.  Return in about 3 months (around 05/28/2016).  Nickie Retort, MD  [facility] [address] [place], [place] [address] [phone]

## 2016-03-11 ENCOUNTER — Encounter (INDEPENDENT_AMBULATORY_CARE_PROVIDER_SITE_OTHER): Payer: Self-pay | Admitting: Vascular Surgery

## 2016-03-12 ENCOUNTER — Ambulatory Visit (INDEPENDENT_AMBULATORY_CARE_PROVIDER_SITE_OTHER): Payer: PPO

## 2016-03-12 ENCOUNTER — Encounter (INDEPENDENT_AMBULATORY_CARE_PROVIDER_SITE_OTHER): Payer: Self-pay | Admitting: Vascular Surgery

## 2016-03-12 ENCOUNTER — Other Ambulatory Visit (INDEPENDENT_AMBULATORY_CARE_PROVIDER_SITE_OTHER): Payer: Self-pay | Admitting: Vascular Surgery

## 2016-03-12 ENCOUNTER — Ambulatory Visit (INDEPENDENT_AMBULATORY_CARE_PROVIDER_SITE_OTHER): Payer: PPO | Admitting: Vascular Surgery

## 2016-03-12 VITALS — BP 157/65 | HR 51 | Resp 16 | Ht 68.0 in | Wt 193.0 lb

## 2016-03-12 DIAGNOSIS — E782 Mixed hyperlipidemia: Secondary | ICD-10-CM | POA: Diagnosis not present

## 2016-03-12 DIAGNOSIS — I6523 Occlusion and stenosis of bilateral carotid arteries: Secondary | ICD-10-CM

## 2016-03-12 DIAGNOSIS — I4891 Unspecified atrial fibrillation: Secondary | ICD-10-CM

## 2016-03-12 DIAGNOSIS — I10 Essential (primary) hypertension: Secondary | ICD-10-CM | POA: Diagnosis not present

## 2016-03-12 DIAGNOSIS — I25708 Atherosclerosis of coronary artery bypass graft(s), unspecified, with other forms of angina pectoris: Secondary | ICD-10-CM

## 2016-03-12 DIAGNOSIS — I739 Peripheral vascular disease, unspecified: Principal | ICD-10-CM

## 2016-03-12 DIAGNOSIS — I779 Disorder of arteries and arterioles, unspecified: Secondary | ICD-10-CM

## 2016-03-12 DIAGNOSIS — I672 Cerebral atherosclerosis: Secondary | ICD-10-CM

## 2016-03-12 NOTE — Progress Notes (Signed)
MRN : [id number]  Seth Hardy is a 70 y.o. (Nov 12, 1945) male who presents with chief complaint of  Chief Complaint  Patient presents with  . Carotid    Ultrasound follow up  .  History of Present Illness: The patient is seen for follow up evaluation of carotid stenosis. The carotid stenosis followed by ultrasound.   The patient denies amaurosis fugax. There is no recent history of TIA symptoms or focal motor deficits. There is no prior documented CVA.  The patient is taking enteric-coated aspirin 81 mg daily.  There is no history of migraine headaches. There is no history of seizures.  The patient has a history of coronary artery disease, no recent episodes of angina or shortness of breath. The patient denies PAD or claudication symptoms. There is a history of hyperlipidemia which is being treated with a statin.    Carotid duplex done today shows 0000000 and LICA widely patent s/p left CEA April 2017   Current Outpatient Prescriptions  Medication Sig Dispense Refill  . acetaminophen (TYLENOL) 325 MG tablet Take 650 mg by mouth every 6 (six) hours as needed. For pain    . amiodarone (PACERONE) 200 MG tablet Take 0.5 tablets (100 mg total) by mouth at bedtime. 45 tablet 3  . amLODipine (NORVASC) 5 MG tablet Take one tablet by mouth one time daily (Patient taking differently: Take 5 mg by mouth daily. ) 90 tablet 3  . aspirin 81 MG EC tablet Take 81 mg by mouth daily.      Marland Kitchen atorvastatin (LIPITOR) 20 MG tablet TAKE ONE TABLET BY MOUTH AT BEDTIME 90 tablet 3  . Cyanocobalamin (B-12) 1000 MCG CAPS Take 1,000 mcg by mouth daily.     . furosemide (LASIX) 20 MG tablet Take 1 tablet (20 mg total) by mouth as needed for fluid or edema. 30 tablet 6  . glimepiride (AMARYL) 4 MG tablet Take 4 mg by mouth 2 (two) times daily.      . insulin glargine (LANTUS) 100 UNIT/ML injection Inject 20 Units into the skin daily.     . insulin lispro (HUMALOG) 100 UNIT/ML injection Per sliding  scale. 10 mL 11  . metFORMIN (GLUCOPHAGE) 500 MG tablet Take 500 mg by mouth 2 (two) times daily with a meal.     . nitroGLYCERIN (NITROSTAT) 0.4 MG SL tablet Place 1 tablet (0.4 mg total) under the tongue every 5 (five) minutes as needed (up to 3 doses). (Patient taking differently: Place 0.4 mg under the tongue every 5 (five) minutes as needed for chest pain (up to 3 doses). ) 25 tablet 1  . Omega-3 Fatty Acids (FISH OIL) 1200 MG CAPS Take 1,200 mg by mouth daily. Reported on 08/27/2015    . oxyCODONE-acetaminophen (PERCOCET/ROXICET) 5-325 MG tablet Take 1 tablet by mouth every 6 (six) hours as needed for severe pain. 30 tablet 0  . OXYGEN Inhale 2.5 L/min into the lungs at bedtime. With CPAP    . pantoprazole (PROTONIX) 40 MG tablet Take 1 tablet (40 mg total) by mouth daily. 90 tablet 3  . ramipril (ALTACE) 10 MG capsule Take 10 mg by mouth daily.     . vitamin C (ASCORBIC ACID) 500 MG tablet Take 500 mg by mouth daily.     No current facility-administered medications for this visit.     Past Medical History:  Diagnosis Date  . Anemia 08/2015   received 2 units rbc one week ago, 3 IV iron infusion -last 1 week.  Marland Kitchen  Basal cell carcinoma of skin 2012   removed several spots from arms and back  . CAD (coronary artery disease)    a. 01/2010 CABG x 4: LIMA->LAD, VG->D1, VG->OM1, VG->PDA. b. NSTEMI 02/2012 in setting of AF-RVR with cath s/p BMS to RCA (plan for 1 month, possibly up to 3 months of Plavix)  . Carotid disease, bilateral ([place])    a. 03/2011 U/S: 40-59% bilat Carotid dzs;  b. 04/2015 Carotid U/S: 40-59% bilat ICA stenosis; c. 05/2015 CTA Neck: [id number] RICA, [id number] LICA, mod-marked R vertebral stenosis, mod L vertebral stenosis.  . Chronic kidney disease    small obtusion per pcp. ultrasound done on 08/29/2015  . Diabetes mellitus type II, controlled ([place])    a. Variable CBG 02/2012 - several meds adjusted.  Marland Kitchen Dysrhythmia    intermittent Atrial Fibrillation  . GERD (gastroesophageal reflux  disease)   . Hyperlipidemia   . Hypertension   . Hypertensive heart disease   . Macular edema bil   lazer work done previously  . Mild aortic stenosis    a. 03/2011 Echo: EF 55-60%, Mild AS. b. Mild by cath 02/2012; c. 01/2014 Echo: EF 65-70%, Gr 1 DD, mild AS, mildly dil LA.  . Multiple gastric ulcers 2006  . Myocardial infarction 2014   stents (x1) at time  . Obesity   . PAF (paroxysmal atrial fibrillation) ([place])    a. Newly dx 02/2012 & initiated on Coumadin (spont converted to NSR).  . Shortness of breath dyspnea   . Sleep apnea    CPAP settings 3 with oxygen 2.5  . Transfusion history    transfusions -1 month ago -2 units    Past Surgical History:  Procedure Laterality Date  . ANGIOPLASTY    . CARDIAC CATHETERIZATION  2014  . CATARACT EXTRACTION Bilateral 2016   Cataract Extraction with IOL  . COLONOSCOPY WITH PROPOFOL N/A 10/15/2015   Procedure: COLONOSCOPY WITH PROPOFOL;  Surgeon: Gatha Mayer, MD;  Location: [facility];  Service: Endoscopy;  Laterality: N/A;  . CORONARY ANGIOPLASTY     Dr. Fletcher Anon  . CORONARY ARTERY BYPASS GRAFT  2011   [place], [place], [place],   . cyst L kidney  [place]  . ENDARTERECTOMY Left 08/30/2015   Procedure: ENDARTERECTOMY CAROTID;  Surgeon: Katha Cabal, MD;  Location: [facility] ORS;  Service: Vascular;  Laterality: Left;  . ESOPHAGOGASTRODUODENOSCOPY (EGD) WITH PROPOFOL N/A 10/15/2015   Procedure: ESOPHAGOGASTRODUODENOSCOPY (EGD) WITH PROPOFOL;  Surgeon: Gatha Mayer, MD;  Location: [facility];  Service: Endoscopy;  Laterality: N/A;  . INGUINAL HERNIA REPAIR Right    Right Inguinal Hernia Repair  . LEFT HEART CATHETERIZATION WITH CORONARY ANGIOGRAM N/A 03/09/2012   Procedure: LEFT HEART CATHETERIZATION WITH CORONARY ANGIOGRAM;  Surgeon: Wellington Hampshire, MD;  Location: [facility] CATH LAB;  Service: Cardiovascular;  Laterality: N/A;  . PERCUTANEOUS CORONARY STENT INTERVENTION (PCI-S)  03/09/2012   Procedure: PERCUTANEOUS CORONARY  STENT INTERVENTION (PCI-S);  Surgeon: Wellington Hampshire, MD;  Location: [facility] CATH LAB;  Service: Cardiovascular;;    Social History Social History  Substance Use Topics  . Smoking status: Never Smoker  . Smokeless tobacco: Never Used     Comment: tobacco use - no.no passive smoke in home  . Alcohol use Yes     Comment: rare drink   Family History Family History  Problem Relation Age of Onset  . COPD Mother     alive 19  . Bladder Cancer Mother   . Heart attack  Father     2015 deceased  . Lung cancer Maternal Aunt   . Liver cancer Maternal Aunt   . Kidney disease Paternal Uncle   . Cancer Other     all paternal aunts and uncles  . Prostate cancer Neg Hx   . Kidney cancer Neg Hx     No Known Allergies   REVIEW OF SYSTEMS (Negative unless checked)  Constitutional: [] Weight loss  [] Fever  [] Chills Cardiac: [] Chest pain   [] Chest pressure   [] Palpitations   [] Shortness of breath at rest   [] Shortness of breath with exertion. Vascular:  [] Pain in legs with walking   [] Pain in legs at rest    [] Pain in feet at rest    [] History of DVT   [] Phlebitis   [] Swelling in legs   [] Varicose veins   [] Non-healing ulcers Pulmonary:   [] Uses home oxygen   [] Productive cough   [] Hemoptysis   [] Wheeze  [] COPD   [] Asthma Neurologic:  [] Dizziness  [] Blackouts   [] Seizures   [] History of stroke   [] History of TIA  [] Aphasia   [] Temporary blindness   [] Dysphagia   [] Weakness or numbness in arm   [] Weakness or numbness in leg Musculoskeletal:  [] Arthritis   [] Joint swelling   [] Joint pain   [] Low back pain Hematologic:  [] Easy bruising  [] Easy bleeding   [] Hypercoagulable state   [] Anemic   Gastrointestinal:  [] Blood in stool   [] Vomiting blood  [] Gastroesophageal reflux/heartburn   [] Difficulty swallowing. Genitourinary:  [] Chronic kidney disease   [] Difficult urination  [] Frequent urination  [] Burning with urination   [] Blood in urine Skin:  [] Rashes   [] Ulcers   Psychological:  [] History of anxiety    []  History of major depression.    Physical Examination  Vitals:   03/12/16 1404 03/12/16 1405  BP: (!) 149/64 (!) 157/65  Pulse: (!) 51   Resp: 16   Weight: 193 lb (87.5 kg)   Height: 5\' 8"  (1.727 m)    Body mass index is 29.35 kg/m. Gen:  WD/WN, NAD Head: [place]/AT, No temporalis wasting. Ear/Nose/Throat: Hearing grossly intact, nares w/o erythema or drainage, trachea midline Eyes: PERR, EOM appear normal. Sclera non-icteric Neck: Supple, no nuchal rigidity.  No JVD.  Pulmonary:  Good air movement, equal and clear to auscultation bilaterally.  Cardiac: RRR, normal S1, S2, no Murmurs, rubs or gallops. Vascular:   Well healed left CEA incisional scar. +bilateral bruits Vessel Right Left  Radial Palpable Palpable  Ulnar Palpable Palpable  Brachial Palpable Palpable  Carotid Palpable Palpable  Aorta Not palpable N/A  Femoral Palpable Palpable  Popliteal Palpable Palpable  PT Palpable Palpable  DP Palpable Palpable   Gastrointestinal: soft, non-rigid/non-distended. No guarding.  Musculoskeletal: M/S 5/5 throughout.  No deformity or atrophy. Neurologic: CN 2-12 intact. Pain and light touch intact in extremities.  Speech is fluent. Motor exam as listed above. Psychiatric: Judgment intact, Mood & affect appropriate for pt's clinical situation. Dermatologic: No rashes or ulcers noted.  No signs consistent with cellulitis. Lymphatic:  No cervical lymphadenopathy  CBC Lab Results  Component Value Date   WBC 3.5 (L) 12/25/2015   HGB 12.0 (L) 12/25/2015   HCT 34.9 (L) 12/25/2015   MCV 92.7 12/25/2015   PLT 168 12/25/2015    BMET    Component Value Date/Time   NA 132 (L) 08/31/2015 0516   NA 139 01/22/2013 0348   K 4.7 08/31/2015 0516   K 4.2 01/22/2013 0348   CL 103 08/31/2015 0516  CL 106 01/22/2013 0348   CO2 24 08/31/2015 0516   CO2 28 01/22/2013 0348   GLUCOSE 260 (H) 08/31/2015 0516   GLUCOSE 158 (H) 01/22/2013 0348   BUN 18 08/31/2015 0516   BUN 16  01/22/2013 0348   CREATININE 0.95 08/31/2015 0516   CREATININE 1.02 01/22/2013 0348   CALCIUM 8.7 (L) 08/31/2015 0516   CALCIUM 8.6 01/22/2013 0348   GFRNONAA >60 08/31/2015 0516   GFRNONAA >60 01/22/2013 0348   GFRAA >60 08/31/2015 0516   GFRAA >60 01/22/2013 0348   CrCl cannot be calculated (Patient's most recent lab result is older than the maximum 21 days allowed.).  COAG Lab Results  Component Value Date   INR 2.7 02/19/2016   INR 2.5 01/01/2016   INR 2.1 11/20/2015    Radiology No results found.    Assessment/Plan 1. Carotid disease, bilateral ([place]) Recommend:  Given the patient's asymptomatic subcritical stenosis no further invasive testing or surgery at this time.  Duplex ultrasound shows <80% stenosis bilaterally.  Continue antiplatelet therapy as prescribed Continue management of CAD, HTN and Hyperlipidemia Healthy heart diet,  encouraged exercise at least 4 times per week Follow up in 12 months with duplex ultrasound and physical exam based on <50% stenosis of the Rt carotid artery   - VAS US CAROTID; Future  2. 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 Essential hypertension Continue home medications as ordered no changes  4. Atrial fibrillation, unspecified type ([place]) Continue antiarrhythmic medications  5. Mixed hyperlipidemia Continue statin    Hortencia Pilar, MD  03/12/2016 9:18 PM    This note was created with Dragon medical transcription system.  Any errors from dictation are purely unintentional

## 2016-03-22 DIAGNOSIS — G4733 Obstructive sleep apnea (adult) (pediatric): Secondary | ICD-10-CM | POA: Diagnosis not present

## 2016-03-22 DIAGNOSIS — I2510 Atherosclerotic heart disease of native coronary artery without angina pectoris: Secondary | ICD-10-CM | POA: Diagnosis not present

## 2016-03-22 DIAGNOSIS — R0689 Other abnormalities of breathing: Secondary | ICD-10-CM | POA: Diagnosis not present

## 2016-03-22 DIAGNOSIS — R011 Cardiac murmur, unspecified: Secondary | ICD-10-CM | POA: Diagnosis not present

## 2016-03-27 ENCOUNTER — Ambulatory Visit: Payer: Self-pay | Admitting: Internal Medicine

## 2016-04-01 ENCOUNTER — Ambulatory Visit (INDEPENDENT_AMBULATORY_CARE_PROVIDER_SITE_OTHER): Payer: PPO

## 2016-04-01 ENCOUNTER — Other Ambulatory Visit: Payer: Self-pay | Admitting: *Deleted

## 2016-04-01 ENCOUNTER — Inpatient Hospital Stay: Payer: PPO | Attending: Internal Medicine

## 2016-04-01 DIAGNOSIS — Z801 Family history of malignant neoplasm of trachea, bronchus and lung: Secondary | ICD-10-CM | POA: Diagnosis not present

## 2016-04-01 DIAGNOSIS — E669 Obesity, unspecified: Secondary | ICD-10-CM | POA: Diagnosis not present

## 2016-04-01 DIAGNOSIS — I350 Nonrheumatic aortic (valve) stenosis: Secondary | ICD-10-CM | POA: Insufficient documentation

## 2016-04-01 DIAGNOSIS — Z85828 Personal history of other malignant neoplasm of skin: Secondary | ICD-10-CM | POA: Diagnosis not present

## 2016-04-01 DIAGNOSIS — E785 Hyperlipidemia, unspecified: Secondary | ICD-10-CM | POA: Insufficient documentation

## 2016-04-01 DIAGNOSIS — N189 Chronic kidney disease, unspecified: Secondary | ICD-10-CM | POA: Insufficient documentation

## 2016-04-01 DIAGNOSIS — I252 Old myocardial infarction: Secondary | ICD-10-CM | POA: Diagnosis not present

## 2016-04-01 DIAGNOSIS — Z800 Family history of malignant neoplasm of digestive organs: Secondary | ICD-10-CM | POA: Diagnosis not present

## 2016-04-01 DIAGNOSIS — D500 Iron deficiency anemia secondary to blood loss (chronic): Secondary | ICD-10-CM | POA: Insufficient documentation

## 2016-04-01 DIAGNOSIS — D509 Iron deficiency anemia, unspecified: Secondary | ICD-10-CM

## 2016-04-01 DIAGNOSIS — R0602 Shortness of breath: Secondary | ICD-10-CM | POA: Insufficient documentation

## 2016-04-01 DIAGNOSIS — G4730 Sleep apnea, unspecified: Secondary | ICD-10-CM | POA: Diagnosis not present

## 2016-04-01 DIAGNOSIS — Z7901 Long term (current) use of anticoagulants: Secondary | ICD-10-CM

## 2016-04-01 DIAGNOSIS — Z8711 Personal history of peptic ulcer disease: Secondary | ICD-10-CM | POA: Insufficient documentation

## 2016-04-01 DIAGNOSIS — I2510 Atherosclerotic heart disease of native coronary artery without angina pectoris: Secondary | ICD-10-CM | POA: Insufficient documentation

## 2016-04-01 DIAGNOSIS — Z8052 Family history of malignant neoplasm of bladder: Secondary | ICD-10-CM | POA: Insufficient documentation

## 2016-04-01 DIAGNOSIS — I4891 Unspecified atrial fibrillation: Secondary | ICD-10-CM | POA: Insufficient documentation

## 2016-04-01 DIAGNOSIS — K219 Gastro-esophageal reflux disease without esophagitis: Secondary | ICD-10-CM | POA: Insufficient documentation

## 2016-04-01 DIAGNOSIS — I480 Paroxysmal atrial fibrillation: Secondary | ICD-10-CM | POA: Insufficient documentation

## 2016-04-01 DIAGNOSIS — I1310 Hypertensive heart and chronic kidney disease without heart failure, with stage 1 through stage 4 chronic kidney disease, or unspecified chronic kidney disease: Secondary | ICD-10-CM | POA: Diagnosis not present

## 2016-04-01 DIAGNOSIS — Z86010 Personal history of colonic polyps: Secondary | ICD-10-CM | POA: Insufficient documentation

## 2016-04-01 DIAGNOSIS — Z809 Family history of malignant neoplasm, unspecified: Secondary | ICD-10-CM | POA: Diagnosis not present

## 2016-04-01 DIAGNOSIS — Z8673 Personal history of transient ischemic attack (TIA), and cerebral infarction without residual deficits: Secondary | ICD-10-CM | POA: Diagnosis not present

## 2016-04-01 DIAGNOSIS — E1122 Type 2 diabetes mellitus with diabetic chronic kidney disease: Secondary | ICD-10-CM | POA: Diagnosis not present

## 2016-04-01 LAB — CBC WITH DIFFERENTIAL/PLATELET
Basophils Absolute: 0 10*3/uL (ref 0–0.1)
Basophils Relative: 1 %
Eosinophils Absolute: 0.1 10*3/uL (ref 0–0.7)
Eosinophils Relative: 3 %
HCT: 31.6 % — ABNORMAL LOW (ref 40.0–52.0)
Hemoglobin: 10.8 g/dL — ABNORMAL LOW (ref 13.0–18.0)
Lymphocytes Relative: 27 %
Lymphs Abs: 1 10*3/uL (ref 1.0–3.6)
MCH: 31.8 pg (ref 26.0–34.0)
MCHC: 34.1 g/dL (ref 32.0–36.0)
MCV: 93.3 fL (ref 80.0–100.0)
Monocytes Absolute: 0.4 10*3/uL (ref 0.2–1.0)
Monocytes Relative: 12 %
Neutro Abs: 2 10*3/uL (ref 1.4–6.5)
Neutrophils Relative %: 57 %
Platelets: 173 10*3/uL (ref 150–440)
RBC: 3.39 MIL/uL — ABNORMAL LOW (ref 4.40–5.90)
RDW: 13.5 % (ref 11.5–14.5)
WBC: 3.5 10*3/uL — ABNORMAL LOW (ref 3.8–10.6)

## 2016-04-01 LAB — FERRITIN: Ferritin: 10 ng/mL — ABNORMAL LOW (ref 24–336)

## 2016-04-01 LAB — IRON AND TIBC
Iron: 61 ug/dL (ref 45–182)
Saturation Ratios: 16 % — ABNORMAL LOW (ref 17.9–39.5)
TIBC: 379 ug/dL (ref 250–450)
UIBC: 318 ug/dL

## 2016-04-01 LAB — POCT INR: INR: 2.6

## 2016-04-03 ENCOUNTER — Encounter: Payer: Self-pay | Admitting: Internal Medicine

## 2016-04-03 ENCOUNTER — Inpatient Hospital Stay: Payer: PPO | Admitting: Internal Medicine

## 2016-04-03 ENCOUNTER — Inpatient Hospital Stay (HOSPITAL_BASED_OUTPATIENT_CLINIC_OR_DEPARTMENT_OTHER): Payer: PPO | Admitting: Internal Medicine

## 2016-04-03 VITALS — BP 157/55 | HR 53 | Temp 96.8°F | Wt 195.1 lb

## 2016-04-03 DIAGNOSIS — Z85828 Personal history of other malignant neoplasm of skin: Secondary | ICD-10-CM

## 2016-04-03 DIAGNOSIS — I480 Paroxysmal atrial fibrillation: Secondary | ICD-10-CM

## 2016-04-03 DIAGNOSIS — Z7901 Long term (current) use of anticoagulants: Secondary | ICD-10-CM

## 2016-04-03 DIAGNOSIS — Z86010 Personal history of colonic polyps: Secondary | ICD-10-CM

## 2016-04-03 DIAGNOSIS — G4730 Sleep apnea, unspecified: Secondary | ICD-10-CM

## 2016-04-03 DIAGNOSIS — E1122 Type 2 diabetes mellitus with diabetic chronic kidney disease: Secondary | ICD-10-CM

## 2016-04-03 DIAGNOSIS — Z8711 Personal history of peptic ulcer disease: Secondary | ICD-10-CM

## 2016-04-03 DIAGNOSIS — Z8052 Family history of malignant neoplasm of bladder: Secondary | ICD-10-CM

## 2016-04-03 DIAGNOSIS — Z801 Family history of malignant neoplasm of trachea, bronchus and lung: Secondary | ICD-10-CM

## 2016-04-03 DIAGNOSIS — Z8673 Personal history of transient ischemic attack (TIA), and cerebral infarction without residual deficits: Secondary | ICD-10-CM

## 2016-04-03 DIAGNOSIS — I1310 Hypertensive heart and chronic kidney disease without heart failure, with stage 1 through stage 4 chronic kidney disease, or unspecified chronic kidney disease: Secondary | ICD-10-CM

## 2016-04-03 DIAGNOSIS — D500 Iron deficiency anemia secondary to blood loss (chronic): Secondary | ICD-10-CM

## 2016-04-03 DIAGNOSIS — I350 Nonrheumatic aortic (valve) stenosis: Secondary | ICD-10-CM

## 2016-04-03 DIAGNOSIS — I252 Old myocardial infarction: Secondary | ICD-10-CM

## 2016-04-03 DIAGNOSIS — I4891 Unspecified atrial fibrillation: Secondary | ICD-10-CM | POA: Diagnosis not present

## 2016-04-03 DIAGNOSIS — E669 Obesity, unspecified: Secondary | ICD-10-CM

## 2016-04-03 DIAGNOSIS — I2510 Atherosclerotic heart disease of native coronary artery without angina pectoris: Secondary | ICD-10-CM | POA: Diagnosis not present

## 2016-04-03 DIAGNOSIS — N189 Chronic kidney disease, unspecified: Secondary | ICD-10-CM

## 2016-04-03 DIAGNOSIS — R0602 Shortness of breath: Secondary | ICD-10-CM

## 2016-04-03 DIAGNOSIS — D508 Other iron deficiency anemias: Secondary | ICD-10-CM

## 2016-04-03 DIAGNOSIS — K219 Gastro-esophageal reflux disease without esophagitis: Secondary | ICD-10-CM

## 2016-04-03 DIAGNOSIS — E785 Hyperlipidemia, unspecified: Secondary | ICD-10-CM

## 2016-04-03 DIAGNOSIS — Z809 Family history of malignant neoplasm, unspecified: Secondary | ICD-10-CM

## 2016-04-03 DIAGNOSIS — Z800 Family history of malignant neoplasm of digestive organs: Secondary | ICD-10-CM

## 2016-04-03 NOTE — Progress Notes (Signed)
[facility] OFFICE PROGRESS NOTE  Patient Care Team: Rusty Aus, MD as PCP - General (Internal Medicine)   SUMMARY OF ONCOLOGIC HISTORY:  # MARCH 2017- April 2017- IRON DEFICIENCY ANEMIA ? Etiology s/p IV iron [EGD- ?June 2017- Bleeding gastric "polyp"/ colo- Dr.Gessner]  # hx of A.fib- on coumadin-HOLD April 2017/ CAD [Dr.[name]]; Hx of gastric ulcer [Dr.Gessner];   TIA/ CEA [s/p April 2017]  History of present illness:   [name] is here for follow-up of his severe iron deficiency anemia-  He received IV iron in September 2017 fine deficiency anemia the source of blood loss was felt to be a bleeding polyp in the upper GI tract. Colonoscopy was normal. He has been taking oral iron supplements as well along with B12. Today he returns with no complaints of excessive fatigue pallor he states his stool is.because of iron and has not noted any frank blood. He has extensive comorbidities related to heart disease and chronic kidney disease is being followed by cardiology he has atrial fibrillation and because of high risk for stroke he has been put back on Coumadin and the INR is being followed regularly and has been therapeutic. Patient reports no easy bruising, chest pain or shortness of breath.    REVIEW OF SYSTEMS:  A complete 10 point review of system is done which is negative except mentioned above/history of present illness.   PAST MEDICAL HISTORY :  Past Medical History  Diagnosis Date  . Hypertensive heart disease   . Hyperlipidemia   . Diabetes mellitus type II, controlled ([place])     a. Variable CBG 02/2012 - several meds adjusted.  . Mild aortic stenosis     a. 03/2011 Echo: EF 55-60%, Mild AS. b. Mild by cath 02/2012; c. 01/2014 Echo: EF 65-70%, Gr 1 DD, mild AS, mildly dil LA.  . Obesity   . GERD (gastroesophageal reflux disease)   . PAF (paroxysmal atrial fibrillation) ([place])     a. Newly dx 02/2012 & initiated on Coumadin (spont  converted to NSR).  Marland Kitchen CAD (coronary artery disease)     a. 01/2010 CABG x 4: LIMA->LAD, VG->D1, VG->OM1, VG->PDA. b. NSTEMI 02/2012 in setting of AF-RVR with cath s/p BMS to RCA (plan for 1 month, possibly up to 3 months of Plavix)  . Carotid disease, bilateral ([place])     a. 03/2011 U/S: 40-59% bilat Carotid dzs;  b. 04/2015 Carotid U/S: 40-59% bilat ICA stenosis; c. 05/2015 CTA Neck: [id number] RICA, [id number] LICA, mod-marked R vertebral stenosis, mod L vertebral stenosis.  . Myocardial infarction ([place]) 2014  . Hypertension   . Shortness of breath dyspnea   . Sleep apnea     CPAP with oxygen  . Chronic kidney disease   . Basal cell carcinoma of skin   . Anemia   . Macular edema bil    PAST SURGICAL HISTORY :   Past Surgical History  Procedure Laterality Date  . Cyst l kidney  [employer]  . Angioplasty    . Cardiac catheterization    . Left heart catheterization with coronary angiogram N/A 03/09/2012    Procedure: LEFT HEART CATHETERIZATION WITH CORONARY ANGIOGRAM;  Surgeon: Wellington Hampshire, MD;  Location: [facility] CATH LAB;  Service: Cardiovascular;  Laterality: N/A;  . Percutaneous coronary stent intervention (pci-s)  03/09/2012    Procedure: PERCUTANEOUS CORONARY STENT INTERVENTION (PCI-S);  Surgeon: Wellington Hampshire, MD;  Location: [facility] CATH LAB;  Service: Cardiovascular;;  .  Coronary artery bypass graft  09-08-2009    [place], [place], [place],   . Coronary angioplasty      Dr. Fletcher Anon  . Cataract extraction Bilateral     Cataract Extraction with IOL  . Inguinal hernia repair Right     Right Inguinal Hernia Repair    FAMILY HISTORY :   Family History  Problem Relation Age of Onset  . COPD Mother     alive 36  . Heart attack Father     09/08/13 deceased  . Bladder Cancer Mother   . Lung cancer Maternal Aunt   . Liver cancer Maternal Aunt   . Kidney disease Paternal Uncle   . Cancer Other     all paternal aunts and uncles    SOCIAL HISTORY:   Social History  Substance Use Topics  .  Smoking status: Never Smoker   . Smokeless tobacco: Never Used     Comment: tobacco use - no  . Alcohol Use: Yes     Comment: rare drink    Physical exam shows him to be alert awake oriented 3, no pallor, no icterus. No peripheral edema. No acute distress. Vitals show blood pressure 1 5755 pulse rate is 53 regular temperature is 96.8 weight is stable at 195 pounds. ALLERGIES:  has No Known Allergies.  Recent Results (from the past 09-09-58 hour(s))  POCT INR     Status: None   Collection Time: 02/19/16  3:06 PM  Result Value Ref Range   INR 2.7   POCT INR     Status: None   Collection Time: 04/01/16  2:17 PM  Result Value Ref Range   INR 2.6   CBC with Differential     Status: Abnormal   Collection Time: 04/01/16  2:42 PM  Result Value Ref Range   WBC 3.5 (L) 3.8 - 10.6 K/uL   RBC 3.39 (L) 4.40 - 5.90 MIL/uL   Hemoglobin 10.8 (L) 13.0 - 18.0 g/dL   HCT 31.6 (L) 40.0 - 52.0 %   MCV 93.3 80.0 - 100.0 fL   MCH 31.8 26.0 - 34.0 pg   MCHC 34.1 32.0 - 36.0 g/dL   RDW 13.5 11.5 - 14.5 %   Platelets 173 150 - 440 K/uL   Neutrophils Relative % 57 %   Neutro Abs 2.0 1.4 - 6.5 K/uL   Lymphocytes Relative 27 %   Lymphs Abs 1.0 1.0 - 3.6 K/uL   Monocytes Relative 12 %   Monocytes Absolute 0.4 0.2 - 1.0 K/uL   Eosinophils Relative 3 %   Eosinophils Absolute 0.1 0 - 0.7 K/uL   Basophils Relative 1 %   Basophils Absolute 0.0 0 - 0.1 K/uL  Ferritin     Status: Abnormal   Collection Time: 04/01/16  2:42 PM  Result Value Ref Range   Ferritin 10 (L) 24 - 336 ng/mL  Iron and TIBC     Status: Abnormal   Collection Time: 04/01/16  2:42 PM  Result Value Ref Range   Iron 61 45 - 182 ug/dL   TIBC 379 250 - 450 ug/dL   Saturation Ratios 16 (L) 17.9 - 39.5 %   UIBC 318 ug/dL    ASSESSMENT & PLAN:   Iron deficiency anemia due to chronic GI bleed. He is on Coumadin maintained at the rapid level. His iron panel shows once again and decline in ferritin and his hemoglobin has declined to 10.8.  He will knees IV iron however today it was too late  to infuse therefore he'll return next week for Feraheme 2 doses to be given weekly. He will then return for follow-up in 3 months with iron panel and CBC. He was encouraged to discuss the risks benefits with Coumadin carefully with his cardiologist at his upcoming visit in January. I've asked him to stop taking oral iron to make it easy for him to be able to pick up any melena resulting from GI bleed in which case he was recommended to seek emergency care.

## 2016-04-07 ENCOUNTER — Inpatient Hospital Stay: Payer: PPO

## 2016-04-07 VITALS — BP 153/63 | HR 50 | Temp 96.7°F | Resp 18

## 2016-04-07 DIAGNOSIS — D508 Other iron deficiency anemias: Secondary | ICD-10-CM

## 2016-04-07 DIAGNOSIS — D500 Iron deficiency anemia secondary to blood loss (chronic): Secondary | ICD-10-CM | POA: Diagnosis not present

## 2016-04-07 MED ORDER — SODIUM CHLORIDE 0.9 % IV SOLN
510.0000 mg | Freq: Once | INTRAVENOUS | Status: AC
  Administered 2016-04-07: 510 mg via INTRAVENOUS
  Filled 2016-04-07: qty 17

## 2016-04-07 MED ORDER — SODIUM CHLORIDE 0.9 % IV SOLN
Freq: Once | INTRAVENOUS | Status: AC
  Administered 2016-04-07: 15:00:00 via INTRAVENOUS
  Filled 2016-04-07: qty 1000

## 2016-04-14 ENCOUNTER — Inpatient Hospital Stay: Payer: PPO

## 2016-04-14 VITALS — BP 160/74 | HR 56 | Temp 97.7°F | Resp 18

## 2016-04-14 DIAGNOSIS — D508 Other iron deficiency anemias: Secondary | ICD-10-CM

## 2016-04-14 DIAGNOSIS — D500 Iron deficiency anemia secondary to blood loss (chronic): Secondary | ICD-10-CM | POA: Diagnosis not present

## 2016-04-14 MED ORDER — SODIUM CHLORIDE 0.9 % IV SOLN
Freq: Once | INTRAVENOUS | Status: AC
  Administered 2016-04-14: 20 mL/h via INTRAVENOUS
  Filled 2016-04-14: qty 1000

## 2016-04-14 MED ORDER — SODIUM CHLORIDE 0.9 % IV SOLN
510.0000 mg | Freq: Once | INTRAVENOUS | Status: AC
  Administered 2016-04-14: 510 mg via INTRAVENOUS
  Filled 2016-04-14: qty 17

## 2016-04-22 DIAGNOSIS — I2510 Atherosclerotic heart disease of native coronary artery without angina pectoris: Secondary | ICD-10-CM | POA: Diagnosis not present

## 2016-04-22 DIAGNOSIS — R0689 Other abnormalities of breathing: Secondary | ICD-10-CM | POA: Diagnosis not present

## 2016-04-22 DIAGNOSIS — R011 Cardiac murmur, unspecified: Secondary | ICD-10-CM | POA: Diagnosis not present

## 2016-04-22 DIAGNOSIS — G4733 Obstructive sleep apnea (adult) (pediatric): Secondary | ICD-10-CM | POA: Diagnosis not present

## 2016-04-27 ENCOUNTER — Encounter: Payer: Self-pay | Admitting: Cardiovascular Disease

## 2016-04-27 ENCOUNTER — Telehealth: Payer: Self-pay | Admitting: Cardiovascular Disease

## 2016-04-27 ENCOUNTER — Other Ambulatory Visit: Payer: Self-pay

## 2016-04-27 ENCOUNTER — Ambulatory Visit (INDEPENDENT_AMBULATORY_CARE_PROVIDER_SITE_OTHER): Payer: PPO | Admitting: Cardiovascular Disease

## 2016-04-27 VITALS — BP 138/60 | HR 48 | Ht 68.0 in | Wt 194.5 lb

## 2016-04-27 DIAGNOSIS — I214 Non-ST elevation (NSTEMI) myocardial infarction: Secondary | ICD-10-CM | POA: Diagnosis not present

## 2016-04-27 DIAGNOSIS — I739 Peripheral vascular disease, unspecified: Secondary | ICD-10-CM

## 2016-04-27 DIAGNOSIS — I350 Nonrheumatic aortic (valve) stenosis: Secondary | ICD-10-CM | POA: Diagnosis not present

## 2016-04-27 DIAGNOSIS — I25708 Atherosclerosis of coronary artery bypass graft(s), unspecified, with other forms of angina pectoris: Secondary | ICD-10-CM | POA: Diagnosis not present

## 2016-04-27 DIAGNOSIS — I6523 Occlusion and stenosis of bilateral carotid arteries: Secondary | ICD-10-CM | POA: Diagnosis not present

## 2016-04-27 DIAGNOSIS — I119 Hypertensive heart disease without heart failure: Secondary | ICD-10-CM

## 2016-04-27 DIAGNOSIS — I779 Disorder of arteries and arterioles, unspecified: Secondary | ICD-10-CM

## 2016-04-27 DIAGNOSIS — I4891 Unspecified atrial fibrillation: Secondary | ICD-10-CM

## 2016-04-27 NOTE — Patient Instructions (Signed)
Medication Instructions:  Your physician recommends that you continue on your current medications as directed. Please refer to the Current Medication list given to you today.   Labwork: none  Testing/Procedures: Your physician has requested that you have an echocardiogram in 6 months. Echocardiography is a painless test that uses sound waves to create images of your heart. It provides your doctor with information about the size and shape of your heart and how well your heart's chambers and valves are working. This procedure takes approximately one hour. There are no restrictions for this procedure.    Follow-Up: Your physician wants you to follow-up in: six months with Dr. Fletcher Anon.  You will receive a reminder letter in the mail two months in advance. If you don't receive a letter, please call our office to schedule the follow-up appointment.   Any Other Special Instructions Will Be Listed Below (If Applicable).     If you need a refill on your cardiac medications before your next appointment, please call your pharmacy.

## 2016-04-27 NOTE — Telephone Encounter (Signed)
S/w pt who understands that per Dr. Fletcher Anon, he is to stop taking aspirin. Medication list updated.

## 2016-04-27 NOTE — Progress Notes (Signed)
Cardiology Office Note   Date:  04/27/2016   ID:  Seth Hardy, DOB [birth year], MRN [id number]  PCP:  Rusty Aus, MD  Cardiologist:   Kathlyn Sacramento, MD   Chief Complaint  Patient presents with  . other    82mo f/u carotid duplex bilateral. Pt states he is doing well; has had 2 iron transfusions in Nov/2017. Reviewed meds with pt verablly.      History of Present Illness: Seth Hardy is a 70 y.o. male who Is here today for a follow-up visit . He has a history of coronary artery disease and is status post bypass grafting in September 2011 with subsequent bare metal stenting of the right coronary artery in 2013. He also has a history of persistent atrial fibrillation maintained in sinus rhythm on small dose amiodarone and is chronically anticoagulated on Coumadin. He has other chronic medical conditions that include aortic stenosis, hypertension, hyperlipidemia, bilateral carotid arterial disease status post left carotid endarterectomy in April 2017, and diabetes.   Most recent echocardiogram was done at [facility] in 05/2015 which showed normal LV systolic function with mild to moderate aortic stenosis. Aortic valve mean gradient was 18 mmHg.  He has known history of iron deficiency anemia due to gastric polyp which was resected. He continues to receive iron transfusion. He is doing well and denies any chest pain, shortness of breath, palpitations or dizziness.  Past Medical History:  Diagnosis Date  . Anemia 08/2015   received 2 units rbc one week ago, 3 IV iron infusion -last 1 week.  . Basal cell carcinoma of skin 2012   removed several spots from arms and back  . CAD (coronary artery disease)    a. 01/2010 CABG x 4: LIMA->LAD, VG->D1, VG->OM1, VG->PDA. b. NSTEMI 02/2012 in setting of AF-RVR with cath s/p BMS to RCA (plan for 1 month, possibly up to 3 months of Plavix)  . Carotid disease, bilateral ([place])    a. 03/2011 U/S: 40-59% bilat Carotid dzs;  b. 04/2015 Carotid U/S:  40-59% bilat ICA stenosis; c. 05/2015 CTA Neck: [id number] RICA, [id number] LICA, mod-marked R vertebral stenosis, mod L vertebral stenosis.  . Chronic kidney disease    small obtusion per pcp. ultrasound done on 08/29/2015  . Diabetes mellitus type II, controlled ([place])    a. Variable CBG 02/2012 - several meds adjusted.  Marland Kitchen Dysrhythmia    intermittent Atrial Fibrillation  . GERD (gastroesophageal reflux disease)   . Hyperlipidemia   . Hypertension   . Hypertensive heart disease   . Macular edema bil   lazer work done previously  . Mild aortic stenosis    a. 03/2011 Echo: EF 55-60%, Mild AS. b. Mild by cath 02/2012; c. 01/2014 Echo: EF 65-70%, Gr 1 DD, mild AS, mildly dil LA.  . Multiple gastric ulcers 2006  . Myocardial infarction 2014   stents (x1) at time  . Obesity   . PAF (paroxysmal atrial fibrillation) ([place])    a. Newly dx 02/2012 & initiated on Coumadin (spont converted to NSR).  . Shortness of breath dyspnea   . Sleep apnea    CPAP settings 3 with oxygen 2.5  . Transfusion history    transfusions -1 month ago -2 units    Past Surgical History:  Procedure Laterality Date  . ANGIOPLASTY    . CARDIAC CATHETERIZATION  2014  . CATARACT EXTRACTION Bilateral 2016   Cataract Extraction with IOL  . COLONOSCOPY WITH PROPOFOL N/A 10/15/2015   Procedure: COLONOSCOPY  WITH PROPOFOL;  Surgeon: Gatha Mayer, MD;  Location: [facility];  Service: Endoscopy;  Laterality: N/A;  . CORONARY ANGIOPLASTY     Dr. Fletcher Anon  . CORONARY ARTERY BYPASS GRAFT  2011   [place], [place], [place],   . cyst L kidney  [place]  . ENDARTERECTOMY Left 08/30/2015   Procedure: ENDARTERECTOMY CAROTID;  Surgeon: Katha Cabal, MD;  Location: [facility] ORS;  Service: Vascular;  Laterality: Left;  . ESOPHAGOGASTRODUODENOSCOPY (EGD) WITH PROPOFOL N/A 10/15/2015   Procedure: ESOPHAGOGASTRODUODENOSCOPY (EGD) WITH PROPOFOL;  Surgeon: Gatha Mayer, MD;  Location: [facility];  Service: Endoscopy;  Laterality: N/A;  .  INGUINAL HERNIA REPAIR Right    Right Inguinal Hernia Repair  . LEFT HEART CATHETERIZATION WITH CORONARY ANGIOGRAM N/A 03/09/2012   Procedure: LEFT HEART CATHETERIZATION WITH CORONARY ANGIOGRAM;  Surgeon: Wellington Hampshire, MD;  Location: [facility] LAB;  Service: Cardiovascular;  Laterality: N/A;  . PERCUTANEOUS CORONARY STENT INTERVENTION (PCI-S)  03/09/2012   Procedure: PERCUTANEOUS CORONARY STENT INTERVENTION (PCI-S);  Surgeon: Wellington Hampshire, MD;  Location: [facility] CATH LAB;  Service: Cardiovascular;;     Current Outpatient Prescriptions  Medication Sig Dispense Refill  . acetaminophen (TYLENOL) 325 MG tablet Take 650 mg by mouth every 6 (six) hours as needed. For pain    . amiodarone (PACERONE) 200 MG tablet Take 0.5 tablets (100 mg total) by mouth at bedtime. 45 tablet 3  . amLODipine (NORVASC) 5 MG tablet Take one tablet by mouth one time daily (Patient taking differently: Take 5 mg by mouth daily. ) 90 tablet 3  . aspirin 81 MG EC tablet Take 81 mg by mouth daily.      Marland Kitchen atorvastatin (LIPITOR) 20 MG tablet TAKE ONE TABLET BY MOUTH AT BEDTIME 90 tablet 3  . Cyanocobalamin (B-12) 1000 MCG CAPS Take 1,000 mcg by mouth daily.     . furosemide (LASIX) 20 MG tablet Take 1 tablet (20 mg total) by mouth as needed for fluid or edema. 30 tablet 6  . glimepiride (AMARYL) 4 MG tablet Take 4 mg by mouth 2 (two) times daily.      . insulin glargine (LANTUS) 100 UNIT/ML injection Inject 20 Units into the skin daily.     . insulin lispro (HUMALOG) 100 UNIT/ML injection Per sliding scale. 10 mL 11  . metFORMIN (GLUCOPHAGE) 500 MG tablet Take 500 mg by mouth 2 (two) times daily with a meal.     . nitroGLYCERIN (NITROSTAT) 0.4 MG SL tablet Place 1 tablet (0.4 mg total) under the tongue every 5 (five) minutes as needed (up to 3 doses). (Patient taking differently: Place 0.4 mg under the tongue every 5 (five) minutes as needed for chest pain (up to 3 doses). ) 25 tablet 1  . Omega-3 Fatty Acids (FISH OIL) 1200  MG CAPS Take 1,200 mg by mouth daily. Reported on 08/27/2015    . oxyCODONE-acetaminophen (PERCOCET/ROXICET) 5-325 MG tablet Take 1 tablet by mouth every 6 (six) hours as needed for severe pain. 30 tablet 0  . OXYGEN Inhale 2.5 L/min into the lungs at bedtime. With CPAP    . pantoprazole (PROTONIX) 40 MG tablet Take 1 tablet (40 mg total) by mouth daily. 90 tablet 3  . ramipril (ALTACE) 10 MG capsule Take 10 mg by mouth daily.     . vitamin C (ASCORBIC ACID) 500 MG tablet Take 500 mg by mouth daily.     No current facility-administered medications for this visit.  Allergies:   Patient has no known allergies.    Social History:  The patient  reports that he has never smoked. He has never used smokeless tobacco. He reports that he drinks alcohol. He reports that he does not use drugs.   Family History:  [name] family history includes Bladder Cancer in his mother; COPD in his mother; Cancer in his other; Heart attack in his father; Kidney disease in his paternal uncle; Liver cancer in his maternal aunt; Lung cancer in his maternal aunt.    ROS:  Please see the history of present illness.   Otherwise, review of systems are positive for none.   All other systems are reviewed and negative.    PHYSICAL EXAM: VS:  BP 138/60 (BP Location: Left Arm, Patient Position: Sitting, Cuff Size: Normal)   Pulse (!) 48   Ht 5\' 8"  (1.727 m)   Wt 194 lb 8 oz (88.2 kg)   BMI 29.57 kg/m  , BMI Body mass index is 29.57 kg/m. GEN: Well nourished, well developed, in no acute distress  HEENT: normal  Neck: no JVD, carotid bruits, or masses Cardiac: RRR; no rubs, or gallops,no edema . There is a 3/6 crescendo decrescendo systolic murmur in the aortic area which is mid peaking. The murmur radiates to the carotid arteries. Respiratory:  clear to auscultation bilaterally, normal work of breathing GI: soft, nontender, nondistended, + BS MS: no deformity or atrophy  Skin: warm and dry, no rash Neuro:   Strength and sensation are intact Psych: euthymic mood, full affect   EKG:  EKG  ordered today. EKG showed sinus bradycardia with lateral T wave changes suggestive of ischemia. Borderline LVH   Recent Labs: 06/25/2015: ALT 33 08/31/2015: BUN 18; Creatinine, Ser 0.95; Potassium 4.7; Sodium 132 04/01/2016: Hemoglobin 10.8; Platelets 173    Lipid Panel    Component Value Date/Time   CHOL 99 03/09/2012 0243   TRIG 84 03/09/2012 0243   HDL 37 (L) 03/09/2012 0243   CHOLHDL 2.7 03/09/2012 0243   VLDL 17 03/09/2012 0243   LDLCALC 45 03/09/2012 0243      Wt Readings from Last 3 Encounters:  04/27/16 194 lb 8 oz (88.2 kg)  04/03/16 195 lb 1.7 oz (88.5 kg)  03/12/16 193 lb (87.5 kg)        ASSESSMENT AND PLAN:  1.  Coronary artery disease involving bypass graft without angina:  He is doing  Very well with no anginal symptoms. Given his iron deficiency anemia, I elected to discontinue aspirin especially that he is on anticoagulation. Cardiac stenting was 2013.  2. Bilateral carotid artery stenosis: Status post  left carotid endarterectomy. Followed by Dr. Delana Meyer.  3. Moderate aortic stenosis: I requested repeat echocardiogram in 6 months.  4. Persistent atrial fibrillation: Maintaining in sinus rhythm with small dose amiodarone.    Continue anticoagulation with warfarin. I reviewed his labs from September which showed normal TSH and liver profile.  5. Hyperlipidemia: Continue treatment with atorvastatin . I reviewed lipid profile in September which showed an LDL of 73, triglyceride of 132 and an HDL of 33.    Disposition:   FU with me in 6 months  Signed,  Kathlyn Sacramento, MD  04/27/2016 3:13 PM    [place]

## 2016-04-28 DIAGNOSIS — E113511 Type 2 diabetes mellitus with proliferative diabetic retinopathy with macular edema, right eye: Secondary | ICD-10-CM | POA: Diagnosis not present

## 2016-04-28 DIAGNOSIS — E113512 Type 2 diabetes mellitus with proliferative diabetic retinopathy with macular edema, left eye: Secondary | ICD-10-CM | POA: Diagnosis not present

## 2016-05-22 DIAGNOSIS — R011 Cardiac murmur, unspecified: Secondary | ICD-10-CM | POA: Diagnosis not present

## 2016-05-22 DIAGNOSIS — I2510 Atherosclerotic heart disease of native coronary artery without angina pectoris: Secondary | ICD-10-CM | POA: Diagnosis not present

## 2016-05-22 DIAGNOSIS — R0689 Other abnormalities of breathing: Secondary | ICD-10-CM | POA: Diagnosis not present

## 2016-05-22 DIAGNOSIS — G4733 Obstructive sleep apnea (adult) (pediatric): Secondary | ICD-10-CM | POA: Diagnosis not present

## 2016-05-29 ENCOUNTER — Ambulatory Visit: Payer: Self-pay

## 2016-06-03 ENCOUNTER — Ambulatory Visit (INDEPENDENT_AMBULATORY_CARE_PROVIDER_SITE_OTHER): Payer: PPO

## 2016-06-03 DIAGNOSIS — I4891 Unspecified atrial fibrillation: Secondary | ICD-10-CM | POA: Diagnosis not present

## 2016-06-03 DIAGNOSIS — Z7901 Long term (current) use of anticoagulants: Secondary | ICD-10-CM | POA: Diagnosis not present

## 2016-06-03 LAB — POCT INR: INR: 2.6

## 2016-06-04 ENCOUNTER — Ambulatory Visit: Payer: Self-pay

## 2016-06-10 DIAGNOSIS — G4733 Obstructive sleep apnea (adult) (pediatric): Secondary | ICD-10-CM | POA: Diagnosis not present

## 2016-06-10 DIAGNOSIS — R0689 Other abnormalities of breathing: Secondary | ICD-10-CM | POA: Diagnosis not present

## 2016-06-10 DIAGNOSIS — R011 Cardiac murmur, unspecified: Secondary | ICD-10-CM | POA: Diagnosis not present

## 2016-06-10 DIAGNOSIS — I2510 Atherosclerotic heart disease of native coronary artery without angina pectoris: Secondary | ICD-10-CM | POA: Diagnosis not present

## 2016-06-19 NOTE — Telephone Encounter (Signed)
error 

## 2016-06-26 ENCOUNTER — Other Ambulatory Visit: Payer: Self-pay

## 2016-06-29 ENCOUNTER — Inpatient Hospital Stay: Payer: PPO | Attending: Internal Medicine

## 2016-06-29 DIAGNOSIS — Z7901 Long term (current) use of anticoagulants: Secondary | ICD-10-CM | POA: Insufficient documentation

## 2016-06-29 DIAGNOSIS — I4891 Unspecified atrial fibrillation: Secondary | ICD-10-CM | POA: Insufficient documentation

## 2016-06-29 DIAGNOSIS — Z801 Family history of malignant neoplasm of trachea, bronchus and lung: Secondary | ICD-10-CM | POA: Insufficient documentation

## 2016-06-29 DIAGNOSIS — E119 Type 2 diabetes mellitus without complications: Secondary | ICD-10-CM | POA: Insufficient documentation

## 2016-06-29 DIAGNOSIS — K219 Gastro-esophageal reflux disease without esophagitis: Secondary | ICD-10-CM | POA: Insufficient documentation

## 2016-06-29 DIAGNOSIS — Z800 Family history of malignant neoplasm of digestive organs: Secondary | ICD-10-CM | POA: Insufficient documentation

## 2016-06-29 DIAGNOSIS — E669 Obesity, unspecified: Secondary | ICD-10-CM | POA: Insufficient documentation

## 2016-06-29 DIAGNOSIS — Z8711 Personal history of peptic ulcer disease: Secondary | ICD-10-CM | POA: Insufficient documentation

## 2016-06-29 DIAGNOSIS — I480 Paroxysmal atrial fibrillation: Secondary | ICD-10-CM | POA: Insufficient documentation

## 2016-06-29 DIAGNOSIS — Z85828 Personal history of other malignant neoplasm of skin: Secondary | ICD-10-CM | POA: Insufficient documentation

## 2016-06-29 DIAGNOSIS — I1310 Hypertensive heart and chronic kidney disease without heart failure, with stage 1 through stage 4 chronic kidney disease, or unspecified chronic kidney disease: Secondary | ICD-10-CM | POA: Insufficient documentation

## 2016-06-29 DIAGNOSIS — I252 Old myocardial infarction: Secondary | ICD-10-CM | POA: Insufficient documentation

## 2016-06-29 DIAGNOSIS — D509 Iron deficiency anemia, unspecified: Secondary | ICD-10-CM | POA: Insufficient documentation

## 2016-06-29 DIAGNOSIS — I350 Nonrheumatic aortic (valve) stenosis: Secondary | ICD-10-CM | POA: Insufficient documentation

## 2016-06-29 DIAGNOSIS — Z794 Long term (current) use of insulin: Secondary | ICD-10-CM | POA: Insufficient documentation

## 2016-06-29 DIAGNOSIS — E785 Hyperlipidemia, unspecified: Secondary | ICD-10-CM | POA: Insufficient documentation

## 2016-06-29 DIAGNOSIS — Z79899 Other long term (current) drug therapy: Secondary | ICD-10-CM | POA: Insufficient documentation

## 2016-06-29 DIAGNOSIS — Z8052 Family history of malignant neoplasm of bladder: Secondary | ICD-10-CM | POA: Insufficient documentation

## 2016-06-29 DIAGNOSIS — I2510 Atherosclerotic heart disease of native coronary artery without angina pectoris: Secondary | ICD-10-CM | POA: Insufficient documentation

## 2016-06-29 DIAGNOSIS — G4730 Sleep apnea, unspecified: Secondary | ICD-10-CM | POA: Insufficient documentation

## 2016-06-30 ENCOUNTER — Inpatient Hospital Stay: Payer: PPO

## 2016-06-30 ENCOUNTER — Other Ambulatory Visit: Payer: Self-pay | Admitting: *Deleted

## 2016-06-30 DIAGNOSIS — D509 Iron deficiency anemia, unspecified: Secondary | ICD-10-CM | POA: Diagnosis not present

## 2016-06-30 DIAGNOSIS — Z7901 Long term (current) use of anticoagulants: Secondary | ICD-10-CM | POA: Diagnosis not present

## 2016-06-30 DIAGNOSIS — Z800 Family history of malignant neoplasm of digestive organs: Secondary | ICD-10-CM | POA: Diagnosis not present

## 2016-06-30 DIAGNOSIS — E119 Type 2 diabetes mellitus without complications: Secondary | ICD-10-CM | POA: Diagnosis not present

## 2016-06-30 DIAGNOSIS — G4730 Sleep apnea, unspecified: Secondary | ICD-10-CM | POA: Diagnosis not present

## 2016-06-30 DIAGNOSIS — I252 Old myocardial infarction: Secondary | ICD-10-CM | POA: Diagnosis not present

## 2016-06-30 DIAGNOSIS — Z85828 Personal history of other malignant neoplasm of skin: Secondary | ICD-10-CM | POA: Diagnosis not present

## 2016-06-30 DIAGNOSIS — E785 Hyperlipidemia, unspecified: Secondary | ICD-10-CM | POA: Diagnosis not present

## 2016-06-30 DIAGNOSIS — K219 Gastro-esophageal reflux disease without esophagitis: Secondary | ICD-10-CM | POA: Diagnosis not present

## 2016-06-30 DIAGNOSIS — I1310 Hypertensive heart and chronic kidney disease without heart failure, with stage 1 through stage 4 chronic kidney disease, or unspecified chronic kidney disease: Secondary | ICD-10-CM | POA: Diagnosis not present

## 2016-06-30 DIAGNOSIS — Z8711 Personal history of peptic ulcer disease: Secondary | ICD-10-CM | POA: Diagnosis not present

## 2016-06-30 DIAGNOSIS — Z801 Family history of malignant neoplasm of trachea, bronchus and lung: Secondary | ICD-10-CM | POA: Diagnosis not present

## 2016-06-30 DIAGNOSIS — E669 Obesity, unspecified: Secondary | ICD-10-CM | POA: Diagnosis not present

## 2016-06-30 DIAGNOSIS — D508 Other iron deficiency anemias: Secondary | ICD-10-CM

## 2016-06-30 DIAGNOSIS — Z79899 Other long term (current) drug therapy: Secondary | ICD-10-CM | POA: Diagnosis not present

## 2016-06-30 DIAGNOSIS — Z8052 Family history of malignant neoplasm of bladder: Secondary | ICD-10-CM | POA: Diagnosis not present

## 2016-06-30 DIAGNOSIS — I480 Paroxysmal atrial fibrillation: Secondary | ICD-10-CM | POA: Diagnosis not present

## 2016-06-30 DIAGNOSIS — I4891 Unspecified atrial fibrillation: Secondary | ICD-10-CM | POA: Diagnosis not present

## 2016-06-30 DIAGNOSIS — I350 Nonrheumatic aortic (valve) stenosis: Secondary | ICD-10-CM | POA: Diagnosis not present

## 2016-06-30 DIAGNOSIS — I2510 Atherosclerotic heart disease of native coronary artery without angina pectoris: Secondary | ICD-10-CM | POA: Diagnosis not present

## 2016-06-30 DIAGNOSIS — Z794 Long term (current) use of insulin: Secondary | ICD-10-CM | POA: Diagnosis not present

## 2016-06-30 LAB — CBC WITH DIFFERENTIAL/PLATELET
Basophils Absolute: 0 10*3/uL (ref 0–0.1)
Basophils Relative: 1 %
Eosinophils Absolute: 0.2 10*3/uL (ref 0–0.7)
Eosinophils Relative: 4 %
HCT: 36.5 % — ABNORMAL LOW (ref 40.0–52.0)
Hemoglobin: 12.6 g/dL — ABNORMAL LOW (ref 13.0–18.0)
Lymphocytes Relative: 30 %
Lymphs Abs: 1.1 10*3/uL (ref 1.0–3.6)
MCH: 31.9 pg (ref 26.0–34.0)
MCHC: 34.4 g/dL (ref 32.0–36.0)
MCV: 92.7 fL (ref 80.0–100.0)
Monocytes Absolute: 0.5 10*3/uL (ref 0.2–1.0)
Monocytes Relative: 14 %
Neutro Abs: 1.8 10*3/uL (ref 1.4–6.5)
Neutrophils Relative %: 51 %
Platelets: 191 10*3/uL (ref 150–440)
RBC: 3.94 MIL/uL — ABNORMAL LOW (ref 4.40–5.90)
RDW: 13.8 % (ref 11.5–14.5)
WBC: 3.6 10*3/uL — ABNORMAL LOW (ref 3.8–10.6)

## 2016-06-30 LAB — IRON AND TIBC
Iron: 60 ug/dL (ref 45–182)
Saturation Ratios: 16 % — ABNORMAL LOW (ref 17.9–39.5)
TIBC: 374 ug/dL (ref 250–450)
UIBC: 314 ug/dL

## 2016-06-30 LAB — FERRITIN: Ferritin: 28 ng/mL (ref 24–336)

## 2016-07-03 ENCOUNTER — Inpatient Hospital Stay: Payer: PPO

## 2016-07-03 ENCOUNTER — Inpatient Hospital Stay (HOSPITAL_BASED_OUTPATIENT_CLINIC_OR_DEPARTMENT_OTHER): Payer: PPO | Admitting: Internal Medicine

## 2016-07-03 VITALS — BP 137/52 | HR 50 | Temp 97.7°F | Wt 192.2 lb

## 2016-07-03 DIAGNOSIS — I1310 Hypertensive heart and chronic kidney disease without heart failure, with stage 1 through stage 4 chronic kidney disease, or unspecified chronic kidney disease: Secondary | ICD-10-CM

## 2016-07-03 DIAGNOSIS — I350 Nonrheumatic aortic (valve) stenosis: Secondary | ICD-10-CM

## 2016-07-03 DIAGNOSIS — Z79899 Other long term (current) drug therapy: Secondary | ICD-10-CM

## 2016-07-03 DIAGNOSIS — D500 Iron deficiency anemia secondary to blood loss (chronic): Secondary | ICD-10-CM

## 2016-07-03 DIAGNOSIS — Z8711 Personal history of peptic ulcer disease: Secondary | ICD-10-CM

## 2016-07-03 DIAGNOSIS — Z800 Family history of malignant neoplasm of digestive organs: Secondary | ICD-10-CM

## 2016-07-03 DIAGNOSIS — E785 Hyperlipidemia, unspecified: Secondary | ICD-10-CM

## 2016-07-03 DIAGNOSIS — K219 Gastro-esophageal reflux disease without esophagitis: Secondary | ICD-10-CM

## 2016-07-03 DIAGNOSIS — Z85828 Personal history of other malignant neoplasm of skin: Secondary | ICD-10-CM

## 2016-07-03 DIAGNOSIS — Z801 Family history of malignant neoplasm of trachea, bronchus and lung: Secondary | ICD-10-CM

## 2016-07-03 DIAGNOSIS — Z794 Long term (current) use of insulin: Secondary | ICD-10-CM

## 2016-07-03 DIAGNOSIS — Z8052 Family history of malignant neoplasm of bladder: Secondary | ICD-10-CM

## 2016-07-03 DIAGNOSIS — D509 Iron deficiency anemia, unspecified: Secondary | ICD-10-CM

## 2016-07-03 DIAGNOSIS — I4891 Unspecified atrial fibrillation: Secondary | ICD-10-CM

## 2016-07-03 DIAGNOSIS — E119 Type 2 diabetes mellitus without complications: Secondary | ICD-10-CM

## 2016-07-03 DIAGNOSIS — I252 Old myocardial infarction: Secondary | ICD-10-CM

## 2016-07-03 DIAGNOSIS — I480 Paroxysmal atrial fibrillation: Secondary | ICD-10-CM

## 2016-07-03 DIAGNOSIS — E669 Obesity, unspecified: Secondary | ICD-10-CM

## 2016-07-03 DIAGNOSIS — Z7901 Long term (current) use of anticoagulants: Secondary | ICD-10-CM

## 2016-07-03 DIAGNOSIS — I2510 Atherosclerotic heart disease of native coronary artery without angina pectoris: Secondary | ICD-10-CM | POA: Diagnosis not present

## 2016-07-03 DIAGNOSIS — G4730 Sleep apnea, unspecified: Secondary | ICD-10-CM

## 2016-07-03 NOTE — Progress Notes (Signed)
Seth Hardy OFFICE PROGRESS NOTE  Patient Care Team: Rusty Aus, MD as PCP - General (Internal Medicine)   SUMMARY OF ONCOLOGIC HISTORY:  # MARCH 2017- April 2017- IRON DEFICIENCY ANEMIA ? Etiology s/p IV iron [EGD- ?June 2017- Bleeding gastric "polyp"/ colo- Dr.Gessner]  # hx of A.fib- on coumadin-HOLD April 2017/ CAD [Dr.[name]]; Hx of gastric ulcer [Dr.Gessner];   TIA/ CEA [s/p April 2017]  History of present illness:   71 -year-old Caucasian male patient is here for follow-up of his severe iron deficiency anemia- status post iron deficiency; also on Coumadin for A. fib is here for follow-up.  Denies any fatigue. Appetite is good. Denies any nausea vomiting. No abdominal pain. He is taking iron pills. He last received IV iron in November 2017- when his hemoglobin was down to 10.7. Currently his energy levels are good.  He continues to deny any blood in stools black stools. Appetite is good. No chest pain or shortness of breath or cough.    REVIEW OF SYSTEMS:  A complete 10 point review of system is done which is negative except mentioned above/history of present illness.   PAST MEDICAL HISTORY :  Past Medical History  Diagnosis Date  . Hypertensive heart disease   . Hyperlipidemia   . Diabetes mellitus type II, controlled ([place])     a. Variable CBG 02/2012 - several meds adjusted.  . Mild aortic stenosis     a. 03/2011 Echo: EF 55-60%, Mild AS. b. Mild by cath 02/2012; c. 01/2014 Echo: EF 65-70%, Gr 1 DD, mild AS, mildly dil LA.  . Obesity   . GERD (gastroesophageal reflux disease)   . PAF (paroxysmal atrial fibrillation) ([place])     a. Newly dx 02/2012 & initiated on Coumadin (spont converted to NSR).  Marland Kitchen CAD (coronary artery disease)     a. 01/2010 CABG x 4: LIMA->LAD, VG->D1, VG->OM1, VG->PDA. b. NSTEMI 02/2012 in setting of AF-RVR with cath s/p BMS to RCA (plan for 1 month, possibly up to 3 months of Plavix)  . Carotid disease, bilateral ([place])     a. 03/2011  U/S: 40-59% bilat Carotid dzs;  b. 04/2015 Carotid U/S: 40-59% bilat ICA stenosis; c. 05/2015 CTA Neck: [id number] RICA, [id number] LICA, mod-marked R vertebral stenosis, mod L vertebral stenosis.  . Myocardial infarction ([place]) 2014  . Hypertension   . Shortness of breath dyspnea   . Sleep apnea     CPAP with oxygen  . Chronic kidney disease   . Basal cell carcinoma of skin   . Anemia   . Macular edema bil    PAST SURGICAL HISTORY :   Past Surgical History  Procedure Laterality Date  . Cyst l kidney  [place]  . Angioplasty    . Cardiac catheterization    . Left heart catheterization with coronary angiogram N/A 03/09/2012    Procedure: LEFT HEART CATHETERIZATION WITH CORONARY ANGIOGRAM;  Surgeon: Wellington Hampshire, MD;  Location: [facility] CATH LAB;  Service: Cardiovascular;  Laterality: N/A;  . Percutaneous coronary stent intervention (pci-s)  03/09/2012    Procedure: PERCUTANEOUS CORONARY STENT INTERVENTION (PCI-S);  Surgeon: Wellington Hampshire, MD;  Location: [facility] CATH LAB;  Service: Cardiovascular;;  . Coronary artery bypass graft  2011    [facility], [name], [place],   . Coronary angioplasty      Dr. Fletcher Anon  . Cataract extraction Bilateral     Cataract Extraction with IOL  . Inguinal hernia repair Right  Right Inguinal Hernia Repair    FAMILY HISTORY :   Family History  Problem Relation Age of Onset  . COPD Mother     alive 40  . Heart attack Father     08-26-2013 deceased  . Bladder Cancer Mother   . Lung cancer Maternal Aunt   . Liver cancer Maternal Aunt   . Kidney disease Paternal Uncle   . Cancer Other     all paternal aunts and uncles    SOCIAL HISTORY:   Social History  Substance Use Topics  . Smoking status: Never Smoker   . Smokeless tobacco: Never Used     Comment: tobacco use - no  . Alcohol Use: Yes     Comment: rare drink    ALLERGIES:  has No Known Allergies.  MEDICATIONS:  Current Outpatient Prescriptions  Medication Sig Dispense Refill  . acetaminophen  (TYLENOL) 325 MG tablet Take 650 mg by mouth every 6 (six) hours as needed. For pain    . amiodarone (PACERONE) 200 MG tablet Take 0.5 tablets (100 mg total) by mouth daily. (Patient taking differently: Take 100 mg by mouth at bedtime. ) 45 tablet 3  . amLODipine (NORVASC) 5 MG tablet Take one tablet by mouth one time daily 90 tablet 3  . aspirin 81 MG EC tablet Take 81 mg by mouth daily.      Marland Kitchen atorvastatin (LIPITOR) 20 MG tablet Take 1 tablet (20 mg total) by mouth at bedtime. 90 tablet 3  . furosemide (LASIX) 20 MG tablet Take 1 tablet (20 mg total) by mouth as needed. 30 tablet 6  . glimepiride (AMARYL) 4 MG tablet Take 4 mg by mouth 2 (two) times daily.      . insulin glargine (LANTUS) 100 UNIT/ML injection Inject 20 Units into the skin daily.     . insulin lispro (HUMALOG) 100 UNIT/ML injection Inject 8 Units into the skin 2 (two) times daily with a meal. (Patient taking differently: Inject into the skin 3 (three) times daily. Per sliding scale.)    . metFORMIN (GLUCOPHAGE) 500 MG tablet Take 500 mg by mouth 2 (two) times daily with a meal.    . nitroGLYCERIN (NITROSTAT) 0.4 MG SL tablet Place 1 tablet (0.4 mg total) under the tongue every 5 (five) minutes as needed (up to 3 doses). 25 tablet 1  . OXYGEN Inhale 2.5 L/min into the lungs at bedtime. With CPAP    . pantoprazole (PROTONIX) 40 MG tablet Take 1 tablet (40 mg total) by mouth daily. (Patient taking differently: Take 40 mg by mouth 2 (two) times daily. ) 90 tablet 3  . ramipril (ALTACE) 10 MG capsule Take 1 capsule (10 mg total) by mouth daily. 90 capsule 5  . warfarin (COUMADIN) 5 MG tablet Take as directed by anticoagulation clinic 90 tablet 1  . IRON-VITAMIN C PO Take 1 tablet by mouth daily. Reported on 08/27/2015    . Omega-3 Fatty Acids (FISH OIL) 1200 MG CAPS Take 1 capsule by mouth daily. Reported on 08/27/2015     No current facility-administered medications for this visit.    PHYSICAL EXAMINATION:   BP 158/71 mmHg  Pulse  51  Temp(Src) 96.1 F (35.6 C)  Resp 18  Wt 192 lb 0.3 oz (87.1 kg)  Filed Weights   08/27/15 1015  Weight: 192 lb 0.3 oz (87.1 kg)    GENERAL: Well-nourished well-developed; Alert, no distress and comfortable. Alone.  EYES: positive for pallor OROPHARYNX: no thrush or ulceration; good dentition  NECK: supple, no masses felt LYMPH:  no palpable lymphadenopathy in the cervical, axillary or inguinal regions LUNGS: clear to auscultation and  No wheeze or crackles HEART/CVS: regular rate & rhythm and no murmurs; No lower extremity edema ABDOMEN:abdomen soft, non-tender and normal bowel sounds Musculoskeletal:no cyanosis of digits and no clubbing  PSYCH: alert & oriented x 3 with fluent speech NEURO: no focal motor/sensory deficits SKIN:  no rashes or significant lesions  LABORATORY DATA:  I have reviewed the data as listed    Component Value Date/Time   NA 139 07/25/2015 1138   NA 139 01/22/2013 0348   K 4.1 07/25/2015 1138   K 4.2 01/22/2013 0348   CL 103 07/25/2015 1138   CL 106 01/22/2013 0348   CO2 27 07/25/2015 1138   CO2 28 01/22/2013 0348   GLUCOSE 216* 07/25/2015 1138   GLUCOSE 158* 01/22/2013 0348   BUN 14 07/25/2015 1138   BUN 16 01/22/2013 0348   CREATININE 1.03 07/25/2015 1138   CREATININE 1.02 01/22/2013 0348   CALCIUM 9.1 07/25/2015 1138   CALCIUM 8.6 01/22/2013 0348   PROT 7.2 06/25/2015 1045   PROT 7.5 01/21/2013 0209   ALBUMIN 4.1 06/25/2015 1045   ALBUMIN 4.3 01/21/2013 0209   AST 30 06/25/2015 1045   AST 28 01/21/2013 0209   ALT 33 06/25/2015 1045   ALT 36 01/21/2013 0209   ALKPHOS 104 06/25/2015 1045   ALKPHOS 143* 01/21/2013 0209   BILITOT 0.4 06/25/2015 1045   BILITOT 0.4 01/21/2013 0209   GFRNONAA >60 07/25/2015 1138   GFRNONAA >60 01/22/2013 0348   GFRAA >60 07/25/2015 1138   GFRAA >60 01/22/2013 0348    No results found for: SPEP, UPEP  Lab Results  Component Value Date   WBC 4.5 08/22/2015   NEUTROABS 2.5 08/22/2015   HGB 8.0  Repeated and verified X2.* 08/22/2015   HCT 26.2 Repeated and verified X2.* 08/22/2015   MCV 67.4 Repeated and verified X2.* 08/22/2015   PLT 264.0 08/22/2015      Chemistry      Component Value Date/Time   NA 139 07/25/2015 1138   NA 139 01/22/2013 0348   K 4.1 07/25/2015 1138   K 4.2 01/22/2013 0348   CL 103 07/25/2015 1138   CL 106 01/22/2013 0348   CO2 27 07/25/2015 1138   CO2 28 01/22/2013 0348   BUN 14 07/25/2015 1138   BUN 16 01/22/2013 0348   CREATININE 1.03 07/25/2015 1138   CREATININE 1.02 01/22/2013 0348      Component Value Date/Time   CALCIUM 9.1 07/25/2015 1138   CALCIUM 8.6 01/22/2013 0348   ALKPHOS 104 06/25/2015 1045   ALKPHOS 143* 01/21/2013 0209   AST 30 06/25/2015 1045   AST 28 01/21/2013 0209   ALT 33 06/25/2015 1045   ALT 36 01/21/2013 0209   BILITOT 0.4 06/25/2015 1045   BILITOT 0.4 01/21/2013 0209        ASSESSMENT & PLAN:   Iron deficiency anemia due to chronic blood loss # Severe anemia- iron deficient- likely from stomach blood loss. Status post IV iron Feraheme in Nov 2017; hemoglobin today is 12.3.  Pt inclines toward PO iron. Will hold off IV iron.   # History of A. Fib- Coumadin as per cards.   # Recommend follow-up in 3 months with CBC and studies and ferritin possible Feraheme if needed. He'll have labs done a week prior.

## 2016-07-03 NOTE — Progress Notes (Signed)
Patient here today for follow up.  Patient states no new concerns today  

## 2016-07-03 NOTE — Assessment & Plan Note (Signed)
#   Severe anemia- iron deficient- likely from stomach blood loss. Status post IV iron Feraheme in Nov 2017; hemoglobin today is 12.3.  Pt inclines toward PO iron. Will hold off IV iron.   # History of A. Fib- Coumadin as per cards.   # Recommend follow-up in 3 months with CBC and studies and ferritin possible Feraheme if needed. He'll have labs done a week prior.

## 2016-07-15 ENCOUNTER — Ambulatory Visit (INDEPENDENT_AMBULATORY_CARE_PROVIDER_SITE_OTHER): Payer: PPO

## 2016-07-15 DIAGNOSIS — Z7901 Long term (current) use of anticoagulants: Secondary | ICD-10-CM | POA: Diagnosis not present

## 2016-07-15 DIAGNOSIS — I4891 Unspecified atrial fibrillation: Secondary | ICD-10-CM

## 2016-07-15 LAB — POCT INR: INR: 3.1

## 2016-07-17 ENCOUNTER — Other Ambulatory Visit: Payer: Self-pay | Admitting: Internal Medicine

## 2016-07-17 DIAGNOSIS — I4891 Unspecified atrial fibrillation: Secondary | ICD-10-CM

## 2016-07-17 NOTE — Telephone Encounter (Signed)
Please advise Sir, thank you. 

## 2016-07-20 NOTE — Telephone Encounter (Signed)
Dr. Jenna Luo his cardiologist should be asked about this

## 2016-07-21 ENCOUNTER — Other Ambulatory Visit: Payer: Self-pay | Admitting: Cardiovascular Disease

## 2016-07-21 MED ORDER — AMIODARONE HCL 200 MG PO TABS: 100.0000 mg | ORAL_TABLET | Freq: Every day | ORAL | 1 refills | Status: DC

## 2016-07-29 DIAGNOSIS — H35371 Puckering of macula, right eye: Secondary | ICD-10-CM | POA: Diagnosis not present

## 2016-07-29 DIAGNOSIS — E113513 Type 2 diabetes mellitus with proliferative diabetic retinopathy with macular edema, bilateral: Secondary | ICD-10-CM | POA: Diagnosis not present

## 2016-07-29 DIAGNOSIS — D3132 Benign neoplasm of left choroid: Secondary | ICD-10-CM | POA: Diagnosis not present

## 2016-08-20 DIAGNOSIS — E538 Deficiency of other specified B group vitamins: Secondary | ICD-10-CM | POA: Diagnosis not present

## 2016-08-20 DIAGNOSIS — Z9989 Dependence on other enabling machines and devices: Secondary | ICD-10-CM | POA: Diagnosis not present

## 2016-08-20 DIAGNOSIS — E1165 Type 2 diabetes mellitus with hyperglycemia: Secondary | ICD-10-CM | POA: Diagnosis not present

## 2016-08-20 DIAGNOSIS — E11311 Type 2 diabetes mellitus with unspecified diabetic retinopathy with macular edema: Secondary | ICD-10-CM | POA: Diagnosis not present

## 2016-08-20 DIAGNOSIS — G4733 Obstructive sleep apnea (adult) (pediatric): Secondary | ICD-10-CM | POA: Diagnosis not present

## 2016-08-20 DIAGNOSIS — Z0000 Encounter for general adult medical examination without abnormal findings: Secondary | ICD-10-CM | POA: Diagnosis not present

## 2016-08-26 ENCOUNTER — Ambulatory Visit (INDEPENDENT_AMBULATORY_CARE_PROVIDER_SITE_OTHER): Payer: PPO

## 2016-08-26 DIAGNOSIS — Z7901 Long term (current) use of anticoagulants: Secondary | ICD-10-CM

## 2016-08-26 DIAGNOSIS — I4891 Unspecified atrial fibrillation: Secondary | ICD-10-CM

## 2016-08-26 LAB — POCT INR: INR: 1.8

## 2016-09-07 DIAGNOSIS — H52203 Unspecified astigmatism, bilateral: Secondary | ICD-10-CM | POA: Diagnosis not present

## 2016-09-07 DIAGNOSIS — Z961 Presence of intraocular lens: Secondary | ICD-10-CM | POA: Diagnosis not present

## 2016-09-07 DIAGNOSIS — E113511 Type 2 diabetes mellitus with proliferative diabetic retinopathy with macular edema, right eye: Secondary | ICD-10-CM | POA: Diagnosis not present

## 2016-09-07 DIAGNOSIS — E113592 Type 2 diabetes mellitus with proliferative diabetic retinopathy without macular edema, left eye: Secondary | ICD-10-CM | POA: Diagnosis not present

## 2016-09-23 ENCOUNTER — Inpatient Hospital Stay: Payer: PPO

## 2016-09-23 ENCOUNTER — Telehealth: Payer: Self-pay | Admitting: *Deleted

## 2016-09-23 NOTE — Telephone Encounter (Signed)
Patient came for lab only apt today. RN spoke with patient. He asked RN to review his labs that he had drawn at [facility] on 08/20/16.  MD also reviewed labs. apts today and next week are not needed. V/o obtained from Dr. Rogue Bussing -  1.  cnl lab only apt today. not needed - r/s in 3 months and 2. cnl md/iv iron apts next week 5/9-- r/s in 3 months. Pt agreeable.

## 2016-09-30 ENCOUNTER — Ambulatory Visit: Payer: Self-pay | Admitting: Internal Medicine

## 2016-09-30 ENCOUNTER — Ambulatory Visit: Payer: Self-pay

## 2016-09-30 ENCOUNTER — Ambulatory Visit (INDEPENDENT_AMBULATORY_CARE_PROVIDER_SITE_OTHER): Payer: PPO | Admitting: *Deleted

## 2016-09-30 DIAGNOSIS — Z7901 Long term (current) use of anticoagulants: Secondary | ICD-10-CM

## 2016-09-30 DIAGNOSIS — I4891 Unspecified atrial fibrillation: Secondary | ICD-10-CM | POA: Diagnosis not present

## 2016-09-30 LAB — POCT INR: INR: 2.7

## 2016-10-26 IMAGING — US US RENAL
1 series · 14 of 25 positions shown · non-contrast
Comparison: CT scan 08/20/2015

CLINICAL DATA: Followup renal lesions seen on recent CT scan.

EXAM:
RENAL / URINARY TRACT ULTRASOUND COMPLETE

[Series 1: us renal · 0.26mm/px · 14 of 34 slices shown]
[im 1/34]
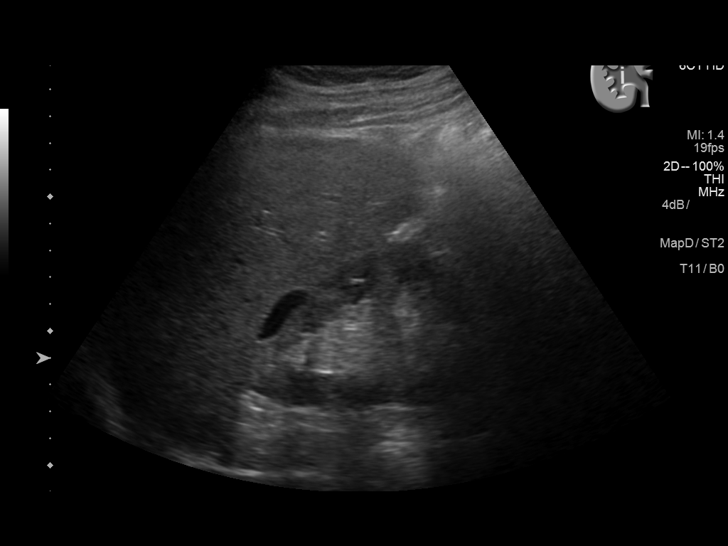
[im 3/34]
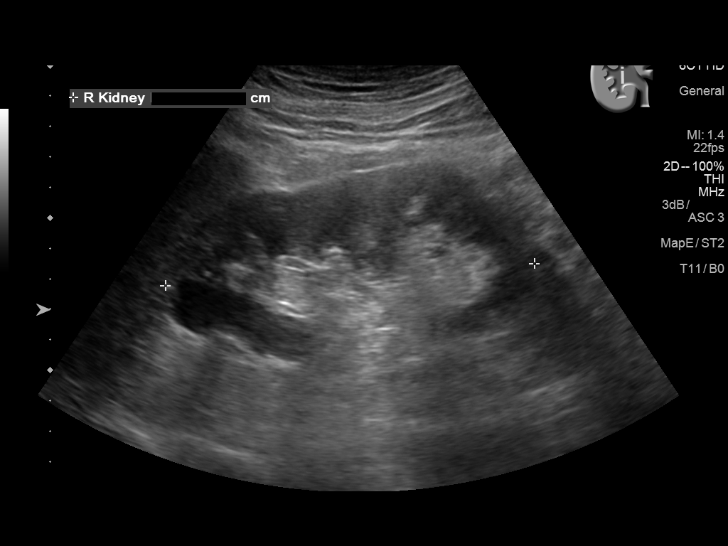
[im 6/34]
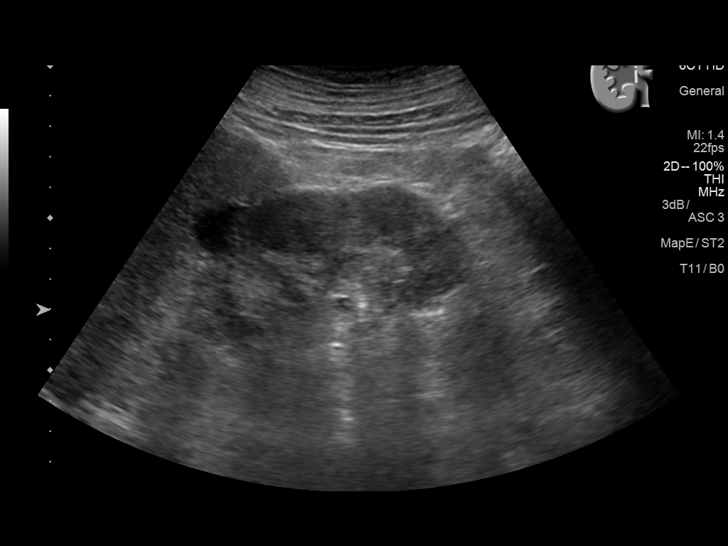
[im 9/34]
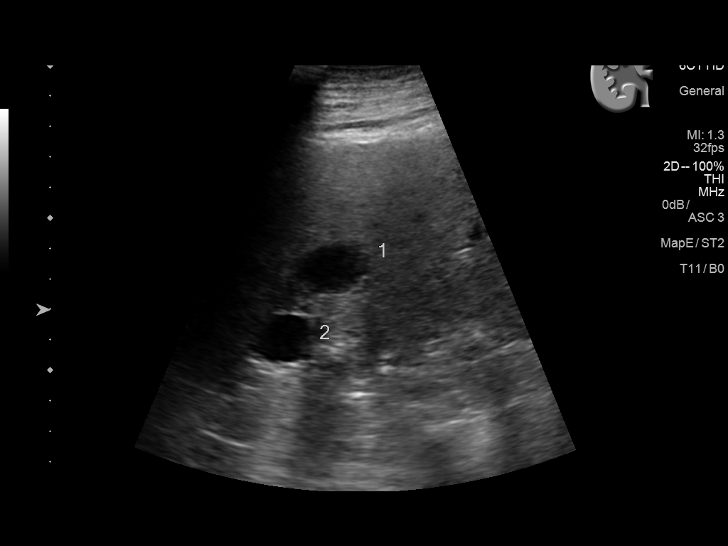
[im 12/34]
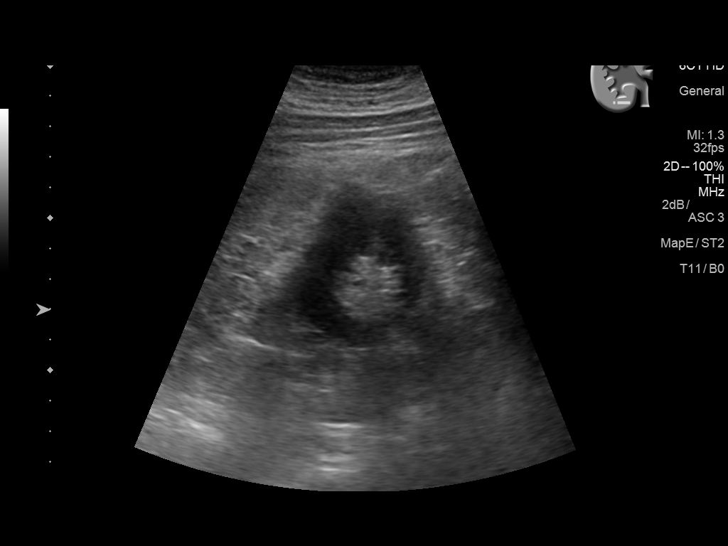
[im 13/34]
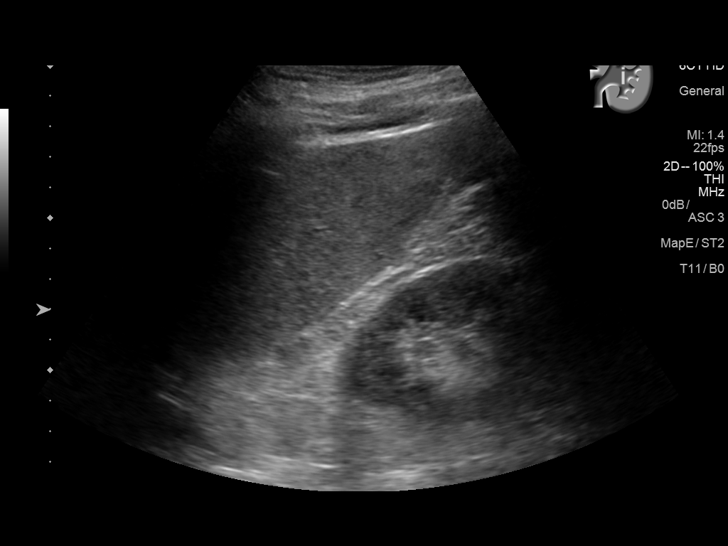
[im 16/34]
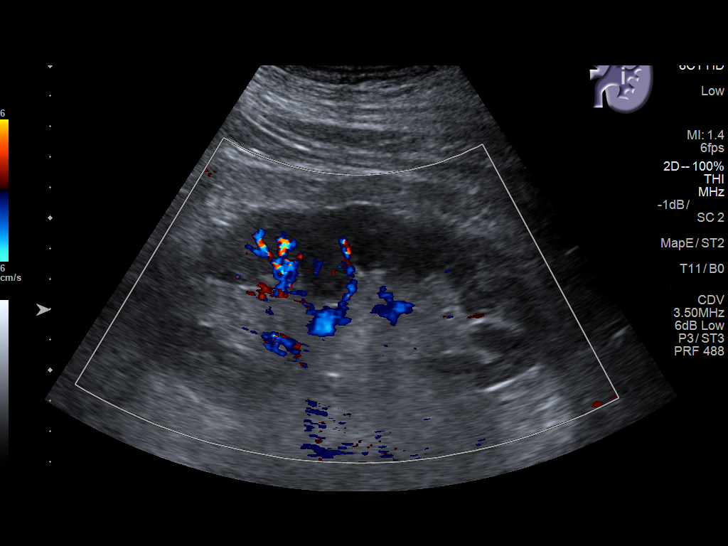
[im 18/34]
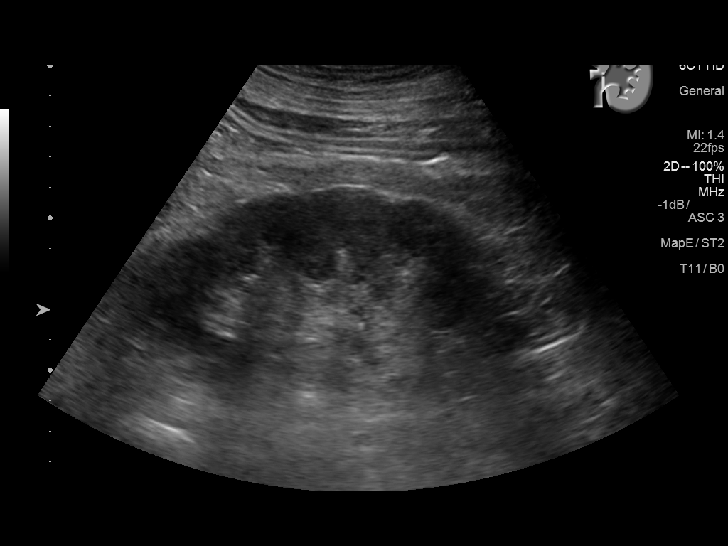
[im 21/34]
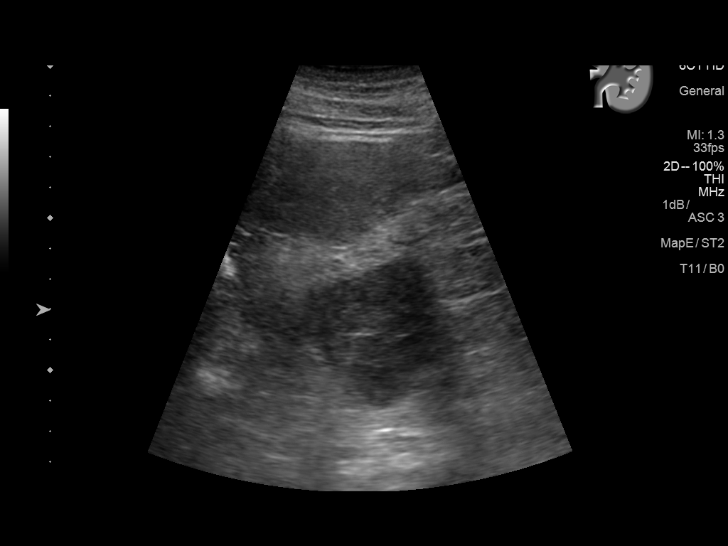
[im 23/34]
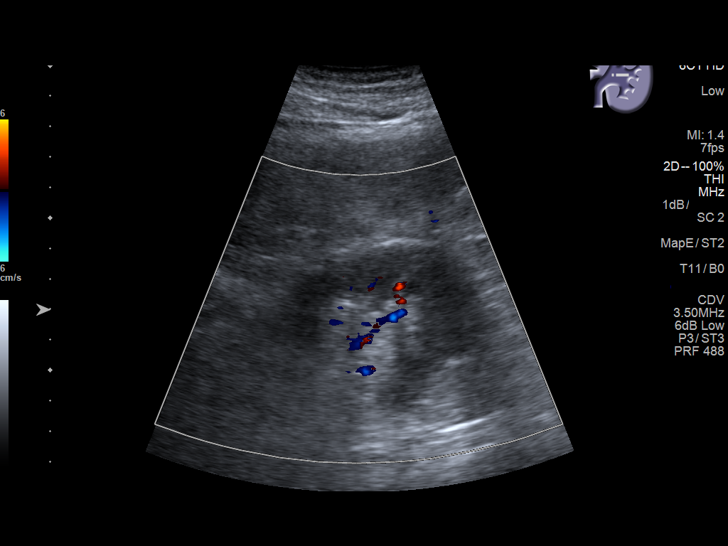
[im 25/34]
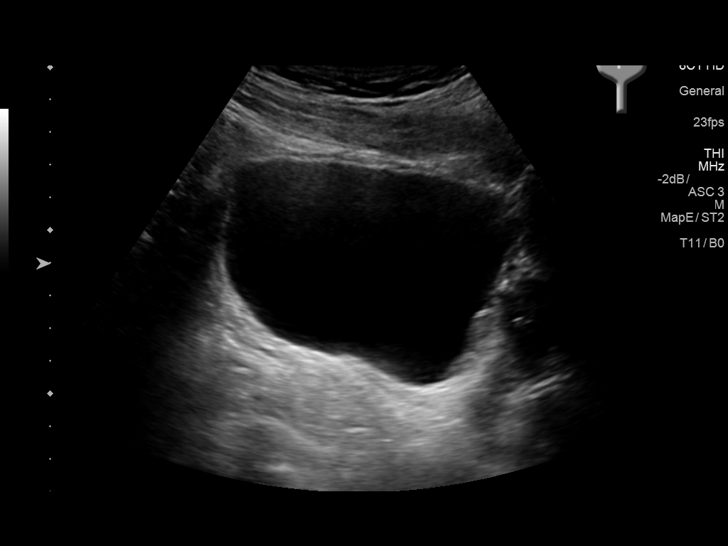
[im 28/34]
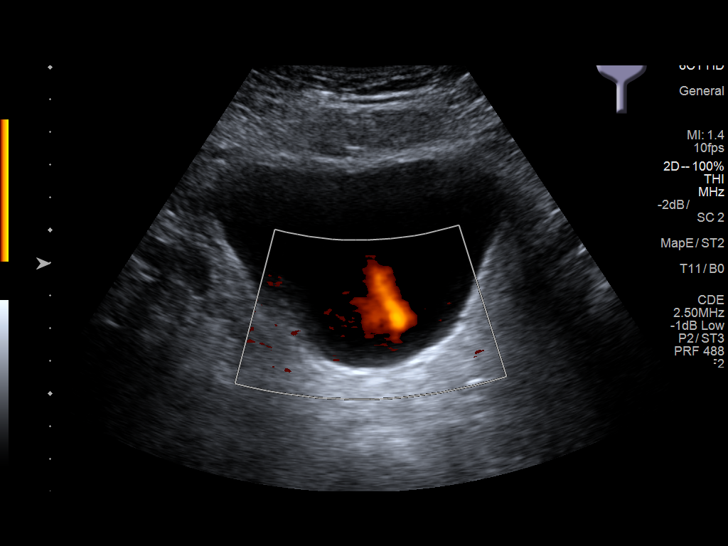
[im 31/34]
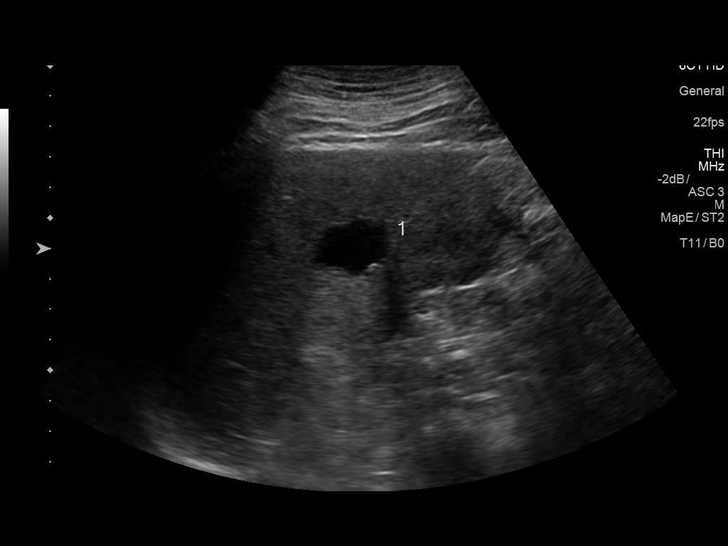
[im 34/34]
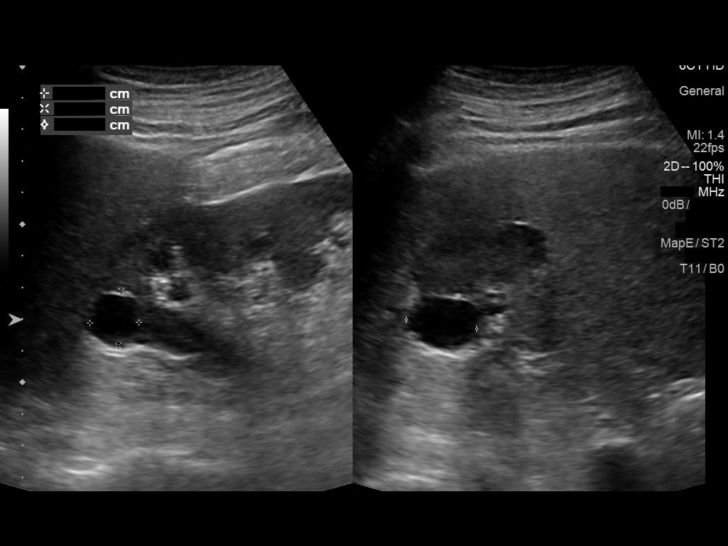

[14 of 25 positions shown; findings below may reference images not displayed]

FINDINGS: Right Kidney:

Length: 12.1 cm. Normal renal cortical thickness and echogenicity
without hydronephrosis. There are 2 upper pole simple cysts as seen
on the CT scan. These measure 2.1 x 1.9 x 2.7 cm and 1.6 x 1.7 x
cm.

Left Kidney:

Length: 12.9 cm. Normal renal cortical thickness and echogenicity
without focal lesions or hydronephrosis.

Bladder:

Normal.  Bilateral ureteral jets are noted.
IMPRESSION: Two right upper pole simple appearing renal cysts. No worrisome
renal lesions or hydronephrosis.

## 2016-10-27 ENCOUNTER — Ambulatory Visit (INDEPENDENT_AMBULATORY_CARE_PROVIDER_SITE_OTHER): Payer: PPO | Admitting: Cardiovascular Disease

## 2016-10-27 ENCOUNTER — Encounter: Payer: Self-pay | Admitting: Cardiovascular Disease

## 2016-10-27 VITALS — BP 128/56 | HR 52 | Ht 68.0 in | Wt 190.8 lb

## 2016-10-27 DIAGNOSIS — I481 Persistent atrial fibrillation: Secondary | ICD-10-CM

## 2016-10-27 DIAGNOSIS — I2510 Atherosclerotic heart disease of native coronary artery without angina pectoris: Secondary | ICD-10-CM | POA: Diagnosis not present

## 2016-10-27 DIAGNOSIS — I779 Disorder of arteries and arterioles, unspecified: Secondary | ICD-10-CM | POA: Diagnosis not present

## 2016-10-27 DIAGNOSIS — I739 Peripheral vascular disease, unspecified: Secondary | ICD-10-CM

## 2016-10-27 DIAGNOSIS — E782 Mixed hyperlipidemia: Secondary | ICD-10-CM | POA: Diagnosis not present

## 2016-10-27 DIAGNOSIS — I350 Nonrheumatic aortic (valve) stenosis: Secondary | ICD-10-CM

## 2016-10-27 DIAGNOSIS — I4819 Other persistent atrial fibrillation: Secondary | ICD-10-CM

## 2016-10-27 NOTE — Patient Instructions (Addendum)
Medication Instructions:  Your physician recommends that you continue on your current medications as directed. Please refer to the Current Medication list given to you today.   Labwork: none  Testing/Procedures: Your physician has requested that you have an echocardiogram. Echocardiography is a painless test that uses sound waves to create images of your heart. It provides your doctor with information about the size and shape of your heart and how well your heart's chambers and valves are working. This procedure takes approximately one hour. There are no restrictions for this procedure.    Follow-Up: Your physician wants you to follow-up in: 6 months with Dr. [name].  You will receive a reminder letter in the mail two months in advance. If you don't receive a letter, please call our office to schedule the follow-up appointment.   Any Other Special Instructions Will Be Listed Below (If Applicable).     If you need a refill on your cardiac medications before your next appointment, please call your pharmacy.  Echocardiogram An echocardiogram, or echocardiography, uses sound waves (ultrasound) to produce an image of your heart. The echocardiogram is simple, painless, obtained within a short period of time, and offers valuable information to your health care provider. The images from an echocardiogram can provide information such as:  Evidence of coronary artery disease (CAD).  Heart size.  Heart muscle function.  Heart valve function.  Aneurysm detection.  Evidence of a past heart attack.  Fluid buildup around the heart.  Heart muscle thickening.  Assess heart valve function.  Tell a health care provider about:  Any allergies you have.  All medicines you are taking, including vitamins, herbs, eye drops, creams, and over-the-counter medicines.  Any problems you or family members have had with anesthetic medicines.  Any blood disorders you have.  Any surgeries you have  had.  Any medical conditions you have.  Whether you are pregnant or may be pregnant. What happens before the procedure? No special preparation is needed. Eat and drink normally. What happens during the procedure?  In order to produce an image of your heart, gel will be applied to your chest and a wand-like tool (transducer) will be moved over your chest. The gel will help transmit the sound waves from the transducer. The sound waves will harmlessly bounce off your heart to allow the heart images to be captured in real-time motion. These images will then be recorded.  You may need an IV to receive a medicine that improves the quality of the pictures. What happens after the procedure? You may return to your normal schedule including diet, activities, and medicines, unless your health care provider tells you otherwise. This information is not intended to replace advice given to you by your health care provider. Make sure you discuss any questions you have with your health care provider. Document Released: 05/08/2000 Document Revised: 12/28/2015 Document Reviewed: 01/16/2013 Elsevier Interactive Patient Education  2017 [employer]  

## 2016-10-27 NOTE — Progress Notes (Signed)
Cardiology Office Note   Date:  10/27/2016   ID:  Seth Hardy, DOB 1945/10/19, MRN [id number]  PCP:  Rusty Aus, MD  Cardiologist:   Kathlyn Sacramento, MD   Chief Complaint  Patient presents with  . other    6 month follow up. Meds reviewed by the pt. verbally. "doing well."       History of Present Illness: Seth Hardy is a 71 y.o. male who Is here today for a follow-up visit . He has a history of coronary artery disease and is status post bypass grafting in September 2011 with subsequent bare metal stenting of the right coronary artery in 2013. He also has a history of persistent atrial fibrillation maintained in sinus rhythm on small dose amiodarone and is chronically anticoagulated on Coumadin. He has other chronic medical conditions that include aortic stenosis, hypertension, hyperlipidemia, bilateral carotid arterial disease status post left carotid endarterectomy in April 2017, and diabetes.   Most recent echocardiogram was done at [facility] in 05/2015 which showed normal LV systolic function with mild to moderate aortic stenosis. Aortic valve mean gradient was 18 mmHg.  He has known history of iron deficiency anemia due to gastric polyp which was resected.  This has improved with iron infusion. He has been doing well overall with no chest pain, shortness of breath or palpitations. He did have worsening leg edema after recent trip to the beach. He has been taking his medications regularly.  Past Medical History:  Diagnosis Date  . Anemia 08/2015   received 2 units rbc one week ago, 3 IV iron infusion -last 1 week.  . Basal cell carcinoma of skin 2012   removed several spots from arms and back  . CAD (coronary artery disease)    a. 01/2010 CABG x 4: LIMA->LAD, VG->D1, VG->OM1, VG->PDA. b. NSTEMI 02/2012 in setting of AF-RVR with cath s/p BMS to RCA (plan for 1 month, possibly up to 3 months of Plavix)  . Carotid disease, bilateral ([place])    a. 03/2011 U/S: 40-59% bilat  Carotid dzs;  b. 04/2015 Carotid U/S: 40-59% bilat ICA stenosis; c. 05/2015 CTA Neck: 18% RICA, 84% LICA, mod-marked R vertebral stenosis, mod L vertebral stenosis.  . Chronic kidney disease    small obtusion per pcp. ultrasound done on 08/29/2015  . Diabetes mellitus type II, controlled ([place])    a. Variable CBG 02/2012 - several meds adjusted.  Marland Kitchen Dysrhythmia    intermittent Atrial Fibrillation  . GERD (gastroesophageal reflux disease)   . Hyperlipidemia   . Hypertension   . Hypertensive heart disease   . Macular edema bil   lazer work done previously  . Mild aortic stenosis    a. 03/2011 Echo: EF 55-60%, Mild AS. b. Mild by cath 02/2012; c. 01/2014 Echo: EF 65-70%, Gr 1 DD, mild AS, mildly dil LA.  . Multiple gastric ulcers 2006  . Myocardial infarction [facility]) 2014   stents (x1) at time  . Obesity   . PAF (paroxysmal atrial fibrillation) ([place])    a. Newly dx 02/2012 & initiated on Coumadin (spont converted to NSR).  . Shortness of breath dyspnea   . Sleep apnea    CPAP settings 3 with oxygen 2.5  . Transfusion history    transfusions -1 month ago -2 units    Past Surgical History:  Procedure Laterality Date  . ANGIOPLASTY    . CARDIAC CATHETERIZATION  2014  . CATARACT EXTRACTION Bilateral 2016   Cataract Extraction with IOL  .  COLONOSCOPY WITH PROPOFOL N/A 10/15/2015   Procedure: COLONOSCOPY WITH PROPOFOL;  Surgeon: Gatha Mayer, MD;  Location: [facility] ENDOSCOPY;  Service: Endoscopy;  Laterality: N/A;  . CORONARY ANGIOPLASTY     Dr. Fletcher Anon  . CORONARY ARTERY BYPASS GRAFT  2011   [facility], [place], [place],   . cyst L kidney  [place]  . ENDARTERECTOMY Left 08/30/2015   Procedure: ENDARTERECTOMY CAROTID;  Surgeon: Katha Cabal, MD;  Location: [facility] ORS;  Service: Vascular;  Laterality: Left;  . ESOPHAGOGASTRODUODENOSCOPY (EGD) WITH PROPOFOL N/A 10/15/2015   Procedure: ESOPHAGOGASTRODUODENOSCOPY (EGD) WITH PROPOFOL;  Surgeon: Gatha Mayer, MD;  Location: [facility] ENDOSCOPY;   Service: Endoscopy;  Laterality: N/A;  . INGUINAL HERNIA REPAIR Right    Right Inguinal Hernia Repair  . LEFT HEART CATHETERIZATION WITH CORONARY ANGIOGRAM N/A 03/09/2012   Procedure: LEFT HEART CATHETERIZATION WITH CORONARY ANGIOGRAM;  Surgeon: Wellington Hampshire, MD;  Location: [facility] CATH LAB;  Service: Cardiovascular;  Laterality: N/A;  . PERCUTANEOUS CORONARY STENT INTERVENTION (PCI-S)  03/09/2012   Procedure: PERCUTANEOUS CORONARY STENT INTERVENTION (PCI-S);  Surgeon: Wellington Hampshire, MD;  Location: [facility] CATH LAB;  Service: Cardiovascular;;     Current Outpatient Prescriptions  Medication Sig Dispense Refill  . acetaminophen (TYLENOL) 325 MG tablet Take 650 mg by mouth every 6 (six) hours as needed. For pain    . amiodarone (PACERONE) 200 MG tablet Take 0.5 tablets (100 mg total) by mouth at bedtime. 45 tablet 1  . amLODipine (NORVASC) 5 MG tablet Take one tablet by mouth one time daily (Patient taking differently: Take 5 mg by mouth daily. ) 90 tablet 3  . atorvastatin (LIPITOR) 20 MG tablet TAKE ONE TABLET BY MOUTH AT BEDTIME 90 tablet 3  . Cyanocobalamin (B-12) 1000 MCG CAPS Take 1,000 mcg by mouth daily.     . furosemide (LASIX) 20 MG tablet Take 1 tablet (20 mg total) by mouth as needed for fluid or edema. 30 tablet 6  . glimepiride (AMARYL) 4 MG tablet Take 4 mg by mouth 2 (two) times daily.      . insulin glargine (LANTUS) 100 UNIT/ML injection Inject 20 Units into the skin daily.     . insulin lispro (HUMALOG) 100 UNIT/ML injection Per sliding scale. 10 mL 11  . metFORMIN (GLUCOPHAGE) 500 MG tablet Take 500 mg by mouth 2 (two) times daily with a meal.     . nitroGLYCERIN (NITROSTAT) 0.4 MG SL tablet Place 1 tablet (0.4 mg total) under the tongue every 5 (five) minutes as needed (up to 3 doses). (Patient taking differently: Place 0.4 mg under the tongue every 5 (five) minutes as needed for chest pain (up to 3 doses). ) 25 tablet 1  . Omega-3 Fatty Acids (FISH OIL) 1200 MG CAPS Take 1,200  mg by mouth daily. Reported on 08/27/2015    . OXYGEN Inhale 2.5 L/min into the lungs at bedtime. With CPAP    . pantoprazole (PROTONIX) 40 MG tablet Take 1 tablet (40 mg total) by mouth daily. 90 tablet 3  . ramipril (ALTACE) 10 MG capsule Take 10 mg by mouth daily.     . vitamin C (ASCORBIC ACID) 500 MG tablet Take 500 mg by mouth daily.     No current facility-administered medications for this visit.     Allergies:   Patient has no known allergies.    Social History:  The patient  reports that he has never smoked. He has never used smokeless tobacco. He reports  that he drinks alcohol. He reports that he does not use drugs.   Family History:  [name] family history includes Bladder Cancer in his mother; COPD in his mother; Cancer in his other; Heart attack in his father; Kidney disease in his paternal uncle; Liver cancer in his maternal aunt; Lung cancer in his maternal aunt.    ROS:  Please see the history of present illness.   Otherwise, review of systems are positive for none.   All other systems are reviewed and negative.    PHYSICAL EXAM: VS:  BP (!) 128/56 (BP Location: Left Arm, Patient Position: Sitting, Cuff Size: Normal)   Pulse (!) 52   Ht 5\' 8"  (1.727 m)   Wt 190 lb 12 oz (86.5 kg)   BMI 29.00 kg/m  , BMI Body mass index is 29 kg/m. GEN: Well nourished, well developed, in no acute distress  HEENT: normal  Neck: no JVD, carotid bruits, or masses Cardiac: RRR; no rubs, or gallops, mild bilateral leg edema . There is a 3/6 crescendo decrescendo systolic murmur in the aortic area which is mid peaking. The murmur radiates to the carotid arteries. Respiratory:  clear to auscultation bilaterally, normal work of breathing GI: soft, nontender, nondistended, + BS MS: no deformity or atrophy  Skin: warm and dry, no rash Neuro:  Strength and sensation are intact Psych: euthymic mood, full affect   EKG:  EKG  ordered today. EKG showed sinus bradycardia with anterolateral  T wave changes suggestive of ischemia.   Recent Labs: 06/30/2016: Hemoglobin 12.6; Platelets 191    Lipid Panel    Component Value Date/Time   CHOL 99 03/09/2012 0243   TRIG 84 03/09/2012 0243   HDL 37 (L) 03/09/2012 0243   CHOLHDL 2.7 03/09/2012 0243   VLDL 17 03/09/2012 0243   LDLCALC 45 03/09/2012 0243      Wt Readings from Last 3 Encounters:  10/27/16 190 lb 12 oz (86.5 kg)  07/03/16 192 lb 4 oz (87.2 kg)  04/27/16 194 lb 8 oz (88.2 kg)        ASSESSMENT AND PLAN:  1.  Coronary artery disease involving bypass graft without angina:  He is doing well overall with no anginal symptoms. Continue medical therapy.  2. Bilateral carotid artery stenosis: Status post  left carotid endarterectomy. Followed by Dr. Delana Meyer.  3. Moderate aortic stenosis: I requested a repeat echocardiogram to be done in the next few weeks.  4. Persistent atrial fibrillation: Maintaining in sinus rhythm with small dose amiodarone.    Continue anticoagulation with warfarin. I reviewed his labs from March which showed normal liver profile.  5. Hyperlipidemia: Continue treatment with atorvastatin . I reviewed lipid profile in  March which showed an LDL of 57.  Disposition:   FU with me in 6 months  Signed,  Kathlyn Sacramento, MD  10/27/2016 3:53 PM    [place]

## 2016-11-11 ENCOUNTER — Ambulatory Visit (INDEPENDENT_AMBULATORY_CARE_PROVIDER_SITE_OTHER): Payer: PPO | Admitting: *Deleted

## 2016-11-11 DIAGNOSIS — Z7901 Long term (current) use of anticoagulants: Secondary | ICD-10-CM | POA: Diagnosis not present

## 2016-11-11 DIAGNOSIS — I4891 Unspecified atrial fibrillation: Secondary | ICD-10-CM

## 2016-11-11 LAB — POCT INR: INR: 2.8

## 2016-11-20 DIAGNOSIS — I2510 Atherosclerotic heart disease of native coronary artery without angina pectoris: Secondary | ICD-10-CM | POA: Diagnosis not present

## 2016-11-20 DIAGNOSIS — G4733 Obstructive sleep apnea (adult) (pediatric): Secondary | ICD-10-CM | POA: Diagnosis not present

## 2016-11-20 DIAGNOSIS — R0689 Other abnormalities of breathing: Secondary | ICD-10-CM | POA: Diagnosis not present

## 2016-11-20 DIAGNOSIS — R011 Cardiac murmur, unspecified: Secondary | ICD-10-CM | POA: Diagnosis not present

## 2016-12-07 ENCOUNTER — Other Ambulatory Visit: Payer: Self-pay

## 2016-12-07 ENCOUNTER — Ambulatory Visit (INDEPENDENT_AMBULATORY_CARE_PROVIDER_SITE_OTHER): Payer: PPO

## 2016-12-07 DIAGNOSIS — I350 Nonrheumatic aortic (valve) stenosis: Secondary | ICD-10-CM

## 2016-12-10 ENCOUNTER — Other Ambulatory Visit: Payer: Self-pay | Admitting: Cardiovascular Disease

## 2016-12-10 ENCOUNTER — Telehealth: Payer: Self-pay | Admitting: Cardiovascular Disease

## 2016-12-10 NOTE — Telephone Encounter (Signed)
Pharmacy calling stating the nitro that we sent in had instruction on it to be only brand name  Insurance will not cover it, unless it is generic If we need it to be brand name we would need to do a PA on it  Please advise

## 2016-12-10 NOTE — Telephone Encounter (Signed)
Pharmacy notified that the patient can have the generic Nitroglycerin instead of the Nitrostat.

## 2016-12-20 DIAGNOSIS — I2510 Atherosclerotic heart disease of native coronary artery without angina pectoris: Secondary | ICD-10-CM | POA: Diagnosis not present

## 2016-12-20 DIAGNOSIS — G4733 Obstructive sleep apnea (adult) (pediatric): Secondary | ICD-10-CM | POA: Diagnosis not present

## 2016-12-20 DIAGNOSIS — R0689 Other abnormalities of breathing: Secondary | ICD-10-CM | POA: Diagnosis not present

## 2016-12-20 DIAGNOSIS — R011 Cardiac murmur, unspecified: Secondary | ICD-10-CM | POA: Diagnosis not present

## 2016-12-22 DIAGNOSIS — E113513 Type 2 diabetes mellitus with proliferative diabetic retinopathy with macular edema, bilateral: Secondary | ICD-10-CM | POA: Diagnosis not present

## 2016-12-22 DIAGNOSIS — H35371 Puckering of macula, right eye: Secondary | ICD-10-CM | POA: Diagnosis not present

## 2016-12-22 DIAGNOSIS — D3132 Benign neoplasm of left choroid: Secondary | ICD-10-CM | POA: Diagnosis not present

## 2016-12-22 DIAGNOSIS — H43823 Vitreomacular adhesion, bilateral: Secondary | ICD-10-CM | POA: Diagnosis not present

## 2016-12-23 ENCOUNTER — Inpatient Hospital Stay: Payer: PPO | Attending: Internal Medicine

## 2016-12-23 ENCOUNTER — Ambulatory Visit (INDEPENDENT_AMBULATORY_CARE_PROVIDER_SITE_OTHER): Payer: PPO

## 2016-12-23 ENCOUNTER — Other Ambulatory Visit: Payer: Self-pay

## 2016-12-23 DIAGNOSIS — Z794 Long term (current) use of insulin: Secondary | ICD-10-CM | POA: Insufficient documentation

## 2016-12-23 DIAGNOSIS — G4730 Sleep apnea, unspecified: Secondary | ICD-10-CM | POA: Insufficient documentation

## 2016-12-23 DIAGNOSIS — K219 Gastro-esophageal reflux disease without esophagitis: Secondary | ICD-10-CM | POA: Diagnosis not present

## 2016-12-23 DIAGNOSIS — Z85828 Personal history of other malignant neoplasm of skin: Secondary | ICD-10-CM | POA: Insufficient documentation

## 2016-12-23 DIAGNOSIS — I4891 Unspecified atrial fibrillation: Secondary | ICD-10-CM

## 2016-12-23 DIAGNOSIS — Z800 Family history of malignant neoplasm of digestive organs: Secondary | ICD-10-CM | POA: Diagnosis not present

## 2016-12-23 DIAGNOSIS — Z86010 Personal history of colonic polyps: Secondary | ICD-10-CM | POA: Diagnosis not present

## 2016-12-23 DIAGNOSIS — D500 Iron deficiency anemia secondary to blood loss (chronic): Secondary | ICD-10-CM

## 2016-12-23 DIAGNOSIS — Z7901 Long term (current) use of anticoagulants: Secondary | ICD-10-CM

## 2016-12-23 DIAGNOSIS — Z7982 Long term (current) use of aspirin: Secondary | ICD-10-CM | POA: Diagnosis not present

## 2016-12-23 DIAGNOSIS — E669 Obesity, unspecified: Secondary | ICD-10-CM | POA: Diagnosis not present

## 2016-12-23 DIAGNOSIS — D509 Iron deficiency anemia, unspecified: Secondary | ICD-10-CM | POA: Insufficient documentation

## 2016-12-23 DIAGNOSIS — E119 Type 2 diabetes mellitus without complications: Secondary | ICD-10-CM | POA: Diagnosis not present

## 2016-12-23 DIAGNOSIS — Z8711 Personal history of peptic ulcer disease: Secondary | ICD-10-CM | POA: Diagnosis not present

## 2016-12-23 DIAGNOSIS — I252 Old myocardial infarction: Secondary | ICD-10-CM | POA: Insufficient documentation

## 2016-12-23 DIAGNOSIS — I10 Essential (primary) hypertension: Secondary | ICD-10-CM | POA: Insufficient documentation

## 2016-12-23 DIAGNOSIS — E785 Hyperlipidemia, unspecified: Secondary | ICD-10-CM | POA: Diagnosis not present

## 2016-12-23 DIAGNOSIS — I480 Paroxysmal atrial fibrillation: Secondary | ICD-10-CM | POA: Diagnosis not present

## 2016-12-23 DIAGNOSIS — Z8673 Personal history of transient ischemic attack (TIA), and cerebral infarction without residual deficits: Secondary | ICD-10-CM | POA: Diagnosis not present

## 2016-12-23 DIAGNOSIS — I350 Nonrheumatic aortic (valve) stenosis: Secondary | ICD-10-CM | POA: Diagnosis not present

## 2016-12-23 DIAGNOSIS — Z79899 Other long term (current) drug therapy: Secondary | ICD-10-CM | POA: Diagnosis not present

## 2016-12-23 DIAGNOSIS — I2510 Atherosclerotic heart disease of native coronary artery without angina pectoris: Secondary | ICD-10-CM | POA: Insufficient documentation

## 2016-12-23 DIAGNOSIS — Z801 Family history of malignant neoplasm of trachea, bronchus and lung: Secondary | ICD-10-CM | POA: Insufficient documentation

## 2016-12-23 LAB — CBC WITH DIFFERENTIAL/PLATELET
Basophils Absolute: 0 10*3/uL (ref 0–0.1)
Basophils Relative: 1 %
Eosinophils Absolute: 0.1 10*3/uL (ref 0–0.7)
Eosinophils Relative: 4 %
HCT: 37 % — ABNORMAL LOW (ref 40.0–52.0)
Hemoglobin: 12.6 g/dL — ABNORMAL LOW (ref 13.0–18.0)
Lymphocytes Relative: 29 %
Lymphs Abs: 1.1 10*3/uL (ref 1.0–3.6)
MCH: 31.1 pg (ref 26.0–34.0)
MCHC: 34.1 g/dL (ref 32.0–36.0)
MCV: 91.2 fL (ref 80.0–100.0)
Monocytes Absolute: 0.5 10*3/uL (ref 0.2–1.0)
Monocytes Relative: 14 %
Neutro Abs: 2 10*3/uL (ref 1.4–6.5)
Neutrophils Relative %: 52 %
Platelets: 183 10*3/uL (ref 150–440)
RBC: 4.05 MIL/uL — ABNORMAL LOW (ref 4.40–5.90)
RDW: 14.5 % (ref 11.5–14.5)
WBC: 3.7 10*3/uL — ABNORMAL LOW (ref 3.8–10.6)

## 2016-12-23 LAB — POCT INR: INR: 1.8

## 2016-12-23 LAB — IRON AND TIBC
Iron: 80 ug/dL (ref 45–182)
Saturation Ratios: 20 % (ref 17.9–39.5)
TIBC: 395 ug/dL (ref 250–450)
UIBC: 315 ug/dL

## 2016-12-23 LAB — FERRITIN: Ferritin: 14 ng/mL — ABNORMAL LOW (ref 24–336)

## 2016-12-30 ENCOUNTER — Inpatient Hospital Stay (HOSPITAL_BASED_OUTPATIENT_CLINIC_OR_DEPARTMENT_OTHER): Payer: PPO | Admitting: Internal Medicine

## 2016-12-30 ENCOUNTER — Inpatient Hospital Stay: Payer: PPO

## 2016-12-30 ENCOUNTER — Inpatient Hospital Stay: Payer: PPO | Admitting: Internal Medicine

## 2016-12-30 VITALS — BP 155/65 | HR 50 | Temp 98.2°F | Resp 20

## 2016-12-30 DIAGNOSIS — I350 Nonrheumatic aortic (valve) stenosis: Secondary | ICD-10-CM

## 2016-12-30 DIAGNOSIS — Z86010 Personal history of colonic polyps: Secondary | ICD-10-CM

## 2016-12-30 DIAGNOSIS — K219 Gastro-esophageal reflux disease without esophagitis: Secondary | ICD-10-CM

## 2016-12-30 DIAGNOSIS — Z79899 Other long term (current) drug therapy: Secondary | ICD-10-CM

## 2016-12-30 DIAGNOSIS — Z8711 Personal history of peptic ulcer disease: Secondary | ICD-10-CM

## 2016-12-30 DIAGNOSIS — D509 Iron deficiency anemia, unspecified: Secondary | ICD-10-CM | POA: Diagnosis not present

## 2016-12-30 DIAGNOSIS — Z794 Long term (current) use of insulin: Secondary | ICD-10-CM

## 2016-12-30 DIAGNOSIS — I4891 Unspecified atrial fibrillation: Secondary | ICD-10-CM | POA: Diagnosis not present

## 2016-12-30 DIAGNOSIS — Z8673 Personal history of transient ischemic attack (TIA), and cerebral infarction without residual deficits: Secondary | ICD-10-CM

## 2016-12-30 DIAGNOSIS — E669 Obesity, unspecified: Secondary | ICD-10-CM

## 2016-12-30 DIAGNOSIS — I252 Old myocardial infarction: Secondary | ICD-10-CM

## 2016-12-30 DIAGNOSIS — I10 Essential (primary) hypertension: Secondary | ICD-10-CM | POA: Diagnosis not present

## 2016-12-30 DIAGNOSIS — Z800 Family history of malignant neoplasm of digestive organs: Secondary | ICD-10-CM

## 2016-12-30 DIAGNOSIS — Z85828 Personal history of other malignant neoplasm of skin: Secondary | ICD-10-CM

## 2016-12-30 DIAGNOSIS — E119 Type 2 diabetes mellitus without complications: Secondary | ICD-10-CM | POA: Diagnosis not present

## 2016-12-30 DIAGNOSIS — Z801 Family history of malignant neoplasm of trachea, bronchus and lung: Secondary | ICD-10-CM

## 2016-12-30 DIAGNOSIS — G4730 Sleep apnea, unspecified: Secondary | ICD-10-CM

## 2016-12-30 DIAGNOSIS — D500 Iron deficiency anemia secondary to blood loss (chronic): Secondary | ICD-10-CM

## 2016-12-30 DIAGNOSIS — I480 Paroxysmal atrial fibrillation: Secondary | ICD-10-CM | POA: Diagnosis not present

## 2016-12-30 DIAGNOSIS — E785 Hyperlipidemia, unspecified: Secondary | ICD-10-CM

## 2016-12-30 DIAGNOSIS — I2510 Atherosclerotic heart disease of native coronary artery without angina pectoris: Secondary | ICD-10-CM

## 2016-12-30 DIAGNOSIS — Z7982 Long term (current) use of aspirin: Secondary | ICD-10-CM

## 2016-12-30 DIAGNOSIS — Z7901 Long term (current) use of anticoagulants: Secondary | ICD-10-CM

## 2016-12-30 NOTE — Assessment & Plan Note (Addendum)
#   Severe anemia- iron deficient- likely from stomach blood loss. Status post IV iron Feraheme in Nov 2017; hemoglobin today is 12.3.  Pt inclines toward PO iron. Will hold off IV iron.   # History of A. Fib- Coumadin as per cards.   # Recommend follow-up in 4 months with CBC and studies and ferritin possible Feraheme if needed. He'll have labs done a week prior.

## 2016-12-30 NOTE — Progress Notes (Signed)
[place] OFFICE PROGRESS NOTE  Patient Care Team: Rusty Aus, MD as PCP - General (Internal Medicine)   SUMMARY OF ONCOLOGIC HISTORY:  # MARCH 2017- April 2017- IRON DEFICIENCY ANEMIA ? Etiology s/p IV iron [EGD- ?June 2017- Bleeding gastric "polyp"/ colo- Dr.Gessner]  # hx of A.fib- on coumadin-HOLD April 2017/ CAD [Dr.[name]]; Hx of gastric ulcer [Dr.Gessner];   TIA/ CEA [s/p April 2017]  History of present illness:   71 -year-old Caucasian male patient is here for follow-up of his severe iron deficiency anemia- status post iron deficiency; also on Coumadin for A. fib is here for follow-up. Patient is currently taking iron pills once a day.   Appetite is good. Denies any nausea vomiting. No abdominal pain. He denies any significant fatigue or shortness of breath.  He continues to deny any blood in stools.  No chest pain or shortness of breath or cough.    REVIEW OF SYSTEMS:  A complete 10 point review of system is done which is negative except mentioned above/history of present illness.   PAST MEDICAL HISTORY :  Past Medical History  Diagnosis Date  . Hypertensive heart disease   . Hyperlipidemia   . Diabetes mellitus type II, controlled ([place])     a. Variable CBG 02/2012 - several meds adjusted.  . Mild aortic stenosis     a. 03/2011 Echo: EF 55-60%, Mild AS. b. Mild by cath 02/2012; c. 01/2014 Echo: EF 65-70%, Gr 1 DD, mild AS, mildly dil LA.  . Obesity   . GERD (gastroesophageal reflux disease)   . PAF (paroxysmal atrial fibrillation) ([place])     a. Newly dx 02/2012 & initiated on Coumadin (spont converted to NSR).  Marland Kitchen CAD (coronary artery disease)     a. 01/2010 CABG x 4: LIMA->LAD, VG->D1, VG->OM1, VG->PDA. b. NSTEMI 02/2012 in setting of AF-RVR with cath s/p BMS to RCA (plan for 1 month, possibly up to 3 months of Plavix)  . Carotid disease, bilateral ([place])     a. 03/2011 U/S: 40-59% bilat Carotid dzs;  b. 04/2015 Carotid U/S: 40-59% bilat ICA stenosis;  c. 05/2015 CTA Neck: 17% RICA, 79% LICA, mod-marked R vertebral stenosis, mod L vertebral stenosis.  . Myocardial infarction ([place]) 2014  . Hypertension   . Shortness of breath dyspnea   . Sleep apnea     CPAP with oxygen  . Chronic kidney disease   . Basal cell carcinoma of skin   . Anemia   . Macular edema bil    PAST SURGICAL HISTORY :   Past Surgical History  Procedure Laterality Date  . Cyst l kidney  [place]  . Angioplasty    . Cardiac catheterization    . Left heart catheterization with coronary angiogram N/A 03/09/2012    Procedure: LEFT HEART CATHETERIZATION WITH CORONARY ANGIOGRAM;  Surgeon: Wellington Hampshire, MD;  Location: [facility] CATH LAB;  Service: Cardiovascular;  Laterality: N/A;  . Percutaneous coronary stent intervention (pci-s)  03/09/2012    Procedure: PERCUTANEOUS CORONARY STENT INTERVENTION (PCI-S);  Surgeon: Wellington Hampshire, MD;  Location: [facility] CATH LAB;  Service: Cardiovascular;;  . Coronary artery bypass graft  2011    [facility], [place], [place],   . Coronary angioplasty      Dr. Fletcher Anon  . Cataract extraction Bilateral     Cataract Extraction with IOL  . Inguinal hernia repair Right     Right Inguinal Hernia Repair    FAMILY HISTORY :  Family History  Problem Relation Age of Onset  . COPD Mother     alive 42  . Heart attack Father     Aug 27, 2013 deceased  . Bladder Cancer Mother   . Lung cancer Maternal Aunt   . Liver cancer Maternal Aunt   . Kidney disease Paternal Uncle   . Cancer Other     all paternal aunts and uncles    SOCIAL HISTORY:   Social History  Substance Use Topics  . Smoking status: Never Smoker   . Smokeless tobacco: Never Used     Comment: tobacco use - no  . Alcohol Use: Yes     Comment: rare drink    ALLERGIES:  has No Known Allergies.  MEDICATIONS:  Current Outpatient Prescriptions  Medication Sig Dispense Refill  . acetaminophen (TYLENOL) 325 MG tablet Take 650 mg by mouth every 6 (six) hours as needed. For pain     . amiodarone (PACERONE) 200 MG tablet Take 0.5 tablets (100 mg total) by mouth daily. (Patient taking differently: Take 100 mg by mouth at bedtime. ) 45 tablet 3  . amLODipine (NORVASC) 5 MG tablet Take one tablet by mouth one time daily 90 tablet 3  . aspirin 81 MG EC tablet Take 81 mg by mouth daily.      Marland Kitchen atorvastatin (LIPITOR) 20 MG tablet Take 1 tablet (20 mg total) by mouth at bedtime. 90 tablet 3  . furosemide (LASIX) 20 MG tablet Take 1 tablet (20 mg total) by mouth as needed. 30 tablet 6  . glimepiride (AMARYL) 4 MG tablet Take 4 mg by mouth 2 (two) times daily.      . insulin glargine (LANTUS) 100 UNIT/ML injection Inject 20 Units into the skin daily.     . insulin lispro (HUMALOG) 100 UNIT/ML injection Inject 8 Units into the skin 2 (two) times daily with a meal. (Patient taking differently: Inject into the skin 3 (three) times daily. Per sliding scale.)    . metFORMIN (GLUCOPHAGE) 500 MG tablet Take 500 mg by mouth 2 (two) times daily with a meal.    . nitroGLYCERIN (NITROSTAT) 0.4 MG SL tablet Place 1 tablet (0.4 mg total) under the tongue every 5 (five) minutes as needed (up to 3 doses). 25 tablet 1  . OXYGEN Inhale 2.5 L/min into the lungs at bedtime. With CPAP    . pantoprazole (PROTONIX) 40 MG tablet Take 1 tablet (40 mg total) by mouth daily. (Patient taking differently: Take 40 mg by mouth 2 (two) times daily. ) 90 tablet 3  . ramipril (ALTACE) 10 MG capsule Take 1 capsule (10 mg total) by mouth daily. 90 capsule 5  . warfarin (COUMADIN) 5 MG tablet Take as directed by anticoagulation clinic 90 tablet 1  . IRON-VITAMIN C PO Take 1 tablet by mouth daily. Reported on 08/27/2015    . Omega-3 Fatty Acids (FISH OIL) 1200 MG CAPS Take 1 capsule by mouth daily. Reported on 08/27/2015     No current facility-administered medications for this visit.    PHYSICAL EXAMINATION:   BP 158/71 mmHg  Pulse 51  Temp(Src) 96.1 F (35.6 C)  Resp 18  Wt 192 lb 0.3 oz (87.1 kg)  Filed  Weights   08/27/15 1015  Weight: 192 lb 0.3 oz (87.1 kg)    GENERAL: Well-nourished well-developed; Alert, no distress and comfortable. Alone.  EYES: positive for pallor OROPHARYNX: no thrush or ulceration; good dentition  NECK: supple, no masses felt LYMPH:  no palpable lymphadenopathy in  the cervical, axillary or inguinal regions LUNGS: clear to auscultation and  No wheeze or crackles HEART/CVS: regular rate & rhythm and no murmurs; No lower extremity edema ABDOMEN:abdomen soft, non-tender and normal bowel sounds Musculoskeletal:no cyanosis of digits and no clubbing  PSYCH: alert & oriented x 3 with fluent speech NEURO: no focal motor/sensory deficits SKIN:  no rashes or significant lesions  LABORATORY DATA:  I have reviewed the data as listed    Component Value Date/Time   NA 139 07/25/2015 1138   NA 139 01/22/2013 0348   K 4.1 07/25/2015 1138   K 4.2 01/22/2013 0348   CL 103 07/25/2015 1138   CL 106 01/22/2013 0348   CO2 27 07/25/2015 1138   CO2 28 01/22/2013 0348   GLUCOSE 216* 07/25/2015 1138   GLUCOSE 158* 01/22/2013 0348   BUN 14 07/25/2015 1138   BUN 16 01/22/2013 0348   CREATININE 1.03 07/25/2015 1138   CREATININE 1.02 01/22/2013 0348   CALCIUM 9.1 07/25/2015 1138   CALCIUM 8.6 01/22/2013 0348   PROT 7.2 06/25/2015 1045   PROT 7.5 01/21/2013 0209   ALBUMIN 4.1 06/25/2015 1045   ALBUMIN 4.3 01/21/2013 0209   AST 30 06/25/2015 1045   AST 28 01/21/2013 0209   ALT 33 06/25/2015 1045   ALT 36 01/21/2013 0209   ALKPHOS 104 06/25/2015 1045   ALKPHOS 143* 01/21/2013 0209   BILITOT 0.4 06/25/2015 1045   BILITOT 0.4 01/21/2013 0209   GFRNONAA >60 07/25/2015 1138   GFRNONAA >60 01/22/2013 0348   GFRAA >60 07/25/2015 1138   GFRAA >60 01/22/2013 0348    No results found for: SPEP, UPEP  Lab Results  Component Value Date   WBC 4.5 08/22/2015   NEUTROABS 2.5 08/22/2015   HGB 8.0 Repeated and verified X2.* 08/22/2015   HCT 26.2 Repeated and verified X2.*  08/22/2015   MCV 67.4 Repeated and verified X2.* 08/22/2015   PLT 264.0 08/22/2015      Chemistry      Component Value Date/Time   NA 139 07/25/2015 1138   NA 139 01/22/2013 0348   K 4.1 07/25/2015 1138   K 4.2 01/22/2013 0348   CL 103 07/25/2015 1138   CL 106 01/22/2013 0348   CO2 27 07/25/2015 1138   CO2 28 01/22/2013 0348   BUN 14 07/25/2015 1138   BUN 16 01/22/2013 0348   CREATININE 1.03 07/25/2015 1138   CREATININE 1.02 01/22/2013 0348      Component Value Date/Time   CALCIUM 9.1 07/25/2015 1138   CALCIUM 8.6 01/22/2013 0348   ALKPHOS 104 06/25/2015 1045   ALKPHOS 143* 01/21/2013 0209   AST 30 06/25/2015 1045   AST 28 01/21/2013 0209   ALT 33 06/25/2015 1045   ALT 36 01/21/2013 0209   BILITOT 0.4 06/25/2015 1045   BILITOT 0.4 01/21/2013 0209        ASSESSMENT & PLAN:   Iron deficiency anemia due to chronic blood loss # Severe anemia- iron deficient- likely from stomach blood loss. Status post IV iron Feraheme in Nov 2017; hemoglobin today is 12.3.  Pt inclines toward PO iron. Will hold off IV iron.   # History of A. Fib- Coumadin as per cards.   # Recommend follow-up in 4 months with CBC and studies and ferritin possible Feraheme if needed. He'll have labs done a week prior.

## 2017-01-02 ENCOUNTER — Other Ambulatory Visit: Payer: Self-pay | Admitting: Cardiovascular Disease

## 2017-01-13 ENCOUNTER — Ambulatory Visit (INDEPENDENT_AMBULATORY_CARE_PROVIDER_SITE_OTHER): Payer: PPO | Admitting: *Deleted

## 2017-01-13 DIAGNOSIS — I4891 Unspecified atrial fibrillation: Secondary | ICD-10-CM

## 2017-01-13 DIAGNOSIS — Z7901 Long term (current) use of anticoagulants: Secondary | ICD-10-CM

## 2017-01-13 LAB — POCT INR: INR: 2.9

## 2017-01-20 DIAGNOSIS — G4733 Obstructive sleep apnea (adult) (pediatric): Secondary | ICD-10-CM | POA: Diagnosis not present

## 2017-01-20 DIAGNOSIS — R0689 Other abnormalities of breathing: Secondary | ICD-10-CM | POA: Diagnosis not present

## 2017-01-20 DIAGNOSIS — R011 Cardiac murmur, unspecified: Secondary | ICD-10-CM | POA: Diagnosis not present

## 2017-01-20 DIAGNOSIS — I2510 Atherosclerotic heart disease of native coronary artery without angina pectoris: Secondary | ICD-10-CM | POA: Diagnosis not present

## 2017-01-29 DIAGNOSIS — R011 Cardiac murmur, unspecified: Secondary | ICD-10-CM | POA: Diagnosis not present

## 2017-01-29 DIAGNOSIS — I2510 Atherosclerotic heart disease of native coronary artery without angina pectoris: Secondary | ICD-10-CM | POA: Diagnosis not present

## 2017-01-29 DIAGNOSIS — R0689 Other abnormalities of breathing: Secondary | ICD-10-CM | POA: Diagnosis not present

## 2017-01-29 DIAGNOSIS — G4733 Obstructive sleep apnea (adult) (pediatric): Secondary | ICD-10-CM | POA: Diagnosis not present

## 2017-02-20 DIAGNOSIS — R011 Cardiac murmur, unspecified: Secondary | ICD-10-CM | POA: Diagnosis not present

## 2017-02-20 DIAGNOSIS — G4733 Obstructive sleep apnea (adult) (pediatric): Secondary | ICD-10-CM | POA: Diagnosis not present

## 2017-02-20 DIAGNOSIS — R0689 Other abnormalities of breathing: Secondary | ICD-10-CM | POA: Diagnosis not present

## 2017-02-20 DIAGNOSIS — I2510 Atherosclerotic heart disease of native coronary artery without angina pectoris: Secondary | ICD-10-CM | POA: Diagnosis not present

## 2017-02-24 ENCOUNTER — Ambulatory Visit (INDEPENDENT_AMBULATORY_CARE_PROVIDER_SITE_OTHER): Payer: PPO

## 2017-02-24 DIAGNOSIS — I4891 Unspecified atrial fibrillation: Secondary | ICD-10-CM | POA: Diagnosis not present

## 2017-02-24 DIAGNOSIS — Z7901 Long term (current) use of anticoagulants: Secondary | ICD-10-CM

## 2017-02-24 LAB — POCT INR: INR: 2.4

## 2017-03-02 DIAGNOSIS — R011 Cardiac murmur, unspecified: Secondary | ICD-10-CM | POA: Diagnosis not present

## 2017-03-02 DIAGNOSIS — R0689 Other abnormalities of breathing: Secondary | ICD-10-CM | POA: Diagnosis not present

## 2017-03-02 DIAGNOSIS — G4733 Obstructive sleep apnea (adult) (pediatric): Secondary | ICD-10-CM | POA: Diagnosis not present

## 2017-03-02 DIAGNOSIS — I2510 Atherosclerotic heart disease of native coronary artery without angina pectoris: Secondary | ICD-10-CM | POA: Diagnosis not present

## 2017-03-10 DIAGNOSIS — E1139 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: Secondary | ICD-10-CM | POA: Diagnosis not present

## 2017-03-10 DIAGNOSIS — Z23 Encounter for immunization: Secondary | ICD-10-CM | POA: Diagnosis not present

## 2017-03-10 DIAGNOSIS — E538 Deficiency of other specified B group vitamins: Secondary | ICD-10-CM | POA: Diagnosis not present

## 2017-03-10 DIAGNOSIS — Z125 Encounter for screening for malignant neoplasm of prostate: Secondary | ICD-10-CM | POA: Diagnosis not present

## 2017-03-10 DIAGNOSIS — Z0000 Encounter for general adult medical examination without abnormal findings: Secondary | ICD-10-CM | POA: Diagnosis not present

## 2017-03-10 DIAGNOSIS — E1165 Type 2 diabetes mellitus with hyperglycemia: Secondary | ICD-10-CM | POA: Diagnosis not present

## 2017-03-15 ENCOUNTER — Ambulatory Visit (INDEPENDENT_AMBULATORY_CARE_PROVIDER_SITE_OTHER): Payer: PPO | Admitting: Vascular Surgery

## 2017-03-15 ENCOUNTER — Encounter (INDEPENDENT_AMBULATORY_CARE_PROVIDER_SITE_OTHER): Payer: Self-pay

## 2017-03-22 DIAGNOSIS — I2510 Atherosclerotic heart disease of native coronary artery without angina pectoris: Secondary | ICD-10-CM | POA: Diagnosis not present

## 2017-03-22 DIAGNOSIS — R0689 Other abnormalities of breathing: Secondary | ICD-10-CM | POA: Diagnosis not present

## 2017-03-22 DIAGNOSIS — G4733 Obstructive sleep apnea (adult) (pediatric): Secondary | ICD-10-CM | POA: Diagnosis not present

## 2017-03-22 DIAGNOSIS — R011 Cardiac murmur, unspecified: Secondary | ICD-10-CM | POA: Diagnosis not present

## 2017-04-07 ENCOUNTER — Ambulatory Visit (INDEPENDENT_AMBULATORY_CARE_PROVIDER_SITE_OTHER): Payer: PPO

## 2017-04-07 DIAGNOSIS — I4891 Unspecified atrial fibrillation: Secondary | ICD-10-CM

## 2017-04-07 DIAGNOSIS — Z7901 Long term (current) use of anticoagulants: Secondary | ICD-10-CM

## 2017-04-07 LAB — POCT INR: INR: 2.6

## 2017-04-22 DIAGNOSIS — R011 Cardiac murmur, unspecified: Secondary | ICD-10-CM | POA: Diagnosis not present

## 2017-04-22 DIAGNOSIS — I2510 Atherosclerotic heart disease of native coronary artery without angina pectoris: Secondary | ICD-10-CM | POA: Diagnosis not present

## 2017-04-22 DIAGNOSIS — D3132 Benign neoplasm of left choroid: Secondary | ICD-10-CM | POA: Diagnosis not present

## 2017-04-22 DIAGNOSIS — H35371 Puckering of macula, right eye: Secondary | ICD-10-CM | POA: Diagnosis not present

## 2017-04-22 DIAGNOSIS — E113513 Type 2 diabetes mellitus with proliferative diabetic retinopathy with macular edema, bilateral: Secondary | ICD-10-CM | POA: Diagnosis not present

## 2017-04-22 DIAGNOSIS — G4733 Obstructive sleep apnea (adult) (pediatric): Secondary | ICD-10-CM | POA: Diagnosis not present

## 2017-04-22 DIAGNOSIS — R0689 Other abnormalities of breathing: Secondary | ICD-10-CM | POA: Diagnosis not present

## 2017-04-26 ENCOUNTER — Inpatient Hospital Stay: Payer: PPO | Attending: Internal Medicine

## 2017-04-26 DIAGNOSIS — D649 Anemia, unspecified: Secondary | ICD-10-CM | POA: Insufficient documentation

## 2017-04-26 DIAGNOSIS — E119 Type 2 diabetes mellitus without complications: Secondary | ICD-10-CM | POA: Diagnosis not present

## 2017-04-26 DIAGNOSIS — Z803 Family history of malignant neoplasm of breast: Secondary | ICD-10-CM | POA: Diagnosis not present

## 2017-04-26 DIAGNOSIS — Z79899 Other long term (current) drug therapy: Secondary | ICD-10-CM | POA: Insufficient documentation

## 2017-04-26 DIAGNOSIS — I4891 Unspecified atrial fibrillation: Secondary | ICD-10-CM | POA: Insufficient documentation

## 2017-04-26 DIAGNOSIS — Z809 Family history of malignant neoplasm, unspecified: Secondary | ICD-10-CM | POA: Insufficient documentation

## 2017-04-26 DIAGNOSIS — Z800 Family history of malignant neoplasm of digestive organs: Secondary | ICD-10-CM | POA: Diagnosis not present

## 2017-04-26 DIAGNOSIS — Z7901 Long term (current) use of anticoagulants: Secondary | ICD-10-CM | POA: Insufficient documentation

## 2017-04-26 DIAGNOSIS — Z85828 Personal history of other malignant neoplasm of skin: Secondary | ICD-10-CM | POA: Insufficient documentation

## 2017-04-26 DIAGNOSIS — D500 Iron deficiency anemia secondary to blood loss (chronic): Secondary | ICD-10-CM | POA: Diagnosis not present

## 2017-04-26 DIAGNOSIS — I350 Nonrheumatic aortic (valve) stenosis: Secondary | ICD-10-CM | POA: Insufficient documentation

## 2017-04-26 DIAGNOSIS — K219 Gastro-esophageal reflux disease without esophagitis: Secondary | ICD-10-CM | POA: Diagnosis not present

## 2017-04-26 DIAGNOSIS — E669 Obesity, unspecified: Secondary | ICD-10-CM | POA: Insufficient documentation

## 2017-04-26 DIAGNOSIS — Z801 Family history of malignant neoplasm of trachea, bronchus and lung: Secondary | ICD-10-CM | POA: Insufficient documentation

## 2017-04-26 DIAGNOSIS — E785 Hyperlipidemia, unspecified: Secondary | ICD-10-CM | POA: Diagnosis not present

## 2017-04-26 DIAGNOSIS — I2510 Atherosclerotic heart disease of native coronary artery without angina pectoris: Secondary | ICD-10-CM | POA: Insufficient documentation

## 2017-04-26 DIAGNOSIS — G4730 Sleep apnea, unspecified: Secondary | ICD-10-CM | POA: Diagnosis not present

## 2017-04-26 DIAGNOSIS — I129 Hypertensive chronic kidney disease with stage 1 through stage 4 chronic kidney disease, or unspecified chronic kidney disease: Secondary | ICD-10-CM | POA: Insufficient documentation

## 2017-04-26 DIAGNOSIS — H3581 Retinal edema: Secondary | ICD-10-CM | POA: Diagnosis not present

## 2017-04-26 DIAGNOSIS — Z794 Long term (current) use of insulin: Secondary | ICD-10-CM | POA: Diagnosis not present

## 2017-04-26 DIAGNOSIS — R0602 Shortness of breath: Secondary | ICD-10-CM | POA: Insufficient documentation

## 2017-04-26 DIAGNOSIS — I10 Essential (primary) hypertension: Secondary | ICD-10-CM | POA: Diagnosis not present

## 2017-04-26 LAB — CBC WITH DIFFERENTIAL/PLATELET
Basophils Absolute: 0 10*3/uL (ref 0–0.1)
Basophils Relative: 1 %
Eosinophils Absolute: 0.1 10*3/uL (ref 0–0.7)
Eosinophils Relative: 5 %
HCT: 36.4 % — ABNORMAL LOW (ref 40.0–52.0)
Hemoglobin: 12.2 g/dL — ABNORMAL LOW (ref 13.0–18.0)
Lymphocytes Relative: 31 %
Lymphs Abs: 1 10*3/uL (ref 1.0–3.6)
MCH: 31.3 pg (ref 26.0–34.0)
MCHC: 33.6 g/dL (ref 32.0–36.0)
MCV: 93.2 fL (ref 80.0–100.0)
Monocytes Absolute: 0.4 10*3/uL (ref 0.2–1.0)
Monocytes Relative: 14 %
Neutro Abs: 1.6 10*3/uL (ref 1.4–6.5)
Neutrophils Relative %: 49 %
Platelets: 180 10*3/uL (ref 150–440)
RBC: 3.91 MIL/uL — ABNORMAL LOW (ref 4.40–5.90)
RDW: 14.1 % (ref 11.5–14.5)
WBC: 3.1 10*3/uL — ABNORMAL LOW (ref 3.8–10.6)

## 2017-04-26 LAB — BASIC METABOLIC PANEL
Anion gap: 8 (ref 5–15)
BUN: 16 mg/dL (ref 6–20)
CO2: 27 mmol/L (ref 22–32)
Calcium: 8.9 mg/dL (ref 8.9–10.3)
Chloride: 105 mmol/L (ref 101–111)
Creatinine, Ser: 0.97 mg/dL (ref 0.61–1.24)
GFR calc Af Amer: >60 mL/min (ref >60)
GFR calc non Af Amer: >60 mL/min (ref >60)
Glucose, Bld: 151 mg/dL — ABNORMAL HIGH (ref 65–99)
Potassium: 4.7 mmol/L (ref 3.5–5.1)
Sodium: 140 mmol/L (ref 135–145)

## 2017-04-26 LAB — FERRITIN: Ferritin: 13 ng/mL — ABNORMAL LOW (ref 24–336)

## 2017-04-26 LAB — IRON AND TIBC
Iron: 67 ug/dL (ref 45–182)
Saturation Ratios: 17 % — ABNORMAL LOW (ref 17.9–39.5)
TIBC: 389 ug/dL (ref 250–450)
UIBC: 322 ug/dL

## 2017-05-03 ENCOUNTER — Ambulatory Visit (INDEPENDENT_AMBULATORY_CARE_PROVIDER_SITE_OTHER): Payer: PPO | Admitting: Vascular Surgery

## 2017-05-03 ENCOUNTER — Encounter (INDEPENDENT_AMBULATORY_CARE_PROVIDER_SITE_OTHER): Payer: Self-pay

## 2017-05-05 ENCOUNTER — Encounter: Payer: Self-pay | Admitting: Oncology

## 2017-05-05 ENCOUNTER — Inpatient Hospital Stay: Payer: PPO

## 2017-05-05 ENCOUNTER — Inpatient Hospital Stay (HOSPITAL_BASED_OUTPATIENT_CLINIC_OR_DEPARTMENT_OTHER): Payer: PPO | Admitting: Oncology

## 2017-05-05 DIAGNOSIS — I129 Hypertensive chronic kidney disease with stage 1 through stage 4 chronic kidney disease, or unspecified chronic kidney disease: Secondary | ICD-10-CM | POA: Diagnosis not present

## 2017-05-05 DIAGNOSIS — Z79899 Other long term (current) drug therapy: Secondary | ICD-10-CM

## 2017-05-05 DIAGNOSIS — G4730 Sleep apnea, unspecified: Secondary | ICD-10-CM | POA: Diagnosis not present

## 2017-05-05 DIAGNOSIS — E669 Obesity, unspecified: Secondary | ICD-10-CM | POA: Diagnosis not present

## 2017-05-05 DIAGNOSIS — D500 Iron deficiency anemia secondary to blood loss (chronic): Secondary | ICD-10-CM

## 2017-05-05 DIAGNOSIS — H3581 Retinal edema: Secondary | ICD-10-CM

## 2017-05-05 DIAGNOSIS — I4891 Unspecified atrial fibrillation: Secondary | ICD-10-CM

## 2017-05-05 DIAGNOSIS — K219 Gastro-esophageal reflux disease without esophagitis: Secondary | ICD-10-CM | POA: Diagnosis not present

## 2017-05-05 DIAGNOSIS — R0602 Shortness of breath: Secondary | ICD-10-CM

## 2017-05-05 DIAGNOSIS — E785 Hyperlipidemia, unspecified: Secondary | ICD-10-CM

## 2017-05-05 DIAGNOSIS — Z801 Family history of malignant neoplasm of trachea, bronchus and lung: Secondary | ICD-10-CM

## 2017-05-05 DIAGNOSIS — I10 Essential (primary) hypertension: Secondary | ICD-10-CM

## 2017-05-05 DIAGNOSIS — Z7901 Long term (current) use of anticoagulants: Secondary | ICD-10-CM

## 2017-05-05 DIAGNOSIS — E119 Type 2 diabetes mellitus without complications: Secondary | ICD-10-CM | POA: Diagnosis not present

## 2017-05-05 DIAGNOSIS — I2510 Atherosclerotic heart disease of native coronary artery without angina pectoris: Secondary | ICD-10-CM | POA: Diagnosis not present

## 2017-05-05 DIAGNOSIS — Z800 Family history of malignant neoplasm of digestive organs: Secondary | ICD-10-CM

## 2017-05-05 DIAGNOSIS — Z803 Family history of malignant neoplasm of breast: Secondary | ICD-10-CM

## 2017-05-05 DIAGNOSIS — Z809 Family history of malignant neoplasm, unspecified: Secondary | ICD-10-CM

## 2017-05-05 DIAGNOSIS — Z85828 Personal history of other malignant neoplasm of skin: Secondary | ICD-10-CM

## 2017-05-05 DIAGNOSIS — I350 Nonrheumatic aortic (valve) stenosis: Secondary | ICD-10-CM

## 2017-05-05 DIAGNOSIS — D649 Anemia, unspecified: Secondary | ICD-10-CM

## 2017-05-05 DIAGNOSIS — Z794 Long term (current) use of insulin: Secondary | ICD-10-CM

## 2017-05-05 NOTE — Progress Notes (Signed)
[facility] OFFICE PROGRESS NOTE  Patient Care Team: Rusty Aus, MD as PCP - General (Internal Medicine)   SUMMARY OF ONCOLOGIC HISTORY:  # MARCH 2017- April 2017- IRON DEFICIENCY ANEMIA ? Etiology s/p IV iron [EGD- ?June 2017- Bleeding gastric "polyp"/ colo- Dr.Gessner]  # hx of A.fib- on coumadin-HOLD April 2017/ CAD [Dr.[name]]; Hx of gastric ulcer [Dr.Gessner];   TIA/ CEA [s/p April 2017]  History of present illness:  71 -year-old Caucasian male patient is here for follow-up of his severe iron deficiency anemia- status post iron deficiency; also on Coumadin for A. fib is here for follow-up. Patient is currently taking iron pills once a day.  Patient's appetite remains good and he denies any nausea or vomiting. Patient denies any abdominal pain or significant fatigue or shortness of breath. He denies blood in stools, chest pain or cough. He overall has "felt well".  REVIEW OF SYSTEMS:  A complete 10 point review of system is done which is negative except mentioned above/history of present illness.   PAST MEDICAL HISTORY :  Past Medical History  Diagnosis Date  . Hypertensive heart disease   . Hyperlipidemia   . Diabetes mellitus type II, controlled ([place])     a. Variable CBG 02/2012 - several meds adjusted.  . Mild aortic stenosis     a. 03/2011 Echo: EF 55-60%, Mild AS. b. Mild by cath 02/2012; c. 01/2014 Echo: EF 65-70%, Gr 1 DD, mild AS, mildly dil LA.  . Obesity   . GERD (gastroesophageal reflux disease)   . PAF (paroxysmal atrial fibrillation) ([place])     a. Newly dx 02/2012 & initiated on Coumadin (spont converted to NSR).  Marland Kitchen CAD (coronary artery disease)     a. 01/2010 CABG x 4: LIMA->LAD, VG->D1, VG->OM1, VG->PDA. b. NSTEMI 02/2012 in setting of AF-RVR with cath s/p BMS to RCA (plan for 1 month, possibly up to 3 months of Plavix)  . Carotid disease, bilateral ([place])     a. 03/2011 U/S: 40-59% bilat Carotid dzs;  b. 04/2015 Carotid U/S: 40-59% bilat ICA  stenosis; c. 05/2015 CTA Neck: 13% RICA, 24% LICA, mod-marked R vertebral stenosis, mod L vertebral stenosis.  . Myocardial infarction ([place]) 2014  . Hypertension   . Shortness of breath dyspnea   . Sleep apnea     CPAP with oxygen  . Chronic kidney disease   . Basal cell carcinoma of skin   . Anemia   . Macular edema bil    PAST SURGICAL HISTORY :   Past Surgical History  Procedure Laterality Date  . Cyst l kidney  [place]  . Angioplasty    . Cardiac catheterization    . Left heart catheterization with coronary angiogram N/A 03/09/2012    Procedure: LEFT HEART CATHETERIZATION WITH CORONARY ANGIOGRAM;  Surgeon: Wellington Hampshire, MD;  Location: [facility] CATH LAB;  Service: Cardiovascular;  Laterality: N/A;  . Percutaneous coronary stent intervention (pci-s)  03/09/2012    Procedure: PERCUTANEOUS CORONARY STENT INTERVENTION (PCI-S);  Surgeon: Wellington Hampshire, MD;  Location: [facility] C[facility] LAB;  Service: Cardiovascular;;  . Coronary artery bypass graft  2011    [facility], [place], [place],   . Coronary angioplasty      Dr. Fletcher Anon  . Cataract extraction Bilateral     Cataract Extraction with IOL  . Inguinal hernia repair Right     Right Inguinal Hernia Repair    FAMILY HISTORY :   Family History  Problem Relation  Age of Onset  . COPD Mother     alive 59  . Heart attack Father     2013-09-02 deceased  . Bladder Cancer Mother   . Lung cancer Maternal Aunt   . Liver cancer Maternal Aunt   . Kidney disease Paternal Uncle   . Cancer Other     all paternal aunts and uncles    SOCIAL HISTORY:   Social History  Substance Use Topics  . Smoking status: Never Smoker   . Smokeless tobacco: Never Used     Comment: tobacco use - no  . Alcohol Use: Yes     Comment: rare drink    ALLERGIES:  has No Known Allergies.  MEDICATIONS:  Current Outpatient Prescriptions  Medication Sig Dispense Refill  . acetaminophen (TYLENOL) 325 MG tablet Take 650 mg by mouth every 6 (six) hours as needed.  For pain    . amiodarone (PACERONE) 200 MG tablet Take 0.5 tablets (100 mg total) by mouth daily. (Patient taking differently: Take 100 mg by mouth at bedtime. ) 45 tablet 3  . amLODipine (NORVASC) 5 MG tablet Take one tablet by mouth one time daily 90 tablet 3  . aspirin 81 MG EC tablet Take 81 mg by mouth daily.      Marland Kitchen atorvastatin (LIPITOR) 20 MG tablet Take 1 tablet (20 mg total) by mouth at bedtime. 90 tablet 3  . furosemide (LASIX) 20 MG tablet Take 1 tablet (20 mg total) by mouth as needed. 30 tablet 6  . glimepiride (AMARYL) 4 MG tablet Take 4 mg by mouth 2 (two) times daily.      . insulin glargine (LANTUS) 100 UNIT/ML injection Inject 20 Units into the skin daily.     . insulin lispro (HUMALOG) 100 UNIT/ML injection Inject 8 Units into the skin 2 (two) times daily with a meal. (Patient taking differently: Inject into the skin 3 (three) times daily. Per sliding scale.)    . metFORMIN (GLUCOPHAGE) 500 MG tablet Take 500 mg by mouth 2 (two) times daily with a meal.    . nitroGLYCERIN (NITROSTAT) 0.4 MG SL tablet Place 1 tablet (0.4 mg total) under the tongue every 5 (five) minutes as needed (up to 3 doses). 25 tablet 1  . OXYGEN Inhale 2.5 L/min into the lungs at bedtime. With CPAP    . pantoprazole (PROTONIX) 40 MG tablet Take 1 tablet (40 mg total) by mouth daily. (Patient taking differently: Take 40 mg by mouth 2 (two) times daily. ) 90 tablet 3  . ramipril (ALTACE) 10 MG capsule Take 1 capsule (10 mg total) by mouth daily. 90 capsule 5  . warfarin (COUMADIN) 5 MG tablet Take as directed by anticoagulation clinic 90 tablet 1  . IRON-VITAMIN C PO Take 1 tablet by mouth daily. Reported on 08/27/2015    . Omega-3 Fatty Acids (FISH OIL) 1200 MG CAPS Take 1 capsule by mouth daily. Reported on 08/27/2015     No current facility-administered medications for this visit.    PHYSICAL EXAMINATION:   BP  187/55 mmHg  Pulse 54  Temp(Src) 98.9 F (35.6 C)  Resp 18  Wt 195 lb 0.3 oz (87.1  kg)  Filed Weights   08/27/15 1015  Weight: 192 lb 0.3 oz (87.1 kg)    GENERAL: Well-nourished well-developed; Alert, no distress and comfortable. Alone.  EYES: positive for pallor OROPHARYNX: no thrush or ulceration; good dentition  NECK: supple, no masses felt LYMPH:  no palpable lymphadenopathy in the cervical, axillary or  inguinal regions LUNGS: clear to auscultation and  No wheeze or crackles HEART/CVS: regular rate & rhythm and no murmurs; No lower extremity edema ABDOMEN:abdomen soft, non-tender and normal bowel sounds Musculoskeletal:no cyanosis of digits and no clubbing  PSYCH: alert & oriented x 3 with fluent speech NEURO: no focal motor/sensory deficits SKIN:  no rashes or significant lesions  LABORATORY DATA:  I have reviewed the data as listed    Component Value Date/Time   NA 139 07/25/2015 1138   NA 139 01/22/2013 0348   K 4.1 07/25/2015 1138   K 4.2 01/22/2013 0348   CL 103 07/25/2015 1138   CL 106 01/22/2013 0348   CO2 27 07/25/2015 1138   CO2 28 01/22/2013 0348   GLUCOSE 216* 07/25/2015 1138   GLUCOSE 158* 01/22/2013 0348   BUN 14 07/25/2015 1138   BUN 16 01/22/2013 0348   CREATININE 1.03 07/25/2015 1138   CREATININE 1.02 01/22/2013 0348   CALCIUM 9.1 07/25/2015 1138   CALCIUM 8.6 01/22/2013 0348   PROT 7.2 06/25/2015 1045   PROT 7.5 01/21/2013 0209   ALBUMIN 4.1 06/25/2015 1045   ALBUMIN 4.3 01/21/2013 0209   AST 30 06/25/2015 1045   AST 28 01/21/2013 0209   ALT 33 06/25/2015 1045   ALT 36 01/21/2013 0209   ALKPHOS 104 06/25/2015 1045   ALKPHOS 143* 01/21/2013 0209   BILITOT 0.4 06/25/2015 1045   BILITOT 0.4 01/21/2013 0209   GFRNONAA >60 07/25/2015 1138   GFRNONAA >60 01/22/2013 0348   GFRAA >60 07/25/2015 1138   GFRAA >60 01/22/2013 0348    No results found for: SPEP, UPEP  Lab Results  Component Value Date   WBC 4.5 08/22/2015   NEUTROABS 2.5 08/22/2015   HGB 8.0 Repeated and verified X2.* 08/22/2015   HCT 26.2 Repeated and  verified X2.* 08/22/2015   MCV 67.4 Repeated and verified X2.* 08/22/2015   PLT 264.0 08/22/2015      Chemistry      Component Value Date/Time   NA 139 07/25/2015 1138   NA 139 01/22/2013 0348   K 4.1 07/25/2015 1138   K 4.2 01/22/2013 0348   CL 103 07/25/2015 1138   CL 106 01/22/2013 0348   CO2 27 07/25/2015 1138   CO2 28 01/22/2013 0348   BUN 14 07/25/2015 1138   BUN 16 01/22/2013 0348   CREATININE 1.03 07/25/2015 1138   CREATININE 1.02 01/22/2013 0348      Component Value Date/Time   CALCIUM 9.1 07/25/2015 1138   CALCIUM 8.6 01/22/2013 0348   ALKPHOS 104 06/25/2015 1045   ALKPHOS 143* 01/21/2013 0209   AST 30 06/25/2015 1045   AST 28 01/21/2013 0209   ALT 33 06/25/2015 1045   ALT 36 01/21/2013 0209   BILITOT 0.4 06/25/2015 1045   BILITOT 0.4 01/21/2013 0209        ASSESSMENT & PLAN:   Iron deficiency anemia due to chronic blood loss # Severe anemia- iron deficient- likely from stomach blood loss. Status post IV iron Feraheme in Nov 2017; hemoglobin today is 12.2.  Pt would like to continue PO iron at this time. It was recommended for him to have Feraheme infusion but patient refused. Ferritin today is 13. Patient educated on signs and symptoms of anemia and to let us know if he exhibits symptoms prior to his appointment in March.  # History of A. Fib- Coumadin as per cards.   #Hypertension: Elevated today. Patients states he is compliant with BP meds. Rechecked manually BP  still elevated 178/69. Recommended making an appointment with cardiology or PCP if hypertension persists. He checks BP daily at home.  # Recommend follow-up in 3 months with CBC and studies and ferritin possible Feraheme if needed. He'll have labs done a week prior.  Faythe Casa, NP 05/06/2017 2:20 PM

## 2017-05-06 NOTE — Assessment & Plan Note (Addendum)
#   Severe anemia- iron deficient- likely from stomach blood loss. Status post IV iron Feraheme in Nov 2017; hemoglobin today is 12.2.  Pt would like to continue PO iron at this time. It was recommended for him to have Feraheme infusion but patient refused. Ferritin today is 13. Patient educated on signs and symptoms of anemia and to let us know if he exhibits symptoms prior to his appointment in March.  # History of A. Fib- Coumadin as per cards.   #Hypertension: Elevated today. Patients states he is compliant with BP meds. Rechecked manually BP still elevated 178/69. Recommended making an appointment with cardiology or PCP if hypertension persists. He checks BP daily at home.  # Recommend follow-up in 3 months with CBC and studies and ferritin possible Feraheme if needed. He'll have labs done a week prior.

## 2017-05-10 ENCOUNTER — Ambulatory Visit (INDEPENDENT_AMBULATORY_CARE_PROVIDER_SITE_OTHER): Payer: PPO

## 2017-05-10 DIAGNOSIS — Z7901 Long term (current) use of anticoagulants: Secondary | ICD-10-CM

## 2017-05-10 DIAGNOSIS — I4891 Unspecified atrial fibrillation: Secondary | ICD-10-CM

## 2017-05-10 LAB — POCT INR: INR: 2.7

## 2017-05-10 NOTE — Patient Instructions (Signed)
Continue taking 1 tablet daily except 1/2 tablet on Tuesdays and Saturdays.  Recheck INR in 6 weeks.

## 2017-05-22 DIAGNOSIS — I2510 Atherosclerotic heart disease of native coronary artery without angina pectoris: Secondary | ICD-10-CM | POA: Diagnosis not present

## 2017-05-22 DIAGNOSIS — R0689 Other abnormalities of breathing: Secondary | ICD-10-CM | POA: Diagnosis not present

## 2017-05-22 DIAGNOSIS — G4733 Obstructive sleep apnea (adult) (pediatric): Secondary | ICD-10-CM | POA: Diagnosis not present

## 2017-05-22 DIAGNOSIS — R011 Cardiac murmur, unspecified: Secondary | ICD-10-CM | POA: Diagnosis not present

## 2017-06-09 NOTE — Progress Notes (Signed)
MRN : [id number]  Seth Hardy is a 72 y.o. (May 01, 1946) male who presents with chief complaint of No chief complaint on file. Marland Kitchen  History of Present Illness: The patient is seen for follow up evaluation of carotid stenosis. The carotid stenosis followed by ultrasound.   The patient denies amaurosis fugax. There is no recent history of TIA symptoms or focal motor deficits. There is no prior documented CVA.  The patient is taking enteric-coated aspirin 81 mg daily.  There is no history of migraine headaches. There is no history of seizures.  The patient has a history of coronary artery disease, no recent episodes of angina or shortness of breath. The patient denies PAD or claudication symptoms. There is a history of hyperlipidemia which is being treated with a statin.    Carotid Duplex done today shows 46-65% RICA and <99% LICA.  No change compared to last study in 03/12/2016  No outpatient medications have been marked as taking for the 06/10/17 encounter (Appointment) with Delana Meyer, Dolores Lory, MD.    Past Medical History:  Diagnosis Date  . Anemia 08/2015   received 2 units rbc one week ago, 3 IV iron infusion -last 1 week.  . Basal cell carcinoma of skin 2012   removed several spots from arms and back  . CAD (coronary artery disease)    a. 01/2010 CABG x 4: LIMA->LAD, VG->D1, VG->OM1, VG->PDA. b. NSTEMI 02/2012 in setting of AF-RVR with cath s/p BMS to RCA (plan for 1 month, possibly up to 3 months of Plavix)  . Carotid disease, bilateral ([place])    a. 03/2011 U/S: 40-59% bilat Carotid dzs;  b. 04/2015 Carotid U/S: 40-59% bilat ICA stenosis; c. 05/2015 CTA Neck: 35% RICA, 70% LICA, mod-marked R vertebral stenosis, mod L vertebral stenosis.  . Chronic kidney disease    small obtusion per pcp. ultrasound done on 08/29/2015  . Diabetes mellitus type II, controlled ([place])    a. Variable CBG 02/2012 - several meds adjusted.  Marland Kitchen Dysrhythmia    intermittent Atrial Fibrillation  . GERD  (gastroesophageal reflux disease)   . Hyperlipidemia   . Hypertension   . Hypertensive heart disease   . Macular edema bil   lazer work done previously  . Mild aortic stenosis    a. 03/2011 Echo: EF 55-60%, Mild AS. b. Mild by cath 02/2012; c. 01/2014 Echo: EF 65-70%, Gr 1 DD, mild AS, mildly dil LA.  . Multiple gastric ulcers 2006  . Myocardial infarction [facility]) 2014   stents (x1) at time  . Obesity   . PAF (paroxysmal atrial fibrillation) ([place])    a. Newly dx 02/2012 & initiated on Coumadin (spont converted to NSR).  . Shortness of breath dyspnea   . Sleep apnea    CPAP settings 3 with oxygen 2.5  . Transfusion history    transfusions -1 month ago -2 units    Past Surgical History:  Procedure Laterality Date  . ANGIOPLASTY    . CARDIAC CATHETERIZATION  2014  . CATARACT EXTRACTION Bilateral 2016   Cataract Extraction with IOL  . COLONOSCOPY WITH PROPOFOL N/A 10/15/2015   Procedure: COLONOSCOPY WITH PROPOFOL;  Surgeon: Gatha Mayer, MD;  Location: [facility] ENDOSCOPY;  Service: Endoscopy;  Laterality: N/A;  . CORONARY ANGIOPLASTY     Dr. Fletcher Anon  . CORONARY ARTERY BYPASS GRAFT  2011   [facility], [place], [place],   . cyst L kidney  [place]  . ENDARTERECTOMY Left 08/30/2015   Procedure:  ENDARTERECTOMY CAROTID;  Surgeon: Katha Cabal, MD;  Location: [facility] ORS;  Service: Vascular;  Laterality: Left;  . ESOPHAGOGASTRODUODENOSCOPY (EGD) WITH PROPOFOL N/A 10/15/2015   Procedure: ESOPHAGOGASTRODUODENOSCOPY (EGD) WITH PROPOFOL;  Surgeon: Gatha Mayer, MD;  Location: [facility] ENDOSCOPY;  Service: Endoscopy;  Laterality: N/A;  . INGUINAL HERNIA REPAIR Right    Right Inguinal Hernia Repair  . LEFT HEART CATHETERIZATION WITH CORONARY ANGIOGRAM N/A 03/09/2012   Procedure: LEFT HEART CATHETERIZATION WITH CORONARY ANGIOGRAM;  Surgeon: Wellington Hampshire, MD;  Location: [facility] CATH LAB;  Service: Cardiovascular;  Laterality: N/A;  . PERCUTANEOUS CORONARY STENT INTERVENTION (PCI-S)  03/09/2012    Procedure: PERCUTANEOUS CORONARY STENT INTERVENTION (PCI-S);  Surgeon: Wellington Hampshire, MD;  Location: [facility] CATH LAB;  Service: Cardiovascular;;    Social History Social History   Tobacco Use  . Smoking status: Never Smoker  . Smokeless tobacco: Never Used  . Tobacco comment: tobacco use - no.no passive smoke in home  Substance Use Topics  . Alcohol use: Yes    Comment: rare drink  . Drug use: No    Family History Family History  Problem Relation Age of Onset  . COPD Mother        alive 51  . Bladder Cancer Mother   . Heart attack Father        23-Aug-2013 deceased  . Lung cancer Maternal Aunt   . Liver cancer Maternal Aunt   . Kidney disease Paternal Uncle   . Cancer Other        all paternal aunts and uncles  . Prostate cancer Neg Hx   . Kidney cancer Neg Hx     No Known Allergies   REVIEW OF SYSTEMS (Negative unless checked)  Constitutional: [] Weight loss  [] Fever  [] Chills Cardiac: [] Chest pain   [] Chest pressure   [] Palpitations   [] Shortness of breath when laying flat   [] Shortness of breath with exertion. Vascular:  [] Pain in legs with walking   [] Pain in legs at rest  [] History of DVT   [] Phlebitis   [] Swelling in legs   [] Varicose veins   [] Non-healing ulcers Pulmonary:   [] Uses home oxygen   [] Productive cough   [] Hemoptysis   [] Wheeze  [] COPD   [] Asthma Neurologic:  [] Dizziness   [] Seizures   [] History of stroke   [] History of TIA  [] Aphasia   [] Vissual changes   [] Weakness or numbness in arm   [] Weakness or numbness in leg Musculoskeletal:   [] Joint swelling   [] Joint pain   [] Low back pain Hematologic:  [] Easy bruising  [] Easy bleeding   [] Hypercoagulable state   [] Anemic Gastrointestinal:  [] Diarrhea   [] Vomiting  [] Gastroesophageal reflux/heartburn   [] Difficulty swallowing. Genitourinary:  [] Chronic kidney disease   [] Difficult urination  [] Frequent urination   [] Blood in urine Skin:  [] Rashes   [] Ulcers  Psychological:  [] History of anxiety   []  History of  major depression.  Physical Examination  There were no vitals filed for this visit. There is no height or weight on file to calculate BMI. Gen: WD/WN, NAD Head: [place]/AT, No temporalis wasting.  Ear/Nose/Throat: Hearing grossly intact, nares w/o erythema or drainage Eyes: PER, EOMI, sclera nonicteric.  Neck: Supple, no large masses.   Pulmonary:  Good air movement, no audible wheezing bilaterally, no use of accessory muscles.  Cardiac: RRR, no JVD Vascular:  Right carotid bruit Vessel Right Left  Radial Palpable Palpable  Ulnar Palpable Palpable  Brachial Palpable Palpable  Carotid Palpable Palpable  Gastrointestinal: Non-distended. No guarding/no  peritoneal signs.  Musculoskeletal: M/S 5/5 throughout.  No deformity or atrophy.  Neurologic: CN 2-12 intact. Symmetrical.  Speech is fluent. Motor exam as listed above. Psychiatric: Judgment intact, Mood & affect appropriate for pt's clinical situation. Dermatologic: No rashes or ulcers noted.  No changes consistent with cellulitis. Lymph : No lichenification or skin changes of chronic lymphedema.  CBC Lab Results  Component Value Date   WBC 3.1 (L) 04/26/2017   HGB 12.2 (L) 04/26/2017   HCT 36.4 (L) 04/26/2017   MCV 93.2 04/26/2017   PLT 180 04/26/2017    BMET    Component Value Date/Time   NA 140 04/26/2017 0818   NA 139 01/22/2013 0348   K 4.7 04/26/2017 0818   K 4.2 01/22/2013 0348   CL 105 04/26/2017 0818   CL 106 01/22/2013 0348   CO2 27 04/26/2017 0818   CO2 28 01/22/2013 0348   GLUCOSE 151 (H) 04/26/2017 0818   GLUCOSE 158 (H) 01/22/2013 0348   BUN 16 04/26/2017 0818   BUN 16 01/22/2013 0348   CREATININE 0.97 04/26/2017 0818   CREATININE 1.02 01/22/2013 0348   CALCIUM 8.9 04/26/2017 0818   CALCIUM 8.6 01/22/2013 0348   GFRNONAA >60 04/26/2017 0818   GFRNONAA >60 01/22/2013 0348   GFRAA >60 04/26/2017 0818   GFRAA >60 01/22/2013 0348   CrCl cannot be calculated (Patient's most recent lab result is older than  the maximum 21 days allowed.).  COAG Lab Results  Component Value Date   INR 2.7 05/10/2017   INR 2.6 04/07/2017   INR 2.4 02/24/2017    Radiology No results found.  Assessment/Plan 1. Bilateral carotid artery stenosis Recommend:  Given the patient's asymptomatic subcritical stenosis no further invasive testing or surgery at this time.  Duplex ultrasound shows <70% stenosis bilaterally.  Continue antiplatelet therapy as prescribed Continue management of CAD, HTN and Hyperlipidemia Healthy heart diet,  encouraged exercise at least 4 times per week Follow up in 6 months with duplex ultrasound and physical exam based on >50% stenosis of the RICA carotid artery    2. Benign essential hypertension Continue antihypertensive medications as already ordered, these medications have been reviewed and there are no changes at this time.   3. Atrial fibrillation, unspecified type ([place]) Continue antiarrhythmia medications as already ordered, these medications have been reviewed and there are no changes at this time.  Continue anticoagulation as ordered by Cardiology Service   4. Coronary artery disease involving native coronary artery of native heart without angina pectoris Continue cardiac and antihypertensive medications as already ordered and reviewed, no changes at this time.  Continue statin as ordered and reviewed, no changes at this time  Nitrates PRN for chest pain   5. Controlled type 2 diabetes mellitus without complication, with long-term current use of insulin (HCC) Continue hypoglycemic medications as already ordered, these medications have been reviewed and there are no changes at this time.  Hgb A1C to be monitored as already arranged by primary service     Hortencia Pilar, MD  06/09/2017 7:54 PM

## 2017-06-10 ENCOUNTER — Ambulatory Visit (INDEPENDENT_AMBULATORY_CARE_PROVIDER_SITE_OTHER): Payer: PPO

## 2017-06-10 ENCOUNTER — Ambulatory Visit (INDEPENDENT_AMBULATORY_CARE_PROVIDER_SITE_OTHER): Payer: PPO | Admitting: Vascular Surgery

## 2017-06-10 ENCOUNTER — Encounter (INDEPENDENT_AMBULATORY_CARE_PROVIDER_SITE_OTHER): Payer: Self-pay | Admitting: Vascular Surgery

## 2017-06-10 VITALS — BP 155/56 | HR 50 | Resp 14 | Ht 67.0 in | Wt 194.0 lb

## 2017-06-10 DIAGNOSIS — I739 Peripheral vascular disease, unspecified: Principal | ICD-10-CM

## 2017-06-10 DIAGNOSIS — I4891 Unspecified atrial fibrillation: Secondary | ICD-10-CM | POA: Diagnosis not present

## 2017-06-10 DIAGNOSIS — I10 Essential (primary) hypertension: Secondary | ICD-10-CM

## 2017-06-10 DIAGNOSIS — I2510 Atherosclerotic heart disease of native coronary artery without angina pectoris: Secondary | ICD-10-CM | POA: Diagnosis not present

## 2017-06-10 DIAGNOSIS — I6523 Occlusion and stenosis of bilateral carotid arteries: Secondary | ICD-10-CM

## 2017-06-10 DIAGNOSIS — I779 Disorder of arteries and arterioles, unspecified: Secondary | ICD-10-CM | POA: Diagnosis not present

## 2017-06-10 DIAGNOSIS — Z794 Long term (current) use of insulin: Secondary | ICD-10-CM

## 2017-06-10 DIAGNOSIS — E119 Type 2 diabetes mellitus without complications: Secondary | ICD-10-CM | POA: Diagnosis not present

## 2017-06-11 ENCOUNTER — Encounter: Payer: Self-pay | Admitting: Cardiovascular Disease

## 2017-06-11 ENCOUNTER — Ambulatory Visit: Payer: PPO | Admitting: Cardiovascular Disease

## 2017-06-11 VITALS — BP 136/60 | HR 48 | Ht 68.0 in | Wt 192.5 lb

## 2017-06-11 DIAGNOSIS — I2510 Atherosclerotic heart disease of native coronary artery without angina pectoris: Secondary | ICD-10-CM

## 2017-06-11 DIAGNOSIS — I350 Nonrheumatic aortic (valve) stenosis: Secondary | ICD-10-CM | POA: Diagnosis not present

## 2017-06-11 DIAGNOSIS — E782 Mixed hyperlipidemia: Secondary | ICD-10-CM

## 2017-06-11 DIAGNOSIS — I4819 Other persistent atrial fibrillation: Secondary | ICD-10-CM

## 2017-06-11 DIAGNOSIS — I481 Persistent atrial fibrillation: Secondary | ICD-10-CM | POA: Diagnosis not present

## 2017-06-11 NOTE — Progress Notes (Signed)
Cardiology Office Note   Date:  06/11/2017   ID:  Seth Hardy, DOB [birth year], MRN [id number]  PCP:  Rusty Aus, MD  Cardiologist:   Kathlyn [place], MD   Chief Complaint  Patient presents with  . other    6 month f/u no complaints today. Meds reviewed verbally with pt.      History of Present Illness: Seth Hardy is a 72 y.o. male who Is here today for a follow-up visit . He has a history of coronary artery disease and is status CABG in September 2011 with subsequent bare metal stenting of the right coronary artery in 2013. He also has a history of persistent atrial fibrillation maintained in sinus rhythm on small dose amiodarone and is chronically anticoagulated on Coumadin. He has other chronic medical conditions that include aortic stenosis, hypertension, hyperlipidemia, bilateral carotid arterial disease status post left carotid endarterectomy in April 2017, and diabetes.   Most recent echocardiogram in July 2018 showed normal ejection fraction with mild aortic stenosis.  Mean gradient was 13 mmHg. He has known history of iron deficiency anemia due to gastric polyp which was resected.  He has been doing very well and denies any chest pain, shortness of breath, palpitations, dizziness, syncope or presyncope.  He continues to have mild sinus bradycardia but he does not have symptoms related to that.  Past Medical History:  Diagnosis Date  . Anemia 08/2015   received 2 units rbc one week ago, 3 IV iron infusion -last 1 week.  . Basal cell carcinoma of skin 2012   removed several spots from arms and back  . CAD (coronary artery disease)    a. 01/2010 CABG x 4: LIMA->LAD, VG->D1, VG->OM1, VG->PDA. b. NSTEMI 02/2012 in setting of AF-RVR with cath s/p BMS to RCA (plan for 1 month, possibly up to 3 months of Plavix)  . Carotid disease, bilateral ([place])    a. 03/2011 U/S: 40-59% bilat Carotid dzs;  b. 04/2015 Carotid U/S: 40-59% bilat ICA stenosis; c. 05/2015 CTA Neck: 16%  RICA, 38% LICA, mod-marked R vertebral stenosis, mod L vertebral stenosis.  . Chronic kidney disease    small obtusion per pcp. ultrasound done on 08/29/2015  . Diabetes mellitus type II, controlled ([place])    a. Variable CBG 02/2012 - several meds adjusted.  Marland Kitchen Dysrhythmia    intermittent Atrial Fibrillation  . GERD (gastroesophageal reflux disease)   . Hyperlipidemia   . Hypertension   . Hypertensive heart disease   . Macular edema bil   lazer work done previously  . Mild aortic stenosis    a. 03/2011 Echo: EF 55-60%, Mild AS. b. Mild by cath 02/2012; c. 01/2014 Echo: EF 65-70%, Gr 1 DD, mild AS, mildly dil LA.  . Multiple gastric ulcers 2006  . Myocardial infarction [facility]) 2014   stents (x1) at time  . Obesity   . PAF (paroxysmal atrial fibrillation) ([place])    a. Newly dx 02/2012 & initiated on Coumadin (spont converted to NSR).  . Shortness of breath dyspnea   . Sleep apnea    CPAP settings 3 with oxygen 2.5  . Transfusion history    transfusions -1 month ago -2 units    Past Surgical History:  Procedure Laterality Date  . ANGIOPLASTY    . CARDIAC CATHETERIZATION  2014  . CATARACT EXTRACTION Bilateral 2016   Cataract Extraction with IOL  . COLONOSCOPY WITH PROPOFOL N/A 10/15/2015   Procedure: COLONOSCOPY WITH PROPOFOL;  Surgeon: Ofilia Neas  Carlean Purl, MD;  Location: [facility];  Service: Endoscopy;  Laterality: N/A;  . CORONARY ANGIOPLASTY     Dr. Fletcher Anon  . CORONARY ARTERY BYPASS GRAFT  2011   [place], [place], [place],   . cyst L kidney  [place]  . ENDARTERECTOMY Left 08/30/2015   Procedure: ENDARTERECTOMY CAROTID;  Surgeon: Katha Cabal, MD;  Location: [facility] ORS;  Service: Vascular;  Laterality: Left;  . ESOPHAGOGASTRODUODENOSCOPY (EGD) WITH PROPOFOL N/A 10/15/2015   Procedure: ESOPHAGOGASTRODUODENOSCOPY (EGD) WITH PROPOFOL;  Surgeon: Gatha Mayer, MD;  Location: [facility] ENDOS[facility];  Service: Endoscopy;  Laterality: N/A;  . INGUINAL HERNIA REPAIR Right    Right Inguinal  Hernia Repair  . LEFT HEART CATHETERIZATION WITH CORONARY ANGIOGRAM N/A 03/09/2012   Procedure: LEFT HEART CATHETERIZATION WITH CORONARY ANGIOGRAM;  Surgeon: Wellington Hampshire, MD;  Location: [facility] LAB;  Service: Cardiovascular;  Laterality: N/A;  . PERCUTANEOUS CORONARY STENT INTERVENTION (PCI-S)  03/09/2012   Procedure: PERCUTANEOUS CORONARY STENT INTERVENTION (PCI-S);  Surgeon: Wellington Hampshire, MD;  Location: [facility] CATH LAB;  Service: Cardiovascular;;     Current Outpatient Medications  Medication Sig Dispense Refill  . acetaminophen (TYLENOL) 325 MG tablet Take 650 mg by mouth every 6 (six) hours as needed. For pain    . amiodarone (PACERONE) 200 MG tablet ONE-HALF TABLET BY MOUTH AT BEDTIME 45 tablet 3  . amLODipine (NORVASC) 5 MG tablet Take one tablet by mouth one time daily 90 tablet 3  . atorvastatin (LIPITOR) 20 MG tablet TAKE ONE TABLET BY MOUTH AT BEDTIME 90 tablet 3  . Cyanocobalamin (B-12) 1000 MCG CAPS Take 1,000 mcg by mouth daily.     . furosemide (LASIX) 20 MG tablet Take 1 tablet (20 mg total) by mouth as needed for fluid or edema. 30 tablet 6  . glimepiride (AMARYL) 4 MG tablet Take 4 mg by mouth 2 (two) times daily.      . insulin glargine (LANTUS) 100 UNIT/ML injection Inject 20 Units into the skin daily.     . insulin lispro (HUMALOG) 100 UNIT/ML injection Per sliding scale. 10 mL 11  . metFORMIN (GLUCOPHAGE) 500 MG tablet Take 500 mg by mouth 2 (two) times daily with a meal.     . NITROSTAT 0.4 MG SL tablet TAKE ONE TABLET UNDER TONGUE EVERY 5 MINUTES AS NEEDED UP TO 3 DOSES 25 tablet 0  . NOVOLOG FLEXPEN 100 UNIT/ML FlexPen     . Omega-3 Fatty Acids (FISH OIL) 1200 MG CAPS Take 1,200 mg by mouth daily. Reported on 08/27/2015    . OXYGEN Inhale 2.5 L/min into the lungs at bedtime. With CPAP    . pantoprazole (PROTONIX) 40 MG tablet Take 1 tablet (40 mg total) by mouth daily. 90 tablet 3  . ramipril (ALTACE) 10 MG capsule Take 10 mg by mouth daily.     . vitamin C  (ASCORBIC ACID) 500 MG tablet Take 500 mg by mouth daily.    Marland Kitchen warfarin (COUMADIN) 5 MG tablet TAKE ONE TABLET EVERY DAY     No current facility-administered medications for this visit.     Allergies:   Patient has no known allergies.    Social History:  The patient  reports that  has never smoked. he has never used smokeless tobacco. He reports that he drinks alcohol. He reports that he does not use drugs.   Family History:  [name] family history includes Bladder Cancer in his mother; COPD in his mother; Cancer in his  other; Heart attack in his father; Kidney disease in his paternal uncle; Liver cancer in his maternal aunt; Lung cancer in his maternal aunt.    ROS:  Please see the history of present illness.   Otherwise, review of systems are positive for none.   All other systems are reviewed and negative.    PHYSICAL EXAM: VS:  BP 136/60 (BP Location: Left Arm, Patient Position: Sitting, Cuff Size: Normal)   Pulse (!) 48   Ht 5\' 8"  (1.727 m)   Wt 192 lb 8 oz (87.3 kg)   BMI 29.27 kg/m  , BMI Body mass index is 29.27 kg/m. GEN: Well nourished, well developed, in no acute distress  HEENT: normal  Neck: no JVD, carotid bruits, or masses Cardiac: RRR; no rubs, or gallops, mild bilateral leg edema . There is a 3/6 crescendo decrescendo systolic murmur in the aortic area which is mid peaking. The murmur radiates to the carotid arteries. Respiratory:  clear to auscultation bilaterally, normal work of breathing GI: soft, nontender, nondistended, + BS MS: no deformity or atrophy  Skin: warm and dry, no rash Neuro:  Strength and sensation are intact Psych: euthymic mood, full affect   EKG:  EKG  ordered today. EKG showed sinus bradycardia with anterolateral T wave changes suggestive of ischemia.   Recent Labs: 04/26/2017: BUN 16; Creatinine, Ser 0.97; Hemoglobin 12.2; Platelets 180; Potassium 4.7; Sodium 140    Lipid Panel    Component Value Date/Time   CHOL 99  03/09/2012 0243   TRIG 84 03/09/2012 0243   HDL 37 (L) 03/09/2012 0243   CHOLHDL 2.7 03/09/2012 0243   VLDL 17 03/09/2012 0243   LDLCALC 45 03/09/2012 0243      Wt Readings from Last 3 Encounters:  06/11/17 192 lb 8 oz (87.3 kg)  06/10/17 194 lb (88 kg)  05/05/17 195 lb 6.4 oz (88.6 kg)        ASSESSMENT AND PLAN:  1.  Coronary artery disease involving bypass graft without angina:  He is doing well overall with no anginal symptoms. Continue medical therapy.  2. Bilateral carotid artery stenosis: Status post  left carotid endarterectomy. Followed by Dr. Delana Meyer.  3.  Mild to moderate aortic stenosis: Echocardiogram last year showed mild aortic stenosis but by exam the murmur is suggestive of moderate stenosis.  Continue to monitor.  4. Persistent atrial fibrillation: Maintaining in sinus rhythm with small dose amiodarone.    Continue anticoagulation with warfarin.  I reviewed his labs from October which were unremarkable including liver profile and TSH.  5. Hyperlipidemia: Continue treatment with atorvastatin .  I reviewed lipid profile from last year showed an LDL below 70.  Disposition:   FU with me in 6 months  Signed,  Kathlyn [place], MD  06/11/2017 11:21 AM    [place]

## 2017-06-11 NOTE — Patient Instructions (Signed)
Medication Instructions: Continue same medications.   Labwork: None.   Procedures/Testing: None.   Follow-Up: 6 months with Dr. [name].   Any Additional Special Instructions Will Be Listed Below (If Applicable).     If you need a refill on your cardiac medications before your next appointment, please call your pharmacy.   

## 2017-06-12 ENCOUNTER — Encounter (INDEPENDENT_AMBULATORY_CARE_PROVIDER_SITE_OTHER): Payer: Self-pay | Admitting: Vascular Surgery

## 2017-06-21 ENCOUNTER — Ambulatory Visit (INDEPENDENT_AMBULATORY_CARE_PROVIDER_SITE_OTHER): Payer: PPO

## 2017-06-21 DIAGNOSIS — I481 Persistent atrial fibrillation: Secondary | ICD-10-CM | POA: Diagnosis not present

## 2017-06-21 DIAGNOSIS — Z7901 Long term (current) use of anticoagulants: Secondary | ICD-10-CM | POA: Diagnosis not present

## 2017-06-21 DIAGNOSIS — I4891 Unspecified atrial fibrillation: Secondary | ICD-10-CM

## 2017-06-21 DIAGNOSIS — I4819 Other persistent atrial fibrillation: Secondary | ICD-10-CM

## 2017-06-21 LAB — POCT INR: INR: 2.8

## 2017-06-21 NOTE — Patient Instructions (Signed)
Continue taking 1 tablet daily except 1/2 tablet on Tuesdays and Saturdays.   Recheck INR in 6 weeks.

## 2017-06-22 DIAGNOSIS — G4733 Obstructive sleep apnea (adult) (pediatric): Secondary | ICD-10-CM | POA: Diagnosis not present

## 2017-06-22 DIAGNOSIS — I2510 Atherosclerotic heart disease of native coronary artery without angina pectoris: Secondary | ICD-10-CM | POA: Diagnosis not present

## 2017-06-22 DIAGNOSIS — R011 Cardiac murmur, unspecified: Secondary | ICD-10-CM | POA: Diagnosis not present

## 2017-06-22 DIAGNOSIS — R0689 Other abnormalities of breathing: Secondary | ICD-10-CM | POA: Diagnosis not present

## 2017-07-02 IMAGING — CR DG CHEST 2V
1 series · 2 of 2 positions shown · non-contrast
Comparison: Portable chest x-ray dated January 21, 2013

CLINICAL DATA: Preoperative examination. History of MI and CABG,
diabetes aortic stenosis, atrial fibrillation, nonsmoker.

EXAM:
CHEST  2 VIEW

[Series 1: dg chest 2 view · 0.14mm/px · 2 of 2 slices shown]
[im 1/2]
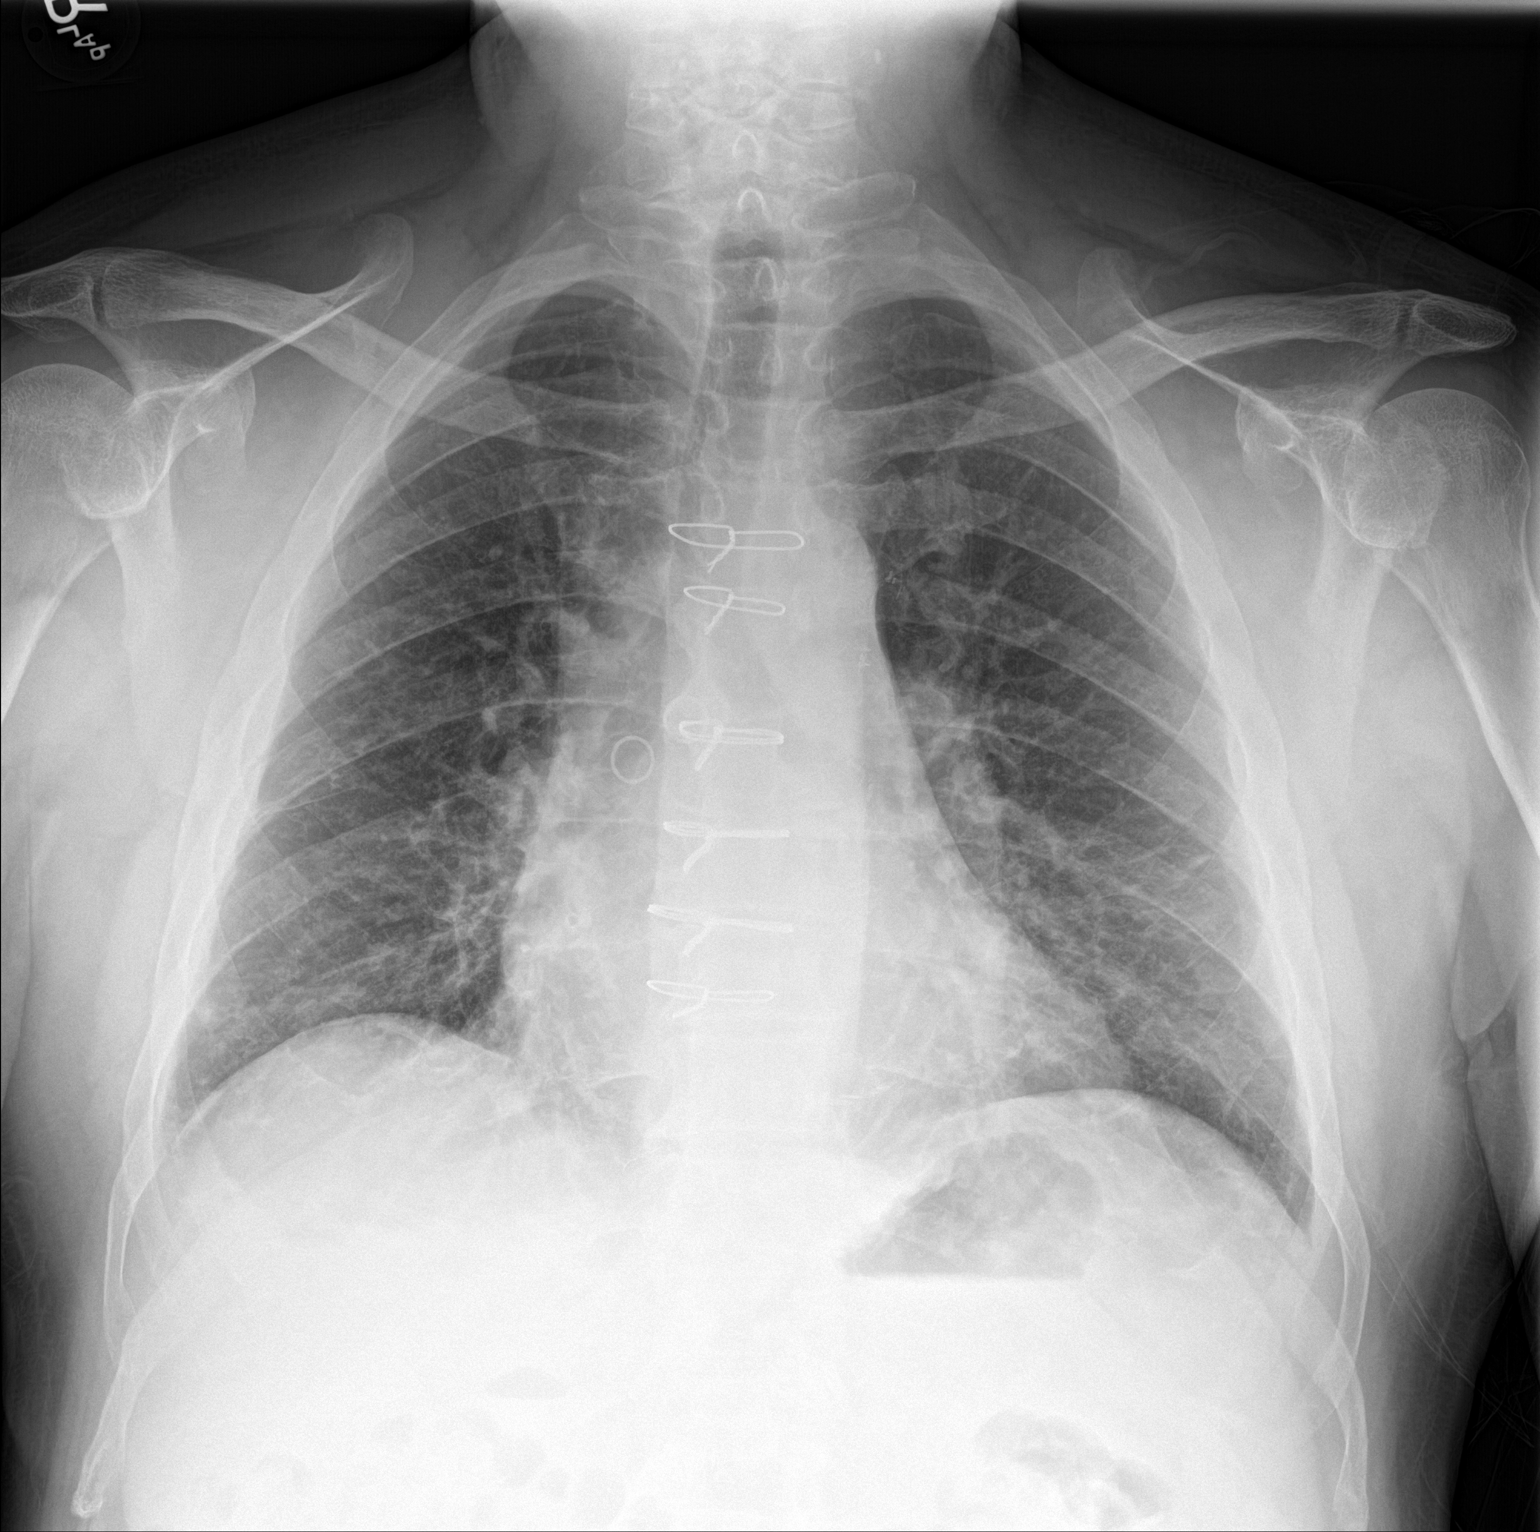
[im 2/2]
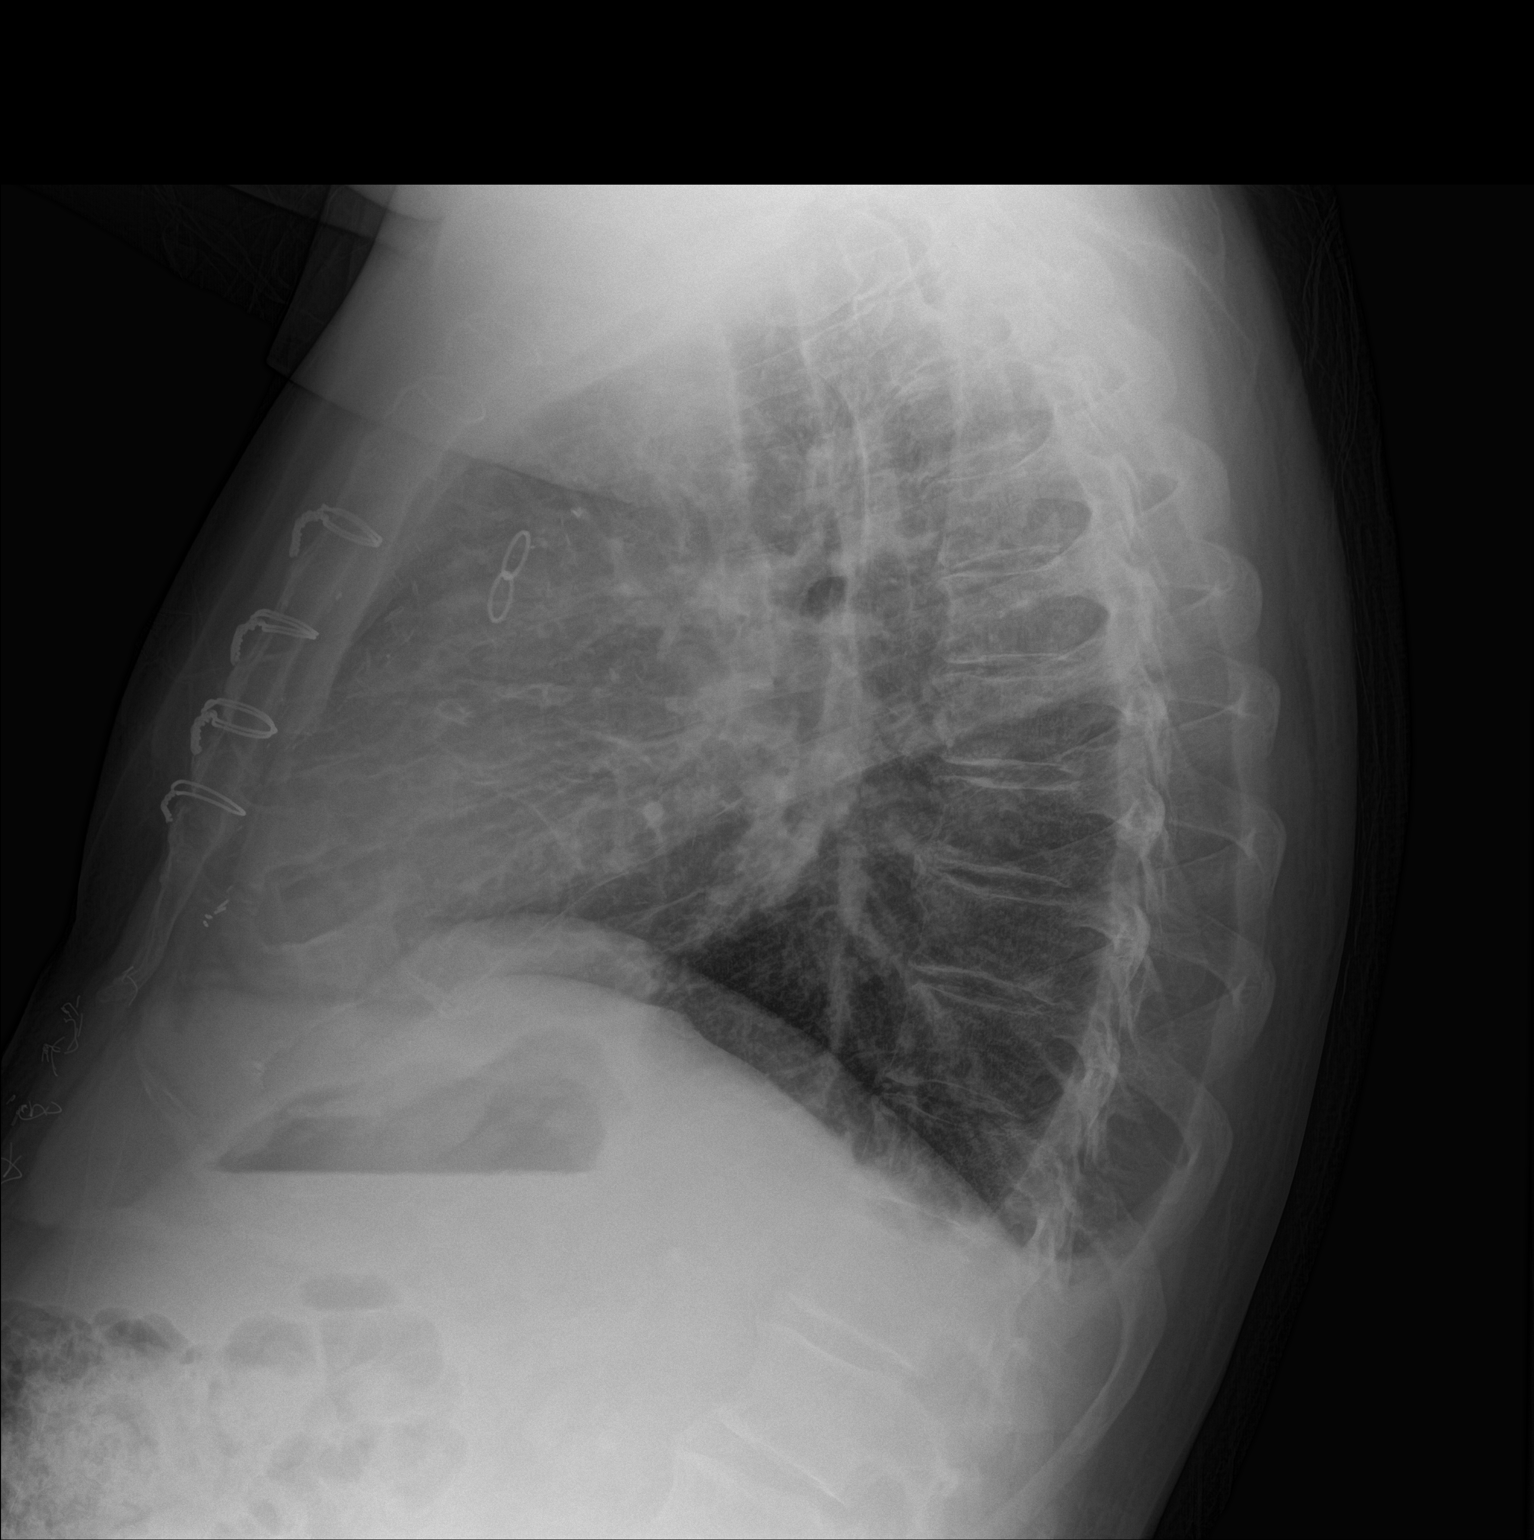

[2 of 2 positions shown; findings below may reference images not displayed]

FINDINGS: The lungs are adequately inflated. The interstitial markings are
increased bilaterally. There is no alveolar infiltrate. There is
mild blunting of the lateral and posterior costophrenic angles. The
heart is top-normal in size. The central pulmonary vascularity is
mildly prominent. The sternal wires are intact. The mediastinum is
normal in width. There is mild stable thickening of the retrosternal
soft tissues.
IMPRESSION: Mildly increased interstitial markings suggest mild interstitial
edema secondary to low-grade CHF. There are small bilateral pleural
effusions blunting the costophrenic angles. There is no alveolar
pneumonia.

## 2017-07-22 DIAGNOSIS — R0689 Other abnormalities of breathing: Secondary | ICD-10-CM | POA: Diagnosis not present

## 2017-07-22 DIAGNOSIS — I2510 Atherosclerotic heart disease of native coronary artery without angina pectoris: Secondary | ICD-10-CM | POA: Diagnosis not present

## 2017-07-22 DIAGNOSIS — G4733 Obstructive sleep apnea (adult) (pediatric): Secondary | ICD-10-CM | POA: Diagnosis not present

## 2017-07-22 DIAGNOSIS — R011 Cardiac murmur, unspecified: Secondary | ICD-10-CM | POA: Diagnosis not present

## 2017-07-26 ENCOUNTER — Emergency Department: Payer: PPO

## 2017-07-26 ENCOUNTER — Other Ambulatory Visit: Payer: Self-pay

## 2017-07-26 ENCOUNTER — Encounter: Payer: Self-pay | Admitting: Emergency Medicine

## 2017-07-26 ENCOUNTER — Emergency Department
Admission: EM | Admit: 2017-07-26 | Discharge: 2017-07-26 | Disposition: A | Discharge status: Home / Self Care | Payer: PPO | Attending: Emergency Medicine | Admitting: Emergency Medicine

## 2017-07-26 DIAGNOSIS — Z7901 Long term (current) use of anticoagulants: Secondary | ICD-10-CM | POA: Diagnosis not present

## 2017-07-26 DIAGNOSIS — I252 Old myocardial infarction: Secondary | ICD-10-CM | POA: Insufficient documentation

## 2017-07-26 DIAGNOSIS — I2510 Atherosclerotic heart disease of native coronary artery without angina pectoris: Secondary | ICD-10-CM | POA: Insufficient documentation

## 2017-07-26 DIAGNOSIS — I129 Hypertensive chronic kidney disease with stage 1 through stage 4 chronic kidney disease, or unspecified chronic kidney disease: Secondary | ICD-10-CM | POA: Insufficient documentation

## 2017-07-26 DIAGNOSIS — Z79899 Other long term (current) drug therapy: Secondary | ICD-10-CM | POA: Insufficient documentation

## 2017-07-26 DIAGNOSIS — R1032 Left lower quadrant pain: Secondary | ICD-10-CM | POA: Insufficient documentation

## 2017-07-26 DIAGNOSIS — N189 Chronic kidney disease, unspecified: Secondary | ICD-10-CM | POA: Diagnosis not present

## 2017-07-26 DIAGNOSIS — E1122 Type 2 diabetes mellitus with diabetic chronic kidney disease: Secondary | ICD-10-CM | POA: Insufficient documentation

## 2017-07-26 DIAGNOSIS — Z794 Long term (current) use of insulin: Secondary | ICD-10-CM | POA: Diagnosis not present

## 2017-07-26 DIAGNOSIS — Z85828 Personal history of other malignant neoplasm of skin: Secondary | ICD-10-CM | POA: Diagnosis not present

## 2017-07-26 DIAGNOSIS — Z951 Presence of aortocoronary bypass graft: Secondary | ICD-10-CM | POA: Insufficient documentation

## 2017-07-26 LAB — URINALYSIS, COMPLETE (UACMP) WITH MICROSCOPIC
Bacteria, UA: NONE SEEN
Bilirubin Urine: NEGATIVE
Glucose, UA: 150 mg/dL — AB
Hgb urine dipstick: NEGATIVE
Ketones, ur: NEGATIVE mg/dL
Leukocytes, UA: NEGATIVE
Nitrite: NEGATIVE
Protein, ur: NEGATIVE mg/dL
Specific Gravity, Urine: 1.011 (ref 1.005–1.030)
Squamous Epithelial / LPF: NONE SEEN
WBC, UA: NONE SEEN WBC/hpf (ref 0–5)
pH: 6 (ref 5.0–8.0)

## 2017-07-26 LAB — COMPREHENSIVE METABOLIC PANEL
ALT: 28 U/L (ref 17–63)
AST: 37 U/L (ref 15–41)
Albumin: 4.9 g/dL (ref 3.5–5.0)
Alkaline Phosphatase: 116 U/L (ref 38–126)
Anion gap: 12 (ref 5–15)
BUN: 14 mg/dL (ref 6–20)
CO2: 23 mmol/L (ref 22–32)
Calcium: 9.2 mg/dL (ref 8.9–10.3)
Chloride: 99 mmol/L — ABNORMAL LOW (ref 101–111)
Creatinine, Ser: 1.06 mg/dL (ref 0.61–1.24)
GFR calc Af Amer: >60 mL/min (ref >60)
GFR calc non Af Amer: >60 mL/min (ref >60)
Glucose, Bld: 243 mg/dL — ABNORMAL HIGH (ref 65–99)
Potassium: 4 mmol/L (ref 3.5–5.1)
Sodium: 134 mmol/L — ABNORMAL LOW (ref 135–145)
Total Bilirubin: 1 mg/dL (ref 0.3–1.2)
Total Protein: 8.1 g/dL (ref 6.5–8.1)

## 2017-07-26 LAB — CBC
HCT: 39.6 % — ABNORMAL LOW (ref 40.0–52.0)
Hemoglobin: 13 g/dL (ref 13.0–18.0)
MCH: 30.4 pg (ref 26.0–34.0)
MCHC: 32.9 g/dL (ref 32.0–36.0)
MCV: 92.4 fL (ref 80.0–100.0)
Platelets: 220 10*3/uL (ref 150–440)
RBC: 4.29 MIL/uL — ABNORMAL LOW (ref 4.40–5.90)
RDW: 13.6 % (ref 11.5–14.5)
WBC: 4.4 10*3/uL (ref 3.8–10.6)

## 2017-07-26 LAB — LIPASE, BLOOD: Lipase: 29 U/L (ref 11–51)

## 2017-07-26 MED ORDER — TRAMADOL HCL 50 MG PO TABS: 50.0000 mg | ORAL_TABLET | Freq: Four times a day (QID) | ORAL | 0 refills | Status: DC | PRN

## 2017-07-26 NOTE — ED Triage Notes (Signed)
L lower abd pain began 20 min ago.

## 2017-07-26 NOTE — ED Notes (Signed)
Patient transported to CT 

## 2017-07-26 NOTE — ED Provider Notes (Signed)
[facility] Emergency Department Provider Note   ____________________________________________    I have reviewed the triage vital signs and the nursing notes.   HISTORY  Chief Complaint Abdominal Pain     HPI Seth Hardy is a 72 y.o. male with history of coronary artery disease, CABG, diabetes who presents with left lower quadrant intermittent sharp pain.  Initially was a 7, but as the patient is waiting in the emergency department has improved significantly.  Currently is about a 3 and seems to come and go.  Does have a history of abdominal surgery when he was in high school regarding a kidney cyst.  Denies nausea or vomiting.  No history of hernias.  Has not taken anything for this.  No dysuria or hematuria.  No history of kidney stones   Past Medical History:  Diagnosis Date  . Anemia 08/2015   received 2 units rbc one week ago, 3 IV iron infusion -last 1 week.  . Basal cell carcinoma of skin 2012   removed several spots from arms and back  . CAD (coronary artery disease)    a. 01/2010 CABG x 4: LIMA->LAD, VG->D1, VG->OM1, VG->PDA. b. NSTEMI 02/2012 in setting of AF-RVR with cath s/p BMS to RCA (plan for 1 month, possibly up to 3 months of Plavix)  . Carotid disease, bilateral ([place])    a. 03/2011 U/S: 40-59% bilat Carotid dzs;  b. 04/2015 Carotid U/S: 40-59% bilat ICA stenosis; c. 05/2015 CTA Neck: 82% RICA, 42% LICA, mod-marked R vertebral stenosis, mod L vertebral stenosis.  . Chronic kidney disease    small obtusion per pcp. ultrasound done on 08/29/2015  . Diabetes mellitus type II, controlled ([place])    a. Variable CBG 02/2012 - several meds adjusted.  Marland Kitchen Dysrhythmia    intermittent Atrial Fibrillation  . GERD (gastroesophageal reflux disease)   . Hyperlipidemia   . Hypertension   . Hypertensive heart disease   . Macular edema bil   lazer work done previously  . Mild aortic stenosis    a. 03/2011 Echo: EF 55-60%, Mild AS. b. Mild by cath  02/2012; c. 01/2014 Echo: EF 65-70%, Gr 1 DD, mild AS, mildly dil LA.  . Multiple gastric ulcers 2006  . Myocardial infarction [facility]) 2014   stents (x1) at time  . Obesity   . PAF (paroxysmal atrial fibrillation) ([place])    a. Newly dx 02/2012 & initiated on Coumadin (spont converted to NSR).  . Shortness of breath dyspnea   . Sleep apnea    CPAP settings 3 with oxygen 2.5  . Transfusion history    transfusions -1 month ago -2 units    Patient Active Problem List   Diagnosis Date Noted  . Benign essential hypertension 02/11/2016  . Microcytic anemia   . Gastric polyp   . BP (high blood pressure) 09/25/2015  . Aortic heart valve narrowing 09/23/2015  . Carotid artery stenosis with cerebral infarction ([place]) 08/30/2015  . Iron deficiency anemia due to chronic blood loss 08/21/2015  . B12 deficiency 08/19/2015  . Carotid disease, bilateral ([place])   . CAD (coronary artery disease)   . Mild aortic stenosis   . Hypertensive heart disease   . Diabetes mellitus type II, controlled ([place])   . Hyperlipidemia 03/14/2015  . Carotid stenosis, asymptomatic, left 09/13/2014  . 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 History of atrial fibrillation 01/04/2014  . Obstructive apnea 01/04/2014  . Atrial fibrillation ([place])  03/23/2012  . Long term (current) use of anticoagulants 03/14/2012  . Non-STEMI (non-ST elevated myocardial infarction) ([place]) 03/09/2012  . Carotid stenosis 05/28/2010  . CAD, ARTERY BYPASS GRAFT 04/07/2010  . CAD, NATIVE VESSEL 03/13/2009  . Aortic valve stenosis, mild 11/01/2008    Past Surgical History:  Procedure Laterality Date  . ANGIOPLASTY    . CARDIAC CATHETERIZATION  2014  . CATARACT EXTRACTION Bilateral 2016   Cataract Extraction with IOL  . COLONOSCOPY WITH PROPOFOL N/A 10/15/2015   Procedure: COLONOSCOPY WITH PROPOFOL;  Surgeon: Gatha Mayer, MD;  [facility]: [facility] ENDOSCOPY;  Service: Endoscopy;  Laterality: N/A;  .  CORONARY ANGIOPLASTY     Dr. Fletcher Anon  . CORONARY ARTERY BYPASS GRAFT  2011   [place], [place], [place],   . cyst L kidney  [place]  . ENDARTERECTOMY Left 08/30/2015   Procedure: ENDARTERECTOMY CAROTID;  Surgeon: Katha Cabal, MD;  Location: [facility] ORS;  Service: Vascular;  Laterality: Left;  . ESOPHAGOGASTRODUODENOSCOPY (EGD) WITH PROPOFOL N/A 10/15/2015   Procedure: ESOPHAGOGASTRODUODENOSCOPY (EGD) WITH PROPOFOL;  Surgeon: Gatha Mayer, MD;  [facility]: [facility] ENDOSCOPY;  Service: Endoscopy;  Laterality: N/A;  . INGUINAL HERNIA REPAIR Right    Right Inguinal Hernia Repair  . LEFT HEART CATHETERIZATION WITH CORONARY ANGIOGRAM N/A 03/09/2012   Procedure: LEFT HEART CATHETERIZATION WITH CORONARY ANGIOGRAM;  Surgeon: Wellington Hampshire, MD;  Location: [facility] CATH LAB;  Service: Cardiovascular;  Laterality: N/A;  . PERCUTANEOUS CORONARY STENT INTERVENTION (PCI-S)  03/09/2012   Procedure: PERCUTANEOUS CORONARY STENT INTERVENTION (PCI-S);  Surgeon: Wellington Hampshire, MD;  Location: [facility] CATH LAB;  Service: Cardiovascular;;    Prior to Admission medications   Medication Sig Start Date End Date Taking? Authorizing Provider  acetaminophen (TYLENOL) 325 MG tablet Take 650 mg by mouth every 6 (six) hours as needed. For pain    [provider]  amiodarone (PACERONE) 200 MG tablet ONE-HALF TABLET BY MOUTH AT BEDTIME 01/04/17   Wellington Hampshire, MD  amLODipine (NORVASC) 5 MG tablet Take one tablet by mouth one time daily 07/25/14   Wellington Hampshire, MD  atorvastatin (LIPITOR) 20 MG tablet TAKE ONE TABLET BY MOUTH AT BEDTIME 01/04/17   Wellington Hampshire, MD  Cyanocobalamin (B-12) 1000 MCG CAPS Take 1,000 mcg by mouth daily.     [provider]  furosemide (LASIX) 20 MG tablet Take 1 tablet (20 mg total) by mouth as needed for fluid or edema. 10/15/15   Gatha Mayer, MD  glimepiride (AMARYL) 4 MG tablet Take 4 mg by mouth 2 (two) times daily.      [provider]  insulin glargine  (LANTUS) 100 UNIT/ML injection Inject 20 Units into the skin daily.     [provider]  insulin lispro (HUMALOG) 100 UNIT/ML injection Per sliding scale. 10/15/15   Gatha Mayer, MD  metFORMIN (GLUCOPHAGE) 500 MG tablet Take 500 mg by mouth 2 (two) times daily with a meal.  09/17/15   [provider]  NITROSTAT 0.4 MG SL tablet TAKE ONE TABLET UNDER TONGUE EVERY 5 MINUTES AS NEEDED UP TO 3 DOSES 12/10/16   Wellington Hampshire, MD  NOVOLOG FLEXPEN 100 UNIT/ML FlexPen  05/26/17   [provider]  Omega-3 Fatty Acids (FISH OIL) 1200 MG CAPS Take 1,200 mg by mouth daily. Reported on 08/27/2015    [provider]  OXYGEN Inhale 2.5 L/min into the lungs at bedtime. With CPAP    [provider]  pantoprazole (  PROTONIX) 40 MG tablet Take 1 tablet (40 mg total) by mouth daily. 06/11/14   Wellington Hampshire, MD  ramipril (ALTACE) 10 MG capsule Take 10 mg by mouth daily.  09/17/15   [provider]  vitamin C (ASCORBIC ACID) 500 MG tablet Take 500 mg by mouth daily.    [provider]  warfarin (COUMADIN) 5 MG tablet TAKE ONE TABLET EVERY DAY 07/17/16   [provider]     Allergies Patient has no known allergies.  Family History  Problem Relation Age of Onset  . COPD Mother        alive 50  . Bladder Cancer Mother   . Heart attack Father        2013-09-14 deceased  . Lung cancer Maternal Aunt   . Liver cancer Maternal Aunt   . Kidney disease Paternal Uncle   . Cancer Other        all paternal aunts and uncles  . Prostate cancer Neg Hx   . Kidney cancer Neg Hx     Social History Social History   Tobacco Use  . Smoking status: Never Smoker  . Smokeless tobacco: Never Used  . Tobacco comment: tobacco use - no.no passive smoke in home  Substance Use Topics  . Alcohol use: Yes    Comment: rare drink  . Drug use: No    Review of Systems  Constitutional: No fever/chills Eyes: No visual changes.  ENT: No sore  throat. Cardiovascular: Denies chest pain. Respiratory: Denies shortness of breath. Gastrointestinal:   No nausea, no vomiting.   Genitourinary: Negative for dysuria.  No hematuria Musculoskeletal: Negative for back pain. Skin: Negative for rash. Neurological: Negative for headaches or weakness   ____________________________________________   PHYSICAL EXAM:  VITAL SIGNS: ED Triage Vitals  Enc Vitals Group     BP 07/26/17 1337 (!) 199/49     Pulse Rate 07/26/17 1337 (!) 57     Resp 07/26/17 1337 20     Temp 07/26/17 1337 97.9 F (36.6 C)     Temp Source 07/26/17 1337 Oral     SpO2 07/26/17 1337 97 %     Weight 07/26/17 1338 86.2 kg (190 lb)     Height 07/26/17 1338 1.702 m (5\' 7" )     Head Circumference --      Peak Flow --      Pain Score 07/26/17 1338 7     Pain Loc --      Pain Edu? --      Excl. in [place]? --     Constitutional: Alert and oriented. No acute distress. Pleasant and interactive Eyes: Conjunctivae are normal.   Nose: No congestion/rhinnorhea. Mouth/Throat: Mucous membranes are moist.    Cardiovascular: Normal rate, regular rhythm. Grossly normal heart sounds.  Good peripheral circulation. Respiratory: Normal respiratory effort.  No retractions. Lungs CTAB. Gastrointestinal: Soft and nontender. No distention.  No  Genitourinary: Possible left inguinal hernia felt on the patient coughs, no significant tenderness palpation left groin Musculoskeletal: No lower extremity tenderness nor edema.  Warm and well perfused Neurologic:  Normal speech and language. No gross focal neurologic deficits are appreciated.  Skin:  Skin is warm, dry and intact. No rash noted. Psychiatric: Mood and affect are normal. Speech and behavior are normal.  ____________________________________________   LABS (all labs ordered are listed, but only abnormal results are displayed)  Labs Reviewed  COMPREHENSIVE METABOLIC PANEL - Abnormal; Notable for the following components:  Result Value   Sodium 134 (*)    Chloride 99 (*)    Glucose, Bld 243 (*)    All other components within normal limits  CBC - Abnormal; Notable for the following components:   RBC 4.29 (*)    HCT 39.6 (*)    All other components within normal limits  URINALYSIS, COMPLETE (UACMP) WITH MICROSCOPIC - Abnormal; Notable for the following components:   Color, Urine YELLOW (*)    APPearance CLEAR (*)    Glucose, UA 150 (*)    All other components within normal limits  LIPASE, BLOOD   ____________________________________________  EKG   ____________________________________________  RADIOLOGY  CT renal stone study ____________________________________________   PROCEDURES  Procedure(s) performed: No  Procedures   Critical Care performed: No ____________________________________________   INITIAL IMPRESSION / ASSESSMENT AND PLAN / ED COURSE  Pertinent labs & imaging results that were available during my care of the patient were reviewed by me and considered in my medical decision making (see chart for details).  Patient well-appearing in no acute distress.  Differential diagnosis includes ureterolithiasis, hernia, diverticulitis, muscular skeletal pain.  His pain is quite mild now he does not request any pain medications.  We will obtain CT renal stone study to evaluate further, lab work is reassuring.      ____________________________________________   FINAL CLINICAL IMPRESSION(S) / ED DIAGNOSES  Final diagnoses:  Left lower quadrant pain        Note:  This document was prepared using Dragon voice recognition software and may include unintentional dictation errors.    Lavonia Drafts, MD 07/26/17 1705

## 2017-07-26 NOTE — Discharge Instructions (Signed)

## 2017-07-26 NOTE — ED Provider Notes (Signed)
Patient received in sign out from Dr. Wynona Neat.  Workup pending CT imaging CT renal stone shows no evidence of stone or acute intra-abdominal process.  Repeat abdominal exam is soft and benign.  Patient well-appearing in no acute distress.  Possibly secondary to hernia or small submillimeter stone.  Regardless patient is stable and appropriate for outpatient follow-up.   Merlyn Lot, MD 07/26/17 [phone]

## 2017-08-02 ENCOUNTER — Inpatient Hospital Stay: Payer: PPO | Attending: Internal Medicine

## 2017-08-02 ENCOUNTER — Ambulatory Visit (INDEPENDENT_AMBULATORY_CARE_PROVIDER_SITE_OTHER): Payer: PPO

## 2017-08-02 DIAGNOSIS — I481 Persistent atrial fibrillation: Secondary | ICD-10-CM | POA: Diagnosis not present

## 2017-08-02 DIAGNOSIS — Z7982 Long term (current) use of aspirin: Secondary | ICD-10-CM | POA: Insufficient documentation

## 2017-08-02 DIAGNOSIS — Z79899 Other long term (current) drug therapy: Secondary | ICD-10-CM | POA: Insufficient documentation

## 2017-08-02 DIAGNOSIS — Z85828 Personal history of other malignant neoplasm of skin: Secondary | ICD-10-CM | POA: Insufficient documentation

## 2017-08-02 DIAGNOSIS — Z8673 Personal history of transient ischemic attack (TIA), and cerebral infarction without residual deficits: Secondary | ICD-10-CM | POA: Insufficient documentation

## 2017-08-02 DIAGNOSIS — Z800 Family history of malignant neoplasm of digestive organs: Secondary | ICD-10-CM | POA: Diagnosis not present

## 2017-08-02 DIAGNOSIS — K219 Gastro-esophageal reflux disease without esophagitis: Secondary | ICD-10-CM | POA: Insufficient documentation

## 2017-08-02 DIAGNOSIS — I480 Paroxysmal atrial fibrillation: Secondary | ICD-10-CM | POA: Insufficient documentation

## 2017-08-02 DIAGNOSIS — Z8052 Family history of malignant neoplasm of bladder: Secondary | ICD-10-CM | POA: Insufficient documentation

## 2017-08-02 DIAGNOSIS — I129 Hypertensive chronic kidney disease with stage 1 through stage 4 chronic kidney disease, or unspecified chronic kidney disease: Secondary | ICD-10-CM | POA: Diagnosis not present

## 2017-08-02 DIAGNOSIS — N189 Chronic kidney disease, unspecified: Secondary | ICD-10-CM | POA: Insufficient documentation

## 2017-08-02 DIAGNOSIS — I252 Old myocardial infarction: Secondary | ICD-10-CM | POA: Insufficient documentation

## 2017-08-02 DIAGNOSIS — I4891 Unspecified atrial fibrillation: Secondary | ICD-10-CM | POA: Insufficient documentation

## 2017-08-02 DIAGNOSIS — E785 Hyperlipidemia, unspecified: Secondary | ICD-10-CM | POA: Insufficient documentation

## 2017-08-02 DIAGNOSIS — Z5181 Encounter for therapeutic drug level monitoring: Secondary | ICD-10-CM | POA: Diagnosis not present

## 2017-08-02 DIAGNOSIS — E669 Obesity, unspecified: Secondary | ICD-10-CM | POA: Insufficient documentation

## 2017-08-02 DIAGNOSIS — Z8711 Personal history of peptic ulcer disease: Secondary | ICD-10-CM | POA: Diagnosis not present

## 2017-08-02 DIAGNOSIS — D500 Iron deficiency anemia secondary to blood loss (chronic): Secondary | ICD-10-CM | POA: Diagnosis not present

## 2017-08-02 DIAGNOSIS — Z7901 Long term (current) use of anticoagulants: Secondary | ICD-10-CM | POA: Insufficient documentation

## 2017-08-02 DIAGNOSIS — R0602 Shortness of breath: Secondary | ICD-10-CM | POA: Insufficient documentation

## 2017-08-02 DIAGNOSIS — E1122 Type 2 diabetes mellitus with diabetic chronic kidney disease: Secondary | ICD-10-CM | POA: Insufficient documentation

## 2017-08-02 DIAGNOSIS — G4730 Sleep apnea, unspecified: Secondary | ICD-10-CM | POA: Insufficient documentation

## 2017-08-02 DIAGNOSIS — I4819 Other persistent atrial fibrillation: Secondary | ICD-10-CM

## 2017-08-02 DIAGNOSIS — I2510 Atherosclerotic heart disease of native coronary artery without angina pectoris: Secondary | ICD-10-CM | POA: Insufficient documentation

## 2017-08-02 LAB — CBC WITH DIFFERENTIAL/PLATELET
Basophils Absolute: 0 10*3/uL (ref 0–0.1)
Basophils Relative: 1 %
Eosinophils Absolute: 0.1 10*3/uL (ref 0–0.7)
Eosinophils Relative: 3 %
HCT: 35.7 % — ABNORMAL LOW (ref 40.0–52.0)
Hemoglobin: 11.9 g/dL — ABNORMAL LOW (ref 13.0–18.0)
Lymphocytes Relative: 27 %
Lymphs Abs: 1 10*3/uL (ref 1.0–3.6)
MCH: 30.5 pg (ref 26.0–34.0)
MCHC: 33.3 g/dL (ref 32.0–36.0)
MCV: 91.7 fL (ref 80.0–100.0)
Monocytes Absolute: 0.5 10*3/uL (ref 0.2–1.0)
Monocytes Relative: 14 %
Neutro Abs: 2 10*3/uL (ref 1.4–6.5)
Neutrophils Relative %: 55 %
Platelets: 202 10*3/uL (ref 150–440)
RBC: 3.89 MIL/uL — ABNORMAL LOW (ref 4.40–5.90)
RDW: 13.6 % (ref 11.5–14.5)
WBC: 3.6 10*3/uL — ABNORMAL LOW (ref 3.8–10.6)

## 2017-08-02 LAB — COMPREHENSIVE METABOLIC PANEL
ALT: 28 U/L (ref 17–63)
AST: 29 U/L (ref 15–41)
Albumin: 4 g/dL (ref 3.5–5.0)
Alkaline Phosphatase: 113 U/L (ref 38–126)
Anion gap: 9 (ref 5–15)
BUN: 19 mg/dL (ref 6–20)
CO2: 23 mmol/L (ref 22–32)
Calcium: 8.8 mg/dL — ABNORMAL LOW (ref 8.9–10.3)
Chloride: 104 mmol/L (ref 101–111)
Creatinine, Ser: 1.09 mg/dL (ref 0.61–1.24)
GFR calc Af Amer: >60 mL/min (ref >60)
GFR calc non Af Amer: >60 mL/min (ref >60)
Glucose, Bld: 180 mg/dL — ABNORMAL HIGH (ref 65–99)
Potassium: 4.9 mmol/L (ref 3.5–5.1)
Sodium: 136 mmol/L (ref 135–145)
Total Bilirubin: 0.6 mg/dL (ref 0.3–1.2)
Total Protein: 7 g/dL (ref 6.5–8.1)

## 2017-08-02 LAB — IRON AND TIBC
Iron: 99 ug/dL (ref 45–182)
Saturation Ratios: 24 % (ref 17.9–39.5)
TIBC: 421 ug/dL (ref 250–450)
UIBC: 322 ug/dL

## 2017-08-02 LAB — FERRITIN: Ferritin: 10 ng/mL — ABNORMAL LOW (ref 24–336)

## 2017-08-02 LAB — POCT INR: INR: 3.1

## 2017-08-02 NOTE — Patient Instructions (Signed)
Please have a large serving of greens today and continue taking 1 tablet daily except 1/2 tablet on Tuesdays and Saturdays.   If you can't have a salad today, only take a 1/2 dose tonight.  Recheck INR in 6 weeks.

## 2017-08-04 ENCOUNTER — Inpatient Hospital Stay (HOSPITAL_BASED_OUTPATIENT_CLINIC_OR_DEPARTMENT_OTHER): Payer: PPO | Admitting: Internal Medicine

## 2017-08-04 ENCOUNTER — Encounter: Payer: Self-pay | Admitting: Internal Medicine

## 2017-08-04 ENCOUNTER — Inpatient Hospital Stay: Payer: PPO

## 2017-08-04 VITALS — BP 160/67 | HR 47 | Temp 97.9°F | Resp 16

## 2017-08-04 VITALS — BP 164/71 | HR 60 | Resp 18

## 2017-08-04 DIAGNOSIS — E669 Obesity, unspecified: Secondary | ICD-10-CM | POA: Diagnosis not present

## 2017-08-04 DIAGNOSIS — D500 Iron deficiency anemia secondary to blood loss (chronic): Secondary | ICD-10-CM

## 2017-08-04 DIAGNOSIS — K219 Gastro-esophageal reflux disease without esophagitis: Secondary | ICD-10-CM | POA: Diagnosis not present

## 2017-08-04 DIAGNOSIS — Z79899 Other long term (current) drug therapy: Secondary | ICD-10-CM | POA: Diagnosis not present

## 2017-08-04 DIAGNOSIS — R0602 Shortness of breath: Secondary | ICD-10-CM | POA: Diagnosis not present

## 2017-08-04 DIAGNOSIS — I129 Hypertensive chronic kidney disease with stage 1 through stage 4 chronic kidney disease, or unspecified chronic kidney disease: Secondary | ICD-10-CM | POA: Diagnosis not present

## 2017-08-04 DIAGNOSIS — I480 Paroxysmal atrial fibrillation: Secondary | ICD-10-CM

## 2017-08-04 DIAGNOSIS — Z7982 Long term (current) use of aspirin: Secondary | ICD-10-CM

## 2017-08-04 DIAGNOSIS — Z7901 Long term (current) use of anticoagulants: Secondary | ICD-10-CM | POA: Diagnosis not present

## 2017-08-04 DIAGNOSIS — E1122 Type 2 diabetes mellitus with diabetic chronic kidney disease: Secondary | ICD-10-CM | POA: Diagnosis not present

## 2017-08-04 DIAGNOSIS — I2510 Atherosclerotic heart disease of native coronary artery without angina pectoris: Secondary | ICD-10-CM | POA: Diagnosis not present

## 2017-08-04 DIAGNOSIS — I4891 Unspecified atrial fibrillation: Secondary | ICD-10-CM

## 2017-08-04 DIAGNOSIS — Z8711 Personal history of peptic ulcer disease: Secondary | ICD-10-CM

## 2017-08-04 DIAGNOSIS — G4730 Sleep apnea, unspecified: Secondary | ICD-10-CM

## 2017-08-04 DIAGNOSIS — N189 Chronic kidney disease, unspecified: Secondary | ICD-10-CM

## 2017-08-04 DIAGNOSIS — E785 Hyperlipidemia, unspecified: Secondary | ICD-10-CM | POA: Diagnosis not present

## 2017-08-04 DIAGNOSIS — I252 Old myocardial infarction: Secondary | ICD-10-CM

## 2017-08-04 DIAGNOSIS — Z85828 Personal history of other malignant neoplasm of skin: Secondary | ICD-10-CM

## 2017-08-04 DIAGNOSIS — Z8673 Personal history of transient ischemic attack (TIA), and cerebral infarction without residual deficits: Secondary | ICD-10-CM

## 2017-08-04 MED ORDER — SODIUM CHLORIDE 0.9 % IV SOLN
510.0000 mg | Freq: Once | INTRAVENOUS | Status: AC
  Administered 2017-08-04: 510 mg via INTRAVENOUS
  Filled 2017-08-04: qty 17

## 2017-08-04 MED ORDER — SODIUM CHLORIDE 0.9 % IV SOLN
Freq: Once | INTRAVENOUS | Status: AC
Start: 2017-08-04 — End: 2017-08-04
  Administered 2017-08-04: 12:00:00 via INTRAVENOUS
  Filled 2017-08-04: qty 1000

## 2017-08-04 NOTE — Progress Notes (Signed)
[facility] OFFICE PROGRESS NOTE  Patient Care Team: Rusty Aus, MD as PCP - General (Internal Medicine)   SUMMARY OF ONCOLOGIC HISTORY:  # MARCH 2017- April 2017- IRON DEFICIENCY ANEMIA ? Etiology s/p IV iron [EGD- ?June 2017- Bleeding gastric "polyp"/ colo- Dr.Gessner]  # hx of A.fib- on coumadin-HOLD April 2017/ CAD [Dr.[name]]; Hx of gastric ulcer [Dr.Gessner];   TIA/ CEA [s/p April 2017]  History of present illness:   72 -year-old Caucasian male patient is here for follow-up of his severe iron deficiency anemia- status post iron deficiency; also on Coumadin for A. fib is here for follow-up. Patient is currently taking iron pills once a day.  Patient has recently returned from [place].  Patient states that he had episode of abdominal pain 2 weeks ago for which she was evaluated in the emergency room with a CT scan that was negative for any acute process.  At that time his hemoglobin was 11.  However more recently patient noted to have worsening shortness of breath especially with exertion. He continues to deny any blood in stools.  No chest pain or cough.   REVIEW OF SYSTEMS:  A complete 10 point review of system is done which is negative except mentioned above/history of present illness.   PAST MEDICAL HISTORY :  Past Medical History  Diagnosis Date  . Hypertensive heart disease   . Hyperlipidemia   . Diabetes mellitus type II, controlled ([place])     a. Variable CBG 02/2012 - several meds adjusted.  . Mild aortic stenosis     a. 03/2011 Echo: EF 55-60%, Mild AS. b. Mild by cath 02/2012; c. 01/2014 Echo: EF 65-70%, Gr 1 DD, mild AS, mildly dil LA.  . Obesity   . GERD (gastroesophageal reflux disease)   . PAF (paroxysmal atrial fibrillation) ([place])     a. Newly dx 02/2012 & initiated on Coumadin (spont converted to NSR).  Marland Kitchen CAD (coronary artery disease)     a. 01/2010 CABG x 4: LIMA->LAD, VG->D1, VG->OM1, VG->PDA. b. NSTEMI 02/2012 in setting of AF-RVR with cath  s/p BMS to RCA (plan for 1 month, possibly up to 3 months of Plavix)  . Carotid disease, bilateral ([place])     a. 03/2011 U/S: 40-59% bilat Carotid dzs;  b. 04/2015 Carotid U/S: 40-59% bilat ICA stenosis; c. 05/2015 CTA Neck: 94% RICA, 17% LICA, mod-marked R vertebral stenosis, mod L vertebral stenosis.  . Myocardial infarction ([place]) 2014  . Hypertension   . Shortness of breath dyspnea   . Sleep apnea     CPAP with oxygen  . Chronic kidney disease   . Basal cell carcinoma of skin   . Anemia   . Macular edema bil    PAST SURGICAL HISTORY :   Past Surgical History  Procedure Laterality Date  . Cyst l kidney  [place]  . Angioplasty    . Cardiac catheterization    . Left heart catheterization with coronary angiogram N/A 03/09/2012    Procedure: LEFT HEART CATHETERIZATION WITH CORONARY ANGIOGRAM;  Surgeon: Wellington Hampshire, MD;  Location: [facility] [facility] CATH LAB;  Service: Cardiovascular;  Laterality: N/A;  . Percutaneous coronary stent intervention (pci-s)  03/09/2012    Procedure: PERCUTANEOUS CORONARY STENT INTERVENTION (PCI-S);  Surgeon: Wellington Hampshire, MD;  Location: [facility] CATH LAB;  Service: Cardiovascular;;  . Coronary artery bypass graft  2011    [facility], [place], [place],   . Coronary angioplasty      Dr.  [name]  . Cataract extraction Bilateral     Cataract Extraction with IOL  . Inguinal hernia repair Right     Right Inguinal Hernia Repair    FAMILY HISTORY :   Family History  Problem Relation Age of Onset  . COPD Mother     alive 76  . Heart attack Father     09-Sep-2013 deceased  . Bladder Cancer Mother   . Lung cancer Maternal Aunt   . Liver cancer Maternal Aunt   . Kidney disease Paternal Uncle   . Cancer Other     all paternal aunts and uncles    SOCIAL HISTORY:   Social History  Substance Use Topics  . Smoking status: Never Smoker   . Smokeless tobacco: Never Used     Comment: tobacco use - no  . Alcohol Use: Yes     Comment: rare drink    ALLERGIES:  has No  Known Allergies.  MEDICATIONS:  Current Outpatient Prescriptions  Medication Sig Dispense Refill  . acetaminophen (TYLENOL) 325 MG tablet Take 650 mg by mouth every 6 (six) hours as needed. For pain    . amiodarone (PACERONE) 200 MG tablet Take 0.5 tablets (100 mg total) by mouth daily. (Patient taking differently: Take 100 mg by mouth at bedtime. ) 45 tablet 3  . amLODipine (NORVASC) 5 MG tablet Take one tablet by mouth one time daily 90 tablet 3  . aspirin 81 MG EC tablet Take 81 mg by mouth daily.      Marland Kitchen atorvastatin (LIPITOR) 20 MG tablet Take 1 tablet (20 mg total) by mouth at bedtime. 90 tablet 3  . furosemide (LASIX) 20 MG tablet Take 1 tablet (20 mg total) by mouth as needed. 30 tablet 6  . glimepiride (AMARYL) 4 MG tablet Take 4 mg by mouth 2 (two) times daily.      . insulin glargine (LANTUS) 100 UNIT/ML injection Inject 20 Units into the skin daily.     . insulin lispro (HUMALOG) 100 UNIT/ML injection Inject 8 Units into the skin 2 (two) times daily with a meal. (Patient taking differently: Inject into the skin 3 (three) times daily. Per sliding scale.)    . metFORMIN (GLUCOPHAGE) 500 MG tablet Take 500 mg by mouth 2 (two) times daily with a meal.    . nitroGLYCERIN (NITROSTAT) 0.4 MG SL tablet Place 1 tablet (0.4 mg total) under the tongue every 5 (five) minutes as needed (up to 3 doses). 25 tablet 1  . OXYGEN Inhale 2.5 L/min into the lungs at bedtime. With CPAP    . pantoprazole (PROTONIX) 40 MG tablet Take 1 tablet (40 mg total) by mouth daily. (Patient taking differently: Take 40 mg by mouth 2 (two) times daily. ) 90 tablet 3  . ramipril (ALTACE) 10 MG capsule Take 1 capsule (10 mg total) by mouth daily. 90 capsule 5  . warfarin (COUMADIN) 5 MG tablet Take as directed by anticoagulation clinic 90 tablet 1  . IRON-VITAMIN C PO Take 1 tablet by mouth daily. Reported on 08/27/2015    . Omega-3 Fatty Acids (FISH OIL) 1200 MG CAPS Take 1 capsule by mouth daily. Reported on 08/27/2015      No current facility-administered medications for this visit.    PHYSICAL EXAMINATION:   BP 158/71 mmHg  Pulse 51  Temp(Src) 96.1 F (35.6 C)  Resp 18  Wt 192 lb 0.3 oz (87.1 kg)  Filed Weights   08/27/15 1015  Weight: 192 lb 0.3 oz (87.1 kg)  GENERAL: Well-nourished well-developed; Alert, no distress and comfortable. Alone.  EYES: positive for pallor OROPHARYNX: no thrush or ulceration; good dentition  NECK: supple, no masses felt LYMPH:  no palpable lymphadenopathy in the cervical, axillary or inguinal regions LUNGS: clear to auscultation and  No wheeze or crackles HEART/CVS: regular rate & rhythm and no murmurs; No lower extremity edema ABDOMEN:abdomen soft, non-tender and normal bowel sounds Musculoskeletal:no cyanosis of digits and no clubbing  PSYCH: alert & oriented x 3 with fluent speech NEURO: no focal motor/sensory deficits SKIN:  no rashes or significant lesions  LABORATORY DATA:  I have reviewed the data as listed    Component Value Date/Time   NA 139 07/25/2015 1138   NA 139 01/22/2013 0348   K 4.1 07/25/2015 1138   K 4.2 01/22/2013 0348   CL 103 07/25/2015 1138   CL 106 01/22/2013 0348   CO2 27 07/25/2015 1138   CO2 28 01/22/2013 0348   GLUCOSE 216* 07/25/2015 1138   GLUCOSE 158* 01/22/2013 0348   BUN 14 07/25/2015 1138   BUN 16 01/22/2013 0348   CREATININE 1.03 07/25/2015 1138   CREATININE 1.02 01/22/2013 0348   CALCIUM 9.1 07/25/2015 1138   CALCIUM 8.6 01/22/2013 0348   PROT 7.2 06/25/2015 1045   PROT 7.5 01/21/2013 0209   ALBUMIN 4.1 06/25/2015 1045   ALBUMIN 4.3 01/21/2013 0209   AST 30 06/25/2015 1045   AST 28 01/21/2013 0209   ALT 33 06/25/2015 1045   ALT 36 01/21/2013 0209   ALKPHOS 104 06/25/2015 1045   ALKPHOS 143* 01/21/2013 0209   BILITOT 0.4 06/25/2015 1045   BILITOT 0.4 01/21/2013 0209   GFRNONAA >60 07/25/2015 1138   GFRNONAA >60 01/22/2013 0348   GFRAA >60 07/25/2015 1138   GFRAA >60 01/22/2013 0348    No results  found for: SPEP, UPEP  Lab Results  Component Value Date   WBC 4.5 08/22/2015   NEUTROABS 2.5 08/22/2015   HGB 8.0 Repeated and verified X2.* 08/22/2015   HCT 26.2 Repeated and verified X2.* 08/22/2015   MCV 67.4 Repeated and verified X2.* 08/22/2015   PLT 264.0 08/22/2015      Chemistry      Component Value Date/Time   NA 139 07/25/2015 1138   NA 139 01/22/2013 0348   K 4.1 07/25/2015 1138   K 4.2 01/22/2013 0348   CL 103 07/25/2015 1138   CL 106 01/22/2013 0348   CO2 27 07/25/2015 1138   CO2 28 01/22/2013 0348   BUN 14 07/25/2015 1138   BUN 16 01/22/2013 0348   CREATININE 1.03 07/25/2015 1138   CREATININE 1.02 01/22/2013 0348      Component Value Date/Time   CALCIUM 9.1 07/25/2015 1138   CALCIUM 8.6 01/22/2013 0348   ALKPHOS 104 06/25/2015 1045   ALKPHOS 143* 01/21/2013 0209   AST 30 06/25/2015 1045   AST 28 01/21/2013 0209   ALT 33 06/25/2015 1045   ALT 36 01/21/2013 0209   BILITOT 0.4 06/25/2015 1045   BILITOT 0.4 01/21/2013 0209        ASSESSMENT & PLAN:   Iron deficiency anemia due to chronic blood loss # Severe anemia- iron deficient- likely from stomach blood loss. Status post IV iron Feraheme in Nov 2017; hemoglobin today is 11.9; ferritin-10.  Patient is on p.o. Iron; however this is not clearly helping the patient.  I would recommend IV Feraheme today.  # History of A. Fib- Coumadin as per cards.   #Hypertension: Elevated today; systolic 782N.  Recommend follow-up with PCP/cardiology.  # follow up in 6 months/cbc/iron studies few days prior/ IV ferrameh.

## 2017-08-04 NOTE — Assessment & Plan Note (Addendum)
#   Severe anemia- iron deficient- likely from stomach blood loss. Status post IV iron Feraheme in Nov 2017; hemoglobin today is 11.9; ferritin-10.  Patient is on p.o. Iron; however this is not clearly helping the patient.  I would recommend IV Feraheme today.  # History of A. Fib- Coumadin as per cards.   #Hypertension: Elevated today; systolic 329J.  Recommend follow-up with PCP/cardiology.  # follow up in 6 months/cbc/iron studies few days prior/ IV ferrameh.

## 2017-08-17 DIAGNOSIS — E113513 Type 2 diabetes mellitus with proliferative diabetic retinopathy with macular edema, bilateral: Secondary | ICD-10-CM | POA: Diagnosis not present

## 2017-08-17 DIAGNOSIS — D3132 Benign neoplasm of left choroid: Secondary | ICD-10-CM | POA: Diagnosis not present

## 2017-08-20 DIAGNOSIS — R011 Cardiac murmur, unspecified: Secondary | ICD-10-CM | POA: Diagnosis not present

## 2017-08-20 DIAGNOSIS — I2510 Atherosclerotic heart disease of native coronary artery without angina pectoris: Secondary | ICD-10-CM | POA: Diagnosis not present

## 2017-08-20 DIAGNOSIS — R0689 Other abnormalities of breathing: Secondary | ICD-10-CM | POA: Diagnosis not present

## 2017-08-20 DIAGNOSIS — G4733 Obstructive sleep apnea (adult) (pediatric): Secondary | ICD-10-CM | POA: Diagnosis not present

## 2017-09-17 DIAGNOSIS — R0689 Other abnormalities of breathing: Secondary | ICD-10-CM | POA: Diagnosis not present

## 2017-09-17 DIAGNOSIS — I2510 Atherosclerotic heart disease of native coronary artery without angina pectoris: Secondary | ICD-10-CM | POA: Diagnosis not present

## 2017-09-17 DIAGNOSIS — R011 Cardiac murmur, unspecified: Secondary | ICD-10-CM | POA: Diagnosis not present

## 2017-09-17 DIAGNOSIS — G4733 Obstructive sleep apnea (adult) (pediatric): Secondary | ICD-10-CM | POA: Diagnosis not present

## 2017-09-20 ENCOUNTER — Ambulatory Visit (INDEPENDENT_AMBULATORY_CARE_PROVIDER_SITE_OTHER): Payer: PPO

## 2017-09-20 DIAGNOSIS — I4819 Other persistent atrial fibrillation: Secondary | ICD-10-CM

## 2017-09-20 DIAGNOSIS — I481 Persistent atrial fibrillation: Secondary | ICD-10-CM

## 2017-09-20 DIAGNOSIS — Z7901 Long term (current) use of anticoagulants: Secondary | ICD-10-CM | POA: Diagnosis not present

## 2017-09-20 DIAGNOSIS — I4891 Unspecified atrial fibrillation: Secondary | ICD-10-CM

## 2017-09-20 LAB — POCT INR: INR: 2.5

## 2017-09-20 NOTE — Patient Instructions (Signed)
Please continue taking 1 tablet daily except 1/2 tablet on Tuesdays and Saturdays.   If you can't have a salad today, only take a 1/2 dose tonight.  Recheck INR in 6 weeks.

## 2017-09-27 DIAGNOSIS — I788 Other diseases of capillaries: Secondary | ICD-10-CM | POA: Diagnosis not present

## 2017-09-27 DIAGNOSIS — L821 Other seborrheic keratosis: Secondary | ICD-10-CM | POA: Diagnosis not present

## 2017-09-27 DIAGNOSIS — Z1283 Encounter for screening for malignant neoplasm of skin: Secondary | ICD-10-CM | POA: Diagnosis not present

## 2017-09-27 DIAGNOSIS — D1800 Hemangioma unspecified site: Secondary | ICD-10-CM | POA: Diagnosis not present

## 2017-09-27 DIAGNOSIS — L918 Other hypertrophic disorders of the skin: Secondary | ICD-10-CM | POA: Diagnosis not present

## 2017-09-27 DIAGNOSIS — L570 Actinic keratosis: Secondary | ICD-10-CM | POA: Diagnosis not present

## 2017-09-27 DIAGNOSIS — D485 Neoplasm of uncertain behavior of skin: Secondary | ICD-10-CM | POA: Diagnosis not present

## 2017-09-27 DIAGNOSIS — D229 Melanocytic nevi, unspecified: Secondary | ICD-10-CM | POA: Diagnosis not present

## 2017-09-27 DIAGNOSIS — L578 Other skin changes due to chronic exposure to nonionizing radiation: Secondary | ICD-10-CM | POA: Diagnosis not present

## 2017-09-27 DIAGNOSIS — C4441 Basal cell carcinoma of skin of scalp and neck: Secondary | ICD-10-CM | POA: Diagnosis not present

## 2017-09-27 DIAGNOSIS — Z85828 Personal history of other malignant neoplasm of skin: Secondary | ICD-10-CM | POA: Diagnosis not present

## 2017-11-01 ENCOUNTER — Ambulatory Visit (INDEPENDENT_AMBULATORY_CARE_PROVIDER_SITE_OTHER): Payer: PPO

## 2017-11-01 DIAGNOSIS — I4891 Unspecified atrial fibrillation: Secondary | ICD-10-CM

## 2017-11-01 DIAGNOSIS — Z7901 Long term (current) use of anticoagulants: Secondary | ICD-10-CM

## 2017-11-01 DIAGNOSIS — I481 Persistent atrial fibrillation: Secondary | ICD-10-CM | POA: Diagnosis not present

## 2017-11-01 DIAGNOSIS — I4819 Other persistent atrial fibrillation: Secondary | ICD-10-CM

## 2017-11-01 LAB — POCT INR: INR: 3.2 — AB (ref 2.0–3.0)

## 2017-11-01 NOTE — Patient Instructions (Signed)
Please take 1/2 tablet tonight, then continue taking 1 tablet daily except 1/2 tablet on Tuesdays and Saturdays.   Recheck INR in 6 weeks.

## 2017-11-08 DIAGNOSIS — C4441 Basal cell carcinoma of skin of scalp and neck: Secondary | ICD-10-CM | POA: Diagnosis not present

## 2017-11-16 ENCOUNTER — Other Ambulatory Visit: Payer: Self-pay | Admitting: Cardiovascular Disease

## 2017-12-03 ENCOUNTER — Telehealth: Payer: Self-pay | Admitting: Cardiovascular Disease

## 2017-12-03 NOTE — Telephone Encounter (Signed)
Returned call to the patient & he confirmed he mixed up the doses and took Saturday's dose yesterday. Therefore, advised pt to take 1 pill on Saturday instead of 1/2 pill since the doses were mixed up and he has had today's dose. Patient was thankful for the assistance.

## 2017-12-03 NOTE — Telephone Encounter (Signed)
Pt states he supposed to take his Warfarin 1/2 pill on Tuesday and 1/2 pill on Saturday. Yesterday he had his days mixed up and took his Saturday pill. Please call and discuss.

## 2017-12-13 ENCOUNTER — Ambulatory Visit (INDEPENDENT_AMBULATORY_CARE_PROVIDER_SITE_OTHER): Payer: PPO

## 2017-12-13 DIAGNOSIS — I4891 Unspecified atrial fibrillation: Secondary | ICD-10-CM | POA: Diagnosis not present

## 2017-12-13 DIAGNOSIS — Z7901 Long term (current) use of anticoagulants: Secondary | ICD-10-CM

## 2017-12-13 DIAGNOSIS — I481 Persistent atrial fibrillation: Secondary | ICD-10-CM | POA: Diagnosis not present

## 2017-12-13 DIAGNOSIS — I4819 Other persistent atrial fibrillation: Secondary | ICD-10-CM

## 2017-12-13 LAB — POCT INR: INR: 1.8 — AB (ref 2.0–3.0)

## 2017-12-13 NOTE — Patient Instructions (Signed)
Please take extra 1/2 tablet tonight & tomorrow, then continue taking 1 tablet daily except 1/2 tablet on Tuesdays and Saturdays.   Recheck INR in 4 weeks.

## 2017-12-28 DIAGNOSIS — G4733 Obstructive sleep apnea (adult) (pediatric): Secondary | ICD-10-CM | POA: Diagnosis not present

## 2017-12-28 DIAGNOSIS — I2510 Atherosclerotic heart disease of native coronary artery without angina pectoris: Secondary | ICD-10-CM | POA: Diagnosis not present

## 2017-12-28 DIAGNOSIS — R011 Cardiac murmur, unspecified: Secondary | ICD-10-CM | POA: Diagnosis not present

## 2017-12-28 DIAGNOSIS — R0689 Other abnormalities of breathing: Secondary | ICD-10-CM | POA: Diagnosis not present

## 2018-01-03 DIAGNOSIS — Z1283 Encounter for screening for malignant neoplasm of skin: Secondary | ICD-10-CM | POA: Diagnosis not present

## 2018-01-03 DIAGNOSIS — L570 Actinic keratosis: Secondary | ICD-10-CM | POA: Diagnosis not present

## 2018-01-03 DIAGNOSIS — Z85828 Personal history of other malignant neoplasm of skin: Secondary | ICD-10-CM | POA: Diagnosis not present

## 2018-01-03 DIAGNOSIS — L821 Other seborrheic keratosis: Secondary | ICD-10-CM | POA: Diagnosis not present

## 2018-01-03 DIAGNOSIS — L578 Other skin changes due to chronic exposure to nonionizing radiation: Secondary | ICD-10-CM | POA: Diagnosis not present

## 2018-01-03 DIAGNOSIS — D1800 Hemangioma unspecified site: Secondary | ICD-10-CM | POA: Diagnosis not present

## 2018-01-03 DIAGNOSIS — L918 Other hypertrophic disorders of the skin: Secondary | ICD-10-CM | POA: Diagnosis not present

## 2018-01-06 ENCOUNTER — Telehealth: Payer: Self-pay | Admitting: *Deleted

## 2018-01-06 DIAGNOSIS — D509 Iron deficiency anemia, unspecified: Secondary | ICD-10-CM

## 2018-01-06 NOTE — Telephone Encounter (Signed)
Call attempt made - left vm for pt to return our clinic's call. Will also send mychart msg to patient as well.  md would like pt to come in for lab only - cbc, iron studies- ferritin

## 2018-01-06 NOTE — Telephone Encounter (Signed)
-----   Message from Wallene Dales sent at 12/31/2017  3:56 PM EDT ----- Regarding: Pt not feeling Well Contact: [phone] Pt wife is concerned about pt and would like a call about him being so tired all the time. Please call her at the number listed above.  Thank you

## 2018-01-07 ENCOUNTER — Other Ambulatory Visit: Payer: Self-pay | Admitting: Cardiovascular Disease

## 2018-01-10 ENCOUNTER — Inpatient Hospital Stay: Payer: PPO | Attending: Internal Medicine

## 2018-01-10 ENCOUNTER — Telehealth: Payer: Self-pay | Admitting: Internal Medicine

## 2018-01-10 ENCOUNTER — Ambulatory Visit (INDEPENDENT_AMBULATORY_CARE_PROVIDER_SITE_OTHER): Payer: PPO

## 2018-01-10 DIAGNOSIS — Z79899 Other long term (current) drug therapy: Secondary | ICD-10-CM | POA: Diagnosis not present

## 2018-01-10 DIAGNOSIS — D509 Iron deficiency anemia, unspecified: Secondary | ICD-10-CM

## 2018-01-10 DIAGNOSIS — D500 Iron deficiency anemia secondary to blood loss (chronic): Secondary | ICD-10-CM | POA: Diagnosis not present

## 2018-01-10 DIAGNOSIS — I481 Persistent atrial fibrillation: Secondary | ICD-10-CM | POA: Diagnosis not present

## 2018-01-10 DIAGNOSIS — I4819 Other persistent atrial fibrillation: Secondary | ICD-10-CM

## 2018-01-10 DIAGNOSIS — I4891 Unspecified atrial fibrillation: Secondary | ICD-10-CM

## 2018-01-10 DIAGNOSIS — Z7901 Long term (current) use of anticoagulants: Secondary | ICD-10-CM | POA: Diagnosis not present

## 2018-01-10 LAB — CBC WITH DIFFERENTIAL/PLATELET
Basophils Absolute: 0 10*3/uL (ref 0–0.1)
Basophils Relative: 1 %
Eosinophils Absolute: 0.1 10*3/uL (ref 0–0.7)
Eosinophils Relative: 3 %
HCT: 36.4 % — ABNORMAL LOW (ref 40.0–52.0)
Hemoglobin: 12.1 g/dL — ABNORMAL LOW (ref 13.0–18.0)
Lymphocytes Relative: 27 %
Lymphs Abs: 1 10*3/uL (ref 1.0–3.6)
MCH: 30.7 pg (ref 26.0–34.0)
MCHC: 33.1 g/dL (ref 32.0–36.0)
MCV: 92.8 fL (ref 80.0–100.0)
Monocytes Absolute: 0.5 10*3/uL (ref 0.2–1.0)
Monocytes Relative: 13 %
Neutro Abs: 2.1 10*3/uL (ref 1.4–6.5)
Neutrophils Relative %: 56 %
Platelets: 206 10*3/uL (ref 150–440)
RBC: 3.93 MIL/uL — ABNORMAL LOW (ref 4.40–5.90)
RDW: 14.2 % (ref 11.5–14.5)
WBC: 3.6 10*3/uL — ABNORMAL LOW (ref 3.8–10.6)

## 2018-01-10 LAB — IRON AND TIBC
Iron: 110 ug/dL (ref 45–182)
Saturation Ratios: 27 % (ref 17.9–39.5)
TIBC: 409 ug/dL (ref 250–450)
UIBC: 299 ug/dL

## 2018-01-10 LAB — FERRITIN: Ferritin: 13 ng/mL — ABNORMAL LOW (ref 24–336)

## 2018-01-10 LAB — POCT INR: INR: 2.8 (ref 2.0–3.0)

## 2018-01-10 NOTE — Patient Instructions (Signed)
Please continue taking 1 tablet daily except 1/2 tablet on Tuesdays and Saturdays.   Recheck INR in 5 weeks.

## 2018-01-10 NOTE — Telephone Encounter (Signed)
Please inform patient that iron levels are low recommend IV iron/Feraheme  #Please schedule Feraheme  this week;  #Please also schedule Feraheme/CBC-labs same day; September 13/appointment with me. thx

## 2018-01-11 NOTE — Telephone Encounter (Signed)
Seth Hardy, I contacted patient. He is aware that IV iron is needed. He would like to come on Friday if possible. Please contact him with an apt

## 2018-01-14 ENCOUNTER — Inpatient Hospital Stay: Payer: PPO

## 2018-01-20 ENCOUNTER — Inpatient Hospital Stay: Payer: PPO

## 2018-01-20 VITALS — BP 174/65 | HR 47 | Temp 97.3°F | Resp 18

## 2018-01-20 DIAGNOSIS — D500 Iron deficiency anemia secondary to blood loss (chronic): Secondary | ICD-10-CM | POA: Diagnosis not present

## 2018-01-20 MED ORDER — SODIUM CHLORIDE 0.9 % IV SOLN
510.0000 mg | Freq: Once | INTRAVENOUS | Status: AC
  Administered 2018-01-20: 510 mg via INTRAVENOUS
  Filled 2018-01-20: qty 17

## 2018-01-20 MED ORDER — SODIUM CHLORIDE 0.9 % IV SOLN
Freq: Once | INTRAVENOUS | Status: AC
  Administered 2018-01-20: 14:00:00 via INTRAVENOUS
  Filled 2018-01-20: qty 250

## 2018-01-29 ENCOUNTER — Other Ambulatory Visit: Payer: Self-pay | Admitting: Cardiovascular Disease

## 2018-02-02 ENCOUNTER — Other Ambulatory Visit: Payer: Self-pay

## 2018-02-02 ENCOUNTER — Inpatient Hospital Stay: Payer: PPO | Attending: Internal Medicine

## 2018-02-02 DIAGNOSIS — Z8711 Personal history of peptic ulcer disease: Secondary | ICD-10-CM | POA: Diagnosis not present

## 2018-02-02 DIAGNOSIS — E669 Obesity, unspecified: Secondary | ICD-10-CM | POA: Diagnosis not present

## 2018-02-02 DIAGNOSIS — Z7982 Long term (current) use of aspirin: Secondary | ICD-10-CM | POA: Diagnosis not present

## 2018-02-02 DIAGNOSIS — G4730 Sleep apnea, unspecified: Secondary | ICD-10-CM | POA: Insufficient documentation

## 2018-02-02 DIAGNOSIS — I2510 Atherosclerotic heart disease of native coronary artery without angina pectoris: Secondary | ICD-10-CM | POA: Diagnosis not present

## 2018-02-02 DIAGNOSIS — I4891 Unspecified atrial fibrillation: Secondary | ICD-10-CM | POA: Insufficient documentation

## 2018-02-02 DIAGNOSIS — Z85828 Personal history of other malignant neoplasm of skin: Secondary | ICD-10-CM | POA: Insufficient documentation

## 2018-02-02 DIAGNOSIS — G4733 Obstructive sleep apnea (adult) (pediatric): Secondary | ICD-10-CM | POA: Insufficient documentation

## 2018-02-02 DIAGNOSIS — I1310 Hypertensive heart and chronic kidney disease without heart failure, with stage 1 through stage 4 chronic kidney disease, or unspecified chronic kidney disease: Secondary | ICD-10-CM | POA: Insufficient documentation

## 2018-02-02 DIAGNOSIS — Z801 Family history of malignant neoplasm of trachea, bronchus and lung: Secondary | ICD-10-CM | POA: Diagnosis not present

## 2018-02-02 DIAGNOSIS — Z8673 Personal history of transient ischemic attack (TIA), and cerebral infarction without residual deficits: Secondary | ICD-10-CM | POA: Diagnosis not present

## 2018-02-02 DIAGNOSIS — E119 Type 2 diabetes mellitus without complications: Secondary | ICD-10-CM | POA: Diagnosis not present

## 2018-02-02 DIAGNOSIS — Z79899 Other long term (current) drug therapy: Secondary | ICD-10-CM | POA: Insufficient documentation

## 2018-02-02 DIAGNOSIS — E785 Hyperlipidemia, unspecified: Secondary | ICD-10-CM | POA: Insufficient documentation

## 2018-02-02 DIAGNOSIS — D500 Iron deficiency anemia secondary to blood loss (chronic): Secondary | ICD-10-CM | POA: Insufficient documentation

## 2018-02-02 DIAGNOSIS — I252 Old myocardial infarction: Secondary | ICD-10-CM | POA: Insufficient documentation

## 2018-02-02 DIAGNOSIS — K219 Gastro-esophageal reflux disease without esophagitis: Secondary | ICD-10-CM | POA: Insufficient documentation

## 2018-02-02 DIAGNOSIS — Z800 Family history of malignant neoplasm of digestive organs: Secondary | ICD-10-CM | POA: Diagnosis not present

## 2018-02-02 DIAGNOSIS — Z794 Long term (current) use of insulin: Secondary | ICD-10-CM | POA: Insufficient documentation

## 2018-02-02 DIAGNOSIS — I350 Nonrheumatic aortic (valve) stenosis: Secondary | ICD-10-CM | POA: Insufficient documentation

## 2018-02-02 LAB — IRON AND TIBC
Iron: 91 ug/dL (ref 45–182)
Saturation Ratios: 28 % (ref 17.9–39.5)
TIBC: 320 ug/dL (ref 250–450)
UIBC: 229 ug/dL

## 2018-02-02 LAB — COMPREHENSIVE METABOLIC PANEL
ALT: 29 U/L (ref 0–44)
AST: 27 U/L (ref 15–41)
Albumin: 4.2 g/dL (ref 3.5–5.0)
Alkaline Phosphatase: 85 U/L (ref 38–126)
Anion gap: 9 (ref 5–15)
BUN: 18 mg/dL (ref 8–23)
CO2: 27 mmol/L (ref 22–32)
Calcium: 9.3 mg/dL (ref 8.9–10.3)
Chloride: 104 mmol/L (ref 98–111)
Creatinine, Ser: 1.05 mg/dL (ref 0.61–1.24)
GFR calc Af Amer: >60 mL/min (ref >60)
GFR calc non Af Amer: >60 mL/min (ref >60)
Glucose, Bld: 116 mg/dL — ABNORMAL HIGH (ref 70–99)
Potassium: 5.1 mmol/L (ref 3.5–5.1)
Sodium: 140 mmol/L (ref 135–145)
Total Bilirubin: 0.7 mg/dL (ref 0.3–1.2)
Total Protein: 7 g/dL (ref 6.5–8.1)

## 2018-02-02 LAB — FERRITIN: Ferritin: 149 ng/mL (ref 24–336)

## 2018-02-02 LAB — CBC WITH DIFFERENTIAL/PLATELET
Basophils Absolute: 0 10*3/uL (ref 0–0.1)
Basophils Relative: 1 %
Eosinophils Absolute: 0.1 10*3/uL (ref 0–0.7)
Eosinophils Relative: 4 %
HCT: 36 % — ABNORMAL LOW (ref 40.0–52.0)
Hemoglobin: 12 g/dL — ABNORMAL LOW (ref 13.0–18.0)
Lymphocytes Relative: 35 %
Lymphs Abs: 0.9 10*3/uL — ABNORMAL LOW (ref 1.0–3.6)
MCH: 31.2 pg (ref 26.0–34.0)
MCHC: 33.4 g/dL (ref 32.0–36.0)
MCV: 93.5 fL (ref 80.0–100.0)
Monocytes Absolute: 0.4 10*3/uL (ref 0.2–1.0)
Monocytes Relative: 14 %
Neutro Abs: 1.3 10*3/uL — ABNORMAL LOW (ref 1.4–6.5)
Neutrophils Relative %: 46 %
Platelets: 172 10*3/uL (ref 150–440)
RBC: 3.85 MIL/uL — ABNORMAL LOW (ref 4.40–5.90)
RDW: 15.3 % — ABNORMAL HIGH (ref 11.5–14.5)
WBC: 2.7 10*3/uL — ABNORMAL LOW (ref 3.8–10.6)

## 2018-02-04 ENCOUNTER — Inpatient Hospital Stay: Payer: PPO

## 2018-02-04 ENCOUNTER — Other Ambulatory Visit: Payer: Self-pay

## 2018-02-04 ENCOUNTER — Inpatient Hospital Stay (HOSPITAL_BASED_OUTPATIENT_CLINIC_OR_DEPARTMENT_OTHER): Payer: PPO | Admitting: Internal Medicine

## 2018-02-04 VITALS — BP 172/77 | HR 47 | Temp 96.3°F | Resp 18 | Wt 190.0 lb

## 2018-02-04 DIAGNOSIS — D500 Iron deficiency anemia secondary to blood loss (chronic): Secondary | ICD-10-CM | POA: Diagnosis not present

## 2018-02-04 DIAGNOSIS — K219 Gastro-esophageal reflux disease without esophagitis: Secondary | ICD-10-CM | POA: Diagnosis not present

## 2018-02-04 DIAGNOSIS — Z79899 Other long term (current) drug therapy: Secondary | ICD-10-CM

## 2018-02-04 DIAGNOSIS — E119 Type 2 diabetes mellitus without complications: Secondary | ICD-10-CM | POA: Diagnosis not present

## 2018-02-04 DIAGNOSIS — Z8711 Personal history of peptic ulcer disease: Secondary | ICD-10-CM

## 2018-02-04 DIAGNOSIS — I252 Old myocardial infarction: Secondary | ICD-10-CM | POA: Diagnosis not present

## 2018-02-04 DIAGNOSIS — I1310 Hypertensive heart and chronic kidney disease without heart failure, with stage 1 through stage 4 chronic kidney disease, or unspecified chronic kidney disease: Secondary | ICD-10-CM | POA: Diagnosis not present

## 2018-02-04 DIAGNOSIS — Z85828 Personal history of other malignant neoplasm of skin: Secondary | ICD-10-CM

## 2018-02-04 DIAGNOSIS — I4891 Unspecified atrial fibrillation: Secondary | ICD-10-CM

## 2018-02-04 DIAGNOSIS — E785 Hyperlipidemia, unspecified: Secondary | ICD-10-CM

## 2018-02-04 DIAGNOSIS — I2510 Atherosclerotic heart disease of native coronary artery without angina pectoris: Secondary | ICD-10-CM

## 2018-02-04 DIAGNOSIS — G4733 Obstructive sleep apnea (adult) (pediatric): Secondary | ICD-10-CM | POA: Diagnosis not present

## 2018-02-04 DIAGNOSIS — E669 Obesity, unspecified: Secondary | ICD-10-CM

## 2018-02-04 DIAGNOSIS — I350 Nonrheumatic aortic (valve) stenosis: Secondary | ICD-10-CM

## 2018-02-04 DIAGNOSIS — Z801 Family history of malignant neoplasm of trachea, bronchus and lung: Secondary | ICD-10-CM

## 2018-02-04 DIAGNOSIS — G4730 Sleep apnea, unspecified: Secondary | ICD-10-CM | POA: Diagnosis not present

## 2018-02-04 DIAGNOSIS — Z7982 Long term (current) use of aspirin: Secondary | ICD-10-CM

## 2018-02-04 DIAGNOSIS — Z800 Family history of malignant neoplasm of digestive organs: Secondary | ICD-10-CM

## 2018-02-04 DIAGNOSIS — Z794 Long term (current) use of insulin: Secondary | ICD-10-CM

## 2018-02-04 DIAGNOSIS — Z8673 Personal history of transient ischemic attack (TIA), and cerebral infarction without residual deficits: Secondary | ICD-10-CM

## 2018-02-04 NOTE — Progress Notes (Signed)
[facility] OFFICE PROGRESS NOTE  Patient Care Team: Rusty Aus, MD as PCP - General (Internal Medicine)   SUMMARY OF ONCOLOGIC HISTORY:  # MARCH 2017- April 2017- IRON DEFICIENCY ANEMIA ? Etiology s/p IV iron [EGD- ?June 2017- Bleeding gastric "polyp"/ colo- Dr.Gessner]  # hx of A.fib- on coumadin-HOLD April 2017/ CAD [Dr.[name]]; Hx of gastric ulcer [Dr.Gessner];   TIA/ CEA [s/p April 2017]; OSA on CPAP.   History of present illness:   72 -year-old Caucasian male patient is here for follow-up of his severe iron deficiency anemia- status post iron deficiency; also on Coumadin for A. fib is here for follow-up. Patient is currently taking iron pills once a day.  As per the wife patient is tired.  However patient states that he had one episode of fatigue which is currently resolved.  Patient received IV iron infusion when his ferritin was 13 hemoglobin at the time was 12.  Patient currently feels improved.  Energy is also better.  Denies any cough or shortness of breath or chest pain.  Review of Systems  Constitutional: Negative for chills, diaphoresis, fever, malaise/fatigue and weight loss.  HENT: Negative for nosebleeds and sore throat.   Eyes: Negative for double vision.  Respiratory: Negative for cough, hemoptysis, sputum production, shortness of breath and wheezing.   Cardiovascular: Negative for chest pain, palpitations, orthopnea and leg swelling.  Gastrointestinal: Negative for abdominal pain, blood in stool, constipation, diarrhea, heartburn, melena, nausea and vomiting.  Genitourinary: Negative for dysuria, frequency and urgency.  Musculoskeletal: Negative for back pain and joint pain.  Skin: Negative.  Negative for itching and rash.  Neurological: Negative for dizziness, tingling, focal weakness, weakness and headaches.  Endo/Heme/Allergies: Does not bruise/bleed easily.  Psychiatric/Behavioral: Negative for depression. The patient is not nervous/anxious and  does not have insomnia.      PAST MEDICAL HISTORY :  Past Medical History  Diagnosis Date  . Hypertensive heart disease   . Hyperlipidemia   . Diabetes mellitus type II, controlled ([place])     a. Variable CBG 02/2012 - several meds adjusted.  . Mild aortic stenosis     a. 03/2011 Echo: EF 55-60%, Mild AS. b. Mild by cath 02/2012; c. 01/2014 Echo: EF 65-70%, Gr 1 DD, mild AS, mildly dil LA.  . Obesity   . GERD (gastroesophageal reflux disease)   . PAF (paroxysmal atrial fibrillation) ([place])     a. Newly dx 02/2012 & initiated on Coumadin (spont converted to NSR).  Marland Kitchen CAD (coronary artery disease)     a. 01/2010 CABG x 4: LIMA->LAD, VG->D1, VG->OM1, VG->PDA. b. NSTEMI 02/2012 in setting of AF-RVR with cath s/p BMS to RCA (plan for 1 month, possibly up to 3 months of Plavix)  . Carotid disease, bilateral ([place])     a. 03/2011 U/S: 40-59% bilat Carotid dzs;  b. 04/2015 Carotid U/S: 40-59% bilat ICA stenosis; c. 05/2015 CTA Neck: 44% RICA, 01% LICA, mod-marked R vertebral stenosis, mod L vertebral stenosis.  . Myocardial infarction ([place]) 2014  . Hypertension   . Shortness of breath dyspnea   . Sleep apnea     CPAP with oxygen  . Chronic kidney disease   . Basal cell carcinoma of skin   . Anemia   . Macular edema bil    PAST SURGICAL HISTORY :   Past Surgical History  Procedure Laterality Date  . Cyst l kidney  [place]  . Angioplasty    . Cardiac catheterization    .  Left heart catheterization with coronary angiogram N/A 03/09/2012    Procedure: LEFT HEART CATHETERIZATION WITH CORONARY ANGIOGRAM;  Surgeon: Wellington Hampshire, MD;  Location: [facility] CATH LAB;  Service: Cardiovascular;  Laterality: N/A;  . Percutaneous coronary stent intervention (pci-s)  03/09/2012    Procedure: PERCUTANEOUS CORONARY STENT INTERVENTION (PCI-S);  Surgeon: Wellington Hampshire, MD;  Location: [facility] CATH LAB;  Service: Cardiovascular;;  . Coronary artery bypass graft  Aug 16, 2009    [facility], [place], [place],   .  Coronary angioplasty      Dr. Fletcher Anon  . Cataract extraction Bilateral     Cataract Extraction with IOL  . Inguinal hernia repair Right     Right Inguinal Hernia Repair    FAMILY HISTORY :   Family History  Problem Relation Age of Onset  . COPD Mother     alive 49  . Heart attack Father     08/16/13 deceased  . Bladder Cancer Mother   . Lung cancer Maternal Aunt   . Liver cancer Maternal Aunt   . Kidney disease Paternal Uncle   . Cancer Other     all paternal aunts and uncles    SOCIAL HISTORY:   Social History  Substance Use Topics  . Smoking status: Never Smoker   . Smokeless tobacco: Never Used     Comment: tobacco use - no  . Alcohol Use: Yes     Comment: rare drink    ALLERGIES:  has No Known Allergies.  MEDICATIONS:  Current Outpatient Prescriptions  Medication Sig Dispense Refill  . acetaminophen (TYLENOL) 325 MG tablet Take 650 mg by mouth every 6 (six) hours as needed. For pain    . amiodarone (PACERONE) 200 MG tablet Take 0.5 tablets (100 mg total) by mouth daily. (Patient taking differently: Take 100 mg by mouth at bedtime. ) 45 tablet 3  . amLODipine (NORVASC) 5 MG tablet Take one tablet by mouth one time daily 90 tablet 3  . aspirin 81 MG EC tablet Take 81 mg by mouth daily.      Marland Kitchen atorvastatin (LIPITOR) 20 MG tablet Take 1 tablet (20 mg total) by mouth at bedtime. 90 tablet 3  . furosemide (LASIX) 20 MG tablet Take 1 tablet (20 mg total) by mouth as needed. 30 tablet 6  . glimepiride (AMARYL) 4 MG tablet Take 4 mg by mouth 2 (two) times daily.      . insulin glargine (LANTUS) 100 UNIT/ML injection Inject 20 Units into the skin daily.     . insulin lispro (HUMALOG) 100 UNIT/ML injection Inject 8 Units into the skin 2 (two) times daily with a meal. (Patient taking differently: Inject into the skin 3 (three) times daily. Per sliding scale.)    . metFORMIN (GLUCOPHAGE) 500 MG tablet Take 500 mg by mouth 2 (two) times daily with a meal.    . nitroGLYCERIN  (NITROSTAT) 0.4 MG SL tablet Place 1 tablet (0.4 mg total) under the tongue every 5 (five) minutes as needed (up to 3 doses). 25 tablet 1  . OXYGEN Inhale 2.5 L/min into the lungs at bedtime. With CPAP    . pantoprazole (PROTONIX) 40 MG tablet Take 1 tablet (40 mg total) by mouth daily. (Patient taking differently: Take 40 mg by mouth 2 (two) times daily. ) 90 tablet 3  . ramipril (ALTACE) 10 MG capsule Take 1 capsule (10 mg total) by mouth daily. 90 capsule 5  . warfarin (COUMADIN) 5 MG tablet Take as directed by anticoagulation clinic 90 tablet  1  . IRON-VITAMIN C PO Take 1 tablet by mouth daily. Reported on 08/27/2015    . Omega-3 Fatty Acids (FISH OIL) 1200 MG CAPS Take 1 capsule by mouth daily. Reported on 08/27/2015     No current facility-administered medications for this visit.    PHYSICAL EXAMINATION:   BP 158/71 mmHg  Pulse 51  Temp(Src) 96.1 F (35.6 C)  Resp 18  Wt 192 lb 0.3 oz (87.1 kg)  Filed Weights   08/27/15 1015  Weight: 192 lb 0.3 oz (87.1 kg)    Physical Exam  Constitutional: He is oriented to person, place, and time and well-developed, well-nourished, and in no distress.  Accompanied by his wife.  HENT:  Head: Normocephalic and atraumatic.  Mouth/Throat: Oropharynx is clear and moist. No oropharyngeal exudate.  Eyes: Pupils are equal, round, and reactive to light.  Neck: Normal range of motion. Neck supple.  Cardiovascular: Normal rate and regular rhythm.  Pulmonary/Chest: No respiratory distress. He has no wheezes.  Abdominal: Soft. Bowel sounds are normal. He exhibits no distension and no mass. There is no tenderness. There is no rebound and no guarding.  Musculoskeletal: Normal range of motion. He exhibits no edema or tenderness.  Neurological: He is alert and oriented to person, place, and time.  Skin: Skin is warm.  Psychiatric: Affect normal.     LABORATORY DATA:  I have reviewed the data as listed    Component Value Date/Time   NA 139  07/25/2015 1138   NA 139 01/22/2013 0348   K 4.1 07/25/2015 1138   K 4.2 01/22/2013 0348   CL 103 07/25/2015 1138   CL 106 01/22/2013 0348   CO2 27 07/25/2015 1138   CO2 28 01/22/2013 0348   GLUCOSE 216* 07/25/2015 1138   GLUCOSE 158* 01/22/2013 0348   BUN 14 07/25/2015 1138   BUN 16 01/22/2013 0348   CREATININE 1.03 07/25/2015 1138   CREATININE 1.02 01/22/2013 0348   CALCIUM 9.1 07/25/2015 1138   CALCIUM 8.6 01/22/2013 0348   PROT 7.2 06/25/2015 1045   PROT 7.5 01/21/2013 0209   ALBUMIN 4.1 06/25/2015 1045   ALBUMIN 4.3 01/21/2013 0209   AST 30 06/25/2015 1045   AST 28 01/21/2013 0209   ALT 33 06/25/2015 1045   ALT 36 01/21/2013 0209   ALKPHOS 104 06/25/2015 1045   ALKPHOS 143* 01/21/2013 0209   BILITOT 0.4 06/25/2015 1045   BILITOT 0.4 01/21/2013 0209   GFRNONAA >60 07/25/2015 1138   GFRNONAA >60 01/22/2013 0348   GFRAA >60 07/25/2015 1138   GFRAA >60 01/22/2013 0348    No results found for: SPEP, UPEP  Lab Results  Component Value Date   WBC 4.5 08/22/2015   NEUTROABS 2.5 08/22/2015   HGB 8.0 Repeated and verified X2.* 08/22/2015   HCT 26.2 Repeated and verified X2.* 08/22/2015   MCV 67.4 Repeated and verified X2.* 08/22/2015   PLT 264.0 08/22/2015      Chemistry      Component Value Date/Time   NA 139 07/25/2015 1138   NA 139 01/22/2013 0348   K 4.1 07/25/2015 1138   K 4.2 01/22/2013 0348   CL 103 07/25/2015 1138   CL 106 01/22/2013 0348   CO2 27 07/25/2015 1138   CO2 28 01/22/2013 0348   BUN 14 07/25/2015 1138   BUN 16 01/22/2013 0348   CREATININE 1.03 07/25/2015 1138   CREATININE 1.02 01/22/2013 0348      Component Value Date/Time   CALCIUM 9.1 07/25/2015 1138  CALCIUM 8.6 01/22/2013 0348   ALKPHOS 104 06/25/2015 1045   ALKPHOS 143* 01/21/2013 0209   AST 30 06/25/2015 1045   AST 28 01/21/2013 0209   ALT 33 06/25/2015 1045   ALT 36 01/21/2013 0209   BILITOT 0.4 06/25/2015 1045   BILITOT 0.4 01/21/2013 0209        ASSESSMENT & PLAN:    Iron deficiency anemia due to chronic blood loss # Severe anemia- iron deficient-likely secondary to gastric losses.  Status post IV iron August 2019-ferritin 143 hemoglobin 12.  Stable.  Hold off any further IV iron.  This time  # Leucopenia/ neutropenia-intermittent.  ANC 1300.  Asymptomatic.  Monitor for now.  If worse than recommend a bone marrow biopsy.  # History of A. Fib- Coumadin as per cards.  Stable.  #On intermittent fatigue-unclear etiology.  #Hypertension: Elevated today; systolic 128N; recommend bring a log to PCP/cardiology.  #History of OSA-CPAP/Oxygen.  Stable.  # follow up in 6 months/cbc/iron studies few days prior/ IV ferrameh.

## 2018-02-04 NOTE — Assessment & Plan Note (Addendum)
#  Severe anemia- iron deficient-likely secondary to gastric losses.  Status post IV iron August 2019-ferritin 143 hemoglobin 12.  Stable.  Hold off any further IV iron.  This time  # Leucopenia/ neutropenia-intermittent.  ANC 1300.  Asymptomatic.  Monitor for now.  If worse than recommend a bone marrow biopsy.  # History of A. Fib- Coumadin as per cards.  Stable.  #On intermittent fatigue-unclear etiology.  #Hypertension: Elevated today; systolic 712J; recommend bring a log to PCP/cardiology.  #History of OSA-CPAP/Oxygen.  Stable.  # follow up in 6 months/cbc/iron studies few days prior/ IV ferrameh.

## 2018-02-04 NOTE — Progress Notes (Signed)
Here for follow up. Per pt "doing all right " feeling tired per wife who added info.

## 2018-02-14 ENCOUNTER — Ambulatory Visit (INDEPENDENT_AMBULATORY_CARE_PROVIDER_SITE_OTHER): Payer: PPO

## 2018-02-14 DIAGNOSIS — I4891 Unspecified atrial fibrillation: Secondary | ICD-10-CM | POA: Diagnosis not present

## 2018-02-14 DIAGNOSIS — I4819 Other persistent atrial fibrillation: Secondary | ICD-10-CM

## 2018-02-14 DIAGNOSIS — I481 Persistent atrial fibrillation: Secondary | ICD-10-CM

## 2018-02-14 DIAGNOSIS — Z7901 Long term (current) use of anticoagulants: Secondary | ICD-10-CM

## 2018-02-14 LAB — POCT INR: INR: 2.5 (ref 2.0–3.0)

## 2018-02-14 NOTE — Patient Instructions (Signed)
Please continue taking 1 tablet daily except 1/2 tablet on Tuesdays and Saturdays.   Recheck INR in 6 weeks.

## 2018-02-24 DIAGNOSIS — E113513 Type 2 diabetes mellitus with proliferative diabetic retinopathy with macular edema, bilateral: Secondary | ICD-10-CM | POA: Diagnosis not present

## 2018-02-24 DIAGNOSIS — D3132 Benign neoplasm of left choroid: Secondary | ICD-10-CM | POA: Diagnosis not present

## 2018-04-04 ENCOUNTER — Ambulatory Visit (INDEPENDENT_AMBULATORY_CARE_PROVIDER_SITE_OTHER): Payer: PPO

## 2018-04-04 DIAGNOSIS — Z7901 Long term (current) use of anticoagulants: Secondary | ICD-10-CM | POA: Diagnosis not present

## 2018-04-04 DIAGNOSIS — I4891 Unspecified atrial fibrillation: Secondary | ICD-10-CM

## 2018-04-04 LAB — POCT INR: INR: 3.4 — AB (ref 2.0–3.0)

## 2018-04-04 NOTE — Patient Instructions (Signed)
Please skip coumadin tonight, then continue taking 1 tablet daily except 1/2 tablet on Tuesdays and Saturdays.   Recheck INR in 4 weeks.

## 2018-04-05 DIAGNOSIS — E1165 Type 2 diabetes mellitus with hyperglycemia: Secondary | ICD-10-CM | POA: Diagnosis not present

## 2018-04-05 DIAGNOSIS — Z0000 Encounter for general adult medical examination without abnormal findings: Secondary | ICD-10-CM | POA: Diagnosis not present

## 2018-04-05 DIAGNOSIS — I350 Nonrheumatic aortic (valve) stenosis: Secondary | ICD-10-CM | POA: Diagnosis not present

## 2018-04-05 DIAGNOSIS — E538 Deficiency of other specified B group vitamins: Secondary | ICD-10-CM | POA: Diagnosis not present

## 2018-04-05 DIAGNOSIS — Z125 Encounter for screening for malignant neoplasm of prostate: Secondary | ICD-10-CM | POA: Diagnosis not present

## 2018-04-05 DIAGNOSIS — E1139 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: Secondary | ICD-10-CM | POA: Diagnosis not present

## 2018-04-08 ENCOUNTER — Ambulatory Visit: Payer: PPO | Admitting: Physician Assistant

## 2018-04-12 ENCOUNTER — Ambulatory Visit (INDEPENDENT_AMBULATORY_CARE_PROVIDER_SITE_OTHER): Payer: PPO | Admitting: Cardiovascular Disease

## 2018-04-12 ENCOUNTER — Encounter: Payer: Self-pay | Admitting: Cardiovascular Disease

## 2018-04-12 VITALS — BP 136/60 | HR 51 | Ht 67.5 in | Wt 192.0 lb

## 2018-04-12 DIAGNOSIS — I6523 Occlusion and stenosis of bilateral carotid arteries: Secondary | ICD-10-CM | POA: Diagnosis not present

## 2018-04-12 DIAGNOSIS — I4819 Other persistent atrial fibrillation: Secondary | ICD-10-CM | POA: Diagnosis not present

## 2018-04-12 DIAGNOSIS — E785 Hyperlipidemia, unspecified: Secondary | ICD-10-CM | POA: Diagnosis not present

## 2018-04-12 DIAGNOSIS — I25118 Atherosclerotic heart disease of native coronary artery with other forms of angina pectoris: Secondary | ICD-10-CM

## 2018-04-12 DIAGNOSIS — I350 Nonrheumatic aortic (valve) stenosis: Secondary | ICD-10-CM

## 2018-04-12 NOTE — Patient Instructions (Signed)
Medication Instructions:  No changes If you need a refill on your cardiac medications before your next appointment, please call your pharmacy.   Lab work: None ordered  Testing/Procedures: Your physician has requested that you have an echocardiogram. Echocardiography is a painless test that uses sound waves to create images of your heart. It provides your doctor with information about the size and shape of your heart and how well your heart's chambers and valves are working. You may receive an ultrasound enhancing agent through an IV if needed to better visualize your heart during the echo.This procedure takes approximately one hour. There are no restrictions for this procedure. This will take place at the [facility] clinic.    Follow-Up: At [facility], you and your health needs are our priority.  As part of our continuing mission to provide you with exceptional heart care, we have created designated Provider Care Teams.  These Care Teams include your primary Cardiologist (physician) and Advanced Practice Providers (APPs -  Physician Assistants and Nurse Practitioners) who all work together to provide you with the care you need, when you need it. You will need a follow up appointment in 6 months.  Please call our office 2 months in advance to schedule this appointment.  You may see Dr. [name] or one of the following Advanced Practice Providers on your designated Care Team:   Christopher Berge, NP Ryan Dunn, PA-C . Jacquelyn Visser, PA-C   

## 2018-04-12 NOTE — Progress Notes (Signed)
Cardiology Office Note   Date:  04/12/2018   ID:  Seth Hardy, DOB 06/25/1945, MRN [id number]  PCP:  Rusty Aus, MD  Cardiologist:   Kathlyn Sacramento, MD   Chief Complaint  Patient presents with  . Other    follow up per Dr Emily Filbert for Increased SOB and chest discomfort when walking. Meds reviewed verbally with patient.       History of Present Illness: Seth Hardy is a 72 y.o. male who Is here today for a follow-up visit . He has a history of coronary artery disease and is status CABG in September 2011 with subsequent bare metal stenting of the right coronary artery in 2013. He also has a history of persistent atrial fibrillation maintained in sinus rhythm on small dose amiodarone and is chronically anticoagulated on Coumadin. He has other chronic medical conditions that include aortic stenosis, hypertension, hyperlipidemia, bilateral carotid arterial disease status post left carotid endarterectomy in April 2017, and diabetes.   Most recent echocardiogram in July 2018 showed normal ejection fraction with mild aortic stenosis.  Mean gradient was 13 mmHg. He has known history of iron deficiency anemia due to gastric polyp which was resected.  Over the last few months, he has experienced worsening exertional shortness of breath with associated chest tightness that happens after walking about one quarter of a mile.  He does not have symptoms at rest.  He has been getting IV iron infusion for anemia and most recent hemoglobin was 12.  He denies any change in his weight.  Past Medical History:  Diagnosis Date  . Anemia 08/2015   received 2 units rbc one week ago, 3 IV iron infusion -last 1 week.  . Basal cell carcinoma of skin 2012   removed several spots from arms and back  . CAD (coronary artery disease)    a. 01/2010 CABG x 4: LIMA->LAD, VG->D1, VG->OM1, VG->PDA. b. NSTEMI 02/2012 in setting of AF-RVR with cath s/p BMS to RCA (plan for 1 month, possibly up to 3 months  of Plavix)  . Carotid disease, bilateral ([place])    a. 03/2011 U/S: 40-59% bilat Carotid dzs;  b. 04/2015 Carotid U/S: 40-59% bilat ICA stenosis; c. 05/2015 CTA Neck: 94% RICA, 49% LICA, mod-marked R vertebral stenosis, mod L vertebral stenosis.  . Chronic kidney disease    small obtusion per pcp. ultrasound done on 08/29/2015  . Diabetes mellitus type II, controlled ([place])    a. Variable CBG 02/2012 - several meds adjusted.  Marland Kitchen Dysrhythmia    intermittent Atrial Fibrillation  . GERD (gastroesophageal reflux disease)   . Hyperlipidemia   . Hypertension   . Hypertensive heart disease   . Macular edema bil   lazer work done previously  . Mild aortic stenosis    a. 03/2011 Echo: EF 55-60%, Mild AS. b. Mild by cath 02/2012; c. 01/2014 Echo: EF 65-70%, Gr 1 DD, mild AS, mildly dil LA.  . Multiple gastric ulcers 2006  . Myocardial infarction [facility]) 2014   stents (x1) at time  . Obesity   . PAF (paroxysmal atrial fibrillation) ([place])    a. Newly dx 02/2012 & initiated on Coumadin (spont converted to NSR).  . Shortness of breath dyspnea   . Sleep apnea    CPAP settings 3 with oxygen 2.5  . Transfusion history    transfusions -1 month ago -2 units    Past Surgical History:  Procedure Laterality Date  . ANGIOPLASTY    . CARDIAC  CATHETERIZATION  2014  . CATARACT EXTRACTION Bilateral 2016   Cataract Extraction with IOL  . COLONOSCOPY WITH PROPOFOL N/A 10/15/2015   Procedure: COLONOSCOPY WITH PROPOFOL;  Surgeon: Gatha Mayer, MD;  Location: [facility];  Service: Endoscopy;  Laterality: N/A;  . CORONARY ANGIOPLASTY     Dr. Fletcher Anon  . CORONARY ARTERY BYPASS GRAFT  2011   [place], [place], [place],   . cyst L kidney  [place]  . ENDARTERECTOMY Left 08/30/2015   Procedure: ENDARTERECTOMY CAROTID;  Surgeon: Katha Cabal, MD;  Location: [facility] ORS;  Service: Vascular;  Laterality: Left;  . ESOPHAGOGASTRODUODENOSCOPY (EGD) WITH PROPOFOL N/A 10/15/2015   Procedure:  ESOPHAGOGASTRODUODENOSCOPY (EGD) WITH PROPOFOL;  Surgeon: Gatha Mayer, MD;  [facility];  Service: Endoscopy;  Laterality: N/A;  . INGUINAL HERNIA REPAIR Right    Right Inguinal Hernia Repair  . LEFT HEART CATHETERIZATION WITH CORONARY ANGIOGRAM N/A 03/09/2012   Procedure: LEFT HEART CATHETERIZATION WITH CORONARY ANGIOGRAM;  Surgeon: Wellington Hampshire, MD;  Location: [place] CATH LAB;  Service: Cardiovascular;  Laterality: N/A;  . PERCUTANEOUS CORONARY STENT INTERVENTION (PCI-S)  03/09/2012   Procedure: PERCUTANEOUS CORONARY STENT INTERVENTION (PCI-S);  Surgeon: Wellington Hampshire, MD;  Location: [facility] CATH LAB;  Service: Cardiovascular;;     Current Outpatient Medications  Medication Sig Dispense Refill  . acetaminophen (TYLENOL) 325 MG tablet Take 650 mg by mouth every 6 (six) hours as needed. For pain    . amiodarone (PACERONE) 200 MG tablet TAKE 1/2 TABLET AT BEDTIME 45 tablet 0  . amLODipine (NORVASC) 5 MG tablet Take one tablet by mouth one time daily 90 tablet 3  . atorvastatin (LIPITOR) 20 MG tablet TAKE ONE TABLET AT BEDTIME 30 tablet 0  . Cyanocobalamin (B-12) 1000 MCG CAPS Take 1,000 mcg by mouth daily.     . furosemide (LASIX) 20 MG tablet Take 1 tablet (20 mg total) by mouth as needed for fluid or edema. 30 tablet 0  . glimepiride (AMARYL) 4 MG tablet Take 4 mg by mouth 2 (two) times daily.      . insulin glargine (LANTUS) 100 UNIT/ML injection Inject 20 Units into the skin daily.     . metFORMIN (GLUCOPHAGE) 500 MG tablet Take 500 mg by mouth 2 (two) times daily with a meal.     . NITROSTAT 0.4 MG SL tablet TAKE ONE TABLET UNDER TONGUE EVERY 5 MINUTES AS NEEDED UP TO 3 DOSES 25 tablet 0  . NOVOLOG FLEXPEN 100 UNIT/ML FlexPen     . Omega-3 Fatty Acids (FISH OIL) 1200 MG CAPS Take 1,200 mg by mouth daily. Reported on 08/27/2015    . OXYGEN Inhale 2.5 L/min into the lungs at bedtime. With CPAP    . pantoprazole (PROTONIX) 40 MG tablet Take 1 tablet (40 mg total) by mouth daily.  90 tablet 3  . ramipril (ALTACE) 10 MG capsule Take 10 mg by mouth daily.     Marland Kitchen warfarin (COUMADIN) 5 MG tablet Take 5 mg by mouth one time only at 6 PM.      No current facility-administered medications for this visit.     Allergies:   Patient has no known allergies.    Social History:  The patient  reports that he has never smoked. He has never used smokeless tobacco. He reports that he drinks alcohol. He reports that he does not use drugs.   Family History:  [name] family history includes Bladder Cancer in his mother; COPD in  his mother; Cancer in his other; Heart attack in his father; Kidney disease in his paternal uncle; Liver cancer in his maternal aunt; Lung cancer in his maternal aunt.    ROS:  Please see the history of present illness.   Otherwise, review of systems are positive for none.   All other systems are reviewed and negative.    PHYSICAL EXAM: VS:  BP 136/60 (BP Location: Left Arm, Patient Position: Sitting, Cuff Size: Normal)   Pulse (!) 51   Ht 5' 7.5" (1.715 m)   Wt 192 lb (87.1 kg)   BMI 29.63 kg/m  , BMI Body mass index is 29.63 kg/m. GEN: Well nourished, well developed, in no acute distress  HEENT: normal  Neck: no JVD, carotid bruits, or masses Cardiac: RRR; no rubs, or gallops, mild bilateral leg edema . There is a 3/6 crescendo decrescendo systolic murmur in the aortic area which is mid peaking.  S2 is diminished.  The murmur radiates to the carotid arteries. Respiratory:  clear to auscultation bilaterally, normal work of breathing GI: soft, nontender, nondistended, + BS MS: no deformity or atrophy  Skin: warm and dry, no rash Neuro:  Strength and sensation are intact Psych: euthymic mood, full affect   EKG:  EKG  ordered today. EKG showed sinus bradycardia with anterolateral T wave changes suggestive of ischemia.  These changes are chronic.   Recent Labs: 02/02/2018: ALT 29; BUN 18; Creatinine, Ser 1.05; Hemoglobin 12.0; Platelets 172;  Potassium 5.1; Sodium 140    Lipid Panel    Component Value Date/Time   CHOL 99 03/09/2012 0243   TRIG 84 03/09/2012 0243   HDL 37 (L) 03/09/2012 0243   CHOLHDL 2.7 03/09/2012 0243   VLDL 17 03/09/2012 0243   LDLCALC 45 03/09/2012 0243      Wt Readings from Last 3 Encounters:  04/12/18 192 lb (87.1 kg)  02/04/18 190 lb (86.2 kg)  07/26/17 190 lb (86.2 kg)        ASSESSMENT AND PLAN:  1.  Coronary artery disease involving bypass graft with class III angina: He now reports exertional chest pain or shortness of breath consistent with class III angina which started few months ago. The first step is to exclude progression of aortic stenosis and thus I requested an echocardiogram. Stress testing has not been helpful in the past with him due to attenuation artifacts and difficult images overall. Most likely he will require cardiac catheterization but will wait until we review the echocardiogram to see if he needs a right heart cath as well.  2. Bilateral carotid artery stenosis: Status post  left carotid endarterectomy. Followed by Dr. Delana Meyer.  3.  Mild to moderate aortic stenosis: I requested an echocardiogram as outlined above.  4. Persistent atrial fibrillation: Maintaining in sinus rhythm with small dose amiodarone.    Continue anticoagulation with warfarin.  Recent labs showed normal thyroid and liver function.  5. Hyperlipidemia: Continue treatment with atorvastatin .  I reviewed lipid profile from last year showed an LDL below 70.   Disposition:   Schedule an echocardiogram and further evaluation after that.  FU with me in 3 months   Signed,  Kathlyn Sacramento, MD  04/12/2018 3:37 PM    [facility]

## 2018-04-12 NOTE — H&P (View-Only) (Signed)
Cardiology Office Note   Date:  04/12/2018   ID:  [name], DOB Aug 23, 1945, MRN [id number]  PCP:  Rusty Aus, MD  Cardiologist:   Kathlyn Sacramento, MD   Chief Complaint  Patient presents with  . Other    follow up per Dr Emily Filbert for Increased SOB and chest discomfort when walking. Meds reviewed verbally with patient.       History of Present Illness: Seth Hardy is a 72 y.o. male who Is here today for a follow-up visit . He has a history of coronary artery disease and is status CABG in September 2011 with subsequent bare metal stenting of the right coronary artery in 2013. He also has a history of persistent atrial fibrillation maintained in sinus rhythm on small dose amiodarone and is chronically anticoagulated on Coumadin. He has other chronic medical conditions that include aortic stenosis, hypertension, hyperlipidemia, bilateral carotid arterial disease status post left carotid endarterectomy in April 2017, and diabetes.   Most recent echocardiogram in July 2018 showed normal ejection fraction with mild aortic stenosis.  Mean gradient was 13 mmHg. He has known history of iron deficiency anemia due to gastric polyp which was resected.  Over the last few months, he has experienced worsening exertional shortness of breath with associated chest tightness that happens after walking about one quarter of a mile.  He does not have symptoms at rest.  He has been getting IV iron infusion for anemia and most recent hemoglobin was 12.  He denies any change in his weight.  Past Medical History:  Diagnosis Date  . Anemia 08/2015   received 2 units rbc one week ago, 3 IV iron infusion -last 1 week.  . Basal cell carcinoma of skin 2012   removed several spots from arms and back  . CAD (coronary artery disease)    a. 01/2010 CABG x 4: LIMA->LAD, VG->D1, VG->OM1, VG->PDA. b. NSTEMI 02/2012 in setting of AF-RVR with cath s/p BMS to RCA (plan for 1 month, possibly up to 3 months  of Plavix)  . Carotid disease, bilateral ([place])    a. 03/2011 U/S: 40-59% bilat Carotid dzs;  b. 04/2015 Carotid U/S: 40-59% bilat ICA stenosis; c. 05/2015 CTA Neck: 24% RICA, 23% LICA, mod-marked R vertebral stenosis, mod L vertebral stenosis.  . Chronic kidney disease    small obtusion per pcp. ultrasound done on 08/29/2015  . Diabetes mellitus type II, controlled ([place])    a. Variable CBG 02/2012 - several meds adjusted.  Marland Kitchen Dysrhythmia    intermittent Atrial Fibrillation  . GERD (gastroesophageal reflux disease)   . Hyperlipidemia   . Hypertension   . Hypertensive heart disease   . Macular edema bil   lazer work done previously  . Mild aortic stenosis    a. 03/2011 Echo: EF 55-60%, Mild AS. b. Mild by cath 02/2012; c. 01/2014 Echo: EF 65-70%, Gr 1 DD, mild AS, mildly dil LA.  . Multiple gastric ulcers 2006  . Myocardial infarction [facility]) 2014   stents (x1) at time  . Obesity   . PAF (paroxysmal atrial fibrillation) ([place])    a. Newly dx 02/2012 & initiated on Coumadin (spont converted to NSR).  . Shortness of breath dyspnea   . Sleep apnea    CPAP settings 3 with oxygen 2.5  . Transfusion history    transfusions -1 month ago -2 units    Past Surgical History:  Procedure Laterality Date  . ANGIOPLASTY    . CARDIAC  CATHETERIZATION  2014  . CATARACT EXTRACTION Bilateral 2016   Cataract Extraction with IOL  . COLONOSCOPY WITH PROPOFOL N/A 10/15/2015   Procedure: COLONOSCOPY WITH PROPOFOL;  Surgeon: Gatha Mayer, MD;  [facility];  Service: Endoscopy;  Laterality: N/A;  . CORONARY ANGIOPLASTY     Dr. Fletcher Anon  . CORONARY ARTERY BYPASS GRAFT  2011   [place], [place], [place],   . cyst L kidney  [place]  . ENDARTERECTOMY Left 08/30/2015   Procedure: ENDARTERECTOMY CAROTID;  Surgeon: Katha Cabal, MD;  Location: [facility] ORS;  Service: Vascular;  Laterality: Left;  . ESOPHAGOGASTRODUODENOSCOPY (EGD) WITH PROPOFOL N/A 10/15/2015   Procedure:  ESOPHAGOGASTRODUODENOSCOPY (EGD) WITH PROPOFOL;  Surgeon: Gatha Mayer, MD;  [facility];  Service: Endoscopy;  Laterality: N/A;  . INGUINAL HERNIA REPAIR Right    Right Inguinal Hernia Repair  . LEFT HEART CATHETERIZATION WITH CORONARY ANGIOGRAM N/A 03/09/2012   Procedure: LEFT HEART CATHETERIZATION WITH CORONARY ANGIOGRAM;  Surgeon: Wellington Hampshire, MD;  Location: [place] CATH LAB;  Service: Cardiovascular;  Laterality: N/A;  . PERCUTANEOUS CORONARY STENT INTERVENTION (PCI-S)  03/09/2012   Procedure: PERCUTANEOUS CORONARY STENT INTERVENTION (PCI-S);  Surgeon: Wellington Hampshire, MD;  Location: [facility] CATH LAB;  Service: Cardiovascular;;     Current Outpatient Medications  Medication Sig Dispense Refill  . acetaminophen (TYLENOL) 325 MG tablet Take 650 mg by mouth every 6 (six) hours as needed. For pain    . amiodarone (PACERONE) 200 MG tablet TAKE 1/2 TABLET AT BEDTIME 45 tablet 0  . amLODipine (NORVASC) 5 MG tablet Take one tablet by mouth one time daily 90 tablet 3  . atorvastatin (LIPITOR) 20 MG tablet TAKE ONE TABLET AT BEDTIME 30 tablet 0  . Cyanocobalamin (B-12) 1000 MCG CAPS Take 1,000 mcg by mouth daily.     . furosemide (LASIX) 20 MG tablet Take 1 tablet (20 mg total) by mouth as needed for fluid or edema. 30 tablet 0  . glimepiride (AMARYL) 4 MG tablet Take 4 mg by mouth 2 (two) times daily.      . insulin glargine (LANTUS) 100 UNIT/ML injection Inject 20 Units into the skin daily.     . metFORMIN (GLUCOPHAGE) 500 MG tablet Take 500 mg by mouth 2 (two) times daily with a meal.     . NITROSTAT 0.4 MG SL tablet TAKE ONE TABLET UNDER TONGUE EVERY 5 MINUTES AS NEEDED UP TO 3 DOSES 25 tablet 0  . NOVOLOG FLEXPEN 100 UNIT/ML FlexPen     . Omega-3 Fatty Acids (FISH OIL) 1200 MG CAPS Take 1,200 mg by mouth daily. Reported on 08/27/2015    . OXYGEN Inhale 2.5 L/min into the lungs at bedtime. With CPAP    . pantoprazole (PROTONIX) 40 MG tablet Take 1 tablet (40 mg total) by mouth daily.  90 tablet 3  . ramipril (ALTACE) 10 MG capsule Take 10 mg by mouth daily.     Marland Kitchen warfarin (COUMADIN) 5 MG tablet Take 5 mg by mouth one time only at 6 PM.      No current facility-administered medications for this visit.     Allergies:   Patient has no known allergies.    Social History:  The patient  reports that he has never smoked. He has never used smokeless tobacco. He reports that he drinks alcohol. He reports that he does not use drugs.   Family History:  [name] family history includes Bladder Cancer in his mother; COPD in  his mother; Cancer in his other; Heart attack in his father; Kidney disease in his paternal uncle; Liver cancer in his maternal aunt; Lung cancer in his maternal aunt.    ROS:  Please see the history of present illness.   Otherwise, review of systems are positive for none.   All other systems are reviewed and negative.    PHYSICAL EXAM: VS:  BP 136/60 (BP Location: Left Arm, Patient Position: Sitting, Cuff Size: Normal)   Pulse (!) 51   Ht 5' 7.5" (1.715 m)   Wt 192 lb (87.1 kg)   BMI 29.63 kg/m  , BMI Body mass index is 29.63 kg/m. GEN: Well nourished, well developed, in no acute distress  HEENT: normal  Neck: no JVD, carotid bruits, or masses Cardiac: RRR; no rubs, or gallops, mild bilateral leg edema . There is a 3/6 crescendo decrescendo systolic murmur in the aortic area which is mid peaking.  S2 is diminished.  The murmur radiates to the carotid arteries. Respiratory:  clear to auscultation bilaterally, normal work of breathing GI: soft, nontender, nondistended, + BS MS: no deformity or atrophy  Skin: warm and dry, no rash Neuro:  Strength and sensation are intact Psych: euthymic mood, full affect   EKG:  EKG  ordered today. EKG showed sinus bradycardia with anterolateral T wave changes suggestive of ischemia.  These changes are chronic.   Recent Labs: 02/02/2018: ALT 29; BUN 18; Creatinine, Ser 1.05; Hemoglobin 12.0; Platelets 172;  Potassium 5.1; Sodium 140    Lipid Panel    Component Value Date/Time   CHOL 99 03/09/2012 0243   TRIG 84 03/09/2012 0243   HDL 37 (L) 03/09/2012 0243   CHOLHDL 2.7 03/09/2012 0243   VLDL 17 03/09/2012 0243   LDLCALC 45 03/09/2012 0243      Wt Readings from Last 3 Encounters:  04/12/18 192 lb (87.1 kg)  02/04/18 190 lb (86.2 kg)  07/26/17 190 lb (86.2 kg)        ASSESSMENT AND PLAN:  1.  Coronary artery disease involving bypass graft with class III angina: He now reports exertional chest pain or shortness of breath consistent with class III angina which started few months ago. The first step is to exclude progression of aortic stenosis and thus I requested an echocardiogram. Stress testing has not been helpful in the past with him due to attenuation artifacts and difficult images overall. Most likely he will require cardiac catheterization but will wait until we review the echocardiogram to see if he needs a right heart cath as well.  2. Bilateral carotid artery stenosis: Status post  left carotid endarterectomy. Followed by Dr. Delana Meyer.  3.  Mild to moderate aortic stenosis: I requested an echocardiogram as outlined above.  4. Persistent atrial fibrillation: Maintaining in sinus rhythm with small dose amiodarone.    Continue anticoagulation with warfarin.  Recent labs showed normal thyroid and liver function.  5. Hyperlipidemia: Continue treatment with atorvastatin .  I reviewed lipid profile from last year showed an LDL below 70.   Disposition:   Schedule an echocardiogram and further evaluation after that.  FU with me in 3 months   Signed,  Kathlyn Sacramento, MD  04/12/2018 3:37 PM    [facility]

## 2018-04-12 NOTE — H&P (View-Only) (Signed)
Cardiology Office Note   Date:  04/12/2018   ID:  Seth Hardy, DOB April 17, 1946, MRN [id number]  PCP:  Rusty Aus, MD  Cardiologist:   Kathlyn Sacramento, MD   Chief Complaint  Patient presents with  . Other    follow up per Dr Emily Filbert for Increased SOB and chest discomfort when walking. Meds reviewed verbally with patient.       History of Present Illness: Seth Hardy is a 72 y.o. male who Is here today for a follow-up visit . He has a history of coronary artery disease and is status CABG in September 2011 with subsequent bare metal stenting of the right coronary artery in 2013. He also has a history of persistent atrial fibrillation maintained in sinus rhythm on small dose amiodarone and is chronically anticoagulated on Coumadin. He has other chronic medical conditions that include aortic stenosis, hypertension, hyperlipidemia, bilateral carotid arterial disease status post left carotid endarterectomy in April 2017, and diabetes.   Most recent echocardiogram in July 2018 showed normal ejection fraction with mild aortic stenosis.  Mean gradient was 13 mmHg. He has known history of iron deficiency anemia due to gastric polyp which was resected.  Over the last few months, he has experienced worsening exertional shortness of breath with associated chest tightness that happens after walking about one quarter of a mile.  He does not have symptoms at rest.  He has been getting IV iron infusion for anemia and most recent hemoglobin was 12.  He denies any change in his weight.  Past Medical History:  Diagnosis Date  . Anemia 08/2015   received 2 units rbc one week ago, 3 IV iron infusion -last 1 week.  . Basal cell carcinoma of skin 2012   removed several spots from arms and back  . CAD (coronary artery disease)    a. 01/2010 CABG x 4: LIMA->LAD, VG->D1, VG->OM1, VG->PDA. b. NSTEMI 02/2012 in setting of AF-RVR with cath s/p BMS to RCA (plan for 1 month, possibly up to 3 months  of Plavix)  . Carotid disease, bilateral ([place])    a. 03/2011 U/S: 40-59% bilat Carotid dzs;  b. 04/2015 Carotid U/S: 40-59% bilat ICA stenosis; c. 05/2015 CTA Neck: 56% RICA, 21% LICA, mod-marked R vertebral stenosis, mod L vertebral stenosis.  . Chronic kidney disease    small obtusion per pcp. ultrasound done on 08/29/2015  . Diabetes mellitus type II, controlled ([place])    a. Variable CBG 02/2012 - several meds adjusted.  Marland Kitchen Dysrhythmia    intermittent Atrial Fibrillation  . GERD (gastroesophageal reflux disease)   . Hyperlipidemia   . Hypertension   . Hypertensive heart disease   . Macular edema bil   lazer work done previously  . Mild aortic stenosis    a. 03/2011 Echo: EF 55-60%, Mild AS. b. Mild by cath 02/2012; c. 01/2014 Echo: EF 65-70%, Gr 1 DD, mild AS, mildly dil LA.  . Multiple gastric ulcers 2006  . Myocardial infarction [facility]) 2014   stents (x1) at time  . Obesity   . PAF (paroxysmal atrial fibrillation) ([place])    a. Newly dx 02/2012 & initiated on Coumadin (spont converted to NSR).  . Shortness of breath dyspnea   . Sleep apnea    CPAP settings 3 with oxygen 2.5  . Transfusion history    transfusions -1 month ago -2 units    Past Surgical History:  Procedure Laterality Date  . ANGIOPLASTY    . CARDIAC  CATHETERIZATION  2014  . CATARACT EXTRACTION Bilateral 2016   Cataract Extraction with IOL  . COLONOSCOPY WITH PROPOFOL N/A 10/15/2015   Procedure: COLONOSCOPY WITH PROPOFOL;  Surgeon: Gatha Mayer, MD;  Location: [facility] ENDOSCOPY;  Service: Endoscopy;  Laterality: N/A;  . CORONARY ANGIOPLASTY     Dr. Fletcher Anon  . CORONARY ARTERY BYPASS GRAFT  2011   [place], [place], [place],   . cyst L kidney  [place]  . ENDARTERECTOMY Left 08/30/2015   Procedure: ENDARTERECTOMY CAROTID;  Surgeon: Katha Cabal, MD;  Location: [facility] ORS;  Service: Vascular;  Laterality: Left;  . ESOPHAGOGASTRODUODENOSCOPY (EGD) WITH PROPOFOL N/A 10/15/2015   Procedure:  ESOPHAGOGASTRODUODENOSCOPY (EGD) WITH PROPOFOL;  Surgeon: Gatha Mayer, MD;  Location: [facility] ENDOSCOPY;  Service: Endoscopy;  Laterality: N/A;  . INGUINAL HERNIA REPAIR Right    Right Inguinal Hernia Repair  . LEFT HEART CATHETERIZATION WITH CORONARY ANGIOGRAM N/A 03/09/2012   Procedure: LEFT HEART CATHETERIZATION WITH CORONARY ANGIOGRAM;  Surgeon: Wellington Hampshire, MD;  Location: [facility] CATH LAB;  Service: Cardiovascular;  Laterality: N/A;  . PERCUTANEOUS CORONARY STENT INTERVENTION (PCI-S)  03/09/2012   Procedure: PERCUTANEOUS CORONARY STENT INTERVENTION (PCI-S);  Surgeon: Wellington Hampshire, MD;  Location: [facility] CATH LAB;  Service: Cardiovascular;;     Current Outpatient Medications  Medication Sig Dispense Refill  . acetaminophen (TYLENOL) 325 MG tablet Take 650 mg by mouth every 6 (six) hours as needed. For pain    . amiodarone (PACERONE) 200 MG tablet TAKE 1/2 TABLET AT BEDTIME 45 tablet 0  . amLODipine (NORVASC) 5 MG tablet Take one tablet by mouth one time daily 90 tablet 3  . atorvastatin (LIPITOR) 20 MG tablet TAKE ONE TABLET AT BEDTIME 30 tablet 0  . Cyanocobalamin (B-12) 1000 MCG CAPS Take 1,000 mcg by mouth daily.     . furosemide (LASIX) 20 MG tablet Take 1 tablet (20 mg total) by mouth as needed for fluid or edema. 30 tablet 0  . glimepiride (AMARYL) 4 MG tablet Take 4 mg by mouth 2 (two) times daily.      . insulin glargine (LANTUS) 100 UNIT/ML injection Inject 20 Units into the skin daily.     . metFORMIN (GLUCOPHAGE) 500 MG tablet Take 500 mg by mouth 2 (two) times daily with a meal.     . NITROSTAT 0.4 MG SL tablet TAKE ONE TABLET UNDER TONGUE EVERY 5 MINUTES AS NEEDED UP TO 3 DOSES 25 tablet 0  . NOVOLOG FLEXPEN 100 UNIT/ML FlexPen     . Omega-3 Fatty Acids (FISH OIL) 1200 MG CAPS Take 1,200 mg by mouth daily. Reported on 08/27/2015    . OXYGEN Inhale 2.5 L/min into the lungs at bedtime. With CPAP    . pantoprazole (PROTONIX) 40 MG tablet Take 1 tablet (40 mg total) by mouth daily.  90 tablet 3  . ramipril (ALTACE) 10 MG capsule Take 10 mg by mouth daily.     Marland Kitchen warfarin (COUMADIN) 5 MG tablet Take 5 mg by mouth one time only at 6 PM.      No current facility-administered medications for this visit.     Allergies:   Patient has no known allergies.    Social History:  The patient  reports that he has never smoked. He has never used smokeless tobacco. He reports that he drinks alcohol. He reports that he does not use drugs.   Family History:  [name] family history includes Bladder Cancer in his mother; COPD in  his mother; Cancer in his other; Heart attack in his father; Kidney disease in his paternal uncle; Liver cancer in his maternal aunt; Lung cancer in his maternal aunt.    ROS:  Please see the history of present illness.   Otherwise, review of systems are positive for none.   All other systems are reviewed and negative.    PHYSICAL EXAM: VS:  BP 136/60 (BP Location: Left Arm, Patient Position: Sitting, Cuff Size: Normal)   Pulse (!) 51   Ht 5' 7.5" (1.715 m)   Wt 192 lb (87.1 kg)   BMI 29.63 kg/m  , BMI Body mass index is 29.63 kg/m. GEN: Well nourished, well developed, in no acute distress  HEENT: normal  Neck: no JVD, carotid bruits, or masses Cardiac: RRR; no rubs, or gallops, mild bilateral leg edema . There is a 3/6 crescendo decrescendo systolic murmur in the aortic area which is mid peaking.  S2 is diminished.  The murmur radiates to the carotid arteries. Respiratory:  clear to auscultation bilaterally, normal work of breathing GI: soft, nontender, nondistended, + BS MS: no deformity or atrophy  Skin: warm and dry, no rash Neuro:  Strength and sensation are intact Psych: euthymic mood, full affect   EKG:  EKG  ordered today. EKG showed sinus bradycardia with anterolateral T wave changes suggestive of ischemia.  These changes are chronic.   Recent Labs: 02/02/2018: ALT 29; BUN 18; Creatinine, Ser 1.05; Hemoglobin 12.0; Platelets 172;  Potassium 5.1; Sodium 140    Lipid Panel    Component Value Date/Time   CHOL 99 03/09/2012 0243   TRIG 84 03/09/2012 0243   HDL 37 (L) 03/09/2012 0243   CHOLHDL 2.7 03/09/2012 0243   VLDL 17 03/09/2012 0243   LDLCALC 45 03/09/2012 0243      Wt Readings from Last 3 Encounters:  04/12/18 192 lb (87.1 kg)  02/04/18 190 lb (86.2 kg)  07/26/17 190 lb (86.2 kg)        ASSESSMENT AND PLAN:  1.  Coronary artery disease involving bypass graft with class III angina: He now reports exertional chest pain or shortness of breath consistent with class III angina which started few months ago. The first step is to exclude progression of aortic stenosis and thus I requested an echocardiogram. Stress testing has not been helpful in the past with him due to attenuation artifacts and difficult images overall. Most likely he will require cardiac catheterization but will wait until we review the echocardiogram to see if he needs a right heart cath as well.  2. Bilateral carotid artery stenosis: Status post  left carotid endarterectomy. Followed by Dr. Delana Meyer.  3.  Mild to moderate aortic stenosis: I requested an echocardiogram as outlined above.  4. Persistent atrial fibrillation: Maintaining in sinus rhythm with small dose amiodarone.    Continue anticoagulation with warfarin.  Recent labs showed normal thyroid and liver function.  5. Hyperlipidemia: Continue treatment with atorvastatin .  I reviewed lipid profile from last year showed an LDL below 70.   Disposition:   Schedule an echocardiogram and further evaluation after that.  FU with me in 3 months   Signed,  Kathlyn Sacramento, MD  04/12/2018 3:37 PM    [facility]

## 2018-04-13 ENCOUNTER — Ambulatory Visit (INDEPENDENT_AMBULATORY_CARE_PROVIDER_SITE_OTHER): Payer: PPO

## 2018-04-13 DIAGNOSIS — I350 Nonrheumatic aortic (valve) stenosis: Secondary | ICD-10-CM

## 2018-04-13 MED ORDER — PERFLUTREN LIPID MICROSPHERE
1.0000 mL | INTRAVENOUS | Status: AC | PRN
  Administered 2018-04-13: 2 mL via INTRAVENOUS

## 2018-04-14 ENCOUNTER — Telehealth: Payer: Self-pay | Admitting: Cardiovascular Disease

## 2018-04-14 NOTE — Telephone Encounter (Signed)
Pt wife would like echo results.

## 2018-04-15 ENCOUNTER — Encounter: Payer: Self-pay | Admitting: *Deleted

## 2018-04-15 NOTE — Telephone Encounter (Signed)
Patient made aware of results and verbalized understanding.  Notes recorded by Wellington Hampshire, MD on 04/14/2018 at 1:48 PM EST Inform patient that echo showed normal ejection fraction. Aortic stenosis is worse than before and now is in the moderate to severe range. I recommend scheduling a right and left cardiac catheterization to evaluate his aortic valve and also his anginal symptoms. I really discussed with him during our most recent visit. Hold warfarin 5 days before catheterization.  Cardiac cath scheduled for 04/25/18 at 9:30 with Dr. Fletcher Anon. Labs are in [place], completed on 04/05/18  The patient takes his Lantus in the mornings. He has been advised that he may take the normal amount the morning prior to the procedure but hold the morning of. Patient has verbalized his understanding. Instructions have been mailed and sent to [place].   Hold Furosemide the morning of the procedure Hold all diabetic medication the morning of the procedure. Hold Metformin the morning of the procedure and 48 hours after. Hold warfarin 5 days prior to the procedure. Stop taking it on 04/20/18

## 2018-04-20 MED ORDER — NITROSTAT 0.4 MG SL SUBL: 0.4000 mg | SUBLINGUAL_TABLET | SUBLINGUAL | 1 refills | Status: DC | PRN

## 2018-04-20 NOTE — Telephone Encounter (Signed)
Patient called in stating that he experienced left shoulder pain today while at the grocery store. He stated that it lasted 30 minutes and was a level of 3/10. While on the phone he stated that the pain was now subsiding to a 1/10. He stated that the pain did not radiate and was only in his shoulder. He denied any other symptoms.   Per DOD, the patient should take a nitroglycerin if the pain comes back and try to relax this weekend. He is scheduled for a catheterization on Monday with Dr. Fletcher Anon.   He verbalized that he will go to the ED if his symptoms persist. Nitroglycerin has been refilled for him.

## 2018-04-20 NOTE — Telephone Encounter (Signed)
Patient calling Patient scheduled for cath on 12/2 and has questions Would like to speak with nurse  Transferred to [facility]

## 2018-04-20 NOTE — Addendum Note (Signed)
Addended by: Ricci Barker on: 04/20/2018 05:24 PM   Modules accepted: Orders

## 2018-04-25 ENCOUNTER — Encounter
Admission: RE | Disposition: A | Discharge status: Home / Self Care | Payer: Self-pay | Source: Ambulatory Visit | Attending: Cardiovascular Disease

## 2018-04-25 ENCOUNTER — Other Ambulatory Visit: Payer: Self-pay

## 2018-04-25 ENCOUNTER — Ambulatory Visit
Admission: RE | Admit: 2018-04-25 | Discharge: 2018-04-25 | Disposition: A | Discharge status: Home / Self Care | Payer: PPO | Source: Ambulatory Visit | Attending: Cardiovascular Disease | Admitting: Cardiovascular Disease

## 2018-04-25 ENCOUNTER — Telehealth: Payer: Self-pay | Admitting: Cardiovascular Disease

## 2018-04-25 DIAGNOSIS — G4730 Sleep apnea, unspecified: Secondary | ICD-10-CM | POA: Insufficient documentation

## 2018-04-25 DIAGNOSIS — I208 Other forms of angina pectoris: Secondary | ICD-10-CM

## 2018-04-25 DIAGNOSIS — I2584 Coronary atherosclerosis due to calcified coronary lesion: Secondary | ICD-10-CM | POA: Diagnosis not present

## 2018-04-25 DIAGNOSIS — I4819 Other persistent atrial fibrillation: Secondary | ICD-10-CM | POA: Insufficient documentation

## 2018-04-25 DIAGNOSIS — Z841 Family history of disorders of kidney and ureter: Secondary | ICD-10-CM | POA: Insufficient documentation

## 2018-04-25 DIAGNOSIS — E669 Obesity, unspecified: Secondary | ICD-10-CM | POA: Diagnosis not present

## 2018-04-25 DIAGNOSIS — Z955 Presence of coronary angioplasty implant and graft: Secondary | ICD-10-CM | POA: Insufficient documentation

## 2018-04-25 DIAGNOSIS — Z7901 Long term (current) use of anticoagulants: Secondary | ICD-10-CM | POA: Insufficient documentation

## 2018-04-25 DIAGNOSIS — I2510 Atherosclerotic heart disease of native coronary artery without angina pectoris: Secondary | ICD-10-CM | POA: Insufficient documentation

## 2018-04-25 DIAGNOSIS — Z8249 Family history of ischemic heart disease and other diseases of the circulatory system: Secondary | ICD-10-CM | POA: Insufficient documentation

## 2018-04-25 DIAGNOSIS — Z9842 Cataract extraction status, left eye: Secondary | ICD-10-CM | POA: Diagnosis not present

## 2018-04-25 DIAGNOSIS — E1122 Type 2 diabetes mellitus with diabetic chronic kidney disease: Secondary | ICD-10-CM | POA: Diagnosis not present

## 2018-04-25 DIAGNOSIS — Z9841 Cataract extraction status, right eye: Secondary | ICD-10-CM | POA: Insufficient documentation

## 2018-04-25 DIAGNOSIS — I25118 Atherosclerotic heart disease of native coronary artery with other forms of angina pectoris: Secondary | ICD-10-CM

## 2018-04-25 DIAGNOSIS — I6523 Occlusion and stenosis of bilateral carotid arteries: Secondary | ICD-10-CM | POA: Insufficient documentation

## 2018-04-25 DIAGNOSIS — Z9981 Dependence on supplemental oxygen: Secondary | ICD-10-CM | POA: Insufficient documentation

## 2018-04-25 DIAGNOSIS — Z85828 Personal history of other malignant neoplasm of skin: Secondary | ICD-10-CM | POA: Insufficient documentation

## 2018-04-25 DIAGNOSIS — I25810 Atherosclerosis of coronary artery bypass graft(s) without angina pectoris: Secondary | ICD-10-CM | POA: Insufficient documentation

## 2018-04-25 DIAGNOSIS — I1310 Hypertensive heart and chronic kidney disease without heart failure, with stage 1 through stage 4 chronic kidney disease, or unspecified chronic kidney disease: Secondary | ICD-10-CM | POA: Insufficient documentation

## 2018-04-25 DIAGNOSIS — K219 Gastro-esophageal reflux disease without esophagitis: Secondary | ICD-10-CM | POA: Diagnosis not present

## 2018-04-25 DIAGNOSIS — E785 Hyperlipidemia, unspecified: Secondary | ICD-10-CM | POA: Insufficient documentation

## 2018-04-25 DIAGNOSIS — Z9889 Other specified postprocedural states: Secondary | ICD-10-CM | POA: Insufficient documentation

## 2018-04-25 DIAGNOSIS — I2089 Other forms of angina pectoris: Secondary | ICD-10-CM

## 2018-04-25 DIAGNOSIS — Z6829 Body mass index (BMI) 29.0-29.9, adult: Secondary | ICD-10-CM | POA: Insufficient documentation

## 2018-04-25 DIAGNOSIS — Z794 Long term (current) use of insulin: Secondary | ICD-10-CM | POA: Diagnosis not present

## 2018-04-25 DIAGNOSIS — I350 Nonrheumatic aortic (valve) stenosis: Secondary | ICD-10-CM | POA: Diagnosis not present

## 2018-04-25 DIAGNOSIS — I252 Old myocardial infarction: Secondary | ICD-10-CM | POA: Insufficient documentation

## 2018-04-25 DIAGNOSIS — N189 Chronic kidney disease, unspecified: Secondary | ICD-10-CM | POA: Diagnosis not present

## 2018-04-25 DIAGNOSIS — Z79899 Other long term (current) drug therapy: Secondary | ICD-10-CM | POA: Insufficient documentation

## 2018-04-25 HISTORY — PX: RIGHT/LEFT HEART CATH AND CORONARY ANGIOGRAPHY: CATH118266

## 2018-04-25 LAB — GLUCOSE, CAPILLARY
Glucose-Capillary: 126 mg/dL — ABNORMAL HIGH (ref 70–99)
Glucose-Capillary: 130 mg/dL — ABNORMAL HIGH (ref 70–99)

## 2018-04-25 SURGERY — RIGHT/LEFT HEART CATH AND CORONARY ANGIOGRAPHY
Anesthesia: Moderate Sedation

## 2018-04-25 MED ORDER — VERAPAMIL HCL 2.5 MG/ML IV SOLN
INTRAVENOUS | Status: DC | PRN
  Administered 2018-04-25: 2.5 mg via INTRA_ARTERIAL

## 2018-04-25 MED ORDER — HEPARIN SODIUM (PORCINE) 1000 UNIT/ML IJ SOLN
INTRAMUSCULAR | Status: AC
  Filled 2018-04-25: qty 1

## 2018-04-25 MED ORDER — HEPARIN (PORCINE) IN NACL 1000-0.9 UT/500ML-% IV SOLN
INTRAVENOUS | Status: AC
  Filled 2018-04-25: qty 1000

## 2018-04-25 MED ORDER — FENTANYL CITRATE (PF) 100 MCG/2ML IJ SOLN
INTRAMUSCULAR | Status: AC
  Filled 2018-04-25: qty 2

## 2018-04-25 MED ORDER — MIDAZOLAM HCL 2 MG/2ML IJ SOLN
INTRAMUSCULAR | Status: DC | PRN
  Administered 2018-04-25: 1 mg via INTRAVENOUS

## 2018-04-25 MED ORDER — FENTANYL CITRATE (PF) 100 MCG/2ML IJ SOLN
INTRAMUSCULAR | Status: DC | PRN
  Administered 2018-04-25: 50 ug via INTRAVENOUS

## 2018-04-25 MED ORDER — SODIUM CHLORIDE 0.9 % IV SOLN
INTRAVENOUS | Status: DC
  Administered 2018-04-25: 1000 mL via INTRAVENOUS

## 2018-04-25 MED ORDER — SODIUM CHLORIDE 0.9% FLUSH: 3.0000 mL | Freq: Two times a day (BID) | INTRAVENOUS | Status: DC

## 2018-04-25 MED ORDER — SODIUM CHLORIDE 0.9 % IV SOLN: 250.0000 mL | INTRAVENOUS | Status: DC | PRN

## 2018-04-25 MED ORDER — SODIUM CHLORIDE 0.9% FLUSH: 3.0000 mL | INTRAVENOUS | Status: DC | PRN

## 2018-04-25 MED ORDER — SODIUM CHLORIDE 0.9 % IV SOLN: INTRAVENOUS | Status: DC

## 2018-04-25 MED ORDER — FUROSEMIDE 20 MG PO TABS: 20.0000 mg | ORAL_TABLET | Freq: Every day | ORAL | 6 refills | Status: DC

## 2018-04-25 MED ORDER — IOPAMIDOL (ISOVUE-300) INJECTION 61%
INTRAVENOUS | Status: DC | PRN
  Administered 2018-04-25: 95 mL via INTRA_ARTERIAL

## 2018-04-25 MED ORDER — MIDAZOLAM HCL 2 MG/2ML IJ SOLN
INTRAMUSCULAR | Status: AC
  Filled 2018-04-25: qty 2

## 2018-04-25 MED ORDER — VERAPAMIL HCL 2.5 MG/ML IV SOLN
INTRAVENOUS | Status: AC
  Filled 2018-04-25: qty 2

## 2018-04-25 MED ORDER — CLOPIDOGREL BISULFATE 75 MG PO TABS: 75.0000 mg | ORAL_TABLET | Freq: Every day | ORAL | 5 refills | Status: DC

## 2018-04-25 MED ORDER — HEPARIN SODIUM (PORCINE) 1000 UNIT/ML IJ SOLN
INTRAMUSCULAR | Status: DC | PRN
  Administered 2018-04-25: 4500 [IU] via INTRAVENOUS

## 2018-04-25 MED ORDER — ONDANSETRON HCL 4 MG/2ML IJ SOLN: 4.0000 mg | Freq: Four times a day (QID) | INTRAMUSCULAR | Status: DC | PRN

## 2018-04-25 MED ORDER — ACETAMINOPHEN 325 MG PO TABS: 650.0000 mg | ORAL_TABLET | ORAL | Status: DC | PRN

## 2018-04-25 MED ORDER — ASPIRIN 81 MG PO CHEW: 81.0000 mg | CHEWABLE_TABLET | ORAL | Status: DC

## 2018-04-25 SURGICAL SUPPLY — 15 items
CATH AMP RT 5F (CATHETERS) ×1 IMPLANT
CATH INFINITI 5 FR JL3.5 (CATHETERS) ×1 IMPLANT
CATH INFINITI 5FR JL4 (CATHETERS) ×2 IMPLANT
CATH INFINITI 5FR TG (CATHETERS) ×2 IMPLANT
CATH SWANZ 7F THERMO (CATHETERS) ×1 IMPLANT
DEVICE RAD TR BAND REGULAR (VASCULAR PRODUCTS) ×2 IMPLANT
GLIDESHEATH SLEND SS 6F .021 (SHEATH) ×2 IMPLANT
KIT MANI 3VAL PERCEP (MISCELLANEOUS) ×2 IMPLANT
KIT RIGHT HEART (MISCELLANEOUS) ×2 IMPLANT
NDL PERC 18GX7CM (NEEDLE) IMPLANT
NEEDLE PERC 18GX7CM (NEEDLE) ×2 IMPLANT
PACK CARDIAC CATH (CUSTOM PROCEDURE TRAY) ×2 IMPLANT
SHEATH AVANTI 7FRX11 (SHEATH) ×1 IMPLANT
SHEATH GLIDE SLENDER 4/5FR (SHEATH) IMPLANT
WIRE ROSEN-J .035X260CM (WIRE) ×1 IMPLANT

## 2018-04-25 NOTE — Discharge Instructions (Signed)
°  Start taking furosemide 20 mg daily and not as needed. Start Plavix tomorrow.

## 2018-04-25 NOTE — Interval H&P Note (Signed)
History and Physical Interval Note:  04/25/2018 9:40 AM  Seth Hardy  has presented today for surgery, with the diagnosis of RT LT Heart Cath   Aortic Stenosis  The various methods of treatment have been discussed with the patient and family. After consideration of risks, benefits and other options for treatment, the patient has consented to  Procedure(s): RIGHT/LEFT HEART CATH AND CORONARY ANGIOGRAPHY (N/A) as a surgical intervention .  [name] history has been reviewed, patient examined, no change in status, stable for surgery.  I have reviewed the patient's chart and labs.  Questions were answered to the patient's satisfaction.     Kathlyn Sacramento

## 2018-04-25 NOTE — Telephone Encounter (Signed)
[name] wife called in stating that she was at the pharmacy now for the new medications. [facility] pharmacy is unable to take e-scripts at the moment.    A verbal order was given to [facility] for: Furosemide 20 mg daily with 30 tablets and 2 refills  Plavix 75 mg daily with 30 tablets and 5 refills. The patient will take a loading dose starting tomorrow of 300 mg and then after that 75 mg daily.   Call placed to the patient with instructions on the new medications. He has verbalized his understanding. He has been informed that we will reach out to him tomorrow with instructions for the procedure on Wednesday. In the meantime he has been instructed to get his labs drawn tomorrow at the [facility] medical mall.

## 2018-04-25 NOTE — Telephone Encounter (Signed)
See cath note

## 2018-04-25 NOTE — Telephone Encounter (Signed)
Schedule him for right coronary artery atherectomy and stent placement on Wednesday to be my 4th case.  Continue to hold warfarin. Start Plavix 75 mg once daily with 300 mg loading dose tomorrow. I want him to have CBC and basic metabolic profile tomorrow.

## 2018-04-25 NOTE — Telephone Encounter (Signed)
°*  STAT* If patient is at the pharmacy, call can be transferred to refill team.   1. Which medications need to be refilled? (please list name of each medication and dose if known) Plavix and fluid pills   2. Which pharmacy/location (including street and city if local pharmacy) is medication to be sent to? [facility]   3. Do they need a 30 day or 90 day supply? 90 day   Patient had Cath today and needs this called in today

## 2018-04-26 ENCOUNTER — Encounter

## 2018-04-26 ENCOUNTER — Encounter: Payer: Self-pay | Admitting: *Deleted

## 2018-04-26 ENCOUNTER — Ambulatory Visit: Payer: PPO | Admitting: Cardiovascular Disease

## 2018-04-26 ENCOUNTER — Other Ambulatory Visit
Admission: RE | Admit: 2018-04-26 | Discharge: 2018-04-26 | Disposition: A | Discharge status: Home / Self Care | Payer: PPO | Source: Ambulatory Visit | Attending: Cardiovascular Disease | Admitting: Cardiovascular Disease

## 2018-04-26 DIAGNOSIS — I25118 Atherosclerotic heart disease of native coronary artery with other forms of angina pectoris: Secondary | ICD-10-CM | POA: Diagnosis not present

## 2018-04-26 LAB — CBC
HCT: 34.9 % — ABNORMAL LOW (ref 39.0–52.0)
Hemoglobin: 11.1 g/dL — ABNORMAL LOW (ref 13.0–17.0)
MCH: 30.2 pg (ref 26.0–34.0)
MCHC: 31.8 g/dL (ref 30.0–36.0)
MCV: 94.8 fL (ref 80.0–100.0)
Platelets: 174 10*3/uL (ref 150–400)
RBC: 3.68 MIL/uL — ABNORMAL LOW (ref 4.22–5.81)
RDW: 12.8 % (ref 11.5–15.5)
WBC: 3.2 10*3/uL — ABNORMAL LOW (ref 4.0–10.5)
nRBC: 0 % (ref 0.0–0.2)

## 2018-04-26 LAB — BASIC METABOLIC PANEL
Anion gap: 9 (ref 5–15)
BUN: 13 mg/dL (ref 8–23)
CO2: 27 mmol/L (ref 22–32)
Calcium: 9 mg/dL (ref 8.9–10.3)
Chloride: 103 mmol/L (ref 98–111)
Creatinine, Ser: 1.08 mg/dL (ref 0.61–1.24)
GFR calc Af Amer: >60 mL/min (ref >60)
GFR calc non Af Amer: >60 mL/min (ref >60)
Glucose, Bld: 180 mg/dL — ABNORMAL HIGH (ref 70–99)
Potassium: 4.6 mmol/L (ref 3.5–5.1)
Sodium: 139 mmol/L (ref 135–145)

## 2018-04-26 NOTE — Telephone Encounter (Signed)
Patient called and informed that his procedure is scheduled for 12/4 at [facility]. He will arrive by 12:30 for his 2:30 procedure.  He will get his labs drawn today.  Letter of instructions has been sent to [place]. He has verbalized his understanding and will call back if anything further is needed.

## 2018-04-26 NOTE — Addendum Note (Signed)
Addended by: Ricci Barker on: 04/26/2018 10:39 AM   Modules accepted: Orders

## 2018-04-27 ENCOUNTER — Encounter (HOSPITAL_COMMUNITY)
Admission: RE | Disposition: A | Discharge status: Home / Self Care | Payer: Self-pay | Source: Ambulatory Visit | Attending: Interventional Cardiology

## 2018-04-27 ENCOUNTER — Ambulatory Visit (HOSPITAL_COMMUNITY)
Admission: RE | Admit: 2018-04-27 | Discharge: 2018-04-28 | Disposition: A | Discharge status: Home / Self Care | Payer: PPO | Source: Ambulatory Visit | Attending: Interventional Cardiology | Admitting: Interventional Cardiology

## 2018-04-27 ENCOUNTER — Telehealth: Payer: Self-pay | Admitting: Cardiovascular Disease

## 2018-04-27 ENCOUNTER — Other Ambulatory Visit: Payer: Self-pay

## 2018-04-27 ENCOUNTER — Encounter (HOSPITAL_COMMUNITY): Payer: Self-pay | Admitting: General Practice

## 2018-04-27 DIAGNOSIS — I25810 Atherosclerosis of coronary artery bypass graft(s) without angina pectoris: Secondary | ICD-10-CM | POA: Diagnosis present

## 2018-04-27 DIAGNOSIS — I060 Rheumatic aortic stenosis: Secondary | ICD-10-CM | POA: Diagnosis not present

## 2018-04-27 DIAGNOSIS — Z841 Family history of disorders of kidney and ureter: Secondary | ICD-10-CM | POA: Diagnosis not present

## 2018-04-27 DIAGNOSIS — R0609 Other forms of dyspnea: Secondary | ICD-10-CM | POA: Diagnosis not present

## 2018-04-27 DIAGNOSIS — Z955 Presence of coronary angioplasty implant and graft: Secondary | ICD-10-CM | POA: Diagnosis not present

## 2018-04-27 DIAGNOSIS — Z7901 Long term (current) use of anticoagulants: Secondary | ICD-10-CM | POA: Insufficient documentation

## 2018-04-27 DIAGNOSIS — I25119 Atherosclerotic heart disease of native coronary artery with unspecified angina pectoris: Secondary | ICD-10-CM | POA: Insufficient documentation

## 2018-04-27 DIAGNOSIS — Z9981 Dependence on supplemental oxygen: Secondary | ICD-10-CM | POA: Diagnosis not present

## 2018-04-27 DIAGNOSIS — I4819 Other persistent atrial fibrillation: Secondary | ICD-10-CM | POA: Insufficient documentation

## 2018-04-27 DIAGNOSIS — G4730 Sleep apnea, unspecified: Secondary | ICD-10-CM | POA: Diagnosis not present

## 2018-04-27 DIAGNOSIS — I1310 Hypertensive heart and chronic kidney disease without heart failure, with stage 1 through stage 4 chronic kidney disease, or unspecified chronic kidney disease: Secondary | ICD-10-CM | POA: Insufficient documentation

## 2018-04-27 DIAGNOSIS — I6523 Occlusion and stenosis of bilateral carotid arteries: Secondary | ICD-10-CM | POA: Diagnosis not present

## 2018-04-27 DIAGNOSIS — Z23 Encounter for immunization: Secondary | ICD-10-CM | POA: Diagnosis not present

## 2018-04-27 DIAGNOSIS — I2510 Atherosclerotic heart disease of native coronary artery without angina pectoris: Secondary | ICD-10-CM | POA: Diagnosis present

## 2018-04-27 DIAGNOSIS — Z79899 Other long term (current) drug therapy: Secondary | ICD-10-CM | POA: Diagnosis not present

## 2018-04-27 DIAGNOSIS — I252 Old myocardial infarction: Secondary | ICD-10-CM | POA: Diagnosis not present

## 2018-04-27 DIAGNOSIS — Z794 Long term (current) use of insulin: Secondary | ICD-10-CM | POA: Insufficient documentation

## 2018-04-27 DIAGNOSIS — Z8249 Family history of ischemic heart disease and other diseases of the circulatory system: Secondary | ICD-10-CM | POA: Insufficient documentation

## 2018-04-27 DIAGNOSIS — I119 Hypertensive heart disease without heart failure: Secondary | ICD-10-CM | POA: Diagnosis present

## 2018-04-27 DIAGNOSIS — I25118 Atherosclerotic heart disease of native coronary artery with other forms of angina pectoris: Secondary | ICD-10-CM | POA: Diagnosis not present

## 2018-04-27 DIAGNOSIS — I2584 Coronary atherosclerosis due to calcified coronary lesion: Secondary | ICD-10-CM | POA: Diagnosis not present

## 2018-04-27 DIAGNOSIS — N189 Chronic kidney disease, unspecified: Secondary | ICD-10-CM | POA: Diagnosis not present

## 2018-04-27 DIAGNOSIS — I208 Other forms of angina pectoris: Secondary | ICD-10-CM | POA: Diagnosis present

## 2018-04-27 DIAGNOSIS — E1122 Type 2 diabetes mellitus with diabetic chronic kidney disease: Secondary | ICD-10-CM | POA: Insufficient documentation

## 2018-04-27 DIAGNOSIS — E785 Hyperlipidemia, unspecified: Secondary | ICD-10-CM | POA: Diagnosis not present

## 2018-04-27 DIAGNOSIS — G4733 Obstructive sleep apnea (adult) (pediatric): Secondary | ICD-10-CM | POA: Diagnosis present

## 2018-04-27 DIAGNOSIS — E669 Obesity, unspecified: Secondary | ICD-10-CM | POA: Insufficient documentation

## 2018-04-27 DIAGNOSIS — E1139 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: Secondary | ICD-10-CM | POA: Diagnosis present

## 2018-04-27 DIAGNOSIS — E119 Type 2 diabetes mellitus without complications: Secondary | ICD-10-CM

## 2018-04-27 DIAGNOSIS — I4891 Unspecified atrial fibrillation: Secondary | ICD-10-CM | POA: Diagnosis present

## 2018-04-27 HISTORY — DX: Obstructive sleep apnea (adult) (pediatric): G47.33

## 2018-04-27 HISTORY — DX: Dependence on supplemental oxygen: Z99.81

## 2018-04-27 HISTORY — DX: Iron deficiency anemia, unspecified: D50.9

## 2018-04-27 HISTORY — DX: Dependence on other enabling machines and devices: Z99.89

## 2018-04-27 HISTORY — PX: CORONARY ATHERECTOMY: CATH118238

## 2018-04-27 LAB — CBC
HCT: 35.7 % — ABNORMAL LOW (ref 39.0–52.0)
Hemoglobin: 10.8 g/dL — ABNORMAL LOW (ref 13.0–17.0)
MCH: 28.6 pg (ref 26.0–34.0)
MCHC: 30.3 g/dL (ref 30.0–36.0)
MCV: 94.7 fL (ref 80.0–100.0)
Platelets: 192 10*3/uL (ref 150–400)
RBC: 3.77 MIL/uL — ABNORMAL LOW (ref 4.22–5.81)
RDW: 12.9 % (ref 11.5–15.5)
WBC: 3.4 10*3/uL — ABNORMAL LOW (ref 4.0–10.5)
nRBC: 0 % (ref 0.0–0.2)

## 2018-04-27 LAB — GLUCOSE, CAPILLARY
Glucose-Capillary: 186 mg/dL — ABNORMAL HIGH (ref 70–99)
Glucose-Capillary: 112 mg/dL — ABNORMAL HIGH (ref 70–99)

## 2018-04-27 LAB — POCT ACTIVATED CLOTTING TIME
Activated Clotting Time: 323 seconds
Activated Clotting Time: 252 seconds
Activated Clotting Time: 257 seconds
Activated Clotting Time: 323 seconds
Activated Clotting Time: 268 seconds
Activated Clotting Time: 285 seconds
Activated Clotting Time: 169 seconds

## 2018-04-27 LAB — CREATININE, SERUM
Creatinine, Ser: 0.97 mg/dL (ref 0.61–1.24)
GFR calc Af Amer: >60 mL/min (ref >60)
GFR calc non Af Amer: >60 mL/min (ref >60)

## 2018-04-27 LAB — PROTIME-INR
INR: 1.05
Prothrombin Time: 13.6 seconds (ref 11.4–15.2)

## 2018-04-27 SURGERY — CORONARY ATHERECTOMY
Anesthesia: LOCAL

## 2018-04-27 MED ORDER — ASPIRIN 81 MG PO CHEW
CHEWABLE_TABLET | ORAL | Status: AC
  Filled 2018-04-27: qty 3

## 2018-04-27 MED ORDER — ASPIRIN 81 MG PO CHEW
CHEWABLE_TABLET | ORAL | Status: AC
  Administered 2018-04-27: 324 mg via ORAL
  Filled 2018-04-27: qty 1

## 2018-04-27 MED ORDER — SODIUM CHLORIDE 0.9% FLUSH: 3.0000 mL | INTRAVENOUS | Status: DC | PRN

## 2018-04-27 MED ORDER — RAMIPRIL 10 MG PO CAPS
10.0000 mg | ORAL_CAPSULE | Freq: Every day | ORAL | Status: DC
  Administered 2018-04-28: 10 mg via ORAL
  Filled 2018-04-27: qty 1

## 2018-04-27 MED ORDER — INSULIN ASPART 100 UNIT/ML FLEXPEN: 6.0000 [IU] | PEN_INJECTOR | Freq: Two times a day (BID) | SUBCUTANEOUS | Status: DC

## 2018-04-27 MED ORDER — CLOPIDOGREL BISULFATE 75 MG PO TABS: 75.0000 mg | ORAL_TABLET | Freq: Once | ORAL | Status: DC

## 2018-04-27 MED ORDER — MIDAZOLAM HCL 2 MG/2ML IJ SOLN
INTRAMUSCULAR | Status: AC
  Filled 2018-04-27: qty 2

## 2018-04-27 MED ORDER — HEPARIN SODIUM (PORCINE) 5000 UNIT/ML IJ SOLN
5000.0000 [IU] | Freq: Three times a day (TID) | INTRAMUSCULAR | Status: DC
  Administered 2018-04-28: 5000 [IU] via SUBCUTANEOUS
  Filled 2018-04-27: qty 1

## 2018-04-27 MED ORDER — HEPARIN (PORCINE) IN NACL 1000-0.9 UT/500ML-% IV SOLN
INTRAVENOUS | Status: AC
  Filled 2018-04-27: qty 500

## 2018-04-27 MED ORDER — SODIUM CHLORIDE 0.9% FLUSH: 3.0000 mL | Freq: Two times a day (BID) | INTRAVENOUS | Status: DC

## 2018-04-27 MED ORDER — HEPARIN (PORCINE) IN NACL 1000-0.9 UT/500ML-% IV SOLN
INTRAVENOUS | Status: DC | PRN
  Administered 2018-04-27: 500 mL

## 2018-04-27 MED ORDER — WARFARIN - PHARMACIST DOSING INPATIENT: Freq: Every day | Status: DC

## 2018-04-27 MED ORDER — HEPARIN SODIUM (PORCINE) 1000 UNIT/ML IJ SOLN
INTRAMUSCULAR | Status: AC
  Filled 2018-04-27: qty 1

## 2018-04-27 MED ORDER — HYDRALAZINE HCL 20 MG/ML IJ SOLN: 5.0000 mg | INTRAMUSCULAR | Status: AC | PRN

## 2018-04-27 MED ORDER — ONDANSETRON HCL 4 MG/2ML IJ SOLN: 4.0000 mg | Freq: Four times a day (QID) | INTRAMUSCULAR | Status: DC | PRN

## 2018-04-27 MED ORDER — IOHEXOL 350 MG/ML SOLN
INTRAVENOUS | Status: DC | PRN
  Administered 2018-04-27: 205 mL via INTRA_ARTERIAL

## 2018-04-27 MED ORDER — CLOPIDOGREL BISULFATE 300 MG PO TABS
ORAL_TABLET | ORAL | Status: DC | PRN
  Administered 2018-04-27: 225 mg via ORAL

## 2018-04-27 MED ORDER — POLYETHYLENE GLYCOL 400 0.25 % OP SOLN: Freq: Every day | OPHTHALMIC | Status: DC | PRN

## 2018-04-27 MED ORDER — SODIUM CHLORIDE 0.9 % IV SOLN
INTRAVENOUS | Status: DC
  Administered 2018-04-27: 14:00:00 via INTRAVENOUS

## 2018-04-27 MED ORDER — AMLODIPINE BESYLATE 5 MG PO TABS
5.0000 mg | ORAL_TABLET | Freq: Every day | ORAL | Status: DC
  Administered 2018-04-27 – 2018-04-28 (×2): 5 mg via ORAL
  Filled 2018-04-27 (×2): qty 1

## 2018-04-27 MED ORDER — SODIUM CHLORIDE 0.9 % IV SOLN: 250.0000 mL | INTRAVENOUS | Status: DC | PRN

## 2018-04-27 MED ORDER — LABETALOL HCL 5 MG/ML IV SOLN: 10.0000 mg | INTRAVENOUS | Status: AC | PRN

## 2018-04-27 MED ORDER — OMEGA-3-ACID ETHYL ESTERS 1 G PO CAPS
1.0000 g | ORAL_CAPSULE | Freq: Every day | ORAL | Status: DC
  Administered 2018-04-27: 1 g via ORAL
  Filled 2018-04-27: qty 1

## 2018-04-27 MED ORDER — SODIUM CHLORIDE 0.9 % WEIGHT BASED INFUSION
1.0000 mL/kg/h | INTRAVENOUS | Status: AC
  Administered 2018-04-27: 1 mL/kg/h via INTRAVENOUS

## 2018-04-27 MED ORDER — SODIUM CHLORIDE 0.9% FLUSH
3.0000 mL | Freq: Two times a day (BID) | INTRAVENOUS | Status: DC
  Administered 2018-04-28: 07:00:00 3 mL via INTRAVENOUS

## 2018-04-27 MED ORDER — FISH OIL 1000 MG PO CAPS: 1000.0000 mg | ORAL_CAPSULE | Freq: Every evening | ORAL | Status: DC

## 2018-04-27 MED ORDER — ACETAMINOPHEN 325 MG PO TABS: 650.0000 mg | ORAL_TABLET | ORAL | Status: DC | PRN

## 2018-04-27 MED ORDER — VITAMIN C 500 MG PO TABS
500.0000 mg | ORAL_TABLET | Freq: Every evening | ORAL | Status: DC
  Administered 2018-04-27: 500 mg via ORAL
  Filled 2018-04-27: qty 1

## 2018-04-27 MED ORDER — ALPRAZOLAM 0.25 MG PO TABS
0.2500 mg | ORAL_TABLET | Freq: Every day | ORAL | Status: DC | PRN
  Administered 2018-04-27: 0.25 mg via ORAL
  Filled 2018-04-27: qty 1

## 2018-04-27 MED ORDER — LIDOCAINE HCL (PF) 1 % IJ SOLN
INTRAMUSCULAR | Status: DC | PRN
  Administered 2018-04-27: 15 mL
  Administered 2018-04-27: 3 mL

## 2018-04-27 MED ORDER — AMIODARONE HCL 200 MG PO TABS
100.0000 mg | ORAL_TABLET | Freq: Every evening | ORAL | Status: DC
  Administered 2018-04-27: 100 mg via ORAL
  Filled 2018-04-27: qty 1

## 2018-04-27 MED ORDER — MIDAZOLAM HCL 2 MG/2ML IJ SOLN
INTRAMUSCULAR | Status: DC | PRN
  Administered 2018-04-27 (×2): 0.5 mg via INTRAVENOUS
  Administered 2018-04-27: 1 mg via INTRAVENOUS

## 2018-04-27 MED ORDER — CLOPIDOGREL BISULFATE 75 MG PO TABS
75.0000 mg | ORAL_TABLET | Freq: Every day | ORAL | Status: DC
  Administered 2018-04-28: 75 mg via ORAL
  Filled 2018-04-27: qty 1

## 2018-04-27 MED ORDER — GLIMEPIRIDE 4 MG PO TABS
4.0000 mg | ORAL_TABLET | Freq: Every day | ORAL | Status: DC
  Administered 2018-04-28: 10:00:00 4 mg via ORAL
  Filled 2018-04-27: qty 1

## 2018-04-27 MED ORDER — INSULIN ASPART 100 UNIT/ML ~~LOC~~ SOLN
0.0000 [IU] | Freq: Three times a day (TID) | SUBCUTANEOUS | Status: DC
  Administered 2018-04-28: 07:00:00 2 [IU] via SUBCUTANEOUS

## 2018-04-27 MED ORDER — FUROSEMIDE 20 MG PO TABS
20.0000 mg | ORAL_TABLET | Freq: Every day | ORAL | Status: DC
  Administered 2018-04-28: 20 mg via ORAL
  Filled 2018-04-27: qty 1

## 2018-04-27 MED ORDER — CLOPIDOGREL BISULFATE 75 MG PO TABS
ORAL_TABLET | ORAL | Status: AC
  Filled 2018-04-27: qty 3

## 2018-04-27 MED ORDER — HEPARIN SODIUM (PORCINE) 1000 UNIT/ML IJ SOLN
INTRAMUSCULAR | Status: DC | PRN
  Administered 2018-04-27: 10000 [IU] via INTRAVENOUS
  Administered 2018-04-27 (×2): 2500 [IU] via INTRAVENOUS
  Administered 2018-04-27: 1500 [IU] via INTRAVENOUS

## 2018-04-27 MED ORDER — VERAPAMIL HCL 2.5 MG/ML IV SOLN
INTRAVENOUS | Status: AC
  Filled 2018-04-27: qty 2

## 2018-04-27 MED ORDER — WARFARIN SODIUM 5 MG PO TABS
5.0000 mg | ORAL_TABLET | ORAL | Status: AC
  Administered 2018-04-27: 5 mg via ORAL
  Filled 2018-04-27: qty 1

## 2018-04-27 MED ORDER — ASPIRIN 81 MG PO CHEW
81.0000 mg | CHEWABLE_TABLET | Freq: Every day | ORAL | Status: DC
  Administered 2018-04-28: 10:00:00 81 mg via ORAL
  Filled 2018-04-27: qty 1

## 2018-04-27 MED ORDER — FENTANYL CITRATE (PF) 100 MCG/2ML IJ SOLN
INTRAMUSCULAR | Status: AC
  Filled 2018-04-27: qty 2

## 2018-04-27 MED ORDER — ASPIRIN 81 MG PO CHEW
324.0000 mg | CHEWABLE_TABLET | ORAL | Status: AC
  Administered 2018-04-27: 324 mg via ORAL

## 2018-04-27 MED ORDER — SODIUM CHLORIDE 0.9 % IV SOLN
INTRAVENOUS | Status: AC | PRN
  Administered 2018-04-27: 10 mL/h via INTRAVENOUS

## 2018-04-27 MED ORDER — ANGIOPLASTY BOOK
Freq: Once | Status: DC
  Filled 2018-04-27: qty 1

## 2018-04-27 MED ORDER — FENTANYL CITRATE (PF) 100 MCG/2ML IJ SOLN
INTRAMUSCULAR | Status: DC | PRN
  Administered 2018-04-27: 25 ug via INTRAVENOUS
  Administered 2018-04-27: 50 ug via INTRAVENOUS
  Administered 2018-04-27: 25 ug via INTRAVENOUS

## 2018-04-27 MED ORDER — ATORVASTATIN CALCIUM 10 MG PO TABS
20.0000 mg | ORAL_TABLET | Freq: Every evening | ORAL | Status: DC
  Administered 2018-04-27: 20 mg via ORAL
  Filled 2018-04-27: qty 2

## 2018-04-27 MED ORDER — PANTOPRAZOLE SODIUM 40 MG PO TBEC
40.0000 mg | DELAYED_RELEASE_TABLET | Freq: Every day | ORAL | Status: DC
  Administered 2018-04-28: 10:00:00 40 mg via ORAL
  Filled 2018-04-27: qty 1

## 2018-04-27 MED ORDER — LIDOCAINE HCL (PF) 1 % IJ SOLN
INTRAMUSCULAR | Status: AC
  Filled 2018-04-27: qty 30

## 2018-04-27 MED ORDER — NITROGLYCERIN 1 MG/10 ML FOR IR/CATH LAB
INTRA_ARTERIAL | Status: AC
  Filled 2018-04-27: qty 10

## 2018-04-27 MED ORDER — NITROGLYCERIN 0.4 MG SL SUBL: 0.4000 mg | SUBLINGUAL_TABLET | SUBLINGUAL | Status: DC | PRN

## 2018-04-27 MED ORDER — NITROGLYCERIN 1 MG/10 ML FOR IR/CATH LAB
INTRA_ARTERIAL | Status: DC | PRN
  Administered 2018-04-27 (×2): 200 ug via INTRACORONARY

## 2018-04-27 MED ORDER — DIPHENHYDRAMINE HCL 25 MG PO CAPS: 25.0000 mg | ORAL_CAPSULE | Freq: Every evening | ORAL | Status: DC | PRN

## 2018-04-27 SURGICAL SUPPLY — 33 items
BALLN SAPPHIRE 1.0X10 (BALLOONS) ×2
BALLN SAPPHIRE 2.5X12 (BALLOONS) ×2
BALLN SAPPHIRE ~~LOC~~ 3.0X15 (BALLOONS) ×2 IMPLANT
BALLOON SAPPHIRE 1.0X10 (BALLOONS) ×1 IMPLANT
BALLOON SAPPHIRE 2.5X12 (BALLOONS) ×1 IMPLANT
CABLE ADAPT CONN TEMP 6FT (ADAPTER) ×1 IMPLANT
CATH LAUNCHER 6FR AL.75 (CATHETERS) ×1 IMPLANT
CATH S G BIP PACING (SET/KITS/TRAYS/PACK) ×1 IMPLANT
CATH TELEPORT (CATHETERS) ×2 IMPLANT
CATH VISTA GUIDE 6FR JR4 (CATHETERS) ×1 IMPLANT
CATH-GARD ARROW CATH SHIELD (MISCELLANEOUS) ×2
CROWN DIAMONDBACK CLASSIC 1.25 (BURR) ×1 IMPLANT
DEVICE RAD COMP TR BAND LRG (VASCULAR PRODUCTS) ×1 IMPLANT
ELECT DEFIB PAD ADLT CADENCE (PAD) ×2 IMPLANT
GLIDESHEATH SLEND A-KIT 6F 22G (SHEATH) ×2 IMPLANT
GLIDESHEATH SLEND SS 6F .021 (SHEATH) IMPLANT
GUIDELINER 6F (CATHETERS) ×2 IMPLANT
GUIDEWIRE INQWIRE 1.5J.035X260 (WIRE) IMPLANT
INQWIRE 1.5J .035X260CM (WIRE) ×2
KIT ENCORE 26 ADVANTAGE (KITS) ×1 IMPLANT
KIT HEART LEFT (KITS) ×2 IMPLANT
PACK CARDIAC CATHETERIZATION (CUSTOM PROCEDURE TRAY) ×2 IMPLANT
SHEATH PINNACLE 6F 10CM (SHEATH) ×1 IMPLANT
SHEATH PROBE COVER 6X72 (BAG) ×1 IMPLANT
SHIELD CATHGARD ARROW (MISCELLANEOUS) IMPLANT
SHIELD RADPAD SCOOP 12X17 (MISCELLANEOUS) ×2 IMPLANT
STENT SYNERGY DES 2.75X20 (Permanent Stent) ×1 IMPLANT
TRANSDUCER W/STOPCOCK (MISCELLANEOUS) ×2 IMPLANT
TUBING CIL FLEX 10 FLL-RA (TUBING) ×2 IMPLANT
WIRE ASAHI FIELDER XT 300CM (WIRE) ×2 IMPLANT
WIRE ASAHI PROWATER 300CM (WIRE) ×1 IMPLANT
WIRE MINAMO 190 (WIRE) ×1 IMPLANT
WIRE VIPERWIRE COR FLEX .012 (WIRE) ×1 IMPLANT

## 2018-04-27 NOTE — Interval H&P Note (Signed)
Cath Lab Visit (complete for each Cath Lab visit)  Clinical Evaluation Leading to the Procedure:   ACS: No.  Non-ACS:    Anginal Classification: CCS III  Anti-ischemic medical therapy: Maximal Therapy (2 or more classes of medications)  Non-Invasive Test Results: No non-invasive testing performed  Prior CABG: Previous CABG      History and Physical Interval Note:  04/27/2018 3:26 PM  Seth Hardy  has presented today for surgery, with the diagnosis of claudication  The various methods of treatment have been discussed with the patient and family. After consideration of risks, benefits and other options for treatment, the patient has consented to  Procedure(s): CORONARY ATHERECTOMY (N/A) as a surgical intervention .  [name] history has been reviewed, patient examined, no change in status, stable for surgery.  I have reviewed the patient's chart and labs.  Questions were answered to the patient's satisfaction.     Kathlyn Sacramento

## 2018-04-27 NOTE — Interval H&P Note (Signed)
Cath Lab Visit (complete for each Cath Lab visit)  Clinical Evaluation Leading to the Procedure:   ACS: No.  Non-ACS:    Anginal Classification: CCS III  Anti-ischemic medical therapy: Maximal Therapy (2 or more classes of medications)  Non-Invasive Test Results: No non-invasive testing performed  Prior CABG: Previous CABG      History and Physical Interval Note:  04/27/2018 4:42 PM  Seth Hardy  has presented today for surgery, with the diagnosis of claudication  The various methods of treatment have been discussed with the patient and family. After consideration of risks, benefits and other options for treatment, the patient has consented to  Procedure(s): CORONARY ATHERECTOMY (N/A) as a surgical intervention .  [name] history has been reviewed, patient examined, no change in status, stable for surgery.  I have reviewed the patient's chart and labs.  Questions were answered to the patient's satisfaction.     Belva Crome III

## 2018-04-27 NOTE — Telephone Encounter (Signed)
Pt has a question regarding his procedure he is having today (Stent), he states he took his Metformin last night. Please call to discuss.

## 2018-04-27 NOTE — Telephone Encounter (Signed)
Done

## 2018-04-27 NOTE — Telephone Encounter (Signed)
Patient has cath today at [facility]. He wanted to make sure it was ok he took his Metformin last night. Per the new guidelines, patient does not need to hold the day before, just the morning of. States he did not take it this morning. He was very [employer] attorney.

## 2018-04-27 NOTE — CV Procedure (Signed)
   Right radial access using ultrasound guidance.  Coronary angiography performed with JR4 6 French guide catheter.  Initially attempted to use a left Amplatz 0.75 cm for better support but could not seat coaxially.  Temporary transvenous pacemaker placed in the right femoral vein using ultrasound guidance.  Advanced to the right ventricular apex and pacing established with an MA threshold of less than 1 mA.  The device was set at a back-up rate of 45 bpm and pacing was required during the procedure.  Orbital atherectomy assisted by guide catheter extension was performed.  10 atherectomy runs were performed.  Stenting was performed using a 20 x 2.75 mm Synergy requiring a guide catheter extension to position the stent.  Postdilated to 3.0 mm in diameter at 16 atm.  Multiple wires were used in an attempt to cross the stenosis prior to orbital atherectomy, eventually successful using a Fielder XT.  No immediate complications.  Radial hemostasis was achieved with a wrist band.  Manual compression will be used on the femoral vein.  Closing ACT was 275 seconds.  No immediate complications. 

## 2018-04-27 NOTE — Telephone Encounter (Signed)
Patient calling to check on status Transferred to [facility]

## 2018-04-27 NOTE — Progress Notes (Signed)
ANTICOAGULATION CONSULT NOTE - Initial Consult  Pharmacy Consult for warfarin Indication: atrial fibrillation  No Known Allergies  Patient Measurements: Height: 5\' 7"  (170.2 cm) Weight: 190 lb 0.6 oz (86.2 kg) IBW/kg (Calculated) : 66.1  Vital Signs: Temp: 97.7 F (36.5 C) (12/04 1949) Temp Source: Oral (12/04 1949) BP: 145/53 (12/04 1949) Pulse Rate: 49 (12/04 1949)  Labs: Recent Labs    04/26/18 1132 04/27/18 1353 04/27/18 2026  HGB 11.1*  --  10.8*  HCT 34.9*  --  35.7*  PLT 174  --  192  LABPROT  --  13.6  --   INR  --  1.05  --   CREATININE 1.08  --   --     Estimated Creatinine Clearance: 64.8 mL/min (by C-G formula based on SCr of 1.08 mg/dL).   Medical History: Past Medical History:  Diagnosis Date  . Anemia 08/2015   received 2 units rbc one week ago, 3 IV iron infusion -last 1 week.  . Basal cell carcinoma of skin 2012   removed several spots from arms and back  . CAD (coronary artery disease)    a. 01/2010 CABG x 4: LIMA->LAD, VG->D1, VG->OM1, VG->PDA. b. NSTEMI 02/2012 in setting of AF-RVR with cath s/p BMS to RCA (plan for 1 month, possibly up to 3 months of Plavix)  . Carotid disease, bilateral ([place])    a. 03/2011 U/S: 40-59% bilat Carotid dzs;  b. 04/2015 Carotid U/S: 40-59% bilat ICA stenosis; c. 05/2015 CTA Neck: 45% RICA, 40% LICA, mod-marked R vertebral stenosis, mod L vertebral stenosis.  . Chronic kidney disease    small obtusion per pcp. ultrasound done on 08/29/2015  . Diabetes mellitus type II, controlled ([place])    a. Variable CBG 02/2012 - several meds adjusted.  Marland Kitchen Dysrhythmia    intermittent Atrial Fibrillation  . GERD (gastroesophageal reflux disease)   . Heart murmur   . Hyperlipidemia   . Hypertension   . Hypertensive heart disease   . Iron deficiency anemia    "gets infusions q once in awhile" (04/27/2018)  . Macular edema bil   lazer work done previously  . Mild aortic stenosis    a. 03/2011 Echo: EF 55-60%, Mild AS. b. Mild by  cath 02/2012; c. 01/2014 Echo: EF 65-70%, Gr 1 DD, mild AS, mildly dil LA.  . Multiple gastric ulcers 2006  . Myocardial infarction ([place]) 02/2012   stents (x1) at time  . Myocardial infarction [facility])    "2nd one was after 02/2012; don't know date" (04/27/2018)  . Obesity   . On home oxygen therapy    "2.5 w/CPAP" (04/27/2018)  . OSA on CPAP    CPAP settings 3 with oxygen 2.5 (04/27/2018)  . PAF (paroxysmal atrial fibrillation) ([place])    a. Newly dx 02/2012 & initiated on Coumadin (spont converted to NSR).  . Shortness of breath dyspnea   . Transfusion history    transfusions -1 month ago -2 units     Assessment: 64 yoM on warfarin PTA for hx AFib admitted for LHC. Pt is s/p DES placement and is to continue with triple therapy x2 weeks then continue warfarin and Plavix x6 months.  Pharmacy consulted to resume warfarin tonight (last dose PTA 11/26) with three days of 5mg  PO x1 followed by home dosing. Home dose = 2.5mg  Sun/Tues; 5mg  all other days. INR 1.05 today.  Goal of Therapy:  INR 2-3 Monitor platelets by anticoagulation protocol: Yes   Plan:  -Warfarin 5mg  PO x3 days  per MD request then resume prior dosing -Daily protime  Arrie Senate, PharmD, BCPS Clinical Pharmacist [phone] Please check AMION for all [place] numbers 04/27/2018

## 2018-04-28 ENCOUNTER — Encounter (HOSPITAL_COMMUNITY): Payer: Self-pay | Admitting: Interventional Cardiology

## 2018-04-28 DIAGNOSIS — E669 Obesity, unspecified: Secondary | ICD-10-CM | POA: Diagnosis not present

## 2018-04-28 DIAGNOSIS — I25119 Atherosclerotic heart disease of native coronary artery with unspecified angina pectoris: Secondary | ICD-10-CM | POA: Diagnosis not present

## 2018-04-28 DIAGNOSIS — I1310 Hypertensive heart and chronic kidney disease without heart failure, with stage 1 through stage 4 chronic kidney disease, or unspecified chronic kidney disease: Secondary | ICD-10-CM | POA: Diagnosis not present

## 2018-04-28 DIAGNOSIS — I6523 Occlusion and stenosis of bilateral carotid arteries: Secondary | ICD-10-CM | POA: Diagnosis not present

## 2018-04-28 DIAGNOSIS — I208 Other forms of angina pectoris: Secondary | ICD-10-CM | POA: Diagnosis not present

## 2018-04-28 DIAGNOSIS — G4730 Sleep apnea, unspecified: Secondary | ICD-10-CM | POA: Diagnosis not present

## 2018-04-28 DIAGNOSIS — R0609 Other forms of dyspnea: Secondary | ICD-10-CM | POA: Diagnosis not present

## 2018-04-28 DIAGNOSIS — I2584 Coronary atherosclerosis due to calcified coronary lesion: Secondary | ICD-10-CM | POA: Diagnosis not present

## 2018-04-28 DIAGNOSIS — I060 Rheumatic aortic stenosis: Secondary | ICD-10-CM | POA: Diagnosis not present

## 2018-04-28 DIAGNOSIS — E785 Hyperlipidemia, unspecified: Secondary | ICD-10-CM | POA: Diagnosis not present

## 2018-04-28 DIAGNOSIS — Z23 Encounter for immunization: Secondary | ICD-10-CM | POA: Diagnosis not present

## 2018-04-28 DIAGNOSIS — N189 Chronic kidney disease, unspecified: Secondary | ICD-10-CM | POA: Diagnosis not present

## 2018-04-28 DIAGNOSIS — E1122 Type 2 diabetes mellitus with diabetic chronic kidney disease: Secondary | ICD-10-CM | POA: Diagnosis not present

## 2018-04-28 LAB — CBC
HCT: 29.9 % — ABNORMAL LOW (ref 39.0–52.0)
Hemoglobin: 9.4 g/dL — ABNORMAL LOW (ref 13.0–17.0)
MCH: 29.6 pg (ref 26.0–34.0)
MCHC: 31.4 g/dL (ref 30.0–36.0)
MCV: 94 fL (ref 80.0–100.0)
Platelets: 163 10*3/uL (ref 150–400)
RBC: 3.18 MIL/uL — ABNORMAL LOW (ref 4.22–5.81)
RDW: 12.8 % (ref 11.5–15.5)
WBC: 3.8 10*3/uL — ABNORMAL LOW (ref 4.0–10.5)
nRBC: 0 % (ref 0.0–0.2)

## 2018-04-28 LAB — BASIC METABOLIC PANEL
Anion gap: 10 (ref 5–15)
BUN: 12 mg/dL (ref 8–23)
CO2: 24 mmol/L (ref 22–32)
Calcium: 8.5 mg/dL — ABNORMAL LOW (ref 8.9–10.3)
Chloride: 104 mmol/L (ref 98–111)
Creatinine, Ser: 1.02 mg/dL (ref 0.61–1.24)
GFR calc Af Amer: >60 mL/min (ref >60)
GFR calc non Af Amer: >60 mL/min (ref >60)
Glucose, Bld: 201 mg/dL — ABNORMAL HIGH (ref 70–99)
Potassium: 4.2 mmol/L (ref 3.5–5.1)
Sodium: 138 mmol/L (ref 135–145)

## 2018-04-28 LAB — GLUCOSE, CAPILLARY
Glucose-Capillary: 145 mg/dL — ABNORMAL HIGH (ref 70–99)
Glucose-Capillary: 231 mg/dL — ABNORMAL HIGH (ref 70–99)

## 2018-04-28 LAB — PROTIME-INR
INR: 1.07
Prothrombin Time: 13.8 seconds (ref 11.4–15.2)

## 2018-04-28 MED ORDER — INSULIN GLARGINE 100 UNIT/ML ~~LOC~~ SOLN
20.0000 [IU] | Freq: Every day | SUBCUTANEOUS | Status: DC
  Administered 2018-04-28: 20 [IU] via SUBCUTANEOUS
  Filled 2018-04-28: qty 0.2

## 2018-04-28 MED ORDER — INSULIN ASPART 100 UNIT/ML ~~LOC~~ SOLN
5.0000 [IU] | Freq: Once | SUBCUTANEOUS | Status: AC
  Administered 2018-04-28: 5 [IU] via SUBCUTANEOUS

## 2018-04-28 MED ORDER — INFLUENZA VAC SPLIT HIGH-DOSE 0.5 ML IM SUSY: 0.5000 mL | PREFILLED_SYRINGE | INTRAMUSCULAR | Status: DC

## 2018-04-28 MED ORDER — ASPIRIN 81 MG PO CHEW: 81.0000 mg | CHEWABLE_TABLET | Freq: Every day | ORAL | Status: DC

## 2018-04-28 MED ORDER — OMEGA-3-ACID ETHYL ESTERS 1 G PO CAPS: 1.0000 g | ORAL_CAPSULE | Freq: Every day | ORAL | 4 refills | Status: DC

## 2018-04-28 MED ORDER — OMEGA-3-ACID ETHYL ESTERS 1 G PO CAPS
1.0000 g | ORAL_CAPSULE | Freq: Every day | ORAL | Status: DC
  Filled 2018-04-28: qty 1

## 2018-04-28 MED ORDER — INFLUENZA VAC SPLIT HIGH-DOSE 0.5 ML IM SUSY
0.5000 mL | PREFILLED_SYRINGE | Freq: Once | INTRAMUSCULAR | Status: AC
  Administered 2018-04-28: 15:00:00 0.5 mL via INTRAMUSCULAR
  Filled 2018-04-28: qty 0.5

## 2018-04-28 NOTE — Progress Notes (Signed)
CARDIAC REHAB PHASE I   PRE:  Rate/Rhythm: 49 SB    BP: sitting 134/47    SaO2:   MODE:  Ambulation: 450 ft   POST:  Rate/Rhythm: 66 SR    BP: sitting 162/56     SaO2:   Tolerated well, no c/o. VSS. Ed completed with good reception. Understands importance of Plavix/ASA/Coumadin. Will refer to  CRPII.  [id number]   [place], ACSM 04/28/2018 8:50 AM

## 2018-04-28 NOTE — Progress Notes (Signed)
Site area: right groin  Site Prior to Removal:  Level 1(bruising)  Pressure Applied For 25 MINUTES    Minutes Beginning at 2155  Manual:   Yes.    Patient Status During Pull:  WNL  Post Pull Groin Site:  Level 1(BRUISING)  Post Pull Instructions Given:  Yes.    Post Pull Pulses Present:  Yes.    Dressing Applied:  Yes.    Comments: Pt.tolerated procedure well

## 2018-04-28 NOTE — Discharge Summary (Signed)
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  The stent was postdilated to 3 mm in diameter.  The stent platform was a 2.75 x 20 mm Synergy DES.  The procedure was prolonged.  The patient received 45 minutes of fluoroscopy and > 200 cc contrast.  No significant complications occurred.  RECOMMENDATIONS:   Wafarin, aspirin, and Plavix for 2 weeks then drop aspirin. Warfarin and Plavix to complete 6 months then drop Plavix.  Aggressive risk factor modification, guideline directed.  Eligible for discharge tomorrow if no bleeding complications.  Recommendations to resume Coumadin at currently prescribed dose and frequency on 04/27/2018 with recommendation for concurrent antiplatelet therapy with ASA 81 mg for 2 weeks and clopidogrel 75 mg daily for 6 months.   Right and left cardiac catheterization 04/25/2018:  Prox RCA lesion is 30% stenosed.  Dist RCA lesion is 95% stenosed.  SVG.  The graft exhibits mild .  And is small.  LIMA and is normal  in caliber.  The graft exhibits no disease.  Ost Cx to Prox Cx lesion is 70% stenosed.  Prox LAD lesion is 90% stenosed.  Post Atrio lesion is 40% stenosed.  Origin lesion is 100% stenosed.   1.  Significant underlying three-vessel coronary artery disease with patent LIMA to LAD and SVG to OM.  Chronically occluded SVG to RCA.  Patent proximal RCA stent with only mild in-stent restenosis.  New severe calcified disease in the distal right coronary artery which is the likely culprit for anginal symptoms. 2.  Left ventricular angiography was not performed.  EF was normal by echo. 3.  Right heart catheterization showed mildly elevated filling pressures with PCW of 17 mmHg (prominent V wave), LVEDP of 22, PA pressure of 61/7 and cardiac output of 5.6 with an index of 2.8. 4.  Mild to moderate aortic stenosis with a mean gradient of 20 mmHg and valve area of 1.64.  Recommendations: The RCA lesion is heavily calcified and thus atherectomy is likely the best treatment modality.  We will have to make sure the patient will be able to tolerate clopidogrel in addition to anticoagulation given history of anemia. Pulmonary hypertension seems to be out of proportion to his diastolic heart failure although the patient does seem to be mildly volume overloaded and thus I will instruct him to take small dose of furosemide daily instead of as needed. _____________   History of Present Illness    Seth Hardy is a 72 y.o. with a history of coronary artery disease and is status CABG in September 2011 with subsequent bare metal stenting of  the right coronary artery in 2013. He also has a history of persistent atrial fibrillation maintained in sinus rhythm on small dose amiodarone and is chronically anticoagulated on Coumadin. 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   Most recent echocardiogram in July 2018 showed normal ejection fraction with mild aortic stenosis.  Mean gradient was 13 mmHg.   He was last seen in the office of Dr. Fletcher Anon on 04/25/2018 and had complaints of worsening exertional shortness of breath with associated chest tightness that happens after walking about one quarter of a mile.  Per chart review, stress test had not been helpful in the past and plans were made for cardiac catheterization for further evaluation.  Cath revealed significant underlying three-vessel CAD with new severe calcified disease in the distal RCA which is likely the culprit for his anginal symptoms.  He was then brought back on 04/27/2018 for coronary arthrectomy and stent placement.  Hospital Course     He underwent a successful orbital arthrectomy of the distal RCA followed by drug-eluting stent implantation on 04/27/2018. The patient received 45 minutes of fluoroscopy and greater than 200 cc of contrast. Post cath creatinine stable at 1.02 on day of discharge. INR was 1.07 with recommendations to resume home regimen this evening. Post cath recommendations were for Coumadin resumption, ASA and Plavix for 2 weeks then dropped ASA. Coumadin and Plavix should be complete for 6 months then drop Plavix.  Also aggressive risk factor modification, guideline directed.  Medication plan: -Okay to resume Coumadin at home dose of 5 mg on Sunday, Monday, Wednesday, Thursday and Friday.  Take 2.5 mg on Tuesday and Saturday -Can resume Coumadin dose of 5 mg this evening and resume regimen as prescribed -Will need to take ASA 81 and Plavix 75 for 2 weeks then drop ASA 81 -At 6 months can drop Plavix 75  Consultants: None  The patient has been seen and examined by Dr. Margaretann Loveless who feels that he is stable and ready for discharge today, 04/28/2018.  Cath sites unremarkable.  No bleeding complications. The patient has ambulated with cardiac rehabilitation without  complication. _____________  Discharge Vitals Blood pressure (!) 136/52, pulse (!) 46, temperature 97.8 F (36.6 C), temperature source Oral, resp. rate 17, height 5\' 7"  (1.702 m), weight 86.2 kg, SpO2 94 %.  Filed Weights   04/27/18 1251 04/27/18 1949 04/28/18 0539  Weight: 86.2 kg 86.2 kg 86.2 kg   Labs & Radiologic Studies    CBC Recent Labs    04/27/18 2026 04/28/18 0438  WBC 3.4* 3.8*  HGB 10.8* 9.4*  HCT 35.7* 29.9*  MCV 94.7 94.0  PLT 192 240   Basic Metabolic Panel Recent Labs    04/26/18 1132 04/27/18 2026 04/28/18 0438  NA 139  --  138  K 4.6  --  4.2  CL 103  --  104  CO2 27  --  24  GLUCOSE 180*  --  201*  BUN 13  --  12  CREATININE 1.08 0.97 1.02  CALCIUM 9.0  --  8.5*   Disposition   Pt is being discharged home today in good condition.  Follow-up Plans & Appointments    Follow-up Information    Wellington Hampshire, MD Follow up on 06/03/2018.   Specialty:  Cardiology Why:  Your follow-up appointment will be on 06/03/2018 at 2:20 PM Contact information: [address]  [address] [place] [address] [phone]        [facility] Follow up on 05/02/2018.   Specialty:  Cardiology Why:  Your follow-up INR check will be on 05/02/2018 at 1:45 PM Contact information: [address], [place] [phone]         Discharge Instructions    Amb Referral to Cardiac Rehabilitation   Complete by:  As directed    Diagnosis:   Coronary Stents PTCA     Call MD for:  difficulty breathing, headache or visual disturbances   Complete by:  As directed    Call MD for:  hives   Complete by:  As directed    Call MD for:  persistant dizziness or light-headedness   Complete by:  As directed    Call MD for:  persistant nausea and vomiting   Complete by:  As directed    Call MD for:  redness, tenderness, or signs of infection (pain, swelling, redness, odor or green/yellow discharge around  incision site)   Complete by:  As directed    Call MD for:  severe uncontrolled pain   Complete by:  As directed    Call MD for:  temperature >100.4   Complete by:  As directed    Diet - low sodium heart healthy   Complete by:  As directed    Discharge instructions   Complete by:  As directed    Can resume your Metformin on 04/29/2018  Okay to resume Coumadin at home dose of 5 mg on Sunday, Monday, Wednesday, Thursday and Friday. Take 2.5 mg on Tuesday and Saturday  Resume Coumadin dose of 5 mg this evening and resume regimen as prescribed  No driving for 3 days. No lifting over 5 lbs for 1 week. No sexual activity for 1 week. Keep procedure site clean & dry. If you notice increased pain, swelling, bleeding or pus, call/return!  You may shower, but no soaking baths/hot tubs/pools for 1 week.   PLEASE DO NOT MISS ANY DOSES OF YOUR PLAVIX!!!!! Also keep a log of you blood pressures and bring back to your follow up appt. Please call the office with any questions.   Patients taking blood thinners should generally stay away from medicines like ibuprofen, Advil, Motrin, naproxen, and Aleve due to risk of stomach bleeding. You may take Tylenol as directed or talk to your primary doctor about alternatives.   Increase activity slowly   Complete by:  As directed      Discharge Medications   Allergies as of 04/28/2018   No Known Allergies     Medication List    TAKE these medications   acetaminophen 500 MG tablet Commonly known as:  TYLENOL Take 1,000 mg by mouth every 6 (six) hours as needed for moderate pain or headache.   ALPRAZolam 0.25 MG tablet Commonly known as:  XANAX Take 0.25 mg by mouth daily as needed for anxiety.   amiodarone 200 MG tablet Commonly known as:  PACERONE TAKE 1/2 TABLET AT BEDTIME What changed:  when to take this   amLODipine 5 MG tablet Commonly known as:  NORVASC Take one tablet by mouth one time daily   aspirin 81 MG chewable tablet Chew 1 tablet  (81 mg total) by mouth daily. Start taking on:  04/29/2018   atorvastatin 20 MG tablet Commonly known as:  LIPITOR TAKE ONE TABLET AT BEDTIME What changed:  when to take this   B-12 1000 MCG  Caps Take 1,000 mcg by mouth every evening.   BLINK TEARS OP Place 1 drop into both eyes daily as needed (dry eyes).   clopidogrel 75 MG tablet Commonly known as:  PLAVIX Take 1 tablet (75 mg total) by mouth daily.   diphenhydrAMINE 25 MG tablet Commonly known as:  BENADRYL Take 25 mg by mouth at bedtime as needed for sleep.   Fish Oil 1000 MG Caps Take 1,000 mg by mouth every evening.   furosemide 20 MG tablet Commonly known as:  LASIX Take 1 tablet (20 mg total) by mouth daily.   glimepiride 4 MG tablet Commonly known as:  AMARYL Take 4 mg by mouth daily.   insulin glargine 100 UNIT/ML injection Commonly known as:  LANTUS Inject 20 Units into the skin daily.   IRON-C PO Take 1 tablet by mouth daily.   metFORMIN 500 MG tablet Commonly known as:  GLUCOPHAGE Take 500 mg by mouth 2 (two) times daily with a meal.   NITROSTAT 0.4 MG SL tablet Generic drug:  nitroGLYCERIN Place 1 tablet (0.4 mg total) under the tongue every 5 (five) minutes as needed for chest pain.   NOVOLOG FLEXPEN 100 UNIT/ML FlexPen Generic drug:  insulin aspart Inject 6-36 Units into the skin 2 (two) times daily with a meal.   omega-3 acid ethyl esters 1 g capsule Commonly known as:  LOVAZA Take 1 capsule (1 g total) by mouth daily.   OXYGEN Inhale 2.5 L/min into the lungs at bedtime. With CPAP   pantoprazole 40 MG tablet Commonly known as:  PROTONIX Take 1 tablet (40 mg total) by mouth daily.   ramipril 10 MG capsule Commonly known as:  ALTACE Take 10 mg by mouth daily.   vitamin C 500 MG tablet Commonly known as:  ASCORBIC ACID Take 500 mg by mouth every evening.   warfarin 5 MG tablet Commonly known as:  COUMADIN Take as directed. If you are unsure how to take this medication, talk to  your nurse or doctor. Original instructions:  Take 2.5-5 mg by mouth See admin instructions. Take 5 mg at night on SunApr 25, 2024, Wed, Thur, and Fri.  Take 2.5 mg at night on Tues and Sat       Acute coronary syndrome (MI, NSTEMI, STEMI, etc) this admission?: No.    Outstanding Labs/Studies   To have INR checked on 09/17/2022 05/02/18 and follow up appointment with Dr. Fletcher Anon on 06/03/17  Duration of Discharge Encounter   Greater than 30 minutes including physician time.  Signed, Kathyrn Drown, NP 04/28/2018, 12:47 PM

## 2018-04-28 NOTE — Progress Notes (Signed)
TR BAND REMOVAL  LOCATION:    right radial  DEFLATED PER PROTOCOL:    Yes.    TIME BAND OFF / DRESSING APPLIED:    0020   SITE UPON ARRIVAL:    Level 0  SITE AFTER BAND REMOVAL:    Level 1(bruising)  CIRCULATION SENSATION AND MOVEMENT:    Within Normal Limits   Yes.    COMMENTS:   Pt.tolerated procedure well

## 2018-04-28 NOTE — Progress Notes (Signed)
Progress Note  Patient Name: Seth Hardy Date of Encounter: 04/28/2018  Primary Cardiologist: Kathlyn Sacramento, MD   Subjective   Feels well, ready to go home. Access sites c/d/i R rad/R fem  Inpatient Medications    Scheduled Meds: . amiodarone  100 mg Oral QPM  . amLODipine  5 mg Oral Daily  . angioplasty book   Does not apply Once  . aspirin  81 mg Oral Daily  . atorvastatin  20 mg Oral QPM  . clopidogrel  75 mg Oral Q breakfast  . furosemide  20 mg Oral Daily  . glimepiride  4 mg Oral Daily  . heparin  5,000 Units Subcutaneous Q8H  . insulin aspart  0-15 Units Subcutaneous TID WC  . insulin glargine  20 Units Subcutaneous Daily  . omega-3 acid ethyl esters  1 g Oral Daily  . pantoprazole  40 mg Oral Daily  . ramipril  10 mg Oral Daily  . sodium chloride flush  3 mL Intravenous Q12H  . vitamin C  500 mg Oral QPM  . Warfarin - Pharmacist Dosing Inpatient   Does not apply q1800   Continuous Infusions: . sodium chloride     PRN Meds: sodium chloride, acetaminophen, ALPRAZolam, diphenhydrAMINE, nitroGLYCERIN, ondansetron (ZOFRAN) IV, sodium chloride flush   Vital Signs    Vitals:   04/28/18 0310 04/28/18 0340 04/28/18 0539 04/28/18 0714  BP: (!) 138/53 (!) 153/51 (!) 147/49 (!) 136/52  Pulse: (!) 51 (!) 55 (!) 47 (!) 46  Resp: 15 17 18 17   Temp:    97.8 F (36.6 C)  TempSrc:   Oral Oral  SpO2: 97% 94% 97% 94%  Weight:   86.2 kg   Height:        Intake/Output Summary (Last 24 hours) at 04/28/2018 1149 Last data filed at 04/28/2018 0700 Gross per 24 hour  Intake 1462 ml  Output 1300 ml  Net 162 ml   Filed Weights   04/27/18 1251 04/27/18 1949 04/28/18 0539  Weight: 86.2 kg 86.2 kg 86.2 kg    Telemetry    Sinus bradycardia with infrequent ectopy - Personally Reviewed  ECG    Sinus bradycardia Left ventricular hypertrophy with repolarization abnormality- Personally Reviewed  Physical Exam   GEN: No acute distress.   Neck: No JVD Cardiac:  regular rhythm, bradycardic rate, 2/6 SEM , No rubs, or gallops.  Respiratory: Clear to auscultation bilaterally. GI: Soft, nontender, non-distended  MS: No edema; No deformity. Neuro:  Nonfocal  Psych: Normal affect  Vasc: right radial and right femoral access sites c/d/i. Right groin with ecchymosis, no induration. Soft, no bleeding. Labs    Chemistry Recent Labs  Lab 04/26/18 1132 04/27/18 2026 04/28/18 0438  NA 139  --  138  K 4.6  --  4.2  CL 103  --  104  CO2 27  --  24  GLUCOSE 180*  --  201*  BUN 13  --  12  CREATININE 1.08 0.97 1.02  CALCIUM 9.0  --  8.5*  GFRNONAA >60 >60 >60  GFRAA >60 >60 >60  ANIONGAP 9  --  10     Hematology Recent Labs  Lab 04/26/18 1132 04/27/18 2026 04/28/18 0438  WBC 3.2* 3.4* 3.8*  RBC 3.68* 3.77* 3.18*  HGB 11.1* 10.8* 9.4*  HCT 34.9* 35.7* 29.9*  MCV 94.8 94.7 94.0  MCH 30.2 28.6 29.6  MCHC 31.8 30.3 31.4  RDW 12.8 12.9 12.8  PLT 174 192 163  Cardiac EnzymesNo results for input(s): TROPONINI in the last 168 hours. No results for input(s): TROPIPOC in the last 168 hours.   BNPNo results for input(s): BNP, PROBNP in the last 168 hours.   DDimer No results for input(s): DDIMER in the last 168 hours.   Radiology    No results found.  Cardiac Studies    Successful orbital atherectomy of the distal right coronary followed by drug-eluting stent implantation decreasing a 99% heavily calcified stenosis to 0% with TIMI grade III flow.  The stent was postdilated to 3 mm in diameter.  The stent platform was a 2.75 x 20 mm Synergy DES.  The procedure was prolonged.  The patient received 45 minutes of fluoroscopy and > 200 cc contrast.  No significant complications occurred.  RECOMMENDATIONS:   Wafarin, aspirin, and Plavix for 2 weeks then drop aspirin. Warfarin and Plavix to complete 6 months then drop Plavix.  Aggressive risk factor modification, guideline directed.  Eligible for discharge tomorrow if no bleeding  complications.  Patient Profile     Assessment & Plan   Principal Problem:   Effort angina (HCC) Active Problems:   CAD, NATIVE VESSEL   CAD, ARTERY BYPASS GRAFT   CAD (coronary artery disease)   CAD (coronary artery disease), native coronary artery   Ready for discharge home. Reviewed medications, labs and telemetry.   No bleeding complications. Dr. Fletcher Anon to see in 4-6 weeks.  For questions or updates, please contact [place] Please consult [email or web address] for contact info under      Signed, Elouise Munroe, MD  04/28/2018, 11:49 AM

## 2018-05-02 ENCOUNTER — Telehealth: Payer: Self-pay | Admitting: Cardiovascular Disease

## 2018-05-02 ENCOUNTER — Ambulatory Visit (INDEPENDENT_AMBULATORY_CARE_PROVIDER_SITE_OTHER): Payer: PPO

## 2018-05-02 ENCOUNTER — Inpatient Hospital Stay: Payer: PPO | Attending: Internal Medicine

## 2018-05-02 DIAGNOSIS — R0602 Shortness of breath: Secondary | ICD-10-CM | POA: Insufficient documentation

## 2018-05-02 DIAGNOSIS — Z85820 Personal history of malignant melanoma of skin: Secondary | ICD-10-CM | POA: Insufficient documentation

## 2018-05-02 DIAGNOSIS — Z8711 Personal history of peptic ulcer disease: Secondary | ICD-10-CM | POA: Diagnosis not present

## 2018-05-02 DIAGNOSIS — I4891 Unspecified atrial fibrillation: Secondary | ICD-10-CM | POA: Insufficient documentation

## 2018-05-02 DIAGNOSIS — E785 Hyperlipidemia, unspecified: Secondary | ICD-10-CM | POA: Diagnosis not present

## 2018-05-02 DIAGNOSIS — D500 Iron deficiency anemia secondary to blood loss (chronic): Secondary | ICD-10-CM | POA: Diagnosis not present

## 2018-05-02 DIAGNOSIS — I2510 Atherosclerotic heart disease of native coronary artery without angina pectoris: Secondary | ICD-10-CM | POA: Insufficient documentation

## 2018-05-02 DIAGNOSIS — Z79899 Other long term (current) drug therapy: Secondary | ICD-10-CM | POA: Diagnosis not present

## 2018-05-02 DIAGNOSIS — R5383 Other fatigue: Secondary | ICD-10-CM | POA: Diagnosis not present

## 2018-05-02 DIAGNOSIS — Z794 Long term (current) use of insulin: Secondary | ICD-10-CM | POA: Diagnosis not present

## 2018-05-02 DIAGNOSIS — K219 Gastro-esophageal reflux disease without esophagitis: Secondary | ICD-10-CM | POA: Diagnosis not present

## 2018-05-02 DIAGNOSIS — Z800 Family history of malignant neoplasm of digestive organs: Secondary | ICD-10-CM | POA: Insufficient documentation

## 2018-05-02 DIAGNOSIS — E119 Type 2 diabetes mellitus without complications: Secondary | ICD-10-CM | POA: Diagnosis not present

## 2018-05-02 DIAGNOSIS — Z8052 Family history of malignant neoplasm of bladder: Secondary | ICD-10-CM | POA: Insufficient documentation

## 2018-05-02 DIAGNOSIS — Z7982 Long term (current) use of aspirin: Secondary | ICD-10-CM | POA: Insufficient documentation

## 2018-05-02 DIAGNOSIS — Z801 Family history of malignant neoplasm of trachea, bronchus and lung: Secondary | ICD-10-CM | POA: Insufficient documentation

## 2018-05-02 DIAGNOSIS — E669 Obesity, unspecified: Secondary | ICD-10-CM | POA: Diagnosis not present

## 2018-05-02 DIAGNOSIS — Z7901 Long term (current) use of anticoagulants: Secondary | ICD-10-CM | POA: Insufficient documentation

## 2018-05-02 DIAGNOSIS — Z8051 Family history of malignant neoplasm of kidney: Secondary | ICD-10-CM | POA: Insufficient documentation

## 2018-05-02 DIAGNOSIS — N189 Chronic kidney disease, unspecified: Secondary | ICD-10-CM | POA: Diagnosis not present

## 2018-05-02 DIAGNOSIS — R531 Weakness: Secondary | ICD-10-CM | POA: Diagnosis not present

## 2018-05-02 DIAGNOSIS — I1310 Hypertensive heart and chronic kidney disease without heart failure, with stage 1 through stage 4 chronic kidney disease, or unspecified chronic kidney disease: Secondary | ICD-10-CM | POA: Diagnosis not present

## 2018-05-02 DIAGNOSIS — I252 Old myocardial infarction: Secondary | ICD-10-CM | POA: Insufficient documentation

## 2018-05-02 LAB — IRON AND TIBC
Iron: 34 ug/dL — ABNORMAL LOW (ref 45–182)
Saturation Ratios: 8 % — ABNORMAL LOW (ref 17.9–39.5)
TIBC: 425 ug/dL (ref 250–450)
UIBC: 391 ug/dL

## 2018-05-02 LAB — CBC WITH DIFFERENTIAL/PLATELET
Abs Immature Granulocytes: 0.01 10*3/uL (ref 0.00–0.07)
Basophils Absolute: 0 10*3/uL (ref 0.0–0.1)
Basophils Relative: 1 %
Eosinophils Absolute: 0.1 10*3/uL (ref 0.0–0.5)
Eosinophils Relative: 3 %
HCT: 31.7 % — ABNORMAL LOW (ref 39.0–52.0)
Hemoglobin: 9.9 g/dL — ABNORMAL LOW (ref 13.0–17.0)
Immature Granulocytes: 0 %
Lymphocytes Relative: 27 %
Lymphs Abs: 0.9 10*3/uL (ref 0.7–4.0)
MCH: 29.7 pg (ref 26.0–34.0)
MCHC: 31.2 g/dL (ref 30.0–36.0)
MCV: 95.2 fL (ref 80.0–100.0)
Monocytes Absolute: 0.4 10*3/uL (ref 0.1–1.0)
Monocytes Relative: 11 %
Neutro Abs: 1.9 10*3/uL (ref 1.7–7.7)
Neutrophils Relative %: 58 %
Platelets: 185 10*3/uL (ref 150–400)
RBC: 3.33 MIL/uL — ABNORMAL LOW (ref 4.22–5.81)
RDW: 13.1 % (ref 11.5–15.5)
WBC: 3.3 10*3/uL — ABNORMAL LOW (ref 4.0–10.5)
nRBC: 0 % (ref 0.0–0.2)

## 2018-05-02 LAB — BASIC METABOLIC PANEL
Anion gap: 10 (ref 5–15)
BUN: 18 mg/dL (ref 8–23)
CO2: 26 mmol/L (ref 22–32)
Calcium: 8.8 mg/dL — ABNORMAL LOW (ref 8.9–10.3)
Chloride: 103 mmol/L (ref 98–111)
Creatinine, Ser: 1.2 mg/dL (ref 0.61–1.24)
GFR calc Af Amer: >60 mL/min (ref >60)
GFR calc non Af Amer: >60 mL/min (ref >60)
Glucose, Bld: 267 mg/dL — ABNORMAL HIGH (ref 70–99)
Potassium: 4.9 mmol/L (ref 3.5–5.1)
Sodium: 139 mmol/L (ref 135–145)

## 2018-05-02 LAB — POCT INR: INR: 1.7 — AB (ref 2.0–3.0)

## 2018-05-02 LAB — FERRITIN: Ferritin: 9 ng/mL — ABNORMAL LOW (ref 24–336)

## 2018-05-02 NOTE — Telephone Encounter (Signed)
Patient had a vascular procedure last week and wants to know limitations for activity.

## 2018-05-02 NOTE — Telephone Encounter (Signed)
Patient made aware of restrictions and verbalized his understanding.   No driving for 3 days. No lifting over 5 lbs for 1 week. No sexual activity for 1 week. Keep procedure site clean & dry. If you notice increased pain, swelling, bleeding or pus, call/return!  You may shower, but no soaking baths/hot tubs/pools for 1 week.   Appointment made for 12/17 with Dr. Fletcher Anon per the patient's request.

## 2018-05-02 NOTE — Patient Instructions (Addendum)
Please take extra 1/2 tablet tonight and tomorrow, then resume taking 1 tablet daily except 1/2 tablet on Tuesdays and Saturdays.   Recheck INR in 2 weeks.

## 2018-05-03 ENCOUNTER — Other Ambulatory Visit: Payer: Self-pay | Admitting: *Deleted

## 2018-05-03 DIAGNOSIS — D500 Iron deficiency anemia secondary to blood loss (chronic): Secondary | ICD-10-CM

## 2018-05-04 ENCOUNTER — Encounter: Payer: Self-pay | Admitting: Internal Medicine

## 2018-05-04 ENCOUNTER — Inpatient Hospital Stay (HOSPITAL_BASED_OUTPATIENT_CLINIC_OR_DEPARTMENT_OTHER): Payer: PPO | Admitting: Internal Medicine

## 2018-05-04 ENCOUNTER — Inpatient Hospital Stay: Payer: PPO

## 2018-05-04 VITALS — BP 148/72 | HR 50 | Resp 18

## 2018-05-04 VITALS — BP 155/71 | HR 53 | Temp 97.4°F | Resp 16 | Wt 192.0 lb

## 2018-05-04 DIAGNOSIS — I4891 Unspecified atrial fibrillation: Secondary | ICD-10-CM

## 2018-05-04 DIAGNOSIS — R0602 Shortness of breath: Secondary | ICD-10-CM

## 2018-05-04 DIAGNOSIS — Z8051 Family history of malignant neoplasm of kidney: Secondary | ICD-10-CM

## 2018-05-04 DIAGNOSIS — I2510 Atherosclerotic heart disease of native coronary artery without angina pectoris: Secondary | ICD-10-CM | POA: Diagnosis not present

## 2018-05-04 DIAGNOSIS — R5383 Other fatigue: Secondary | ICD-10-CM

## 2018-05-04 DIAGNOSIS — E785 Hyperlipidemia, unspecified: Secondary | ICD-10-CM

## 2018-05-04 DIAGNOSIS — Z79899 Other long term (current) drug therapy: Secondary | ICD-10-CM | POA: Diagnosis not present

## 2018-05-04 DIAGNOSIS — E119 Type 2 diabetes mellitus without complications: Secondary | ICD-10-CM

## 2018-05-04 DIAGNOSIS — Z8711 Personal history of peptic ulcer disease: Secondary | ICD-10-CM

## 2018-05-04 DIAGNOSIS — K219 Gastro-esophageal reflux disease without esophagitis: Secondary | ICD-10-CM | POA: Diagnosis not present

## 2018-05-04 DIAGNOSIS — Z7982 Long term (current) use of aspirin: Secondary | ICD-10-CM

## 2018-05-04 DIAGNOSIS — R531 Weakness: Secondary | ICD-10-CM | POA: Diagnosis not present

## 2018-05-04 DIAGNOSIS — Z7901 Long term (current) use of anticoagulants: Secondary | ICD-10-CM

## 2018-05-04 DIAGNOSIS — N189 Chronic kidney disease, unspecified: Secondary | ICD-10-CM

## 2018-05-04 DIAGNOSIS — I1310 Hypertensive heart and chronic kidney disease without heart failure, with stage 1 through stage 4 chronic kidney disease, or unspecified chronic kidney disease: Secondary | ICD-10-CM

## 2018-05-04 DIAGNOSIS — D500 Iron deficiency anemia secondary to blood loss (chronic): Secondary | ICD-10-CM | POA: Diagnosis not present

## 2018-05-04 DIAGNOSIS — Z85820 Personal history of malignant melanoma of skin: Secondary | ICD-10-CM

## 2018-05-04 DIAGNOSIS — Z801 Family history of malignant neoplasm of trachea, bronchus and lung: Secondary | ICD-10-CM

## 2018-05-04 DIAGNOSIS — I252 Old myocardial infarction: Secondary | ICD-10-CM

## 2018-05-04 DIAGNOSIS — E669 Obesity, unspecified: Secondary | ICD-10-CM

## 2018-05-04 DIAGNOSIS — Z800 Family history of malignant neoplasm of digestive organs: Secondary | ICD-10-CM

## 2018-05-04 DIAGNOSIS — Z8052 Family history of malignant neoplasm of bladder: Secondary | ICD-10-CM

## 2018-05-04 DIAGNOSIS — Z794 Long term (current) use of insulin: Secondary | ICD-10-CM

## 2018-05-04 MED ORDER — SODIUM CHLORIDE 0.9 % IV SOLN
Freq: Once | INTRAVENOUS | Status: AC
  Administered 2018-05-04: 14:00:00 via INTRAVENOUS
  Filled 2018-05-04: qty 250

## 2018-05-04 MED ORDER — SODIUM CHLORIDE 0.9 % IV SOLN
510.0000 mg | Freq: Once | INTRAVENOUS | Status: AC
  Administered 2018-05-04: 510 mg via INTRAVENOUS
  Filled 2018-05-04: qty 17

## 2018-05-04 NOTE — Progress Notes (Signed)
[facility] OFFICE PROGRESS NOTE  Patient Care Team: Rusty Aus, MD as PCP - General (Internal Medicine)   SUMMARY OF ONCOLOGIC HISTORY:  # MARCH 2017- April 2017- IRON DEFICIENCY ANEMIA ? Etiology s/p IV iron [EGD- ?June 2017- Bleeding gastric "polyp"/ colo- Dr.Gessner]; recommend CAPSULE STUDY  # hx of A.fib- on coumadin-HOLD April 2017/ CAD [Dr.[name]]; Hx of gastric ulcer [Dr.Gessner];   TIA/ CEA [s/p April 2017]; OSA on CPAP.   History of present illness:   72 -year-old Caucasian male patient is here for follow-up of his severe iron deficiency anemia-;also on Coumadin for A. fib is here for follow-up. Patient is currently taking iron pills once a day.  The interim patient was recently evaluated by cardiology for coronary atherosclerosis/had cardiac stents placed.  Patient is currently back on aspirin Plavix and Coumadin.  Patient complains of worsening fatigue.  Appetite is fair.  No nausea no vomiting pain no blood in stools no black or stools.  Review of Systems  Constitutional: Positive for malaise/fatigue. Negative for chills, diaphoresis and fever.  HENT: Negative for nosebleeds and sore throat.   Eyes: Negative for double vision.  Respiratory: Positive for shortness of breath. Negative for cough, hemoptysis, sputum production and wheezing.   Cardiovascular: Negative for chest pain, palpitations, orthopnea and leg swelling.  Gastrointestinal: Negative for abdominal pain, blood in stool, constipation, diarrhea, heartburn, melena, nausea and vomiting.  Genitourinary: Negative for dysuria, frequency and urgency.  Musculoskeletal: Negative for back pain and joint pain.  Skin: Negative.  Negative for itching and rash.  Neurological: Negative for dizziness, tingling, focal weakness, weakness and headaches.  Endo/Heme/Allergies: Does not bruise/bleed easily.  Psychiatric/Behavioral: Negative for depression. The patient is not nervous/anxious and does not have  insomnia.      PAST MEDICAL HISTORY :  Past Medical History  Diagnosis Date  . Hypertensive heart disease   . Hyperlipidemia   . Diabetes mellitus type II, controlled ([place])     a. Variable CBG 02/2012 - several meds adjusted.  . Mild aortic stenosis     a. 03/2011 Echo: EF 55-60%, Mild AS. b. Mild by cath 02/2012; c. 01/2014 Echo: EF 65-70%, Gr 1 DD, mild AS, mildly dil LA.  . Obesity   . GERD (gastroesophageal reflux disease)   . PAF (paroxysmal atrial fibrillation) ([place])     a. Newly dx 02/2012 & initiated on Coumadin (spont converted to NSR).  Marland Kitchen CAD (coronary artery disease)     a. 01/2010 CABG x 4: LIMA->LAD, VG->D1, VG->OM1, VG->PDA. b. NSTEMI 02/2012 in setting of AF-RVR with cath s/p BMS to RCA (plan for 1 month, possibly up to 3 months of Plavix)  . Carotid disease, bilateral ([place])     a. 03/2011 U/S: 40-59% bilat Carotid dzs;  b. 04/2015 Carotid U/S: 40-59% bilat ICA stenosis; c. 05/2015 CTA Neck: 22% RICA, 48% LICA, mod-marked R vertebral stenosis, mod L vertebral stenosis.  . Myocardial infarction ([place]) 2014  . Hypertension   . Shortness of breath dyspnea   . Sleep apnea     CPAP with oxygen  . Chronic kidney disease   . Basal cell carcinoma of skin   . Anemia   . Macular edema bil    PAST SURGICAL HISTORY :   Past Surgical History  Procedure Laterality Date  . Cyst l kidney  Coin  . Angioplasty    . Cardiac catheterization    . Left heart catheterization with coronary angiogram N/A 03/09/2012  Procedure: LEFT HEART CATHETERIZATION WITH CORONARY ANGIOGRAM;  Surgeon: Wellington Hampshire, MD;  Location: [facility] CATH LAB;  Service: Cardiovascular;  Laterality: N/A;  . Percutaneous coronary stent intervention (pci-s)  03/09/2012    Procedure: PERCUTANEOUS CORONARY STENT INTERVENTION (PCI-S);  Surgeon: Wellington Hampshire, MD;  Location: [facility] CATH LAB;  Service: Cardiovascular;;  . Coronary artery bypass graft  Aug 17, 2009    [facility], [place], [place],   . Coronary  angioplasty      Dr. Fletcher Anon  . Cataract extraction Bilateral     Cataract Extraction with IOL  . Inguinal hernia repair Right     Right Inguinal Hernia Repair    FAMILY HISTORY :   Family History  Problem Relation Age of Onset  . COPD Mother     alive 7  . Heart attack Father     August 17, 2013 deceased  . Bladder Cancer Mother   . Lung cancer Maternal Aunt   . Liver cancer Maternal Aunt   . Kidney disease Paternal Uncle   . Cancer Other     all paternal aunts and uncles    SOCIAL HISTORY:   Social History  Substance Use Topics  . Smoking status: Never Smoker   . Smokeless tobacco: Never Used     Comment: tobacco use - no  . Alcohol Use: Yes     Comment: rare drink    ALLERGIES:  has No Known Allergies.  MEDICATIONS:  Current Outpatient Prescriptions  Medication Sig Dispense Refill  . acetaminophen (TYLENOL) 325 MG tablet Take 650 mg by mouth every 6 (six) hours as needed. For pain    . amiodarone (PACERONE) 200 MG tablet Take 0.5 tablets (100 mg total) by mouth daily. (Patient taking differently: Take 100 mg by mouth at bedtime. ) 45 tablet 3  . amLODipine (NORVASC) 5 MG tablet Take one tablet by mouth one time daily 90 tablet 3  . aspirin 81 MG EC tablet Take 81 mg by mouth daily.      Marland Kitchen atorvastatin (LIPITOR) 20 MG tablet Take 1 tablet (20 mg total) by mouth at bedtime. 90 tablet 3  . furosemide (LASIX) 20 MG tablet Take 1 tablet (20 mg total) by mouth as needed. 30 tablet 6  . glimepiride (AMARYL) 4 MG tablet Take 4 mg by mouth 2 (two) times daily.      . insulin glargine (LANTUS) 100 UNIT/ML injection Inject 20 Units into the skin daily.     . insulin lispro (HUMALOG) 100 UNIT/ML injection Inject 8 Units into the skin 2 (two) times daily with a meal. (Patient taking differently: Inject into the skin 3 (three) times daily. Per sliding scale.)    . metFORMIN (GLUCOPHAGE) 500 MG tablet Take 500 mg by mouth 2 (two) times daily with a meal.    . nitroGLYCERIN (NITROSTAT) 0.4 MG  SL tablet Place 1 tablet (0.4 mg total) under the tongue every 5 (five) minutes as needed (up to 3 doses). 25 tablet 1  . OXYGEN Inhale 2.5 L/min into the lungs at bedtime. With CPAP    . pantoprazole (PROTONIX) 40 MG tablet Take 1 tablet (40 mg total) by mouth daily. (Patient taking differently: Take 40 mg by mouth 2 (two) times daily. ) 90 tablet 3  . ramipril (ALTACE) 10 MG capsule Take 1 capsule (10 mg total) by mouth daily. 90 capsule 5  . warfarin (COUMADIN) 5 MG tablet Take as directed by anticoagulation clinic 90 tablet 1  . IRON-VITAMIN C PO Take 1 tablet by mouth  daily. Reported on 08/27/2015    . Omega-3 Fatty Acids (FISH OIL) 1200 MG CAPS Take 1 capsule by mouth daily. Reported on 08/27/2015     No current facility-administered medications for this visit.    PHYSICAL EXAMINATION:   BP 158/71 mmHg  Pulse 51  Temp(Src) 96.1 F (35.6 C)  Resp 18  Wt 192 lb 0.3 oz (87.1 kg)  Filed Weights   08/27/15 1015  Weight: 192 lb 0.3 oz (87.1 kg)    Physical Exam  Constitutional: He is oriented to person, place, and time and well-developed, well-nourished, and in no distress.  Accompanied by his wife.  HENT:  Head: Normocephalic and atraumatic.  Mouth/Throat: Oropharynx is clear and moist. No oropharyngeal exudate.  Eyes: Pupils are equal, round, and reactive to light.  Neck: Normal range of motion. Neck supple.  Cardiovascular: Normal rate and regular rhythm.  Pulmonary/Chest: No respiratory distress. He has no wheezes.  Abdominal: Soft. Bowel sounds are normal. He exhibits no distension and no mass. There is no tenderness. There is no rebound and no guarding.  Musculoskeletal: Normal range of motion. He exhibits no edema or tenderness.  Neurological: He is alert and oriented to person, place, and time.  Skin: Skin is warm. There is pallor.  Psychiatric: Affect normal.     LABORATORY DATA:  I have reviewed the data as listed    Component Value Date/Time   NA 139 07/25/2015  1138   NA 139 01/22/2013 0348   K 4.1 07/25/2015 1138   K 4.2 01/22/2013 0348   CL 103 07/25/2015 1138   CL 106 01/22/2013 0348   CO2 27 07/25/2015 1138   CO2 28 01/22/2013 0348   GLUCOSE 216* 07/25/2015 1138   GLUCOSE 158* 01/22/2013 0348   BUN 14 07/25/2015 1138   BUN 16 01/22/2013 0348   CREATININE 1.03 07/25/2015 1138   CREATININE 1.02 01/22/2013 0348   CALCIUM 9.1 07/25/2015 1138   CALCIUM 8.6 01/22/2013 0348   PROT 7.2 06/25/2015 1045   PROT 7.5 01/21/2013 0209   ALBUMIN 4.1 06/25/2015 1045   ALBUMIN 4.3 01/21/2013 0209   AST 30 06/25/2015 1045   AST 28 01/21/2013 0209   ALT 33 06/25/2015 1045   ALT 36 01/21/2013 0209   ALKPHOS 104 06/25/2015 1045   ALKPHOS 143* 01/21/2013 0209   BILITOT 0.4 06/25/2015 1045   BILITOT 0.4 01/21/2013 0209   GFRNONAA >60 07/25/2015 1138   GFRNONAA >60 01/22/2013 0348   GFRAA >60 07/25/2015 1138   GFRAA >60 01/22/2013 0348    No results found for: SPEP, UPEP  Lab Results  Component Value Date   WBC 4.5 08/22/2015   NEUTROABS 2.5 08/22/2015   HGB 8.0 Repeated and verified X2.* 08/22/2015   HCT 26.2 Repeated and verified X2.* 08/22/2015   MCV 67.4 Repeated and verified X2.* 08/22/2015   PLT 264.0 08/22/2015      Chemistry      Component Value Date/Time   NA 139 07/25/2015 1138   NA 139 01/22/2013 0348   K 4.1 07/25/2015 1138   K 4.2 01/22/2013 0348   CL 103 07/25/2015 1138   CL 106 01/22/2013 0348   CO2 27 07/25/2015 1138   CO2 28 01/22/2013 0348   BUN 14 07/25/2015 1138   BUN 16 01/22/2013 0348   CREATININE 1.03 07/25/2015 1138   CREATININE 1.02 01/22/2013 0348      Component Value Date/Time   CALCIUM 9.1 07/25/2015 1138   CALCIUM 8.6 01/22/2013 0348   ALKPHOS  104 06/25/2015 1045   ALKPHOS 143* 01/21/2013 0209   AST 30 06/25/2015 1045   AST 28 01/21/2013 0209   ALT 33 06/25/2015 1045   ALT 36 01/21/2013 0209   BILITOT 0.4 06/25/2015 1045   BILITOT 0.4 01/21/2013 0209        ASSESSMENT & PLAN:   Iron  deficiency anemia due to chronic blood loss #Anemia iron deficient-likely secondary to gastric losses versus others.  Hemoglobin is 9.9.  No active GI bleeding.  However patient never had a capsule study-would recommend the capsule study for further evaluation.  # Leucopenia/ neutropenia-intermittent.  Today- white count 3.3 ANC 1.9.  Monitor for now.  Hold off a bone marrow biopsy.  # CAD s/p stenting- on asprin+ plavix x6 months; A.fib- on Coumadin.   #Hypertension: Blood pressures 099 systolic.  Recommend continued follow-up with cardiology.  #History of OSA-CPAP/Oxygen- STABLE.  # DISPOSITION:  # Ferrahem today; again in 1 week.  # referral to Dr.Mann; GI [family request-re: capsule study] # follow up in 2 months/cbc;possible IV ferrahem- Dr.B

## 2018-05-04 NOTE — Assessment & Plan Note (Addendum)
#  Anemia iron deficient-likely secondary to gastric losses versus others.  Hemoglobin is 9.9.  No active GI bleeding.  However patient never had a capsule study-would recommend the capsule study for further evaluation.  # Leucopenia/ neutropenia-intermittent.  Today- white count 3.3 ANC 1.9.  Monitor for now.  Hold off a bone marrow biopsy.  # CAD s/p stenting- on asprin+ plavix x6 months; A.fib- on Coumadin.   #Hypertension: Blood pressures 462 systolic.  Recommend continued follow-up with cardiology.  #History of OSA-CPAP/Oxygen- STABLE.  # DISPOSITION:  # Ferrahem today; again in 1 week.  # referral to Dr.Mann; GI [family request-re: capsule study] # follow up in 2 months/cbc;possible IV ferrahem- Dr.B

## 2018-05-06 ENCOUNTER — Ambulatory Visit: Payer: PPO | Admitting: Nurse Practitioner

## 2018-05-06 ENCOUNTER — Other Ambulatory Visit: Payer: Self-pay | Admitting: Internal Medicine

## 2018-05-06 DIAGNOSIS — D500 Iron deficiency anemia secondary to blood loss (chronic): Secondary | ICD-10-CM

## 2018-05-10 ENCOUNTER — Ambulatory Visit (INDEPENDENT_AMBULATORY_CARE_PROVIDER_SITE_OTHER): Payer: PPO | Admitting: Cardiovascular Disease

## 2018-05-10 ENCOUNTER — Encounter: Payer: Self-pay | Admitting: Cardiovascular Disease

## 2018-05-10 VITALS — BP 140/58 | HR 54 | Ht 67.5 in | Wt 191.0 lb

## 2018-05-10 DIAGNOSIS — I4819 Other persistent atrial fibrillation: Secondary | ICD-10-CM | POA: Diagnosis not present

## 2018-05-10 DIAGNOSIS — I25118 Atherosclerotic heart disease of native coronary artery with other forms of angina pectoris: Secondary | ICD-10-CM | POA: Diagnosis not present

## 2018-05-10 DIAGNOSIS — I359 Nonrheumatic aortic valve disorder, unspecified: Secondary | ICD-10-CM

## 2018-05-10 DIAGNOSIS — E785 Hyperlipidemia, unspecified: Secondary | ICD-10-CM

## 2018-05-10 MED ORDER — ROSUVASTATIN CALCIUM 40 MG PO TABS: 40.0000 mg | ORAL_TABLET | Freq: Every day | ORAL | 3 refills | Status: DC

## 2018-05-10 NOTE — Patient Instructions (Addendum)
Medication Instructions:  - Your physician has recommended you make the following change in your medication:   1) STOP aspirin 2) STOP lipitor (atorvastatin) 3) START crestor (rousouvastatin) 40 mg- take 1 tablet by mouth once daily  If you need a refill on your cardiac medications before your next appointment, please call your pharmacy.   Lab work: - Your physician recommends that you return for a FASTING lipid profile/ Liver profile- in 6 weeks  If you have labs (blood work) drawn today and your tests are completely normal, you will receive your results only by: Marland Kitchen MyChart Message (if you have MyChart) OR . A paper copy in the mail If you have any lab test that is abnormal or we need to change your treatment, we will call you to review the results.  Testing/Procedures: - none ordered  Follow-Up: At [facility], you and your health needs are our priority.  As part of our continuing mission to provide you with exceptional heart care, we have created designated Provider Care Teams.  These Care Teams include your primary Cardiologist (physician) and Advanced Practice Providers (APPs -  Physician Assistants and Nurse Practitioners) who all work together to provide you with the care you need, when you need it. . 2 months with Dr. Fletcher Anon  Any Other Special Instructions Will Be Listed Below (If Applicable).  - We are placing a referral to Cardiac Rehab. They will contact you directly to schedule an orientation and start your sessions. If you do not hear anything from them in the next 2 weeks, please call the office back at [phone] so we can follow up on this for you.

## 2018-05-10 NOTE — Progress Notes (Signed)
Cardiology Office Note   Date:  05/10/2018   ID:  Seth Hardy, DOB May 18, 1946, MRN [id number]  PCP:  Rusty Aus, MD  Cardiologist:   Kathlyn Sacramento, MD   Chief Complaint  Patient presents with  . OTHER    Post cardiac stent no complaints today. Meds reviewed verbally with pt.      History of Present Illness: Seth Hardy is a 72 y.o. male who Is here today for a follow-up visit . He has a history of coronary artery disease and is status CABG in September 2011 with subsequent bare metal stenting of the right coronary artery in 2013. He also has a history of persistent atrial fibrillation maintained in sinus rhythm on small dose amiodarone and is chronically anticoagulated on Coumadin. He has other chronic medical conditions that include aortic stenosis, hypertension, hyperlipidemia, bilateral carotid arterial disease status post left carotid endarterectomy in April 2017, and diabetes.   Most recent echocardiogram in July 2018 showed normal ejection fraction with mild aortic stenosis.  Mean gradient was 13 mmHg. He has known history of iron deficiency anemia due to gastric polyp which was resected.  He was seen recently for worsening angina.  I repeated his echocardiogram which showed normal LV systolic function.  Aortic stenosis was found to be moderate with a mean gradient of 27 mmHg. A right and left cardiac catheterization was performed which showed significant underlying three-vessel coronary artery disease with patent LIMA to LAD and SVG to OM with known chronically occluded SVG to RCA.  Proximal RCA stent was patent with only mild in-stent restenosis.  However, he was found to have severe calcified stenosis of the distal RCA.  Right heart catheterization showed mildly elevated filling pressures with normal cardiac output.  Aortic stenosis was moderate with a mean gradient of 20 mmHg. The patient underwent stage successful orbital atherectomy of the distal right  coronary artery with drug-eluting stent placement.  He has been doing well since then with no recurrent angina.  Iron deficiency anemia has worsened and he has received IV iron.  He continues to be on triple therapy.  He has not started cardiac rehab.   Past Medical History:  Diagnosis Date  . Anemia 08/2015   received 2 units rbc one week ago, 3 IV iron infusion -last 1 week.  . Basal cell carcinoma of skin 2012   removed several spots from arms and back  . CAD (coronary artery disease)    a. 01/2010 CABG x 4: LIMA->LAD, VG->D1, VG->OM1, VG->PDA. b. NSTEMI 02/2012 in setting of AF-RVR with cath s/p BMS to RCA (plan for 1 month, possibly up to 3 months of Plavix)  . Carotid disease, bilateral ([place])    a. 03/2011 U/S: 40-59% bilat Carotid dzs;  b. 04/2015 Carotid U/S: 40-59% bilat ICA stenosis; c. 05/2015 CTA Neck: 72% RICA, 09% LICA, mod-marked R vertebral stenosis, mod L vertebral stenosis.  . Chronic kidney disease    small obtusion per pcp. ultrasound done on 08/29/2015  . Diabetes mellitus type II, controlled ([place])    a. Variable CBG 02/2012 - several meds adjusted.  Marland Kitchen Dysrhythmia    intermittent Atrial Fibrillation  . GERD (gastroesophageal reflux disease)   . Heart murmur   . Hyperlipidemia   . Hypertension   . Hypertensive heart disease   . Iron deficiency anemia    "gets infusions q once in awhile" (04/27/2018)  . Macular edema bil   lazer work done previously  . Mild aortic  stenosis    a. 03/2011 Echo: EF 55-60%, Mild AS. b. Mild by cath 02/2012; c. 01/2014 Echo: EF 65-70%, Gr 1 DD, mild AS, mildly dil LA.  . Multiple gastric ulcers 2006  . Myocardial infarction ([place]) 02/2012   stents (x1) at time  . Myocardial infarction [facility])    "2nd one was after 02/2012; don't know date" (04/27/2018)  . Obesity   . On home oxygen therapy    "2.5 w/CPAP" (04/27/2018)  . OSA on CPAP    CPAP settings 3 with oxygen 2.5 (04/27/2018)  . PAF (paroxysmal atrial fibrillation) ([place])    a. Newly dx  02/2012 & initiated on Coumadin (spont converted to NSR).  . Shortness of breath dyspnea   . Transfusion history    transfusions -1 month ago -2 units    Past Surgical History:  Procedure Laterality Date  . ANGIOPLASTY    . BASAL CELL CARCINOMA EXCISION     "shoulder, arm X 2"  . CARDIAC CATHETERIZATION  2014  . CATARACT EXTRACTION W/ INTRAOCULAR LENS  IMPLANT, BILATERAL Bilateral 2016  . COLONOSCOPY WITH PROPOFOL N/A 10/15/2015   Procedure: COLONOSCOPY WITH PROPOFOL;  Surgeon: Gatha Mayer, MD;  Location: [facility] END[facility];  Service: Endoscopy;  Laterality: N/A;  . CORONARY ANGIOPLASTY     Dr. Fletcher Anon  . CORONARY ARTERY BYPASS GRAFT  2011   CABG X3-4; [place], [place], [place],   . CORONARY ATHERECTOMY  04/27/2018  . CORONARY ATHERECTOMY N/A 04/27/2018   Procedure: CORONARY ATHERECTOMY;  Surgeon: Belva Crome, MD;  Location: [place] CV LAB;  Service: Cardiovascular;  Laterality: N/A;  . cyst L kidney  [place]  . ENDARTERECTOMY Left 08/30/2015   Procedure: ENDARTERECTOMY CAROTID;  Surgeon: Katha Cabal, MD;  Location: [facility] ORS;  Service: Vascular;  Laterality: Left;  . ESOPHAGOGASTRODUODENOSCOPY (EGD) WITH PROPOFOL N/A 10/15/2015   Procedure: ESOPHAGOGASTRODUODENOSCOPY (EGD) WITH PROPOFOL;  Surgeon: Gatha Mayer, MD;  Location: [facility];  Service: Endoscopy;  Laterality: N/A;  . EYE SURGERY Bilateral    "has macular edema cauterized" (04/27/2018)  . INGUINAL HERNIA REPAIR Right   . LEFT HEART CATHETERIZATION WITH CORONARY ANGIOGRAM N/A 03/09/2012   Procedure: LEFT HEART CATHETERIZATION WITH CORONARY ANGIOGRAM;  Surgeon: Wellington Hampshire, MD;  Location: [place] CATH LAB;  Service: Cardiovascular;  Laterality: N/A;  . PERCUTANEOUS CORONARY STENT INTERVENTION (PCI-S)  03/09/2012   Procedure: PERCUTANEOUS CORONARY STENT INTERVENTION (PCI-S);  Surgeon: Wellington Hampshire, MD;  Location[facility] CATH LAB;  Service: Cardiovascular;;  . RIGHT/LEFT HEART CATH AND CORONARY ANGIOGRAPHY  N/A 04/25/2018   Procedure: RIGHT/LEFT HEART CATH AND CORONARY ANGIOGRAPHY;  Surgeon: Wellington Hampshire, MD;  Location: [place] CV LAB;  Service: Cardiovascular;  Laterality: N/A;     Current Outpatient Medications  Medication Sig Dispense Refill  . acetaminophen (TYLENOL) 500 MG tablet Take 1,000 mg by mouth every 6 (six) hours as needed for moderate pain or headache.    . ALPRAZolam (XANAX) 0.25 MG tablet Take 0.25 mg by mouth daily as needed for anxiety.    Marland Kitchen amiodarone (PACERONE) 200 MG tablet TAKE 1/2 TABLET AT BEDTIME (Patient taking differently: Take 100 mg by mouth every evening. ) 45 tablet 0  . amLODipine (NORVASC) 5 MG tablet Take one tablet by mouth one time daily 90 tablet 3  . aspirin 81 MG chewable tablet Chew 1 tablet (81 mg total) by mouth daily.    Marland Kitchen atorvastatin (LIPITOR) 20 MG tablet TAKE ONE TABLET AT BEDTIME (Patient  taking differently: Take 20 mg by mouth every evening. ) 30 tablet 0  . clopidogrel (PLAVIX) 75 MG tablet Take 1 tablet (75 mg total) by mouth daily. 30 tablet 5  . Cyanocobalamin (B-12) 1000 MCG CAPS Take 1,000 mcg by mouth every evening.     . diphenhydrAMINE (BENADRYL) 25 MG tablet Take 25 mg by mouth at bedtime as needed for sleep.    . Ferrous Gluconate-C-Folic Acid (IRON-C PO) Take 1 tablet by mouth daily.    . furosemide (LASIX) 20 MG tablet Take 1 tablet (20 mg total) by mouth daily. 30 tablet 6  . glimepiride (AMARYL) 4 MG tablet Take 4 mg by mouth daily.     . insulin glargine (LANTUS) 100 UNIT/ML injection Inject 20 Units into the skin daily.     . metFORMIN (GLUCOPHAGE) 500 MG tablet Take 500 mg by mouth 2 (two) times daily with a meal.     . NITROSTAT 0.4 MG SL tablet Place 1 tablet (0.4 mg total) under the tongue every 5 (five) minutes as needed for chest pain. 25 tablet 1  . NOVOLOG FLEXPEN 100 UNIT/ML FlexPen Inject 6-36 Units into the skin 2 (two) times daily with a meal.     . omega-3 acid ethyl esters (LOVAZA) 1 g capsule Take 1  capsule (1 g total) by mouth daily. 90 capsule 4  . Omega-3 Fatty Acids (FISH OIL) 1000 MG CAPS Take 1,000 mg by mouth every evening.    . OXYGEN Inhale 2.5 L/min into the lungs at bedtime. With CPAP    . pantoprazole (PROTONIX) 40 MG tablet Take 1 tablet (40 mg total) by mouth daily. 90 tablet 3  . Polyethylene Glycol 400 (BLINK TEARS OP) Place 1 drop into both eyes daily as needed (dry eyes).    . ramipril (ALTACE) 10 MG capsule Take 10 mg by mouth daily.     . vitamin C (ASCORBIC ACID) 500 MG tablet Take 500 mg by mouth every evening.    . warfarin (COUMADIN) 5 MG tablet Take 2.5-5 mg by mouth See admin instructions. Take 5 mg at night on Sun, Mon, Wed, Thur, and Fri.  Take 2.5 mg at night on Tues and Sat     No current facility-administered medications for this visit.     Allergies:   Patient has no known allergies.    Social History:  The patient  reports that he has never smoked. He has never used smokeless tobacco. He reports current alcohol use. He reports that he does not use drugs.   Family History:  [name] family history includes Bladder Cancer in his mother; COPD in his mother; Cancer in an other family member; Heart attack in his father; Kidney disease in his paternal uncle; Liver cancer in his maternal aunt; Lung cancer in his maternal aunt.    ROS:  Please see the history of present illness.   Otherwise, review of systems are positive for none.   All other systems are reviewed and negative.    PHYSICAL EXAM: VS:  BP (!) 140/58 (BP Location: Left Arm, Patient Position: Sitting, Cuff Size: Normal)   Pulse (!) 54   Ht 5' 7.5" (1.715 m)   Wt 191 lb (86.6 kg)   BMI 29.47 kg/m  , BMI Body mass index is 29.47 kg/m. GEN: Well nourished, well developed, in no acute distress  HEENT: normal  Neck: no JVD, carotid bruits, or masses Cardiac: RRR; no rubs, or gallops, mild bilateral leg edema . There is  a 3/6 crescendo decrescendo systolic murmur in the aortic area which is  mid peaking.  S2 is diminished.  The murmur radiates to the carotid arteries. Respiratory:  clear to auscultation bilaterally, normal work of breathing GI: soft, nontender, nondistended, + BS MS: no deformity or atrophy  Skin: warm and dry, no rash Neuro:  Strength and sensation are intact Psych: euthymic mood, full affect Right radial pulses normal with no hematoma. Right groin is intact with no hematoma but there is superficial bruising.  EKG:  EKG  ordered today. EKG showed sinus bradycardia with anterolateral T wave changes which are chronic.   Recent Labs: 02/02/2018: ALT 29 05/02/2018: BUN 18; Creatinine, Ser 1.20; Hemoglobin 9.9; Platelets 185; Potassium 4.9; Sodium 139    Lipid Panel    Component Value Date/Time   CHOL 99 03/09/2012 0243   TRIG 84 03/09/2012 0243   HDL 37 (L) 03/09/2012 0243   CHOLHDL 2.7 03/09/2012 0243   VLDL 17 03/09/2012 0243   LDLCALC 45 03/09/2012 0243      Wt Readings from Last 3 Encounters:  05/10/18 191 lb (86.6 kg)  05/04/18 192 lb (87.1 kg)  04/28/18 190 lb 0.6 oz (86.2 kg)        ASSESSMENT AND PLAN:  1.  Coronary artery disease involving bypass graft with stable angina: Status post recent successful atherectomy and drug-eluting stent placement to the distal right coronary artery.  He is currently on aspirin, Plavix and warfarin and had worsening anemia.  I elected to discontinue aspirin today. His symptoms have improved significantly.  I am referring him to cardiac rehab.  2. Bilateral carotid artery stenosis: Status post  left carotid endarterectomy. Followed by Dr. Delana Meyer.  3. moderate aortic stenosis: Recommend repeat echocardiogram in December 2020.  4. Persistent atrial fibrillation: Maintaining in sinus rhythm with small dose amiodarone.    Continue anticoagulation with warfarin.  Recent labs showed normal thyroid and liver function.  5. Hyperlipidemia: Most recent LDL was 68.  Given his aggressive atherosclerosis, I  recommend a target LDL of less than 55 based on the [place] guidelines.  I elected to switch him from atorvastatin to rosuvastatin 40 mg daily.  Repeat lipid and liver profile in 6 weeks.     Disposition:   Refer to cardiac rehab.  Follow-up with me in 2 months.   Signed,  Kathlyn Sacramento, MD  05/10/2018 4:38 PM    [place]

## 2018-05-11 ENCOUNTER — Other Ambulatory Visit: Payer: Self-pay | Admitting: Internal Medicine

## 2018-05-11 ENCOUNTER — Inpatient Hospital Stay: Payer: PPO

## 2018-05-11 ENCOUNTER — Other Ambulatory Visit: Payer: Self-pay | Admitting: *Deleted

## 2018-05-11 VITALS — BP 143/68 | HR 49 | Temp 96.2°F | Resp 20

## 2018-05-11 DIAGNOSIS — D500 Iron deficiency anemia secondary to blood loss (chronic): Secondary | ICD-10-CM | POA: Diagnosis not present

## 2018-05-11 DIAGNOSIS — Z8679 Personal history of other diseases of the circulatory system: Secondary | ICD-10-CM

## 2018-05-11 MED ORDER — SODIUM CHLORIDE 0.9 % IV SOLN
510.0000 mg | Freq: Once | INTRAVENOUS | Status: AC
  Administered 2018-05-11: 510 mg via INTRAVENOUS
  Filled 2018-05-11: qty 17

## 2018-05-11 MED ORDER — SODIUM CHLORIDE 0.9 % IV SOLN
Freq: Once | INTRAVENOUS | Status: AC
  Administered 2018-05-11: 15:00:00 via INTRAVENOUS
  Filled 2018-05-11: qty 250

## 2018-05-16 ENCOUNTER — Ambulatory Visit (INDEPENDENT_AMBULATORY_CARE_PROVIDER_SITE_OTHER): Payer: PPO

## 2018-05-16 DIAGNOSIS — Z7901 Long term (current) use of anticoagulants: Secondary | ICD-10-CM | POA: Diagnosis not present

## 2018-05-16 DIAGNOSIS — I4891 Unspecified atrial fibrillation: Secondary | ICD-10-CM

## 2018-05-16 LAB — POCT INR: INR: 2.6 (ref 2.0–3.0)

## 2018-05-16 NOTE — Patient Instructions (Signed)
Please continue dosage 1 tablet daily except 1/2 tablet on Tuesdays and Saturdays.   Recheck INR in 3 weeks.

## 2018-05-30 ENCOUNTER — Encounter: Payer: Self-pay | Admitting: *Deleted

## 2018-05-30 ENCOUNTER — Encounter: Payer: PPO | Attending: Cardiovascular Disease | Admitting: *Deleted

## 2018-05-30 VITALS — Ht 67.0 in | Wt 190.9 lb

## 2018-05-30 DIAGNOSIS — Z955 Presence of coronary angioplasty implant and graft: Secondary | ICD-10-CM | POA: Diagnosis not present

## 2018-05-30 DIAGNOSIS — E113513 Type 2 diabetes mellitus with proliferative diabetic retinopathy with macular edema, bilateral: Secondary | ICD-10-CM | POA: Diagnosis not present

## 2018-05-30 NOTE — Progress Notes (Signed)
Cardiac Individual Treatment Plan  Patient Details  Name: Seth Hardy MRN: [id number] Date of Birth: 1945/07/06 Referring Provider:     Cardiac Rehab from 05/30/2018 in [facility] Cardiac and Pulmonary Rehab  Referring Provider  Kathlyn Sacramento MD      Initial Encounter Date:    Cardiac Rehab from 05/30/2018 in [facility] Cardiac and Pulmonary Rehab  Date  05/30/18      Visit Diagnosis: Status post coronary artery stent placement  Patient's Home Medications on Admission:  Current Outpatient Medications:  .  acetaminophen (TYLENOL) 500 MG tablet, Take 1,000 mg by mouth every 6 (six) hours as needed for moderate pain or headache., Disp: , Rfl:  .  ALPRAZolam (XANAX) 0.25 MG tablet, Take 0.25 mg by mouth daily as needed for anxiety., Disp: , Rfl:  .  amiodarone (PACERONE) 200 MG tablet, TAKE 1/2 TABLET AT BEDTIME (Patient taking differently: Take 100 mg by mouth every evening. ), Disp: 45 tablet, Rfl: 0 .  amLODipine (NORVASC) 5 MG tablet, Take one tablet by mouth one time daily, Disp: 90 tablet, Rfl: 3 .  clopidogrel (PLAVIX) 75 MG tablet, Take 1 tablet (75 mg total) by mouth daily., Disp: 30 tablet, Rfl: 5 .  Cyanocobalamin (B-12) 1000 MCG CAPS, Take 1,000 mcg by mouth every evening. , Disp: , Rfl:  .  diphenhydrAMINE (BENADRYL) 25 MG tablet, Take 25 mg by mouth at bedtime as needed for sleep., Disp: , Rfl:  .  Ferrous Gluconate-C-Folic Acid (IRON-C PO), Take 1 tablet by mouth daily., Disp: , Rfl:  .  furosemide (LASIX) 20 MG tablet, Take 1 tablet (20 mg total) by mouth daily. (Patient taking differently: Take 20 mg by mouth daily. Patient taking prn), Disp: 30 tablet, Rfl: 6 .  glimepiride (AMARYL) 4 MG tablet, Take 4 mg by mouth daily. , Disp: , Rfl:  .  insulin glargine (LANTUS) 100 UNIT/ML injection, Inject 20 Units into the skin daily. , Disp: , Rfl:  .  metFORMIN (GLUCOPHAGE) 500 MG tablet, Take 500 mg by mouth 2 (two) times daily with a meal. , Disp: , Rfl:  .  NITROSTAT 0.4 MG SL tablet,  Place 1 tablet (0.4 mg total) under the tongue every 5 (five) minutes as needed for chest pain., Disp: 25 tablet, Rfl: 1 .  NOVOLOG FLEXPEN 100 UNIT/ML FlexPen, Inject 6-36 Units into the skin 2 (two) times daily with a meal. , Disp: , Rfl:  .  omega-3 acid ethyl esters (LOVAZA) 1 g capsule, Take 1 capsule (1 g total) by mouth daily., Disp: 90 capsule, Rfl: 4 .  pantoprazole (PROTONIX) 40 MG tablet, Take 1 tablet (40 mg total) by mouth daily., Disp: 90 tablet, Rfl: 3 .  Polyethylene Glycol 400 (BLINK TEARS OP), Place 1 drop into both eyes daily as needed (dry eyes)., Disp: , Rfl:  .  ramipril (ALTACE) 10 MG capsule, Take 10 mg by mouth daily. , Disp: , Rfl:  .  rosuvastatin (CRESTOR) 40 MG tablet, Take 1 tablet (40 mg total) by mouth daily., Disp: 90 tablet, Rfl: 3 .  vitamin C (ASCORBIC ACID) 500 MG tablet, Take 500 mg by mouth every evening., Disp: , Rfl:  .  warfarin (COUMADIN) 5 MG tablet, Take 2.5-5 mg by mouth See admin instructions. Take 5 mg at night on Sun, Mon, Wed, Thur, and Fri.  Take 2.5 mg at night on Tues and Sat, Disp: , Rfl:  .  Omega-3 Fatty Acids (FISH OIL) 1000 MG CAPS, Take 1,000 mg by mouth every  evening., Disp: , Rfl:  .  OXYGEN, Inhale 2.5 L/min into the lungs at bedtime. With CPAP, Disp: , Rfl:   Past Medical History: Past Medical History:  Diagnosis Date  . Anemia 08/2015   received 2 units rbc one week ago, 3 IV iron infusion -last 1 week.  . Basal cell carcinoma of skin 2012   removed several spots from arms and back  . CAD (coronary artery disease)    a. 01/2010 CABG x 4: LIMA->LAD, VG->D1, VG->OM1, VG->PDA. b. NSTEMI 02/2012 in setting of AF-RVR with cath s/p BMS to RCA (plan for 1 month, possibly up to 3 months of Plavix)  . Carotid disease, bilateral ([place])    a. 03/2011 U/S: 40-59% bilat Carotid dzs;  b. 04/2015 Carotid U/S: 40-59% bilat ICA stenosis; c. 05/2015 CTA Neck: 91% RICA, 47% LICA, mod-marked R vertebral stenosis, mod L vertebral stenosis.  . Chronic  kidney disease    small obtusion per pcp. ultrasound done on 08/29/2015  . Diabetes mellitus type II, controlled ([place])    a. Variable CBG 02/2012 - several meds adjusted.  Marland Kitchen Dysrhythmia    intermittent Atrial Fibrillation  . GERD (gastroesophageal reflux disease)   . Heart murmur   . Hyperlipidemia   . Hypertension   . Hypertensive heart disease   . Iron deficiency anemia    "gets infusions q once in awhile" (04/27/2018)  . Macular edema bil   lazer work done previously  . Mild aortic stenosis    a. 03/2011 Echo: EF 55-60%, Mild AS. b. Mild by cath 02/2012; c. 01/2014 Echo: EF 65-70%, Gr 1 DD, mild AS, mildly dil LA.  . Multiple gastric ulcers 2006  . Myocardial infarction ([place]) 02/2012   stents (x1) at time  . Myocardial infarction [facility])    "2nd one was after 02/2012; don't know date" (04/27/2018)  . Obesity   . On home oxygen therapy    "2.5 w/CPAP" (04/27/2018)  . OSA on CPAP    CPAP settings 3 with oxygen 2.5 (04/27/2018)  . PAF (paroxysmal atrial fibrillation) ([place])    a. Newly dx 02/2012 & initiated on Coumadin (spont converted to NSR).  . Shortness of breath dyspnea   . Transfusion history    transfusions -1 month ago -2 units    Tobacco Use: Social History   Tobacco Use  Smoking Status Never Smoker  Smokeless Tobacco Never Used  Tobacco Comment   tobacco use - no.no passive smoke in home    Labs: Recent Review Flowsheet Data    Labs for ITP Cardiac and Pulmonary Rehab Latest Ref Rng & Units 02/17/2010 02/17/2010 02/17/2010 03/08/2012 03/09/2012   Cholestrol 0 - 200 mg/dL - - - - 99   LDLCALC 0 - 99 mg/dL - - - - 45   HDL >39 mg/dL - - - - 37(L)   Trlycerides <150 mg/dL - - - - 84   Hemoglobin A1c <5.7 % - - - - -   PHART 7.350 - 7.450 - 7.341(L) 7.379 - -   PCO2ART 35.0 - 45.0 mmHg - 40.6 36.7 - -   HCO3 20.0 - 24.0 mEq/L - 21.8 21.5 - -   TCO2 0 - 100 mmol/L _0 -   ACIDBASEDEF 0.0 - 2.0 mmol/L - 3.0(H) 3.0(H) - -   O2SAT % - 98.0 95.0 - -        Exercise Target Goals: Exercise Program Goal: Individual exercise prescription set using results from initial 6 min walk test  and THRR while considering  patient's activity barriers and safety.   Exercise Prescription Goal: Initial exercise prescription builds to 30-45 minutes a day of aerobic activity, 2-3 days per week.  Home exercise guidelines will be given to patient during program as part of exercise prescription that the participant will acknowledge.  Activity Barriers & Risk Stratification: Activity Barriers & Cardiac Risk Stratification - 05/30/18 1258      Activity Barriers & Cardiac Risk Stratification   Activity Barriers  None    Cardiac Risk Stratification  High       6 Minute Walk: 6 Minute Walk    Row Name 05/30/18 1348         6 Minute Walk   Phase  Initial     Distance  1530 feet     Walk Time  6 minutes     # of Rest Breaks  0     MPH  2.9     METS  3.48     RPE  11     VO2 Peak  12.19     Symptoms  No     Resting HR  48 bpm     Resting BP  136/64     Resting Oxygen Saturation   100 %     Exercise Oxygen Saturation  during 6 min walk  98 %     Max Ex. HR  114 bpm     Max Ex. BP  166/74     2 Minute Post BP  146/74        Oxygen Initial Assessment:   Oxygen Re-Evaluation:   Oxygen Discharge (Final Oxygen Re-Evaluation):   Initial Exercise Prescription: Initial Exercise Prescription - 05/30/18 1300      Date of Initial Exercise RX and Referring Provider   Date  05/30/18    Referring Provider  Kathlyn Sacramento MD      Treadmill   MPH  2.8    Grade  0.5    Minutes  15    METs  3.34      NuStep   Level  4    SPM  80    Minutes  15    METs  3      Recumbant Elliptical   Level  2    RPM  50    Minutes  15    METs  3      Prescription Details   Frequency (times per week)  2    Duration  Progress to 45 minutes of aerobic exercise without signs/symptoms of physical distress      Intensity   THRR 40-80% of Max Heartrate   88-128    Ratings of Perceived Exertion  11-13    Perceived Dyspnea  0-4      Progression   Progression  Continue to progress workloads to maintain intensity without signs/symptoms of physical distress.      Resistance Training   Training Prescription  Yes    Weight  4 lbs    Reps  10-15       Perform Capillary Blood Glucose checks as needed.  Exercise Prescription Changes: Exercise Prescription Changes    Row Name 05/30/18 1300             Response to Exercise   Blood Pressure (Admit)  136/64       Blood Pressure (Exercise)  166/74       Blood Pressure (Exit)  146/74       Heart Rate (  Admit)  48 bpm       Heart Rate (Exercise)  114 bpm       Heart Rate (Exit)  66 bpm       Oxygen Saturation (Admit)  100 %       Oxygen Saturation (Exercise)  98 %       Rating of Perceived Exertion (Exercise)  11       Symptoms  none       Comments  walk test results          Exercise Comments:   Exercise Goals and Review: Exercise Goals    Row Name 05/30/18 1351             Exercise Goals   Increase Physical Activity  Yes       Intervention  Provide advice, education, support and counseling about physical activity/exercise needs.;Develop an individualized exercise prescription for aerobic and resistive training based on initial evaluation findings, risk stratification, comorbidities and participant's personal goals.       Expected Outcomes  Short Term: Attend rehab on a regular basis to increase amount of physical activity.;Long Term: Add in home exercise to make exercise part of routine and to increase amount of physical activity.;Long Term: Exercising regularly at least 3-5 days a week.       Increase Strength and Stamina  Yes       Intervention  Provide advice, education, support and counseling about physical activity/exercise needs.;Develop an individualized exercise prescription for aerobic and resistive training based on initial evaluation findings, risk stratification,  comorbidities and participant's personal goals.       Expected Outcomes  Short Term: Increase workloads from initial exercise prescription for resistance, speed, and METs.;Short Term: Perform resistance training exercises routinely during rehab and add in resistance training at home;Long Term: Improve cardiorespiratory fitness, muscular endurance and strength as measured by increased METs and functional capacity (6MWT)       Able to understand and use rate of perceived exertion (RPE) scale  Yes       Intervention  Provide education and explanation on how to use RPE scale       Expected Outcomes  Short Term: Able to use RPE daily in rehab to express subjective intensity level;Long Term:  Able to use RPE to guide intensity level when exercising independently       Knowledge and understanding of Target Heart Rate Range (THRR)  Yes       Intervention  Provide education and explanation of THRR including how the numbers were predicted and where they are located for reference       Expected Outcomes  Short Term: Able to state/look up THRR;Short Term: Able to use daily as guideline for intensity in rehab;Long Term: Able to use THRR to govern intensity when exercising independently       Able to check pulse independently  Yes       Intervention  Provide education and demonstration on how to check pulse in carotid and radial arteries.;Review the importance of being able to check your own pulse for safety during independent exercise       Expected Outcomes  Short Term: Able to explain why pulse checking is important during independent exercise;Long Term: Able to check pulse independently and accurately       Understanding of Exercise Prescription  Yes       Intervention  Provide education, explanation, and written materials on patient's individual exercise prescription       Expected  Outcomes  Short Term: Able to explain program exercise prescription;Long Term: Able to explain home exercise prescription to  exercise independently          Exercise Goals Re-Evaluation :   Discharge Exercise Prescription (Final Exercise Prescription Changes): Exercise Prescription Changes - 05/30/18 1300      Response to Exercise   Blood Pressure (Admit)  136/64    Blood Pressure (Exercise)  166/74    Blood Pressure (Exit)  146/74    Heart Rate (Admit)  48 bpm    Heart Rate (Exercise)  114 bpm    Heart Rate (Exit)  66 bpm    Oxygen Saturation (Admit)  100 %    Oxygen Saturation (Exercise)  98 %    Rating of Perceived Exertion (Exercise)  11    Symptoms  none    Comments  walk test results       Nutrition:  Target Goals: Understanding of nutrition guidelines, daily intake of sodium <15101m, cholesterol <2025m calories 30% from fat and 7% or less from saturated fats, daily to have 5 or more servings of fruits and vegetables.  Biometrics: Pre Biometrics - 05/30/18 1352      Pre Biometrics   Height  5' 7" (1.702 m)    Weight  190 lb 14.4 oz (86.6 kg)    Waist Circumference  40 inches    Hip Circumference  38 inches    Waist to Hip Ratio  1.05 %    BMI (Calculated)  29.89    Single Leg Stand  17.43 seconds        Nutrition Therapy Plan and Nutrition Goals: Nutrition Therapy & Goals - 05/30/18 1253      Intervention Plan   Intervention  Prescribe, educate and counsel regarding individualized specific dietary modifications aiming towards targeted core components such as weight, hypertension, lipid management, diabetes, heart failure and other comorbidities.;Nutrition handout(s) given to patient.    Expected Outcomes  Short Term Goal: Understand basic principles of dietary content, such as calories, fat, sodium, cholesterol and nutrients.;Short Term Goal: A plan has been developed with personal nutrition goals set during dietitian appointment.;Long Term Goal: Adherence to prescribed nutrition plan.       Nutrition Assessments: Nutrition Assessments - 05/30/18 1253      MEDFICTS Scores    Pre Score  41       Nutrition Goals Re-Evaluation:   Nutrition Goals Discharge (Final Nutrition Goals Re-Evaluation):   Psychosocial: Target Goals: Acknowledge presence or absence of significant depression and/or stress, maximize coping skills, provide positive support system. Participant is able to verbalize types and ability to use techniques and skills needed for reducing stress and depression.   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 He enjoys his part time jobs and his family life. He reports good sleep habits and no current stress concerns.     Family Dynamics   Good Support System?  Yes   Wife and sons     Barriers   Psychosocial barriers to participate in program  There are no identifiable barriers or psychosocial needs.;The patient should benefit from training in stress management and relaxation.      Screening Interventions   Interventions  Encouraged to exercise;Program counselor consult;To provide support and resources with identified psychosocial needs;Provide  feedback about the scores to participant    Expected Outcomes  Short Term goal: Utilizing psychosocial counselor, staff and physician to assist with identification of specific Stressors or current issues interfering with healing process. Setting desired goal for each stressor or current issue identified.;Long Term Goal: Stressors or current issues are controlled or eliminated.;Short Term goal: Identification and review with participant of any Quality of Life or Depression concerns found by scoring the questionnaire.;Long Term goal: The participant improves quality of Life and PHQ9 Scores as seen by post scores and/or verbalization of changes       Quality of Life Scores:  Quality of Life - 05/30/18 1252       Quality of Life   Select  Quality of Life      Quality of Life Scores   Health/Function Pre  25.88 %    Socioeconomic Pre  30 %    Psych/Spiritual Pre  29.14 %    Family Pre  30 %    GLOBAL Pre  28.08 %      Scores of 19 and below usually indicate a poorer quality of life in these areas.  A difference of  2-3 points is a clinically meaningful difference.  A difference of 2-3 points in the total score of the Quality of Life Index has been associated with significant improvement in overall quality of life, self-image, physical symptoms, and general health in studies assessing change in quality of life.  PHQ-9: Recent Review Flowsheet Data    Depression screen [facility] 2/9 05/30/2018   Decreased Interest 0   Down, Depressed, Hopeless 0   PHQ - 2 Score 0   Altered sleeping 0   Tired, decreased energy 0   Change in appetite 0   Feeling bad or failure about yourself  0   Trouble concentrating 0   Moving slowly or fidgety/restless 0   Suicidal thoughts 0   PHQ-9 Score 0     Interpretation of Total Score  Total Score Depression Severity:  1-4 = Minimal depression, 5-9 = Mild depression, 10-14 = Moderate depression, 15-19 = Moderately severe depression, 20-27 = Severe depression   Psychosocial Evaluation and Intervention:   Psychosocial Re-Evaluation:   Psychosocial Discharge (Final Psychosocial Re-Evaluation):   Vocational Rehabilitation: Provide vocational rehab assistance to qualifying candidates.   Vocational Rehab Evaluation & Intervention: Vocational Rehab - 05/30/18 1251      Initial Vocational Rehab Evaluation & Intervention   Assessment shows need for Vocational Rehabilitation  No       Education: Education Goals: Education classes will be provided on a variety of topics geared toward better understanding of heart health and risk factor modification. Participant will state understanding/return demonstration of topics presented as noted by education test  scores.  Learning Barriers/Preferences: Learning Barriers/Preferences - 05/30/18 1250      Learning Barriers/Preferences   Learning Barriers  None    Learning Preferences  Individual Instruction       Education Topics:  AED/CPR: - Group verbal and written instruction with the use of models to demonstrate the basic use of the AED with the basic ABC's of resuscitation.   General Nutrition Guidelines/Fats and Fiber: -Group instruction provided by verbal, written material, models and posters to present the general guidelines for heart healthy nutrition. Gives an explanation and review of dietary fats and fiber.   Controlling Sodium/Reading Food Labels: -Group verbal and written material supporting the discussion of sodium use in heart healthy nutrition. Review and explanation with models, verbal and written  materials for utilization of the food label.   Exercise Physiology & General Exercise Guidelines: - Group verbal and written instruction with models to review the exercise physiology of the cardiovascular system and associated critical values. Provides general exercise guidelines with specific guidelines to those with heart or lung disease.    Aerobic Exercise & Resistance Training: - Gives group verbal and written instruction on the various components of exercise. Focuses on aerobic and resistive training programs and the benefits of this training and how to safely progress through these programs..   Flexibility, Balance, Mind/Body Relaxation: Provides group verbal/written instruction on the benefits of flexibility and balance training, including mind/body exercise modes such as yoga, pilates and tai chi.  Demonstration and skill practice provided.   Stress and Anxiety: - Provides group verbal and written instruction about the health risks of elevated stress and causes of high stress.  Discuss the correlation between heart/lung disease and anxiety and treatment options. Review  healthy ways to manage with stress and anxiety.   Depression: - Provides group verbal and written instruction on the correlation between heart/lung disease and depressed mood, treatment options, and the stigmas associated with seeking treatment.   Anatomy & Physiology of the Heart: - Group verbal and written instruction and models provide basic cardiac anatomy and physiology, with the coronary electrical and arterial systems. Review of Valvular disease and Heart Failure   Cardiac Procedures: - Group verbal and written instruction to review commonly prescribed medications for heart disease. Reviews the medication, class of the drug, and side effects. Includes the steps to properly store meds and maintain the prescription regimen. (beta blockers and nitrates)   Cardiac Medications I: - Group verbal and written instruction to review commonly prescribed medications for heart disease. Reviews the medication, class of the drug, and side effects. Includes the steps to properly store meds and maintain the prescription regimen.   Cardiac Medications II: -Group verbal and written instruction to review commonly prescribed medications for heart disease. Reviews the medication, class of the drug, and side effects. (all other drug classes)    Go Sex-Intimacy & Heart Disease, Get SMART - Goal Setting: - Group verbal and written instruction through game format to discuss heart disease and the return to sexual intimacy. Provides group verbal and written material to discuss and apply goal setting through the application of the S.M.A.R.T. Method.   Other Matters of the Heart: - Provides group verbal, written materials and models to describe Stable Angina and Peripheral Artery. Includes description of the disease process and treatment options available to the cardiac patient.   Exercise & Equipment Safety: - Individual verbal instruction and demonstration of equipment use and safety with use of the  equipment.   Cardiac Rehab from 05/30/2018 in [facility] Cardiac and Pulmonary Rehab  Date  05/30/18  Educator  [facility]  Instruction Review Code  1- Verbalizes Understanding      Infection Prevention: - Provides verbal and written material to individual with discussion of infection control including proper hand washing and proper equipment cleaning during exercise session.   Cardiac Rehab from 05/30/2018 in [facility] Cardiac and Pulmonary Rehab  Date  05/30/18  Educator  [facility]  Instruction Review Code  1- Verbalizes Understanding      Falls Prevention: - Provides verbal and written material to individual with discussion of falls prevention and safety.   Cardiac Rehab from 05/30/2018 in [facility] Cardiac and Pulmonary Rehab  Date  05/30/18  Educator  [facility]  Instruction Review Code  1- [employer]  Understanding      Diabetes: - Individual verbal and written instruction to review signs/symptoms of diabetes, desired ranges of glucose level fasting, after meals and with exercise. Acknowledge that pre and post exercise glucose checks will be done for 3 sessions at entry of program.   Cardiac Rehab from 05/30/2018 in [facility] Cardiac and Pulmonary Rehab  Date  05/30/18  Educator  [facility]  Instruction Review Code  1- Verbalizes Understanding      Know Your Numbers and Risk Factors: -Group verbal and written instruction about important numbers in your health.  Discussion of what are risk factors and how they play a role in the disease process.  Review of Cholesterol, Blood Pressure, Diabetes, and BMI and the role they play in your overall health.   Sleep Hygiene: -Provides group verbal and written instruction about how sleep can affect your health.  Define sleep hygiene, discuss sleep cycles and impact of sleep habits. Review good sleep hygiene tips.    Other: -Provides group and verbal instruction on various topics (see comments)   Knowledge Questionnaire Score: Knowledge Questionnaire Score - 05/30/18 1250       Knowledge Questionnaire Score   Pre Score  23/26   correct answers reviewed wtih Elta [place]. Focus on anatomy and exercise      Core Components/Risk Factors/Patient Goals at Admission: Personal Goals and Risk Factors at Admission - 05/30/18 1239      Core Components/Risk Factors/Patient Goals on Admission    Weight Management  Yes;Weight Loss    Intervention  Weight Management: Develop a combined nutrition and exercise program designed to reach desired caloric intake, while maintaining appropriate intake of nutrient and fiber, sodium and fats, and appropriate energy expenditure required for the weight goal.;Weight Management: Provide education and appropriate resources to help participant work on and attain dietary goals.;Weight Management/Obesity: Establish reasonable short term and long term weight goals.    Admit Weight  190 lb 14.4 oz (86.6 kg)    Goal Weight: Short Term  185 lb (83.9 kg)    Goal Weight: Long Term  180 lb (81.6 kg)    Expected Outcomes  Short Term: Continue to assess and modify interventions until short term weight is achieved;Long Term: Adherence to nutrition and physical activity/exercise program aimed toward attainment of established weight goal;Weight Loss: Understanding of general recommendations for a balanced deficit meal plan, which promotes 1-2 lb weight loss per week and includes a negative energy balance of [phone] kcal/d;Understanding recommendations for meals to include 15-35% energy as protein, 25-35% energy from fat, 35-60% energy from carbohydrates, less than 237m of dietary cholesterol, 20-35 gm of total fiber daily;Understanding of distribution of calorie intake throughout the day with the consumption of 4-5 meals/snacks    Diabetes  Yes    Intervention  Provide education about signs/symptoms and action to take for hypo/hyperglycemia.;Provide education about proper nutrition, including hydration, and aerobic/resistive exercise prescription along with prescribed  medications to achieve blood glucose in normal ranges: Fasting glucose 65-99 mg/dL    Expected Outcomes  Short Term: Participant verbalizes understanding of the signs/symptoms and immediate care of hyper/hypoglycemia, proper foot care and importance of medication, aerobic/resistive exercise and nutrition plan for blood glucose control.;Long Term: Attainment of HbA1C < 7%.    Hypertension  Yes    Intervention  Provide education on lifestyle modifcations including regular physical activity/exercise, weight management, moderate sodium restriction and increased consumption of fresh fruit, vegetables, and low fat dairy, alcohol moderation, and smoking cessation.;Monitor prescription use compliance.  Expected Outcomes  Short Term: Continued assessment and intervention until BP is < 140/53m HG in hypertensive participants. < 130/873mHG in hypertensive participants with diabetes, heart failure or chronic kidney disease.;Long Term: Maintenance of blood pressure at goal levels.    Lipids  Yes    Intervention  Provide education and support for participant on nutrition & aerobic/resistive exercise along with prescribed medications to achieve LDL <701mHDL >46m36m  Expected Outcomes  Short Term: Participant states understanding of desired cholesterol values and is compliant with medications prescribed. Participant is following exercise prescription and nutrition guidelines.;Long Term: Cholesterol controlled with medications as prescribed, with individualized exercise RX and with personalized nutrition plan. Value goals: LDL < 70mg65mL > 40 mg.       Core Components/Risk Factors/Patient Goals Review:    Core Components/Risk Factors/Patient Goals at Discharge (Final Review):    ITP Comments: ITP Comments    Row Name 05/30/18 1221           ITP Comments  Med Review completed. Initial ITP created. Diagnosis can be found in [facility]          Comments: Initial ITP

## 2018-05-30 NOTE — Patient Instructions (Signed)
Patient Instructions  Patient Details  Name: Seth Hardy MRN: [id number] Date of Birth: 31-Mar-1946 Referring Provider:  Wellington Hampshire, MD  Below are your personal goals for exercise, nutrition, and risk factors. Our goal is to help you stay on track towards obtaining and maintaining these goals. We will be discussing your progress on these goals with you throughout the program.  Initial Exercise Prescription: Initial Exercise Prescription - 05/30/18 1300      Date of Initial Exercise RX and Referring Provider   Date  05/30/18    Referring Provider  Kathlyn Sacramento MD      Treadmill   MPH  2.8    Grade  0.5    Minutes  15    METs  3.34      NuStep   Level  4    SPM  80    Minutes  15    METs  3      Recumbant Elliptical   Level  2    RPM  50    Minutes  15    METs  3      Prescription Details   Frequency (times per week)  2    Duration  Progress to 45 minutes of aerobic exercise without signs/symptoms of physical distress      Intensity   THRR 40-80% of Max Heartrate  88-128    Ratings of Perceived Exertion  11-13    Perceived Dyspnea  0-4      Progression   Progression  Continue to progress workloads to maintain intensity without signs/symptoms of physical distress.      Resistance Training   Training Prescription  Yes    Weight  4 lbs    Reps  10-15       Exercise Goals: Frequency: Be able to perform aerobic exercise two to three times per week in program working toward 2-5 days per week of home exercise.  Intensity: Work with a perceived exertion of 11 (fairly light) - 15 (hard) while following your exercise prescription.  We will make changes to your prescription with you as you progress through the program.   Duration: Be able to do 30 to 45 minutes of continuous aerobic exercise in addition to a 5 minute warm-up and a 5 minute cool-down routine.   Nutrition Goals: Your personal nutrition goals will be established when you do your nutrition  analysis with the dietician.  The following are general nutrition guidelines to follow: Cholesterol < 200mg /day Sodium < 1500mg /day Fiber: Men over 50 yrs - 30 grams per day  Personal Goals: Personal Goals and Risk Factors at Admission - 05/30/18 1239      Core Components/Risk Factors/Patient Goals on Admission    Weight Management  Yes;Weight Loss    Intervention  Weight Management: Develop a combined nutrition and exercise program designed to reach desired caloric intake, while maintaining appropriate intake of nutrient and fiber, sodium and fats, and appropriate energy expenditure required for the weight goal.;Weight Management: Provide education and appropriate resources to help participant work on and attain dietary goals.;Weight Management/Obesity: Establish reasonable short term and long term weight goals.    Admit Weight  190 lb 14.4 oz (86.6 kg)    Goal Weight: Short Term  185 lb (83.9 kg)    Goal Weight: Long Term  180 lb (81.6 kg)    Expected Outcomes  Short Term: Continue to assess and modify interventions until short term weight is achieved;Long Term: Adherence to nutrition and physical  activity/exercise program aimed toward attainment of established weight goal;Weight Loss: Understanding of general recommendations for a balanced deficit meal plan, which promotes 1-2 lb weight loss per week and includes a negative energy balance of [phone] kcal/d;Understanding recommendations for meals to include 15-35% energy as protein, 25-35% energy from fat, 35-60% energy from carbohydrates, less than 200mg  of dietary cholesterol, 20-35 gm of total fiber daily;Understanding of distribution of calorie intake throughout the day with the consumption of 4-5 meals/snacks    Diabetes  Yes    Intervention  Provide education about signs/symptoms and action to take for hypo/hyperglycemia.;Provide education about proper nutrition, including hydration, and aerobic/resistive exercise prescription along with  prescribed medications to achieve blood glucose in normal ranges: Fasting glucose 65-99 mg/dL    Expected Outcomes  Short Term: Participant verbalizes understanding of the signs/symptoms and immediate care of hyper/hypoglycemia, proper foot care and importance of medication, aerobic/resistive exercise and nutrition plan for blood glucose control.;Long Term: Attainment of HbA1C < 7%.    Hypertension  Yes    Intervention  Provide education on lifestyle modifcations including regular physical activity/exercise, weight management, moderate sodium restriction and increased consumption of fresh fruit, vegetables, and low fat dairy, alcohol moderation, and smoking cessation.;Monitor prescription use compliance.    Expected Outcomes  Short Term: Continued assessment and intervention until BP is < 140/77mm HG in hypertensive participants. < 130/47mm HG in hypertensive participants with diabetes, heart failure or chronic kidney disease.;Long Term: Maintenance of blood pressure at goal levels.    Lipids  Yes    Intervention  Provide education and support for participant on nutrition & aerobic/resistive exercise along with prescribed medications to achieve LDL 70mg , HDL >40mg .    Expected Outcomes  Short Term: Participant states understanding of desired cholesterol values and is compliant with medications prescribed. Participant is following exercise prescription and nutrition guidelines.;Long Term: Cholesterol controlled with medications as prescribed, with individualized exercise RX and with personalized nutrition plan. Value goals: LDL < 70mg , HDL > 40 mg.       Tobacco Use Initial Evaluation: Social History   Tobacco Use  Smoking Status Never Smoker  Smokeless Tobacco Never Used  Tobacco Comment   tobacco use - no.no passive smoke in home    Exercise Goals and Review: Exercise Goals    Row Name 05/30/18 1351             Exercise Goals   Increase Physical Activity  Yes       Intervention   Provide advice, education, support and counseling about physical activity/exercise needs.;Develop an individualized exercise prescription for aerobic and resistive training based on initial evaluation findings, risk stratification, comorbidities and participant's personal goals.       Expected Outcomes  Short Term: Attend rehab on a regular basis to increase amount of physical activity.;Long Term: Add in home exercise to make exercise part of routine and to increase amount of physical activity.;Long Term: Exercising regularly at least 3-5 days a week.       Increase Strength and Stamina  Yes       Intervention  Provide advice, education, support and counseling about physical activity/exercise needs.;Develop an individualized exercise prescription for aerobic and resistive training based on initial evaluation findings, risk stratification, comorbidities and participant's personal goals.       Expected Outcomes  Short Term: Increase workloads from initial exercise prescription for resistance, speed, and METs.;Short Term: Perform resistance training exercises routinely during rehab and add in resistance training at home;Long Term: Improve cardiorespiratory fitness, muscular  endurance and strength as measured by increased METs and functional capacity (6MWT)       Able to understand and use rate of perceived exertion (RPE) scale  Yes       Intervention  Provide education and explanation on how to use RPE scale       Expected Outcomes  Short Term: Able to use RPE daily in rehab to express subjective intensity level;Long Term:  Able to use RPE to guide intensity level when exercising independently       Knowledge and understanding of Target Heart Rate Range (THRR)  Yes       Intervention  Provide education and explanation of THRR including how the numbers were predicted and where they are located for reference       Expected Outcomes  Short Term: Able to state/look up THRR;Short Term: Able to use daily as  guideline for intensity in rehab;Long Term: Able to use THRR to govern intensity when exercising independently       Able to check pulse independently  Yes       Intervention  Provide education and demonstration on how to check pulse in carotid and radial arteries.;Review the importance of being able to check your own pulse for safety during independent exercise       Expected Outcomes  Short Term: Able to explain why pulse checking is important during independent exercise;Long Term: Able to check pulse independently and accurately       Understanding of Exercise Prescription  Yes       Intervention  Provide education, explanation, and written materials on patient's individual exercise prescription       Expected Outcomes  Short Term: Able to explain program exercise prescription;Long Term: Able to explain home exercise prescription to exercise independently          Copy of goals given to participant.

## 2018-06-03 ENCOUNTER — Ambulatory Visit: Payer: PPO | Admitting: Cardiovascular Disease

## 2018-06-03 ENCOUNTER — Encounter: Payer: PPO | Admitting: *Deleted

## 2018-06-03 DIAGNOSIS — Z955 Presence of coronary angioplasty implant and graft: Secondary | ICD-10-CM | POA: Diagnosis not present

## 2018-06-03 LAB — GLUCOSE, CAPILLARY
Glucose-Capillary: 187 mg/dL — ABNORMAL HIGH (ref 70–99)
Glucose-Capillary: 145 mg/dL — ABNORMAL HIGH (ref 70–99)

## 2018-06-03 NOTE — Progress Notes (Signed)
Daily Session Note  Patient Details  Name: Seth Hardy MRN: [id number] Date of Birth: November 20, 1945 Referring Provider:     Cardiac Rehab from 05/30/2018 in [facility] Cardiac and Pulmonary Rehab  Referring Provider  Kathlyn Sacramento MD      Encounter Date: 06/03/2018  Check In: Session Check In - 06/03/18 0751      Check-In   Supervising physician immediately available to respond to emergencies  See telemetry face sheet for immediately available ER MD    Location  [facility]-Cardiac & Pulmonary Rehab    Staff Present  Renita Papa, RN BSN;Maven Varelas Luan Pulling, MA, RCEP, CCRP, Exercise Physiologist;Amanda Oletta Darter, [place], ACSM CEP, Exercise Physiologist    Medication changes reported      No    Fall or balance concerns reported     No    Warm-up and Cool-down  Performed as group-led instruction    Resistance Training Performed  Yes    VAD Patient?  No    PAD/SET Patient?  No      Pain Assessment   Currently in Pain?  No/denies          Social History   Tobacco Use  Smoking Status Never Smoker  Smokeless Tobacco Never Used  Tobacco Comment   tobacco use - no.no passive smoke in home    Goals Met:  Exercise tolerated well Personal goals reviewed No report of cardiac concerns or symptoms Strength training completed today  Goals Unmet:  Not Applicable  Comments: First full day of exercise!  Patient was oriented to gym and equipment including functions, settings, policies, and procedures.  Patient's individual exercise prescription and treatment plan were reviewed.  All starting workloads were established based on the results of the 6 minute walk test done at initial orientation visit.  The plan for exercise progression was also introduced and progression will be customized based on patient's performance and goals.    Dr. Emily Filbert is Medical Director for [place] and [facility].

## 2018-06-06 ENCOUNTER — Encounter: Payer: PPO | Admitting: *Deleted

## 2018-06-06 DIAGNOSIS — Z955 Presence of coronary angioplasty implant and graft: Secondary | ICD-10-CM

## 2018-06-06 LAB — GLUCOSE, CAPILLARY
Glucose-Capillary: 135 mg/dL — ABNORMAL HIGH (ref 70–99)
Glucose-Capillary: 144 mg/dL — ABNORMAL HIGH (ref 70–99)

## 2018-06-06 NOTE — Progress Notes (Signed)
Daily Session Note  Patient Details  Name: Seth Hardy MRN: [id number] Date of Birth: 09/24/1945 Referring Provider:     Cardiac Rehab from 05/30/2018 in [facility] Cardiac and Pulmonary Rehab  Referring Provider  Kathlyn Sacramento MD      Encounter Date: 06/06/2018  Check In: Session Check In - 06/06/18 0837      Check-In   Supervising physician immediately available to respond to emergencies  See telemetry face sheet for immediately available ER MD    Location  [facility]-Cardiac & Pulmonary Rehab    Staff Present  Renita Papa, RN BSN;Cynthia Cogle Luan Pulling, MA, RCEP, CCRP, Exercise Physiologist;Kelly Amedeo Plenty, BS, ACSM CEP, Exercise Physiologist    Medication changes reported      No    Fall or balance concerns reported     No    Warm-up and Cool-down  Performed as group-led instruction    Resistance Training Performed  Yes    VAD Patient?  No    PAD/SET Patient?  No      Pain Assessment   Currently in Pain?  No/denies          Social History   Tobacco Use  Smoking Status Never Smoker  Smokeless Tobacco Never Used  Tobacco Comment   tobacco use - no.no passive smoke in home    Goals Met:  Independence with exercise equipment Exercise tolerated well No report of cardiac concerns or symptoms Strength training completed today  Goals Unmet:  Not Applicable  Comments: Pt able to follow exercise prescription today without complaint.  Will continue to monitor for progression.    Dr. Emily Filbert is Medical Director for [place] and [facility].

## 2018-06-08 ENCOUNTER — Ambulatory Visit (INDEPENDENT_AMBULATORY_CARE_PROVIDER_SITE_OTHER): Payer: PPO

## 2018-06-08 ENCOUNTER — Telehealth: Payer: Self-pay | Admitting: Internal Medicine

## 2018-06-08 DIAGNOSIS — I4891 Unspecified atrial fibrillation: Secondary | ICD-10-CM | POA: Diagnosis not present

## 2018-06-08 DIAGNOSIS — Z7901 Long term (current) use of anticoagulants: Secondary | ICD-10-CM

## 2018-06-08 LAB — POCT INR: INR: 3.6 — AB (ref 2.0–3.0)

## 2018-06-08 NOTE — Telephone Encounter (Signed)
Erroneous encounter

## 2018-06-08 NOTE — Patient Instructions (Signed)
Since you missed yesterday's 1/2 tablet, just take 1/2 tablet tonight, then continue dosage 1 tablet daily except 1/2 tablet on Tuesdays and Saturdays.   Be consistent w/ your greens intake.  Recheck INR in 3 weeks.

## 2018-06-10 ENCOUNTER — Encounter: Payer: PPO | Admitting: *Deleted

## 2018-06-10 DIAGNOSIS — Z955 Presence of coronary angioplasty implant and graft: Secondary | ICD-10-CM | POA: Diagnosis not present

## 2018-06-10 LAB — GLUCOSE, CAPILLARY
Glucose-Capillary: 125 mg/dL — ABNORMAL HIGH (ref 70–99)
Glucose-Capillary: 144 mg/dL — ABNORMAL HIGH (ref 70–99)

## 2018-06-10 NOTE — Progress Notes (Signed)
Daily Session Note  Patient Details  Name: Seth Hardy MRN: [id number] Date of Birth: 04-28-1946 Referring Provider:     Cardiac Rehab from 05/30/2018 in [facility] Cardiac and Pulmonary Rehab  Referring Provider  Kathlyn Sacramento MD      Encounter Date: 06/10/2018  Check In: Session Check In - 06/10/18 3015      Check-In   Supervising physician immediately available to respond to emergencies  See telemetry face sheet for immediately available ER MD    Location  [facility]-Cardiac & Pulmonary Rehab    Staff Present  Renita Papa, RN BSN;Jessica Luan Pulling, MA, RCEP, CCRP, Exercise Physiologist;Amanda Oletta Darter, [place], ACSM CEP, Exercise Physiologist    Medication changes reported      No    Fall or balance concerns reported     No    Warm-up and Cool-down  Performed as group-led instruction    Resistance Training Performed  Yes    VAD Patient?  No    PAD/SET Patient?  No      Pain Assessment   Currently in Pain?  No/denies          Social History   Tobacco Use  Smoking Status Never Smoker  Smokeless Tobacco Never Used  Tobacco Comment   tobacco use - no.no passive smoke in home    Goals Met:  Independence with exercise equipment Exercise tolerated well No report of cardiac concerns or symptoms Strength training completed today  Goals Unmet:  Not Applicable  Comments: Pt able to follow exercise prescription today without complaint.  Will continue to monitor for progression.    Dr. Emily Filbert is Medical Director for [place] and [facility].

## 2018-06-13 ENCOUNTER — Ambulatory Visit (INDEPENDENT_AMBULATORY_CARE_PROVIDER_SITE_OTHER): Payer: PPO | Admitting: Vascular Surgery

## 2018-06-13 ENCOUNTER — Encounter: Payer: PPO | Admitting: *Deleted

## 2018-06-13 ENCOUNTER — Encounter (INDEPENDENT_AMBULATORY_CARE_PROVIDER_SITE_OTHER): Payer: PPO

## 2018-06-13 DIAGNOSIS — Z955 Presence of coronary angioplasty implant and graft: Secondary | ICD-10-CM

## 2018-06-13 LAB — GLUCOSE, CAPILLARY: Glucose-Capillary: 154 mg/dL — ABNORMAL HIGH (ref 70–99)

## 2018-06-13 NOTE — Progress Notes (Signed)
Daily Session Note  Patient Details  Name: Seth Hardy MRN: [id number] Date of Birth: 08-18-1945 Referring Provider:     Cardiac Rehab from 05/30/2018 in [facility] Cardiac and Pulmonary Rehab  Referring Provider  Kathlyn Sacramento MD      Encounter Date: 06/13/2018  Check In: Session Check In - 06/13/18 0758      Check-In   Supervising physician immediately available to respond to emergencies  See telemetry face sheet for immediately available ER MD    Location  [facility]-Cardiac & Pulmonary Rehab    Staff Present  Earlean Shawl, BS, ACSM CEP, Exercise Physiologist;Jessica Luan Pulling, MA, RCEP, CCRP, Exercise Physiologist;Susanne Bice, RN, BSN, CCRP    Medication changes reported      No    Fall or balance concerns reported     No    Tobacco Cessation  No Change    Warm-up and Cool-down  Performed as group-led instruction    Resistance Training Performed  Yes    VAD Patient?  No    PAD/SET Patient?  No      Pain Assessment   Currently in Pain?  No/denies    Multiple Pain Sites  No          Social History   Tobacco Use  Smoking Status Never Smoker  Smokeless Tobacco Never Used  Tobacco Comment   tobacco use - no.no passive smoke in home    Goals Met:  Independence with exercise equipment Exercise tolerated well No report of cardiac concerns or symptoms Strength training completed today  Goals Unmet:  Not Applicable  Comments: Pt able to follow exercise prescription today without complaint.  Will continue to monitor for progression.  Reviewed home exercise with pt today.  Pt plans to walk at home and use weights for exercise.  Reviewed THR, pulse, RPE, sign and symptoms, NTG use, and when to call 911 or MD.  Also discussed weather considerations and indoor options.  Pt voiced understanding.   Dr. Emily Filbert is Medical Director for [place] and [facility].

## 2018-06-17 ENCOUNTER — Encounter: Payer: PPO | Admitting: *Deleted

## 2018-06-17 DIAGNOSIS — Z955 Presence of coronary angioplasty implant and graft: Secondary | ICD-10-CM

## 2018-06-17 NOTE — Progress Notes (Signed)
Daily Session Note  Patient Details  Name: SUMNER KIRCHMAN MRN: [id number] Date of Birth: 02/05/1946 Referring Provider:     Cardiac Rehab from 05/30/2018 in [facility] Cardiac and Pulmonary Rehab  Referring Provider  Kathlyn Sacramento MD      Encounter Date: 06/17/2018  Check In: Session Check In - 06/17/18 0843      Check-In   Supervising physician immediately available to respond to emergencies  See telemetry face sheet for immediately available ER MD    Location  [facility]-Cardiac & Pulmonary Rehab    Staff Present  Renita Papa, RN BSN;Allisyn Kunz Luan Pulling, MA, RCEP, CCRP, Exercise Physiologist;Amanda Oletta Darter, [place], ACSM CEP, Exercise Physiologist    Medication changes reported      No    Fall or balance concerns reported     No    Warm-up and Cool-down  Performed as group-led instruction    Resistance Training Performed  Yes    VAD Patient?  No    PAD/SET Patient?  No      Pain Assessment   Currently in Pain?  No/denies          Social History   Tobacco Use  Smoking Status Never Smoker  Smokeless Tobacco Never Used  Tobacco Comment   tobacco use - no.no passive smoke in home    Goals Met:  Independence with exercise equipment Exercise tolerated well No report of cardiac concerns or symptoms Strength training completed today  Goals Unmet:  Not Applicable  Comments: Pt able to follow exercise prescription today without complaint.  Will continue to monitor for progression.    Dr. Emily Filbert is Medical Director for [place] and [facility].

## 2018-06-20 ENCOUNTER — Encounter: Payer: PPO | Admitting: *Deleted

## 2018-06-20 DIAGNOSIS — Z955 Presence of coronary angioplasty implant and graft: Secondary | ICD-10-CM | POA: Diagnosis not present

## 2018-06-20 LAB — GLUCOSE, CAPILLARY
Glucose-Capillary: 132 mg/dL — ABNORMAL HIGH (ref 70–99)
Glucose-Capillary: 132 mg/dL — ABNORMAL HIGH (ref 70–99)

## 2018-06-20 NOTE — Progress Notes (Signed)
Daily Session Note  Patient Details  Name: Seth Hardy MRN: [id number] Date of Birth: 04-15-1946 Referring Provider:     Cardiac Rehab from 05/30/2018 in [facility] Cardiac and Pulmonary Rehab  Referring Provider  Kathlyn Sacramento MD      Encounter Date: 06/20/2018  Check In: Session Check In - 06/20/18 0743      Check-In   Supervising physician immediately available to respond to emergencies  See telemetry face sheet for immediately available ER MD    Location  [facility]-Cardiac & Pulmonary Rehab    Staff Present  Constance Goltz, RN BSN;Dillyn Joaquin Luan Pulling, MA, RCEP, CCRP, Exercise Physiologist;Kelly Amedeo Plenty, BS, ACSM CEP, Exercise Physiologist    Medication changes reported      No    Fall or balance concerns reported     No    Warm-up and Cool-down  Performed as group-led instruction    Resistance Training Performed  Yes    VAD Patient?  No    PAD/SET Patient?  No      Pain Assessment   Currently in Pain?  No/denies          Social History   Tobacco Use  Smoking Status Never Smoker  Smokeless Tobacco Never Used  Tobacco Comment   tobacco use - no.no passive smoke in home    Goals Met:  Independence with exercise equipment Exercise tolerated well No report of cardiac concerns or symptoms Strength training completed today  Goals Unmet:  Not Applicable  Comments: Pt able to follow exercise prescription today without complaint.  Will continue to monitor for progression.    Dr. Emily Filbert is Medical Director for [place] and [facility].

## 2018-06-21 ENCOUNTER — Other Ambulatory Visit: Payer: PPO

## 2018-06-22 DIAGNOSIS — Z955 Presence of coronary angioplasty implant and graft: Secondary | ICD-10-CM

## 2018-06-22 NOTE — Progress Notes (Signed)
Cardiac Individual Treatment Plan  Patient Details  Name: Seth Hardy MRN: [id number] Date of Birth: Dec 19, 1945 Referring Provider:     Cardiac Rehab from 05/30/2018 in [facility] Cardiac and Pulmonary Rehab  Referring Provider  Kathlyn Sacramento MD      Initial Encounter Date:    Cardiac Rehab from 05/30/2018 in [facility] Cardiac and Pulmonary Rehab  Date  05/30/18      Visit Diagnosis: Status post coronary artery stent placement  Patient's Home Medications on Admission:  Current Outpatient Medications:  .  acetaminophen (TYLENOL) 500 MG tablet, Take 1,000 mg by mouth every 6 (six) hours as needed for moderate pain or headache., Disp: , Rfl:  .  ALPRAZolam (XANAX) 0.25 MG tablet, Take 0.25 mg by mouth daily as needed for anxiety., Disp: , Rfl:  .  amiodarone (PACERONE) 200 MG tablet, TAKE 1/2 TABLET AT BEDTIME (Patient taking differently: Take 100 mg by mouth every evening. ), Disp: 45 tablet, Rfl: 0 .  amLODipine (NORVASC) 5 MG tablet, Take one tablet by mouth one time daily, Disp: 90 tablet, Rfl: 3 .  clopidogrel (PLAVIX) 75 MG tablet, Take 1 tablet (75 mg total) by mouth daily., Disp: 30 tablet, Rfl: 5 .  Cyanocobalamin (B-12) 1000 MCG CAPS, Take 1,000 mcg by mouth every evening. , Disp: , Rfl:  .  diphenhydrAMINE (BENADRYL) 25 MG tablet, Take 25 mg by mouth at bedtime as needed for sleep., Disp: , Rfl:  .  Ferrous Gluconate-C-Folic Acid (IRON-C PO), Take 1 tablet by mouth daily., Disp: , Rfl:  .  furosemide (LASIX) 20 MG tablet, Take 1 tablet (20 mg total) by mouth daily. (Patient taking differently: Take 20 mg by mouth daily. Patient taking prn), Disp: 30 tablet, Rfl: 6 .  glimepiride (AMARYL) 4 MG tablet, Take 4 mg by mouth daily. , Disp: , Rfl:  .  insulin glargine (LANTUS) 100 UNIT/ML injection, Inject 20 Units into the skin daily. , Disp: , Rfl:  .  metFORMIN (GLUCOPHAGE) 500 MG tablet, Take 500 mg by mouth 2 (two) times daily with a meal. , Disp: , Rfl:  .  NITROSTAT 0.4 MG SL tablet,  Place 1 tablet (0.4 mg total) under the tongue every 5 (five) minutes as needed for chest pain., Disp: 25 tablet, Rfl: 1 .  NOVOLOG FLEXPEN 100 UNIT/ML FlexPen, Inject 6-36 Units into the skin 2 (two) times daily with a meal. , Disp: , Rfl:  .  omega-3 acid ethyl esters (LOVAZA) 1 g capsule, Take 1 capsule (1 g total) by mouth daily., Disp: 90 capsule, Rfl: 4 .  Omega-3 Fatty Acids (FISH OIL) 1000 MG CAPS, Take 1,000 mg by mouth every evening., Disp: , Rfl:  .  OXYGEN, Inhale 2.5 L/min into the lungs at bedtime. With CPAP, Disp: , Rfl:  .  pantoprazole (PROTONIX) 40 MG tablet, Take 1 tablet (40 mg total) by mouth daily., Disp: 90 tablet, Rfl: 3 .  Polyethylene Glycol 400 (BLINK TEARS OP), Place 1 drop into both eyes daily as needed (dry eyes)., Disp: , Rfl:  .  ramipril (ALTACE) 10 MG capsule, Take 10 mg by mouth daily. , Disp: , Rfl:  .  rosuvastatin (CRESTOR) 40 MG tablet, Take 1 tablet (40 mg total) by mouth daily., Disp: 90 tablet, Rfl: 3 .  vitamin C (ASCORBIC ACID) 500 MG tablet, Take 500 mg by mouth every evening., Disp: , Rfl:  .  warfarin (COUMADIN) 5 MG tablet, Take 2.5-5 mg by mouth See admin instructions. Take 5 mg at  night on Sun, Mon, Wed, Thur, and Fri.  Take 2.5 mg at night on Tues and Sat, Disp: , Rfl:   Past Medical History: Past Medical History:  Diagnosis Date  . Anemia 08/2015   received 2 units rbc one week ago, 3 IV iron infusion -last 1 week.  . Basal cell carcinoma of skin 2012   removed several spots from arms and back  . CAD (coronary artery disease)    a. 01/2010 CABG x 4: LIMA->LAD, VG->D1, VG->OM1, VG->PDA. b. NSTEMI 02/2012 in setting of AF-RVR with cath s/p BMS to RCA (plan for 1 month, possibly up to 3 months of Plavix)  . Carotid disease, bilateral ([place])    a. 03/2011 U/S: 40-59% bilat Carotid dzs;  b. 04/2015 Carotid U/S: 40-59% bilat ICA stenosis; c. 05/2015 CTA Neck: 11% RICA, 03% LICA, mod-marked R vertebral stenosis, mod L vertebral stenosis.  . Chronic  kidney disease    small obtusion per pcp. ultrasound done on 08/29/2015  . Diabetes mellitus type II, controlled ([place])    a. Variable CBG 02/2012 - several meds adjusted.  Marland Kitchen Dysrhythmia    intermittent Atrial Fibrillation  . GERD (gastroesophageal reflux disease)   . Heart murmur   . Hyperlipidemia   . Hypertension   . Hypertensive heart disease   . Iron deficiency anemia    "gets infusions q once in awhile" (04/27/2018)  . Macular edema bil   lazer work done previously  . Mild aortic stenosis    a. 03/2011 Echo: EF 55-60%, Mild AS. b. Mild by cath 02/2012; c. 01/2014 Echo: EF 65-70%, Gr 1 DD, mild AS, mildly dil LA.  . Multiple gastric ulcers 2006  . Myocardial infarction ([place]) 02/2012   stents (x1) at time  . Myocardial infarction [facility])    "2nd one was after 02/2012; don't know date" (04/27/2018)  . Obesity   . On home oxygen therapy    "2.5 w/CPAP" (04/27/2018)  . OSA on CPAP    CPAP settings 3 with oxygen 2.5 (04/27/2018)  . PAF (paroxysmal atrial fibrillation) ([place])    a. Newly dx 02/2012 & initiated on Coumadin (spont converted to NSR).  . Shortness of breath dyspnea   . Transfusion history    transfusions -1 month ago -2 units    Tobacco Use: Social History   Tobacco Use  Smoking Status Never Smoker  Smokeless Tobacco Never Used  Tobacco Comment   tobacco use - no.no passive smoke in home    Labs: Recent Review Flowsheet Data    Labs for ITP Cardiac and Pulmonary Rehab Latest Ref Rng & Units 02/17/2010 02/17/2010 02/17/2010 03/08/2012 03/09/2012   Cholestrol 0 - 200 mg/dL - - - - 99   LDLCALC 0 - 99 mg/dL - - - - 45   HDL >39 mg/dL - - - - 37(L)   Trlycerides <150 mg/dL - - - - 84   Hemoglobin A1c <5.7 % - - - - -   PHART 7.350 - 7.450 - 7.341(L) 7.379 - -   PCO2ART 35.0 - 45.0 mmHg - 40.6 36.7 - -   HCO3 20.0 - 24.0 mEq/L - 21.8 21.5 - -   TCO2 0 - 100 mmol/L 22 23 23 27  -   ACIDBASEDEF 0.0 - 2.0 mmol/L - 3.0(H) 3.0(H) - -   O2SAT % - 98.0 95.0 - -        Exercise Target Goals: Exercise Program Goal: Individual exercise prescription set using results from initial 6 min walk test  and THRR while considering  patient's activity barriers and safety.   Exercise Prescription Goal: Initial exercise prescription builds to 30-45 minutes a day of aerobic activity, 2-3 days per week.  Home exercise guidelines will be given to patient during program as part of exercise prescription that the participant will acknowledge.  Activity Barriers & Risk Stratification: Activity Barriers & Cardiac Risk Stratification - 05/30/18 1258      Activity Barriers & Cardiac Risk Stratification   Activity Barriers  None    Cardiac Risk Stratification  High       6 Minute Walk: 6 Minute Walk    Row Name 05/30/18 1348         6 Minute Walk   Phase  Initial     Distance  1530 feet     Walk Time  6 minutes     # of Rest Breaks  0     MPH  2.9     METS  3.48     RPE  11     VO2 Peak  12.19     Symptoms  No     Resting HR  48 bpm     Resting BP  136/64     Resting Oxygen Saturation   100 %     Exercise Oxygen Saturation  during 6 min walk  98 %     Max Ex. HR  114 bpm     Max Ex. BP  166/74     2 Minute Post BP  146/74        Oxygen Initial Assessment:   Oxygen Re-Evaluation:   Oxygen Discharge (Final Oxygen Re-Evaluation):   Initial Exercise Prescription: Initial Exercise Prescription - 05/30/18 1300      Date of Initial Exercise RX and Referring Provider   Date  05/30/18    Referring Provider  Kathlyn Sacramento MD      Treadmill   MPH  2.8    Grade  0.5    Minutes  15    METs  3.34      NuStep   Level  4    SPM  80    Minutes  15    METs  3      Recumbant Elliptical   Level  2    RPM  50    Minutes  15    METs  3      Prescription Details   Frequency (times per week)  2    Duration  Progress to 45 minutes of aerobic exercise without signs/symptoms of physical distress      Intensity   THRR 40-80% of Max Heartrate   88-128    Ratings of Perceived Exertion  11-13    Perceived Dyspnea  0-4      Progression   Progression  Continue to progress workloads to maintain intensity without signs/symptoms of physical distress.      Resistance Training   Training Prescription  Yes    Weight  4 lbs    Reps  10-15       Perform Capillary Blood Glucose checks as needed.  Exercise Prescription Changes: Exercise Prescription Changes    Row Name 05/30/18 1300 06/13/18 0900           Response to Exercise   Blood Pressure (Admit)  136/64  122/64      Blood Pressure (Exercise)  166/74  144/82      Blood Pressure (Exit)  146/74  130/60  Heart Rate (Admit)  48 bpm  47 bpm      Heart Rate (Exercise)  114 bpm  85 bpm      Heart Rate (Exit)  66 bpm  51 bpm      Oxygen Saturation (Admit)  100 %  -      Oxygen Saturation (Exercise)  98 %  -      Rating of Perceived Exertion (Exercise)  11  12      Symptoms  none  none      Comments  walk test results  -      Duration  -  Progress to 45 minutes of aerobic exercise without signs/symptoms of physical distress      Intensity  -  THRR unchanged        Progression   Progression  -  Continue to progress workloads to maintain intensity without signs/symptoms of physical distress.      Average METs  -  3.03        Resistance Training   Training Prescription  -  Yes      Weight  -  4 lbs      Reps  -  10-15        Interval Training   Interval Training  -  No        Treadmill   MPH  -  2.4      Grade  -  0.5      Minutes  -  15      METs  -  3        Recumbant Bike   Level  -  3      Watts  -  33      Minutes  -  15      METs  -  3.47        NuStep   Level  -  5      Minutes  -  15      METs  -  2.8        Home Exercise Plan   Plans to continue exercise at  -  Home (comment) walking      Frequency  -  Add 2 additional days to program exercise sessions.      Initial Home Exercises Provided  -  06/13/18         Exercise Comments: Exercise  Comments    Row Name 06/03/18 5300           Exercise Comments  First full day of exercise!  Patient was oriented to gym and equipment including functions, settings, policies, and procedures.  Patient's individual exercise prescription and treatment plan were reviewed.  All starting workloads were established based on the results of the 6 minute walk test done at initial orientation visit.  The plan for exercise progression was also introduced and progression will be customized based on patient's performance and goals.          Exercise Goals and Review: Exercise Goals    Row Name 05/30/18 1351             Exercise Goals   Increase Physical Activity  Yes       Intervention  Provide advice, education, support and counseling about physical activity/exercise needs.;Develop an individualized exercise prescription for aerobic and resistive training based on initial evaluation findings, risk stratification, comorbidities and participant's personal goals.       Expected Outcomes  Short Term: Attend rehab  on a regular basis to increase amount of physical activity.;Long Term: Add in home exercise to make exercise part of routine and to increase amount of physical activity.;Long Term: Exercising regularly at least 3-5 days a week.       Increase Strength and Stamina  Yes       Intervention  Provide advice, education, support and counseling about physical activity/exercise needs.;Develop an individualized exercise prescription for aerobic and resistive training based on initial evaluation findings, risk stratification, comorbidities and participant's personal goals.       Expected Outcomes  Short Term: Increase workloads from initial exercise prescription for resistance, speed, and METs.;Short Term: Perform resistance training exercises routinely during rehab and add in resistance training at home;Long Term: Improve cardiorespiratory fitness, muscular endurance and strength as measured by increased METs  and functional capacity (6MWT)       Able to understand and use rate of perceived exertion (RPE) scale  Yes       Intervention  Provide education and explanation on how to use RPE scale       Expected Outcomes  Short Term: Able to use RPE daily in rehab to express subjective intensity level;Long Term:  Able to use RPE to guide intensity level when exercising independently       Knowledge and understanding of Target Heart Rate Range (THRR)  Yes       Intervention  Provide education and explanation of THRR including how the numbers were predicted and where they are located for reference       Expected Outcomes  Short Term: Able to state/look up THRR;Short Term: Able to use daily as guideline for intensity in rehab;Long Term: Able to use THRR to govern intensity when exercising independently       Able to check pulse independently  Yes       Intervention  Provide education and demonstration on how to check pulse in carotid and radial arteries.;Review the importance of being able to check your own pulse for safety during independent exercise       Expected Outcomes  Short Term: Able to explain why pulse checking is important during independent exercise;Long Term: Able to check pulse independently and accurately       Understanding of Exercise Prescription  Yes       Intervention  Provide education, explanation, and written materials on patient's individual exercise prescription       Expected Outcomes  Short Term: Able to explain program exercise prescription;Long Term: Able to explain home exercise prescription to exercise independently          Exercise Goals Re-Evaluation : Exercise Goals Re-Evaluation    Row Name 06/03/18 0752 06/13/18 0908           Exercise Goal Re-Evaluation   Exercise Goals Review  Increase Physical Activity;Increase Strength and Stamina;Knowledge and understanding of Target Heart Rate Range (THRR);Able to understand and use rate of perceived exertion (RPE)  scale;Understanding of Exercise Prescription  Increase Physical Activity;Increase Strength and Stamina;Able to understand and use rate of perceived exertion (RPE) scale;Knowledge and understanding of Target Heart Rate Range (THRR);Able to check pulse independently;Understanding of Exercise Prescription      Comments  Reviewed RPE scale, THR and program prescription with pt today.  Pt voiced understanding and was given a copy of goals to take home.   Reviewed home exercise with pt today.  Pt plans to walk at home and use weights for exercise.  Reviewed THR, pulse, RPE, sign and symptoms, NTG  use, and when to call 911 or MD.  Also discussed weather considerations and indoor options.  Pt voiced understanding.      Expected Outcomes  Short: Use RPE daily to regulate intensity. Long: Follow program prescription in THR.  Short: Start to add in walking at home.  Long: Continue to exercise independently         Discharge Exercise Prescription (Final Exercise Prescription Changes): Exercise Prescription Changes - 06/13/18 0900      Response to Exercise   Blood Pressure (Admit)  122/64    Blood Pressure (Exercise)  144/82    Blood Pressure (Exit)  130/60    Heart Rate (Admit)  47 bpm    Heart Rate (Exercise)  85 bpm    Heart Rate (Exit)  51 bpm    Rating of Perceived Exertion (Exercise)  12    Symptoms  none    Duration  Progress to 45 minutes of aerobic exercise without signs/symptoms of physical distress    Intensity  THRR unchanged      Progression   Progression  Continue to progress workloads to maintain intensity without signs/symptoms of physical distress.    Average METs  3.03      Resistance Training   Training Prescription  Yes    Weight  4 lbs    Reps  10-15      Interval Training   Interval Training  No      Treadmill   MPH  2.4    Grade  0.5    Minutes  15    METs  3      Recumbant Bike   Level  3    Watts  33    Minutes  15    METs  3.47      NuStep   Level  5     Minutes  15    METs  2.8      Home Exercise Plan   Plans to continue exercise at  Home (comment)   walking   Frequency  Add 2 additional days to program exercise sessions.    Initial Home Exercises Provided  06/13/18       Nutrition:  Target Goals: Understanding of nutrition guidelines, daily intake of sodium <1575m, cholesterol <2065m calories 30% from fat and 7% or less from saturated fats, daily to have 5 or more servings of fruits and vegetables.  Biometrics: Pre Biometrics - 05/30/18 1352      Pre Biometrics   Height  5' 7"  (1.702 m)    Weight  190 lb 14.4 oz (86.6 kg)    Waist Circumference  40 inches    Hip Circumference  38 inches    Waist to Hip Ratio  1.05 %    BMI (Calculated)  29.89    Single Leg Stand  17.43 seconds        Nutrition Therapy Plan and Nutrition Goals: Nutrition Therapy & Goals - 05/30/18 1253      Intervention Plan   Intervention  Prescribe, educate and counsel regarding individualized specific dietary modifications aiming towards targeted core components such as weight, hypertension, lipid management, diabetes, heart failure and other comorbidities.;Nutrition handout(s) given to patient.    Expected Outcomes  Short Term Goal: Understand basic principles of dietary content, such as calories, fat, sodium, cholesterol and nutrients.;Short Term Goal: A plan has been developed with personal nutrition goals set during dietitian appointment.;Long Term Goal: Adherence to prescribed nutrition plan.       Nutrition Assessments:  Nutrition Assessments - 05/30/18 1253      MEDFICTS Scores   Pre Score  41       Nutrition Goals Re-Evaluation:   Nutrition Goals Discharge (Final Nutrition Goals Re-Evaluation):   Psychosocial: Target Goals: Acknowledge presence or absence of significant depression and/or stress, maximize coping skills, provide positive support system. Participant is able to verbalize types and ability to use techniques and skills  needed for reducing stress and depression.   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 He enjoys his part time jobs and his family life. He reports good sleep habits and no current stress concerns.     Family Dynamics   Good Support System?  Yes   Wife and sons     Barriers   Psychosocial barriers to participate in program  There are no identifiable barriers or psychosocial needs.;The patient should benefit from training in stress management and relaxation.      Screening Interventions   Interventions  Encouraged to exercise;Program counselor consult;To provide support and resources with identified psychosocial needs;Provide feedback about the scores to participant    Expected Outcomes  Short Term goal: Utilizing psychosocial counselor, staff and physician to assist with identification of specific Stressors or current issues interfering with healing process. Setting desired goal for each stressor or current issue identified.;Long Term Goal: Stressors or current issues are controlled or eliminated.;Short Term goal: Identification and review with participant of any Quality of Life or Depression concerns found by scoring the questionnaire.;Long Term goal: The participant improves quality of Life and PHQ9 Scores as seen by post scores and/or verbalization of changes       Quality of Life Scores:  Quality of Life - 05/30/18 1252      Quality of Life   Select  Quality of Life      Quality of Life Scores   Health/Function Pre  25.88 %    Socioeconomic Pre  30 %    Psych/Spiritual Pre  29.14 %    Family Pre  30 %    GLOBAL Pre  28.08 %      Scores of 19 and below usually indicate a poorer quality of life in these areas.  A difference of  2-3 points is a clinically meaningful difference.  A  difference of 2-3 points in the total score of the Quality of Life Index has been associated with significant improvement in overall quality of life, self-image, physical symptoms, and general health in studies assessing change in quality of life.  PHQ-9: Recent Review Flowsheet Data    Depression screen [facility] 2/9 05/30/2018   Decreased Interest 0   Down, Depressed, Hopeless 0   PHQ - 2 Score 0   Altered sleeping 0   Tired, decreased energy 0   Change in appetite 0   Feeling bad or failure about yourself  0   Trouble concentrating 0   Moving slowly or fidgety/restless 0   Suicidal thoughts 0   PHQ-9 Score 0     Interpretation of Total Score  Total Score Depression Severity:  1-4 = Minimal depression, 5-9 = Mild depression, 10-14 = Moderate depression, 15-19 = Moderately severe depression, 20-27 = Severe depression   Psychosocial Evaluation and Intervention: Psychosocial Evaluation - 06/06/18 0920  Psychosocial Evaluation & Interventions   Interventions  Encouraged to exercise with the program and follow exercise prescription    Comments  Counselor met with Mr. Omary [facility]) today for initial psychosocial evaluation.  He is a 73 year old who has a strong support system with a spouse of 20 years; (2) adult sons; a brother locally and active involvement in a local church community.  Reedy had a stent inserted approximately a month ago and is diabetic.  He is sleeping well and has a good appetite.  Sallie denies a history of depression or anxiety or any current symptoms and reports typically being in a positive mood.  He has minimal stress in his life currently - other than his health conditions.  Chipper has goals to lose weight and increase his stamina and strength while in this program.  [facility].  Staff will monitor [place] throughout the course of this program.      Expected Outcomes  Short:   Cowen will meet with the dietician to address his weight  loss goal.  He will also exercise consistently to increase his stamina and strength.  Long:  Marselino will maintain healthy life habits; including exercise and diet for his well-being.  [facility] will [facility] to [facility] these habits.    Continue Psychosocial Services   Follow up required by staff       Psychosocial Re-Evaluation:   Psychosocial Discharge (Final Psychosocial Re-Evaluation):   Vocational Rehabilitation: Provide vocational rehab assistance to qualifying candidates.   Vocational Rehab Evaluation & Intervention: Vocational Rehab - 05/30/18 1251      Initial Vocational Rehab Evaluation & Intervention   Assessment shows need for Vocational Rehabilitation  No       Education: Education Goals: Education classes will be provided on a variety of topics geared toward better understanding of heart health and risk factor modification. Participant will state understanding/return demonstration of topics presented as noted by education test scores.  Learning Barriers/Preferences: Learning Barriers/Preferences - 05/30/18 1250      Learning Barriers/Preferences   Learning Barriers  None    Learning Preferences  Individual Instruction       Education Topics:  AED/CPR: - Group verbal and written instruction with the use of models to demonstrate the basic use of the AED with the basic ABC's of resuscitation.   General Nutrition Guidelines/Fats and Fiber: -Group instruction provided by verbal, written material, models and posters to present the general guidelines for heart healthy nutrition. Gives an explanation and review of dietary fats and fiber.   Cardiac Rehab from 06/20/2018 in [facility] Cardiac and Pulmonary Rehab  Date  06/06/18  Educator  LB  Instruction Review Code  1- Verbalizes Understanding      Controlling Sodium/Reading Food Labels: -Group verbal and written material supporting the discussion of sodium use in heart healthy nutrition. Review and explanation with models,  verbal and written materials for utilization of the food label.   Exercise Physiology & General Exercise Guidelines: - Group verbal and written instruction with models to review the exercise physiology of the cardiovascular system and associated critical values. Provides general exercise guidelines with specific guidelines to those with heart or lung disease.    Cardiac Rehab from 06/20/2018 in [facility] Cardiac and Pulmonary Rehab  Date  06/13/18  Educator  [facility]  Instruction Review Code  1- Verbalizes Understanding      Aerobic Exercise & Resistance Training: - Gives group verbal and written  instruction on the various components of exercise. Focuses on aerobic and resistive training programs and the benefits of this training and how to safely progress through these programs..   Flexibility, Balance, Mind/Body Relaxation: Provides group verbal/written instruction on the benefits of flexibility and balance training, including mind/body exercise modes such as yoga, pilates and tai chi.  Demonstration and skill practice provided.   Cardiac Rehab from 06/20/2018 in [facility] Cardiac and Pulmonary Rehab  Date  06/20/18  Educator  [facility]  Instruction Review Code  1- Verbalizes Understanding      Stress and Anxiety: - Provides group verbal and written instruction about the health risks of elevated stress and causes of high stress.  Discuss the correlation between heart/lung disease and anxiety and treatment options. Review healthy ways to manage with stress and anxiety.   Depression: - Provides group verbal and written instruction on the correlation between heart/lung disease and depressed mood, treatment options, and the stigmas associated with seeking treatment.   Anatomy & Physiology of the Heart: - Group verbal and written instruction and models provide basic cardiac anatomy and physiology, with the coronary electrical and arterial systems. Review of Valvular disease and Heart Failure   Cardiac  Procedures: - Group verbal and written instruction to review commonly prescribed medications for heart disease. Reviews the medication, class of the drug, and side effects. Includes the steps to properly store meds and maintain the prescription regimen. (beta blockers and nitrates)   Cardiac Medications I: - Group verbal and written instruction to review commonly prescribed medications for heart disease. Reviews the medication, class of the drug, and side effects. Includes the steps to properly store meds and maintain the prescription regimen.   Cardiac Medications II: -Group verbal and written instruction to review commonly prescribed medications for heart disease. Reviews the medication, class of the drug, and side effects. (all other drug classes)    Go Sex-Intimacy & Heart Disease, Get SMART - Goal Setting: - Group verbal and written instruction through game format to discuss heart disease and the return to sexual intimacy. Provides group verbal and written material to discuss and apply goal setting through the application of the S.M.A.R.T. Method.   Other Matters of the Heart: - Provides group verbal, written materials and models to describe Stable Angina and Peripheral Artery. Includes description of the disease process and treatment options available to the cardiac patient.   Exercise & Equipment Safety: - Individual verbal instruction and demonstration of equipment use and safety with use of the equipment.   Cardiac Rehab from 06/20/2018 in [facility] Cardiac and Pulmonary Rehab  Date  05/30/18  Educator  [facility]  Instruction Review Code  1- Verbalizes Understanding      Infection Prevention: - Provides verbal and written material to individual with discussion of infection control including proper hand washing and proper equipment cleaning during exercise session.   Cardiac Rehab from 06/20/2018 in [facility] Cardiac and Pulmonary Rehab  Date  05/30/18  Educator  [facility]  Instruction Review Code   1- Verbalizes Understanding      Falls Prevention: - Provides verbal and written material to individual with discussion of falls prevention and safety.   Cardiac Rehab from 06/20/2018 in [facility] Cardiac and Pulmonary Rehab  Date  05/30/18  Educator  [facility]  Instruction Review Code  1- Verbalizes Understanding      Diabetes: - Individual verbal and written instruction to review signs/symptoms of diabetes, desired ranges of glucose level fasting, after meals and with exercise. Acknowledge that pre and  post exercise glucose checks will be done for 3 sessions at entry of program.   Cardiac Rehab from 06/20/2018 in [facility] Cardiac and Pulmonary Rehab  Date  05/30/18  Educator  [facility]  Instruction Review Code  1- Verbalizes Understanding      Know Your Numbers and Risk Factors: -Group verbal and written instruction about important numbers in your health.  Discussion of what are risk factors and how they play a role in the disease process.  Review of Cholesterol, Blood Pressure, Diabetes, and BMI and the role they play in your overall health.   Sleep Hygiene: -Provides group verbal and written instruction about how sleep can affect your health.  Define sleep hygiene, discuss sleep cycles and impact of sleep habits. Review good sleep hygiene tips.    Other: -Provides group and verbal instruction on various topics (see comments)   Knowledge Questionnaire Score: Knowledge Questionnaire Score - 05/30/18 1250      Knowledge Questionnaire Score   Pre Score  23/26   correct answers reviewed wtih Elta [place]. Focus on anatomy and exercise      Core Components/Risk Factors/Patient Goals at Admission: Personal Goals and Risk Factors at Admission - 05/30/18 1239      Core Components/Risk Factors/Patient Goals on Admission    Weight Management  Yes;Weight Loss    Intervention  Weight Management: Develop a combined nutrition and exercise program designed to reach desired caloric intake, while maintaining  appropriate intake of nutrient and fiber, sodium and fats, and appropriate energy expenditure required for the weight goal.;Weight Management: Provide education and appropriate resources to help participant work on and attain dietary goals.;Weight Management/Obesity: Establish reasonable short term and long term weight goals.    Admit Weight  190 lb 14.4 oz (86.6 kg)    Goal Weight: Short Term  185 lb (83.9 kg)    Goal Weight: Long Term  180 lb (81.6 kg)    Expected Outcomes  Short Term: Continue to assess and modify interventions until short term weight is achieved;Long Term: Adherence to nutrition and physical activity/exercise program aimed toward attainment of established weight goal;Weight Loss: Understanding of general recommendations for a balanced deficit meal plan, which promotes 1-2 lb weight loss per week and includes a negative energy balance of [phone] kcal/d;Understanding recommendations for meals to include 15-35% energy as protein, 25-35% energy from fat, 35-60% energy from carbohydrates, less than 244m of dietary cholesterol, 20-35 gm of total fiber daily;Understanding of distribution of calorie intake throughout the day with the consumption of 4-5 meals/snacks    Diabetes  Yes    Intervention  Provide education about signs/symptoms and action to take for hypo/hyperglycemia.;Provide education about proper nutrition, including hydration, and aerobic/resistive exercise prescription along with prescribed medications to achieve blood glucose in normal ranges: Fasting glucose 65-99 mg/dL    Expected Outcomes  Short Term: Participant verbalizes understanding of the signs/symptoms and immediate care of hyper/hypoglycemia, proper foot care and importance of medication, aerobic/resistive exercise and nutrition plan for blood glucose control.;Long Term: Attainment of HbA1C < 7%.    Hypertension  Yes    Intervention  Provide education on lifestyle modifcations including regular physical  activity/exercise, weight management, moderate sodium restriction and increased consumption of fresh fruit, vegetables, and low fat dairy, alcohol moderation, and smoking cessation.;Monitor prescription use compliance.    Expected Outcomes  Short Term: Continued assessment and intervention until BP is < 140/932mHG in hypertensive participants. < 130/8036mG in hypertensive participants with diabetes, heart failure or chronic kidney disease.;Long Term:  Maintenance of blood pressure at goal levels.    Lipids  Yes    Intervention  Provide education and support for participant on nutrition & aerobic/resistive exercise along with prescribed medications to achieve LDL <62m, HDL >481m    Expected Outcomes  Short Term: Participant states understanding of desired cholesterol values and is compliant with medications prescribed. Participant is following exercise prescription and nutrition guidelines.;Long Term: Cholesterol controlled with medications as prescribed, with individualized exercise RX and with personalized nutrition plan. Value goals: LDL < 7044mHDL > 40 mg.       Core Components/Risk Factors/Patient Goals Review:    Core Components/Risk Factors/Patient Goals at Discharge (Final Review):    ITP Comments: ITP Comments    Row Name 05/30/18 1221 06/22/18 0928         ITP Comments  Med Review completed. Initial ITP created. [facility]  [facility] Day Review. Continue with ITP unless directed changes per Medical Director review.         Comments: 30 day review

## 2018-06-24 ENCOUNTER — Encounter: Payer: PPO | Admitting: *Deleted

## 2018-06-24 DIAGNOSIS — Z955 Presence of coronary angioplasty implant and graft: Secondary | ICD-10-CM | POA: Diagnosis not present

## 2018-06-24 LAB — GLUCOSE, CAPILLARY: Glucose-Capillary: 107 mg/dL — ABNORMAL HIGH (ref 70–99)

## 2018-06-24 NOTE — Progress Notes (Signed)
Daily Session Note  Patient Details  Name: Seth Hardy MRN: [id number] Date of Birth: June 08, 1945 Referring Provider:     Cardiac Rehab from 05/30/2018 in [facility] Cardiac and Pulmonary Rehab  Referring Provider  Kathlyn Sacramento MD      Encounter Date: 06/24/2018  Check In: Session Check In - 06/24/18 0744      Check-In   Supervising physician immediately available to respond to emergencies  See telemetry face sheet for immediately available ER MD    Location  [facility]-Cardiac & Pulmonary Rehab    Staff Present  Renita Papa, RN BSN;Lucill Mauck Luan Pulling, MA, RCEP, CCRP, Exercise Physiologist;Amanda Oletta Darter, [place], ACSM CEP, Exercise Physiologist    Medication changes reported      No    Fall or balance concerns reported     No    Warm-up and Cool-down  Performed as group-led instruction    Resistance Training Performed  Yes    VAD Patient?  No    PAD/SET Patient?  No      Pain Assessment   Currently in Pain?  No/denies          Social History   Tobacco Use  Smoking Status Never Smoker  Smokeless Tobacco Never Used  Tobacco Comment   tobacco use - no.no passive smoke in home    Goals Met:  Independence with exercise equipment Exercise tolerated well No report of cardiac concerns or symptoms Strength training completed today  Goals Unmet:  Not Applicable  Comments: Pt able to follow exercise prescription today without complaint.  Will continue to monitor for progression.    Dr. Emily Filbert is Medical Director for [place] [facility].

## 2018-06-27 ENCOUNTER — Ambulatory Visit (INDEPENDENT_AMBULATORY_CARE_PROVIDER_SITE_OTHER): Payer: PPO

## 2018-06-27 ENCOUNTER — Encounter: Payer: PPO | Attending: Cardiovascular Disease | Admitting: *Deleted

## 2018-06-27 DIAGNOSIS — I4891 Unspecified atrial fibrillation: Secondary | ICD-10-CM | POA: Diagnosis not present

## 2018-06-27 DIAGNOSIS — Z955 Presence of coronary angioplasty implant and graft: Secondary | ICD-10-CM | POA: Diagnosis not present

## 2018-06-27 DIAGNOSIS — Z7901 Long term (current) use of anticoagulants: Secondary | ICD-10-CM

## 2018-06-27 LAB — POCT INR: INR: 3.7 — AB (ref 2.0–3.0)

## 2018-06-27 NOTE — Progress Notes (Signed)
Daily Session Note  Patient Details  Name: Seth Hardy MRN: [id number] Date of Birth: Jul 13, 1945 Referring Provider:     Cardiac Rehab from 05/30/2018 in [facility] Cardiac and Pulmonary Rehab  Referring Provider  Kathlyn Sacramento MD      Encounter Date: 06/27/2018  Check In: Session Check In - 06/27/18 0755      Check-In   Supervising physician immediately available to respond to emergencies  See telemetry face sheet for immediately available ER MD    Location  [facility]-Cardiac & Pulmonary Rehab    Staff Present  Heath Lark, RN, BSN, CCRP;Agustina Witzke [place], MA, RCEP, CCRP, Exercise Physiologist;Kelly Amedeo Plenty, BS, ACSM CEP, Exercise Physiologist    Medication changes reported      No    Fall or balance concerns reported     No    Warm-up and Cool-down  Performed as group-led instruction    Resistance Training Performed  Yes    VAD Patient?  No    PAD/SET Patient?  No      Pain Assessment   Currently in Pain?  No/denies          Social History   Tobacco Use  Smoking Status Never Smoker  Smokeless Tobacco Never Used  Tobacco Comment   tobacco use - no.no passive smoke in home    Goals Met:  Independence with exercise equipment Exercise tolerated well No report of cardiac concerns or symptoms Strength training completed today  Goals Unmet:  Not Applicable  Comments: Pt able to follow exercise prescription today without complaint.  Will continue to monitor for progression.    Dr. Emily Filbert is Medical Director for [place] and [facility].

## 2018-06-27 NOTE — Patient Instructions (Signed)
Please skip coumadin tonight & tomorrow, then continue dosage 1 tablet daily except 1/2 tablet on Tuesdays and Saturdays.   Be consistent w/ your greens intake - pick day(s) each week to have your greens and have them every week on that day(s). Recheck INR in 3 weeks.

## 2018-07-01 ENCOUNTER — Encounter: Payer: PPO | Admitting: *Deleted

## 2018-07-01 ENCOUNTER — Inpatient Hospital Stay: Payer: PPO

## 2018-07-01 ENCOUNTER — Inpatient Hospital Stay: Payer: PPO | Admitting: Internal Medicine

## 2018-07-01 DIAGNOSIS — Z955 Presence of coronary angioplasty implant and graft: Secondary | ICD-10-CM

## 2018-07-01 NOTE — Progress Notes (Signed)
Daily Session Note  Patient Details  Name: CARSTON RIEDL MRN: [id number] Date of Birth: 07/25/1945 Referring Provider:     Cardiac Rehab from 05/30/2018 in [facility] Cardiac and Pulmonary Rehab  Referring Provider  Kathlyn Sacramento MD      Encounter Date: 07/01/2018  Check In: Session Check In - 07/01/18 0749      Check-In   Supervising physician immediately available to respond to emergencies  See telemetry face sheet for immediately available ER MD    Location  [facility]-Cardiac & Pulmonary Rehab    Staff Present  Renita Papa, RN BSN;Hagar Sadiq Luan Pulling, MA, RCEP, CCRP, Exercise Physiologist;Amanda Oletta Darter, [place], ACSM CEP, Exercise Physiologist    Medication changes reported      No    Fall or balance concerns reported     No    Warm-up and Cool-down  Performed on first and last piece of equipment    Resistance Training Performed  No   late arrival   VAD Patient?  No    PAD/SET Patient?  No      Pain Assessment   Currently in Pain?  No/denies          Social History   Tobacco Use  Smoking Status Never Smoker  Smokeless Tobacco Never Used  Tobacco Comment   tobacco use - no.no passive smoke in home    Goals Met:  Independence with exercise equipment Exercise tolerated well No report of cardiac concerns or symptoms Strength training completed today  Goals Unmet:  Not Applicable  Comments: Pt able to follow exercise prescription today without complaint.  Will continue to monitor for progression.    Dr. Emily Filbert is Medical Director for [place] and [facility].

## 2018-07-08 ENCOUNTER — Ambulatory Visit: Payer: PPO | Admitting: Internal Medicine

## 2018-07-08 ENCOUNTER — Ambulatory Visit: Payer: PPO

## 2018-07-08 ENCOUNTER — Other Ambulatory Visit: Payer: PPO

## 2018-07-12 ENCOUNTER — Inpatient Hospital Stay (HOSPITAL_BASED_OUTPATIENT_CLINIC_OR_DEPARTMENT_OTHER): Payer: PPO | Admitting: Internal Medicine

## 2018-07-12 ENCOUNTER — Telehealth: Payer: Self-pay | Admitting: Internal Medicine

## 2018-07-12 ENCOUNTER — Inpatient Hospital Stay: Payer: PPO

## 2018-07-12 ENCOUNTER — Inpatient Hospital Stay: Payer: PPO | Attending: Internal Medicine

## 2018-07-12 VITALS — BP 165/67 | HR 52 | Temp 96.8°F | Resp 20 | Ht 67.0 in | Wt 192.0 lb

## 2018-07-12 DIAGNOSIS — I350 Nonrheumatic aortic (valve) stenosis: Secondary | ICD-10-CM | POA: Insufficient documentation

## 2018-07-12 DIAGNOSIS — Z7902 Long term (current) use of antithrombotics/antiplatelets: Secondary | ICD-10-CM | POA: Diagnosis not present

## 2018-07-12 DIAGNOSIS — Z7901 Long term (current) use of anticoagulants: Secondary | ICD-10-CM | POA: Diagnosis not present

## 2018-07-12 DIAGNOSIS — G4700 Insomnia, unspecified: Secondary | ICD-10-CM | POA: Insufficient documentation

## 2018-07-12 DIAGNOSIS — Z794 Long term (current) use of insulin: Secondary | ICD-10-CM | POA: Insufficient documentation

## 2018-07-12 DIAGNOSIS — Z801 Family history of malignant neoplasm of trachea, bronchus and lung: Secondary | ICD-10-CM

## 2018-07-12 DIAGNOSIS — E669 Obesity, unspecified: Secondary | ICD-10-CM

## 2018-07-12 DIAGNOSIS — H903 Sensorineural hearing loss, bilateral: Secondary | ICD-10-CM | POA: Diagnosis not present

## 2018-07-12 DIAGNOSIS — Z85828 Personal history of other malignant neoplasm of skin: Secondary | ICD-10-CM | POA: Diagnosis not present

## 2018-07-12 DIAGNOSIS — E785 Hyperlipidemia, unspecified: Secondary | ICD-10-CM | POA: Insufficient documentation

## 2018-07-12 DIAGNOSIS — D500 Iron deficiency anemia secondary to blood loss (chronic): Secondary | ICD-10-CM | POA: Diagnosis not present

## 2018-07-12 DIAGNOSIS — I4891 Unspecified atrial fibrillation: Secondary | ICD-10-CM

## 2018-07-12 DIAGNOSIS — G4733 Obstructive sleep apnea (adult) (pediatric): Secondary | ICD-10-CM | POA: Diagnosis not present

## 2018-07-12 DIAGNOSIS — E119 Type 2 diabetes mellitus without complications: Secondary | ICD-10-CM | POA: Diagnosis not present

## 2018-07-12 DIAGNOSIS — I2510 Atherosclerotic heart disease of native coronary artery without angina pectoris: Secondary | ICD-10-CM | POA: Diagnosis not present

## 2018-07-12 DIAGNOSIS — Z79899 Other long term (current) drug therapy: Secondary | ICD-10-CM | POA: Insufficient documentation

## 2018-07-12 DIAGNOSIS — Z7982 Long term (current) use of aspirin: Secondary | ICD-10-CM

## 2018-07-12 DIAGNOSIS — R0602 Shortness of breath: Secondary | ICD-10-CM | POA: Insufficient documentation

## 2018-07-12 DIAGNOSIS — H6123 Impacted cerumen, bilateral: Secondary | ICD-10-CM | POA: Diagnosis not present

## 2018-07-12 DIAGNOSIS — K219 Gastro-esophageal reflux disease without esophagitis: Secondary | ICD-10-CM | POA: Insufficient documentation

## 2018-07-12 DIAGNOSIS — Z809 Family history of malignant neoplasm, unspecified: Secondary | ICD-10-CM | POA: Insufficient documentation

## 2018-07-12 DIAGNOSIS — Z800 Family history of malignant neoplasm of digestive organs: Secondary | ICD-10-CM | POA: Insufficient documentation

## 2018-07-12 DIAGNOSIS — I252 Old myocardial infarction: Secondary | ICD-10-CM

## 2018-07-12 DIAGNOSIS — Z8052 Family history of malignant neoplasm of bladder: Secondary | ICD-10-CM | POA: Diagnosis not present

## 2018-07-12 DIAGNOSIS — I480 Paroxysmal atrial fibrillation: Secondary | ICD-10-CM | POA: Diagnosis not present

## 2018-07-12 DIAGNOSIS — I129 Hypertensive chronic kidney disease with stage 1 through stage 4 chronic kidney disease, or unspecified chronic kidney disease: Secondary | ICD-10-CM | POA: Insufficient documentation

## 2018-07-12 DIAGNOSIS — Z8673 Personal history of transient ischemic attack (TIA), and cerebral infarction without residual deficits: Secondary | ICD-10-CM

## 2018-07-12 LAB — BASIC METABOLIC PANEL
Anion gap: 8 (ref 5–15)
BUN: 18 mg/dL (ref 8–23)
CO2: 24 mmol/L (ref 22–32)
Calcium: 8.6 mg/dL — ABNORMAL LOW (ref 8.9–10.3)
Chloride: 105 mmol/L (ref 98–111)
Creatinine, Ser: 1.22 mg/dL (ref 0.61–1.24)
GFR calc Af Amer: >60 mL/min (ref >60)
GFR calc non Af Amer: 59 mL/min — ABNORMAL LOW (ref >60)
Glucose, Bld: 283 mg/dL — ABNORMAL HIGH (ref 70–99)
Potassium: 4.1 mmol/L (ref 3.5–5.1)
Sodium: 137 mmol/L (ref 135–145)

## 2018-07-12 LAB — CBC WITH DIFFERENTIAL/PLATELET
Abs Immature Granulocytes: 0 10*3/uL (ref 0.00–0.07)
Basophils Absolute: 0 10*3/uL (ref 0.0–0.1)
Basophils Relative: 0 %
Eosinophils Absolute: 0.1 10*3/uL (ref 0.0–0.5)
Eosinophils Relative: 3 %
HCT: 34.1 % — ABNORMAL LOW (ref 39.0–52.0)
Hemoglobin: 10.8 g/dL — ABNORMAL LOW (ref 13.0–17.0)
Immature Granulocytes: 0 %
Lymphocytes Relative: 23 %
Lymphs Abs: 0.7 10*3/uL (ref 0.7–4.0)
MCH: 30.3 pg (ref 26.0–34.0)
MCHC: 31.7 g/dL (ref 30.0–36.0)
MCV: 95.8 fL (ref 80.0–100.0)
Monocytes Absolute: 0.4 10*3/uL (ref 0.1–1.0)
Monocytes Relative: 12 %
Neutro Abs: 1.9 10*3/uL (ref 1.7–7.7)
Neutrophils Relative %: 62 %
Platelets: 169 10*3/uL (ref 150–400)
RBC: 3.56 MIL/uL — ABNORMAL LOW (ref 4.22–5.81)
RDW: 13.4 % (ref 11.5–15.5)
WBC: 3.2 10*3/uL — ABNORMAL LOW (ref 4.0–10.5)
nRBC: 0 % (ref 0.0–0.2)

## 2018-07-12 LAB — IRON AND TIBC
Iron: 40 ug/dL — ABNORMAL LOW (ref 45–182)
Saturation Ratios: 10 % — ABNORMAL LOW (ref 17.9–39.5)
TIBC: 393 ug/dL (ref 250–450)
UIBC: 353 ug/dL

## 2018-07-12 LAB — FERRITIN: Ferritin: 11 ng/mL — ABNORMAL LOW (ref 24–336)

## 2018-07-12 NOTE — Progress Notes (Signed)
Seth Hardy  Patient Care Team: Seth Aus, MD as PCP - General (Internal Medicine)   SUMMARY OF ONCOLOGIC HISTORY:  # MARCH 2017- April 2017- IRON DEFICIENCY ANEMIA ? Etiology s/p IV iron [EGD- ?June 2017- Bleeding gastric "polyp"/ colo- Dr.Gessner]; recommend CAPSULE STUDY [on HOLD sec to ?plavix]  # hx of A.fib- on coumadin-HOLD April 2017/ CAD [Dr.[name]]; Hx of gastric ulcer [Dr.Gessner];   TIA/ CEA [s/p April 2017]; OSA on CPAP.   History of present illness:   73 -year-old Caucasian male patient is here for follow-up of his severe iron deficiency anemia-;also on Coumadin for A. fib is here for follow-up. Patient is currently taking iron pills once a day.  Patient admits to mild fatigue.  Mild shortness of breath on exertion.  Otherwise no new shortness of breath or cough.  No blood in stools.   Review of Systems  Constitutional: Positive for malaise/fatigue. Negative for chills, diaphoresis and fever.  HENT: Negative for nosebleeds and sore throat.   Eyes: Negative for double vision.  Respiratory: Positive for shortness of breath. Negative for cough, hemoptysis, sputum production and wheezing.   Cardiovascular: Negative for chest pain, palpitations, orthopnea and leg swelling.  Gastrointestinal: Negative for abdominal pain, blood in stool, constipation, diarrhea, heartburn, melena, nausea and vomiting.  Genitourinary: Negative for dysuria, frequency and urgency.  Musculoskeletal: Negative for back pain and joint pain.  Skin: Negative.  Negative for itching and rash.  Neurological: Negative for dizziness, tingling, focal weakness, weakness and headaches.  Endo/Heme/Allergies: Does not bruise/bleed easily.  Psychiatric/Behavioral: Negative for depression. The patient is not nervous/anxious and does not have insomnia.      PAST MEDICAL HISTORY :  Past Medical History  Diagnosis Date  . Hypertensive heart disease   . Hyperlipidemia   .  Diabetes mellitus type II, controlled ([place])     a. Variable CBG 02/2012 - several meds adjusted.  . Mild aortic stenosis     a. 03/2011 Echo: EF 55-60%, Mild AS. b. Mild by cath 02/2012; c. 01/2014 Echo: EF 65-70%, Gr 1 DD, mild AS, mildly dil LA.  . Obesity   . GERD (gastroesophageal reflux disease)   . PAF (paroxysmal atrial fibrillation) ([place])     a. Newly dx 02/2012 & initiated on Coumadin (spont converted to NSR).  Marland Kitchen CAD (coronary artery disease)     a. 01/2010 CABG x 4: LIMA->LAD, VG->D1, VG->OM1, VG->PDA. b. NSTEMI 02/2012 in setting of AF-RVR with cath s/p BMS to RCA (plan for 1 month, possibly up to 3 months of Plavix)  . Carotid disease, bilateral ([place])     a. 03/2011 U/S: 40-59% bilat Carotid dzs;  b. 04/2015 Carotid U/S: 40-59% bilat ICA stenosis; c. 05/2015 CTA Neck: 85% RICA, 02% LICA, mod-marked R vertebral stenosis, mod L vertebral stenosis.  . Myocardial infarction ([place]) 2014  . Hypertension   . Shortness of breath dyspnea   . Sleep apnea     CPAP with oxygen  . Chronic kidney disease   . Basal cell carcinoma of skin   . Anemia   . Macular edema bil    PAST SURGICAL HISTORY :   Past Surgical History  Procedure Laterality Date  . Cyst l kidney  [place]  . Angioplasty    . Cardiac catheterization    . Left heart catheterization with coronary angiogram N/A 03/09/2012    Procedure: LEFT HEART CATHETERIZATION WITH CORONARY ANGIOGRAM;  Surgeon: Wellington Hampshire, MD;  Location: [place] CATH LAB;  Service: Cardiovascular;  Laterality: N/A;  . Percutaneous coronary stent intervention (pci-s)  03/09/2012    Procedure: PERCUTANEOUS CORONARY STENT INTERVENTION (PCI-S);  Surgeon: Wellington Hampshire, MD;  Location: [facility] CATH LAB;  Service: Cardiovascular;;  . Coronary artery bypass graft  09-15-2009    [facility] [place], [place],   . Coronary angioplasty      Dr. Fletcher Anon  . Cataract extraction Bilateral     Cataract Extraction with IOL  . Inguinal hernia repair Right     Right  Inguinal Hernia Repair    FAMILY HISTORY :   Family History  Problem Relation Age of Onset  . COPD Mother     alive 57  . Heart attack Father     09-15-13 deceased  . Bladder Cancer Mother   . Lung cancer Maternal Aunt   . Liver cancer Maternal Aunt   . Kidney disease Paternal Uncle   . Cancer Other     all paternal aunts and uncles    SOCIAL HISTORY:   Social History  Substance Use Topics  . Smoking status: Never Smoker   . Smokeless tobacco: Never Used     Comment: tobacco use - no  . Alcohol Use: Yes     Comment: rare drink    ALLERGIES:  has No Known Allergies.  MEDICATIONS:  Current Outpatient Prescriptions  Medication Sig Dispense Refill  . acetaminophen (TYLENOL) 325 MG tablet Take 650 mg by mouth every 6 (six) hours as needed. For pain    . amiodarone (PACERONE) 200 MG tablet Take 0.5 tablets (100 mg total) by mouth daily. (Patient taking differently: Take 100 mg by mouth at bedtime. ) 45 tablet 3  . amLODipine (NORVASC) 5 MG tablet Take one tablet by mouth one time daily 90 tablet 3  . aspirin 81 MG EC tablet Take 81 mg by mouth daily.      Marland Kitchen atorvastatin (LIPITOR) 20 MG tablet Take 1 tablet (20 mg total) by mouth at bedtime. 90 tablet 3  . furosemide (LASIX) 20 MG tablet Take 1 tablet (20 mg total) by mouth as needed. 30 tablet 6  . glimepiride (AMARYL) 4 MG tablet Take 4 mg by mouth 2 (two) times daily.      . insulin glargine (LANTUS) 100 UNIT/ML injection Inject 20 Units into the skin daily.     . insulin lispro (HUMALOG) 100 UNIT/ML injection Inject 8 Units into the skin 2 (two) times daily with a meal. (Patient taking differently: Inject into the skin 3 (three) times daily. Per sliding scale.)    . metFORMIN (GLUCOPHAGE) 500 MG tablet Take 500 mg by mouth 2 (two) times daily with a meal.    . nitroGLYCERIN (NITROSTAT) 0.4 MG SL tablet Place 1 tablet (0.4 mg total) under the tongue every 5 (five) minutes as needed (up to 3 doses). 25 tablet 1  . OXYGEN Inhale 2.5  L/min into the lungs at bedtime. With CPAP    . pantoprazole (PROTONIX) 40 MG tablet Take 1 tablet (40 mg total) by mouth daily. (Patient taking differently: Take 40 mg by mouth 2 (two) times daily. ) 90 tablet 3  . ramipril (ALTACE) 10 MG capsule Take 1 capsule (10 mg total) by mouth daily. 90 capsule 5  . warfarin (COUMADIN) 5 MG tablet Take as directed by anticoagulation clinic 90 tablet 1  . IRON-VITAMIN C PO Take 1 tablet by mouth daily. Reported on 08/27/2015    . Omega-3 Fatty Acids (FISH OIL) 1200  MG CAPS Take 1 capsule by mouth daily. Reported on 08/27/2015     No current facility-administered medications for this visit.    PHYSICAL EXAMINATION:   BP 158/71 mmHg  Pulse 51  Temp(Src) 96.1 F (35.6 C)  Resp 18  Wt 192 lb 0.3 oz (87.1 kg)  Filed Weights   08/27/15 1015  Weight: 192 lb 0.3 oz (87.1 kg)    Physical Exam  Constitutional: He is oriented to person, place, and time and well-developed, well-nourished, and in no distress.  Accompanied by his wife.  HENT:  Head: Normocephalic and atraumatic.  Mouth/Throat: Oropharynx is clear and moist. No oropharyngeal exudate.  Eyes: Pupils are equal, round, and reactive to light.  Neck: Normal range of motion. Neck supple.  Cardiovascular: Normal rate and regular rhythm.  Pulmonary/Chest: No respiratory distress. He has no wheezes.  Abdominal: Soft. Bowel sounds are normal. He exhibits no distension and no mass. There is no abdominal tenderness. There is no rebound and no guarding.  Musculoskeletal: Normal range of motion.        General: No tenderness or edema.  Neurological: He is alert and oriented to person, place, and time.  Skin: Skin is warm. There is pallor.  Psychiatric: Affect normal.     LABORATORY DATA:  I have reviewed the data as listed    Component Value Date/Time   NA 139 07/25/2015 1138   NA 139 01/22/2013 0348   K 4.1 07/25/2015 1138   K 4.2 01/22/2013 0348   CL 103 07/25/2015 1138   CL 106  01/22/2013 0348   CO2 27 07/25/2015 1138   CO2 28 01/22/2013 0348   GLUCOSE 216* 07/25/2015 1138   GLUCOSE 158* 01/22/2013 0348   BUN 14 07/25/2015 1138   BUN 16 01/22/2013 0348   CREATININE 1.03 07/25/2015 1138   CREATININE 1.02 01/22/2013 0348   CALCIUM 9.1 07/25/2015 1138   CALCIUM 8.6 01/22/2013 0348   PROT 7.2 06/25/2015 1045   PROT 7.5 01/21/2013 0209   ALBUMIN 4.1 06/25/2015 1045   ALBUMIN 4.3 01/21/2013 0209   AST 30 06/25/2015 1045   AST 28 01/21/2013 0209   ALT 33 06/25/2015 1045   ALT 36 01/21/2013 0209   ALKPHOS 104 06/25/2015 1045   ALKPHOS 143* 01/21/2013 0209   BILITOT 0.4 06/25/2015 1045   BILITOT 0.4 01/21/2013 0209   GFRNONAA >60 07/25/2015 1138   GFRNONAA >60 01/22/2013 0348   GFRAA >60 07/25/2015 1138   GFRAA >60 01/22/2013 0348    No results found for: SPEP, UPEP  Lab Results  Component Value Date   WBC 4.5 08/22/2015   NEUTROABS 2.5 08/22/2015   HGB 8.0 Repeated and verified X2.* 08/22/2015   HCT 26.2 Repeated and verified X2.* 08/22/2015   MCV 67.4 Repeated and verified X2.* 08/22/2015   PLT 264.0 08/22/2015      Chemistry      Component Value Date/Time   NA 139 07/25/2015 1138   NA 139 01/22/2013 0348   K 4.1 07/25/2015 1138   K 4.2 01/22/2013 0348   CL 103 07/25/2015 1138   CL 106 01/22/2013 0348   CO2 27 07/25/2015 1138   CO2 28 01/22/2013 0348   BUN 14 07/25/2015 1138   BUN 16 01/22/2013 0348   CREATININE 1.03 07/25/2015 1138   CREATININE 1.02 01/22/2013 0348      Component Value Date/Time   CALCIUM 9.1 07/25/2015 1138   CALCIUM 8.6 01/22/2013 0348   ALKPHOS 104 06/25/2015 1045   ALKPHOS 143*  01/21/2013 0209   AST 30 06/25/2015 1045   AST 28 01/21/2013 0209   ALT 33 06/25/2015 1045   ALT 36 01/21/2013 0209   BILITOT 0.4 06/25/2015 1045   BILITOT 0.4 01/21/2013 0209        ASSESSMENT & PLAN:   Iron deficiency anemia due to chronic blood loss #Anemia iron deficient-likely secondary to gastric losses versus others.   Hemoglobin is 10.8; improved; stable on PO iron. Await capsule study with Dr.Mann [will need to be off plavix].  Rectum with IV iron.  Await iron studies from today.  If low would recommend Feraheme.  # Leucopenia/ neutropenia-intermittent. 3.2 today;ANC 1.9; hold off bmbx.   # CAD s/p stenting- on asprin+ plavix x6 months; A.fib- on Coumadin.   #Hypertension: Blood pressures 209 systolic.  Recommend continued follow-up with cardiology.  #History of OSA-CPAP/Oxygen- STABLE.  # DISPOSITION:  # HOLD off IV ferrahem today; will call if feritin is low.  # follow up in 4 months/cbc;possible IV ferrahem- Dr.B

## 2018-07-12 NOTE — Telephone Encounter (Signed)
Heather/Brooke please inform patient that his iron numbers are low-saturation is 10 ; ferritin is 11.  I would recommend Feraheme infusion.   Please schedule for 1 Feraheme infusion if patient is interested; otherwise patient follow-up with me as planned. Thx GB

## 2018-07-12 NOTE — Assessment & Plan Note (Addendum)
#  Anemia iron deficient-likely secondary to gastric losses versus others.  Hemoglobin is 10.8; improved; stable on PO iron. Await capsule study with Dr.Mann [will need to be off plavix].  Rectum with IV iron.  Await iron studies from today.  If low would recommend Feraheme.  # Leucopenia/ neutropenia-intermittent. 3.2 today;ANC 1.9; hold off bmbx.   # CAD s/p stenting- on asprin+ plavix x6 months; A.fib- on Coumadin.   #Hypertension: Blood pressures 562 systolic.  Recommend continued follow-up with cardiology.  #History of OSA-CPAP/Oxygen- STABLE.  # DISPOSITION:  # HOLD off IV ferrahem today; will call if feritin is low.  # follow up in 4 months/cbc;possible IV ferrahem- Dr.B

## 2018-07-13 NOTE — Telephone Encounter (Signed)
Patient notified of these lab results and verbalized understanding.  Patient states that he is not having any symptoms of low iron, so he would like to hold off on the infusion at this time. He states he will call if he becomes symptomatic or has any other concerns  before his next appt.

## 2018-07-14 ENCOUNTER — Encounter: Payer: Self-pay | Admitting: *Deleted

## 2018-07-14 DIAGNOSIS — Z955 Presence of coronary angioplasty implant and graft: Secondary | ICD-10-CM

## 2018-07-15 ENCOUNTER — Ambulatory Visit (INDEPENDENT_AMBULATORY_CARE_PROVIDER_SITE_OTHER): Payer: PPO | Admitting: Cardiovascular Disease

## 2018-07-15 ENCOUNTER — Ambulatory Visit: Payer: PPO | Admitting: Internal Medicine

## 2018-07-15 ENCOUNTER — Ambulatory Visit: Payer: PPO

## 2018-07-15 ENCOUNTER — Encounter: Payer: Self-pay | Admitting: Cardiovascular Disease

## 2018-07-15 ENCOUNTER — Other Ambulatory Visit: Payer: PPO

## 2018-07-15 VITALS — BP 138/80 | HR 48 | Ht 68.0 in | Wt 187.0 lb

## 2018-07-15 DIAGNOSIS — I359 Nonrheumatic aortic valve disorder, unspecified: Secondary | ICD-10-CM | POA: Diagnosis not present

## 2018-07-15 DIAGNOSIS — I4819 Other persistent atrial fibrillation: Secondary | ICD-10-CM

## 2018-07-15 DIAGNOSIS — E785 Hyperlipidemia, unspecified: Secondary | ICD-10-CM | POA: Diagnosis not present

## 2018-07-15 DIAGNOSIS — I6523 Occlusion and stenosis of bilateral carotid arteries: Secondary | ICD-10-CM

## 2018-07-15 DIAGNOSIS — I2510 Atherosclerotic heart disease of native coronary artery without angina pectoris: Secondary | ICD-10-CM

## 2018-07-15 NOTE — Patient Instructions (Signed)
Medication Instructions:  No changes If you need a refill on your cardiac medications before your next appointment, please call your pharmacy.   Lab work: None ordered  Testing/Procedures: None ordered  Follow-Up: At [facility], you and your health needs are our priority.  As part of our continuing mission to provide you with exceptional heart care, we have created designated Provider Care Teams.  These Care Teams include your primary Cardiologist (physician) and Advanced Practice Providers (APPs -  Physician Assistants and Nurse Practitioners) who all work together to provide you with the care you need, when you need it. You will need a follow up appointment in 6 months.  Please call our office 2 months in advance to schedule this appointment.  You may see Muhammad [name], MD or one of the following Advanced Practice Providers on your designated Care Team:   Christopher Berge, NP Ryan Dunn, PA-C  

## 2018-07-15 NOTE — Progress Notes (Signed)
Cardiology Office Note   Date:  07/15/2018   ID:  Seth Hardy, DOB 21-Sep-1945, MRN [id number]  PCP:  Rusty Aus, MD  Cardiologist:   Kathlyn Sacramento, MD   Chief Complaint  Patient presents with  . other    2 mo f/u. Medications reviewed verbally with patient."Doing well"      History of Present Illness: Seth Hardy is a 73 y.o. male who Is here today for a follow-up visit . He has a history of coronary artery disease and is status CABG in September 2011 with subsequent bare metal stenting of the right coronary artery in 2013. He also has a history of persistent atrial fibrillation maintained in sinus rhythm on small dose amiodarone and is chronically anticoagulated on Coumadin. He has other chronic medical conditions that include aortic stenosis, hypertension, hyperlipidemia, bilateral carotid arterial disease status post left carotid endarterectomy in April 2017, and diabetes.   Most recent echocardiogram in July 2018 showed normal ejection fraction with mild aortic stenosis.  Mean gradient was 13 mmHg. He has known history of iron deficiency anemia due to gastric polyp which was resected.  He was seen in November for worsening angina.  I repeated his echocardiogram which showed normal LV systolic function.  Aortic stenosis was found to be moderate with a mean gradient of 27 mmHg. A right and left cardiac catheterization was performed which showed significant underlying three-vessel coronary artery disease with patent LIMA to LAD and SVG to OM with known chronically occluded SVG to RCA.  Proximal RCA stent was patent with only mild in-stent restenosis.  However, he was found to have severe calcified stenosis of the distal RCA.  Right heart catheterization showed mildly elevated filling pressures with normal cardiac output.  Aortic stenosis was moderate with a mean gradient of 20 mmHg. The patient underwent staged orbital atherectomy of the distal right coronary artery with  drug-eluting stent placement.  This was done on December 4.  He has been doing very well since then with no chest pain, shortness of breath or palpitations.  He is attending cardiac rehab.  He continues to have iron deficiency anemia being managed at the cancer center.  During last visit, I switch him from atorvastatin to rosuvastatin.  Past Medical History:  Diagnosis Date  . Anemia 08/2015   received 2 units rbc one week ago, 3 IV iron infusion -last 1 week.  . Basal cell carcinoma of skin 2012   removed several spots from arms and back  . CAD (coronary artery disease)    a. 01/2010 CABG x 4: LIMA->LAD, VG->D1, VG->OM1, VG->PDA. b. NSTEMI 02/2012 in setting of AF-RVR with cath s/p BMS to RCA (plan for 1 month, possibly up to 3 months of Plavix)  . Carotid disease, bilateral ([place])    a. 03/2011 U/S: 40-59% bilat Carotid dzs;  b. 04/2015 Carotid U/S: 40-59% bilat ICA stenosis; c. 05/2015 CTA Neck: 32% RICA, 67% LICA, mod-marked R vertebral stenosis, mod L vertebral stenosis.  . Chronic kidney disease    small obtusion per pcp. ultrasound done on 08/29/2015  . Diabetes mellitus type II, controlled ([place])    a. Variable CBG 02/2012 - several meds adjusted.  Marland Kitchen Dysrhythmia    intermittent Atrial Fibrillation  . GERD (gastroesophageal reflux disease)   . Heart murmur   . Hyperlipidemia   . Hypertension   . Hypertensive heart disease   . Iron deficiency anemia    "gets infusions q once in awhile" (04/27/2018)  .  Macular edema bil   lazer work done previously  . Mild aortic stenosis    a. 03/2011 Echo: EF 55-60%, Mild AS. b. Mild by cath 02/2012; c. 01/2014 Echo: EF 65-70%, Gr 1 DD, mild AS, mildly dil LA.  . Multiple gastric ulcers 2006  . Myocardial infarction ([place]) 02/2012   stents (x1) at time  . Myocardial infarction [facility])    "2nd one was after 02/2012; don't know date" (04/27/2018)  . Obesity   . On home oxygen therapy    "2.5 w/CPAP" (04/27/2018)  . OSA on CPAP    CPAP settings 3 with  oxygen 2.5 (04/27/2018)  . PAF (paroxysmal atrial fibrillation) ([place])    a. Newly dx 02/2012 & initiated on Coumadin (spont converted to NSR).  . Shortness of breath dyspnea   . Transfusion history    transfusions -1 month ago -2 units    Past Surgical History:  Procedure Laterality Date  . ANGIOPLASTY    . BASAL CELL CARCINOMA EXCISION     "shoulder, arm X 2"  . CARDIAC CATHETERIZATION  2014  . CATARACT EXTRACTION W/ INTRAOCULAR LENS  IMPLANT, BILATERAL Bilateral 2016  . COLONOSCOPY WITH PROPOFOL N/A 10/15/2015   Procedure: COLONOSCOPY WITH PROPOFOL;  Surgeon: Gatha Mayer, MD;  Location: [facility];  Service: Endoscopy;  Laterality: N/A;  . CORONARY ANGIOPLASTY     Dr. Fletcher Anon  . CORONARY ARTERY BYPASS GRAFT  2011   CABG X3-4; [place], [place], [place],   . CORONARY ATHERECTOMY  04/27/2018  . CORONARY ATHERECTOMY N/A 04/27/2018   Procedure: CORONARY ATHERECTOMY;  Surgeon: Belva Crome, MD;  Location: [place] [facility];  Service: Cardiovascular;  Laterality: N/A;  . cyst L kidney  [place]  . ENDARTERECTOMY Left 08/30/2015   Procedure: ENDARTERECTOMY CAROTID;  Surgeon: Katha Cabal, MD;  Location: [facility] ORS;  Service: Vascular;  Laterality: Left;  . ESOPHAGOGASTRODUODENOSCOPY (EGD) WITH PROPOFOL N/A 10/15/2015   Procedure: ESOPHAGOGASTRODUODENOSCOPY (EGD) WITH PROPOFOL;  Surgeon: Gatha Mayer, MD;  Location: [facility];  Service: Endoscopy;  Laterality: N/A;  . EYE SURGERY Bilateral    "has macular edema cauterized" (04/27/2018)  . INGUINAL HERNIA REPAIR Right   . LEFT HEART CATHETERIZATION WITH CORONARY ANGIOGRAM N/A 03/09/2012   Procedure: LEFT HEART CATHETERIZATION WITH CORONARY ANGIOGRAM;  Surgeon: Wellington Hampshire, MD;  Location: [facility] [facility];  Service: Cardiovascular;  Laterality: N/A;  . PERCUTANEOUS CORONARY STENT INTERVENTION (PCI-S)  03/09/2012   Procedure: PERCUTANEOUS CORONARY STENT INTERVENTION (PCI-S);  Surgeon: Wellington Hampshire, MD;  Location: [facility] CATH  LAB;  Service: Cardiovascular;;  . RIGHT/LEFT HEART CATH AND CORONARY ANGIOGRAPHY N/A 04/25/2018   Procedure: RIGHT/LEFT HEART CATH AND CORONARY ANGIOGRAPHY;  Surgeon: Wellington Hampshire, MD;  Location: [place] [facility];  Service: Cardiovascular;  Laterality: N/A;     Current Outpatient Medications  Medication Sig Dispense Refill  . acetaminophen (TYLENOL) 500 MG tablet Take 1,000 mg by mouth every 6 (six) hours as needed for moderate pain or headache.    . ALPRAZolam (XANAX) 0.25 MG tablet Take 0.25 mg by mouth daily as needed for anxiety.    Marland Kitchen amiodarone (PACERONE) 200 MG tablet TAKE 1/2 TABLET AT BEDTIME (Patient taking differently: Take 100 mg by mouth every evening. ) 45 tablet 0  . amLODipine (NORVASC) 5 MG tablet Take one tablet by mouth one time daily 90 tablet 3  . clopidogrel (PLAVIX) 75 MG tablet Take 1 tablet (75 mg total) by mouth daily. 30 tablet  5  . Cyanocobalamin (B-12) 1000 MCG CAPS Take 1,000 mcg by mouth every evening.     . Ferrous Gluconate-C-Folic Acid (IRON-C PO) Take 1 tablet by mouth daily.    . furosemide (LASIX) 20 MG tablet Take 1 tablet (20 mg total) by mouth daily. (Patient taking differently: Take 20 mg by mouth daily. Patient taking prn) 30 tablet 6  . glimepiride (AMARYL) 4 MG tablet Take 4 mg by mouth daily.     . insulin glargine (LANTUS) 100 UNIT/ML injection Inject 20 Units into the skin daily.     . metFORMIN (GLUCOPHAGE) 500 MG tablet Take 500 mg by mouth 2 (two) times daily with a meal.     . NITROSTAT 0.4 MG SL tablet Place 1 tablet (0.4 mg total) under the tongue every 5 (five) minutes as needed for chest pain. 25 tablet 1  . NOVOLOG FLEXPEN 100 UNIT/ML FlexPen Inject 6-36 Units into the skin 2 (two) times daily with a meal.     . omega-3 acid ethyl esters (LOVAZA) 1 g capsule Take 1 capsule (1 g total) by mouth daily. 90 capsule 4  . Omega-3 Fatty Acids (FISH OIL) 1000 MG CAPS Take 1,000 mg by mouth every evening.    . OXYGEN Inhale 2.5 L/min into  the lungs at bedtime. With CPAP    . pantoprazole (PROTONIX) 40 MG tablet Take 1 tablet (40 mg total) by mouth daily. 90 tablet 3  . Polyethylene Glycol 400 (BLINK TEARS OP) Place 1 drop into both eyes daily as needed (dry eyes).    . ramipril (ALTACE) 10 MG capsule Take 10 mg by mouth daily.     . rosuvastatin (CRESTOR) 40 MG tablet Take 1 tablet (40 mg total) by mouth daily. 90 tablet 3  . vitamin C (ASCORBIC ACID) 500 MG tablet Take 500 mg by mouth every evening.    . warfarin (COUMADIN) 5 MG tablet Take 2.5-5 mg by mouth See admin instructions. Take 5 mg at night on Sun, Mon, Wed, Thur, and Fri.  Take 2.5 mg at night on Tues and Sat     No current facility-administered medications for this visit.     Allergies:   Patient has no known allergies.    Social History:  The patient  reports that he has never smoked. He has never used smokeless tobacco. He reports current alcohol use. He reports that he does not use drugs.   Family History:  [name] family history includes Bladder Cancer in his mother; COPD in his mother; Cancer in an other family member; Heart attack in his father; Kidney disease in his paternal uncle; Liver cancer in his maternal aunt; Lung cancer in his maternal aunt.    ROS:  Please see the history of present illness.   Otherwise, review of systems are positive for none.   All other systems are reviewed and negative.    PHYSICAL EXAM: VS:  BP 138/80 (BP Location: Left Arm, Patient Position: Sitting, Cuff Size: Normal)   Pulse (!) 48   Ht 5\' 8"  (1.727 m)   Wt 187 lb (84.8 kg)   BMI 28.43 kg/m  , BMI Body mass index is 28.43 kg/m. GEN: Well nourished, well developed, in no acute distress  HEENT: normal  Neck: no JVD, carotid bruits, or masses Cardiac: RRR; no rubs, or gallops, mild bilateral leg edema . There is a 3/6 crescendo decrescendo systolic murmur in the aortic area which is mid peaking.  S2 is diminished.  The murmur  radiates to the carotid  arteries. Respiratory:  clear to auscultation bilaterally, normal work of breathing GI: soft, nontender, nondistended, + BS MS: no deformity or atrophy  Skin: warm and dry, no rash Neuro:  Strength and sensation are intact Psych: euthymic mood, full affect   EKG:  EKG  ordered today. EKG showed sinus bradycardia with anterolateral ST changes suggestive of ischemia.   Recent Labs: 02/02/2018: ALT 29 07/12/2018: BUN 18; Creatinine, Ser 1.22; Hemoglobin 10.8; Platelets 169; Potassium 4.1; Sodium 137    Lipid Panel    Component Value Date/Time   CHOL 99 03/09/2012 0243   TRIG 84 03/09/2012 0243   HDL 37 (L) 03/09/2012 0243   CHOLHDL 2.7 03/09/2012 0243   VLDL 17 03/09/2012 0243   LDLCALC 45 03/09/2012 0243      Wt Readings from Last 3 Encounters:  07/15/18 187 lb (84.8 kg)  07/12/18 192 lb (87.1 kg)  05/30/18 190 lb 14.4 oz (86.6 kg)        ASSESSMENT AND PLAN:  1.  Coronary artery disease involving bypass graft without angina: He is doing very well at the present time with no anginal symptoms.  Continue Plavix without aspirin given that he is on warfarin. The plan is to keep him on Plavix until December 2020.  However, if the patient needs to undergo endoscopic procedures to evaluate iron deficiency anemia, Plavix can be held for 5 days starting in June.  2. Bilateral carotid artery stenosis: Status post  left carotid endarterectomy. Followed by Dr. Delana Meyer.  3. moderate aortic stenosis: Recommend repeat echocardiogram in December 2020.  4. Persistent atrial fibrillation: Maintaining in sinus rhythm with small dose amiodarone.    Continue anticoagulation with warfarin.  We discussed alternative to warfarin and he is going to check with his insurance about the cost of Eliquis or Xarelto.  5. Hyperlipidemia: Continue high-dose rosuvastatin.  His LDL has been below 70.    Disposition:   Follow-up in 6 months   Signed,  Kathlyn Sacramento, MD  07/15/2018 4:18 PM    [place]

## 2018-07-18 ENCOUNTER — Ambulatory Visit (INDEPENDENT_AMBULATORY_CARE_PROVIDER_SITE_OTHER): Payer: PPO

## 2018-07-18 ENCOUNTER — Encounter: Payer: PPO | Admitting: *Deleted

## 2018-07-18 DIAGNOSIS — Z7901 Long term (current) use of anticoagulants: Secondary | ICD-10-CM | POA: Diagnosis not present

## 2018-07-18 DIAGNOSIS — Z955 Presence of coronary angioplasty implant and graft: Secondary | ICD-10-CM | POA: Diagnosis not present

## 2018-07-18 DIAGNOSIS — Z5181 Encounter for therapeutic drug level monitoring: Secondary | ICD-10-CM

## 2018-07-18 LAB — POCT INR: INR: 3.7 — AB (ref 2.0–3.0)

## 2018-07-18 NOTE — Progress Notes (Signed)
Daily Session Note  Patient Details  Name: Seth Hardy MRN: [id number] Date of Birth: 1946/02/01 Referring Provider:     Cardiac Rehab from 05/30/2018 in [facility]  Referring Provider  Kathlyn Sacramento MD      Encounter Date: 07/18/2018  Check In: Session Check In - 07/18/18 0759      Check-In   Supervising physician immediately available to respond to emergencies  See telemetry face sheet for immediately available ER MD    Location  [facility]    Staff Present  Earlean Shawl, BS, ACSM CEP, Exercise Physiologist;Jessica Luan Pulling, MA, RCEP, CCRP, Exercise Physiologist;Susanne Bice, RN, BSN, CCRP    Medication changes reported      No    Fall or balance concerns reported     No    Tobacco Cessation  No Change    Warm-up and Cool-down  Performed as group-led instruction    Resistance Training Performed  Yes    VAD Patient?  No    PAD/SET Patient?  No      Pain Assessment   Currently in Pain?  No/denies    Multiple Pain Sites  No          Social History   Tobacco Use  Smoking Status Never Smoker  Smokeless Tobacco Never Used  Tobacco Comment   tobacco use - no.no passive smoke in home    Goals Met:  Independence with exercise equipment Exercise tolerated well Personal goals reviewed No report of cardiac concerns or symptoms Strength training completed today  Goals Unmet:  Not Applicable  Comments: Pt able to follow exercise prescription today without complaint.  Will continue to monitor for progression.    Dr. Emily Filbert is Medical Director for [place] and [facility].

## 2018-07-18 NOTE — Patient Instructions (Signed)
Please skip coumadin tonight, then START NEW DOSAGE of 1 tablet daily except 1/2 tablet on TUESDAYS, [place].   Be consistent w/ your greens intake - pick day(s) each week to have your greens and have them every week on that day(s). Recheck INR in 3 weeks.

## 2018-07-20 ENCOUNTER — Encounter: Payer: Self-pay | Admitting: *Deleted

## 2018-07-20 DIAGNOSIS — Z955 Presence of coronary angioplasty implant and graft: Secondary | ICD-10-CM

## 2018-07-20 NOTE — Progress Notes (Signed)
Cardiac Individual Treatment Plan  Patient Details  Name: Seth Hardy MRN: [id number] Date of Birth: 12-Feb-1946 Referring Provider:     Cardiac Rehab from 05/30/2018 in [facility] Cardiac and Pulmonary Rehab  Referring Provider  Kathlyn Sacramento MD      Initial Encounter Date:    Cardiac Rehab from 05/30/2018 in [facility] Cardiac and Pulmonary Rehab  Date  05/30/18      Visit Diagnosis: Status post coronary artery stent placement  Patient's Home Medications on Admission:  Current Outpatient Medications:  .  acetaminophen (TYLENOL) 500 MG tablet, Take 1,000 mg by mouth every 6 (six) hours as needed for moderate pain or headache., Disp: , Rfl:  .  ALPRAZolam (XANAX) 0.25 MG tablet, Take 0.25 mg by mouth daily as needed for anxiety., Disp: , Rfl:  .  amiodarone (PACERONE) 200 MG tablet, TAKE 1/2 TABLET AT BEDTIME (Patient taking differently: Take 100 mg by mouth every evening. ), Disp: 45 tablet, Rfl: 0 .  amLODipine (NORVASC) 5 MG tablet, Take one tablet by mouth one time daily, Disp: 90 tablet, Rfl: 3 .  clopidogrel (PLAVIX) 75 MG tablet, Take 1 tablet (75 mg total) by mouth daily., Disp: 30 tablet, Rfl: 5 .  Cyanocobalamin (B-12) 1000 MCG CAPS, Take 1,000 mcg by mouth every evening. , Disp: , Rfl:  .  Ferrous Gluconate-C-Folic Acid (IRON-C PO), Take 1 tablet by mouth daily., Disp: , Rfl:  .  furosemide (LASIX) 20 MG tablet, Take 1 tablet (20 mg total) by mouth daily. (Patient taking differently: Take 20 mg by mouth daily. Patient taking prn), Disp: 30 tablet, Rfl: 6 .  glimepiride (AMARYL) 4 MG tablet, Take 4 mg by mouth daily. , Disp: , Rfl:  .  insulin glargine (LANTUS) 100 UNIT/ML injection, Inject 20 Units into the skin daily. , Disp: , Rfl:  .  metFORMIN (GLUCOPHAGE) 500 MG tablet, Take 500 mg by mouth 2 (two) times daily with a meal. , Disp: , Rfl:  .  NITROSTAT 0.4 MG SL tablet, Place 1 tablet (0.4 mg total) under the tongue every 5 (five) minutes as needed for chest pain., Disp: 25  tablet, Rfl: 1 .  NOVOLOG FLEXPEN 100 UNIT/ML FlexPen, Inject 6-36 Units into the skin 2 (two) times daily with a meal. , Disp: , Rfl:  .  omega-3 acid ethyl esters (LOVAZA) 1 g capsule, Take 1 capsule (1 g total) by mouth daily., Disp: 90 capsule, Rfl: 4 .  Omega-3 Fatty Acids (FISH OIL) 1000 MG CAPS, Take 1,000 mg by mouth every evening., Disp: , Rfl:  .  OXYGEN, Inhale 2.5 L/min into the lungs at bedtime. With CPAP, Disp: , Rfl:  .  pantoprazole (PROTONIX) 40 MG tablet, Take 1 tablet (40 mg total) by mouth daily., Disp: 90 tablet, Rfl: 3 .  Polyethylene Glycol 400 (BLINK TEARS OP), Place 1 drop into both eyes daily as needed (dry eyes)., Disp: , Rfl:  .  ramipril (ALTACE) 10 MG capsule, Take 10 mg by mouth daily. , Disp: , Rfl:  .  rosuvastatin (CRESTOR) 40 MG tablet, Take 1 tablet (40 mg total) by mouth daily., Disp: 90 tablet, Rfl: 3 .  vitamin C (ASCORBIC ACID) 500 MG tablet, Take 500 mg by mouth every evening., Disp: , Rfl:  .  warfarin (COUMADIN) 5 MG tablet, Take 2.5-5 mg by mouth See admin instructions. Take 5 mg at night on Sun, Mon, Wed, Thur, and Fri.  Take 2.5 mg at night on Tues and Sat, Disp: , Rfl:  Past Medical History: Past Medical History:  Diagnosis Date  . Anemia 08/2015   received 2 units rbc one week ago, 3 IV iron infusion -last 1 week.  . Basal cell carcinoma of skin 2012   removed several spots from arms and back  . CAD (coronary artery disease)    a. 01/2010 CABG x 4: LIMA->LAD, VG->D1, VG->OM1, VG->PDA. b. NSTEMI 02/2012 in setting of AF-RVR with cath s/p BMS to RCA (plan for 1 month, possibly up to 3 months of Plavix)  . Carotid disease, bilateral ([place])    a. 03/2011 U/S: 40-59% bilat Carotid dzs;  b. 04/2015 Carotid U/S: 40-59% bilat ICA stenosis; c. 05/2015 CTA Neck: 56% RICA, 21% LICA, mod-marked R vertebral stenosis, mod L vertebral stenosis.  . Chronic kidney disease    small obtusion per pcp. ultrasound done on 08/29/2015  . Diabetes mellitus type II,  controlled ([place])    a. Variable CBG 02/2012 - several meds adjusted.  Marland Kitchen Dysrhythmia    intermittent Atrial Fibrillation  . GERD (gastroesophageal reflux disease)   . Heart murmur   . Hyperlipidemia   . Hypertension   . Hypertensive heart disease   . Iron deficiency anemia    "gets infusions q once in awhile" (04/27/2018)  . Macular edema bil   lazer work done previously  . Mild aortic stenosis    a. 03/2011 Echo: EF 55-60%, Mild AS. b. Mild by cath 02/2012; c. 01/2014 Echo: EF 65-70%, Gr 1 DD, mild AS, mildly dil LA.  . Multiple gastric ulcers 2006  . Myocardial infarction ([place]) 02/2012   stents (x1) at time  . Myocardial infarction [facility])    "2nd one was after 02/2012; don't know date" (04/27/2018)  . Obesity   . On home oxygen therapy    "2.5 w/CPAP" (04/27/2018)  . OSA on CPAP    CPAP settings 3 with oxygen 2.5 (04/27/2018)  . PAF (paroxysmal atrial fibrillation) ([place])    a. Newly dx 02/2012 & initiated on Coumadin (spont converted to NSR).  . Shortness of breath dyspnea   . Transfusion history    transfusions -1 month ago -2 units    Tobacco Use: Social History   Tobacco Use  Smoking Status Never Smoker  Smokeless Tobacco Never Used  Tobacco Comment   tobacco use - no.no passive smoke in home    Labs: Recent Review Flowsheet Data    Labs for ITP Cardiac and Pulmonary Rehab Latest Ref Rng & Units 02/17/2010 02/17/2010 02/17/2010 03/08/2012 03/09/2012   Cholestrol 0 - 200 mg/dL - - - - 99   LDLCALC 0 - 99 mg/dL - - - - 45   HDL >39 mg/dL - - - - 37(L)   Trlycerides <150 mg/dL - - - - 84   Hemoglobin A1c <5.7 % - - - - -   PHART 7.350 - 7.450 - 7.341(L) 7.379 - -   PCO2ART 35.0 - 45.0 mmHg - 40.6 36.7 - -   HCO3 20.0 - 24.0 mEq/L - 21.8 21.5 - -   TCO2 0 - 100 mmol/L _0 -   ACIDBASEDEF 0.0 - 2.0 mmol/L - 3.0(H) 3.0(H) - -   O2SAT % - 98.0 95.0 - -       Exercise Target Goals: Exercise Program Goal: Individual exercise prescription set using results from  initial 6 min walk test and THRR while considering  patient's activity barriers and safety.   Exercise Prescription Goal: Initial exercise prescription builds to 30-45 minutes a  day of aerobic activity, 2-3 days per week.  Home exercise guidelines will be given to patient during program as part of exercise prescription that the participant will acknowledge.  Activity Barriers & Risk Stratification: Activity Barriers & Cardiac Risk Stratification - 05/30/18 1258      Activity Barriers & Cardiac Risk Stratification   Activity Barriers  None    Cardiac Risk Stratification  High       6 Minute Walk: 6 Minute Walk    Row Name 05/30/18 1348         6 Minute Walk   Phase  Initial     Distance  1530 feet     Walk Time  6 minutes     # of Rest Breaks  0     MPH  2.9     METS  3.48     RPE  11     VO2 Peak  12.19     Symptoms  No     Resting HR  48 bpm     Resting BP  136/64     Resting Oxygen Saturation   100 %     Exercise Oxygen Saturation  during 6 min walk  98 %     Max Ex. HR  114 bpm     Max Ex. BP  166/74     2 Minute Post BP  146/74        Oxygen Initial Assessment:   Oxygen Re-Evaluation:   Oxygen Discharge (Final Oxygen Re-Evaluation):   Initial Exercise Prescription: Initial Exercise Prescription - 05/30/18 1300      Date of Initial Exercise RX and Referring Provider   Date  05/30/18    Referring Provider  Kathlyn Sacramento MD      Treadmill   MPH  2.8    Grade  0.5    Minutes  15    METs  3.34      NuStep   Level  4    SPM  80    Minutes  15    METs  3      Recumbant Elliptical   Level  2    RPM  50    Minutes  15    METs  3      Prescription Details   Frequency (times per week)  2    Duration  Progress to 45 minutes of aerobic exercise without signs/symptoms of physical distress      Intensity   THRR 40-80% of Max Heartrate  88-128    Ratings of Perceived Exertion  11-13    Perceived Dyspnea  0-4      Progression   Progression   Continue to progress workloads to maintain intensity without signs/symptoms of physical distress.      Resistance Training   Training Prescription  Yes    Weight  4 lbs    Reps  10-15       Perform Capillary Blood Glucose checks as needed.  Exercise Prescription Changes: Exercise Prescription Changes    Row Name 05/30/18 1300 06/13/18 0900 06/28/18 0900 07/14/18 0800       Response to Exercise   Blood Pressure (Admit)  136/64  122/64  124/70  130/58    Blood Pressure (Exercise)  166/74  144/82  126/70  148/60    Blood Pressure (Exit)  146/74  130/60  130/70  128/62    Heart Rate (Admit)  48 bpm  47 bpm  88 bpm  68 bpm  Heart Rate (Exercise)  114 bpm  85 bpm  80 bpm  74 bpm    Heart Rate (Exit)  66 bpm  51 bpm  52 bpm  58 bpm    Oxygen Saturation (Admit)  100 %  -  -  -    Oxygen Saturation (Exercise)  98 %  -  -  -    Rating of Perceived Exertion (Exercise)  _0 Symptoms  none  none  none  none    Comments  walk test results  -  -  -    Duration  -  Progress to 45 minutes of aerobic exercise without signs/symptoms of physical distress  Continue with 45 min of aerobic exercise without signs/symptoms of physical distress.  Continue with 45 min of aerobic exercise without signs/symptoms of physical distress.    Intensity  -  THRR unchanged  THRR unchanged  THRR unchanged      Progression   Progression  -  Continue to progress workloads to maintain intensity without signs/symptoms of physical distress.  Continue to progress workloads to maintain intensity without signs/symptoms of physical distress.  Continue to progress workloads to maintain intensity without signs/symptoms of physical distress.    Average METs  -  3.03  3.19  2.87      Resistance Training   Training Prescription  -  Yes  Yes  Yes    Weight  -  4 lbs  4 lbs  4 lbs    Reps  -  10-15  10-15  10-15      Interval Training   Interval Training  -  No  No  No      Treadmill   MPH  -  2.4  2.4  2.4     Grade  -  0.5  0.5  0.5    Minutes  -  _1 METs  -  _2 Recumbant Bike   Level  -  _3 Watts  -  33  27  -    Minutes  -  _4 METs  -  3.47  3.47  3.1      NuStep   Level  -  _5 Minutes  -  _6 METs  -  2.8  3.1  2.5      Home Exercise Plan   Plans to continue exercise at  -  Home (comment) walking  Home (comment) walking  Home (comment) walking    Frequency  -  Add 2 additional days to program exercise sessions.  Add 2 additional days to program exercise sessions.  Add 2 additional days to program exercise sessions.    Initial Home Exercises Provided  -  06/13/18  06/13/18  06/13/18       Exercise Comments: Exercise Comments    Row Name 06/03/18 [phone]           Exercise Comments  First full day of exercise!  Patient was oriented to gym and equipment including functions, settings, policies, and procedures.  Patient's individual exercise prescription and treatment plan were reviewed.  All starting workloads were established based on the results of the 6 minute walk test done at initial orientation visit.  The plan for exercise progression was also introduced and progression will be customized based on patient's performance and goals.          Exercise Goals and Review: Exercise Goals    Row Name 05/30/18 1351             Exercise Goals   Increase Physical Activity  Yes       Intervention  Provide advice, education, support and counseling about physical activity/exercise needs.;Develop an individualized exercise prescription for aerobic and resistive training based on initial evaluation findings, risk stratification, comorbidities and participant's personal goals.       Expected Outcomes  Short Term: Attend rehab on a regular basis to increase amount of physical activity.;Long Term: Add in home exercise to make exercise part of routine and to increase amount of physical activity.;Long Term: Exercising regularly at least  3-5 days a week.       Increase Strength and Stamina  Yes       Intervention  Provide advice, education, support and counseling about physical activity/exercise needs.;Develop an individualized exercise prescription for aerobic and resistive training based on initial evaluation findings, risk stratification, comorbidities and participant's personal goals.       Expected Outcomes  Short Term: Increase workloads from initial exercise prescription for resistance, speed, and METs.;Short Term: Perform resistance training exercises routinely during rehab and add in resistance training at home;Long Term: Improve cardiorespiratory fitness, muscular endurance and strength as measured by increased METs and functional capacity (6MWT)       Able to understand and use rate of perceived exertion (RPE) scale  Yes       Intervention  Provide education and explanation on how to use RPE scale       Expected Outcomes  Short Term: Able to use RPE daily in rehab to express subjective intensity level;Long Term:  Able to use RPE to guide intensity level when exercising independently       Knowledge and understanding of Target Heart Rate Range (THRR)  Yes       Intervention  Provide education and explanation of THRR including how the numbers were predicted and where they are located for reference       Expected Outcomes  Short Term: Able to state/look up THRR;Short Term: Able to use daily as guideline for intensity in rehab;Long Term: Able to use THRR to govern intensity when exercising independently       Able to check pulse independently  Yes       Intervention  Provide education and demonstration on how to check pulse in carotid and radial arteries.;Review the importance of being able to check your own pulse for safety during independent exercise       Expected Outcomes  Short Term: Able to explain why pulse checking is important during independent exercise;Long Term: Able to check pulse independently and accurately        Understanding of Exercise Prescription  Yes       Intervention  Provide education, explanation, and written materials on patient's individual exercise prescription       Expected Outcomes  Short Term: Able to explain program exercise prescription;Long Term: Able to explain home exercise prescription to exercise independently          Exercise Goals Re-Evaluation : Exercise Goals Re-Evaluation    Row Name 06/03/18 0752 06/13/18 0908 06/28/18 0929 07/14/18 0800 07/18/18 0834     Exercise Goal Re-Evaluation   Exercise Goals Review  Increase Physical Activity;Increase Strength and  Stamina;Knowledge and understanding of Target Heart Rate Range (THRR);Able to understand and use rate of perceived exertion (RPE) scale;Understanding of Exercise Prescription  Increase Physical Activity;Increase Strength and Stamina;Able to understand and use rate of perceived exertion (RPE) scale;Knowledge and understanding of Target Heart Rate Range (THRR);Able to check pulse independently;Understanding of Exercise Prescription  Increase Physical Activity;Increase Strength and Stamina;Understanding of Exercise Prescription  Increase Physical Activity;Increase Strength and Stamina;Understanding of Exercise Prescription  Increase Physical Activity;Increase Strength and Stamina;Understanding of Exercise Prescription   Comments  Reviewed RPE scale, THR and program prescription with pt today.  Pt voiced understanding and was given a copy of goals to take home.   Reviewed home exercise with pt today.  Pt plans to walk at home and use weights for exercise.  Reviewed THR, pulse, RPE, sign and symptoms, NTG use, and when to call 911 or MD.  Also discussed weather considerations and indoor options.  Pt voiced understanding.  Lucan has been doing well in rehab.  He is now up to 2.4 mph on the treadmill and level 3 on the recumbent bike.  We will continue to monitor his prorgress.   Monico was out on vacation last week.  He has moved his NuStep  up to level 5.  We will continue to monitor his progress.   Eleazar has been doing his home exercise for three days a week for 15 min.  He feels like he is getting stronger and has more stamina overall.  We talked building his time at home to 30 min.    Expected Outcomes  Short: Use RPE daily to regulate intensity. Long: Follow program prescription in THR.  Short: Start to add in walking at home.  Long: Continue to exercise independently  Short: Continue to increase workloads.  Long: Continue to increase strength and stamina.   Short: Add more incline to treadmill.  Long: Continue to increase acitivity levels at home.   Short: Increase time at home to 30 min.  Long: Continue to increase acitivty level.       Discharge Exercise Prescription (Final Exercise Prescription Changes): Exercise Prescription Changes - 07/14/18 0800      Response to Exercise   Blood Pressure (Admit)  130/58    Blood Pressure (Exercise)  148/60    Blood Pressure (Exit)  128/62    Heart Rate (Admit)  68 bpm    Heart Rate (Exercise)  74 bpm    Heart Rate (Exit)  58 bpm    Rating of Perceived Exertion (Exercise)  14    Symptoms  none    Duration  Continue with 45 min of aerobic exercise without signs/symptoms of physical distress.    Intensity  THRR unchanged      Progression   Progression  Continue to progress workloads to maintain intensity without signs/symptoms of physical distress.    Average METs  2.87      Resistance Training   Training Prescription  Yes    Weight  4 lbs    Reps  10-15      Interval Training   Interval Training  No      Treadmill   MPH  2.4    Grade  0.5    Minutes  15    METs  3      Recumbant Bike   Level  3    Minutes  15    METs  3.1      NuStep   Level  5    Minutes  15    METs  2.5      Home Exercise Plan   Plans to continue exercise at  Home (comment)   walking   Frequency  Add 2 additional days to program exercise sessions.    Initial Home Exercises Provided   06/13/18       Nutrition:  Target Goals: Understanding of nutrition guidelines, daily intake of sodium <1594m, cholesterol <2075m calories 30% from fat and 7% or less from saturated fats, daily to have 5 or more servings of fruits and vegetables.  Biometrics: Pre Biometrics - 05/30/18 1352      Pre Biometrics   Height  _0  (1.702 m)    Weight  190 lb 14.4 oz (86.6 kg)    Waist Circumference  40 inches    Hip Circumference  38 inches    Waist to Hip Ratio  1.05 %    BMI (Calculated)  29.89    Single Leg Stand  17.43 seconds        Nutrition Therapy Plan and Nutrition Goals: Nutrition Therapy & Goals - 05/30/18 1253      Intervention Plan   Intervention  Prescribe, educate and counsel regarding individualized specific dietary modifications aiming towards targeted core components such as weight, hypertension, lipid management, diabetes, heart failure and other comorbidities.;Nutrition handout(s) given to patient.    Expected Outcomes  Short Term Goal: Understand basic principles of dietary content, such as calories, fat, sodium, cholesterol and nutrients.;Short Term Goal: A plan has been developed with personal nutrition goals set during dietitian appointment.;Long Term Goal: Adherence to prescribed nutrition plan.       Nutrition Assessments: Nutrition Assessments - 05/30/18 1253      MEDFICTS Scores   Pre Score  41       Nutrition Goals Re-Evaluation: Nutrition Goals Re-Evaluation    Row Name 07/18/18 [phone]           Goals   Nutrition Goal  Heart Healthy diet, lots of fruits and vegetables, lean meats, low fat, watching salt       Comment  He is trying to watch his diet as much as possible.  He does add salt to his food.  His wife is reading food labels for him.  They feel that they have a good grasp of the diet at this point.  He has been sticking with his lean meats and trying for fruits and vegetables.        Expected Outcome  Short: Continue to watch sodium.   Long: Continue to increase fruits and vegetables.           Nutrition Goals Discharge (Final Nutrition Goals Re-Evaluation): Nutrition Goals Re-Evaluation - 07/18/18 0838      Goals   Nutrition Goal  Heart Healthy diet, lots of fruits and vegetables, lean meats, low fat, watching salt    Comment  He is trying to watch his diet as much as possible.  He does add salt to his food.  His wife is reading food labels for him.  They feel that they have a good grasp of the diet at this point.  He has been sticking with his lean meats and trying for fruits and vegetables.     Expected Outcome  Short: Continue to watch sodium.  Long: Continue to increase fruits and vegetables.        Psychosocial: Target Goals: Acknowledge presence or absence of significant depression and/or stress, maximize coping skills, provide positive support system. Participant is  able to verbalize types and ability to use techniques and skills needed for reducing stress and depression.   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 He enjoys his part time jobs and his family life. He reports good sleep habits and no current stress concerns.     Family Dynamics   Good Support System?  Yes   Wife and sons     Barriers   Psychosocial barriers to participate in program  There are no identifiable barriers or psychosocial needs.;The patient should benefit from training in stress management and relaxation.      Screening Interventions   Interventions  Encouraged to exercise;Program counselor consult;To provide support and resources with identified psychosocial needs;Provide feedback about the scores to participant    Expected Outcomes  Short Term goal: Utilizing psychosocial counselor, staff and physician to assist with identification  of specific Stressors or current issues interfering with healing process. Setting desired goal for each stressor or current issue identified.;Long Term Goal: Stressors or current issues are controlled or eliminated.;Short Term goal: Identification and review with participant of any Quality of Life or Depression concerns found by scoring the questionnaire.;Long Term goal: The participant improves quality of Life and PHQ9 Scores as seen by post scores and/or verbalization of changes       Quality of Life Scores:  Quality of Life - 05/30/18 1252      Quality of Life   Select  Quality of Life      Quality of Life Scores   Health/Function Pre  25.88 %    Socioeconomic Pre  30 %    Psych/Spiritual Pre  29.14 %    Family Pre  30 %    GLOBAL Pre  28.08 %      Scores of 19 and below usually indicate a poorer quality of life in these areas.  A difference of  2-3 points is a clinically meaningful difference.  A difference of 2-3 points in the total score of the Quality of Life Index has been associated with significant improvement in overall quality of life, self-image, physical symptoms, and general health in studies assessing change in quality of life.  PHQ-9: Recent Review Flowsheet Data    Depression screen [facility] 2/9 05/30/2018   Decreased Interest 0   Down, Depressed, Hopeless 0   PHQ - 2 Score 0   Altered sleeping 0   Tired, decreased energy 0   Change in appetite 0   Feeling bad or failure about yourself  0   Trouble concentrating 0   Moving slowly or fidgety/restless 0   Suicidal thoughts 0   PHQ-9 Score 0     Interpretation of Total Score  Total Score Depression Severity:  1-4 = Minimal depression, 5-9 = Mild depression, 10-14 = Moderate depression, 15-19 = Moderately severe depression, 20-27 = Severe depression   Psychosocial Evaluation and Intervention: Psychosocial Evaluation - 06/06/18 0920      Psychosocial Evaluation & Interventions   Interventions  Encouraged to exercise  with the program and follow exercise prescription    Comments  Counselor met with Mr. Cockrell [facility]) today for initial psychosocial evaluation.  He is a 73 year old who has a strong support system with a spouse of 56 years; (2) adult sons; a brother locally  and active involvement in a local church community.  Verle had a stent inserted approximately a month ago and is diabetic.  He is sleeping well and has a good appetite.  Clifton denies a history of depression or anxiety or any current symptoms and reports typically being in a positive mood.  He has minimal stress in his life currently - other than his health conditions.  Matty has goals to lose weight and increase his stamina and strength while in this program.  [facility].  Staff will monitor Nishaan throughout the course of this program.      Expected Outcomes  Short:   Milas will meet with the dietician to address his weight loss goal.  He will also exercise consistently to increase his stamina and strength.  Long:  Mataio will maintain healthy life habits; including exercise and diet for his well-being.  [facility]    Continue Psychosocial Services   Follow up required by staff       Psychosocial Re-Evaluation: Psychosocial Re-Evaluation    [place] Name 07/18/18 [phone]             Psychosocial Re-Evaluation   Current issues with  Current Stress Concerns       Comments  Adarryl is doing well mentally. He has been sleeping well.  He was out on vacation and then was feeling a little run down; however, he did not need an iron infusion.  He has noted that the exercise has really helped build his strength back up.        Expected Outcomes  Short: Continue to attend regularly.   Long: Continue to stay postive.           Psychosocial Discharge (Final Psychosocial Re-Evaluation): Psychosocial Re-Evaluation - 07/18/18 0836      Psychosocial Re-Evaluation   Current issues with  Current  Stress Concerns    Comments  [place] is doing well mentally. He has been sleeping well.  He was out on vacation and then was feeling a little run down; however, he did not need an iron infusion.  He has noted that the exercise has really helped build his strength back up.     Expected Outcomes  Short: Continue to attend regularly.   Long: Continue to stay postive.        Vocational Rehabilitation: Provide vocational rehab assistance to qualifying candidates.   Vocational Rehab Evaluation & Intervention: Vocational Rehab - 05/30/18 1251      Initial Vocational Rehab Evaluation & Intervention   Assessment shows need for Vocational Rehabilitation  No       Education: Education Goals: Education classes will be provided on a variety of topics geared toward better understanding of heart health and risk factor modification. Participant will state understanding/return demonstration of topics presented as noted by education test scores.  Learning Barriers/Preferences: Learning Barriers/Preferences - 05/30/18 1250      Learning Barriers/Preferences   Learning Barriers  None    Learning Preferences  Individual Instruction       Education Topics:  AED/CPR: - Group verbal and written instruction with the use of models to demonstrate the basic use of the AED with the basic ABC's of resuscitation.   Cardiac Rehab from 07/18/2018 in [facility] Cardiac and Pulmonary Rehab  Date  07/18/18  Educator  SB  Instruction Review Code  1- Verbalizes Understanding      General Nutrition Guidelines/Fats and  Fiber: -Group instruction provided by verbal, written material, models and posters to present the general guidelines for heart healthy nutrition. Gives an explanation and review of dietary fats and fiber.   Cardiac Rehab from 07/18/2018 in [facility] Cardiac and Pulmonary Rehab  Date  06/06/18  Educator  LB  Instruction Review Code  1- Verbalizes Understanding      Controlling Sodium/Reading Food  Labels: -Group verbal and written material supporting the discussion of sodium use in heart healthy nutrition. Review and explanation with models, verbal and written materials for utilization of the food label.   Exercise Physiology & General Exercise Guidelines: - Group verbal and written instruction with models to review the exercise physiology of the cardiovascular system and associated critical values. Provides general exercise guidelines with specific guidelines to those with heart or lung disease.    Cardiac Rehab from 07/18/2018 in [facility] Cardiac and Pulmonary Rehab  Date  06/13/18  Educator  [facility]  Instruction Review Code  1- Verbalizes Understanding      Aerobic Exercise & Resistance Training: - Gives group verbal and written instruction on the various components of exercise. Focuses on aerobic and resistive training programs and the benefits of this training and how to safely progress through these programs..   Flexibility, Balance, Mind/Body Relaxation: Provides group verbal/written instruction on the benefits of flexibility and balance training, including mind/body exercise modes such as yoga, pilates and tai chi.  Demonstration and skill practice provided.   Cardiac Rehab from 07/18/2018 in [facility] Cardiac and Pulmonary Rehab  Date  06/20/18  Educator  [facility]  Instruction Review Code  1- Verbalizes Understanding      Stress and Anxiety: - Provides group verbal and written instruction about the health risks of elevated stress and causes of high stress.  Discuss the correlation between heart/lung disease and anxiety and treatment options. Review healthy ways to manage with stress and anxiety.   Depression: - Provides group verbal and written instruction on the correlation between heart/lung disease and depressed mood, treatment options, and the stigmas associated with seeking treatment.   Anatomy & Physiology of the Heart: - Group verbal and written instruction and models provide  basic cardiac anatomy and physiology, with the coronary electrical and arterial systems. Review of Valvular disease and Heart Failure   Cardiac Rehab from 07/18/2018 in [facility] Cardiac and Pulmonary Rehab  Date  06/27/18  Educator  SB  Instruction Review Code  1- Verbalizes Understanding      Cardiac Procedures: - Group verbal and written instruction to review commonly prescribed medications for heart disease. Reviews the medication, class of the drug, and side effects. Includes the steps to properly store meds and maintain the prescription regimen. (beta blockers and nitrates)   Cardiac Medications I: - Group verbal and written instruction to review commonly prescribed medications for heart disease. Reviews the medication, class of the drug, and side effects. Includes the steps to properly store meds and maintain the prescription regimen.   Cardiac Medications II: -Group verbal and written instruction to review commonly prescribed medications for heart disease. Reviews the medication, class of the drug, and side effects. (all other drug classes)    Go Sex-Intimacy & Heart Disease, Get SMART - Goal Setting: - Group verbal and written instruction through game format to discuss heart disease and the return to sexual intimacy. Provides group verbal and written material to discuss and apply goal setting through the application of the S.M.A.R.T. Method.   Other Matters of the Heart: - Provides group verbal,  written materials and models to describe Stable Angina and Peripheral Artery. Includes description of the disease process and treatment options available to the cardiac patient.   Cardiac Rehab from 07/18/2018 in [facility] Cardiac and Pulmonary Rehab  Date  06/27/18  Educator  SB  Instruction Review Code  1- Verbalizes Understanding      Exercise & Equipment Safety: - Individual verbal instruction and demonstration of equipment use and safety with use of the equipment.   Cardiac Rehab from  07/18/2018 in [facility] Cardiac and Pulmonary Rehab  Date  05/30/18  Educator  [facility]  Instruction Review Code  1- Verbalizes Understanding      Infection Prevention: - Provides verbal and written material to individual with discussion of infection control including proper hand washing and proper equipment cleaning during exercise session.   Cardiac Rehab from 07/18/2018 in [facility] Cardiac and Pulmonary Rehab  Date  05/30/18  Educator  [facility]  Instruction Review Code  1- Verbalizes Understanding      Falls Prevention: - Provides verbal and written material to individual with discussion of falls prevention and safety.   Cardiac Rehab from 07/18/2018 in [facility] Cardiac and Pulmonary Rehab  Date  05/30/18  Educator  [facility]  Instruction Review Code  1- Verbalizes Understanding      Diabetes: - Individual verbal and written instruction to review signs/symptoms of diabetes, desired ranges of glucose level fasting, after meals and with exercise. Acknowledge that pre and post exercise glucose checks will be done for 3 sessions at entry of program.   Cardiac Rehab from 07/18/2018 in [facility] Cardiac and Pulmonary Rehab  Date  05/30/18  Educator  [facility]  Instruction Review Code  1- Verbalizes Understanding      Know Your Numbers and Risk Factors: -Group verbal and written instruction about important numbers in your health.  Discussion of what are risk factors and how they play a role in the disease process.  Review of Cholesterol, Blood Pressure, Diabetes, and BMI and the role they play in your overall health.   Sleep Hygiene: -Provides group verbal and written instruction about how sleep can affect your health.  Define sleep hygiene, discuss sleep cycles and impact of sleep habits. Review good sleep hygiene tips.    Other: -Provides group and verbal instruction on various topics (see comments)   Knowledge Questionnaire Score: Knowledge Questionnaire Score - 05/30/18 1250      Knowledge Questionnaire Score    Pre Score  23/26   correct answers reviewed wtih Elta [place]. Focus on anatomy and exercise      Core Components/Risk Factors/Patient Goals at Admission: Personal Goals and Risk Factors at Admission - 05/30/18 1239      Core Components/Risk Factors/Patient Goals on Admission    Weight Management  Yes;Weight Loss    Intervention  Weight Management: Develop a combined nutrition and exercise program designed to reach desired caloric intake, while maintaining appropriate intake of nutrient and fiber, sodium and fats, and appropriate energy expenditure required for the weight goal.;Weight Management: Provide education and appropriate resources to help participant work on and attain dietary goals.;Weight Management/Obesity: Establish reasonable short term and long term weight goals.    Admit Weight  190 lb 14.4 oz (86.6 kg)    Goal Weight: Short Term  185 lb (83.9 kg)    Goal Weight: Long Term  180 lb (81.6 kg)    Expected Outcomes  Short Term: Continue to assess and modify interventions until short term weight is achieved;Long Term: Adherence to nutrition and physical  activity/exercise program aimed toward attainment of established weight goal;Weight Loss: Understanding of general recommendations for a balanced deficit meal plan, which promotes 1-2 lb weight loss per week and includes a negative energy balance of [id number] kcal/d;Understanding recommendations for meals to include 15-35% energy as protein, 25-35% energy from fat, 35-60% energy from carbohydrates, less than 251m of dietary cholesterol, 20-35 gm of total fiber daily;Understanding of distribution of calorie intake throughout the day with the consumption of 4-5 meals/snacks    Diabetes  Yes    Intervention  Provide education about signs/symptoms and action to take for hypo/hyperglycemia.;Provide education about proper nutrition, including hydration, and aerobic/resistive exercise prescription along with prescribed medications to achieve blood glucose  in normal ranges: Fasting glucose 65-99 mg/dL    Expected Outcomes  Short Term: Participant verbalizes understanding of the signs/symptoms and immediate care of hyper/hypoglycemia, proper foot care and importance of medication, aerobic/resistive exercise and nutrition plan for blood glucose control.;Long Term: Attainment of HbA1C < 7%.    Hypertension  Yes    Intervention  Provide education on lifestyle modifcations including regular physical activity/exercise, weight management, moderate sodium restriction and increased consumption of fresh fruit, vegetables, and low fat dairy, alcohol moderation, and smoking cessation.;Monitor prescription use compliance.    Expected Outcomes  Short Term: Continued assessment and intervention until BP is < 140/930mHG in hypertensive participants. < 130/8017mG in hypertensive participants with diabetes, heart failure or chronic kidney disease.;Long Term: Maintenance of blood pressure at goal levels.    Lipids  Yes    Intervention  Provide education and support for participant on nutrition & aerobic/resistive exercise along with prescribed medications to achieve LDL <66m69mDL >40mg42m Expected Outcomes  Short Term: Participant states understanding of desired cholesterol values and is compliant with medications prescribed. Participant is following exercise prescription and nutrition guidelines.;Long Term: Cholesterol controlled with medications as prescribed, with individualized exercise RX and with personalized nutrition plan. Value goals: LDL < 66mg,34m > 40 mg.       Core Components/Risk Factors/Patient Goals Review:  Goals and Risk Factor Review    Row Name 07/18/18 0840             Core Components/Risk Factors/Patient Goals Review   Personal Goals Review  Weight Management/Obesity;Diabetes;Hypertension;Lipids       Review  Quentin hTaiwoeen doing well with his weight. It has stayed steady around 186lbs.  His sugars have been good.  He checks them about four  times a day.  He also checks his blood pressures a few times a week at home and they have been running.  He is doing well on his medications as well.        Expected Outcomes  Short: Continue to work on weight loss with diet.  Long: Continue to monitor risk factors.           Core Components/Risk Factors/Patient Goals at Discharge (Final Review):  Goals and Risk Factor Review - 07/18/18 0840      Core Components/Risk Factors/Patient Goals Review   Personal Goals Review  Weight Management/Obesity;Diabetes;Hypertension;Lipids    Review  Kahiau hFerdeen doing well with his weight. It has stayed steady around 186lbs.  His sugars have been good.  He checks them about four times a day.  He also checks his blood pressures a few times a week at home and they have been running.  He is doing well on his medications as well.     Expected Outcomes  Short: Continue  to work on weight loss with diet.  Long: Continue to monitor risk factors.        ITP Comments: ITP Comments    Row Name 05/30/18 1221 06/22/18 0928 07/14/18 0756 07/20/18 0556     ITP Comments  Med Review completed. Initial ITP created. Diagnosis can be found in [facility] 12/4  30 Day Review. Continue with ITP unless directed changes per Medical Director review.  Tramond was out on vacation last week.  He now having issues with anemia and has gotten a transfusion.   We will continue to monitor.   30 day review. Continue with ITP unless directed changes by Medical Director chart review.       Comments:

## 2018-07-22 ENCOUNTER — Encounter: Payer: PPO | Admitting: *Deleted

## 2018-07-22 DIAGNOSIS — Z955 Presence of coronary angioplasty implant and graft: Secondary | ICD-10-CM | POA: Diagnosis not present

## 2018-07-22 NOTE — Progress Notes (Signed)
Daily Session Note  Patient Details  Name: Seth Hardy MRN: [id number] Date of Birth: [birth year] Referring Provider:     Cardiac Rehab from 05/30/2018 in [facility] Cardiac and Pulmonary Rehab  Referring Provider  Kathlyn Sacramento MD      Encounter Date: 07/22/2018  Check In: Session Check In - 07/22/18 0845      Check-In   Supervising physician immediately available to respond to emergencies  See telemetry face sheet for immediately available ER MD    Location  [facility]-Cardiac & Pulmonary Rehab    Staff Present  Darel Hong, RN BSN;Meredith Sherryll Burger, RN BSN;Jessica Hawkins, MA, RCEP, CCRP, Exercise Physiologist    Medication changes reported      No    Fall or balance concerns reported     No    Tobacco Cessation  No Change    Warm-up and Cool-down  Performed as group-led instruction    Resistance Training Performed  Yes    VAD Patient?  No    PAD/SET Patient?  No      Pain Assessment   Currently in Pain?  No/denies          Social History   Tobacco Use  Smoking Status Never Smoker  Smokeless Tobacco Never Used  Tobacco Comment   tobacco use - no.no passive smoke in home    Goals Met:  Independence with exercise equipment Exercise tolerated well No report of cardiac concerns or symptoms Strength training completed today  Goals Unmet:  Not Applicable  Comments: Pt able to follow exercise prescription today without complaint.  Will continue to monitor for progression.    Dr. Emily Filbert is Medical Director for [place] and [facility].

## 2018-07-25 ENCOUNTER — Encounter: Payer: PPO | Attending: Cardiovascular Disease

## 2018-07-25 DIAGNOSIS — Z955 Presence of coronary angioplasty implant and graft: Secondary | ICD-10-CM | POA: Insufficient documentation

## 2018-08-01 ENCOUNTER — Encounter: Payer: PPO | Admitting: *Deleted

## 2018-08-01 DIAGNOSIS — Z955 Presence of coronary angioplasty implant and graft: Secondary | ICD-10-CM

## 2018-08-01 NOTE — Progress Notes (Signed)
Daily Session Note  Patient Details  Name: Seth Hardy MRN: [id number] Date of Birth: 08-Jul-1945 Referring Provider:     Cardiac Rehab from 05/30/2018 in [facility] Cardiac and Pulmonary Rehab  Referring Provider  Seth Sacramento MD      Encounter Date: 08/01/2018  Check In: Session Check In - 08/01/18 0756      Check-In   Supervising physician immediately available to respond to emergencies  See telemetry face sheet for immediately available ER MD    Location  [facility]-Cardiac & Pulmonary Rehab    Staff Present  Earlean Shawl, BS, ACSM CEP, Exercise Physiologist;Jessica Luan Pulling, MA, RCEP, CCRP, Exercise Physiologist;Susanne Bice, RN, BSN, CCRP    Medication changes reported      No    Fall or balance concerns reported     No    Tobacco Cessation  No Change    Warm-up and Cool-down  Performed as group-led instruction    Resistance Training Performed  Yes    VAD Patient?  No    PAD/SET Patient?  No      Pain Assessment   Currently in Pain?  No/denies    Multiple Pain Sites  No          Social History   Tobacco Use  Smoking Status Never Smoker  Smokeless Tobacco Never Used  Tobacco Comment   tobacco use - no.no passive smoke in home    Goals Met:  Independence with exercise equipment Exercise tolerated well No report of cardiac concerns or symptoms Strength training completed today  Goals Unmet:  Not Applicable  Comments: Pt able to follow exercise prescription today without complaint.  Will continue to monitor for progression.    Dr. Emily Filbert is Medical Director for [place] and [facility].

## 2018-08-05 ENCOUNTER — Encounter: Payer: PPO | Admitting: *Deleted

## 2018-08-05 ENCOUNTER — Other Ambulatory Visit: Payer: Self-pay

## 2018-08-05 DIAGNOSIS — Z955 Presence of coronary angioplasty implant and graft: Secondary | ICD-10-CM | POA: Diagnosis not present

## 2018-08-05 NOTE — Progress Notes (Signed)
Daily Session Note  Patient Details  Name: Seth Hardy MRN: [id number] Date of Birth: 07-02-1945 Referring Provider:     Cardiac Rehab from 05/30/2018 in [facility] Cardiac and Pulmonary Rehab  Referring Provider  Kathlyn Sacramento MD      Encounter Date: 08/05/2018  Check In: Session Check In - 08/05/18 0758      Check-In   Supervising physician immediately available to respond to emergencies  See telemetry face sheet for immediately available ER MD    Location  [facility]-Cardiac & Pulmonary Rehab    Staff Present  Darel Hong, RN BSN;Jessica Luan Pulling, MA, RCEP, CCRP, Exercise Physiologist;Amanda Oletta Darter, [place], ACSM CEP, Exercise Physiologist    Medication changes reported      No    Fall or balance concerns reported     No    Tobacco Cessation  No Change    Warm-up and Cool-down  Performed as group-led instruction    Resistance Training Performed  Yes    VAD Patient?  No    PAD/SET Patient?  No      Pain Assessment   Currently in Pain?  No/denies          Social History   Tobacco Use  Smoking Status Never Smoker  Smokeless Tobacco Never Used  Tobacco Comment   tobacco use - no.no passive smoke in home    Goals Met:  Independence with exercise equipment Exercise tolerated well No report of cardiac concerns or symptoms Strength training completed today  Goals Unmet:  Not Applicable  Comments: Pt able to follow exercise prescription today without complaint.  Will continue to monitor for progression.    Dr. Emily Filbert is Medical Director for [place] and [facility].

## 2018-08-08 ENCOUNTER — Ambulatory Visit (INDEPENDENT_AMBULATORY_CARE_PROVIDER_SITE_OTHER): Payer: PPO

## 2018-08-08 ENCOUNTER — Other Ambulatory Visit: Payer: Self-pay

## 2018-08-08 DIAGNOSIS — I4891 Unspecified atrial fibrillation: Secondary | ICD-10-CM | POA: Diagnosis not present

## 2018-08-08 DIAGNOSIS — Z7901 Long term (current) use of anticoagulants: Secondary | ICD-10-CM | POA: Diagnosis not present

## 2018-08-08 LAB — POCT INR: INR: 2.9 (ref 2.0–3.0)

## 2018-08-08 NOTE — Patient Instructions (Signed)
Please continue dosage of 1 tablet daily except 1/2 tablet on TUESDAYS, THURSDAYS & SATURDAYS.   Be consistent w/ your greens intake - pick day(s) each week to have your greens and have them every week on that day(s). Recheck INR in 4 weeks.

## 2018-08-11 ENCOUNTER — Other Ambulatory Visit: Payer: Self-pay | Admitting: Cardiovascular Disease

## 2018-08-17 ENCOUNTER — Encounter: Payer: Self-pay | Admitting: *Deleted

## 2018-08-17 DIAGNOSIS — Z955 Presence of coronary angioplasty implant and graft: Secondary | ICD-10-CM

## 2018-08-17 NOTE — Progress Notes (Signed)
Cardiac Individual Treatment Plan  Patient Details  Name: Seth Hardy MRN: [id number] Date of Birth: Nov 25, 1945 Referring Provider:     Cardiac Rehab from 05/30/2018 in [facility] Cardiac and Pulmonary Rehab  Referring Provider  Kathlyn Sacramento MD      Initial Encounter Date:    Cardiac Rehab from 05/30/2018 in [facility] Cardiac and Pulmonary Rehab  Date  05/30/18      Visit Diagnosis: Status post coronary artery stent placement  Patient's Home Medications on Admission:  Current Outpatient Medications:  .  acetaminophen (TYLENOL) 500 MG tablet, Take 1,000 mg by mouth every 6 (six) hours as needed for moderate pain or headache., Disp: , Rfl:  .  ALPRAZolam (XANAX) 0.25 MG tablet, Take 0.25 mg by mouth daily as needed for anxiety., Disp: , Rfl:  .  amiodarone (PACERONE) 200 MG tablet, TAKE 1/2 TABLET AT BEDTIME (Patient taking differently: Take 100 mg by mouth every evening. ), Disp: 45 tablet, Rfl: 0 .  amLODipine (NORVASC) 5 MG tablet, Take one tablet by mouth one time daily, Disp: 90 tablet, Rfl: 3 .  clopidogrel (PLAVIX) 75 MG tablet, TAKE 4 TABLETS (300MG) ON DAY 1 THEN 1 TABLET EVERY DAY, Disp: 90 tablet, Rfl: 2 .  Cyanocobalamin (B-12) 1000 MCG CAPS, Take 1,000 mcg by mouth every evening. , Disp: , Rfl:  .  Ferrous Gluconate-C-Folic Acid (IRON-C PO), Take 1 tablet by mouth daily., Disp: , Rfl:  .  furosemide (LASIX) 20 MG tablet, Take 1 tablet (20 mg total) by mouth daily. (Patient taking differently: Take 20 mg by mouth daily. Patient taking prn), Disp: 30 tablet, Rfl: 6 .  glimepiride (AMARYL) 4 MG tablet, Take 4 mg by mouth daily. , Disp: , Rfl:  .  insulin glargine (LANTUS) 100 UNIT/ML injection, Inject 20 Units into the skin daily. , Disp: , Rfl:  .  metFORMIN (GLUCOPHAGE) 500 MG tablet, Take 500 mg by mouth 2 (two) times daily with a meal. , Disp: , Rfl:  .  NITROSTAT 0.4 MG SL tablet, Place 1 tablet (0.4 mg total) under the tongue every 5 (five) minutes as needed for chest pain.,  Disp: 25 tablet, Rfl: 1 .  NOVOLOG FLEXPEN 100 UNIT/ML FlexPen, Inject 6-36 Units into the skin 2 (two) times daily with a meal. , Disp: , Rfl:  .  omega-3 acid ethyl esters (LOVAZA) 1 g capsule, Take 1 capsule (1 g total) by mouth daily., Disp: 90 capsule, Rfl: 4 .  Omega-3 Fatty Acids (FISH OIL) 1000 MG CAPS, Take 1,000 mg by mouth every evening., Disp: , Rfl:  .  OXYGEN, Inhale 2.5 L/min into the lungs at bedtime. With CPAP, Disp: , Rfl:  .  pantoprazole (PROTONIX) 40 MG tablet, Take 1 tablet (40 mg total) by mouth daily., Disp: 90 tablet, Rfl: 3 .  Polyethylene Glycol 400 (BLINK TEARS OP), Place 1 drop into both eyes daily as needed (dry eyes)., Disp: , Rfl:  .  ramipril (ALTACE) 10 MG capsule, Take 10 mg by mouth daily. , Disp: , Rfl:  .  rosuvastatin (CRESTOR) 40 MG tablet, Take 1 tablet (40 mg total) by mouth daily., Disp: 90 tablet, Rfl: 3 .  vitamin C (ASCORBIC ACID) 500 MG tablet, Take 500 mg by mouth every evening., Disp: , Rfl:  .  warfarin (COUMADIN) 5 MG tablet, Take 2.5-5 mg by mouth See admin instructions. Take 5 mg at night on Sun, Mon, Wed, Thur, and Fri.  Take 2.5 mg at night on Tues and Sat, Disp: ,  Rfl:   Past Medical History: Past Medical History:  Diagnosis Date  . Anemia 08/2015   received 2 units rbc one week ago, 3 IV iron infusion -last 1 week.  . Basal cell carcinoma of skin 2012   removed several spots from arms and back  . CAD (coronary artery disease)    a. 01/2010 CABG x 4: LIMA->LAD, VG->D1, VG->OM1, VG->PDA. b. NSTEMI 02/2012 in setting of AF-RVR with cath s/p BMS to RCA (plan for 1 month, possibly up to 3 months of Plavix)  . Carotid disease, bilateral ([place])    a. 03/2011 U/S: 40-59% bilat Carotid dzs;  b. 04/2015 Carotid U/S: 40-59% bilat ICA stenosis; c. 05/2015 CTA Neck: 31% RICA, 49% LICA, mod-marked R vertebral stenosis, mod L vertebral stenosis.  . Chronic kidney disease    small obtusion per pcp. ultrasound done on 08/29/2015  . Diabetes mellitus type II,  controlled ([place])    a. Variable CBG 02/2012 - several meds adjusted.  Marland Kitchen Dysrhythmia    intermittent Atrial Fibrillation  . GERD (gastroesophageal reflux disease)   . Heart murmur   . Hyperlipidemia   . Hypertension   . Hypertensive heart disease   . Iron deficiency anemia    "gets infusions q once in awhile" (04/27/2018)  . Macular edema bil   lazer work done previously  . Mild aortic stenosis    a. 03/2011 Echo: EF 55-60%, Mild AS. b. Mild by cath 02/2012; c. 01/2014 Echo: EF 65-70%, Gr 1 DD, mild AS, mildly dil LA.  . Multiple gastric ulcers 2006  . Myocardial infarction ([place]) 02/2012   stents (x1) at time  . Myocardial infarction [facility])    "2nd one was after 02/2012; don't know date" (04/27/2018)  . Obesity   . On home oxygen therapy    "2.5 w/CPAP" (04/27/2018)  . OSA on CPAP    CPAP settings 3 with oxygen 2.5 (04/27/2018)  . PAF (paroxysmal atrial fibrillation) ([place])    a. Newly dx 02/2012 & initiated on Coumadin (spont converted to NSR).  . Shortness of breath dyspnea   . Transfusion history    transfusions -1 month ago -2 units    Tobacco Use: Social History   Tobacco Use  Smoking Status Never Smoker  Smokeless Tobacco Never Used  Tobacco Comment   tobacco use - no.no passive smoke in home    Labs: Recent Review Flowsheet Data    Labs for ITP Cardiac and Pulmonary Rehab Latest Ref Rng & Units 02/17/2010 02/17/2010 02/17/2010 03/08/2012 03/09/2012   Cholestrol 0 - 200 mg/dL - - - - 99   LDLCALC 0 - 99 mg/dL - - - - 45   HDL >39 mg/dL - - - - 37(L)   Trlycerides <150 mg/dL - - - - 84   Hemoglobin A1c <5.7 % - - - - -   PHART 7.350 - 7.450 - 7.341(L) 7.379 - -   PCO2ART 35.0 - 45.0 mmHg - 40.6 36.7 - -   HCO3 20.0 - 24.0 mEq/L - 21.8 21.5 - -   TCO2 0 - 100 mmol/L 22 23 23 27  -   ACIDBASEDEF 0.0 - 2.0 mmol/L - 3.0(H) 3.0(H) - -   O2SAT % - 98.0 95.0 - -       Exercise Target Goals: Exercise Program Goal: Individual exercise prescription set using results from  initial 6 min walk test and THRR while considering  patient's activity barriers and safety.   Exercise Prescription Goal: Initial exercise prescription builds to  30-45 minutes a day of aerobic activity, 2-3 days per week.  Home exercise guidelines will be given to patient during program as part of exercise prescription that the participant will acknowledge.  Activity Barriers & Risk Stratification: Activity Barriers & Cardiac Risk Stratification - 05/30/18 1258      Activity Barriers & Cardiac Risk Stratification   Activity Barriers  None    Cardiac Risk Stratification  High       6 Minute Walk: 6 Minute Walk    Row Name 05/30/18 1348         6 Minute Walk   Phase  Initial     Distance  1530 feet     Walk Time  6 minutes     # of Rest Breaks  0     MPH  2.9     METS  3.48     RPE  11     VO2 Peak  12.19     Symptoms  No     Resting HR  48 bpm     Resting BP  136/64     Resting Oxygen Saturation   100 %     Exercise Oxygen Saturation  during 6 min walk  98 %     Max Ex. HR  114 bpm     Max Ex. BP  166/74     2 Minute Post BP  146/74        Oxygen Initial Assessment:   Oxygen Re-Evaluation:   Oxygen Discharge (Final Oxygen Re-Evaluation):   Initial Exercise Prescription: Initial Exercise Prescription - 05/30/18 1300      Date of Initial Exercise RX and Referring Provider   Date  05/30/18    Referring Provider  Kathlyn Sacramento MD      Treadmill   MPH  2.8    Grade  0.5    Minutes  15    METs  3.34      NuStep   Level  4    SPM  80    Minutes  15    METs  3      Recumbant Elliptical   Level  2    RPM  50    Minutes  15    METs  3      Prescription Details   Frequency (times per week)  2    Duration  Progress to 45 minutes of aerobic exercise without signs/symptoms of physical distress      Intensity   THRR 40-80% of Max Heartrate  88-128    Ratings of Perceived Exertion  11-13    Perceived Dyspnea  0-4      Progression   Progression   Continue to progress workloads to maintain intensity without signs/symptoms of physical distress.      Resistance Training   Training Prescription  Yes    Weight  4 lbs    Reps  10-15       Perform Capillary Blood Glucose checks as needed.  Exercise Prescription Changes: Exercise Prescription Changes    Row Name 05/30/18 1300 06/13/18 0900 06/28/18 0900 07/14/18 0800 07/27/18 1500     Response to Exercise   Blood Pressure (Admit)  136/64  122/64  124/70  130/58  122/62   Blood Pressure (Exercise)  166/74  144/82  126/70  148/60  128/70   Blood Pressure (Exit)  146/74  130/60  130/70  128/62  124/62   Heart Rate (Admit)  48 bpm  47 bpm  88 bpm  68 bpm  61 bpm   Heart Rate (Exercise)  114 bpm  85 bpm  80 bpm  74 bpm  104 bpm   Heart Rate (Exit)  66 bpm  51 bpm  52 bpm  58 bpm  60 bpm   Oxygen Saturation (Admit)  100 %  -  -  -  -   Oxygen Saturation (Exercise)  98 %  -  -  -  -   Rating of Perceived Exertion (Exercise)  11  12  12  14  14    Symptoms  none  none  none  none  none   Comments  walk test results  -  -  -  -   Duration  -  Progress to 45 minutes of aerobic exercise without signs/symptoms of physical distress  Continue with 45 min of aerobic exercise without signs/symptoms of physical distress.  Continue with 45 min of aerobic exercise without signs/symptoms of physical distress.  Continue with 45 min of aerobic exercise without signs/symptoms of physical distress.   Intensity  -  THRR unchanged  THRR unchanged  THRR unchanged  THRR unchanged     Progression   Progression  -  Continue to progress workloads to maintain intensity without signs/symptoms of physical distress.  Continue to progress workloads to maintain intensity without signs/symptoms of physical distress.  Continue to progress workloads to maintain intensity without signs/symptoms of physical distress.  Continue to progress workloads to maintain intensity without signs/symptoms of physical distress.   Average  METs  -  3.03  3.19  2.87  2.81     Resistance Training   Training Prescription  -  Yes  Yes  Yes  Yes   Weight  -  4 lbs  4 lbs  4 lbs  4 lbs   Reps  -  10-15  10-15  10-15  10-15     Interval Training   Interval Training  -  No  No  No  No     Treadmill   MPH  -  2.4  2.4  2.4  2.4   Grade  -  0.5  0.5  0.5  0.5   Minutes  -  15  15  15  15    METs  -  3  3  3  3      Recumbant Bike   Level  -  3  3  3  3    Watts  -  33  27  -  22   Minutes  -  15  15  15  15    METs  -  3.47  3.47  3.1  2.76     NuStep   Level  -  5  5  5  5    Minutes  -  15  15  15  15    METs  -  2.8  3.1  2.5  2.7     Home Exercise Plan   Plans to continue exercise at  -  Home (comment) walking  Home (comment) walking  Home (comment) walking  Home (comment) walking   Frequency  -  Add 2 additional days to program exercise sessions.  Add 2 additional days to program exercise sessions.  Add 2 additional days to program exercise sessions.  Add 2 additional days to program exercise sessions.   Initial Home Exercises Provided  -  06/13/18  06/13/18  06/13/18  06/13/18   Row Name 08/09/18 1500  Response to Exercise   Blood Pressure (Admit)  134/72       Blood Pressure (Exercise)  144/54       Blood Pressure (Exit)  126/74       Heart Rate (Admit)  55 bpm       Heart Rate (Exercise)  83 bpm       Heart Rate (Exit)  51 bpm       Rating of Perceived Exertion (Exercise)  14       Symptoms  none       Duration  Continue with 45 min of aerobic exercise without signs/symptoms of physical distress.       Intensity  THRR unchanged         Progression   Progression  Continue to progress workloads to maintain intensity without signs/symptoms of physical distress.       Average METs  2.94         Resistance Training   Training Prescription  Yes       Weight  4 lbs       Reps  10-15         Interval Training   Interval Training  No         Treadmill   MPH  2.4       Grade  0.5       Minutes   15       METs  3         Recumbant Bike   Level  3       Watts  20       Minutes  15       METs  2.76         NuStep   Level  5       Minutes  15       METs  3.1         Home Exercise Plan   Plans to continue exercise at  Home (comment) walking       Frequency  Add 2 additional days to program exercise sessions.       Initial Home Exercises Provided  06/13/18          Exercise Comments: Exercise Comments    Row Name 06/03/18 2863           Exercise Comments  First full day of exercise!  Patient was oriented to gym and equipment including functions, settings, policies, and procedures.  Patient's individual exercise prescription and treatment plan were reviewed.  All starting workloads were established based on the results of the 6 minute walk test done at initial orientation visit.  The plan for exercise progression was also introduced and progression will be customized based on patient's performance and goals.          Exercise Goals and Review: Exercise Goals    Row Name 05/30/18 1351             Exercise Goals   Increase Physical Activity  Yes       Intervention  Provide advice, education, support and counseling about physical activity/exercise needs.;Develop an individualized exercise prescription for aerobic and resistive training based on initial evaluation findings, risk stratification, comorbidities and participant's personal goals.       Expected Outcomes  Short Term: Attend rehab on a regular basis to increase amount of physical activity.;Long Term: Add in home exercise to make exercise part of routine and to increase amount of physical activity.;Long Term: Exercising  regularly at least 3-5 days a week.       Increase Strength and Stamina  Yes       Intervention  Provide advice, education, support and counseling about physical activity/exercise needs.;Develop an individualized exercise prescription for aerobic and resistive training based on initial evaluation  findings, risk stratification, comorbidities and participant's personal goals.       Expected Outcomes  Short Term: Increase workloads from initial exercise prescription for resistance, speed, and METs.;Short Term: Perform resistance training exercises routinely during rehab and add in resistance training at home;Long Term: Improve cardiorespiratory fitness, muscular endurance and strength as measured by increased METs and functional capacity (6MWT)       Able to understand and use rate of perceived exertion (RPE) scale  Yes       Intervention  Provide education and explanation on how to use RPE scale       Expected Outcomes  Short Term: Able to use RPE daily in rehab to express subjective intensity level;Long Term:  Able to use RPE to guide intensity level when exercising independently       Knowledge and understanding of Target Heart Rate Range (THRR)  Yes       Intervention  Provide education and explanation of THRR including how the numbers were predicted and where they are located for reference       Expected Outcomes  Short Term: Able to state/look up THRR;Short Term: Able to use daily as guideline for intensity in rehab;Long Term: Able to use THRR to govern intensity when exercising independently       Able to check pulse independently  Yes       Intervention  Provide education and demonstration on how to check pulse in carotid and radial arteries.;Review the importance of being able to check your own pulse for safety during independent exercise       Expected Outcomes  Short Term: Able to explain why pulse checking is important during independent exercise;Long Term: Able to check pulse independently and accurately       Understanding of Exercise Prescription  Yes       Intervention  Provide education, explanation, and written materials on patient's individual exercise prescription       Expected Outcomes  Short Term: Able to explain program exercise prescription;Long Term: Able to explain home  exercise prescription to exercise independently          Exercise Goals Re-Evaluation : Exercise Goals Re-Evaluation    Row Name 06/03/18 0752 06/13/18 0908 06/28/18 0929 07/14/18 0800 07/18/18 0834     Exercise Goal Re-Evaluation   Exercise Goals Review  Increase Physical Activity;Increase Strength and Stamina;Knowledge and understanding of Target Heart Rate Range (THRR);Able to understand and use rate of perceived exertion (RPE) scale;Understanding of Exercise Prescription  Increase Physical Activity;Increase Strength and Stamina;Able to understand and use rate of perceived exertion (RPE) scale;Knowledge and understanding of Target Heart Rate Range (THRR);Able to check pulse independently;Understanding of Exercise Prescription  Increase Physical Activity;Increase Strength and Stamina;Understanding of Exercise Prescription  Increase Physical Activity;Increase Strength and Stamina;Understanding of Exercise Prescription  Increase Physical Activity;Increase Strength and Stamina;Understanding of Exercise Prescription   Comments  Reviewed RPE scale, THR and program prescription with pt today.  Pt voiced understanding and was given a copy of goals to take home.   Reviewed home exercise with pt today.  Pt plans to walk at home and use weights for exercise.  Reviewed THR, pulse, RPE, sign and symptoms, NTG use, and when  to call 911 or MD.  Also discussed weather considerations and indoor options.  Pt voiced understanding.  Seth Hardy has been doing well in rehab.  He is now up to 2.4 mph on the treadmill and level 3 on the recumbent bike.  We will continue to monitor his prorgress.   Seth Hardy was out on vacation last week.  He has moved his NuStep up to level 5.  We will continue to monitor his progress.   Seth Hardy has been doing his home exercise for three days a week for 15 min.  He feels like he is getting stronger and has more stamina overall.  We talked building his time at home to 30 min.    Expected Outcomes  Short:  Use RPE daily to regulate intensity. Long: Follow program prescription in THR.  Short: Start to add in walking at home.  Long: Continue to exercise independently  Short: Continue to increase workloads.  Long: Continue to increase strength and stamina.   Short: Add more incline to treadmill.  Long: Continue to increase acitivity levels at home.   Short: Increase time at home to 30 min.  Long: Continue to increase acitivty level.    Seth Hardy Name 07/27/18 1506 08/09/18 1539 08/11/18 1014         Exercise Goal Re-Evaluation   Exercise Goals Review  Increase Physical Activity;Increase Strength and Stamina;Understanding of Exercise Prescription  Increase Physical Activity;Increase Strength and Stamina;Understanding of Exercise Prescription  Increase Physical Activity;Increase Strength and Stamina;Understanding of Exercise Prescription     Comments  Seth Hardy continues to do well in rehab.  He is out on vacation again this week!  He is up to level 5 on the T5 NuStep.  We will continue to monitor his progress.   Seth Hardy is doing well in rehab.  He is up to 2.4 mph on the treadmill.  We will continue to monitor his progress at home.   Seth Hardy is doing some exercise at home.  He has been trying to move around and do some weights.  He has gotten our emails but did not try out the videos yet.  He was encouraged to try videos and to walk more.      Expected Outcomes  Short: Return to attending rehab regularly.  Long: Continue to increase walking time at home.   Short: Continue to walk at home.  Long: Continue to increase strength and stamina.   Short: Try videos and increase walking. Long: Continue to exercise at home.         Discharge Exercise Prescription (Final Exercise Prescription Changes): Exercise Prescription Changes - 08/09/18 1500      Response to Exercise   Blood Pressure (Admit)  134/72    Blood Pressure (Exercise)  144/54    Blood Pressure (Exit)  126/74    Heart Rate (Admit)  55 bpm    Heart Rate (Exercise)   83 bpm    Heart Rate (Exit)  51 bpm    Rating of Perceived Exertion (Exercise)  14    Symptoms  none    Duration  Continue with 45 min of aerobic exercise without signs/symptoms of physical distress.    Intensity  THRR unchanged      Progression   Progression  Continue to progress workloads to maintain intensity without signs/symptoms of physical distress.    Average METs  2.94      Resistance Training   Training Prescription  Yes    Weight  4 lbs    Reps  10-15      Interval Training   Interval Training  No      Treadmill   MPH  2.4    Grade  0.5    Minutes  15    METs  3      Recumbant Bike   Level  3    Watts  20    Minutes  15    METs  2.76      NuStep   Level  5    Minutes  15    METs  3.1      Home Exercise Plan   Plans to continue exercise at  Home (comment)   walking   Frequency  Add 2 additional days to program exercise sessions.    Initial Home Exercises Provided  06/13/18       Nutrition:  Target Goals: Understanding of nutrition guidelines, daily intake of sodium <1515m, cholesterol <2045m calories 30% from fat and 7% or less from saturated fats, daily to have 5 or more servings of fruits and vegetables.  Biometrics: Pre Biometrics - 05/30/18 1352      Pre Biometrics   Height  5' 7"  (1.702 m)    Weight  190 lb 14.4 oz (86.6 kg)    Waist Circumference  40 inches    Hip Circumference  38 inches    Waist to Hip Ratio  1.05 %    BMI (Calculated)  29.89    Single Leg Stand  17.43 seconds        Nutrition Therapy Plan and Nutrition Goals: Nutrition Therapy & Goals - 05/30/18 1253      Intervention Plan   Intervention  Prescribe, educate and counsel regarding individualized specific dietary modifications aiming towards targeted core components such as weight, hypertension, lipid management, diabetes, heart failure and other comorbidities.;Nutrition handout(s) given to patient.    Expected Outcomes  Short Term Goal: Understand basic principles  of dietary content, such as calories, fat, sodium, cholesterol and nutrients.;Short Term Goal: A plan has been developed with personal nutrition goals set during dietitian appointment.;Long Term Goal: Adherence to prescribed nutrition plan.       Nutrition Assessments: Nutrition Assessments - 05/30/18 1253      MEDFICTS Scores   Pre Score  41       Nutrition Goals Re-Evaluation: Nutrition Goals Re-Evaluation    Row Name 07/18/18 [phone]           Goals   Nutrition Goal  Heart Healthy diet, lots of fruits and vegetables, lean meats, low fat, watching salt       Comment  He is trying to watch his diet as much as possible.  He does add salt to his food.  His wife is reading food labels for him.  They feel that they have a good grasp of the diet at this point.  He has been sticking with his lean meats and trying for fruits and vegetables.        Expected Outcome  Short: Continue to watch sodium.  Long: Continue to increase fruits and vegetables.           Nutrition Goals Discharge (Final Nutrition Goals Re-Evaluation): Nutrition Goals Re-Evaluation - 07/18/18 0838      Goals   Nutrition Goal  Heart Healthy diet, lots of fruits and vegetables, lean meats, low fat, watching salt    Comment  He is trying to watch his diet as much as possible.  He does add salt to his  food.  His wife is reading food labels for him.  They feel that they have a good grasp of the diet at this point.  He has been sticking with his lean meats and trying for fruits and vegetables.     Expected Outcome  Short: Continue to watch sodium.  Long: Continue to increase fruits and vegetables.        Psychosocial: Target Goals: Acknowledge presence or absence of significant depression and/or stress, maximize coping skills, provide positive support system. Participant is able to verbalize types and ability to use techniques and skills needed for reducing stress and depression.   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 He enjoys his part time jobs and his family life. He reports good sleep habits and no current stress concerns.     Family Dynamics   Good Support System?  Yes   Wife and sons     Barriers   Psychosocial barriers to participate in program  There are no identifiable barriers or psychosocial needs.;The patient should benefit from training in stress management and relaxation.      Screening Interventions   Interventions  Encouraged to exercise;Program counselor consult;To provide support and resources with identified psychosocial needs;Provide feedback about the scores to participant    Expected Outcomes  Short Term goal: Utilizing psychosocial counselor, staff and physician to assist with identification of specific Stressors or current issues interfering with healing process. Setting desired goal for each stressor or current issue identified.;Long Term Goal: Stressors or current issues are controlled or eliminated.;Short Term goal: Identification and review with participant of any Quality of Life or Depression concerns found by scoring the questionnaire.;Long Term goal: The participant improves quality of Life and PHQ9 Scores as seen by post scores and/or verbalization of changes       Quality of Life Scores:  Quality of Life - 05/30/18 1252      Quality of Life   Select  Quality of Life      Quality of Life Scores   Health/Function Pre  25.88 %    Socioeconomic Pre  30 %    Psych/Spiritual Pre  29.14 %    Family Pre  30 %    GLOBAL Pre  28.08 %      Scores of 19 and below usually indicate a poorer quality of life in these areas.  A difference of  2-3 points is a clinically meaningful difference.  A difference of 2-3 points in the total score of the Quality of Life Index has  been associated with significant improvement in overall quality of life, self-image, physical symptoms, and general health in studies assessing change in quality of life.  PHQ-9: Recent Review Flowsheet Data    Depression screen [facility] 2/9 05/30/2018   Decreased Interest 0   Down, Depressed, Hopeless 0   PHQ - 2 Score 0   Altered sleeping 0   Tired, decreased energy 0   Change in appetite 0   Feeling bad or failure about yourself  0   Trouble concentrating 0   Moving slowly or fidgety/restless 0   Suicidal thoughts 0   PHQ-9 Score 0     Interpretation of Total Score  Total Score Depression Severity:  1-4 = Minimal depression, 5-9 = Mild depression,  10-14 = Moderate depression, 15-19 = Moderately severe depression, 20-27 = Severe depression   Psychosocial Evaluation and Intervention: Psychosocial Evaluation - 06/06/18 0920      Psychosocial Evaluation & Interventions   Interventions  Encouraged to exercise with the program and follow exercise prescription    Comments  Counselor met with Mr. [facility]) today for initial psychosocial evaluation.  He is a 72 year old who has a strong support system with a spouse of 36 years; (2) adult sons; a brother locally and active involvement in a local church community.  Seth Hardy had a stent inserted approximately a month ago and is diabetic.  He is sleeping well and has a good appetite.  Seth Hardy denies a history of depression or anxiety or any current symptoms and reports typically being in a positive mood.  He has minimal stress in his life currently - other than his health conditions.  Seth Hardy has goals to lose weight and increase his stamina and strength while in this program.  [facility].  Staff will monitor Seth Hardy throughout the course of this program.      Expected Outcomes  Short:   Seth Hardy will meet with the dietician to address his weight loss goal.  He will also exercise consistently to increase his stamina and  strength.  Long:  Seth Hardy will maintain healthy life habits; including exercise and diet for his well-being.  [facility] will [facility] to continue these habits.    Continue Psychosocial Services   Follow up required by staff       Psychosocial Re-Evaluation: Psychosocial Re-Evaluation    Marianne Name 07/18/18 0836 08/11/18 1008           Psychosocial Re-Evaluation   Current issues with  Current Stress Concerns  None Identified      Comments  Seth Hardy is doing well mentally. He has been sleeping well.  He was out on vacation and then was feeling a little run down; however, he did not need an iron infusion.  He has noted that the exercise has really helped build his strength back up.   counselor Seth Hardy documenting in [place] session: counselor contacted patient for re-evaluation.  He reported that his goals of weight loss and increased stamina and energy are being met.  No concerns at present.      Expected Outcomes  Short: Continue to attend regularly.   Long: Continue to stay postive.   Short term goal: continue finding ways to exercise outside of program; long term goal: continue building strength and stamina, focusing on positive.      Continue Psychosocial Services   -  Follow up required by staff         Psychosocial Discharge (Final Psychosocial Re-Evaluation): Psychosocial Re-Evaluation - 08/11/18 1008      Psychosocial Re-Evaluation   Current issues with  None Identified    Comments  counselor Seth Hardy documenting in [place] session: counselor contacted patient for re-evaluation.  He reported that his goals of weight loss and increased stamina and energy are being met.  No concerns at present.    Expected Outcomes  Short term goal: continue finding ways to exercise outside of program; long term goal: continue building strength and stamina, focusing on positive.    Continue Psychosocial Services   Follow up required by staff       Vocational Rehabilitation: Provide vocational rehab  assistance to qualifying candidates.   Vocational Rehab Evaluation &  Intervention: Vocational Rehab - 05/30/18 1251      Initial Vocational Rehab Evaluation & Intervention   Assessment shows need for Vocational Rehabilitation  No       Education: Education Goals: Education classes will be provided on a variety of topics geared toward better understanding of heart health and risk factor modification. Participant will state understanding/return demonstration of topics presented as noted by education test scores.  Learning Barriers/Preferences: Learning Barriers/Preferences - 05/30/18 1250      Learning Barriers/Preferences   Learning Barriers  None    Learning Preferences  Individual Instruction       Education Topics:  AED/CPR: - Group verbal and written instruction with the use of models to demonstrate the basic use of the AED with the basic ABC's of resuscitation.   Cardiac Rehab from 08/01/2018 in [facility] Cardiac and Pulmonary Rehab  Date  07/18/18  Educator  SB  Instruction Review Code  1- Verbalizes Understanding      General Nutrition Guidelines/Fats and Fiber: -Group instruction provided by verbal, written material, models and posters to present the general guidelines for heart healthy nutrition. Gives an explanation and review of dietary fats and fiber.   Cardiac Rehab from 08/01/2018 in [facility] Cardiac and Pulmonary Rehab  Date  08/01/18  Educator  [facility]  Instruction Review Code  1- Verbalizes Understanding      Controlling Sodium/Reading Food Labels: -Group verbal and written material supporting the discussion of sodium use in heart healthy nutrition. Review and explanation with models, verbal and written materials for utilization of the food label.   Exercise Physiology & General Exercise Guidelines: - Group verbal and written instruction with models to review the exercise physiology of the cardiovascular system and associated critical values. Provides general exercise  guidelines with specific guidelines to those with heart or lung disease.    Cardiac Rehab from 08/01/2018 in [facility] Cardiac and Pulmonary Rehab  Date  06/13/18  Educator  [facility]  Instruction Review Code  1- Verbalizes Understanding      Aerobic Exercise & Resistance Training: - Gives group verbal and written instruction on the various components of exercise. Focuses on aerobic and resistive training programs and the benefits of this training and how to safely progress through these programs..   Flexibility, Balance, Mind/Body Relaxation: Provides group verbal/written instruction on the benefits of flexibility and balance training, including mind/body exercise modes such as yoga, pilates and tai chi.  Demonstration and skill practice provided.   Cardiac Rehab from 08/01/2018 in [facility] Cardiac and Pulmonary Rehab  Date  06/20/18  Educator  [facility]  Instruction Review Code  1- Verbalizes Understanding      Stress and Anxiety: - Provides group verbal and written instruction about the health risks of elevated stress and causes of high stress.  Discuss the correlation between heart/lung disease and anxiety and treatment options. Review healthy ways to manage with stress and anxiety.   Depression: - Provides group verbal and written instruction on the correlation between heart/lung disease and depressed mood, treatment options, and the stigmas associated with seeking treatment.   Anatomy & Physiology of the Heart: - Group verbal and written instruction and models provide basic cardiac anatomy and physiology, with the coronary electrical and arterial systems. Review of Valvular disease and Heart Failure   Cardiac Rehab from 08/01/2018 in [facility] Cardiac and Pulmonary Rehab  Date  06/27/18  Educator  SB  Instruction Review Code  1- Verbalizes Understanding      Cardiac Procedures: - Group verbal and  written instruction to review commonly prescribed medications for heart disease. Reviews the medication, class  of the drug, and side effects. Includes the steps to properly store meds and maintain the prescription regimen. (beta blockers and nitrates)   Cardiac Medications I: - Group verbal and written instruction to review commonly prescribed medications for heart disease. Reviews the medication, class of the drug, and side effects. Includes the steps to properly store meds and maintain the prescription regimen.   Cardiac Medications II: -Group verbal and written instruction to review commonly prescribed medications for heart disease. Reviews the medication, class of the drug, and side effects. (all other drug classes)    Go Sex-Intimacy & Heart Disease, Get SMART - Goal Setting: - Group verbal and written instruction through game format to discuss heart disease and the return to sexual intimacy. Provides group verbal and written material to discuss and apply goal setting through the application of the S.M.A.R.T. Method.   Other Matters of the Heart: - Provides group verbal, written materials and models to describe Stable Angina and Peripheral Artery. Includes description of the disease process and treatment options available to the cardiac patient.   Cardiac Rehab from 08/01/2018 in [facility] Cardiac and Pulmonary Rehab  Date  06/27/18  Educator  SB  Instruction Review Code  1- Verbalizes Understanding      Exercise & Equipment Safety: - Individual verbal instruction and demonstration of equipment use and safety with use of the equipment.   Cardiac Rehab from 08/01/2018 in [facility] Cardiac and Pulmonary Rehab  Date  05/30/18  Educator  [facility]  Instruction Review Code  1- Verbalizes Understanding      Infection Prevention: - Provides verbal and written material to individual with discussion of infection control including proper hand washing and proper equipment cleaning during exercise session.   Cardiac Rehab from 08/01/2018 in [facility] Cardiac and Pulmonary Rehab  Date  05/30/18  Educator  [facility]  Instruction  Review Code  1- Verbalizes Understanding      Falls Prevention: - Provides verbal and written material to individual with discussion of falls prevention and safety.   Cardiac Rehab from 08/01/2018 in [facility] Cardiac and Pulmonary Rehab  Date  05/30/18  Educator  [facility]  Instruction Review Code  1- Verbalizes Understanding      Diabetes: - Individual verbal and written instruction to review signs/symptoms of diabetes, desired ranges of glucose level fasting, after meals and with exercise. Acknowledge that pre and post exercise glucose checks will be done for 3 sessions at entry of program.   Cardiac Rehab from 08/01/2018 in [facility] Cardiac and Pulmonary Rehab  Date  05/30/18  Educator  [facility]  Instruction Review Code  1- Verbalizes Understanding      Know Your Numbers and Risk Factors: -Group verbal and written instruction about important numbers in your health.  Discussion of what are risk factors and how they play a role in the disease process.  Review of Cholesterol, Blood Pressure, Diabetes, and BMI and the role they play in your overall health.   Sleep Hygiene: -Provides group verbal and written instruction about how sleep can affect your health.  Define sleep hygiene, discuss sleep cycles and impact of sleep habits. Review good sleep hygiene tips.    Other: -Provides group and verbal instruction on various topics (see comments)   Knowledge Questionnaire Score: Knowledge Questionnaire Score - 05/30/18 1250      Knowledge Questionnaire Score   Pre Score  23/26   correct answers reviewed wtih Seth Hardy. Focus  on anatomy and exercise      Core Components/Risk Factors/Patient Goals at Admission: Personal Goals and Risk Factors at Admission - 05/30/18 1239      Core Components/Risk Factors/Patient Goals on Admission    Weight Management  Yes;Weight Loss    Intervention  Weight Management: Develop a combined nutrition and exercise program designed to reach desired caloric intake, while  maintaining appropriate intake of nutrient and fiber, sodium and fats, and appropriate energy expenditure required for the weight goal.;Weight Management: Provide education and appropriate resources to help participant work on and attain dietary goals.;Weight Management/Obesity: Establish reasonable short term and long term weight goals.    Admit Weight  190 lb 14.4 oz (86.6 kg)    Goal Weight: Short Term  185 lb (83.9 kg)    Goal Weight: Long Term  180 lb (81.6 kg)    Expected Outcomes  Short Term: Continue to assess and modify interventions until short term weight is achieved;Long Term: Adherence to nutrition and physical activity/exercise program aimed toward attainment of established weight goal;Weight Loss: Understanding of general recommendations for a balanced deficit meal plan, which promotes 1-2 lb weight loss per week and includes a negative energy balance of [phone] kcal/d;Understanding recommendations for meals to include 15-35% energy as protein, 25-35% energy from fat, 35-60% energy from carbohydrates, less than 233m of dietary cholesterol, 20-35 gm of total fiber daily;Understanding of distribution of calorie intake throughout the day with the consumption of 4-5 meals/snacks    Diabetes  Yes    Intervention  Provide education about signs/symptoms and action to take for hypo/hyperglycemia.;Provide education about proper nutrition, including hydration, and aerobic/resistive exercise prescription along with prescribed medications to achieve blood glucose in normal ranges: Fasting glucose 65-99 mg/dL    Expected Outcomes  Short Term: Participant verbalizes understanding of the signs/symptoms and immediate care of hyper/hypoglycemia, proper foot care and importance of medication, aerobic/resistive exercise and nutrition plan for blood glucose control.;Long Term: Attainment of HbA1C < 7%.    Hypertension  Yes    Intervention  Provide education on lifestyle modifcations including regular physical  activity/exercise, weight management, moderate sodium restriction and increased consumption of fresh fruit, vegetables, and low fat dairy, alcohol moderation, and smoking cessation.;Monitor prescription use compliance.    Expected Outcomes  Short Term: Continued assessment and intervention until BP is < 140/975mHG in hypertensive participants. < 130/8066mG in hypertensive participants with diabetes, heart failure or chronic kidney disease.;Long Term: Maintenance of blood pressure at goal levels.    Lipids  Yes    Intervention  Provide education and support for participant on nutrition & aerobic/resistive exercise along with prescribed medications to achieve LDL <58m55mDL >40mg72m Expected Outcomes  Short Term: Participant states understanding of desired cholesterol values and is compliant with medications prescribed. Participant is following exercise prescription and nutrition guidelines.;Long Term: Cholesterol controlled with medications as prescribed, with individualized exercise RX and with personalized nutrition plan. Value goals: LDL < 58mg,35m > 40 mg.       Core Components/Risk Factors/Patient Goals Review:  Goals and Risk Factor Review    Row Name 07/18/18 0840 08/11/18 1007           Core Components/Risk Factors/Patient Goals Review   Personal Goals Review  Weight Management/Obesity;Diabetes;Hypertension;Lipids  Weight Management/Obesity;Diabetes;Hypertension;Lipids      Review  Beecher hAdemideeen doing well with his weight. It has stayed steady around 186lbs.  His sugars have been good.  He checks them about four times a  day.  He also checks his blood pressures a few times a week at home and they have been running.  He is doing well on his medications as well.   Cary is doing well at home. His weight is still coming down. He was down to 183.8 lbs today.  He is doing well with his blood pressures and continues to check them.  His blood sugars are have been a little more up and down since he  is eating a little differently and not moving as much.  He was encouraged to try ot get more exercise.       Expected Outcomes  Short: Continue to work on weight loss with diet.  Long: Continue to monitor risk factors.   Short: Continue to work on weight loss and sugars with more exercise. Long: Continue to monitor risk factors.          Core Components/Risk Factors/Patient Goals at Discharge (Final Review):  Goals and Risk Factor Review - 08/11/18 1007      Core Components/Risk Factors/Patient Goals Review   Personal Goals Review  Weight Management/Obesity;Diabetes;Hypertension;Lipids    Review  Ishan is doing well at home. His weight is still coming down. He was down to 183.8 lbs today.  He is doing well with his blood pressures and continues to check them.  His blood sugars are have been a little more up and down since he is eating a little differently and not moving as much.  He was encouraged to try ot get more exercise.     Expected Outcomes  Short: Continue to work on weight loss and sugars with more exercise. Long: Continue to monitor risk factors.        ITP Comments: ITP Comments    Row Name 05/30/18 1221 06/22/18 0928 07/14/18 0756 07/20/18 0556 08/17/18 1144   ITP Comments  Med Review completed. Initial ITP created. Diagnosis can be found in [facility] 12/4  30 Day Review. Continue with ITP unless directed changes per Medical Director review.  Haaris was out on vacation last week.  He now having issues with anemia and has gotten a transfusion.   We will continue to monitor.   30 day review. Continue with ITP unless directed changes by Medical Director chart review.   30 day review. Continue with ITP unless directed changes by Medical Director chart review.      Comments:

## 2018-08-23 ENCOUNTER — Encounter: Payer: Self-pay | Admitting: *Deleted

## 2018-08-23 DIAGNOSIS — Z955 Presence of coronary angioplasty implant and graft: Secondary | ICD-10-CM

## 2018-09-02 ENCOUNTER — Telehealth: Payer: Self-pay

## 2018-09-02 NOTE — Telephone Encounter (Signed)

## 2018-09-05 ENCOUNTER — Telehealth: Payer: Self-pay | Admitting: Cardiovascular Disease

## 2018-09-05 ENCOUNTER — Ambulatory Visit (INDEPENDENT_AMBULATORY_CARE_PROVIDER_SITE_OTHER): Payer: PPO

## 2018-09-05 DIAGNOSIS — Z7901 Long term (current) use of anticoagulants: Secondary | ICD-10-CM

## 2018-09-05 DIAGNOSIS — I4891 Unspecified atrial fibrillation: Secondary | ICD-10-CM

## 2018-09-05 LAB — POCT INR: INR: 6.6 — AB (ref 2.0–3.0)

## 2018-09-05 NOTE — Telephone Encounter (Signed)
Pt c/o medication issue:  1. Name of Medication: K+  2. How are you currently taking this medication (dosage and times per day)? No   3. Are you having a reaction (difficulty breathing--STAT)? No   4. What is your medication issue?  Patient wants to know if he can start taking vitamin k+ to help with low levels due to coumadin

## 2018-09-05 NOTE — Telephone Encounter (Signed)
Please see anti-coag note for today. 

## 2018-09-05 NOTE — Patient Instructions (Signed)
Please have a large serving of greens today, skip coumadin tonight, tomorrow & Wednesday, then resume dosage of 1 tablet daily except 1/2 tablet on TUESDAYS, THURSDAYS & SATURDAYS.   Be consistent w/ your greens intake - pick day(s) each week to have your greens and have them every week on that day(s). Recheck INR in 1 week.

## 2018-09-06 ENCOUNTER — Other Ambulatory Visit: Payer: Self-pay

## 2018-09-09 ENCOUNTER — Encounter: Payer: Self-pay | Admitting: *Deleted

## 2018-09-09 DIAGNOSIS — Z955 Presence of coronary angioplasty implant and graft: Secondary | ICD-10-CM

## 2018-09-12 ENCOUNTER — Ambulatory Visit (INDEPENDENT_AMBULATORY_CARE_PROVIDER_SITE_OTHER): Payer: PPO

## 2018-09-12 ENCOUNTER — Other Ambulatory Visit: Payer: Self-pay

## 2018-09-12 DIAGNOSIS — Z5181 Encounter for therapeutic drug level monitoring: Secondary | ICD-10-CM

## 2018-09-12 DIAGNOSIS — Z7901 Long term (current) use of anticoagulants: Secondary | ICD-10-CM

## 2018-09-12 DIAGNOSIS — I4891 Unspecified atrial fibrillation: Secondary | ICD-10-CM | POA: Diagnosis not present

## 2018-09-12 LAB — POCT INR: INR: 2.3 (ref 2.0–3.0)

## 2018-09-12 NOTE — Patient Instructions (Signed)
Please START NEW DOSAGE of 1/2 tablet daily except 1 tablet on MONDAYS, [place].  Recheck INR in 3 weeks.

## 2018-09-27 DIAGNOSIS — E1139 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: Secondary | ICD-10-CM | POA: Diagnosis not present

## 2018-09-27 DIAGNOSIS — E1165 Type 2 diabetes mellitus with hyperglycemia: Secondary | ICD-10-CM | POA: Diagnosis not present

## 2018-09-29 ENCOUNTER — Other Ambulatory Visit: Payer: Self-pay | Admitting: Internal Medicine

## 2018-09-29 DIAGNOSIS — D500 Iron deficiency anemia secondary to blood loss (chronic): Secondary | ICD-10-CM

## 2018-09-29 NOTE — Progress Notes (Signed)
Colette-please schedule IV Feraheme weekly x2;   Brooke-please order CBC in 1 month.  Follow-up with me/labs-cbc/bmp/iron studies-ferritin  possible Feraheme as planned in June-however change the clinic appointment to [place].  Thanks

## 2018-09-30 ENCOUNTER — Other Ambulatory Visit: Payer: Self-pay

## 2018-09-30 ENCOUNTER — Inpatient Hospital Stay: Payer: PPO | Attending: Internal Medicine

## 2018-09-30 ENCOUNTER — Telehealth: Payer: Self-pay

## 2018-09-30 VITALS — BP 149/76 | HR 50 | Temp 98.2°F | Resp 18

## 2018-09-30 DIAGNOSIS — Z79899 Other long term (current) drug therapy: Secondary | ICD-10-CM | POA: Diagnosis not present

## 2018-09-30 DIAGNOSIS — D500 Iron deficiency anemia secondary to blood loss (chronic): Secondary | ICD-10-CM

## 2018-09-30 DIAGNOSIS — D509 Iron deficiency anemia, unspecified: Secondary | ICD-10-CM | POA: Insufficient documentation

## 2018-09-30 MED ORDER — SODIUM CHLORIDE 0.9 % IV SOLN
Freq: Once | INTRAVENOUS | Status: AC
  Administered 2018-09-30: 11:00:00 via INTRAVENOUS
  Filled 2018-09-30: qty 250

## 2018-09-30 MED ORDER — SODIUM CHLORIDE 0.9 % IV SOLN
510.0000 mg | Freq: Once | INTRAVENOUS | Status: AC
  Administered 2018-09-30: 510 mg via INTRAVENOUS
  Filled 2018-09-30: qty 17

## 2018-09-30 NOTE — Addendum Note (Signed)
Addended by: Sabino Gasser on: 09/30/2018 08:48 AM   Modules accepted: Orders

## 2018-09-30 NOTE — Progress Notes (Signed)
Pt declines staying for 30 minute post observations period. Pt and VS stable at discharge.

## 2018-09-30 NOTE — Telephone Encounter (Signed)

## 2018-10-03 ENCOUNTER — Ambulatory Visit (INDEPENDENT_AMBULATORY_CARE_PROVIDER_SITE_OTHER): Payer: PPO

## 2018-10-03 ENCOUNTER — Other Ambulatory Visit: Payer: Self-pay

## 2018-10-03 ENCOUNTER — Encounter: Payer: Self-pay | Admitting: *Deleted

## 2018-10-03 DIAGNOSIS — Z5181 Encounter for therapeutic drug level monitoring: Secondary | ICD-10-CM | POA: Diagnosis not present

## 2018-10-03 DIAGNOSIS — I4891 Unspecified atrial fibrillation: Secondary | ICD-10-CM | POA: Diagnosis not present

## 2018-10-03 DIAGNOSIS — Z7901 Long term (current) use of anticoagulants: Secondary | ICD-10-CM | POA: Diagnosis not present

## 2018-10-03 DIAGNOSIS — Z955 Presence of coronary angioplasty implant and graft: Secondary | ICD-10-CM

## 2018-10-03 LAB — POCT INR: INR: 4.6 — AB (ref 2.0–3.0)

## 2018-10-03 NOTE — Patient Instructions (Signed)
Please skip coumadin tonight and tomorrow, then START NEW DOSAGE of 1/2 tablet daily except 1 tablet on [place].  Recheck INR in 4 weeks.

## 2018-10-04 DIAGNOSIS — G4733 Obstructive sleep apnea (adult) (pediatric): Secondary | ICD-10-CM | POA: Diagnosis not present

## 2018-10-04 DIAGNOSIS — E1165 Type 2 diabetes mellitus with hyperglycemia: Secondary | ICD-10-CM | POA: Diagnosis not present

## 2018-10-04 DIAGNOSIS — Z9989 Dependence on other enabling machines and devices: Secondary | ICD-10-CM | POA: Diagnosis not present

## 2018-10-04 DIAGNOSIS — E538 Deficiency of other specified B group vitamins: Secondary | ICD-10-CM | POA: Diagnosis not present

## 2018-10-04 DIAGNOSIS — Z0000 Encounter for general adult medical examination without abnormal findings: Secondary | ICD-10-CM | POA: Diagnosis not present

## 2018-10-04 DIAGNOSIS — Z125 Encounter for screening for malignant neoplasm of prostate: Secondary | ICD-10-CM | POA: Diagnosis not present

## 2018-10-04 DIAGNOSIS — E1139 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: Secondary | ICD-10-CM | POA: Diagnosis not present

## 2018-10-04 DIAGNOSIS — D500 Iron deficiency anemia secondary to blood loss (chronic): Secondary | ICD-10-CM | POA: Diagnosis not present

## 2018-10-05 ENCOUNTER — Inpatient Hospital Stay: Payer: PPO

## 2018-10-05 ENCOUNTER — Other Ambulatory Visit: Payer: Self-pay

## 2018-10-05 VITALS — BP 130/60 | HR 50 | Temp 97.4°F | Resp 18

## 2018-10-05 DIAGNOSIS — D500 Iron deficiency anemia secondary to blood loss (chronic): Secondary | ICD-10-CM

## 2018-10-05 DIAGNOSIS — G4733 Obstructive sleep apnea (adult) (pediatric): Secondary | ICD-10-CM | POA: Diagnosis not present

## 2018-10-05 DIAGNOSIS — D509 Iron deficiency anemia, unspecified: Secondary | ICD-10-CM | POA: Diagnosis not present

## 2018-10-05 MED ORDER — SODIUM CHLORIDE 0.9 % IV SOLN
510.0000 mg | Freq: Once | INTRAVENOUS | Status: AC
  Administered 2018-10-05: 510 mg via INTRAVENOUS
  Filled 2018-10-05: qty 17

## 2018-10-05 MED ORDER — SODIUM CHLORIDE 0.9 % IV SOLN
Freq: Once | INTRAVENOUS | Status: AC
  Administered 2018-10-05: 11:00:00 via INTRAVENOUS
  Filled 2018-10-05: qty 250

## 2018-10-06 ENCOUNTER — Telehealth: Payer: Self-pay | Admitting: Cardiovascular Disease

## 2018-10-06 NOTE — Telephone Encounter (Signed)
Returned call to pt and reiterated warfarin instructions given after 5/11 encounter. He accidentally took 1 full tablet yesterday instead of 1/2 tablet, so advised pt to take 1/2 tab this Friday and next week start full tab on Mon and Fri. Also addended last anticoag note since calendar was not updated to reflect dose decrease on Wednesday.

## 2018-10-06 NOTE — Telephone Encounter (Signed)
If Home Health RN is calling please get Coumadin Nurse on the phone STAT  1.  Are you calling in regards to an appointment? no  2.  Are you calling for a refill ? No   3.  Are you having bleeding issues? No   4.  Do you need clearance to hold Coumadin? No    Patient needs clarity on instructions for new dose    Please skip coumadin tonight and tomorrow, then START NEW DOSAGE of 1/2 tablet daily except 1 tablet on [place].  Recheck INR in 4 weeks.   Please route to the Coumadin Clinic Pool

## 2018-10-07 ENCOUNTER — Ambulatory Visit: Payer: PPO

## 2018-10-25 ENCOUNTER — Encounter: Payer: Self-pay | Admitting: *Deleted

## 2018-10-25 DIAGNOSIS — Z955 Presence of coronary angioplasty implant and graft: Secondary | ICD-10-CM

## 2018-10-27 ENCOUNTER — Inpatient Hospital Stay: Payer: PPO | Attending: Internal Medicine

## 2018-10-27 ENCOUNTER — Other Ambulatory Visit: Payer: Self-pay

## 2018-10-27 DIAGNOSIS — D500 Iron deficiency anemia secondary to blood loss (chronic): Secondary | ICD-10-CM

## 2018-10-27 LAB — CBC WITH DIFFERENTIAL/PLATELET
Abs Immature Granulocytes: 0 10*3/uL (ref 0.00–0.07)
Basophils Absolute: 0 10*3/uL (ref 0.0–0.1)
Basophils Relative: 1 %
Eosinophils Absolute: 0.1 10*3/uL (ref 0.0–0.5)
Eosinophils Relative: 3 %
HCT: 36.7 % — ABNORMAL LOW (ref 39.0–52.0)
Hemoglobin: 11 g/dL — ABNORMAL LOW (ref 13.0–17.0)
Immature Granulocytes: 0 %
Lymphocytes Relative: 24 %
Lymphs Abs: 0.8 10*3/uL (ref 0.7–4.0)
MCH: 27.2 pg (ref 26.0–34.0)
MCHC: 30 g/dL (ref 30.0–36.0)
MCV: 90.8 fL (ref 80.0–100.0)
Monocytes Absolute: 0.4 10*3/uL (ref 0.1–1.0)
Monocytes Relative: 12 %
Neutro Abs: 1.9 10*3/uL (ref 1.7–7.7)
Neutrophils Relative %: 60 %
Platelets: 152 10*3/uL (ref 150–400)
RBC: 4.04 MIL/uL — ABNORMAL LOW (ref 4.22–5.81)
RDW: 22.6 % — ABNORMAL HIGH (ref 11.5–15.5)
WBC: 3.2 10*3/uL — ABNORMAL LOW (ref 4.0–10.5)
nRBC: 0 % (ref 0.0–0.2)

## 2018-10-28 ENCOUNTER — Telehealth: Payer: Self-pay

## 2018-10-28 NOTE — Telephone Encounter (Signed)

## 2018-10-31 ENCOUNTER — Telehealth: Payer: Self-pay | Admitting: *Deleted

## 2018-10-31 ENCOUNTER — Ambulatory Visit (INDEPENDENT_AMBULATORY_CARE_PROVIDER_SITE_OTHER): Payer: PPO

## 2018-10-31 ENCOUNTER — Other Ambulatory Visit: Payer: Self-pay

## 2018-10-31 DIAGNOSIS — Z7901 Long term (current) use of anticoagulants: Secondary | ICD-10-CM | POA: Diagnosis not present

## 2018-10-31 DIAGNOSIS — I4891 Unspecified atrial fibrillation: Secondary | ICD-10-CM | POA: Diagnosis not present

## 2018-10-31 LAB — POCT INR: INR: 2.3 (ref 2.0–3.0)

## 2018-10-31 NOTE — Telephone Encounter (Signed)
Patient informed no appointment 6/19 and to expect a call from [facility] to reschedule appointment for 2 months from now

## 2018-10-31 NOTE — Telephone Encounter (Signed)
Seth Hardy- its ok to hold off IV iron.  Please move appts to 2 months from now-MD-labs;  Cbc/bmp/possible ferrahem

## 2018-10-31 NOTE — Patient Instructions (Signed)
Please continue dosage of 1/2 tablet daily except 1 tablet on [place].  Recheck INR in 5 weeks.

## 2018-10-31 NOTE — Telephone Encounter (Signed)
Patient called asking what his hgb was on 6/4 lab draw. It was 11.0, there was no other labs other than a CBC drawn and he is asking if he needs to keep the appointment for 6/19 since his hgb is up stating he already had his labs drawn 6/4. His upcoming appointment is lab/ doctor/ Feraheme. Please advise. If he needs to keep appointment, he needs to move it up because he would like to go out of town the 19th. Please advise

## 2018-11-02 DIAGNOSIS — Z955 Presence of coronary angioplasty implant and graft: Secondary | ICD-10-CM

## 2018-11-08 ENCOUNTER — Other Ambulatory Visit: Payer: Self-pay

## 2018-11-08 ENCOUNTER — Ambulatory Visit
Admission: RE | Admit: 2018-11-08 | Discharge: 2018-11-08 | Disposition: A | Discharge status: Home / Self Care | Payer: PPO | Source: Ambulatory Visit | Attending: Internal Medicine | Admitting: Internal Medicine

## 2018-11-08 ENCOUNTER — Other Ambulatory Visit: Payer: Self-pay | Admitting: Internal Medicine

## 2018-11-08 DIAGNOSIS — R634 Abnormal weight loss: Secondary | ICD-10-CM | POA: Diagnosis not present

## 2018-11-08 DIAGNOSIS — R0609 Other forms of dyspnea: Secondary | ICD-10-CM | POA: Diagnosis not present

## 2018-11-08 DIAGNOSIS — D649 Anemia, unspecified: Secondary | ICD-10-CM | POA: Insufficient documentation

## 2018-11-08 DIAGNOSIS — K5730 Diverticulosis of large intestine without perforation or abscess without bleeding: Secondary | ICD-10-CM | POA: Diagnosis not present

## 2018-11-08 DIAGNOSIS — R918 Other nonspecific abnormal finding of lung field: Secondary | ICD-10-CM | POA: Diagnosis not present

## 2018-11-08 DIAGNOSIS — N281 Cyst of kidney, acquired: Secondary | ICD-10-CM | POA: Diagnosis not present

## 2018-11-08 DIAGNOSIS — R1084 Generalized abdominal pain: Secondary | ICD-10-CM | POA: Insufficient documentation

## 2018-11-08 DIAGNOSIS — R197 Diarrhea, unspecified: Secondary | ICD-10-CM | POA: Diagnosis not present

## 2018-11-08 DIAGNOSIS — I700 Atherosclerosis of aorta: Secondary | ICD-10-CM | POA: Diagnosis not present

## 2018-11-08 DIAGNOSIS — M47817 Spondylosis without myelopathy or radiculopathy, lumbosacral region: Secondary | ICD-10-CM | POA: Diagnosis not present

## 2018-11-08 DIAGNOSIS — K4090 Unilateral inguinal hernia, without obstruction or gangrene, not specified as recurrent: Secondary | ICD-10-CM | POA: Diagnosis not present

## 2018-11-08 DIAGNOSIS — D500 Iron deficiency anemia secondary to blood loss (chronic): Secondary | ICD-10-CM | POA: Diagnosis not present

## 2018-11-08 DIAGNOSIS — R0600 Dyspnea, unspecified: Secondary | ICD-10-CM | POA: Diagnosis not present

## 2018-11-08 DIAGNOSIS — I2510 Atherosclerotic heart disease of native coronary artery without angina pectoris: Secondary | ICD-10-CM | POA: Diagnosis not present

## 2018-11-08 DIAGNOSIS — R160 Hepatomegaly, not elsewhere classified: Secondary | ICD-10-CM | POA: Diagnosis not present

## 2018-11-08 MED ORDER — IOHEXOL 300 MG/ML  SOLN
100.0000 mL | Freq: Once | INTRAMUSCULAR | Status: AC | PRN
  Administered 2018-11-08: 100 mL via INTRAVENOUS

## 2018-11-11 ENCOUNTER — Ambulatory Visit: Payer: PPO

## 2018-11-11 ENCOUNTER — Other Ambulatory Visit: Payer: PPO

## 2018-11-11 ENCOUNTER — Ambulatory Visit: Payer: PPO | Admitting: Internal Medicine

## 2018-11-14 DIAGNOSIS — D500 Iron deficiency anemia secondary to blood loss (chronic): Secondary | ICD-10-CM | POA: Diagnosis not present

## 2018-12-07 ENCOUNTER — Encounter: Payer: Self-pay | Admitting: *Deleted

## 2018-12-07 DIAGNOSIS — Z955 Presence of coronary angioplasty implant and graft: Secondary | ICD-10-CM

## 2018-12-07 NOTE — Progress Notes (Signed)
Cardiac Individual Treatment Plan  Patient Details  Name: Seth Hardy MRN: [id number] Date of Birth: [birth year] Referring Provider:     Cardiac Rehab from 05/30/2018 in [facility] Cardiac and Pulmonary Rehab  Referring Provider  Kathlyn Sacramento MD      Initial Encounter Date:    Cardiac Rehab from 05/30/2018 in [facility] Cardiac and Pulmonary Rehab  Date  05/30/18      Visit Diagnosis: Status post coronary artery stent placement  Patient's Home Medications on Admission:  Current Outpatient Medications:  .  acetaminophen (TYLENOL) 500 MG tablet, Take 1,000 mg by mouth every 6 (six) hours as needed for moderate pain or headache., Disp: , Rfl:  .  ALPRAZolam (XANAX) 0.25 MG tablet, Take 0.25 mg by mouth daily as needed for anxiety., Disp: , Rfl:  .  amiodarone (PACERONE) 200 MG tablet, TAKE 1/2 TABLET AT BEDTIME (Patient taking differently: Take 100 mg by mouth every evening. ), Disp: 45 tablet, Rfl: 0 .  amLODipine (NORVASC) 5 MG tablet, Take one tablet by mouth one time daily, Disp: 90 tablet, Rfl: 3 .  clopidogrel (PLAVIX) 75 MG tablet, TAKE 4 TABLETS (300MG) ON DAY 1 THEN 1 TABLET EVERY DAY, Disp: 90 tablet, Rfl: 2 .  Cyanocobalamin (B-12) 1000 MCG CAPS, Take 1,000 mcg by mouth every evening. , Disp: , Rfl:  .  Ferrous Gluconate-C-Folic Acid (IRON-C PO), Take 1 tablet by mouth daily., Disp: , Rfl:  .  furosemide (LASIX) 20 MG tablet, Take 1 tablet (20 mg total) by mouth daily. (Patient taking differently: Take 20 mg by mouth daily. Patient taking prn), Disp: 30 tablet, Rfl: 6 .  glimepiride (AMARYL) 4 MG tablet, Take 4 mg by mouth daily. , Disp: , Rfl:  .  insulin glargine (LANTUS) 100 UNIT/ML injection, Inject 20 Units into the skin daily. , Disp: , Rfl:  .  metFORMIN (GLUCOPHAGE) 500 MG tablet, Take 500 mg by mouth 2 (two) times daily with a meal. , Disp: , Rfl:  .  NITROSTAT 0.4 MG SL tablet, Place 1 tablet (0.4 mg total) under the tongue every 5 (five) minutes as needed for chest pain.,  Disp: 25 tablet, Rfl: 1 .  NOVOLOG FLEXPEN 100 UNIT/ML FlexPen, Inject 6-36 Units into the skin 2 (two) times daily with a meal. , Disp: , Rfl:  .  omega-3 acid ethyl esters (LOVAZA) 1 g capsule, Take 1 capsule (1 g total) by mouth daily., Disp: 90 capsule, Rfl: 4 .  Omega-3 Fatty Acids (FISH OIL) 1000 MG CAPS, Take 1,000 mg by mouth every evening., Disp: , Rfl:  .  OXYGEN, Inhale 2.5 L/min into the lungs at bedtime. With CPAP, Disp: , Rfl:  .  pantoprazole (PROTONIX) 40 MG tablet, Take 1 tablet (40 mg total) by mouth daily., Disp: 90 tablet, Rfl: 3 .  Polyethylene Glycol 400 (BLINK TEARS OP), Place 1 drop into both eyes daily as needed (dry eyes)., Disp: , Rfl:  .  ramipril (ALTACE) 10 MG capsule, Take 10 mg by mouth daily. , Disp: , Rfl:  .  rosuvastatin (CRESTOR) 40 MG tablet, Take 1 tablet (40 mg total) by mouth daily., Disp: 90 tablet, Rfl: 3 .  vitamin C (ASCORBIC ACID) 500 MG tablet, Take 500 mg by mouth every evening., Disp: , Rfl:  .  warfarin (COUMADIN) 5 MG tablet, Take 2.5-5 mg by mouth See admin instructions. Take 5 mg at night on Sun, Mon, Wed, Thur, and Fri.  Take 2.5 mg at night on Tues and Sat, Disp: ,  Rfl:   Past Medical History: Past Medical History:  Diagnosis Date  . Anemia 08/2015   received 2 units rbc one week ago, 3 IV iron infusion -last 1 week.  . Basal cell carcinoma of skin 2012   removed several spots from arms and back  . CAD (coronary artery disease)    a. 01/2010 CABG x 4: LIMA->LAD, VG->D1, VG->OM1, VG->PDA. b. NSTEMI 02/2012 in setting of AF-RVR with cath s/p BMS to RCA (plan for 1 month, possibly up to 3 months of Plavix)  . Carotid disease, bilateral ([place])    a. 03/2011 U/S: 40-59% bilat Carotid dzs;  b. 04/2015 Carotid U/S: 40-59% bilat ICA stenosis; c. 05/2015 CTA Neck: 38% RICA, 10% LICA, mod-marked R vertebral stenosis, mod L vertebral stenosis.  . Chronic kidney disease    small obtusion per pcp. ultrasound done on 08/29/2015  . Diabetes mellitus type II,  controlled ([place])    a. Variable CBG 02/2012 - several meds adjusted.  Marland Kitchen Dysrhythmia    intermittent Atrial Fibrillation  . GERD (gastroesophageal reflux disease)   . Heart murmur   . Hyperlipidemia   . Hypertension   . Hypertensive heart disease   . Iron deficiency anemia    "gets infusions q once in awhile" (04/27/2018)  . Macular edema bil   lazer work done previously  . Mild aortic stenosis    a. 03/2011 Echo: EF 55-60%, Mild AS. b. Mild by cath 02/2012; c. 01/2014 Echo: EF 65-70%, Gr 1 DD, mild AS, mildly dil LA.  . Multiple gastric ulcers 2006  . Myocardial infarction ([place]) 02/2012   stents (x1) at time  . Myocardial infarction [facility])    "2nd one was after 02/2012; don't know date" (04/27/2018)  . Obesity   . On home oxygen therapy    "2.5 w/CPAP" (04/27/2018)  . OSA on CPAP    CPAP settings 3 with oxygen 2.5 (04/27/2018)  . PAF (paroxysmal atrial fibrillation) ([place])    a. Newly dx 02/2012 & initiated on Coumadin (spont converted to NSR).  . Shortness of breath dyspnea   . Transfusion history    transfusions -1 month ago -2 units    Tobacco Use: Social History   Tobacco Use  Smoking Status Never Smoker  Smokeless Tobacco Never Used  Tobacco Comment   tobacco use - no.no passive smoke in home    Labs: Recent Review Flowsheet Data    Labs for ITP Cardiac and Pulmonary Rehab Latest Ref Rng & Units 02/17/2010 02/17/2010 02/17/2010 03/08/2012 03/09/2012   Cholestrol 0 - 200 mg/dL - - - - 99   LDLCALC 0 - 99 mg/dL - - - - 45   HDL >39 mg/dL - - - - 37(L)   Trlycerides <150 mg/dL - - - - 84   Hemoglobin A1c <5.7 % - - - - -   PHART 7.350 - 7.450 - 7.341(L) 7.379 - -   PCO2ART 35.0 - 45.0 mmHg - 40.6 36.7 - -   HCO3 20.0 - 24.0 mEq/L - 21.8 21.5 - -   TCO2 0 - 100 mmol/L 22 23 23 27  -   ACIDBASEDEF 0.0 - 2.0 mmol/L - 3.0(H) 3.0(H) - -   O2SAT % - 98.0 95.0 - -       Exercise Target Goals: Exercise Program Goal: Individual exercise prescription set using results from  initial 6 min walk test and THRR while considering  patient's activity barriers and safety.   Exercise Prescription Goal: Initial exercise prescription builds to  30-45 minutes a day of aerobic activity, 2-3 days per week.  Home exercise guidelines will be given to patient during program as part of exercise prescription that the participant will acknowledge.  Activity Barriers & Risk Stratification:   6 Minute Walk:   Oxygen Initial Assessment:   Oxygen Re-Evaluation:   Oxygen Discharge (Final Oxygen Re-Evaluation):   Initial Exercise Prescription:   Perform Capillary Blood Glucose checks as needed.  Exercise Prescription Changes: Exercise Prescription Changes    Row Name 06/13/18 0900 06/28/18 0900 07/14/18 0800 07/27/18 1500 08/09/18 1500     Response to Exercise   Blood Pressure (Admit)  122/64  124/70  130/58  122/62  134/72   Blood Pressure (Exercise)  144/82  126/70  148/60  128/70  144/54   Blood Pressure (Exit)  130/60  130/70  128/62  124/62  126/74   Heart Rate (Admit)  47 bpm  88 bpm  68 bpm  61 bpm  55 bpm   Heart Rate (Exercise)  85 bpm  80 bpm  74 bpm  104 bpm  83 bpm   Heart Rate (Exit)  51 bpm  52 bpm  58 bpm  60 bpm  51 bpm   Rating of Perceived Exertion (Exercise)  12  12  14  14  14    Symptoms  none  none  none  none  none   Duration  Progress to 45 minutes of aerobic exercise without signs/symptoms of physical distress  Continue with 45 min of aerobic exercise without signs/symptoms of physical distress.  Continue with 45 min of aerobic exercise without signs/symptoms of physical distress.  Continue with 45 min of aerobic exercise without signs/symptoms of physical distress.  Continue with 45 min of aerobic exercise without signs/symptoms of physical distress.   Intensity  THRR unchanged  THRR unchanged  THRR unchanged  THRR unchanged  THRR unchanged     Progression   Progression  Continue to progress workloads to maintain intensity without signs/symptoms  of physical distress.  Continue to progress workloads to maintain intensity without signs/symptoms of physical distress.  Continue to progress workloads to maintain intensity without signs/symptoms of physical distress.  Continue to progress workloads to maintain intensity without signs/symptoms of physical distress.  Continue to progress workloads to maintain intensity without signs/symptoms of physical distress.   Average METs  3.03  3.19  2.87  2.81  2.94     Resistance Training   Training Prescription  Yes  Yes  Yes  Yes  Yes   Weight  4 lbs  4 lbs  4 lbs  4 lbs  4 lbs   Reps  10-15  10-15  10-15  10-15  10-15     Interval Training   Interval Training  No  No  No  No  No     Treadmill   MPH  2.4  2.4  2.4  2.4  2.4   Grade  0.5  0.5  0.5  0.5  0.5   Minutes  15  15  15  15  15    METs  3  3  3  3  3      Recumbant Bike   Level  3  3  3  3  3    Watts  33  27  -  22  20   Minutes  15  15  15  15  15    METs  3.47  3.47  3.1  2.76  2.76  NuStep   Level  5  5  5  5  5    Minutes  15  15  15  15  15    METs  2.8  3.1  2.5  2.7  3.1     Home Exercise Plan   Plans to continue exercise at  Home (comment) walking  Home (comment) walking  Home (comment) walking  Home (comment) walking  Home (comment) walking   Frequency  Add 2 additional days to program exercise sessions.  Add 2 additional days to program exercise sessions.  Add 2 additional days to program exercise sessions.  Add 2 additional days to program exercise sessions.  Add 2 additional days to program exercise sessions.   Initial Home Exercises Provided  06/13/18  06/13/18  06/13/18  06/13/18  06/13/18      Exercise Comments:   Exercise Goals and Review:   Exercise Goals Re-Evaluation : Exercise Goals Re-Evaluation    Row Name 06/13/18 0908 06/28/18 0929 07/14/18 0800 07/18/18 0834 07/27/18 1506     Exercise Goal Re-Evaluation   Exercise Goals Review  Increase Physical Activity;Increase Strength and Stamina;Able to  understand and use rate of perceived exertion (RPE) scale;Knowledge and understanding of Target Heart Rate Range (THRR);Able to check pulse independently;Understanding of Exercise Prescription  Increase Physical Activity;Increase Strength and Stamina;Understanding of Exercise Prescription  Increase Physical Activity;Increase Strength and Stamina;Understanding of Exercise Prescription  Increase Physical Activity;Increase Strength and Stamina;Understanding of Exercise Prescription  Increase Physical Activity;Increase Strength and Stamina;Understanding of Exercise Prescription   Comments  Reviewed home exercise with pt today.  Pt plans to walk at home and use weights for exercise.  Reviewed THR, pulse, RPE, sign and symptoms, NTG use, and when to call 911 or MD.  Also discussed weather considerations and indoor options.  Pt voiced understanding.  Seth Hardy has been doing well in rehab.  He is now up to 2.4 mph on the treadmill and level 3 on the recumbent bike.  We will continue to monitor his prorgress.   Seth Hardy was out on vacation last week.  He has moved his NuStep up to level 5.  We will continue to monitor his progress.   Seth Hardy has been doing his home exercise for three days a week for 15 min.  He feels like he is getting stronger and has more stamina overall.  We talked building his time at home to 30 min.   Seth Hardy continues to do well in rehab.  He is out on vacation again this week!  He is up to level 5 on the T5 NuStep.  We will continue to monitor his progress.    Expected Outcomes  Short: Start to add in walking at home.  Long: Continue to exercise independently  Short: Continue to increase workloads.  Long: Continue to increase strength and stamina.   Short: Add more incline to treadmill.  Long: Continue to increase acitivity levels at home.   Short: Increase time at home to 30 min.  Long: Continue to increase acitivty level.   Short: Return to attending rehab regularly.  Long: Continue to increase walking time at  home.    Seth Hardy Name 08/09/18 1539 08/11/18 1014 08/23/18 1151 09/09/18 1151 10/03/18 1233     Exercise Goal Re-Evaluation   Exercise Goals Review  Increase Physical Activity;Increase Strength and Stamina;Understanding of Exercise Prescription  Increase Physical Activity;Increase Strength and Stamina;Understanding of Exercise Prescription  Increase Physical Activity;Increase Strength and Stamina;Understanding of Exercise Prescription  Increase Physical Activity;Increase Strength and Stamina;Understanding  of Exercise Prescription  Increase Physical Activity;Increase Strength and Stamina;Understanding of Exercise Prescription   Comments  Seth Hardy is doing well in rehab.  He is up to 2.4 mph on the treadmill.  We will continue to monitor his progress at home.   Kaya is doing some exercise at home.  He has been trying to move around and do some weights.  He has gotten our emails but did not try out the videos yet.  He was encouraged to try videos and to walk more.   Seth Hardy is doing some walking at [employer].  He is also using the videos some now.  Today, the closed the beaches so he went walking on the road.  He is trying his best to stay active.   Seth Hardy has gotten back to walking a little more since they got home.  He is planning to return to beach next week and walk down there again.  He is feeling pretty good and doing his best to stay active.   Seth Hardy has been doing some walking.  He admits to doing more when he is at the beach versus at home.  They are planning to head back to the beach again for the nice weather coming up this weekend.    Expected Outcomes  Short: Continue to walk at home.  Long: Continue to increase strength and stamina.   Short: Try videos and increase walking. Long: Continue to exercise at home.   Short: Continue to walk and use videos.  Long; Continue to stay active.   Short: Continue to walk daily.  Long: Continue to maintain physcial activity.   Short: Continue to walk more.  Long: Continue to  stay active.    [place] Name 10/25/18 1125             Exercise Goal Re-Evaluation   Exercise Goals Review  Increase Physical Activity;Increase Strength and Stamina;Understanding of Exercise Prescription       Comments  Seth Hardy is at the beach.  He is walking some on the beach during the week.  He has enjoyed being down there.        Expected Outcomes  Short: Continue to walk more.  Long: Continue to stay active.           Discharge Exercise Prescription (Final Exercise Prescription Changes): Exercise Prescription Changes - 08/09/18 1500      Response to Exercise   Blood Pressure (Admit)  134/72    Blood Pressure (Exercise)  144/54    Blood Pressure (Exit)  126/74    Heart Rate (Admit)  55 bpm    Heart Rate (Exercise)  83 bpm    Heart Rate (Exit)  51 bpm    Rating of Perceived Exertion (Exercise)  14    Symptoms  none    Duration  Continue with 45 min of aerobic exercise without signs/symptoms of physical distress.    Intensity  THRR unchanged      Progression   Progression  Continue to progress workloads to maintain intensity without signs/symptoms of physical distress.    Average METs  2.94      Resistance Training   Training Prescription  Yes    Weight  4 lbs    Reps  10-15      Interval Training   Interval Training  No      Treadmill   MPH  2.4    Grade  0.5    Minutes  15    METs  3  Recumbant Bike   Level  3    Watts  20    Minutes  15    METs  2.76      NuStep   Level  5    Minutes  15    METs  3.1      Home Exercise Plan   Plans to continue exercise at  Home (comment)   walking   Frequency  Add 2 additional days to program exercise sessions.    Initial Home Exercises Provided  06/13/18       Nutrition:  Target Goals: Understanding of nutrition guidelines, daily intake of sodium <1533m, cholesterol <2023m calories 30% from fat and 7% or less from saturated fats, daily to have 5 or more servings of fruits and  vegetables.  Biometrics:    Nutrition Therapy Plan and Nutrition Goals: Nutrition Therapy & Goals - 08/18/18 1135      Nutrition Therapy   Diet  Heart Healthy Low Sodium 1650-1850kcal (BMI: 28.44)(wst: 40inches)     Drug/Food Interactions  --   On insulin (lantis: am, novolog: as needed - usually a coupleX/day.)   Protein (specify units)  70-80g    Fiber  30 grams    Whole Grain Foods  3 servings   pt reports only whole grains as cheerios 2x/week for breakfast   Saturated Fats  12 max. grams    Fruits and Vegetables  5 servings/day   pt reports wife cooking more vegetables with dinner, eating banana and apples for snacks.    Sodium  1.5 grams   pt reports not using salt when cooking or on his meals, doesn't need help reading labels.      Personal Nutrition Goals   Nutrition Goal  ST: Better BG control (adding whole grain cheerios to B 4x/week instead of 2x/week) LT: lose weight (10-12lbs)    Comments  pt reports last A1c 6.9 (<7 for T2DM). Pt reports eating an egg biscuit, pastry, or cheerios for breakfast. L: banana sandwich, banana w/ cereal, roasted peanuts or brBoliviauts. D: red meat (2x/week), chicken (2x/week), fish (1x/week), spaghetti, and veggies like cabbage, pinto beans, salad, colslaw, baked potatoes. Pt reports crackers or an apple for a snack. Discussed the importance of MyPlate and CHO w/ protein and fat for G control as well as whole grains and vegetables.       Intervention Plan   Intervention  Prescribe, educate and counsel regarding individualized specific dietary modifications aiming towards targeted core components such as weight, hypertension, lipid management, diabetes, heart failure and other comorbidities.    Expected Outcomes  Short Term Goal: A plan has been developed with personal nutrition goals set during dietitian appointment.;Short Term Goal: Understand basic principles of dietary content, such as calories, fat, sodium, cholesterol and nutrients.;Long Term  Goal: Adherence to prescribed nutrition plan.       Nutrition Assessments:   Nutrition Goals Re-Evaluation: Nutrition Goals Re-Evaluation    [place] 07/18/18 [id number]/10/20 1105           Goals   Nutrition Goal  Heart Healthy diet, lots of fruits and vegetables, lean meats, low fat, watching salt  ST: continue on healthy eating path LT: lose about 5 more pounds      Comment  He is trying to watch his diet as much as possible.  He does add salt to his food.  His wife is reading food labels for him.  They feel that they have a good grasp of the diet at  this point.  He has been sticking with his lean meats and trying for fruits and vegetables.   A1 now 6.6 and lost 10lbs. whole grain cheerios 3x/week. pt reports he is doing well and wants to continue what he is doing; doesn't want to make any new healthy eating goals. He reports that he wants to get down to 173-175 pounds and now weighs 180 pounds. Pt reported his hemoglobin was around 8 when it was supposed to be 13; pt got iron infusion and takes iron supplements daily with vitamin C.       Expected Outcome  Short: Continue to watch sodium.  Long: Continue to increase fruits and vegetables.   Continue heart healthy eating continue to manage BG and weight         Nutrition Goals Discharge (Final Nutrition Goals Re-Evaluation): Nutrition Goals Re-Evaluation - 11/02/18 1105      Goals   Nutrition Goal  ST: continue on healthy eating path LT: lose about 5 more pounds    Comment  A1 now 6.6 and lost 10lbs. whole grain cheerios 3x/week. pt reports he is doing well and wants to continue what he is doing; doesn't want to make any new healthy eating goals. He reports that he wants to get down to 173-175 pounds and now weighs 180 pounds. Pt reported his hemoglobin was around 8 when it was supposed to be 13; pt got iron infusion and takes iron supplements daily with vitamin C.     Expected Outcome  Continue heart healthy eating continue to manage BG  and weight       Psychosocial: Target Goals: Acknowledge presence or absence of significant depression and/or stress, maximize coping skills, provide positive support system. Participant is able to verbalize types and ability to use techniques and skills needed for reducing stress and depression.   Initial Review & Psychosocial Screening:   Quality of Life Scores:   Scores of 19 and below usually indicate a poorer quality of life in these areas.  A difference of  2-3 points is a clinically meaningful difference.  A difference of 2-3 points in the total score of the Quality of Life Index has been associated with significant improvement in overall quality of life, self-image, physical symptoms, and general health in studies assessing change in quality of life.  PHQ-9: Recent Review Flowsheet Data    Depression screen [facility] 2/9 05/30/2018   Decreased Interest 0   Down, Depressed, Hopeless 0   PHQ - 2 Score 0   Altered sleeping 0   Tired, decreased energy 0   Change in appetite 0   Feeling bad or failure about yourself  0   Trouble concentrating 0   Moving slowly or fidgety/restless 0   Suicidal thoughts 0   PHQ-9 Score 0     Interpretation of Total Score  Total Score Depression Severity:  1-4 = Minimal depression, 5-9 = Mild depression, 10-14 = Moderate depression, 15-19 = Moderately severe depression, 20-27 = Severe depression   Psychosocial Evaluation and Intervention:   Psychosocial Re-Evaluation: Psychosocial Re-Evaluation    [place] Name 07/18/18 0836 08/11/18 1008 09/09/18 1152 10/03/18 1236 10/25/18 1125     Psychosocial Re-Evaluation   Current issues with  Current Stress Concerns  None Identified  None Identified  None Identified  None Identified   Comments  Seth Hardy is doing well mentally. He has been sleeping well.  He was out on vacation and then was feeling a little run down; however, he did not need  an iron infusion.  He has noted that the exercise has really helped build his  strength back up.   counselor Learta Codding documenting in [place] session: counselor contacted patient for re-evaluation.  He reported that his goals of weight loss and increased stamina and energy are being met.  No concerns at present.  Seth Hardy is doing well at home. He is holding up well and not feeling stressed. He has been exercsing and getting out to get groceries with a mask.  He is planning to return to beach next week for a change of scenery and to walk on the beach again.   Adger is doing well.  He is not letting things get to him too much.  He is planning to return to the beach again this week.   Kyair is doing well.  He is at the beach again this week. He is considering the app, but not swayed either way yet.    Expected Outcomes  Short: Continue to attend regularly.   Long: Continue to stay postive.   Short term goal: continue finding ways to exercise outside of program; long term goal: continue building strength and stamina, focusing on positive.  Short: Continue to walk regularly.  Long; Continue to stay positive.   Short: Continue to walk regularly.  Long; Continue to stay positive.   Short: Continue to walk regularly.  Long; Continue to stay positive.    Interventions  -  -  Encouraged to attend Cardiac Rehabilitation for the exercise  Encouraged to attend Cardiac Rehabilitation for the exercise  Encouraged to attend Cardiac Rehabilitation for the exercise   Continue Psychosocial Services   -  Follow up required by staff  Follow up required by staff  Follow up required by staff  Follow up required by staff      Psychosocial Discharge (Final Psychosocial Re-Evaluation): Psychosocial Re-Evaluation - 10/25/18 1125      Psychosocial Re-Evaluation   Current issues with  None Identified    Comments  Donne is doing well.  He is at the beach again this week. He is considering the app, but not swayed either way yet.     Expected Outcomes  Short: Continue to walk regularly.  Long; Continue to stay  positive.     Interventions  Encouraged to attend Cardiac Rehabilitation for the exercise    Continue Psychosocial Services   Follow up required by staff       Vocational Rehabilitation: Provide vocational rehab assistance to qualifying candidates.   Vocational Rehab Evaluation & Intervention:   Education: Education Goals: Education classes will be provided on a variety of topics geared toward better understanding of heart health and risk factor modification. Participant will state understanding/return demonstration of topics presented as noted by education test scores.  Learning Barriers/Preferences:   Education Topics:  AED/CPR: - Group verbal and written instruction with the use of models to demonstrate the basic use of the AED with the basic ABC's of resuscitation.   Cardiac Rehab from 08/01/2018 in [facility] Cardiac and Pulmonary Rehab  Date  07/18/18  Educator  SB  Instruction Review Code  1- Verbalizes Understanding      General Nutrition Guidelines/Fats and Fiber: -Group instruction provided by verbal, written material, models and posters to present the general guidelines for heart healthy nutrition. Gives an explanation and review of dietary fats and fiber.   Cardiac Rehab from 08/01/2018 in [facility] Cardiac and Pulmonary Rehab  Date  08/01/18  Educator  [facility]  Instruction Review  Code  1- Verbalizes Understanding      Controlling Sodium/Reading Food Labels: -Group verbal and written material supporting the discussion of sodium use in heart healthy nutrition. Review and explanation with models, verbal and written materials for utilization of the food label.   Exercise Physiology & General Exercise Guidelines: - Group verbal and written instruction with models to review the exercise physiology of the cardiovascular system and associated critical values. Provides general exercise guidelines with specific guidelines to those with heart or lung disease.    Cardiac Rehab from 08/01/2018  in [facility] Cardiac and Pulmonary Rehab  Date  06/13/18  Educator  [facility]  Instruction Review Code  1- Verbalizes Understanding      Aerobic Exercise & Resistance Training: - Gives group verbal and written instruction on the various components of exercise. Focuses on aerobic and resistive training programs and the benefits of this training and how to safely progress through these programs..   Flexibility, Balance, Mind/Body Relaxation: Provides group verbal/written instruction on the benefits of flexibility and balance training, including mind/body exercise modes such as yoga, pilates and tai chi.  Demonstration and skill practice provided.   Cardiac Rehab from 08/01/2018 in [facility] Cardiac and Pulmonary Rehab  Date  06/20/18  Educator  [facility]  Instruction Review Code  1- Verbalizes Understanding      Stress and Anxiety: - Provides group verbal and written instruction about the health risks of elevated stress and causes of high stress.  Discuss the correlation between heart/lung disease and anxiety and treatment options. Review healthy ways to manage with stress and anxiety.   Depression: - Provides group verbal and written instruction on the correlation between heart/lung disease and depressed mood, treatment options, and the stigmas associated with seeking treatment.   Anatomy & Physiology of the Heart: - Group verbal and written instruction and models provide basic cardiac anatomy and physiology, with the coronary electrical and arterial systems. Review of Valvular disease and Heart Failure   Cardiac Rehab from 08/01/2018 in [facility] Cardiac and Pulmonary Rehab  Date  06/27/18  Educator  SB  Instruction Review Code  1- Verbalizes Understanding      Cardiac Procedures: - Group verbal and written instruction to review commonly prescribed medications for heart disease. Reviews the medication, class of the drug, and side effects. Includes the steps to properly store meds and maintain the prescription  regimen. (beta blockers and nitrates)   Cardiac Medications I: - Group verbal and written instruction to review commonly prescribed medications for heart disease. Reviews the medication, class of the drug, and side effects. Includes the steps to properly store meds and maintain the prescription regimen.   Cardiac Medications II: -Group verbal and written instruction to review commonly prescribed medications for heart disease. Reviews the medication, class of the drug, and side effects. (all other drug classes)    Go Sex-Intimacy & Heart Disease, Get SMART - Goal Setting: - Group verbal and written instruction through game format to discuss heart disease and the return to sexual intimacy. Provides group verbal and written material to discuss and apply goal setting through the application of the S.M.A.R.T. Method.   Other Matters of the Heart: - Provides group verbal, written materials and models to describe Stable Angina and Peripheral Artery. Includes description of the disease process and treatment options available to the cardiac patient.   Cardiac Rehab from 08/01/2018 in [facility] Cardiac and Pulmonary Rehab  Date  06/27/18  Educator  SB  Instruction Review Code  1- Verbalizes Understanding  Exercise & Equipment Safety: - Individual verbal instruction and demonstration of equipment use and safety with use of the equipment.   Cardiac Rehab from 08/01/2018 in [facility] Cardiac and Pulmonary Rehab  Date  05/30/18  Educator  [facility]  Instruction Review Code  1- Verbalizes Understanding      Infection Prevention: - Provides verbal and written material to individual with discussion of infection control including proper hand washing and proper equipment cleaning during exercise session.   Cardiac Rehab from 08/01/2018 in [facility] Cardiac and Pulmonary Rehab  Date  05/30/18  Educator  [facility]  Instruction Review Code  1- Verbalizes Understanding      Falls Prevention: - Provides verbal and written  material to individual with discussion of falls prevention and safety.   Cardiac Rehab from 08/01/2018 in [facility] Cardiac and Pulmonary Rehab  Date  05/30/18  Educator  [facility]  Instruction Review Code  1- Verbalizes Understanding      Diabetes: - Individual verbal and written instruction to review signs/symptoms of diabetes, desired ranges of glucose level fasting, after meals and with exercise. Acknowledge that pre and post exercise glucose checks will be done for 3 sessions at entry of program.   Cardiac Rehab from 08/01/2018 in [facility] Cardiac and Pulmonary Rehab  Date  05/30/18  Educator  [facility]  Instruction Review Code  1- Verbalizes Understanding      Know Your Numbers and Risk Factors: -Group verbal and written instruction about important numbers in your health.  Discussion of what are risk factors and how they play a role in the disease process.  Review of Cholesterol, Blood Pressure, Diabetes, and BMI and the role they play in your overall health.   Sleep Hygiene: -Provides group verbal and written instruction about how sleep can affect your health.  Define sleep hygiene, discuss sleep cycles and impact of sleep habits. Review good sleep hygiene tips.    Other: -Provides group and verbal instruction on various topics (see comments)   Knowledge Questionnaire Score:   Core Components/Risk Factors/Patient Goals at Admission:   Core Components/Risk Factors/Patient Goals Review:  Goals and Risk Factor Review    Row Name 07/18/18 0840 08/11/18 1007 08/23/18 1152 09/09/18 1154 10/03/18 1237     Core Components/Risk Factors/Patient Goals Review   Personal Goals Review  Weight Management/Obesity;Diabetes;Hypertension;Lipids  Weight Management/Obesity;Diabetes;Hypertension;Lipids  Weight Management/Obesity;Diabetes;Hypertension  Weight Management/Obesity;Diabetes;Hypertension  Weight Management/Obesity;Diabetes;Hypertension   Review  Seth Hardy has been doing well with his weight. It has stayed  steady around 186lbs.  His sugars have been good.  He checks them about four times a day.  He also checks his blood pressures a few times a week at home and they have been running.  He is doing well on his medications as well.   Seth Hardy is doing well at home. His weight is still coming down. He was down to 183.8 lbs today.  He is doing well with his blood pressures and continues to check them.  His blood sugars are have been a little more up and down since he is eating a little differently and not moving as much.  He was encouraged to try ot get more exercise.   Since going to the beach, Seth Hardy has not been as able to check his weight or pressures and he doesn't have the equipment down there.  Before he left he was down to 182lbs and his pressures were good.  Overall he is doing well.   Seth Hardy has been doing well.  When he got back home,  his weight was still at 182lbs and holding steady.  His pressures and sugars have been good.  He continues to try to maintain.   Seth Hardy contineus to do well at home. His weight has been holding steady and he is trying to stay healthy.     Expected Outcomes  Short: Continue to work on weight loss with diet.  Long: Continue to monitor risk factors.   Short: Continue to work on weight loss and sugars with more exercise. Long: Continue to monitor risk factors.   Short: Once home, check weight and pressures again.  Long: Continue to monitor risk factors.   Short: Continue to work on weight loss.  Long: Continue to monitor risk factors.   Short: Continue to work on weight loss.  Long: Continue to monitor risk factors.    [place] Name 10/25/18 1127             Core Components/Risk Factors/Patient Goals Review   Personal Goals Review  Weight Management/Obesity;Diabetes;Hypertension       Review  Seth Hardy is doing well.  He is at the beach.  He does not have a way to measure vitals down there, but is doing his best to stay healthy.        Expected Outcomes  Short: Continue to work on weight loss.   Long: Continue to stay on top of risk factors.          Core Components/Risk Factors/Patient Goals at Discharge (Final Review):  Goals and Risk Factor Review - 10/25/18 1127      Core Components/Risk Factors/Patient Goals Review   Personal Goals Review  Weight Management/Obesity;Diabetes;Hypertension    Review  Seth Hardy is doing well.  He is at the beach.  He does not have a way to measure vitals down there, but is doing his best to stay healthy.     Expected Outcomes  Short: Continue to work on weight loss.  Long: Continue to stay on top of risk factors.       ITP Comments: ITP Comments    Row Name 06/22/18 0928 07/14/18 0756 07/20/18 0556 08/17/18 1144 12/07/18 1414   ITP Comments  30 Day Review. Continue with ITP unless directed changes per Medical Director review.  Rishikesh was out on vacation last week.  He now having issues with anemia and has gotten a transfusion.   We will continue to monitor.   30 day review. Continue with ITP unless directed changes by Medical Director chart review.   30 day review. Continue with ITP unless directed changes by Medical Director chart review.  30 day review cycle restarting  after being closed since March 16 because of  Covid 19 pandemic. Program opened to patients on July 6. Not all have returned. ITP updated and sent to Medical Director for review,changes as needed and signature      Comments:

## 2018-12-15 ENCOUNTER — Other Ambulatory Visit: Payer: Self-pay | Admitting: Cardiovascular Disease

## 2018-12-16 ENCOUNTER — Telehealth: Payer: Self-pay | Admitting: Cardiovascular Disease

## 2018-12-16 NOTE — Telephone Encounter (Signed)

## 2018-12-19 ENCOUNTER — Other Ambulatory Visit: Payer: Self-pay

## 2018-12-19 ENCOUNTER — Ambulatory Visit (INDEPENDENT_AMBULATORY_CARE_PROVIDER_SITE_OTHER): Payer: PPO

## 2018-12-19 DIAGNOSIS — I4891 Unspecified atrial fibrillation: Secondary | ICD-10-CM

## 2018-12-19 DIAGNOSIS — Z7901 Long term (current) use of anticoagulants: Secondary | ICD-10-CM

## 2018-12-19 LAB — POCT INR: INR: 2 (ref 2.0–3.0)

## 2018-12-19 NOTE — Patient Instructions (Signed)
Please continue dosage of 1/2 tablet daily except 1 tablet on MONDAYS & FRIDAYS.  Recheck INR in 6 weeks. 

## 2018-12-21 DIAGNOSIS — I2510 Atherosclerotic heart disease of native coronary artery without angina pectoris: Secondary | ICD-10-CM | POA: Diagnosis not present

## 2018-12-21 DIAGNOSIS — G4733 Obstructive sleep apnea (adult) (pediatric): Secondary | ICD-10-CM | POA: Diagnosis not present

## 2018-12-21 DIAGNOSIS — R0689 Other abnormalities of breathing: Secondary | ICD-10-CM | POA: Diagnosis not present

## 2018-12-21 DIAGNOSIS — R011 Cardiac murmur, unspecified: Secondary | ICD-10-CM | POA: Diagnosis not present

## 2018-12-23 ENCOUNTER — Encounter: Payer: Self-pay | Admitting: *Deleted

## 2018-12-23 NOTE — Progress Notes (Signed)
Cslled to see if Seth Hardy is ready to return to on site Cardiac Rehab.  He is out of town and will call back. Will continue Virtual CR until then

## 2019-01-02 ENCOUNTER — Inpatient Hospital Stay (HOSPITAL_BASED_OUTPATIENT_CLINIC_OR_DEPARTMENT_OTHER): Payer: PPO | Admitting: Internal Medicine

## 2019-01-02 ENCOUNTER — Other Ambulatory Visit: Payer: Self-pay

## 2019-01-02 ENCOUNTER — Inpatient Hospital Stay: Payer: PPO | Attending: Internal Medicine

## 2019-01-02 ENCOUNTER — Inpatient Hospital Stay: Payer: PPO

## 2019-01-02 ENCOUNTER — Other Ambulatory Visit: Payer: Self-pay | Admitting: Cardiovascular Disease

## 2019-01-02 ENCOUNTER — Encounter: Payer: Self-pay | Admitting: Internal Medicine

## 2019-01-02 VITALS — BP 164/65 | HR 50 | Resp 20

## 2019-01-02 VITALS — BP 149/78 | HR 43 | Temp 97.1°F | Resp 20 | Ht 68.0 in | Wt 183.0 lb

## 2019-01-02 DIAGNOSIS — I129 Hypertensive chronic kidney disease with stage 1 through stage 4 chronic kidney disease, or unspecified chronic kidney disease: Secondary | ICD-10-CM | POA: Diagnosis not present

## 2019-01-02 DIAGNOSIS — I350 Nonrheumatic aortic (valve) stenosis: Secondary | ICD-10-CM | POA: Diagnosis not present

## 2019-01-02 DIAGNOSIS — D500 Iron deficiency anemia secondary to blood loss (chronic): Secondary | ICD-10-CM

## 2019-01-02 DIAGNOSIS — I252 Old myocardial infarction: Secondary | ICD-10-CM | POA: Diagnosis not present

## 2019-01-02 DIAGNOSIS — D72819 Decreased white blood cell count, unspecified: Secondary | ICD-10-CM | POA: Insufficient documentation

## 2019-01-02 DIAGNOSIS — Z7982 Long term (current) use of aspirin: Secondary | ICD-10-CM | POA: Diagnosis not present

## 2019-01-02 DIAGNOSIS — Z8052 Family history of malignant neoplasm of bladder: Secondary | ICD-10-CM | POA: Insufficient documentation

## 2019-01-02 DIAGNOSIS — E669 Obesity, unspecified: Secondary | ICD-10-CM | POA: Diagnosis not present

## 2019-01-02 DIAGNOSIS — Z801 Family history of malignant neoplasm of trachea, bronchus and lung: Secondary | ICD-10-CM | POA: Insufficient documentation

## 2019-01-02 DIAGNOSIS — I480 Paroxysmal atrial fibrillation: Secondary | ICD-10-CM | POA: Insufficient documentation

## 2019-01-02 DIAGNOSIS — Z86010 Personal history of colonic polyps: Secondary | ICD-10-CM | POA: Insufficient documentation

## 2019-01-02 DIAGNOSIS — Z85828 Personal history of other malignant neoplasm of skin: Secondary | ICD-10-CM | POA: Insufficient documentation

## 2019-01-02 DIAGNOSIS — Z8711 Personal history of peptic ulcer disease: Secondary | ICD-10-CM | POA: Insufficient documentation

## 2019-01-02 DIAGNOSIS — R0602 Shortness of breath: Secondary | ICD-10-CM | POA: Insufficient documentation

## 2019-01-02 DIAGNOSIS — Z79899 Other long term (current) drug therapy: Secondary | ICD-10-CM | POA: Insufficient documentation

## 2019-01-02 DIAGNOSIS — G4730 Sleep apnea, unspecified: Secondary | ICD-10-CM | POA: Insufficient documentation

## 2019-01-02 DIAGNOSIS — N189 Chronic kidney disease, unspecified: Secondary | ICD-10-CM | POA: Diagnosis not present

## 2019-01-02 DIAGNOSIS — D696 Thrombocytopenia, unspecified: Secondary | ICD-10-CM | POA: Diagnosis not present

## 2019-01-02 DIAGNOSIS — E119 Type 2 diabetes mellitus without complications: Secondary | ICD-10-CM | POA: Diagnosis not present

## 2019-01-02 DIAGNOSIS — I2510 Atherosclerotic heart disease of native coronary artery without angina pectoris: Secondary | ICD-10-CM | POA: Insufficient documentation

## 2019-01-02 DIAGNOSIS — K219 Gastro-esophageal reflux disease without esophagitis: Secondary | ICD-10-CM | POA: Insufficient documentation

## 2019-01-02 DIAGNOSIS — Z8673 Personal history of transient ischemic attack (TIA), and cerebral infarction without residual deficits: Secondary | ICD-10-CM | POA: Diagnosis not present

## 2019-01-02 DIAGNOSIS — E785 Hyperlipidemia, unspecified: Secondary | ICD-10-CM | POA: Insufficient documentation

## 2019-01-02 LAB — IRON AND TIBC
Iron: 87 ug/dL (ref 45–182)
Saturation Ratios: 26 % (ref 17.9–39.5)
TIBC: 329 ug/dL (ref 250–450)
UIBC: 242 ug/dL

## 2019-01-02 LAB — CBC WITH DIFFERENTIAL/PLATELET
Abs Immature Granulocytes: 0 10*3/uL (ref 0.00–0.07)
Basophils Absolute: 0 10*3/uL (ref 0.0–0.1)
Basophils Relative: 0 %
Eosinophils Absolute: 0.1 10*3/uL (ref 0.0–0.5)
Eosinophils Relative: 3 %
HCT: 33 % — ABNORMAL LOW (ref 39.0–52.0)
Hemoglobin: 10.8 g/dL — ABNORMAL LOW (ref 13.0–17.0)
Immature Granulocytes: 0 %
Lymphocytes Relative: 27 %
Lymphs Abs: 0.8 10*3/uL (ref 0.7–4.0)
MCH: 30.3 pg (ref 26.0–34.0)
MCHC: 32.7 g/dL (ref 30.0–36.0)
MCV: 92.4 fL (ref 80.0–100.0)
Monocytes Absolute: 0.4 10*3/uL (ref 0.1–1.0)
Monocytes Relative: 14 %
Neutro Abs: 1.7 10*3/uL (ref 1.7–7.7)
Neutrophils Relative %: 56 %
Platelets: 140 10*3/uL — ABNORMAL LOW (ref 150–400)
RBC: 3.57 MIL/uL — ABNORMAL LOW (ref 4.22–5.81)
RDW: 18.4 % — ABNORMAL HIGH (ref 11.5–15.5)
WBC: 3 10*3/uL — ABNORMAL LOW (ref 4.0–10.5)
nRBC: 0 % (ref 0.0–0.2)

## 2019-01-02 LAB — BASIC METABOLIC PANEL
Anion gap: 8 (ref 5–15)
BUN: 19 mg/dL (ref 8–23)
CO2: 27 mmol/L (ref 22–32)
Calcium: 8.9 mg/dL (ref 8.9–10.3)
Chloride: 104 mmol/L (ref 98–111)
Creatinine, Ser: 1.38 mg/dL — ABNORMAL HIGH (ref 0.61–1.24)
GFR calc Af Amer: 59 mL/min — ABNORMAL LOW (ref >60)
GFR calc non Af Amer: 51 mL/min — ABNORMAL LOW (ref >60)
Glucose, Bld: 262 mg/dL — ABNORMAL HIGH (ref 70–99)
Potassium: 4.8 mmol/L (ref 3.5–5.1)
Sodium: 139 mmol/L (ref 135–145)

## 2019-01-02 LAB — FERRITIN: Ferritin: 22 ng/mL — ABNORMAL LOW (ref 24–336)

## 2019-01-02 MED ORDER — SODIUM CHLORIDE 0.9 % IV SOLN
INTRAVENOUS | Status: DC
  Filled 2019-01-02: qty 250

## 2019-01-02 MED ORDER — SODIUM CHLORIDE 0.9 % IV SOLN
510.0000 mg | Freq: Once | INTRAVENOUS | Status: AC
  Administered 2019-01-02: 510 mg via INTRAVENOUS
  Filled 2019-01-02: qty 17

## 2019-01-02 MED ORDER — SODIUM CHLORIDE 0.9 % IV SOLN
Freq: Once | INTRAVENOUS | Status: AC
  Administered 2019-01-02: 15:00:00 via INTRAVENOUS
  Filled 2019-01-02: qty 250

## 2019-01-02 NOTE — Assessment & Plan Note (Signed)
#  Anemia iron deficient-likely secondary to gastric losses versus others. Hemoglobin is 10.8.  Iron studies pending today.  #Hemoglobin overall stable.  Awaiting capsule study with Dr.Mann [will need to be off plavix].  Proceed with IV Feraheme today.  # Leucopenia 3.0/ neutropenia 1.9/thrombocytopenia-140s.  Intermittent.  Stable.  # CAD s/p stenting- on asprin+ plavix x6 months; A.fib- on Coumadin.  Stable  #Creatinine 1.3/slightly elevated from baseline of 1.0.  Recommend close monitoring of blood sugars/risk of chronic kidney disease from diabetes.  Discussed; patient agreement with adequate hydration.  #History of OSA-CPAP/Oxygen- stable.  # DISPOSITION:  #  IV ferrahem today; # follow up in 4 months/cbc;possible IV ferrahem- Dr.B

## 2019-01-02 NOTE — Progress Notes (Signed)
[place] OFFICE PROGRESS NOTE  Patient Care Team: Rusty Aus, MD as PCP - General (Internal Medicine)   SUMMARY OF ONCOLOGIC HISTORY:  # MARCH 2017- April 2017- IRON DEFICIENCY ANEMIA ? Etiology s/p IV iron [EGD- ?June 2017- Bleeding gastric "polyp"/ colo- Dr.Gessner]; recommend CAPSULE STUDY [on HOLD sec to ?plavix]  # hx of A.fib- on coumadin-HOLD April 2017/ CAD [Dr.[name]]; Hx of gastric ulcer [Dr.Gessner];   TIA/ CEA [s/p April 2017]; OSA on CPAP.   History of present illness:   73 -year-old Caucasian male patient is here for follow-up of his severe iron deficiency anemia-;also on Coumadin for A. fib is here for follow-up.   Patient denies any worsening fatigue.  Mild shortness of with on exertion.  No blood in stools no blood in stools.  No chest pain.  He continues to be on iron pills.  Patient has not had his capsule because of his ongoing treatment with Plavix.  Review of Systems  Constitutional: Positive for malaise/fatigue. Negative for chills, diaphoresis and fever.  HENT: Negative for nosebleeds and sore throat.   Eyes: Negative for double vision.  Respiratory: Positive for shortness of breath. Negative for cough, hemoptysis, sputum production and wheezing.   Cardiovascular: Negative for chest pain, palpitations, orthopnea and leg swelling.  Gastrointestinal: Negative for abdominal pain, blood in stool, constipation, diarrhea, heartburn, melena, nausea and vomiting.  Genitourinary: Negative for dysuria, frequency and urgency.  Musculoskeletal: Negative for back pain and joint pain.  Skin: Negative.  Negative for itching and rash.  Neurological: Negative for dizziness, tingling, focal weakness, weakness and headaches.  Endo/Heme/Allergies: Does not bruise/bleed easily.  Psychiatric/Behavioral: Negative for depression. The patient is not nervous/anxious and does not have insomnia.      PAST MEDICAL HISTORY :  Past Medical History  Diagnosis Date   . Hypertensive heart disease   . Hyperlipidemia   . Diabetes mellitus type II, controlled ([place])     a. Variable CBG 02/2012 - several meds adjusted.  . Mild aortic stenosis     a. 03/2011 Echo: EF 55-60%, Mild AS. b. Mild by cath 02/2012; c. 01/2014 Echo: EF 65-70%, Gr 1 DD, mild AS, mildly dil LA.  . Obesity   . GERD (gastroesophageal reflux disease)   . PAF (paroxysmal atrial fibrillation) ([place])     a. Newly dx 02/2012 & initiated on Coumadin (spont converted to NSR).  Marland Kitchen CAD (coronary artery disease)     a. 01/2010 CABG x 4: LIMA->LAD, VG->D1, VG->OM1, VG->PDA. b. NSTEMI 02/2012 in setting of AF-RVR with cath s/p BMS to RCA (plan for 1 month, possibly up to 3 months of Plavix)  . Carotid disease, bilateral ([place])     a. 03/2011 U/S: 40-59% bilat Carotid dzs;  b. 04/2015 Carotid U/S: 40-59% bilat ICA stenosis; c. 05/2015 CTA Neck: 88% RICA, 41% LICA, mod-marked R vertebral stenosis, mod L vertebral stenosis.  . Myocardial infarction ([place]) 2014  . Hypertension   . Shortness of breath dyspnea   . Sleep apnea     CPAP with oxygen  . Chronic kidney disease   . Basal cell carcinoma of skin   . Anemia   . Macular edema bil    PAST SURGICAL HISTORY :   Past Surgical History  Procedure Laterality Date  . Cyst l kidney  [place]  . Angioplasty    . Cardiac catheterization    . Left heart catheterization with coronary angiogram N/A 03/09/2012    Procedure:  LEFT HEART CATHETERIZATION WITH CORONARY ANGIOGRAM;  Surgeon: Wellington Hampshire, MD;  Location: [facility] CATH LAB;  Service: Cardiovascular;  Laterality: N/A;  . Percutaneous coronary stent intervention (pci-s)  03/09/2012    Procedure: PERCUTANEOUS CORONARY STENT INTERVENTION (PCI-S);  Surgeon: Wellington Hampshire, MD;  Location: [facility] CATH LAB;  Service: Cardiovascular;;  . Coronary artery bypass graft  02-Sep-2009    [facility], [place], [place],   . Coronary angioplasty      Dr. Fletcher Anon  . Cataract extraction Bilateral     Cataract Extraction  with IOL  . Inguinal hernia repair Right     Right Inguinal Hernia Repair    FAMILY HISTORY :   Family History  Problem Relation Age of Onset  . COPD Mother     alive 72  . Heart attack Father     09-02-2013 deceased  . Bladder Cancer Mother   . Lung cancer Maternal Aunt   . Liver cancer Maternal Aunt   . Kidney disease Paternal Uncle   . Cancer Other     all paternal aunts and uncles    SOCIAL HISTORY:   Social History  Substance Use Topics  . Smoking status: Never Smoker   . Smokeless tobacco: Never Used     Comment: tobacco use - no  . Alcohol Use: Yes     Comment: rare drink    ALLERGIES:  has No Known Allergies.  MEDICATIONS:  Current Outpatient Prescriptions  Medication Sig Dispense Refill  . acetaminophen (TYLENOL) 325 MG tablet Take 650 mg by mouth every 6 (six) hours as needed. For pain    . amiodarone (PACERONE) 200 MG tablet Take 0.5 tablets (100 mg total) by mouth daily. (Patient taking differently: Take 100 mg by mouth at bedtime. ) 45 tablet 3  . amLODipine (NORVASC) 5 MG tablet Take one tablet by mouth one time daily 90 tablet 3  . aspirin 81 MG EC tablet Take 81 mg by mouth daily.      Marland Kitchen atorvastatin (LIPITOR) 20 MG tablet Take 1 tablet (20 mg total) by mouth at bedtime. 90 tablet 3  . furosemide (LASIX) 20 MG tablet Take 1 tablet (20 mg total) by mouth as needed. 30 tablet 6  . glimepiride (AMARYL) 4 MG tablet Take 4 mg by mouth 2 (two) times daily.      . insulin glargine (LANTUS) 100 UNIT/ML injection Inject 20 Units into the skin daily.     . insulin lispro (HUMALOG) 100 UNIT/ML injection Inject 8 Units into the skin 2 (two) times daily with a meal. (Patient taking differently: Inject into the skin 3 (three) times daily. Per sliding scale.)    . metFORMIN (GLUCOPHAGE) 500 MG tablet Take 500 mg by mouth 2 (two) times daily with a meal.    . nitroGLYCERIN (NITROSTAT) 0.4 MG SL tablet Place 1 tablet (0.4 mg total) under the tongue every 5 (five) minutes as  needed (up to 3 doses). 25 tablet 1  . OXYGEN Inhale 2.5 L/min into the lungs at bedtime. With CPAP    . pantoprazole (PROTONIX) 40 MG tablet Take 1 tablet (40 mg total) by mouth daily. (Patient taking differently: Take 40 mg by mouth 2 (two) times daily. ) 90 tablet 3  . ramipril (ALTACE) 10 MG capsule Take 1 capsule (10 mg total) by mouth daily. 90 capsule 5  . warfarin (COUMADIN) 5 MG tablet Take as directed by anticoagulation clinic 90 tablet 1  . IRON-VITAMIN C PO Take 1 tablet by mouth daily.  Reported on 08/27/2015    . Omega-3 Fatty Acids (FISH OIL) 1200 MG CAPS Take 1 capsule by mouth daily. Reported on 08/27/2015     No current facility-administered medications for this visit.    PHYSICAL EXAMINATION:   BP 158/71 mmHg  Pulse 51  Temp(Src) 96.1 F (35.6 C)  Resp 18  Wt 192 lb 0.3 oz (87.1 kg)  Filed Weights   08/27/15 1015  Weight: 192 lb 0.3 oz (87.1 kg)    Physical Exam  Constitutional: He is oriented to person, place, and time and well-developed, well-nourished, and in no distress.  Accompanied by his wife.  HENT:  Head: Normocephalic and atraumatic.  Mouth/Throat: Oropharynx is clear and moist. No oropharyngeal exudate.  Eyes: Pupils are equal, round, and reactive to light.  Neck: Normal range of motion. Neck supple.  Cardiovascular: Normal rate and regular rhythm.  Pulmonary/Chest: No respiratory distress. He has no wheezes.  Abdominal: Soft. Bowel sounds are normal. He exhibits no distension and no mass. There is no abdominal tenderness. There is no rebound and no guarding.  Musculoskeletal: Normal range of motion.        General: No tenderness or edema.  Neurological: He is alert and oriented to person, place, and time.  Skin: Skin is warm. There is pallor.  Psychiatric: Affect normal.     LABORATORY DATA:  I have reviewed the data as listed    Component Value Date/Time   NA 139 07/25/2015 1138   NA 139 01/22/2013 0348   K 4.1 07/25/2015 1138   K 4.2  01/22/2013 0348   CL 103 07/25/2015 1138   CL 106 01/22/2013 0348   CO2 27 07/25/2015 1138   CO2 28 01/22/2013 0348   GLUCOSE 216* 07/25/2015 1138   GLUCOSE 158* 01/22/2013 0348   BUN 14 07/25/2015 1138   BUN 16 01/22/2013 0348   CREATININE 1.03 07/25/2015 1138   CREATININE 1.02 01/22/2013 0348   CALCIUM 9.1 07/25/2015 1138   CALCIUM 8.6 01/22/2013 0348   PROT 7.2 06/25/2015 1045   PROT 7.5 01/21/2013 0209   ALBUMIN 4.1 06/25/2015 1045   ALBUMIN 4.3 01/21/2013 0209   AST 30 06/25/2015 1045   AST 28 01/21/2013 0209   ALT 33 06/25/2015 1045   ALT 36 01/21/2013 0209   ALKPHOS 104 06/25/2015 1045   ALKPHOS 143* 01/21/2013 0209   BILITOT 0.4 06/25/2015 1045   BILITOT 0.4 01/21/2013 0209   GFRNONAA >60 07/25/2015 1138   GFRNONAA >60 01/22/2013 0348   GFRAA >60 07/25/2015 1138   GFRAA >60 01/22/2013 0348    No results found for: SPEP, UPEP  Lab Results  Component Value Date   WBC 4.5 08/22/2015   NEUTROABS 2.5 08/22/2015   HGB 8.0 Repeated and verified X2.* 08/22/2015   HCT 26.2 Repeated and verified X2.* 08/22/2015   MCV 67.4 Repeated and verified X2.* 08/22/2015   PLT 264.0 08/22/2015      Chemistry      Component Value Date/Time   NA 139 07/25/2015 1138   NA 139 01/22/2013 0348   K 4.1 07/25/2015 1138   K 4.2 01/22/2013 0348   CL 103 07/25/2015 1138   CL 106 01/22/2013 0348   CO2 27 07/25/2015 1138   CO2 28 01/22/2013 0348   BUN 14 07/25/2015 1138   BUN 16 01/22/2013 0348   CREATININE 1.03 07/25/2015 1138   CREATININE 1.02 01/22/2013 0348      Component Value Date/Time   CALCIUM 9.1 07/25/2015 1138   CALCIUM  8.6 01/22/2013 0348   ALKPHOS 104 06/25/2015 1045   ALKPHOS 143* 01/21/2013 0209   AST 30 06/25/2015 1045   AST 28 01/21/2013 0209   ALT 33 06/25/2015 1045   ALT 36 01/21/2013 0209   BILITOT 0.4 06/25/2015 1045   BILITOT 0.4 01/21/2013 0209        ASSESSMENT & PLAN:   Iron deficiency anemia due to chronic blood loss #Anemia iron  deficient-likely secondary to gastric losses versus others. Hemoglobin is 10.8.  Iron studies pending today.  #Hemoglobin overall stable.  Awaiting capsule study with Dr.Mann [will need to be off plavix].  Proceed with IV Feraheme today.  # Leucopenia 3.0/ neutropenia 1.9/thrombocytopenia-140s.  Intermittent.  Stable.  # CAD s/p stenting- on asprin+ plavix x6 months; A.fib- on Coumadin.  Stable  #Creatinine 1.3/slightly elevated from baseline of 1.0.  Recommend close monitoring of blood sugars/risk of chronic kidney disease from diabetes.  Discussed; patient agreement with adequate hydration.  #History of OSA-CPAP/Oxygen- stable.  # DISPOSITION:  #  IV ferrahem today; # follow up in 4 months/cbc;possible IV ferrahem- Dr.B

## 2019-01-09 DIAGNOSIS — E113593 Type 2 diabetes mellitus with proliferative diabetic retinopathy without macular edema, bilateral: Secondary | ICD-10-CM | POA: Diagnosis not present

## 2019-01-21 DIAGNOSIS — R0689 Other abnormalities of breathing: Secondary | ICD-10-CM | POA: Diagnosis not present

## 2019-01-21 DIAGNOSIS — R011 Cardiac murmur, unspecified: Secondary | ICD-10-CM | POA: Diagnosis not present

## 2019-01-21 DIAGNOSIS — G4733 Obstructive sleep apnea (adult) (pediatric): Secondary | ICD-10-CM | POA: Diagnosis not present

## 2019-01-21 DIAGNOSIS — I2510 Atherosclerotic heart disease of native coronary artery without angina pectoris: Secondary | ICD-10-CM | POA: Diagnosis not present

## 2019-02-01 ENCOUNTER — Encounter: Payer: Self-pay | Admitting: *Deleted

## 2019-02-01 DIAGNOSIS — Z955 Presence of coronary angioplasty implant and graft: Secondary | ICD-10-CM

## 2019-02-01 NOTE — Progress Notes (Signed)
Discharge Progress Report  Patient Details  Name: Seth Hardy MRN: [id number] Date of Birth: [birth year] Referring Provider:     Cardiac Rehab from 05/30/2018 in [facility]  Referring Provider  Kathlyn Sacramento MD       Number of Visits: 20  Reason for Discharge:  Early Exit:  Lack of attendance  Smoking History:  Social History   Tobacco Use  Smoking Status Never Smoker  Smokeless Tobacco Never Used  Tobacco Comment   tobacco use - no.no passive smoke in home    Diagnosis:  Status post coronary artery stent placement  ADL [facility]SD:   Initial Exercise Prescription:   Discharge Exercise Prescription (Final Exercise Prescription Changes): Exercise Prescription Changes - 08/09/18 1500      Response to Exercise   Blood Pressure (Admit)  134/72    Blood Pressure (Exercise)  144/54    Blood Pressure (Exit)  126/74    Heart Rate (Admit)  55 bpm    Heart Rate (Exercise)  83 bpm    Heart Rate (Exit)  51 bpm    Rating of Perceived Exertion (Exercise)  14    Symptoms  none    Duration  Continue with 45 min of aerobic exercise without signs/symptoms of physical distress.    Intensity  THRR unchanged      Progression   Progression  Continue to progress workloads to maintain intensity without signs/symptoms of physical distress.    Average METs  2.94      Resistance Training   Training Prescription  Yes    Weight  4 lbs    Reps  10-15      Interval Training   Interval Training  No      Treadmill   MPH  2.4    Grade  0.5    Minutes  15    METs  3      Recumbant Bike   Level  3    Watts  20    Minutes  15    METs  2.76      NuStep   Level  5    Minutes  15    METs  3.1      Home Exercise Plan   Plans to continue exercise at  Home (comment)   walking   Frequency  Add 2 additional days to program exercise sessions.    Initial Home Exercises Provided  06/13/18       Functional Capacity:   Psychological, QOL, Others -  Outcomes: PHQ 2/9: Depression screen PHQ 2/9 05/30/2018  Decreased Interest 0  Down, Depressed, Hopeless 0  PHQ - 2 Score 0  Altered sleeping 0  Tired, decreased energy 0  Change in appetite 0  Feeling bad or failure about yourself  0  Trouble concentrating 0  Moving slowly or fidgety/restless 0  Suicidal thoughts 0  PHQ-9 Score 0  Some recent data might be hidden    Quality of Life:   Personal Goals: Goals established at orientation with interventions provided to work toward goal.    Personal Goals Discharge: Goals and Risk Factor Review    Row Name 08/11/18 1007 08/23/18 1152 09/09/18 1154 10/03/18 1237 10/25/18 1127     Core Components/Risk Factors/Patient Goals Review   Personal Goals Review  Weight Management/Obesity;Diabetes;Hypertension;Lipids  Weight Management/Obesity;Diabetes;Hypertension  Weight Management/Obesity;Diabetes;Hypertension  Weight Management/Obesity;Diabetes;Hypertension  Weight Management/Obesity;Diabetes;Hypertension   Review  Trellis is doing well at home. His weight is still coming down. He was down to  183.8 lbs today.  He is doing well with his blood pressures and continues to check them.  His blood sugars are have been a little more up and down since he is eating a little differently and not moving as much.  He was encouraged to try ot get more exercise.   Since going to the beach, Wacey has not been as able to check his weight or pressures and he doesn't have the equipment down there.  Before he left he was down to 182lbs and his pressures were good.  Overall he is doing well.   Conall has been doing well.  When he got back home, his weight was still at 182lbs and holding steady.  His pressures and sugars have been good.  He continues to try to maintain.   Jakyron contineus to do well at home. His weight has been holding steady and he is trying to stay healthy.    Keilon is doing well.  He is at the beach.  He does not have a way to measure vitals down there, but is  doing his best to stay healthy.    Expected Outcomes  Short: Continue to work on weight loss and sugars with more exercise. Long: Continue to monitor risk factors.   Short: Once home, check weight and pressures again.  Long: Continue to monitor risk factors.   Short: Continue to work on weight loss.  Long: Continue to monitor risk factors.   Short: Continue to work on weight loss.  Long: Continue to monitor risk factors.   Short: Continue to work on weight loss.  Long: Continue to stay on top of risk factors.      Exercise Goals and Review:   Exercise Goals Re-Evaluation: Exercise Goals Re-Evaluation    Row Name 08/09/18 1539 08/11/18 1014 08/23/18 1151 09/09/18 1151 10/03/18 1233     Exercise Goal Re-Evaluation   Exercise Goals Review  Increase Physical Activity;Increase Strength and Stamina;Understanding of Exercise Prescription  Increase Physical Activity;Increase Strength and Stamina;Understanding of Exercise Prescription  Increase Physical Activity;Increase Strength and Stamina;Understanding of Exercise Prescription  Increase Physical Activity;Increase Strength and Stamina;Understanding of Exercise Prescription  Increase Physical Activity;Increase Strength and Stamina;Understanding of Exercise Prescription   Comments  Rashod is doing well in rehab.  He is up to 2.4 mph on the treadmill.  We will continue to monitor his progress at home.   Hampton is doing some exercise at home.  He has been trying to move around and do some weights.  He has gotten our emails but did not try out the videos yet.  He was encouraged to try videos and to walk more.   Haziq is doing some walking at [employer].  He is also using the videos some now.  Today, the closed the beaches so he went walking on the road.  He is trying his best to stay active.   Ragen has gotten back to walking a little more since they got home.  He is planning to return to beach next week and walk down there again.  He is feeling pretty good and doing his  best to stay active.   Fredick has been doing some walking.  He admits to doing more when he is at the beach versus at home.  They are planning to head back to the beach again for the nice weather coming up this weekend.    Expected Outcomes  Short: Continue to walk at home.  Long: Continue to increase strength and  stamina.   Short: Try videos and increase walking. Long: Continue to exercise at home.   Short: Continue to walk and use videos.  Long; Continue to stay active.   Short: Continue to walk daily.  Long: Continue to maintain physcial activity.   Short: Continue to walk more.  Long: Continue to stay active.    [place] Name 10/25/18 1125             Exercise Goal Re-Evaluation   Exercise Goals Review  Increase Physical Activity;Increase Strength and Stamina;Understanding of Exercise Prescription       Comments  Trestan is at the beach.  He is walking some on the beach during the week.  He has enjoyed being down there.        Expected Outcomes  Short: Continue to walk more.  Long: Continue to stay active.           Nutrition & Weight - Outcomes:    Nutrition: Nutrition Therapy & Goals - 08/18/18 1135      Nutrition Therapy   Diet  Heart Healthy Low Sodium 1650-1850kcal (BMI: 28.44)(wst: 40inches)     Drug/Food Interactions  --   On insulin (lantis: am, novolog: as needed - usually a coupleX/day.)   Protein (specify units)  70-80g    Fiber  30 grams    Whole Grain Foods  3 servings   pt reports only whole grains as cheerios 2x/week for breakfast   Saturated Fats  12 max. grams    Fruits and Vegetables  5 servings/day   pt reports wife cooking more vegetables with dinner, eating banana and apples for snacks.    Sodium  1.5 grams   pt reports not using salt when cooking or on his meals, doesn't need help reading labels.      Personal Nutrition Goals   Nutrition Goal  ST: Better BG control (adding whole grain cheerios to B 4x/week instead of 2x/week) LT: lose weight (10-12lbs)     Comments  pt reports last A1c 6.9 (<7 for T2DM). Pt reports eating an egg biscuit, pastry, or cheerios for breakfast. L: banana sandwich, banana w/ cereal, roasted peanuts or Bolivia nuts. D: red meat (2x/week), chicken (2x/week), fish (1x/week), spaghetti, and veggies like cabbage, pinto beans, salad, colslaw, baked potatoes. Pt reports crackers or an apple for a snack. Discussed the importance of MyPlate and CHO w/ protein and fat for G control as well as whole grains and vegetables.       Intervention Plan   Intervention  Prescribe, educate and counsel regarding individualized specific dietary modifications aiming towards targeted core components such as weight, hypertension, lipid management, diabetes, heart failure and other comorbidities.    Expected Outcomes  Short Term Goal: A plan has been developed with personal nutrition goals set during dietitian appointment.;Short Term Goal: Understand basic principles of dietary content, such as calories, fat, sodium, cholesterol and nutrients.;Long Term Goal: Adherence to prescribed nutrition plan.       Nutrition Discharge:   Education Questionnaire Score:   Goals reviewed with patient; copy given to patient.

## 2019-02-01 NOTE — Progress Notes (Signed)
Cardiac Individual Treatment Plan  Patient Details  Name: Seth Hardy MRN: [id number] Date of Birth: [birth year] Referring Provider:     Cardiac Rehab from 05/30/2018 in [facility] Cardiac and Pulmonary Rehab  Referring Provider  Kathlyn Sacramento MD      Initial Encounter Date:    Cardiac Rehab from 05/30/2018 in [facility] Cardiac and Pulmonary Rehab  Date  05/30/18      Visit Diagnosis: Status post coronary artery stent placement  Patient's Home Medications on Admission:  Current Outpatient Medications:  .  acetaminophen (TYLENOL) 500 MG tablet, Take 1,000 mg by mouth every 6 (six) hours as needed for moderate pain or headache., Disp: , Rfl:  .  ALPRAZolam (XANAX) 0.25 MG tablet, Take 0.25 mg by mouth daily as needed for anxiety., Disp: , Rfl:  .  amiodarone (PACERONE) 200 MG tablet, TAKE 1/2 TABLET AT BEDTIME (Patient taking differently: Take 100 mg by mouth every evening. ), Disp: 45 tablet, Rfl: 0 .  amLODipine (NORVASC) 5 MG tablet, Take one tablet by mouth one time daily, Disp: 90 tablet, Rfl: 3 .  clopidogrel (PLAVIX) 75 MG tablet, TAKE 4 TABLETS (300MG) ON DAY 1 THEN 1 TABLET EVERY DAY, Disp: 90 tablet, Rfl: 2 .  Cyanocobalamin (B-12) 1000 MCG CAPS, Take 1,000 mcg by mouth every evening. , Disp: , Rfl:  .  Ferrous Gluconate-C-Folic Acid (IRON-C PO), Take 1 tablet by mouth daily., Disp: , Rfl:  .  furosemide (LASIX) 20 MG tablet, Take 1 tablet (20 mg total) by mouth daily. (Patient taking differently: Take 20 mg by mouth daily. Patient taking prn), Disp: 30 tablet, Rfl: 6 .  glimepiride (AMARYL) 4 MG tablet, Take 4 mg by mouth daily. , Disp: , Rfl:  .  insulin glargine (LANTUS) 100 UNIT/ML injection, Inject 20 Units into the skin daily. , Disp: , Rfl:  .  nitroGLYCERIN (NITROSTAT) 0.4 MG SL tablet, PLACE 1 TABLET UNDER THE TONGUE EVERY 5 MINUTES FOR UP TO 3 DOSES AS NEEDED FOR CHEST PAINS, IF NO RELIEF CALL 911 (Patient not taking: Reported on 01/02/2019), Disp: 25 tablet, Rfl: 0 .  NOVOLOG  FLEXPEN 100 UNIT/ML FlexPen, Inject 6-36 Units into the skin 2 (two) times daily with a meal. , Disp: , Rfl:  .  omega-3 acid ethyl esters (LOVAZA) 1 g capsule, Take 1 capsule (1 g total) by mouth daily., Disp: 90 capsule, Rfl: 4 .  Omega-3 Fatty Acids (FISH OIL) 1000 MG CAPS, Take 1,000 mg by mouth every evening., Disp: , Rfl:  .  OXYGEN, Inhale 2.5 L/min into the lungs at bedtime. With CPAP, Disp: , Rfl:  .  pantoprazole (PROTONIX) 40 MG tablet, Take 1 tablet (40 mg total) by mouth daily., Disp: 90 tablet, Rfl: 3 .  Polyethylene Glycol 400 (BLINK TEARS OP), Place 1 drop into both eyes daily as needed (dry eyes)., Disp: , Rfl:  .  ramipril (ALTACE) 10 MG capsule, Take 10 mg by mouth daily. , Disp: , Rfl:  .  rosuvastatin (CRESTOR) 40 MG tablet, TAKE 1 TABLET BY MOUTH DAILY, Disp: 90 tablet, Rfl: 0 .  vitamin C (ASCORBIC ACID) 500 MG tablet, Take 500 mg by mouth every evening., Disp: , Rfl:  .  warfarin (COUMADIN) 5 MG tablet, Take 2.5-5 mg by mouth See admin instructions. Take 5 mg at night on Sun, Mon, Wed, Thur, and Fri.  Take 2.5 mg at night on Tues and Sat, Disp: , Rfl:   Past Medical History: Past Medical History:  Diagnosis Date  .  Anemia 08/2015   received 2 units rbc one week ago, 3 IV iron infusion -last 1 week.  . Basal cell carcinoma of skin 2012   removed several spots from arms and back  . CAD (coronary artery disease)    a. 01/2010 CABG x 4: LIMA->LAD, VG->D1, VG->OM1, VG->PDA. b. NSTEMI 02/2012 in setting of AF-RVR with cath s/p BMS to RCA (plan for 1 month, possibly up to 3 months of Plavix)  . Carotid disease, bilateral ([place])    a. 03/2011 U/S: 40-59% bilat Carotid dzs;  b. 04/2015 Carotid U/S: 40-59% bilat ICA stenosis; c. 05/2015 CTA Neck: 39% RICA, 76% LICA, mod-marked R vertebral stenosis, mod L vertebral stenosis.  . Chronic kidney disease    small obtusion per pcp. ultrasound done on 08/29/2015  . Diabetes mellitus type II, controlled ([place])    a. Variable CBG 02/2012 -  several meds adjusted.  Marland Kitchen Dysrhythmia    intermittent Atrial Fibrillation  . GERD (gastroesophageal reflux disease)   . Heart murmur   . Hyperlipidemia   . Hypertension   . Hypertensive heart disease   . Iron deficiency anemia    "gets infusions q once in awhile" (04/27/2018)  . Macular edema bil   lazer work done previously  . Mild aortic stenosis    a. 03/2011 Echo: EF 55-60%, Mild AS. b. Mild by cath 02/2012; c. 01/2014 Echo: EF 65-70%, Gr 1 DD, mild AS, mildly dil LA.  . Multiple gastric ulcers 2006  . Myocardial infarction ([place]) 02/2012   stents (x1) at time  . Myocardial infarction [facility])    "2nd one was after 02/2012; don't know date" (04/27/2018)  . Obesity   . On home oxygen therapy    "2.5 w/CPAP" (04/27/2018)  . OSA on CPAP    CPAP settings 3 with oxygen 2.5 (04/27/2018)  . PAF (paroxysmal atrial fibrillation) ([place])    a. Newly dx 02/2012 & initiated on Coumadin (spont converted to NSR).  . Shortness of breath dyspnea   . Transfusion history    transfusions -1 month ago -2 units    Tobacco Use: Social History   Tobacco Use  Smoking Status Never Smoker  Smokeless Tobacco Never Used  Tobacco Comment   tobacco use - no.no passive smoke in home    Labs: Recent Review Flowsheet Data    Labs for ITP Cardiac and Pulmonary Rehab Latest Ref Rng & Units 02/17/2010 02/17/2010 02/17/2010 03/08/2012 03/09/2012   Cholestrol 0 - 200 mg/dL - - - - 99   LDLCALC 0 - 99 mg/dL - - - - 45   HDL >39 mg/dL - - - - 37(L)   Trlycerides <150 mg/dL - - - - 84   Hemoglobin A1c <5.7 % - - - - -   PHART 7.350 - 7.450 - 7.341(L) 7.379 - -   PCO2ART 35.0 - 45.0 mmHg - 40.6 36.7 - -   HCO3 20.0 - 24.0 mEq/L - 21.8 21.5 - -   TCO2 0 - 100 mmol/L _0 -   ACIDBASEDEF 0.0 - 2.0 mmol/L - 3.0(H) 3.0(H) - -   O2SAT % - 98.0 95.0 - -       Exercise Target Goals: Exercise Program Goal: Individual exercise prescription set using results from initial 6 min walk test and THRR while  considering  patient's activity barriers and safety.   Exercise Prescription Goal: Initial exercise prescription builds to 30-45 minutes a day of aerobic activity, 2-3 days per week.  Home exercise  guidelines will be given to patient during program as part of exercise prescription that the participant will acknowledge.  Activity Barriers & Risk Stratification:   6 Minute Walk:   Oxygen Initial Assessment:   Oxygen Re-Evaluation:   Oxygen Discharge (Final Oxygen Re-Evaluation):   Initial Exercise Prescription:   Perform Capillary Blood Glucose checks as needed.  Exercise Prescription Changes: Exercise Prescription Changes    Row Name 08/09/18 1500             Response to Exercise   Blood Pressure (Admit)  134/72       Blood Pressure (Exercise)  144/54       Blood Pressure (Exit)  126/74       Heart Rate (Admit)  55 bpm       Heart Rate (Exercise)  83 bpm       Heart Rate (Exit)  51 bpm       Rating of Perceived Exertion (Exercise)  14       Symptoms  none       Duration  Continue with 45 min of aerobic exercise without signs/symptoms of physical distress.       Intensity  THRR unchanged         Progression   Progression  Continue to progress workloads to maintain intensity without signs/symptoms of physical distress.       Average METs  2.94         Resistance Training   Training Prescription  Yes       Weight  4 lbs       Reps  10-15         Interval Training   Interval Training  No         Treadmill   MPH  2.4       Grade  0.5       Minutes  15       METs  3         Recumbant Bike   Level  3       Watts  20       Minutes  15       METs  2.76         NuStep   Level  5       Minutes  15       METs  3.1         Home Exercise Plan   Plans to continue exercise at  Home (comment) walking       Frequency  Add 2 additional days to program exercise sessions.       Initial Home Exercises Provided  06/13/18          Exercise  Comments:   Exercise Goals and Review:   Exercise Goals Re-Evaluation : Exercise Goals Re-Evaluation    Row Name 08/09/18 1539 08/11/18 1014 08/23/18 1151 09/09/18 1151 10/03/18 1233     Exercise Goal Re-Evaluation   Exercise Goals Review  Increase Physical Activity;Increase Strength and Stamina;Understanding of Exercise Prescription  Increase Physical Activity;Increase Strength and Stamina;Understanding of Exercise Prescription  Increase Physical Activity;Increase Strength and Stamina;Understanding of Exercise Prescription  Increase Physical Activity;Increase Strength and Stamina;Understanding of Exercise Prescription  Increase Physical Activity;Increase Strength and Stamina;Understanding of Exercise Prescription   Comments  Seth Hardy is doing well in rehab.  He is up to 2.4 mph on the treadmill.  We will continue to monitor his progress at home.   Seth Hardy is doing some exercise at home.  He has been trying to move around and do some weights.  He has gotten our emails but did not try out the videos yet.  He was encouraged to try videos and to walk more.   Seth Hardy is doing some walking at [employer].  He is also using the videos some now.  Today, the closed the beaches so he went walking on the road.  He is trying his best to stay active.   Seth Hardy has gotten back to walking a little more since they got home.  He is planning to return to beach next week and walk down there again.  He is feeling pretty good and doing his best to stay active.   Seth Hardy has been doing some walking.  He admits to doing more when he is at the beach versus at home.  They are planning to head back to the beach again for the nice weather coming up this weekend.    Expected Outcomes  Short: Continue to walk at home.  Long: Continue to increase strength and stamina.   Short: Try videos and increase walking. Long: Continue to exercise at home.   Short: Continue to walk and use videos.  Long; Continue to stay active.   Short: Continue to walk daily.   Long: Continue to maintain physcial activity.   Short: Continue to walk more.  Long: Continue to stay active.    [place] Name 10/25/18 1125             Exercise Goal Re-Evaluation   Exercise Goals Review  Increase Physical Activity;Increase Strength and Stamina;Understanding of Exercise Prescription       Comments  Seth Hardy is at the beach.  He is walking some on the beach during the week.  He has enjoyed being down there.        Expected Outcomes  Short: Continue to walk more.  Long: Continue to stay active.           Discharge Exercise Prescription (Final Exercise Prescription Changes): Exercise Prescription Changes - 08/09/18 1500      Response to Exercise   Blood Pressure (Admit)  134/72    Blood Pressure (Exercise)  144/54    Blood Pressure (Exit)  126/74    Heart Rate (Admit)  55 bpm    Heart Rate (Exercise)  83 bpm    Heart Rate (Exit)  51 bpm    Rating of Perceived Exertion (Exercise)  14    Symptoms  none    Duration  Continue with 45 min of aerobic exercise without signs/symptoms of physical distress.    Intensity  THRR unchanged      Progression   Progression  Continue to progress workloads to maintain intensity without signs/symptoms of physical distress.    Average METs  2.94      Resistance Training   Training Prescription  Yes    Weight  4 lbs    Reps  10-15      Interval Training   Interval Training  No      Treadmill   MPH  2.4    Grade  0.5    Minutes  15    METs  3      Recumbant Bike   Level  3    Watts  20    Minutes  15    METs  2.76      NuStep   Level  5    Minutes  15    METs  3.1  Home Exercise Plan   Plans to continue exercise at  Home (comment)   walking   Frequency  Add 2 additional days to program exercise sessions.    Initial Home Exercises Provided  06/13/18       Nutrition:  Target Goals: Understanding of nutrition guidelines, daily intake of sodium <1572m, cholesterol <2069m calories 30% from fat and 7% or less from  saturated fats, daily to have 5 or more servings of fruits and vegetables.  Biometrics:    Nutrition Therapy Plan and Nutrition Goals: Nutrition Therapy & Goals - 08/18/18 1135      Nutrition Therapy   Diet  Heart Healthy Low Sodium 1650-1850kcal (BMI: 28.44)(wst: 40inches)     Drug/Food Interactions  --   On insulin (lantis: am, novolog: as needed - usually a coupleX/day.)   Protein (specify units)  70-80g    Fiber  30 grams    Whole Grain Foods  3 servings   pt reports only whole grains as cheerios 2x/week for breakfast   Saturated Fats  12 max. grams    Fruits and Vegetables  5 servings/day   pt reports wife cooking more vegetables with dinner, eating banana and apples for snacks.    Sodium  1.5 grams   pt reports not using salt when cooking or on his meals, doesn't need help reading labels.      Personal Nutrition Goals   Nutrition Goal  ST: Better BG control (adding whole grain cheerios to B 4x/week instead of 2x/week) LT: lose weight (10-12lbs)    Comments  pt reports last A1c 6.9 (<7 for T2DM). Pt reports eating an egg biscuit, pastry, or cheerios for breakfast. L: banana sandwich, banana w/ cereal, roasted peanuts or brBoliviauts. D: red meat (2x/week), chicken (2x/week), fish (1x/week), spaghetti, and veggies like cabbage, pinto beans, salad, colslaw, baked potatoes. Pt reports crackers or an apple for a snack. Discussed the importance of MyPlate and CHO w/ protein and fat for G control as well as whole grains and vegetables.       Intervention Plan   Intervention  Prescribe, educate and counsel regarding individualized specific dietary modifications aiming towards targeted core components such as weight, hypertension, lipid management, diabetes, heart failure and other comorbidities.    Expected Outcomes  Short Term Goal: A plan has been developed with personal nutrition goals set during dietitian appointment.;Short Term Goal: Understand basic principles of dietary content, such  as calories, fat, sodium, cholesterol and nutrients.;Long Term Goal: Adherence to prescribed nutrition plan.       Nutrition Assessments:   Nutrition Goals Re-Evaluation: Nutrition Goals Re-Evaluation    [place] 11/02/18 1105             Goals   Nutrition Goal  ST: continue on healthy eating path LT: lose about 5 more pounds       Comment  A1 now 6.6 and lost 10lbs. whole grain cheerios 3x/week. pt reports he is doing well and wants to continue what he is doing; doesn't want to make any new healthy eating goals. He reports that he wants to get down to 173-175 pounds and now weighs 180 pounds. Pt reported his hemoglobin was around 8 when it was supposed to be 13; pt got iron infusion and takes iron supplements daily with vitamin C.        Expected Outcome  Continue heart healthy eating continue to manage BG and weight          Nutrition Goals Discharge (Final  Nutrition Goals Re-Evaluation): Nutrition Goals Re-Evaluation - 11/02/18 1105      Goals   Nutrition Goal  ST: continue on healthy eating path LT: lose about 5 more pounds    Comment  A1 now 6.6 and lost 10lbs. whole grain cheerios 3x/week. pt reports he is doing well and wants to continue what he is doing; doesn't want to make any new healthy eating goals. He reports that he wants to get down to 173-175 pounds and now weighs 180 pounds. Pt reported his hemoglobin was around 8 when it was supposed to be 13; pt got iron infusion and takes iron supplements daily with vitamin C.     Expected Outcome  Continue heart healthy eating continue to manage BG and weight       Psychosocial: Target Goals: Acknowledge presence or absence of significant depression and/or stress, maximize coping skills, provide positive support system. Participant is able to verbalize types and ability to use techniques and skills needed for reducing stress and depression.   Initial Review & Psychosocial Screening:   Quality of Life Scores:   Scores of  19 and below usually indicate a poorer quality of life in these areas.  A difference of  2-3 points is a clinically meaningful difference.  A difference of 2-3 points in the total score of the Quality of Life Index has been associated with significant improvement in overall quality of life, self-image, physical symptoms, and general health in studies assessing change in quality of life.  PHQ-9: Recent Review Flowsheet Data    Depression screen [facility] 2/9 05/30/2018   Decreased Interest 0   Down, Depressed, Hopeless 0   PHQ - 2 Score 0   Altered sleeping 0   Tired, decreased energy 0   Change in appetite 0   Feeling bad or failure about yourself  0   Trouble concentrating 0   Moving slowly or fidgety/restless 0   Suicidal thoughts 0   PHQ-9 Score 0     Interpretation of Total Score  Total Score Depression Severity:  1-4 = Minimal depression, 5-9 = Mild depression, 10-14 = Moderate depression, 15-19 = Moderately severe depression, 20-27 = Severe depression   Psychosocial Evaluation and Intervention:   Psychosocial Re-Evaluation: Psychosocial Re-Evaluation    Seth Hardy Name 08/11/18 1008 09/09/18 1152 10/03/18 1236 10/25/18 1125       Psychosocial Re-Evaluation   Current issues with  None Identified  None Identified  None Identified  None Identified    Comments  counselor Learta Codding documenting in [place] session: counselor contacted patient for re-evaluation.  He reported that his goals of weight loss and increased stamina and energy are being met.  No concerns at present.  Richey is doing well at home. He is holding up well and not feeling stressed. He has been exercsing and getting out to get groceries with a mask.  He is planning to return to beach next week for a change of scenery and to walk on the beach again.   Khyren is doing well.  He is not letting things get to him too much.  He is planning to return to the beach again this week.   Taurean is doing well.  He is at the beach again this week.  He is considering the app, but not swayed either way yet.     Expected Outcomes  Short term goal: continue finding ways to exercise outside of program; long term goal: continue building strength and stamina, focusing on positive.  Short: Continue to walk regularly.  Long; Continue to stay positive.   Short: Continue to walk regularly.  Long; Continue to stay positive.   Short: Continue to walk regularly.  Long; Continue to stay positive.     Interventions  -  Encouraged to attend Cardiac Rehabilitation for the exercise  Encouraged to attend Cardiac Rehabilitation for the exercise  Encouraged to attend Cardiac Rehabilitation for the exercise    Continue Psychosocial Services   Follow up required by staff  Follow up required by staff  Follow up required by staff  Follow up required by staff       Psychosocial Discharge (Final Psychosocial Re-Evaluation): Psychosocial Re-Evaluation - 10/25/18 1125      Psychosocial Re-Evaluation   Current issues with  None Identified    Comments  Seth Hardy is doing well.  He is at the beach again this week. He is considering the app, but not swayed either way yet.     Expected Outcomes  Short: Continue to walk regularly.  Long; Continue to stay positive.     Interventions  Encouraged to attend Cardiac Rehabilitation for the exercise    Continue Psychosocial Services   Follow up required by staff       Vocational Rehabilitation: Provide vocational rehab assistance to qualifying candidates.   Vocational Rehab Evaluation & Intervention:   Education: Education Goals: Education classes will be provided on a variety of topics geared toward better understanding of heart health and risk factor modification. Participant will state understanding/return demonstration of topics presented as noted by education test scores.  Learning Barriers/Preferences:   Education Topics:  AED/CPR: - Group verbal and written instruction with the use of models to demonstrate the basic  use of the AED with the basic ABC's of resuscitation.   Cardiac Rehab from 08/01/2018 in [facility] Cardiac and Pulmonary Rehab  Date  07/18/18  Educator  Seth Hardy  Instruction Review Code  1- Verbalizes Understanding      General Nutrition Guidelines/Fats and Fiber: -Group instruction provided by verbal, written material, models and posters to present the general guidelines for heart healthy nutrition. Gives an explanation and review of dietary fats and fiber.   Cardiac Rehab from 08/01/2018 in [facility] Cardiac and Pulmonary Rehab  Date  08/01/18  Educator  [facility]  Instruction Review Code  1- Verbalizes Understanding      Controlling Sodium/Reading Food Labels: -Group verbal and written material supporting the discussion of sodium use in heart healthy nutrition. Review and explanation with models, verbal and written materials for utilization of the food label.   Exercise Physiology & General Exercise Guidelines: - Group verbal and written instruction with models to review the exercise physiology of the cardiovascular system and associated critical values. Provides general exercise guidelines with specific guidelines to those with heart or lung disease.    Cardiac Rehab from 08/01/2018 in [facility] Cardiac and Pulmonary Rehab  Date  06/13/18  Educator  [facility]  Instruction Review Code  1- Verbalizes Understanding      Aerobic Exercise & Resistance Training: - Gives group verbal and written instruction on the various components of exercise. Focuses on aerobic and resistive training programs and the benefits of this training and how to safely progress through these programs..   Flexibility, Balance, Mind/Body Relaxation: Provides group verbal/written instruction on the benefits of flexibility and balance training, including mind/body exercise modes such as yoga, pilates and tai chi.  Demonstration and skill practice provided.   Cardiac Rehab from 08/01/2018 in [facility] Cardiac and Pulmonary  Rehab  Date  06/20/18  Educator   [facility]  Instruction Review Code  1- Verbalizes Understanding      Stress and Anxiety: - Provides group verbal and written instruction about the health risks of elevated stress and causes of high stress.  Discuss the correlation between heart/lung disease and anxiety and treatment options. Review healthy ways to manage with stress and anxiety.   Depression: - Provides group verbal and written instruction on the correlation between heart/lung disease and depressed mood, treatment options, and the stigmas associated with seeking treatment.   Anatomy & Physiology of the Heart: - Group verbal and written instruction and models provide basic cardiac anatomy and physiology, with the coronary electrical and arterial systems. Review of Valvular disease and Heart Failure   Cardiac Rehab from 08/01/2018 in [facility] Cardiac and Pulmonary Rehab  Date  06/27/18  Educator  Seth Hardy  Instruction Review Code  1- Verbalizes Understanding      Cardiac Procedures: - Group verbal and written instruction to review commonly prescribed medications for heart disease. Reviews the medication, class of the drug, and side effects. Includes the steps to properly store meds and maintain the prescription regimen. (beta blockers and nitrates)   Cardiac Medications I: - Group verbal and written instruction to review commonly prescribed medications for heart disease. Reviews the medication, class of the drug, and side effects. Includes the steps to properly store meds and maintain the prescription regimen.   Cardiac Medications II: -Group verbal and written instruction to review commonly prescribed medications for heart disease. Reviews the medication, class of the drug, and side effects. (all other drug classes)    Go Sex-Intimacy & Heart Disease, Get SMART - Goal Setting: - Group verbal and written instruction through game format to discuss heart disease and the return to sexual intimacy. Provides group verbal and written  material to discuss and apply goal setting through the application of the S.M.A.R.T. Method.   Other Matters of the Heart: - Provides group verbal, written materials and models to describe Stable Angina and Peripheral Artery. Includes description of the disease process and treatment options available to the cardiac patient.   Cardiac Rehab from 08/01/2018 in [facility] Cardiac and Pulmonary Rehab  Date  06/27/18  Educator  Seth Hardy  Instruction Review Code  1- Verbalizes Understanding      Exercise & Equipment Safety: - Individual verbal instruction and demonstration of equipment use and safety with use of the equipment.   Cardiac Rehab from 08/01/2018 in [facility]  Date  05/30/18  Educator  [facility]  Instruction Review Code  1- Verbalizes Understanding      Infection Prevention: - Provides verbal and written material to individual with discussion of infection control including proper hand washing and proper equipment cleaning during exercise session.   Cardiac Rehab from 08/01/2018 in [facility] Cardiac and Pul[facility] Rehab  Date  05/30/18  Educator  [facility]  Instruction Review Code  1- Verbalizes Understanding      Falls Prevention: - Provides verbal and written material to individual with discussion of falls prevention and safety.   Cardiac Rehab from 08/01/2018 in [facility] Cardiac and Pulmonary Rehab  Date  05/30/18  Educator  [facility]  Instruction Review Code  1- Verbalizes Understanding      Diabetes: - Individual verbal and written instruction to review signs/symptoms of diabetes, desired ranges of glucose level fasting, after meals and with exercise. Acknowledge that pre and post exercise glucose checks will be done for 3 sessions at entry  of program.   Cardiac Rehab from 08/01/2018 in [facility] Card[facility] and Pulmonary Rehab  Date  05/30/18  Educator  [facility]  Instruction Review Code  1- Verbalizes Understanding      Know Your Numbers and Risk Factors: -Group verbal and written instruction about  important numbers in your health.  Discussion of what are risk factors and how they play a role in the disease process.  Review of Cholesterol, Blood Pressure, Diabetes, and BMI and the role they play in your overall health.   Sleep Hygiene: -Provides group verbal and written instruction about how sleep can affect your health.  Define sleep hygiene, discuss sleep cycles and impact of sleep habits. Review good sleep hygiene tips.    Other: -Provides group and verbal instruction on various topics (see comments)   Knowledge Questionnaire Score:   Core Components/Risk Factors/Patient Goals at Admission:   Core Components/Risk Factors/Patient Goals Review:  Goals and Risk Factor Review    Row Name 08/11/18 1007 08/23/18 1152 09/09/18 1154 10/03/18 1237 10/25/18 1127     Core Components/Risk Factors/Patient Goals Review   Personal Goals Review  Weight Management/Obesity;Diabetes;Hypertension;Lipids  Weight Management/Obesity;Diabetes;Hypertension  Weight Management/Obesity;Diabetes;Hypertension  Weight Management/Obesity;Diabetes;Hypertension  Weight Management/Obesity;Diabetes;Hypertension   Review  Seth Hardy is doing well at home. His weight is still coming down. He was down to 183.8 lbs today.  He is doing well with his blood pressures and continues to check them.  His blood sugars are have been a little more up and down since he is eating a little differently and not moving as much.  He was encouraged to try ot get more exercise.   Since going to the beach, Seth Hardy has not been as able to check his weight or pressures and he doesn't have the equipment down there.  Before he left he was down to 182lbs and his pressures were good.  Overall he is doing well.   Seth Hardy has been doing well.  When he got back home, his weight was still at 182lbs and holding steady.  His pressures and sugars have been good.  He continues to try to maintain.   Seth Hardy contineus to do well at home. His weight has been holding steady  and he is trying to stay healthy.    Seth Hardy is doing well.  He is at the beach.  He does not have a way to measure vitals down there, but is doing his best to stay healthy.    Expected Outcomes  Short: Continue to work on weight loss and sugars with more exercise. Long: Continue to monitor risk factors.   Short: Once home, check weight and pressures again.  Long: Continue to monitor risk factors.   Short: Continue to work on weight loss.  Long: Continue to monitor risk factors.   Short: Continue to work on weight loss.  Long: Continue to monitor risk factors.   Short: Continue to work on weight loss.  Long: Continue to stay on top of risk factors.      Core Components/Risk Factors/Patient Goals at Discharge (Final Review):  Goals and Risk Factor Review - 10/25/18 1127      Core Components/Risk Factors/Patient Goals Review   Personal Goals Review  Weight Management/Obesity;Diabetes;Hypertension    Review  Seth Hardy is doing well.  He is at the beach.  He does not have a way to measure vitals down there, but is doing his best to stay healthy.     Expected Outcomes  Short: Continue to work on weight loss.  Long: Continue to stay on top of risk factors.       ITP Comments: ITP Comments    Row Name 08/17/18 1144 12/07/18 1414 12/23/18 1149 02/01/19 1456     ITP Comments   30 day review. Continue with ITP unless directed changes by Medical Director chart review.  30 day review cycle restarting  after being closed since March 16 because of  Covid 19 pandemic. Program opened to patients on July 6. Not all have returned. ITP updated and sent to Medical Director for review,changes as needed and signature  Called to see if Seth Hardy is ready to return to on site Cardiac Rehab.  He is out of town and will call back  Pt never returned once we reopened after the COVID-19 closure.  Discharge ITP and  summary created and sent for review.       Comments: Discharge ITP

## 2019-02-08 ENCOUNTER — Other Ambulatory Visit: Payer: Self-pay

## 2019-02-08 ENCOUNTER — Ambulatory Visit (INDEPENDENT_AMBULATORY_CARE_PROVIDER_SITE_OTHER): Payer: PPO

## 2019-02-08 DIAGNOSIS — Z7901 Long term (current) use of anticoagulants: Secondary | ICD-10-CM

## 2019-02-08 DIAGNOSIS — I4891 Unspecified atrial fibrillation: Secondary | ICD-10-CM | POA: Diagnosis not present

## 2019-02-08 LAB — POCT INR: INR: 2.1 (ref 2.0–3.0)

## 2019-02-08 NOTE — Patient Instructions (Signed)
Please continue dosage of 1/2 tablet daily except 1 tablet on MONDAYS & FRIDAYS.  Recheck INR in 6 weeks. 

## 2019-02-21 DIAGNOSIS — G4733 Obstructive sleep apnea (adult) (pediatric): Secondary | ICD-10-CM | POA: Diagnosis not present

## 2019-02-21 DIAGNOSIS — R0689 Other abnormalities of breathing: Secondary | ICD-10-CM | POA: Diagnosis not present

## 2019-02-21 DIAGNOSIS — R011 Cardiac murmur, unspecified: Secondary | ICD-10-CM | POA: Diagnosis not present

## 2019-02-21 DIAGNOSIS — I2510 Atherosclerotic heart disease of native coronary artery without angina pectoris: Secondary | ICD-10-CM | POA: Diagnosis not present

## 2019-03-22 ENCOUNTER — Ambulatory Visit (INDEPENDENT_AMBULATORY_CARE_PROVIDER_SITE_OTHER): Payer: PPO

## 2019-03-22 ENCOUNTER — Other Ambulatory Visit: Payer: Self-pay

## 2019-03-22 DIAGNOSIS — Z7901 Long term (current) use of anticoagulants: Secondary | ICD-10-CM

## 2019-03-22 DIAGNOSIS — I4891 Unspecified atrial fibrillation: Secondary | ICD-10-CM

## 2019-03-22 LAB — POCT INR: INR: 2 (ref 2.0–3.0)

## 2019-03-22 NOTE — Patient Instructions (Signed)
Please continue dosage of 1/2 tablet daily except 1 tablet on MONDAYS & FRIDAYS.  Recheck INR in 6 weeks. 

## 2019-03-23 DIAGNOSIS — G4733 Obstructive sleep apnea (adult) (pediatric): Secondary | ICD-10-CM | POA: Diagnosis not present

## 2019-03-23 DIAGNOSIS — R011 Cardiac murmur, unspecified: Secondary | ICD-10-CM | POA: Diagnosis not present

## 2019-03-23 DIAGNOSIS — R0689 Other abnormalities of breathing: Secondary | ICD-10-CM | POA: Diagnosis not present

## 2019-03-23 DIAGNOSIS — I2510 Atherosclerotic heart disease of native coronary artery without angina pectoris: Secondary | ICD-10-CM | POA: Diagnosis not present

## 2019-03-24 ENCOUNTER — Other Ambulatory Visit: Payer: Self-pay | Admitting: Cardiovascular Disease

## 2019-03-24 NOTE — Telephone Encounter (Signed)
Please schedule overdue F/U with Dr. [name]. Thank you! 

## 2019-03-27 NOTE — Telephone Encounter (Signed)
Please schedule overdue F/U with Dr. [name]. Thank you! 

## 2019-03-29 NOTE — Telephone Encounter (Signed)
Scheduled

## 2019-04-04 DIAGNOSIS — E1165 Type 2 diabetes mellitus with hyperglycemia: Secondary | ICD-10-CM | POA: Diagnosis not present

## 2019-04-04 DIAGNOSIS — E538 Deficiency of other specified B group vitamins: Secondary | ICD-10-CM | POA: Diagnosis not present

## 2019-04-04 DIAGNOSIS — E1139 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: Secondary | ICD-10-CM | POA: Diagnosis not present

## 2019-04-04 DIAGNOSIS — Z125 Encounter for screening for malignant neoplasm of prostate: Secondary | ICD-10-CM | POA: Diagnosis not present

## 2019-04-11 DIAGNOSIS — Z0000 Encounter for general adult medical examination without abnormal findings: Secondary | ICD-10-CM | POA: Diagnosis not present

## 2019-04-11 DIAGNOSIS — E1139 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: Secondary | ICD-10-CM | POA: Diagnosis not present

## 2019-04-11 DIAGNOSIS — G4733 Obstructive sleep apnea (adult) (pediatric): Secondary | ICD-10-CM | POA: Diagnosis not present

## 2019-04-11 DIAGNOSIS — E1165 Type 2 diabetes mellitus with hyperglycemia: Secondary | ICD-10-CM | POA: Diagnosis not present

## 2019-04-11 DIAGNOSIS — D500 Iron deficiency anemia secondary to blood loss (chronic): Secondary | ICD-10-CM | POA: Diagnosis not present

## 2019-04-11 DIAGNOSIS — E538 Deficiency of other specified B group vitamins: Secondary | ICD-10-CM | POA: Diagnosis not present

## 2019-04-11 DIAGNOSIS — Z9989 Dependence on other enabling machines and devices: Secondary | ICD-10-CM | POA: Diagnosis not present

## 2019-04-23 DIAGNOSIS — R011 Cardiac murmur, unspecified: Secondary | ICD-10-CM | POA: Diagnosis not present

## 2019-04-23 DIAGNOSIS — I2510 Atherosclerotic heart disease of native coronary artery without angina pectoris: Secondary | ICD-10-CM | POA: Diagnosis not present

## 2019-04-23 DIAGNOSIS — G4733 Obstructive sleep apnea (adult) (pediatric): Secondary | ICD-10-CM | POA: Diagnosis not present

## 2019-04-23 DIAGNOSIS — R0689 Other abnormalities of breathing: Secondary | ICD-10-CM | POA: Diagnosis not present

## 2019-05-02 ENCOUNTER — Ambulatory Visit: Payer: PPO | Admitting: Internal Medicine

## 2019-05-02 ENCOUNTER — Other Ambulatory Visit: Payer: PPO

## 2019-05-02 ENCOUNTER — Ambulatory Visit: Payer: PPO

## 2019-05-03 ENCOUNTER — Ambulatory Visit (INDEPENDENT_AMBULATORY_CARE_PROVIDER_SITE_OTHER): Payer: PPO

## 2019-05-03 ENCOUNTER — Other Ambulatory Visit: Payer: Self-pay

## 2019-05-03 DIAGNOSIS — I4891 Unspecified atrial fibrillation: Secondary | ICD-10-CM | POA: Diagnosis not present

## 2019-05-03 DIAGNOSIS — Z7901 Long term (current) use of anticoagulants: Secondary | ICD-10-CM

## 2019-05-03 DIAGNOSIS — I350 Nonrheumatic aortic (valve) stenosis: Secondary | ICD-10-CM

## 2019-05-03 LAB — POCT INR: INR: 2.1 (ref 2.0–3.0)

## 2019-05-03 NOTE — Patient Instructions (Signed)
Please continue dosage of 1/2 tablet daily except 1 tablet on MONDAYS & FRIDAYS.  Recheck INR in 6 weeks. 

## 2019-05-04 ENCOUNTER — Ambulatory Visit: Payer: PPO

## 2019-05-04 ENCOUNTER — Other Ambulatory Visit: Payer: PPO

## 2019-05-04 ENCOUNTER — Ambulatory Visit: Payer: PPO | Admitting: Internal Medicine

## 2019-05-09 ENCOUNTER — Other Ambulatory Visit: Payer: Self-pay

## 2019-05-10 ENCOUNTER — Inpatient Hospital Stay: Payer: PPO | Attending: Internal Medicine

## 2019-05-10 ENCOUNTER — Other Ambulatory Visit: Payer: Self-pay

## 2019-05-10 ENCOUNTER — Inpatient Hospital Stay (HOSPITAL_BASED_OUTPATIENT_CLINIC_OR_DEPARTMENT_OTHER): Payer: PPO | Admitting: Internal Medicine

## 2019-05-10 ENCOUNTER — Inpatient Hospital Stay: Payer: PPO

## 2019-05-10 DIAGNOSIS — I2510 Atherosclerotic heart disease of native coronary artery without angina pectoris: Secondary | ICD-10-CM | POA: Insufficient documentation

## 2019-05-10 DIAGNOSIS — G4730 Sleep apnea, unspecified: Secondary | ICD-10-CM | POA: Diagnosis not present

## 2019-05-10 DIAGNOSIS — D72819 Decreased white blood cell count, unspecified: Secondary | ICD-10-CM | POA: Diagnosis not present

## 2019-05-10 DIAGNOSIS — I129 Hypertensive chronic kidney disease with stage 1 through stage 4 chronic kidney disease, or unspecified chronic kidney disease: Secondary | ICD-10-CM | POA: Insufficient documentation

## 2019-05-10 DIAGNOSIS — K219 Gastro-esophageal reflux disease without esophagitis: Secondary | ICD-10-CM | POA: Diagnosis not present

## 2019-05-10 DIAGNOSIS — Z8673 Personal history of transient ischemic attack (TIA), and cerebral infarction without residual deficits: Secondary | ICD-10-CM | POA: Diagnosis not present

## 2019-05-10 DIAGNOSIS — Z8052 Family history of malignant neoplasm of bladder: Secondary | ICD-10-CM | POA: Diagnosis not present

## 2019-05-10 DIAGNOSIS — Z9884 Bariatric surgery status: Secondary | ICD-10-CM | POA: Insufficient documentation

## 2019-05-10 DIAGNOSIS — Z79899 Other long term (current) drug therapy: Secondary | ICD-10-CM | POA: Insufficient documentation

## 2019-05-10 DIAGNOSIS — E119 Type 2 diabetes mellitus without complications: Secondary | ICD-10-CM | POA: Insufficient documentation

## 2019-05-10 DIAGNOSIS — Z85828 Personal history of other malignant neoplasm of skin: Secondary | ICD-10-CM | POA: Insufficient documentation

## 2019-05-10 DIAGNOSIS — Z794 Long term (current) use of insulin: Secondary | ICD-10-CM | POA: Diagnosis not present

## 2019-05-10 DIAGNOSIS — N189 Chronic kidney disease, unspecified: Secondary | ICD-10-CM | POA: Insufficient documentation

## 2019-05-10 DIAGNOSIS — I480 Paroxysmal atrial fibrillation: Secondary | ICD-10-CM | POA: Diagnosis not present

## 2019-05-10 DIAGNOSIS — E785 Hyperlipidemia, unspecified: Secondary | ICD-10-CM | POA: Insufficient documentation

## 2019-05-10 DIAGNOSIS — D696 Thrombocytopenia, unspecified: Secondary | ICD-10-CM | POA: Diagnosis not present

## 2019-05-10 DIAGNOSIS — D500 Iron deficiency anemia secondary to blood loss (chronic): Secondary | ICD-10-CM | POA: Diagnosis not present

## 2019-05-10 DIAGNOSIS — D509 Iron deficiency anemia, unspecified: Secondary | ICD-10-CM | POA: Insufficient documentation

## 2019-05-10 DIAGNOSIS — Z7982 Long term (current) use of aspirin: Secondary | ICD-10-CM | POA: Insufficient documentation

## 2019-05-10 DIAGNOSIS — I4891 Unspecified atrial fibrillation: Secondary | ICD-10-CM | POA: Diagnosis not present

## 2019-05-10 DIAGNOSIS — Q758 Other specified congenital malformations of skull and face bones: Secondary | ICD-10-CM | POA: Diagnosis not present

## 2019-05-10 DIAGNOSIS — Z801 Family history of malignant neoplasm of trachea, bronchus and lung: Secondary | ICD-10-CM | POA: Diagnosis not present

## 2019-05-10 LAB — CBC WITH DIFFERENTIAL/PLATELET
Abs Immature Granulocytes: 0 10*3/uL (ref 0.00–0.07)
Basophils Absolute: 0 10*3/uL (ref 0.0–0.1)
Basophils Relative: 0 %
Eosinophils Absolute: 0.1 10*3/uL (ref 0.0–0.5)
Eosinophils Relative: 3 %
HCT: 36.1 % — ABNORMAL LOW (ref 39.0–52.0)
Hemoglobin: 11.3 g/dL — ABNORMAL LOW (ref 13.0–17.0)
Immature Granulocytes: 0 %
Lymphocytes Relative: 24 %
Lymphs Abs: 0.7 10*3/uL (ref 0.7–4.0)
MCH: 31.9 pg (ref 26.0–34.0)
MCHC: 31.3 g/dL (ref 30.0–36.0)
MCV: 102 fL — ABNORMAL HIGH (ref 80.0–100.0)
Monocytes Absolute: 0.4 10*3/uL (ref 0.1–1.0)
Monocytes Relative: 13 %
Neutro Abs: 1.8 10*3/uL (ref 1.7–7.7)
Neutrophils Relative %: 60 %
Platelets: 159 10*3/uL (ref 150–400)
RBC: 3.54 MIL/uL — ABNORMAL LOW (ref 4.22–5.81)
RDW: 13.3 % (ref 11.5–15.5)
WBC: 3.1 10*3/uL — ABNORMAL LOW (ref 4.0–10.5)
nRBC: 0 % (ref 0.0–0.2)

## 2019-05-10 NOTE — Assessment & Plan Note (Addendum)
#  Anemia iron deficient-likely secondary to gastric losses versus others. Hemoglobin is 11.8; HOLD off IV iron.  #Hemoglobin overall stable; asymptomatic.  Awaiting capsule study with Dr.Mann [will need to be off plavix].  HOLD IV Feraheme today.  # Leucopenia 3.0/ neutropenia 1.9/thrombocytopenia-150s.  Intermittent.  Stable.  # CAD s/p stenting- on asprin+ plavix x 6 months; A.fib- on Coumadin. Awaiting evaluation next month. .  # DISPOSITION:  # HOLD IV ferrahem today; # follow up in 4 months/cbc/iron studies/ferritin/bmp;possible IV ferrahem- Dr.B

## 2019-05-10 NOTE — Progress Notes (Signed)
[facility] OFFICE PROGRESS NOTE  Patient Care Team: Rusty Aus, MD as PCP - General (Internal Medicine)   SUMMARY OF ONCOLOGIC HISTORY:  # MARCH 2017- April 2017- IRON DEFICIENCY ANEMIA ? Etiology s/p IV iron [EGD- ?June 2017- Bleeding gastric "polyp"/ colo- Dr.Gessner]; recommend CAPSULE STUDY [on HOLD sec to ?plavix]  # hx of A.fib- on coumadin-HOLD April 2017/ CAD [Dr.[name]]; Hx of gastric ulcer [Dr.Gessner];   TIA/ CEA [s/p April 2017]; OSA on CPAP.   History of present illness:   73 -year-old Caucasian male patient is here for follow-up of his severe iron deficiency anemia-;also on Coumadin for A. fib is here for follow-up.   Patient complains of black stool attributed to iron pills.  Is still awaiting to come off Plavix to proceed with capsule study.  Review of Systems  Constitutional: Negative for chills, diaphoresis and fever.  HENT: Negative for nosebleeds and sore throat.   Eyes: Negative for double vision.  Respiratory: Negative for cough, hemoptysis, sputum production and wheezing.   Cardiovascular: Negative for chest pain, palpitations, orthopnea and leg swelling.  Gastrointestinal: Negative for abdominal pain, blood in stool, constipation, diarrhea, heartburn, melena, nausea and vomiting.  Genitourinary: Negative for dysuria, frequency and urgency.  Musculoskeletal: Negative for back pain and joint pain.  Skin: Negative.  Negative for itching and rash.  Neurological: Negative for dizziness, tingling, focal weakness, weakness and headaches.  Endo/Heme/Allergies: Does not bruise/bleed easily.  Psychiatric/Behavioral: Negative for depression. The patient is not nervous/anxious and does not have insomnia.     Past Medical History:  Diagnosis Date  . Anemia 08/2015   received 2 units rbc one week ago, 3 IV iron infusion -last 1 week.  . Basal cell carcinoma of skin 2012   removed several spots from arms and back  . CAD (coronary artery disease)    a.  01/2010 CABG x 4: LIMA->LAD, VG->D1, VG->OM1, VG->PDA. b. NSTEMI 02/2012 in setting of AF-RVR with cath s/p BMS to RCA (plan for 1 month, possibly up to 3 months of Plavix)  . Carotid disease, bilateral ([place])    a. 03/2011 U/S: 40-59% bilat Carotid dzs;  b. 04/2015 Carotid U/S: 40-59% bilat ICA stenosis; c. 05/2015 CTA Neck: [id number] RICA, [id number] LICA, mod-marked R vertebral stenosis, mod L vertebral stenosis.  . Chronic kidney disease    small obtusion per pcp. ultrasound done on 08/29/2015  . Diabetes mellitus type II, controlled ([place])    a. Variable CBG 02/2012 - several meds adjusted.  Marland Kitchen Dysrhythmia    intermittent Atrial Fibrillation  . GERD (gastroesophageal reflux disease)   . Heart murmur   . Hyperlipidemia   . Hypertension   . Hypertensive heart disease   . Iron deficiency anemia    "gets infusions q once in awhile" (04/27/2018)  . Macular edema bil   lazer work done previously  . Mild aortic stenosis    a. 03/2011 Echo: EF 55-60%, Mild AS. b. Mild by cath 02/2012; c. 01/2014 Echo: EF 65-70%, Gr 1 DD, mild AS, mildly dil LA.  . Multiple gastric ulcers 2006  . Myocardial infarction ([place]) 02/2012   stents (x1) at time  . Myocardial infarction [facility])    "2nd one was after 02/2012; don't know date" (04/27/2018)  . Obesity   . On home oxygen therapy    "2.5 w/CPAP" (04/27/2018)  . OSA on CPAP    CPAP settings 3 with oxygen 2.5 (04/27/2018)  . PAF (paroxysmal atrial fibrillation) ([place])    a. Newly  dx 02/2012 & initiated on Coumadin (spont converted to NSR).  . Shortness of breath dyspnea   . Transfusion history    transfusions -1 month ago -2 units    Past Surgical History:  Procedure Laterality Date  . ANGIOPLASTY    . BASAL CELL CARCINOMA EXCISION     "shoulder, arm X 2"  . CARDIAC CATHETERIZATION  09-01-12  . CATARACT EXTRACTION W/ INTRAOCULAR LENS  IMPLANT, BILATERAL Bilateral 09-02-14  . COLONOSCOPY WITH PROPOFOL N/A 10/15/2015   Procedure: COLONOSCOPY WITH PROPOFOL;  Surgeon: Gatha Mayer, MD;  Location: [facility] ENDOSCOPY;  Service: Endoscopy;  Laterality: N/A;  . CORONARY ANGIOPLASTY     Dr. Fletcher Anon  . CORONARY ARTERY BYPASS GRAFT  Sep 01, 2009   CABG X3-4; [place], [place], [place],   . CORONARY ATHERECTOMY  04/27/2018  . CORONARY ATHERECTOMY N/A 04/27/2018   Procedure: CORONARY ATHERECTOMY;  Surgeon: Belva Crome, MD;  Location: [facility] CV LAB;  Service: Cardiovascular;  Laterality: N/A;  . cyst L kidney  [place]  . ENDARTERECTOMY Left 08/30/2015   Procedure: ENDARTERECTOMY CAROTID;  Surgeon: Katha Cabal, MD;  Location: [facility] ORS;  Service: Vascular;  Laterality: Left;  . ESOPHAGOGASTRODUODENOSCOPY (EGD) WITH PROPOFOL N/A 10/15/2015   Procedure: ESOPHAGOGASTRODUODENOSCOPY (EGD) WITH PROPOFOL;  Surgeon: Gatha Mayer, MD;  Location: [facility] ENDOS[facility];  Service: Endoscopy;  Laterality: N/A;  . EYE SURGERY Bilateral    "has macular edema cauterized" (04/27/2018)  . INGUINAL HERNIA REPAIR Right   . LEFT HEART CATHETERIZATION WITH CORONARY ANGIOGRAM N/A 03/09/2012   Procedure: LEFT HEART CATHETERIZATION WITH CORONARY ANGIOGRAM;  Surgeon: Wellington Hampshire, MD;  Location: [facility] CATH LAB;  Service: Cardiovascular;  Laterality: N/A;  . PERCUTANEOUS CORONARY STENT INTERVENTION (PCI-S)  03/09/2012   Procedure: PERCUTANEOUS CORONARY STENT INTERVENTION (PCI-S);  Surgeon: Wellington Hampshire, MD;  Location: [facility] CATH LAB;  Service: Cardiovascular;;  . RIGHT/LEFT HEART CATH AND CORONARY ANGIOGRAPHY N/A 04/25/2018   Procedure: RIGHT/LEFT HEART CATH AND CORONARY ANGIOGRAPHY;  Surgeon: Wellington Hampshire, MD;  Location: [place] CV LAB;  Service: Cardiovascular;  Laterality: N/A;    Family History  Problem Relation Age of Onset  . COPD Mother        alive 70  . Bladder Cancer Mother   . Heart attack Father        09/01/13 deceased  . Lung cancer Maternal Aunt   . Liver cancer Maternal Aunt   . Kidney disease Paternal Uncle   . Cancer Other        all paternal aunts and uncles  .  Prostate cancer Neg Hx   . Kidney cancer Neg Hx     SOCIAL HISTORY:   Social History  Substance Use Topics  . Smoking status: Never Smoker   . Smokeless tobacco: Never Used     Comment: tobacco use - no  . Alcohol Use: Yes     Comment: rare drink    ALLERGIES:  has No Known Allergies.  MEDICATIONS:  Current Outpatient Prescriptions  Medication Sig Dispense Refill  . acetaminophen (TYLENOL) 325 MG tablet Take 650 mg by mouth every 6 (six) hours as needed. For pain    . amiodarone (PACERONE) 200 MG tablet Take 0.5 tablets (100 mg total) by mouth daily. (Patient taking differently: Take 100 mg by mouth at bedtime. ) 45 tablet 3  . amLODipine (NORVASC) 5 MG tablet Take one tablet by mouth one time daily 90 tablet 3  . aspirin 81 MG EC  tablet Take 81 mg by mouth daily.      Marland Kitchen atorvastatin (LIPITOR) 20 MG tablet Take 1 tablet (20 mg total) by mouth at bedtime. 90 tablet 3  . furosemide (LASIX) 20 MG tablet Take 1 tablet (20 mg total) by mouth as needed. 30 tablet 6  . glimepiride (AMARYL) 4 MG tablet Take 4 mg by mouth 2 (two) times daily.      . insulin glargine (LANTUS) 100 UNIT/ML injection Inject 20 Units into the skin daily.     . insulin lispro (HUMALOG) 100 UNIT/ML injection Inject 8 Units into the skin 2 (two) times daily with a meal. (Patient taking differently: Inject into the skin 3 (three) times daily. Per sliding scale.)    . metFORMIN (GLUCOPHAGE) 500 MG tablet Take 500 mg by mouth 2 (two) times daily with a meal.    . nitroGLYCERIN (NITROSTAT) 0.4 MG SL tablet Place 1 tablet (0.4 mg total) under the tongue every 5 (five) minutes as needed (up to 3 doses). 25 tablet 1  . OXYGEN Inhale 2.5 L/min into the lungs at bedtime. With CPAP    . pantoprazole (PROTONIX) 40 MG tablet Take 1 tablet (40 mg total) by mouth daily. (Patient taking differently: Take 40 mg by mouth 2 (two) times daily. ) 90 tablet 3  . ramipril (ALTACE) 10 MG capsule Take 1 capsule (10 mg total) by mouth daily.  90 capsule 5  . warfarin (COUMADIN) 5 MG tablet Take as directed by anticoagulation clinic 90 tablet 1  . IRON-VITAMIN C PO Take 1 tablet by mouth daily. Reported on 08/27/2015    . Omega-3 Fatty Acids (FISH OIL) 1200 MG CAPS Take 1 capsule by mouth daily. Reported on 08/27/2015     No current facility-administered medications for this visit.    PHYSICAL EXAMINATION:   BP 158/71 mmHg  Pulse 51  Temp(Src) 96.1 F (35.6 C)  Resp 18  Wt 192 lb 0.3 oz (87.1 kg)  Filed Weights   08/27/15 1015  Weight: 192 lb 0.3 oz (87.1 kg)    Physical Exam  Constitutional: He is oriented to person, place, and time and well-developed, well-nourished, and in no distress.  Accompanied by his wife.  HENT:  Head: Normocephalic and atraumatic.  Mouth/Throat: Oropharynx is clear and moist. No oropharyngeal exudate.  Eyes: Pupils are equal, round, and reactive to light.  Cardiovascular: Normal rate and regular rhythm.  Pulmonary/Chest: No respiratory distress. He has no wheezes.  Abdominal: Soft. Bowel sounds are normal. He exhibits no distension and no mass. There is no abdominal tenderness. There is no rebound and no guarding.  Musculoskeletal:        General: No tenderness or edema. Normal range of motion.     Cervical back: Normal range of motion and neck supple.  Neurological: He is alert and oriented to person, place, and time.  Skin: Skin is warm.  Psychiatric: Affect normal.     LABORATORY DATA:  I have reviewed the data as listed    Component Value Date/Time   NA 139 07/25/2015 1138   NA 139 01/22/2013 0348   K 4.1 07/25/2015 1138   K 4.2 01/22/2013 0348   CL 103 07/25/2015 1138   CL 106 01/22/2013 0348   CO2 27 07/25/2015 1138   CO2 28 01/22/2013 0348   GLUCOSE 216* 07/25/2015 1138   GLUCOSE 158* 01/22/2013 0348   BUN 14 07/25/2015 1138   BUN 16 01/22/2013 0348   CREATININE 1.03 07/25/2015 1138   CREATININE  1.02 01/22/2013 0348   CALCIUM 9.1 07/25/2015 1138   CALCIUM 8.6  01/22/2013 0348   PROT 7.2 06/25/2015 1045   PROT 7.5 01/21/2013 0209   ALBUMIN 4.1 06/25/2015 1045   ALBUMIN 4.3 01/21/2013 0209   AST 30 06/25/2015 1045   AST 28 01/21/2013 0209   ALT 33 06/25/2015 1045   ALT 36 01/21/2013 0209   ALKPHOS 104 06/25/2015 1045   ALKPHOS 143* 01/21/2013 0209   BILITOT 0.4 06/25/2015 1045   BILITOT 0.4 01/21/2013 0209   GFRNONAA >60 07/25/2015 1138   GFRNONAA >60 01/22/2013 0348   GFRAA >60 07/25/2015 1138   GFRAA >60 01/22/2013 0348    No results found for: SPEP, UPEP  Lab Results  Component Value Date   WBC 4.5 08/22/2015   NEUTROABS 2.5 08/22/2015   HGB 8.0 Repeated and verified X2.* 08/22/2015   HCT 26.2 Repeated and verified X2.* 08/22/2015   MCV 67.4 Repeated and verified X2.* 08/22/2015   PLT 264.0 08/22/2015      Chemistry      Component Value Date/Time   NA 139 07/25/2015 1138   NA 139 01/22/2013 0348   K 4.1 07/25/2015 1138   K 4.2 01/22/2013 0348   CL 103 07/25/2015 1138   CL 106 01/22/2013 0348   CO2 27 07/25/2015 1138   CO2 28 01/22/2013 0348   BUN 14 07/25/2015 1138   BUN 16 01/22/2013 0348   CREATININE 1.03 07/25/2015 1138   CREATININE 1.02 01/22/2013 0348      Component Value Date/Time   CALCIUM 9.1 07/25/2015 1138   CALCIUM 8.6 01/22/2013 0348   ALKPHOS 104 06/25/2015 1045   ALKPHOS 143* 01/21/2013 0209   AST 30 06/25/2015 1045   AST 28 01/21/2013 0209   ALT 33 06/25/2015 1045   ALT 36 01/21/2013 0209   BILITOT 0.4 06/25/2015 1045   BILITOT 0.4 01/21/2013 0209        ASSESSMENT & PLAN:   Iron deficiency anemia due to chronic blood loss #Anemia iron deficient-likely secondary to gastric losses versus others. Hemoglobin is 11.8; HOLD off IV iron.  #Hemoglobin overall stable; asymptomatic.  Awaiting capsule study with Dr.Mann [will need to be off plavix].  HOLD IV Feraheme today.  # Leucopenia 3.0/ neutropenia 1.9/thrombocytopenia-150s.  Intermittent.  Stable.  # CAD s/p stenting- on asprin+ plavix x  6 months; A.fib- on Coumadin. Awaiting evaluation next month. .  # DISPOSITION:  # HOLD IV ferrahem today; # follow up in 4 months/cbc/iron studies/ferritin/bmp;possible IV ferrahem- Dr.B

## 2019-05-11 DIAGNOSIS — E1165 Type 2 diabetes mellitus with hyperglycemia: Secondary | ICD-10-CM | POA: Diagnosis not present

## 2019-05-11 DIAGNOSIS — E1139 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: Secondary | ICD-10-CM | POA: Diagnosis not present

## 2019-05-23 DIAGNOSIS — R011 Cardiac murmur, unspecified: Secondary | ICD-10-CM | POA: Diagnosis not present

## 2019-05-23 DIAGNOSIS — G4733 Obstructive sleep apnea (adult) (pediatric): Secondary | ICD-10-CM | POA: Diagnosis not present

## 2019-05-23 DIAGNOSIS — R0689 Other abnormalities of breathing: Secondary | ICD-10-CM | POA: Diagnosis not present

## 2019-05-23 DIAGNOSIS — I2510 Atherosclerotic heart disease of native coronary artery without angina pectoris: Secondary | ICD-10-CM | POA: Diagnosis not present

## 2019-05-25 DIAGNOSIS — G4733 Obstructive sleep apnea (adult) (pediatric): Secondary | ICD-10-CM | POA: Diagnosis not present

## 2019-05-26 HISTORY — PX: CARDIAC CATHETERIZATION: SHX172

## 2019-06-01 ENCOUNTER — Telehealth: Payer: Self-pay | Admitting: Cardiovascular Disease

## 2019-06-01 NOTE — Telephone Encounter (Signed)

## 2019-06-02 ENCOUNTER — Ambulatory Visit: Payer: PPO | Admitting: Cardiovascular Disease

## 2019-06-02 ENCOUNTER — Telehealth (INDEPENDENT_AMBULATORY_CARE_PROVIDER_SITE_OTHER): Payer: PPO | Admitting: Cardiovascular Disease

## 2019-06-02 ENCOUNTER — Other Ambulatory Visit: Payer: Self-pay

## 2019-06-02 VITALS — BP 156/61 | HR 58 | Ht 68.0 in | Wt 183.0 lb

## 2019-06-02 DIAGNOSIS — I350 Nonrheumatic aortic (valve) stenosis: Secondary | ICD-10-CM

## 2019-06-02 DIAGNOSIS — I2510 Atherosclerotic heart disease of native coronary artery without angina pectoris: Secondary | ICD-10-CM | POA: Diagnosis not present

## 2019-06-02 DIAGNOSIS — I4819 Other persistent atrial fibrillation: Secondary | ICD-10-CM

## 2019-06-02 DIAGNOSIS — I779 Disorder of arteries and arterioles, unspecified: Secondary | ICD-10-CM

## 2019-06-02 NOTE — Patient Instructions (Signed)
Medication Instructions:  Your physician recommends that you continue on your current medications as directed. Please refer to the Current Medication list given to you today.  *If you need a refill on your cardiac medications before your next appointment, please call your pharmacy*  Lab Work: None ordered If you have labs (blood work) drawn today and your tests are completely normal, you will receive your results only by: Marland Kitchen MyChart Message (if you have MyChart) OR . A paper copy in the mail If you have any lab test that is abnormal or we need to change your treatment, we will call you to review the results.  Testing/Procedures: Your physician has requested that you have an echocardiogram. Echocardiography is a painless test that uses sound waves to create images of your heart. It provides your doctor with information about the size and shape of your heart and how well your heart's chambers and valves are working. This procedure takes approximately one hour. There are no restrictions for this procedure. (To be scheduled in 3 months)  Follow-Up: At [facility], you and your health needs are our priority.  As part of our continuing mission to provide you with exceptional heart care, we have created designated Provider Care Teams.  These Care Teams include your primary Cardiologist (physician) and Advanced Practice Providers (APPs -  Physician Assistants and Nurse Practitioners) who all work together to provide you with the care you need, when you need it.  Your next appointment:   6 month(s)  The format for your next appointment:   In Person  Provider:    You may see Kathlyn Sacramento, MD or one of the following Advanced Practice Providers on your designated Care Team:    Murray Hodgkins, NP  Christell Faith, PA-C  Marrianne Mood, PA-C   Other Instructions  Echocardiogram An echocardiogram is a procedure that uses painless sound waves (ultrasound) to produce an image of the heart.  Images from an echocardiogram can provide important information about:  Signs of coronary artery disease (CAD).  Aneurysm detection. An aneurysm is a weak or damaged part of an artery wall that bulges out from the normal force of blood pumping through the body.  Heart size and shape. Changes in the size or shape of the heart can be associated with certain conditions, including heart failure, aneurysm, and CAD.  Heart muscle function.  Heart valve function.  Signs of a past heart attack.  Fluid buildup around the heart.  Thickening of the heart muscle.  A tumor or infectious growth around the heart valves. Tell a health care provider about:  Any allergies you have.  All medicines you are taking, including vitamins, herbs, eye drops, creams, and over-the-counter medicines.  Any blood disorders you have.  Any surgeries you have had.  Any medical conditions you have.  Whether you are pregnant or may be pregnant. What are the risks? Generally, this is a safe procedure. However, problems may occur, including:  Allergic reaction to dye (contrast) that may be used during the procedure. What happens before the procedure? No specific preparation is needed. You may eat and drink normally. What happens during the procedure?   An IV tube may be inserted into one of your veins.  You may receive contrast through this tube. A contrast is an injection that improves the quality of the pictures from your heart.  A gel will be applied to your chest.  A wand-like tool (transducer) will be moved over your chest. The gel will help  to transmit the sound waves from the transducer.  The sound waves will harmlessly bounce off of your heart to allow the heart images to be captured in real-time motion. The images will be recorded on a computer. The procedure may vary among health care providers and hospitals. What happens after the procedure?  You may return to your normal, everyday life,  including diet, activities, and medicines, unless your health care provider tells you not to do that. Summary  An echocardiogram is a procedure that uses painless sound waves (ultrasound) to produce an image of the heart.  Images from an echocardiogram can provide important information about the size and shape of your heart, heart muscle function, heart valve function, and fluid buildup around your heart.  You do not need to do anything to prepare before this procedure. You may eat and drink normally.  After the echocardiogram is completed, you may return to your normal, everyday life, unless your health care provider tells you not to do that. This information is not intended to replace advice given to you by your health care provider. Make sure you discuss any questions you have with your health care provider. Document Revised: 09/01/2018 Document Reviewed: 06/13/2016 Elsevier Patient Education  [place].

## 2019-06-02 NOTE — Progress Notes (Signed)
Virtual Visit via Video Note   This visit type was conducted due to national recommendations for restrictions regarding the COVID-19 Pandemic (e.g. social distancing) in an effort to limit this patient's exposure and mitigate transmission in our community.  Due to his co-morbid illnesses, this patient is at least at moderate risk for complications without adequate follow up.  This format is felt to be most appropriate for this patient at this time.  All issues noted in this document were discussed and addressed.  A limited physical exam was performed with this format.  Please refer to the patient's chart for his consent to telehealth for [facility].   Date:  06/02/2019   ID:  Seth Hardy, DOB 21-Sep-1945, MRN [id number]  Patient Location: Home Provider Location: Office  PCP:  Rusty Aus, MD  Cardiologist:  Kathlyn Sacramento, MD  Electrophysiologist:  None   Evaluation Performed:  Follow-Up Visit  Chief Complaint: Doing well with no complaints.  History of Present Illness:    Seth Hardy is a 74 y.o. male was seen via video visit for a follow-up regarding multiple cardiac issues. He has a history of coronary artery disease and is status CABG in September 2011 with subsequent bare metal stenting of the right coronary artery in 2013. He also has a history of persistent atrial fibrillation maintained in sinus rhythm on small dose amiodarone and is chronically anticoagulated on Coumadin. He has other chronic medical conditions that include aortic stenosis, hypertension, hyperlipidemia, bilateral carotid arterial disease status post left carotid endarterectomy in April 2017, and diabetes.   Most recent echocardiogram in July 2018 showed normal ejection fraction with mild aortic stenosis.  Mean gradient was 13 mmHg. He has known history of iron deficiency anemia due to gastric polyp which was resected.  He was seen in November of last year for worsening angina.  I repeated his  echocardiogram which showed normal LV systolic function.  Aortic stenosis was found to be moderate with a mean gradient of 27 mmHg. A right and left cardiac catheterization was performed in December 20 19 which showed significant underlying three-vessel coronary artery disease with patent LIMA to LAD and SVG to OM with known chronically occluded SVG to RCA.  Proximal RCA stent was patent with only mild in-stent restenosis.  However, he was found to have severe calcified stenosis of the distal RCA.  Right heart catheterization showed mildly elevated filling pressures with normal cardiac output.  Aortic stenosis was moderate with a mean gradient of 20 mmHg. The patient underwent staged orbital atherectomy of the distal right coronary artery with drug-eluting stent placement.  This was done on December 4.  He has been doing very well since then with no chest pain or shortness of breath.  He takes his medications regularly.  No bleeding side effects.   The patient does not have symptoms concerning for COVID-19 infection (fever, chills, cough, or new shortness of breath).    Past Medical History:  Diagnosis Date  . Anemia 08/2015   received 2 units rbc one week ago, 3 IV iron infusion -last 1 week.  . Basal cell carcinoma of skin 2012   removed several spots from arms and back  . CAD (coronary artery disease)    a. 01/2010 CABG x 4: LIMA->LAD, VG->D1, VG->OM1, VG->PDA. b. NSTEMI 02/2012 in setting of AF-RVR with cath s/p BMS to RCA (plan for 1 month, possibly up to 3 months of Plavix)  . Carotid disease, bilateral ([place])  a. 03/2011 U/S: 40-59% bilat Carotid dzs;  b. 04/2015 Carotid U/S: 40-59% bilat ICA stenosis; c. 05/2015 CTA Neck: [id number] RICA, [id number] LICA, mod-marked R vertebral stenosis, mod L vertebral stenosis.  . Chronic kidney disease    small obtusion per pcp. ultrasound done on 08/29/2015  . Diabetes mellitus type II, controlled ([place])    a. Variable CBG 02/2012 - several meds adjusted.  Marland Kitchen  Dysrhythmia    intermittent Atrial Fibrillation  . GERD (gastroesophageal reflux disease)   . Heart murmur   . Hyperlipidemia   . Hypertension   . Hypertensive heart disease   . Iron deficiency anemia    "gets infusions q once in awhile" (04/27/2018)  . Macular edema bil   lazer work done previously  . Mild aortic stenosis    a. 03/2011 Echo: EF 55-60%, Mild AS. b. Mild by cath 02/2012; c. 01/2014 Echo: EF 65-70%, Gr 1 DD, mild AS, mildly dil LA.  . Multiple gastric ulcers 2006  . Myocardial infarction ([place]) 02/2012   stents (x1) at time  . Myocardial infarction [facility])    "2nd one was after 02/2012; don't know date" (04/27/2018)  . Obesity   . On home oxygen therapy    "2.5 w/CPAP" (04/27/2018)  . OSA on CPAP    CPAP settings 3 with oxygen 2.5 (04/27/2018)  . PAF (paroxysmal atrial fibrillation) ([place])    a. Newly dx 02/2012 & initiated on Coumadin (spont converted to NSR).  . Shortness of breath dyspnea   . Transfusion history    transfusions -1 month ago -2 units   Past Surgical History:  Procedure Laterality Date  . ANGIOPLASTY    . BASAL CELL CARCINOMA EXCISION     "shoulder, arm X 2"  . CARDIAC CATHETERIZATION  2014  . CATARACT EXTRACTION W/ INTRAOCULAR LENS  IMPLANT, BILATERAL Bilateral 2016  . COLONOSCOPY WITH PROPOFOL N/A 10/15/2015   Procedure: COLONOSCOPY WITH PROPOFOL;  Surgeon: Gatha Mayer, MD;  Location: [facility] ENDOSCOPY;  Service: Endoscopy;  Laterality: N/A;  . CORONARY ANGIOPLASTY     Dr. Fletcher Anon  . CORONARY ARTERY BYPASS GRAFT  2011   CABG X3-4; [place], [place], [place],   . CORONARY ATHERECTOMY  04/27/2018  . CORONARY ATHERECTOMY N/A 04/27/2018   Procedure: CORONARY ATHERECTOMY;  Surgeon: Belva Crome, MD;  Location: [facility] CV LAB;  Service: Cardiovascular;  Laterality: N/A;  . cyst L kidney  [place]  . ENDARTERECTOMY Left 08/30/2015   Procedure: ENDARTERECTOMY CAROTID;  Surgeon: Katha Cabal, MD;  Location: [facility] ORS;  Service: Vascular;   Laterality: Left;  . ESOPHAGOGASTRODUODENOSCOPY (EGD) WITH PROPOFOL N/A 10/15/2015   Procedure: ESOPHAGOGASTRODUODENOSCOPY (EGD) WITH PROPOFOL;  Surgeon: Gatha Mayer, MD;  Location: [facility];  Service: Endoscopy;  Laterality: N/A;  . EYE SURGERY Bilateral    "has macular edema cauterized" (04/27/2018)  . INGUINAL HERNIA REPAIR Right   . LEFT HEART CATHETERIZATION WITH CORONARY ANGIOGRAM N/A 03/09/2012   Procedure: LEFT HEART CATHETERIZATION WITH CORONARY ANGIOGRAM;  Surgeon: Wellington Hampshire, MD;  Location: [facility] CATH LAB;  Service: Cardiovascular;  Laterality: N/A;  . PERCUTANEOUS CORONARY STENT INTERVENTION (PCI-S)  03/09/2012   Procedure: PERCUTANEOUS CORONARY STENT INTERVENTION (PCI-S);  Surgeon: Wellington Hampshire, MD;  Location: [facility] CATH LAB;  Service: Cardiovascular;;  . RIGHT/LEFT HEART CATH AND CORONARY ANGIOGRAPHY N/A 04/25/2018   Procedure: RIGHT/LEFT HEART CATH AND CORONARY ANGIOGRAPHY;  Surgeon: Wellington Hampshire, MD;  Location: [facility] CV LAB;  Service: Cardiovascular;  Laterality: N/A;  Current Meds  Medication Sig  . acetaminophen (TYLENOL) 500 MG tablet Take 1,000 mg by mouth every 6 (six) hours as needed for moderate pain or headache.  . ALPRAZolam (XANAX) 0.25 MG tablet Take 0.25 mg by mouth daily as needed for anxiety.  Marland Kitchen amiodarone (PACERONE) 200 MG tablet TAKE 1/2 TABLET AT BEDTIME (Patient taking differently: Take 100 mg by mouth every evening. )  . amLODipine (NORVASC) 5 MG tablet Take one tablet by mouth one time daily  . clopidogrel (PLAVIX) 75 MG tablet TAKE 1 TABLET BY MOUTH DAILY  . Cyanocobalamin (B-12) 1000 MCG CAPS Take 1,000 mcg by mouth every evening.   . Ferrous Gluconate-C-Folic Acid (IRON-C PO) Take 1 tablet by mouth daily.  . furosemide (LASIX) 20 MG tablet Take 1 tablet (20 mg total) by mouth daily. (Patient taking differently: Take 20 mg by mouth daily. Patient taking prn)  . glimepiride (AMARYL) 4 MG tablet Take 4 mg by mouth daily.   . insulin  glargine (LANTUS) 100 UNIT/ML injection Inject 20 Units into the skin daily.   . nitroGLYCERIN (NITROSTAT) 0.4 MG SL tablet PLACE 1 TABLET UNDER THE TONGUE EVERY 5 MINUTES FOR UP TO 3 DOSES AS NEEDED FOR CHEST PAINS, IF NO RELIEF CALL 911  . NOVOLOG FLEXPEN 100 UNIT/ML FlexPen Inject 6-36 Units into the skin 2 (two) times daily with a meal.   . omega-3 acid ethyl esters (LOVAZA) 1 g capsule Take 1 capsule (1 g total) by mouth daily.  . Omega-3 Fatty Acids (FISH OIL) 1000 MG CAPS Take 1,000 mg by mouth every evening.  . OXYGEN Inhale 2.5 L/min into the lungs at bedtime. With CPAP  . pantoprazole (PROTONIX) 40 MG tablet Take 1 tablet (40 mg total) by mouth daily.  . Polyethylene Glycol 400 (BLINK TEARS OP) Place 1 drop into both eyes daily as needed (dry eyes).  . ramipril (ALTACE) 10 MG capsule Take 10 mg by mouth daily.   . rosuvastatin (CRESTOR) 40 MG tablet Take 1 tablet (40 mg total) by mouth daily. *NEEDS OFFICE VISIT FOR FURTHER REFILLS-PLEASE CALL [phone] TO SCHEDULE. THANK YOU!*  . vitamin C (ASCORBIC ACID) 500 MG tablet Take 500 mg by mouth every evening.  . warfarin (COUMADIN) 5 MG tablet Take 2.5-5 mg by mouth See admin instructions. Take 5 mg at night on Sun, Mon, Wed, Thur, and Fri.  Take 2.5 mg at night on Tues and Sat  . zinc gluconate 50 MG tablet Take 50 mg by mouth daily.     Allergies:   Patient has no known allergies.   Social History   Tobacco Use  . Smoking status: Never Smoker  . Smokeless tobacco: Never Used  . Tobacco comment: tobacco use - no.no passive smoke in home  Substance Use Topics  . Alcohol use: Yes    Comment: 04/27/2018 "couple drinks/year; if that"  . Drug use: Never     Family Hx: [name] family history includes Bladder Cancer in his mother; COPD in his mother; Cancer in an other family member; Heart attack in his father; Kidney disease in his paternal uncle; Liver cancer in his maternal aunt; Lung cancer in his maternal aunt. There is no  history of Prostate cancer or Kidney cancer.  ROS:   Please see the history of present illness.     All other systems reviewed and are negative.   Prior CV studies:   The following studies were reviewed today:    Labs/Other Tests and Data Reviewed:  EKG:  No ECG reviewed.  Recent Labs: 01/02/2019: BUN 19; Creatinine, Ser 1.38; Potassium 4.8; Sodium 139 05/10/2019: Hemoglobin 11.3; Platelets 159   Recent Lipid Panel Lab Results  Component Value Date/Time   CHOL 99 03/09/2012 02:43 AM   TRIG 84 03/09/2012 02:43 AM   HDL 37 (L) 03/09/2012 02:43 AM   CHOLHDL 2.7 03/09/2012 02:43 AM   LDLCALC 45 03/09/2012 02:43 AM    Wt Readings from Last 3 Encounters:  06/02/19 183 lb (83 kg)  05/10/19 180 lb 8 oz (81.9 kg)  01/02/19 183 lb (83 kg)     Objective:    Vital Signs:  BP (!) 156/61 (BP Location: Right Arm, Patient Position: Sitting)   Pulse (!) 58   Ht 5\' 8"  (1.727 m)   Wt 183 lb (83 kg)   BMI 27.83 kg/m    VITAL SIGNS:  reviewed GEN:  no acute distress EYES:  sclerae anicteric, EOMI - Extraocular Movements Intact RESPIRATORY:  normal respiratory effort, symmetric expansion SKIN:  no rash, lesions or ulcers. MUSCULOSKELETAL:  no obvious deformities. NEURO:  alert and oriented x 3, no obvious focal deficit PSYCH:  normal affect  ASSESSMENT & PLAN:    1.  Coronary artery disease involving bypass graft without angina: He is doing very well at the present time with no anginal symptoms.  Continue Plavix without aspirin given that he is on warfarin. I am going to keep him on Plavix for another 6 months and then discontinue given that he is on warfarin.  2. Bilateral carotid artery stenosis: Status post  left carotid endarterectomy. Followed by Dr. Delana Meyer.  3. moderate aortic stenosis: I requested a repeat echocardiogram to be done in 3 months from now.  4. Persistent atrial fibrillation: Maintaining in sinus rhythm with small dose amiodarone.    Continue  anticoagulation with warfarin.    5. Hyperlipidemia: Continue high-dose rosuvastatin.  His LDL has been below 70.  COVID-19 Education: The signs and symptoms of COVID-19 were discussed with the patient and how to seek care for testing (follow up with PCP or arrange E-visit).  The importance of social distancing was discussed today.  Time:   Today, I have spent 8 minutes with the patient with telehealth technology discussing the above problems.     Medication Adjustments/Labs and Tests Ordered: Current medicines are reviewed at length with the patient today.  Concerns regarding medicines are outlined above.   Tests Ordered: No orders of the defined types were placed in this encounter.   Medication Changes: No orders of the defined types were placed in this encounter.   Follow Up:  In Person in 6 month(s)  Signed, Kathlyn Sacramento, MD  06/02/2019 2:19 PM    [place]

## 2019-06-05 ENCOUNTER — Other Ambulatory Visit: Payer: Self-pay | Admitting: Cardiovascular Disease

## 2019-06-13 ENCOUNTER — Ambulatory Visit: Payer: PPO | Admitting: Cardiovascular Disease

## 2019-06-14 ENCOUNTER — Other Ambulatory Visit: Payer: Self-pay

## 2019-06-14 ENCOUNTER — Ambulatory Visit (INDEPENDENT_AMBULATORY_CARE_PROVIDER_SITE_OTHER): Payer: PPO

## 2019-06-14 DIAGNOSIS — Z7901 Long term (current) use of anticoagulants: Secondary | ICD-10-CM

## 2019-06-14 DIAGNOSIS — I4891 Unspecified atrial fibrillation: Secondary | ICD-10-CM

## 2019-06-14 LAB — POCT INR: INR: 2.4 (ref 2.0–3.0)

## 2019-06-14 NOTE — Patient Instructions (Signed)
Please continue dosage of 1/2 tablet daily except 1 tablet on MONDAYS & FRIDAYS.  Recheck INR in 6 weeks. 

## 2019-06-16 ENCOUNTER — Ambulatory Visit: Payer: PPO

## 2019-06-17 ENCOUNTER — Ambulatory Visit: Payer: PPO | Attending: Internal Medicine

## 2019-06-17 DIAGNOSIS — Z23 Encounter for immunization: Secondary | ICD-10-CM

## 2019-06-17 NOTE — Progress Notes (Signed)
   Covid-19 Vaccination Clinic  Name:  Seth Hardy    MRN: [id number] DOB: [birth year]  06/17/2019  [name] was observed post Covid-19 immunization for 15 minutes without incidence. He was provided with Vaccine Information Sheet and instruction to access the V-Safe system.   [name] was instructed to call 911 with any severe reactions post vaccine: Marland Kitchen Difficulty breathing  . Swelling of your face and throat  . A fast heartbeat  . A bad rash all over your body  . Dizziness and weakness    Immunizations Administered    Name Date Dose VIS Date Route   Pfizer COVID-19 Vaccine 06/17/2019  4:09 PM 0.3 mL 05/05/2019 Intramuscular   Manufacturer: [place]   Lot: [id number]   [place]: [id number]

## 2019-06-23 DIAGNOSIS — R0689 Other abnormalities of breathing: Secondary | ICD-10-CM | POA: Diagnosis not present

## 2019-06-23 DIAGNOSIS — G4733 Obstructive sleep apnea (adult) (pediatric): Secondary | ICD-10-CM | POA: Diagnosis not present

## 2019-06-23 DIAGNOSIS — R011 Cardiac murmur, unspecified: Secondary | ICD-10-CM | POA: Diagnosis not present

## 2019-06-23 DIAGNOSIS — I2510 Atherosclerotic heart disease of native coronary artery without angina pectoris: Secondary | ICD-10-CM | POA: Diagnosis not present

## 2019-07-11 ENCOUNTER — Ambulatory Visit: Payer: PPO

## 2019-07-11 ENCOUNTER — Telehealth: Payer: Self-pay | Admitting: *Deleted

## 2019-07-11 DIAGNOSIS — D500 Iron deficiency anemia secondary to blood loss (chronic): Secondary | ICD-10-CM

## 2019-07-11 NOTE — Telephone Encounter (Signed)
Pl order cbc/bmp/iron studies/ferritin- and we will advise pt on the next plan.Thx

## 2019-07-11 NOTE — Telephone Encounter (Signed)
Orders entered

## 2019-07-11 NOTE — Telephone Encounter (Signed)
Patient called reporting that he feels he needs to have his labs checked to see if he needs to have an iron infusion because of how he is feeling. Please advise

## 2019-07-12 ENCOUNTER — Other Ambulatory Visit: Payer: Self-pay

## 2019-07-12 ENCOUNTER — Inpatient Hospital Stay: Payer: PPO | Attending: Internal Medicine

## 2019-07-12 DIAGNOSIS — D500 Iron deficiency anemia secondary to blood loss (chronic): Secondary | ICD-10-CM | POA: Insufficient documentation

## 2019-07-12 DIAGNOSIS — Z79899 Other long term (current) drug therapy: Secondary | ICD-10-CM | POA: Diagnosis not present

## 2019-07-12 LAB — BASIC METABOLIC PANEL
Anion gap: 10 (ref 5–15)
BUN: 24 mg/dL — ABNORMAL HIGH (ref 8–23)
CO2: 24 mmol/L (ref 22–32)
Calcium: 8.7 mg/dL — ABNORMAL LOW (ref 8.9–10.3)
Chloride: 103 mmol/L (ref 98–111)
Creatinine, Ser: 1.38 mg/dL — ABNORMAL HIGH (ref 0.61–1.24)
GFR calc Af Amer: 58 mL/min — ABNORMAL LOW (ref >60)
GFR calc non Af Amer: 50 mL/min — ABNORMAL LOW (ref >60)
Glucose, Bld: 239 mg/dL — ABNORMAL HIGH (ref 70–99)
Potassium: 4.8 mmol/L (ref 3.5–5.1)
Sodium: 137 mmol/L (ref 135–145)

## 2019-07-12 LAB — CBC WITH DIFFERENTIAL/PLATELET
Abs Immature Granulocytes: 0 10*3/uL (ref 0.00–0.07)
Basophils Absolute: 0 10*3/uL (ref 0.0–0.1)
Basophils Relative: 1 %
Eosinophils Absolute: 0.1 10*3/uL (ref 0.0–0.5)
Eosinophils Relative: 3 %
HCT: 31.2 % — ABNORMAL LOW (ref 39.0–52.0)
Hemoglobin: 9.6 g/dL — ABNORMAL LOW (ref 13.0–17.0)
Immature Granulocytes: 0 %
Lymphocytes Relative: 27 %
Lymphs Abs: 0.8 10*3/uL (ref 0.7–4.0)
MCH: 31.5 pg (ref 26.0–34.0)
MCHC: 30.8 g/dL (ref 30.0–36.0)
MCV: 102.3 fL — ABNORMAL HIGH (ref 80.0–100.0)
Monocytes Absolute: 0.4 10*3/uL (ref 0.1–1.0)
Monocytes Relative: 14 %
Neutro Abs: 1.6 10*3/uL — ABNORMAL LOW (ref 1.7–7.7)
Neutrophils Relative %: 55 %
Platelets: 171 10*3/uL (ref 150–400)
RBC: 3.05 MIL/uL — ABNORMAL LOW (ref 4.22–5.81)
RDW: 13.7 % (ref 11.5–15.5)
WBC: 2.8 10*3/uL — ABNORMAL LOW (ref 4.0–10.5)
nRBC: 0 % (ref 0.0–0.2)

## 2019-07-12 LAB — IRON AND TIBC
Iron: 143 ug/dL (ref 45–182)
Saturation Ratios: 42 % — ABNORMAL HIGH (ref 17.9–39.5)
TIBC: 337 ug/dL (ref 250–450)
UIBC: 194 ug/dL

## 2019-07-12 LAB — FERRITIN: Ferritin: 15 ng/mL — ABNORMAL LOW (ref 24–336)

## 2019-07-13 ENCOUNTER — Other Ambulatory Visit: Payer: Self-pay | Admitting: Internal Medicine

## 2019-07-13 ENCOUNTER — Other Ambulatory Visit: Payer: Self-pay | Admitting: *Deleted

## 2019-07-13 DIAGNOSIS — D500 Iron deficiency anemia secondary to blood loss (chronic): Secondary | ICD-10-CM

## 2019-07-13 NOTE — Progress Notes (Signed)
Please schedule:  IV ferrahem infusion- in 1 week;  # in 3 weeks- MD; lab-cbc; possible ferrahem.  # keep appts in April as planned.

## 2019-07-14 ENCOUNTER — Ambulatory Visit: Payer: PPO

## 2019-07-16 ENCOUNTER — Ambulatory Visit: Payer: PPO | Attending: Internal Medicine

## 2019-07-16 DIAGNOSIS — Z23 Encounter for immunization: Secondary | ICD-10-CM | POA: Insufficient documentation

## 2019-07-16 NOTE — Progress Notes (Signed)
   Covid-19 Vaccination Clinic  Name:  Seth Hardy    MRN: [id number] DOB: 1946-04-16  07/16/2019  [name] was observed post Covid-19 immunization for 15 minutes without incidence. He was provided with Vaccine Information Sheet and instruction to access the V-Safe system.   [name] was instructed to call 911 with any severe reactions post vaccine: Marland Kitchen Difficulty breathing  . Swelling of your face and throat  . A fast heartbeat  . A bad rash all over your body  . Dizziness and weakness    Immunizations Administered    Name Date Dose VIS Date Route   Pfizer COVID-19 Vaccine 07/16/2019 10:55 AM 0.3 mL 05/05/2019 Intramuscular   Manufacturer: [place]   Lot: [id number]   [place]: [id number]

## 2019-07-19 ENCOUNTER — Other Ambulatory Visit: Payer: Self-pay | Admitting: Cardiovascular Disease

## 2019-07-20 ENCOUNTER — Other Ambulatory Visit: Payer: Self-pay

## 2019-07-20 ENCOUNTER — Inpatient Hospital Stay: Payer: PPO

## 2019-07-20 VITALS — BP 140/51 | HR 56 | Resp 20

## 2019-07-20 DIAGNOSIS — D500 Iron deficiency anemia secondary to blood loss (chronic): Secondary | ICD-10-CM | POA: Diagnosis not present

## 2019-07-20 MED ORDER — SODIUM CHLORIDE 0.9 % IV SOLN
Freq: Once | INTRAVENOUS | Status: AC
  Filled 2019-07-20: qty 250

## 2019-07-20 MED ORDER — SODIUM CHLORIDE 0.9 % IV SOLN
510.0000 mg | Freq: Once | INTRAVENOUS | Status: AC
  Administered 2019-07-20: 510 mg via INTRAVENOUS
  Filled 2019-07-20: qty 510

## 2019-07-26 ENCOUNTER — Other Ambulatory Visit: Payer: Self-pay

## 2019-07-26 ENCOUNTER — Ambulatory Visit (INDEPENDENT_AMBULATORY_CARE_PROVIDER_SITE_OTHER): Payer: PPO

## 2019-07-26 DIAGNOSIS — I350 Nonrheumatic aortic (valve) stenosis: Secondary | ICD-10-CM

## 2019-07-26 DIAGNOSIS — Z7901 Long term (current) use of anticoagulants: Secondary | ICD-10-CM | POA: Diagnosis not present

## 2019-07-26 DIAGNOSIS — I4891 Unspecified atrial fibrillation: Secondary | ICD-10-CM | POA: Diagnosis not present

## 2019-07-26 LAB — POCT INR: INR: 2.7 (ref 2.0–3.0)

## 2019-07-26 MED ORDER — PERFLUTREN LIPID MICROSPHERE
1.0000 mL | INTRAVENOUS | Status: AC | PRN
  Administered 2019-07-26: 2 mL via INTRAVENOUS

## 2019-07-26 NOTE — Patient Instructions (Signed)
Please continue dosage of 1/2 tablet daily except 1 tablet on MONDAYS & FRIDAYS.  Recheck INR in 6 weeks. 

## 2019-07-28 ENCOUNTER — Other Ambulatory Visit: Payer: Self-pay

## 2019-07-28 DIAGNOSIS — I350 Nonrheumatic aortic (valve) stenosis: Secondary | ICD-10-CM

## 2019-08-04 ENCOUNTER — Inpatient Hospital Stay: Payer: PPO

## 2019-08-04 ENCOUNTER — Inpatient Hospital Stay (HOSPITAL_BASED_OUTPATIENT_CLINIC_OR_DEPARTMENT_OTHER): Payer: PPO | Admitting: Internal Medicine

## 2019-08-04 ENCOUNTER — Other Ambulatory Visit: Payer: Self-pay

## 2019-08-04 ENCOUNTER — Inpatient Hospital Stay: Payer: PPO | Attending: Internal Medicine

## 2019-08-04 VITALS — BP 127/48 | HR 41 | Temp 98.6°F | Resp 18

## 2019-08-04 DIAGNOSIS — I129 Hypertensive chronic kidney disease with stage 1 through stage 4 chronic kidney disease, or unspecified chronic kidney disease: Secondary | ICD-10-CM | POA: Insufficient documentation

## 2019-08-04 DIAGNOSIS — I480 Paroxysmal atrial fibrillation: Secondary | ICD-10-CM | POA: Insufficient documentation

## 2019-08-04 DIAGNOSIS — Z85828 Personal history of other malignant neoplasm of skin: Secondary | ICD-10-CM | POA: Insufficient documentation

## 2019-08-04 DIAGNOSIS — D500 Iron deficiency anemia secondary to blood loss (chronic): Secondary | ICD-10-CM

## 2019-08-04 DIAGNOSIS — Z8711 Personal history of peptic ulcer disease: Secondary | ICD-10-CM | POA: Insufficient documentation

## 2019-08-04 DIAGNOSIS — E669 Obesity, unspecified: Secondary | ICD-10-CM | POA: Diagnosis not present

## 2019-08-04 DIAGNOSIS — Z801 Family history of malignant neoplasm of trachea, bronchus and lung: Secondary | ICD-10-CM | POA: Insufficient documentation

## 2019-08-04 DIAGNOSIS — E1122 Type 2 diabetes mellitus with diabetic chronic kidney disease: Secondary | ICD-10-CM | POA: Insufficient documentation

## 2019-08-04 DIAGNOSIS — I4891 Unspecified atrial fibrillation: Secondary | ICD-10-CM | POA: Diagnosis not present

## 2019-08-04 DIAGNOSIS — D509 Iron deficiency anemia, unspecified: Secondary | ICD-10-CM | POA: Insufficient documentation

## 2019-08-04 DIAGNOSIS — Z7982 Long term (current) use of aspirin: Secondary | ICD-10-CM | POA: Insufficient documentation

## 2019-08-04 DIAGNOSIS — I2510 Atherosclerotic heart disease of native coronary artery without angina pectoris: Secondary | ICD-10-CM | POA: Diagnosis not present

## 2019-08-04 DIAGNOSIS — Z8673 Personal history of transient ischemic attack (TIA), and cerebral infarction without residual deficits: Secondary | ICD-10-CM | POA: Insufficient documentation

## 2019-08-04 DIAGNOSIS — Z79899 Other long term (current) drug therapy: Secondary | ICD-10-CM | POA: Insufficient documentation

## 2019-08-04 DIAGNOSIS — Z86010 Personal history of colonic polyps: Secondary | ICD-10-CM | POA: Insufficient documentation

## 2019-08-04 DIAGNOSIS — I350 Nonrheumatic aortic (valve) stenosis: Secondary | ICD-10-CM | POA: Diagnosis not present

## 2019-08-04 DIAGNOSIS — N189 Chronic kidney disease, unspecified: Secondary | ICD-10-CM | POA: Diagnosis not present

## 2019-08-04 DIAGNOSIS — E785 Hyperlipidemia, unspecified: Secondary | ICD-10-CM | POA: Diagnosis not present

## 2019-08-04 DIAGNOSIS — I252 Old myocardial infarction: Secondary | ICD-10-CM | POA: Diagnosis not present

## 2019-08-04 DIAGNOSIS — Z800 Family history of malignant neoplasm of digestive organs: Secondary | ICD-10-CM | POA: Diagnosis not present

## 2019-08-04 DIAGNOSIS — Z794 Long term (current) use of insulin: Secondary | ICD-10-CM | POA: Insufficient documentation

## 2019-08-04 DIAGNOSIS — D72819 Decreased white blood cell count, unspecified: Secondary | ICD-10-CM | POA: Diagnosis not present

## 2019-08-04 LAB — CBC WITH DIFFERENTIAL/PLATELET
Abs Immature Granulocytes: 0.01 10*3/uL (ref 0.00–0.07)
Basophils Absolute: 0 10*3/uL (ref 0.0–0.1)
Basophils Relative: 0 %
Eosinophils Absolute: 0.1 10*3/uL (ref 0.0–0.5)
Eosinophils Relative: 2 %
HCT: 32 % — ABNORMAL LOW (ref 39.0–52.0)
Hemoglobin: 10.2 g/dL — ABNORMAL LOW (ref 13.0–17.0)
Immature Granulocytes: 0 %
Lymphocytes Relative: 29 %
Lymphs Abs: 0.9 10*3/uL (ref 0.7–4.0)
MCH: 32.2 pg (ref 26.0–34.0)
MCHC: 31.9 g/dL (ref 30.0–36.0)
MCV: 100.9 fL — ABNORMAL HIGH (ref 80.0–100.0)
Monocytes Absolute: 0.4 10*3/uL (ref 0.1–1.0)
Monocytes Relative: 14 %
Neutro Abs: 1.7 10*3/uL (ref 1.7–7.7)
Neutrophils Relative %: 55 %
Platelets: 162 10*3/uL (ref 150–400)
RBC: 3.17 MIL/uL — ABNORMAL LOW (ref 4.22–5.81)
RDW: 13.7 % (ref 11.5–15.5)
WBC: 3.1 10*3/uL — ABNORMAL LOW (ref 4.0–10.5)
nRBC: 0 % (ref 0.0–0.2)

## 2019-08-04 MED ORDER — SODIUM CHLORIDE 0.9 % IV SOLN
510.0000 mg | Freq: Once | INTRAVENOUS | Status: AC
  Administered 2019-08-04: 510 mg via INTRAVENOUS
  Filled 2019-08-04: qty 510

## 2019-08-04 MED ORDER — SODIUM CHLORIDE 0.9 % IV SOLN
Freq: Once | INTRAVENOUS | Status: AC
  Filled 2019-08-04: qty 250

## 2019-08-04 NOTE — Progress Notes (Signed)
[facility] OFFICE PROGRESS NOTE  Patient Care Team: Rusty Aus, MD as PCP - General (Internal Medicine)   SUMMARY OF ONCOLOGIC HISTORY:  # MARCH 2017- April 2017- IRON DEFICIENCY ANEMIA ? Etiology s/p IV iron [EGD- ?June 2017- Bleeding gastric "polyp"/ colo- Dr.Gessner]; recommend CAPSULE STUDY [on HOLD sec to ?plavix]  # hx of A.fib- on coumadin-HOLD April 2017/ CAD [Dr.[name]]; Hx of gastric ulcer [Dr.Gessner];   TIA/ CEA [s/p April 2017]; OSA on CPAP.   History of present illness:   [name] patient is here for follow-up of his severe iron deficiency anemia-;also on Coumadin for A. fib is here for follow-up.   In the interim patient needed IV Feraheme-post infusion energy levels improved.  Patient denies any blood in stools.  He continues to be on Plavix-has no capsule study yet.  Review of Systems  Constitutional: Negative for chills, diaphoresis and fever.  HENT: Negative for nosebleeds and sore throat.   Eyes: Negative for double vision.  Respiratory: Negative for cough, hemoptysis, sputum production and wheezing.   Cardiovascular: Negative for chest pain, palpitations, orthopnea and leg swelling.  Gastrointestinal: Negative for abdominal pain, blood in stool, constipation, diarrhea, heartburn, melena, nausea and vomiting.  Genitourinary: Negative for dysuria, frequency and urgency.  Musculoskeletal: Negative for back pain and joint pain.  Skin: Negative.  Negative for itching and rash.  Neurological: Negative for dizziness, tingling, focal weakness, weakness and headaches.  Endo/Heme/Allergies: Does not bruise/bleed easily.  Psychiatric/Behavioral: Negative for depression. The patient is not nervous/anxious and does not have insomnia.     Past Medical History:  Diagnosis Date  . Anemia 08/2015   received 2 units rbc one week ago, 3 IV iron infusion -last 1 week.  . Basal cell carcinoma of skin 2012   removed several spots from arms  and back  . CAD (coronary artery disease)    a. 01/2010 CABG x 4: LIMA->LAD, VG->D1, VG->OM1, VG->PDA. b. NSTEMI 02/2012 in setting of AF-RVR with cath s/p BMS to RCA (plan for 1 month, possibly up to 3 months of Plavix)  . Carotid disease, bilateral ([place])    a. 03/2011 U/S: 40-59% bilat Carotid dzs;  b. 04/2015 Carotid U/S: 40-59% bilat ICA stenosis; c. 05/2015 CTA Neck: [id number] RICA, [id number] LICA, mod-marked R vertebral stenosis, mod L vertebral stenosis.  . Chronic kidney disease    small obtusion per pcp. ultrasound done on 08/29/2015  . Diabetes mellitus type II, controlled ([place])    a. Variable CBG 02/2012 - several meds adjusted.  Marland Kitchen Dysrhythmia    intermittent Atrial Fibrillation  . GERD (gastroesophageal reflux disease)   . Heart murmur   . Hyperlipidemia   . Hypertension   . Hypertensive heart disease   . Iron deficiency anemia    "gets infusions q once in awhile" (04/27/2018)  . Macular edema bil   lazer work done previously  . Mild aortic stenosis    a. 03/2011 Echo: EF 55-60%, Mild AS. b. Mild by cath 02/2012; c. 01/2014 Echo: EF 65-70%, Gr 1 DD, mild AS, mildly dil LA.  . Multiple gastric ulcers 2006  . Myocardial infarction ([place]) 02/2012   stents (x1) at time  . Myocardial infarction [facility])    "2nd one was after 02/2012; don't know date" (04/27/2018)  . Obesity   . On home oxygen therapy    "2.5 w/CPAP" (04/27/2018)  . OSA on CPAP    CPAP settings 3 with oxygen 2.5 (04/27/2018)  . PAF (paroxysmal atrial  fibrillation) ([place])    a. Newly dx 02/2012 & initiated on Coumadin (spont converted to NSR).  . Shortness of breath dyspnea   . Transfusion history    transfusions -1 month ago -2 units    Past Surgical History:  Procedure Laterality Date  . ANGIOPLASTY    . BASAL CELL CARCINOMA EXCISION     "shoulder, arm X 2"  . CARDIAC CATHETERIZATION  30-Aug-2012  . CATARACT EXTRACTION W/ INTRAOCULAR LENS  IMPLANT, BILATERAL Bilateral August 31, 2014  . COLONOSCOPY WITH PROPOFOL N/A 10/15/2015   Procedure:  COLONOSCOPY WITH PROPOFOL;  Surgeon: Gatha Mayer, MD;  Location: [facility] END[facility];  Service: Endoscopy;  Laterality: N/A;  . CORONARY ANGIOPLASTY     Dr. Fletcher Anon  . CORONARY ARTERY BYPASS GRAFT  08/30/09   CABG X3-4; [place], [place], [place],   . CORONARY ATHERECTOMY  04/27/2018  . CORONARY ATHERECTOMY N/A 04/27/2018   Procedure: CORONARY ATHERECTOMY;  Surgeon: Belva Crome, MD;  Location: [facility];  Service: Cardiovascular;  Laterality: N/A;  . cyst L kidney  [place]  . ENDARTERECTOMY Left 08/30/2015   Procedure: ENDARTERECTOMY CAROTID;  Surgeon: Katha Cabal, MD;  Location: [facility] ORS;  Service: Vascular;  Laterality: Left;  . ESOPHAGOGASTRODUODENOSCOPY (EGD) WITH PROPOFOL N/A 10/15/2015   Procedure: ESOPHAGOGASTRODUODENOSCOPY (EGD) WITH PROPOFOL;  Surgeon: Gatha Mayer, MD;  Location: [facility];  Service: Endoscopy;  Laterality: N/A;  . EYE SURGERY Bilateral    "has macular edema cauterized" (04/27/2018)  . INGUINAL HERNIA REPAIR Right   . LEFT HEART CATHETERIZATION WITH CORONARY ANGIOGRAM N/A 03/09/2012   Procedure: LEFT HEART CATHETERIZATION WITH CORONARY ANGIOGRAM;  Surgeon: Wellington Hampshire, MD;  Location: [facility] LAB;  Service: Cardiovascular;  Laterality: N/A;  . PERCUTANEOUS CORONARY STENT INTERVENTION (PCI-S)  03/09/2012   Procedure: PERCUTANEOUS CORONARY STENT INTERVENTION (PCI-S);  Surgeon: Wellington Hampshire, MD;  Location: [facility] CATH LAB;  Service: Cardiovascular;;  . RIGHT/LEFT HEART CATH AND CORONARY ANGIOGRAPHY N/A 04/25/2018   Procedure: RIGHT/LEFT HEART CATH AND CORONARY ANGIOGRAPHY;  Surgeon: Wellington Hampshire, MD;  Location: [facility] LAB;  Service: Cardiovascular;  Laterality: N/A;    Family History  Problem Relation Age of Onset  . COPD Mother        alive 67  . Bladder Cancer Mother   . Heart attack Father        2013-08-30 deceased  . Lung cancer Maternal Aunt   . Liver cancer Maternal Aunt   . Kidney disease Paternal Uncle   . Cancer Other         all paternal aunts and uncles  . Prostate cancer Neg Hx   . Kidney cancer Neg Hx     SOCIAL HISTORY:   Social History  Substance Use Topics  . Smoking status: Never Smoker   . Smokeless tobacco: Never Used     Comment: tobacco use - no  . Alcohol Use: Yes     Comment: rare drink    ALLERGIES:  has No Known Allergies.  MEDICATIONS:  Current Outpatient Prescriptions  Medication Sig Dispense Refill  . acetaminophen (TYLENOL) 325 MG tablet Take 650 mg by mouth every 6 (six) hours as needed. For pain    . amiodarone (PACERONE) 200 MG tablet Take 0.5 tablets (100 mg total) by mouth daily. (Patient taking differently: Take 100 mg by mouth at bedtime. ) 45 tablet 3  . amLODipine (NORVASC) 5 MG tablet Take one tablet by mouth one time daily 90 tablet  3  . aspirin 81 MG EC tablet Take 81 mg by mouth daily.      Marland Kitchen atorvastatin (LIPITOR) 20 MG tablet Take 1 tablet (20 mg total) by mouth at bedtime. 90 tablet 3  . furosemide (LASIX) 20 MG tablet Take 1 tablet (20 mg total) by mouth as needed. 30 tablet 6  . glimepiride (AMARYL) 4 MG tablet Take 4 mg by mouth 2 (two) times daily.      . insulin glargine (LANTUS) 100 UNIT/ML injection Inject 20 Units into the skin daily.     . insulin lispro (HUMALOG) 100 UNIT/ML injection Inject 8 Units into the skin 2 (two) times daily with a meal. (Patient taking differently: Inject into the skin 3 (three) times daily. Per sliding scale.)    . metFORMIN (GLUCOPHAGE) 500 MG tablet Take 500 mg by mouth 2 (two) times daily with a meal.    . nitroGLYCERIN (NITROSTAT) 0.4 MG SL tablet Place 1 tablet (0.4 mg total) under the tongue every 5 (five) minutes as needed (up to 3 doses). 25 tablet 1  . OXYGEN Inhale 2.5 L/min into the lungs at bedtime. With CPAP    . pantoprazole (PROTONIX) 40 MG tablet Take 1 tablet (40 mg total) by mouth daily. (Patient taking differently: Take 40 mg by mouth 2 (two) times daily. ) 90 tablet 3  . ramipril (ALTACE) 10 MG capsule Take  1 capsule (10 mg total) by mouth daily. 90 capsule 5  . warfarin (COUMADIN) 5 MG tablet Take as directed by anticoagulation clinic 90 tablet 1  . IRON-VITAMIN C PO Take 1 tablet by mouth daily. Reported on 08/27/2015    . Omega-3 Fatty Acids (FISH OIL) 1200 MG CAPS Take 1 capsule by mouth daily. Reported on 08/27/2015     No current facility-administered medications for this visit.    PHYSICAL EXAMINATION:   BP 158/71 mmHg  Pulse 51  Temp(Src) 96.1 F (35.6 C)  Resp 18  Wt 192 lb 0.3 oz (87.1 kg)  Filed Weights   08/27/15 1015  Weight: 192 lb 0.3 oz (87.1 kg)    Physical Exam  Constitutional: He is oriented to person, place, and time and well-developed, well-nourished, and in no distress.  Accompanied by his wife.  HENT:  Head: Normocephalic and atraumatic.  Mouth/Throat: Oropharynx is clear and moist. No oropharyngeal exudate.  Eyes: Pupils are equal, round, and reactive to light.  Cardiovascular: Normal rate and regular rhythm.  Pulmonary/Chest: No respiratory distress. He has no wheezes.  Abdominal: Soft. Bowel sounds are normal. He exhibits no distension and no mass. There is no abdominal tenderness. There is no rebound and no guarding.  Musculoskeletal:        General: No tenderness or edema. Normal range of motion.     Cervical back: Normal range of motion and neck supple.  Neurological: He is alert and oriented to person, place, and time.  Skin: Skin is warm.  Psychiatric: Affect normal.     LABORATORY DATA:  I have reviewed the data as listed    Component Value Date/Time   NA 139 07/25/2015 1138   NA 139 01/22/2013 0348   K 4.1 07/25/2015 1138   K 4.2 01/22/2013 0348   CL 103 07/25/2015 1138   CL 106 01/22/2013 0348   CO2 27 07/25/2015 1138   CO2 28 01/22/2013 0348   GLUCOSE 216* 07/25/2015 1138   GLUCOSE 158* 01/22/2013 0348   BUN 14 07/25/2015 1138   BUN 16 01/22/2013 0348  CREATININE 1.03 07/25/2015 1138   CREATININE 1.02 01/22/2013 0348   CALCIUM  9.1 07/25/2015 1138   CALCIUM 8.6 01/22/2013 0348   PROT 7.2 06/25/2015 1045   PROT 7.5 01/21/2013 0209   ALBUMIN 4.1 06/25/2015 1045   ALBUMIN 4.3 01/21/2013 0209   AST 30 06/25/2015 1045   AST 28 01/21/2013 0209   ALT 33 06/25/2015 1045   ALT 36 01/21/2013 0209   ALKPHOS 104 06/25/2015 1045   ALKPHOS 143* 01/21/2013 0209   BILITOT 0.4 06/25/2015 1045   BILITOT 0.4 01/21/2013 0209   GFRNONAA >60 07/25/2015 1138   GFRNONAA >60 01/22/2013 0348   GFRAA >60 07/25/2015 1138   GFRAA >60 01/22/2013 0348    No results found for: SPEP, UPEP  Lab Results  Component Value Date   WBC 4.5 08/22/2015   NEUTROABS 2.5 08/22/2015   HGB 8.0 Repeated and verified X2.* 08/22/2015   HCT 26.2 Repeated and verified X2.* 08/22/2015   MCV 67.4 Repeated and verified X2.* 08/22/2015   PLT 264.0 08/22/2015      Chemistry      Component Value Date/Time   NA 139 07/25/2015 1138   NA 139 01/22/2013 0348   K 4.1 07/25/2015 1138   K 4.2 01/22/2013 0348   CL 103 07/25/2015 1138   CL 106 01/22/2013 0348   CO2 27 07/25/2015 1138   CO2 28 01/22/2013 0348   BUN 14 07/25/2015 1138   BUN 16 01/22/2013 0348   CREATININE 1.03 07/25/2015 1138   CREATININE 1.02 01/22/2013 0348      Component Value Date/Time   CALCIUM 9.1 07/25/2015 1138   CALCIUM 8.6 01/22/2013 0348   ALKPHOS 104 06/25/2015 1045   ALKPHOS 143* 01/21/2013 0209   AST 30 06/25/2015 1045   AST 28 01/21/2013 0209   ALT 33 06/25/2015 1045   ALT 36 01/21/2013 0209   BILITOT 0.4 06/25/2015 1045   BILITOT 0.4 01/21/2013 0209        ASSESSMENT & PLAN:   Iron deficiency anemia due to chronic blood loss #Anemia iron deficient-likely secondary to gastric losses versus others.   #Hemoglobin-10.9; Proceed with IV ferrheam today.   Awaiting capsule study with Dr.Mann [will need to be off plavix].    # Leucopenia 3.0/ neutropenia 1.9/thrombocytopenia-150s.  Intermittent.  STABLE>   # CAD s/p stenting- on asprin+ plavix x 6 months [in  June]; A.fib- on Coumadin. Awaiting evaluation next month. .  # DISPOSITION:  #  IV ferrahem today; # follow up as planned in April MD;labs- cbc; possible IV ferrahem- Dr.B

## 2019-08-04 NOTE — Assessment & Plan Note (Signed)
#  Anemia iron deficient-likely secondary to gastric losses versus others.   #Hemoglobin-10.9; Proceed with IV ferrheam today.   Awaiting capsule study with Dr.Mann [will need to be off plavix].    # Leucopenia 3.0/ neutropenia 1.9/thrombocytopenia-150s.  Intermittent.  STABLE>   # CAD s/p stenting- on asprin+ plavix x 6 months [in June]; A.fib- on Coumadin. Awaiting evaluation next month. .  # DISPOSITION:  #  IV ferrahem today; # follow up as planned in April MD;labs- cbc; possible IV ferrahem- Dr.B

## 2019-08-04 NOTE — Progress Notes (Signed)
Pt tolerated infusion well. No s/s of distress noted. Pt and VS stable at discharge.  

## 2019-08-15 DIAGNOSIS — E113593 Type 2 diabetes mellitus with proliferative diabetic retinopathy without macular edema, bilateral: Secondary | ICD-10-CM | POA: Diagnosis not present

## 2019-08-23 DIAGNOSIS — Z9989 Dependence on other enabling machines and devices: Secondary | ICD-10-CM | POA: Diagnosis not present

## 2019-08-23 DIAGNOSIS — D500 Iron deficiency anemia secondary to blood loss (chronic): Secondary | ICD-10-CM | POA: Diagnosis not present

## 2019-08-23 DIAGNOSIS — G4733 Obstructive sleep apnea (adult) (pediatric): Secondary | ICD-10-CM | POA: Diagnosis not present

## 2019-08-23 DIAGNOSIS — E1165 Type 2 diabetes mellitus with hyperglycemia: Secondary | ICD-10-CM | POA: Diagnosis not present

## 2019-08-23 DIAGNOSIS — E1139 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: Secondary | ICD-10-CM | POA: Diagnosis not present

## 2019-09-06 ENCOUNTER — Ambulatory Visit (INDEPENDENT_AMBULATORY_CARE_PROVIDER_SITE_OTHER): Payer: PPO

## 2019-09-06 ENCOUNTER — Inpatient Hospital Stay: Payer: PPO | Attending: Internal Medicine

## 2019-09-06 ENCOUNTER — Other Ambulatory Visit: Payer: Self-pay

## 2019-09-06 ENCOUNTER — Inpatient Hospital Stay: Payer: PPO

## 2019-09-06 ENCOUNTER — Inpatient Hospital Stay (HOSPITAL_BASED_OUTPATIENT_CLINIC_OR_DEPARTMENT_OTHER): Payer: PPO | Admitting: Internal Medicine

## 2019-09-06 VITALS — BP 145/48 | HR 45 | Temp 97.6°F | Resp 18 | Ht 68.0 in | Wt 183.0 lb

## 2019-09-06 VITALS — BP 138/43 | HR 42 | Temp 98.2°F | Resp 18

## 2019-09-06 DIAGNOSIS — E119 Type 2 diabetes mellitus without complications: Secondary | ICD-10-CM | POA: Insufficient documentation

## 2019-09-06 DIAGNOSIS — Z801 Family history of malignant neoplasm of trachea, bronchus and lung: Secondary | ICD-10-CM | POA: Insufficient documentation

## 2019-09-06 DIAGNOSIS — I2510 Atherosclerotic heart disease of native coronary artery without angina pectoris: Secondary | ICD-10-CM | POA: Insufficient documentation

## 2019-09-06 DIAGNOSIS — Z79899 Other long term (current) drug therapy: Secondary | ICD-10-CM | POA: Insufficient documentation

## 2019-09-06 DIAGNOSIS — D72819 Decreased white blood cell count, unspecified: Secondary | ICD-10-CM | POA: Diagnosis not present

## 2019-09-06 DIAGNOSIS — Z800 Family history of malignant neoplasm of digestive organs: Secondary | ICD-10-CM | POA: Insufficient documentation

## 2019-09-06 DIAGNOSIS — Z7901 Long term (current) use of anticoagulants: Secondary | ICD-10-CM | POA: Diagnosis not present

## 2019-09-06 DIAGNOSIS — E669 Obesity, unspecified: Secondary | ICD-10-CM | POA: Diagnosis not present

## 2019-09-06 DIAGNOSIS — R5383 Other fatigue: Secondary | ICD-10-CM | POA: Diagnosis not present

## 2019-09-06 DIAGNOSIS — Z7982 Long term (current) use of aspirin: Secondary | ICD-10-CM | POA: Diagnosis not present

## 2019-09-06 DIAGNOSIS — G4730 Sleep apnea, unspecified: Secondary | ICD-10-CM | POA: Diagnosis not present

## 2019-09-06 DIAGNOSIS — I129 Hypertensive chronic kidney disease with stage 1 through stage 4 chronic kidney disease, or unspecified chronic kidney disease: Secondary | ICD-10-CM | POA: Diagnosis not present

## 2019-09-06 DIAGNOSIS — Z85828 Personal history of other malignant neoplasm of skin: Secondary | ICD-10-CM | POA: Insufficient documentation

## 2019-09-06 DIAGNOSIS — I252 Old myocardial infarction: Secondary | ICD-10-CM | POA: Insufficient documentation

## 2019-09-06 DIAGNOSIS — I4891 Unspecified atrial fibrillation: Secondary | ICD-10-CM | POA: Insufficient documentation

## 2019-09-06 DIAGNOSIS — K7460 Unspecified cirrhosis of liver: Secondary | ICD-10-CM | POA: Diagnosis not present

## 2019-09-06 DIAGNOSIS — Z8673 Personal history of transient ischemic attack (TIA), and cerebral infarction without residual deficits: Secondary | ICD-10-CM | POA: Insufficient documentation

## 2019-09-06 DIAGNOSIS — Z794 Long term (current) use of insulin: Secondary | ICD-10-CM | POA: Insufficient documentation

## 2019-09-06 DIAGNOSIS — E785 Hyperlipidemia, unspecified: Secondary | ICD-10-CM | POA: Insufficient documentation

## 2019-09-06 DIAGNOSIS — K219 Gastro-esophageal reflux disease without esophagitis: Secondary | ICD-10-CM | POA: Diagnosis not present

## 2019-09-06 DIAGNOSIS — D500 Iron deficiency anemia secondary to blood loss (chronic): Secondary | ICD-10-CM

## 2019-09-06 DIAGNOSIS — D696 Thrombocytopenia, unspecified: Secondary | ICD-10-CM | POA: Diagnosis not present

## 2019-09-06 DIAGNOSIS — D539 Nutritional anemia, unspecified: Secondary | ICD-10-CM | POA: Diagnosis not present

## 2019-09-06 LAB — POCT INR: INR: 2.5 (ref 2.0–3.0)

## 2019-09-06 LAB — CBC WITH DIFFERENTIAL/PLATELET
Abs Immature Granulocytes: 0.01 10*3/uL (ref 0.00–0.07)
Basophils Absolute: 0 10*3/uL (ref 0.0–0.1)
Basophils Relative: 1 %
Eosinophils Absolute: 0.1 10*3/uL (ref 0.0–0.5)
Eosinophils Relative: 4 %
HCT: 33.2 % — ABNORMAL LOW (ref 39.0–52.0)
Hemoglobin: 10.7 g/dL — ABNORMAL LOW (ref 13.0–17.0)
Immature Granulocytes: 0 %
Lymphocytes Relative: 24 %
Lymphs Abs: 0.7 10*3/uL (ref 0.7–4.0)
MCH: 32.6 pg (ref 26.0–34.0)
MCHC: 32.2 g/dL (ref 30.0–36.0)
MCV: 101.2 fL — ABNORMAL HIGH (ref 80.0–100.0)
Monocytes Absolute: 0.4 10*3/uL (ref 0.1–1.0)
Monocytes Relative: 13 %
Neutro Abs: 1.6 10*3/uL — ABNORMAL LOW (ref 1.7–7.7)
Neutrophils Relative %: 58 %
Platelets: 148 10*3/uL — ABNORMAL LOW (ref 150–400)
RBC: 3.28 MIL/uL — ABNORMAL LOW (ref 4.22–5.81)
RDW: 13.6 % (ref 11.5–15.5)
WBC: 2.7 10*3/uL — ABNORMAL LOW (ref 4.0–10.5)
nRBC: 0 % (ref 0.0–0.2)

## 2019-09-06 MED ORDER — SODIUM CHLORIDE 0.9 % IV SOLN
510.0000 mg | Freq: Once | INTRAVENOUS | Status: AC
  Administered 2019-09-06: 510 mg via INTRAVENOUS
  Filled 2019-09-06: qty 17

## 2019-09-06 MED ORDER — SODIUM CHLORIDE 0.9 % IV SOLN
Freq: Once | INTRAVENOUS | Status: AC
  Filled 2019-09-06: qty 250

## 2019-09-06 NOTE — Progress Notes (Signed)
[facility] OFFICE PROGRESS NOTE  Patient Care Team: Seth Aus, Hardy as PCP - General (Internal Medicine)   SUMMARY OF ONCOLOGIC HISTORY:  # MARCH 2017- April 2017- IRON DEFICIENCY ANEMIA ? Etiology s/p IV iron [EGD- ?June 2017- Bleeding gastric "polyp"/ colo- Seth Hardy]; recommend CAPSULE STUDY [on HOLD sec to ?plavix]  # hx of A.fib- on coumadin-HOLD April 2017/ CAD [Seth Hardy]; Hx of gastric ulcer [Seth Hardy];   TIA/ CEA [s/p April 2017]; OSA on CPAP.   History of present illness:   [name] patient is here for follow-up of his severe iron deficiency anemia-;also on Coumadin for A. fib is here for follow-up.  Patient states that his energy levels in general improved post IV iron infusion.  However the last time around-energy levels did not improve.  Mild to moderate fatigue.  Denies any blood in stools or black or stools.  He continues been Plavix-no capsule study yet.  Review of Systems  Constitutional: Positive for malaise/fatigue. Negative for chills, diaphoresis and fever.  HENT: Negative for nosebleeds and sore throat.   Eyes: Negative for double vision.  Respiratory: Negative for cough, hemoptysis, sputum production and wheezing.   Cardiovascular: Negative for chest pain, palpitations, orthopnea and leg swelling.  Gastrointestinal: Negative for abdominal pain, blood in stool, constipation, diarrhea, heartburn, melena, nausea and vomiting.  Genitourinary: Negative for dysuria, frequency and urgency.  Musculoskeletal: Negative for back pain and joint pain.  Skin: Negative.  Negative for itching and rash.  Neurological: Negative for dizziness, tingling, focal weakness, weakness and headaches.  Endo/Heme/Allergies: Does not bruise/bleed easily.  Psychiatric/Behavioral: Negative for depression. The patient is not nervous/anxious and does not have insomnia.     Past Medical History:  Diagnosis Date  . Anemia 08/2015   received 2 units rbc  one week ago, 3 IV iron infusion -last 1 week.  . Basal cell carcinoma of skin 2012   removed several spots from arms and back  . CAD (coronary artery disease)    a. 01/2010 CABG x 4: LIMA->LAD, VG->D1, VG->OM1, VG->PDA. b. NSTEMI 02/2012 in setting of AF-RVR with cath s/p BMS to RCA (plan for 1 month, possibly up to 3 months of Plavix)  . Carotid disease, bilateral (Seth Hardy)    a. 03/2011 U/S: 40-59% bilat Carotid dzs;  b. 04/2015 Carotid U/S: 40-59% bilat ICA stenosis; c. 05/2015 CTA Neck: 26% RICA, 71% LICA, mod-marked R vertebral stenosis, mod L vertebral stenosis.  . Chronic kidney disease    small obtusion per pcp. ultrasound done on 08/29/2015  . Diabetes mellitus type II, controlled ([place])    a. Variable CBG 02/2012 - several meds adjusted.  Marland Kitchen Dysrhythmia    intermittent Atrial Fibrillation  . GERD (gastroesophageal reflux disease)   . Heart murmur   . Hyperlipidemia   . Hypertension   . Hypertensive heart disease   . Iron deficiency anemia    "gets infusions q once in awhile" (04/27/2018)  . Macular edema bil   lazer work done previously  . Mild aortic stenosis    a. 03/2011 Echo: EF 55-60%, Mild AS. b. Mild by cath 02/2012; c. 01/2014 Echo: EF 65-70%, Gr 1 DD, mild AS, mildly dil LA.  . Multiple gastric ulcers 2006  . Myocardial infarction (Seth Hardy) 02/2012   stents (x1) at time  . Myocardial infarction Seth Hardy)    "2nd one was after 02/2012; don't know date" (04/27/2018)  . Obesity   . On home oxygen therapy    "2.5 w/CPAP" (04/27/2018)  .  OSA on CPAP    CPAP settings 3 with oxygen 2.5 (04/27/2018)  . PAF (paroxysmal atrial fibrillation) (Seth Hardy)    a. Newly dx 02/2012 & initiated on Coumadin (spont converted to NSR).  . Shortness of breath dyspnea   . Transfusion history    transfusions -1 month ago -2 units    Past Surgical History:  Procedure Laterality Date  . ANGIOPLASTY    . BASAL CELL CARCINOMA EXCISION     "shoulder, arm X 2"  . CARDIAC CATHETERIZATION  Jul 05, 2012  . CATARACT  EXTRACTION W/ INTRAOCULAR LENS  IMPLANT, BILATERAL Bilateral 07-05-14  . COLONOSCOPY WITH PROPOFOL N/A 10/15/2015   Procedure: COLONOSCOPY WITH PROPOFOL;  Surgeon: Seth Hardy;  Location: Seth [facility];  Service: Hardy;  Laterality: N/A;  . CORONARY ANGIOPLASTY     Dr. Fletcher Hardy  . CORONARY ARTERY BYPASS GRAFT  05-Jul-2009   CABG X3-4; Seth Hardy, Seth Hardy, Seth Hardy,   . CORONARY ATHERECTOMY  04/27/2018  . CORONARY ATHERECTOMY N/A 04/27/2018   Procedure: CORONARY ATHERECTOMY;  Surgeon: Seth Crome, Hardy;  Location: Seth [facility];  Service: Cardiovascular;  Laterality: N/A;  . cyst L kidney  Seth Hardy  . ENDARTERECTOMY Left 08/30/2015   Procedure: ENDARTERECTOMY CAROTID;  Surgeon: Seth Cabal, Hardy;  Location: Seth [facility];  Service: Vascular;  Laterality: Left;  . ESOPHAGOGASTRODUODENOSCOPY (EGD) WITH PROPOFOL N/A 10/15/2015   Procedure: ESOPHAGOGASTRODUODENOSCOPY (EGD) WITH PROPOFOL;  Surgeon: Seth Hardy;  Location: Seth [facility];  Service: Hardy;  Laterality: N/A;  . EYE SURGERY Bilateral    "has macular edema cauterized" (04/27/2018)  . INGUINAL HERNIA REPAIR Right   . LEFT HEART CATHETERIZATION WITH CORONARY ANGIOGRAM N/A 03/09/2012   Procedure: LEFT HEART CATHETERIZATION WITH CORONARY ANGIOGRAM;  Surgeon: Seth Hampshire, Hardy;  Location: [facility];  Service: Cardiovascular;  Laterality: N/A;  . PERCUTANEOUS CORONARY STENT INTERVENTION (PCI-S)  03/09/2012   Procedure: PERCUTANEOUS CORONARY STENT INTERVENTION (PCI-S);  Surgeon: Seth Hampshire, Hardy;  Location: [facility] CATH [facility];  Service: Cardiovascular;;  . RIGHT/LEFT HEART CATH AND CORONARY ANGIOGRAPHY N/A 04/25/2018   Procedure: RIGHT/LEFT HEART CATH AND CORONARY ANGIOGRAPHY;  Surgeon: Seth Hampshire, Hardy;  Location: [facility];  Service: Cardiovascular;  Laterality: N/A;    Family History  Problem Relation Age of Onset  . COPD Mother        alive 12  . Bladder Cancer Mother   . Heart attack Father        07-05-13  deceased  . Lung cancer Maternal Aunt   . Liver cancer Maternal Aunt   . Kidney disease Paternal Uncle   . Cancer Other        all paternal aunts and uncles  . Prostate cancer Neg Hx   . Kidney cancer Neg Hx     SOCIAL HISTORY:   Social History  Substance Use Topics  . Smoking status: Never Smoker   . Smokeless tobacco: Never Used     Comment: tobacco use - no  . Alcohol Use: Yes     Comment: rare drink    ALLERGIES:  has No Known Allergies.  MEDICATIONS:  Current Outpatient Prescriptions  Medication Sig Dispense Refill  . acetaminophen (TYLENOL) 325 MG tablet Take 650 mg by mouth every 6 (six) hours as needed. For pain    . amiodarone (PACERONE) 200 MG tablet Take 0.5 tablets (100 mg total) by mouth daily. (Patient taking differently: Take 100 mg by mouth at bedtime. ) 45 tablet  3  . amLODipine (NORVASC) 5 MG tablet Take one tablet by mouth one time daily 90 tablet 3  . aspirin 81 MG EC tablet Take 81 mg by mouth daily.      Marland Kitchen atorvastatin (LIPITOR) 20 MG tablet Take 1 tablet (20 mg total) by mouth at bedtime. 90 tablet 3  . furosemide (LASIX) 20 MG tablet Take 1 tablet (20 mg total) by mouth as needed. 30 tablet 6  . glimepiride (AMARYL) 4 MG tablet Take 4 mg by mouth 2 (two) times daily.      . insulin glargine (LANTUS) 100 UNIT/ML injection Inject 20 Units into the skin daily.     . insulin lispro (HUMALOG) 100 UNIT/ML injection Inject 8 Units into the skin 2 (two) times daily with a meal. (Patient taking differently: Inject into the skin 3 (three) times daily. Per sliding scale.)    . metFORMIN (GLUCOPHAGE) 500 MG tablet Take 500 mg by mouth 2 (two) times daily with a meal.    . nitroGLYCERIN (NITROSTAT) 0.4 MG SL tablet Place 1 tablet (0.4 mg total) under the tongue every 5 (five) minutes as needed (up to 3 doses). 25 tablet 1  . OXYGEN Inhale 2.5 L/min into the lungs at bedtime. With CPAP    . pantoprazole (PROTONIX) 40 MG tablet Take 1 tablet (40 mg total) by mouth daily.  (Patient taking differently: Take 40 mg by mouth 2 (two) times daily. ) 90 tablet 3  . ramipril (ALTACE) 10 MG capsule Take 1 capsule (10 mg total) by mouth daily. 90 capsule 5  . warfarin (COUMADIN) 5 MG tablet Take as directed by anticoagulation clinic 90 tablet 1  . IRON-VITAMIN C PO Take 1 tablet by mouth daily. Reported on 08/27/2015    . Omega-3 Fatty Acids (FISH OIL) 1200 MG CAPS Take 1 capsule by mouth daily. Reported on 08/27/2015     No current facility-administered medications for this visit.    PHYSICAL EXAMINATION:   BP 158/71 mmHg  Pulse 51  Temp(Src) 96.1 F (35.6 C)  Resp 18  Wt 192 lb 0.3 oz (87.1 kg)  Filed Weights   08/27/15 1015  Weight: 192 lb 0.3 oz (87.1 kg)    Physical Exam  Constitutional: He is oriented to person, place, and time and well-developed, well-nourished, and in no distress.  Patient alone.  HENT:  Head: Normocephalic and atraumatic.  Mouth/Throat: Oropharynx is clear and moist. No oropharyngeal exudate.  Eyes: Pupils are equal, round, and reactive to light.  Cardiovascular: Normal rate and regular rhythm.  Pulmonary/Chest: No respiratory distress. He has no wheezes.  Abdominal: Soft. Bowel sounds are normal. He exhibits no distension and no mass. There is no abdominal tenderness. There is no rebound and no guarding.  Musculoskeletal:        General: No tenderness or edema. Normal range of motion.     Cervical back: Normal range of motion and neck supple.  Neurological: He is alert and oriented to person, place, and time.  Skin: Skin is warm.  Psychiatric: Affect normal.     LABORATORY DATA:  I have reviewed the data as listed    Component Value Date/Time   NA 139 07/25/2015 1138   NA 139 01/22/2013 0348   K 4.1 07/25/2015 1138   K 4.2 01/22/2013 0348   CL 103 07/25/2015 1138   CL 106 01/22/2013 0348   CO2 27 07/25/2015 1138   CO2 28 01/22/2013 0348   GLUCOSE 216* 07/25/2015 1138   GLUCOSE 158*  01/22/2013 0348   BUN 14  07/25/2015 1138   BUN 16 01/22/2013 0348   CREATININE 1.03 07/25/2015 1138   CREATININE 1.02 01/22/2013 0348   CALCIUM 9.1 07/25/2015 1138   CALCIUM 8.6 01/22/2013 0348   PROT 7.2 06/25/2015 1045   PROT 7.5 01/21/2013 0209   ALBUMIN 4.1 06/25/2015 1045   ALBUMIN 4.3 01/21/2013 0209   AST 30 06/25/2015 1045   AST 28 01/21/2013 0209   ALT 33 06/25/2015 1045   ALT 36 01/21/2013 0209   ALKPHOS 104 06/25/2015 1045   ALKPHOS 143* 01/21/2013 0209   BILITOT 0.4 06/25/2015 1045   BILITOT 0.4 01/21/2013 0209   GFRNONAA >60 07/25/2015 1138   GFRNONAA >60 01/22/2013 0348   GFRAA >60 07/25/2015 1138   GFRAA >60 01/22/2013 0348    No results found for: SPEP, UPEP  Hardy Results  Component Value Date   WBC 4.5 08/22/2015   NEUTROABS 2.5 08/22/2015   HGB 8.0 Repeated and verified X2.* 08/22/2015   HCT 26.2 Repeated and verified X2.* 08/22/2015   MCV 67.4 Repeated and verified X2.* 08/22/2015   PLT 264.0 08/22/2015      Chemistry      Component Value Date/Time   NA 139 07/25/2015 1138   NA 139 01/22/2013 0348   K 4.1 07/25/2015 1138   K 4.2 01/22/2013 0348   CL 103 07/25/2015 1138   CL 106 01/22/2013 0348   CO2 27 07/25/2015 1138   CO2 28 01/22/2013 0348   BUN 14 07/25/2015 1138   BUN 16 01/22/2013 0348   CREATININE 1.03 07/25/2015 1138   CREATININE 1.02 01/22/2013 0348      Component Value Date/Time   CALCIUM 9.1 07/25/2015 1138   CALCIUM 8.6 01/22/2013 0348   ALKPHOS 104 06/25/2015 1045   ALKPHOS 143* 01/21/2013 0209   AST 30 06/25/2015 1045   AST 28 01/21/2013 0209   ALT 33 06/25/2015 1045   ALT 36 01/21/2013 0209   BILITOT 0.4 06/25/2015 1045   BILITOT 0.4 01/21/2013 0209        ASSESSMENT & PLAN:   Iron deficiency anemia due to chronic blood loss #Macrocytic anemia-multifactorial anemia iron deficient-likely secondary to gastric losses versus others [see below]  #Given patient's lack of complete improvement hemoglobin post IV iron infusion; given  macrocytosis-concerned about any concurrent causes of anemia.  Will check B84 folic acid LDH; haptoglobin reticulocyte count copper zinc.  Discussed that if no obvious reason noted for his lack of improvement of anemia with IV iron-bone marrow biopsy would be recommended.  Discussed the bone marrow biopsy procedure. ? Cirrhosis based on recent CT scan A/P in 2020.   #Hemoglobin-10.7; Proceed with IV ferrheam today.   Awaiting capsule study with Dr.Mann [will need to be off plavix].    # Leucopenia 2.7/ neutropenia 1.6/thrombocytopenia-140-s150s.  Intermittent.? Liver disease- overall  STABLE   # CAD s/p stenting- on asprin+ plavix x 6 months [in June]; A.fib- on Coumadin.  Awaiting evaluation with cardiology.  # DISPOSITION:  #  IV ferrahem today; # follow up as planned in 2 months- Hardy;labs- cbc;b12; LDH; haptoglobin; copper; zinc; retic count;  possible IV ferrahem- Dr.B

## 2019-09-06 NOTE — Assessment & Plan Note (Addendum)
#  Macrocytic anemia-multifactorial anemia iron deficient-likely secondary to gastric losses versus others [see below]  #Given patient's lack of complete improvement hemoglobin post IV iron infusion; given macrocytosis-concerned about any concurrent causes of anemia.  Will check W11 folic acid LDH; haptoglobin reticulocyte count copper zinc.  Discussed that if no obvious reason noted for his lack of improvement of anemia with IV iron-bone marrow biopsy would be recommended.  Discussed the bone marrow biopsy procedure. ? Cirrhosis based on recent CT scan A/P in 2020.   #Hemoglobin-10.7; Proceed with IV ferrheam today.   Awaiting capsule study with Dr.Mann [will need to be off plavix].    # Leucopenia 2.7/ neutropenia 1.6/thrombocytopenia-140-s150s.  Intermittent.? Liver disease- overall  STABLE   # CAD s/p stenting- on asprin+ plavix x 6 months [in June]; A.fib- on Coumadin.  Awaiting evaluation with cardiology.  # DISPOSITION:  #  IV ferrahem today; # follow up as planned in 2 months- MD;labs- cbc;b12; LDH; haptoglobin; copper; zinc; retic count;  possible IV ferrahem- Dr.B

## 2019-09-06 NOTE — Progress Notes (Signed)
Pt tolerated infusion well. Pt and VS stable at discharge.  

## 2019-09-06 NOTE — Patient Instructions (Signed)
Please continue dosage of 1/2 tablet daily except 1 tablet on MONDAYS & FRIDAYS.  Recheck INR in 6 weeks. 

## 2019-09-07 ENCOUNTER — Ambulatory Visit (INDEPENDENT_AMBULATORY_CARE_PROVIDER_SITE_OTHER): Payer: PPO | Admitting: Cardiovascular Disease

## 2019-09-07 ENCOUNTER — Encounter: Payer: Self-pay | Admitting: Cardiovascular Disease

## 2019-09-07 VITALS — BP 142/58 | HR 48 | Ht 68.0 in | Wt 182.8 lb

## 2019-09-07 DIAGNOSIS — R072 Precordial pain: Secondary | ICD-10-CM

## 2019-09-07 DIAGNOSIS — I359 Nonrheumatic aortic valve disorder, unspecified: Secondary | ICD-10-CM

## 2019-09-07 DIAGNOSIS — I779 Disorder of arteries and arterioles, unspecified: Secondary | ICD-10-CM | POA: Diagnosis not present

## 2019-09-07 DIAGNOSIS — I4819 Other persistent atrial fibrillation: Secondary | ICD-10-CM

## 2019-09-07 DIAGNOSIS — I25708 Atherosclerosis of coronary artery bypass graft(s), unspecified, with other forms of angina pectoris: Secondary | ICD-10-CM

## 2019-09-07 NOTE — Patient Instructions (Signed)
Medication Instructions:  Your physician recommends that you continue on your current medications as directed. Please refer to the Current Medication list given to you today.  *If you need a refill on your cardiac medications before your next appointment, please call your pharmacy*   Lab Work: None ordered If you have labs (blood work) drawn today and your tests are completely normal, you will receive your results only by: Marland Kitchen MyChart Message (if you have MyChart) OR . A paper copy in the mail If you have any lab test that is abnormal or we need to change your treatment, we will call you to review the results.   Testing/Procedures: Your physician has requested that you have a lexiscan myoview. For further information please visit [email or web address]. Please follow instruction sheet, as given.     Follow-Up: At [facility], you and your health needs are our priority.  As part of our continuing mission to provide you with exceptional heart care, we have created designated Provider Care Teams.  These Care Teams include your primary Cardiologist (physician) and Advanced Practice Providers (APPs -  Physician Assistants and Nurse Practitioners) who all work together to provide you with the care you need, when you need it.  We recommend signing up for the patient portal called "MyChart".  Sign up information is provided on this After Visit Summary.  MyChart is used to connect with patients for Virtual Visits (Telemedicine).  Patients are able to view lab/test results, encounter notes, upcoming appointments, etc.  Non-urgent messages can be sent to your provider as well.   To learn more about what you can do with MyChart, go to [email or web address].    Your next appointment:   4 month(s)  The format for your next appointment:   In Person  Provider:    You may see Kathlyn Sacramento, MD or one of the following Advanced Practice Providers on your designated Care Team:    Murray Hodgkins,  NP  Christell Faith, PA-C  Marrianne Mood, PA-C    Other Instructions [facility]  Your caregiver has ordered a Stress Test with nuclear imaging. The purpose of this test is to evaluate the blood supply to your heart muscle. This procedure is referred to as a "Non-Invasive Stress Test." This is because other than having an IV started in your vein, nothing is inserted or "invades" your body. Cardiac stress tests are done to find areas of poor blood flow to the heart by determining the extent of coronary artery disease (CAD). Some patients exercise on a treadmill, which naturally increases the blood flow to your heart, while others who are  unable to walk on a treadmill due to physical limitations have a pharmacologic/chemical stress agent called Lexiscan . This medicine will mimic walking on a treadmill by temporarily increasing your coronary blood flow.   Please note: these test may take anywhere between 2-4 hours to complete  PLEASE REPORT TO [facility] AT THE FIRST DESK WILL DIRECT YOU WHERE TO GO  Date of Procedure:_____________________________________  Arrival Time for Procedure:______________________________  Instructions regarding medication:   _X___ : Hold diabetes medication morning of procedure   __X__:  Hold other medications as follows:_____Lasix the morning of the test   PLEASE NOTIFY THE OFFICE AT LEAST 24 HOURS IN ADVANCE IF YOU ARE UNABLE TO KEEP YOUR APPOINTMENT.  [phone] AND  PLEASE NOTIFY NUCLEAR MEDICINE AT [facility] AT LEAST 24 HOURS IN ADVANCE IF YOU ARE UNABLE TO KEEP YOUR APPOINTMENT. [phone]  How  to prepare for your Myoview test:  1. Do not eat or drink after midnight 2. No caffeine for 24 hours prior to test 3. No smoking 24 hours prior to test. 4. Your medication may be taken with water.  If your doctor stopped a medication because of this test, do not take that medication. 5. Ladies, please do not wear dresses.   Skirts or pants are appropriate. Please wear a short sleeve shirt. 6. No perfume, cologne or lotion. 7. Wear comfortable walking shoes. No heels!

## 2019-09-07 NOTE — Progress Notes (Signed)
Cardiology Office Note   Date:  09/07/2019   ID:  Seth Hardy, DOB 02-Jan-1946, MRN [id number]  PCP:  Rusty Aus, MD  Cardiologist:   Kathlyn Sacramento, MD   Chief Complaint  Patient presents with  . other    F/U for CP      History of Present Illness: Seth Hardy is a 74 y.o. male who Is here today for a follow-up visit regarding multiple cardiac issues. He has a history of coronary artery disease and is status CABG in September 2011 with subsequent bare metal stenting of the right coronary artery in 2013. He also has a history of persistent atrial fibrillation maintained in sinus rhythm on small dose amiodarone and is chronically anticoagulated on Coumadin. He has other chronic medical conditions that include aortic stenosis, hypertension, hyperlipidemia, bilateral carotid arterial disease status post left carotid endarterectomy in April 2017, and diabetes.   Most recent echocardiogram in July 2018 showed normal ejection fraction with mild aortic stenosis.  Mean gradient was 13 mmHg. He has known history of iron deficiency anemia due to gastric polyp which was resected.  He was seen in November of 2019 for worsening angina.  I repeated his echocardiogram which showed normal LV systolic function.  Aortic stenosis was found to be moderate with a mean gradient of 27 mmHg. A right and left cardiac catheterization was performed in December 20 19 which showed significant underlying three-vessel coronary artery disease with patent LIMA to LAD and SVG to OM with known chronically occluded SVG to RCA.  Proximal RCA stent was patent with only mild in-stent restenosis.  However, he was found to have severe calcified stenosis of the distal RCA.  Right heart catheterization showed mildly elevated filling pressures with normal cardiac output.  Aortic stenosis was moderate with a mean gradient of 20 mmHg. The patient underwent staged orbital atherectomy of the distal right coronary artery  with drug-eluting stent placement.   He had a repeat echocardiogram last month which showed normal LV systolic function with stable moderate aortic stenosis with mean gradient of 28 mmHg with a valve area of 1.35.  He had recent episodes of substernal chest pain and tightness lasting 3 minutes with exertion.  This was associated with shortness of breath.  He has no symptoms at rest.  He continues to follow-up with hematology for persistent anemia in spite of IV iron.  He seems to have pancytopenia and was told about the possibility of needing bone marrow biopsy.   Past Medical History:  Diagnosis Date  . Anemia 08/2015   received 2 units rbc one week ago, 3 IV iron infusion -last 1 week.  . Basal cell carcinoma of skin 2012   removed several spots from arms and back  . CAD (coronary artery disease)    a. 01/2010 CABG x 4: LIMA->LAD, VG->D1, VG->OM1, VG->PDA. b. NSTEMI 02/2012 in setting of AF-RVR with cath s/p BMS to RCA (plan for 1 month, possibly up to 3 months of Plavix)  . Carotid disease, bilateral ([place])    a. 03/2011 U/S: 40-59% bilat Carotid dzs;  b. 04/2015 Carotid U/S: 40-59% bilat ICA stenosis; c. 05/2015 CTA Neck: 54% RICA, 65% LICA, mod-marked R vertebral stenosis, mod L vertebral stenosis.  . Chronic kidney disease    small obtusion per pcp. ultrasound done on 08/29/2015  . Diabetes mellitus type II, controlled ([place])    a. Variable CBG 02/2012 - several meds adjusted.  Marland Kitchen Dysrhythmia    intermittent Atrial  Fibrillation  . GERD (gastroesophageal reflux disease)   . Heart murmur   . Hyperlipidemia   . Hypertension   . Hypertensive heart disease   . Iron deficiency anemia    "gets infusions q once in awhile" (04/27/2018)  . Macular edema bil   lazer work done previously  . Mild aortic stenosis    a. 03/2011 Echo: EF 55-60%, Mild AS. b. Mild by cath 02/2012; c. 01/2014 Echo: EF 65-70%, Gr 1 DD, mild AS, mildly dil LA.  . Multiple gastric ulcers 2006  . Myocardial infarction ([place])  02/2012   stents (x1) at time  . Myocardial infarction [facility])    "2nd one was after 02/2012; don't know date" (04/27/2018)  . Obesity   . On home oxygen therapy    "2.5 w/CPAP" (04/27/2018)  . OSA on CPAP    CPAP settings 3 with oxygen 2.5 (04/27/2018)  . PAF (paroxysmal atrial fibrillation) ([place])    a. Newly dx 02/2012 & initiated on Coumadin (spont converted to NSR).  . Shortness of breath dyspnea   . Transfusion history    transfusions -1 month ago -2 units    Past Surgical History:  Procedure Laterality Date  . ANGIOPLASTY    . BASAL CELL CARCINOMA EXCISION     "shoulder, arm X 2"  . CARDIAC CATHETERIZATION  2014  . CATARACT EXTRACTION W/ INTRAOCULAR LENS  IMPLANT, BILATERAL Bilateral 2016  . COLONOSCOPY WITH PROPOFOL N/A 10/15/2015   Procedure: COLONOSCOPY WITH PROPOFOL;  Surgeon: Gatha Mayer, MD;  Location: [facility] ENDOSCOPY;  Service: Endoscopy;  Laterality: N/A;  . CORONARY ANGIOPLASTY     Dr. Fletcher Anon  . CORONARY ARTERY BYPASS GRAFT  2011   CABG X3-4; [place], [place], [place],   . CORONARY ATHERECTOMY  04/27/2018  . CORONARY ATHERECTOMY N/A 04/27/2018   Procedure: CORONARY ATHERECTOMY;  Surgeon: Belva Crome, MD;  Location: [place] CV LAB;  Service: Cardiovascular;  Laterality: N/A;  . cyst L kidney  [place]  . ENDARTERECTOMY Left 08/30/2015   Procedure: ENDARTERECTOMY CAROTID;  Surgeon: Katha Cabal, MD;  Location: [facility] ORS;  Service: Vascular;  Laterality: Left;  . ESOPHAGOGASTRODUODENOSCOPY (EGD) WITH PROPOFOL N/A 10/15/2015   Procedure: ESOPHAGOGASTRODUODENOSCOPY (EGD) WITH PROPOFOL;  Surgeon: Gatha Mayer, MD;  Location: [facility] END[facility];  Service: Endoscopy;  Laterality: N/A;  . EYE SURGERY Bilateral    "has macular edema cauterized" (04/27/2018)  . INGUINAL HERNIA REPAIR Right   . LEFT HEART CATHETERIZATION WITH CORONARY ANGIOGRAM N/A 03/09/2012   Procedure: LEFT HEART CATHETERIZATION WITH CORONARY ANGIOGRAM;  Surgeon: Wellington Hampshire, MD;  Location: [place]  CATH LAB;  Service: Cardiovascular;  Laterality: N/A;  . PERCUTANEOUS CORONARY STENT INTERVENTION (PCI-S)  03/09/2012   Procedure: PERCUTANEOUS CORONARY STENT INTERVENTION (PCI-S);  Surgeon: Wellington Hampshire, MD;  Location: [facility] CATH LAB;  Service: Cardiovascular;;  . RIGHT/LEFT HEART CATH AND CORONARY ANGIOGRAPHY N/A 04/25/2018   Procedure: RIGHT/LEFT HEART CATH AND CORONARY ANGIOGRAPHY;  Surgeon: Wellington Hampshire, MD;  Location: [place] CV LAB;  Service: Cardiovascular;  Laterality: N/A;     Current Outpatient Medications  Medication Sig Dispense Refill  . acetaminophen (TYLENOL) 500 MG tablet Take 1,000 mg by mouth every 6 (six) hours as needed for moderate pain or headache.    . ALPRAZolam (XANAX) 0.25 MG tablet Take 0.25 mg by mouth daily as needed for anxiety.    Marland Kitchen amiodarone (PACERONE) 200 MG tablet TAKE 1/2 TABLET AT BEDTIME 45 tablet 0  . amLODipine (  NORVASC) 5 MG tablet Take one tablet by mouth one time daily 90 tablet 3  . clopidogrel (PLAVIX) 75 MG tablet TAKE 1 TABLET BY MOUTH DAILY 90 tablet 0  . Cyanocobalamin (B-12) 1000 MCG CAPS Take 1,000 mcg by mouth every evening.     . Ferrous Gluconate-C-Folic Acid (IRON-C PO) Take 1 tablet by mouth daily.    . furosemide (LASIX) 20 MG tablet Take 1 tablet (20 mg total) by mouth daily. 30 tablet 6  . glimepiride (AMARYL) 4 MG tablet Take 4 mg by mouth daily.     . insulin glargine (LANTUS) 100 UNIT/ML injection Inject 20 Units into the skin daily.     . nitroGLYCERIN (NITROSTAT) 0.4 MG SL tablet PLACE 1 TABLET UNDER THE TONGUE EVERY 5 MINUTES FOR UP TO 3 DOSES AS NEEDED FOR CHEST PAINS, IF NO RELIEF CALL 911 25 tablet 0  . NOVOLOG FLEXPEN 100 UNIT/ML FlexPen Inject 6-36 Units into the skin 2 (two) times daily with a meal.     . omega-3 acid ethyl esters (LOVAZA) 1 g capsule Take 1 capsule (1 g total) by mouth daily. 90 capsule 4  . Omega-3 Fatty Acids (FISH OIL) 1000 MG CAPS Take 1,000 mg by mouth every evening.    . OXYGEN Inhale  2.5 L/min into the lungs at bedtime. With CPAP    . pantoprazole (PROTONIX) 40 MG tablet Take 1 tablet (40 mg total) by mouth daily. 90 tablet 3  . Polyethylene Glycol 400 (BLINK TEARS OP) Place 1 drop into both eyes daily as needed (dry eyes).    . ramipril (ALTACE) 10 MG capsule Take 10 mg by mouth daily.     . rosuvastatin (CRESTOR) 40 MG tablet TAKE ONE TABLET BY MOUTH EVERY DAY 90 tablet 2  . sucralfate (CARAFATE) 1 g tablet Take 1 g by mouth 2 (two) times daily.    . tadalafil (CIALIS) 10 MG tablet Take 10 mg by mouth as needed.    . vitamin C (ASCORBIC ACID) 500 MG tablet Take 500 mg by mouth every evening.    . warfarin (COUMADIN) 5 MG tablet Take 2.5-5 mg by mouth See admin instructions. Take 5 mg at night on Sun, Mon, Wed, Thur, and Fri.  Take 2.5 mg at night on Tues and Sat    . zinc gluconate 50 MG tablet Take 50 mg by mouth daily.     No current facility-administered medications for this visit.    Allergies:   Patient has no known allergies.    Social History:  The patient  reports that he has never smoked. He has never used smokeless tobacco. He reports current alcohol use. He reports that he does not use drugs.   Family History:  [name] family history includes Bladder Cancer in his mother; COPD in his mother; Cancer in an other family member; Heart attack in his father; Kidney disease in his paternal uncle; Liver cancer in his maternal aunt; Lung cancer in his maternal aunt.    ROS:  Please see the history of present illness.   Otherwise, review of systems are positive for none.   All other systems are reviewed and negative.    PHYSICAL EXAM: VS:  BP (!) 142/58   Pulse (!) 48   Ht 5' 8"  (1.727 m)   Wt 182 lb 12.8 oz (82.9 kg)   SpO2 96%   BMI 27.79 kg/m  , BMI Body mass index is 27.79 kg/m. GEN: Well nourished, well developed, in no acute  distress  HEENT: normal  Neck: no JVD, carotid bruits, or masses Cardiac: RRR; no rubs, or gallops, mild bilateral leg  edema . There is a 3/6 crescendo decrescendo systolic murmur in the aortic area which is mid peaking.  S2 is diminished.  The murmur radiates to the carotid arteries. Respiratory:  clear to auscultation bilaterally, normal work of breathing GI: soft, nontender, nondistended, + BS MS: no deformity or atrophy  Skin: warm and dry, no rash Neuro:  Strength and sensation are intact Psych: euthymic mood, full affect   EKG:  EKG  ordered today. EKG showed sinus bradycardia with anterolateral ST changes suggestive of ischemia.   Recent Labs: 07/12/2019: BUN 24; Creatinine, Ser 1.38; Potassium 4.8; Sodium 137 09/06/2019: Hemoglobin 10.7; Platelets 148    Lipid Panel    Component Value Date/Time   CHOL 99 03/09/2012 0243   TRIG 84 03/09/2012 0243   HDL 37 (L) 03/09/2012 0243   CHOLHDL 2.7 03/09/2012 0243   VLDL 17 03/09/2012 0243   LDLCALC 45 03/09/2012 0243      Wt Readings from Last 3 Encounters:  09/07/19 182 lb 12.8 oz (82.9 kg)  09/06/19 183 lb (83 kg)  08/04/19 180 lb 2 oz (81.7 kg)        ASSESSMENT AND PLAN:   1.  Coronary artery disease involving bypass graft with recent episodes of exertional chest pain: Not as intense as most recent PCI but still concerning given his history.  I requested a Lexiscan Myoview for evaluation.    Continue Plavix without aspirin given that he is on warfarin. We can consider stopping Plavix given that he is on warfarin as long as his cardiac work-up is negative.  2. Bilateral carotid artery stenosis: Status post  left carotid endarterectomy. Followed by Dr. Delana Meyer.  3. moderate aortic stenosis: This was moderate on most recent echocardiogram from last month.  We are planning repeat echo in March of next year.  4. Persistent atrial fibrillation: Maintaining in sinus rhythm with small dose amiodarone.    Continue anticoagulation with warfarin.    5. Hyperlipidemia: Continue high-dose rosuvastatin.  His LDL has been below 70.      Disposition:   Follow-up in 6 months   Signed,  Kathlyn Sacramento, MD  09/07/2019 3:55 PM    [place]

## 2019-09-08 ENCOUNTER — Ambulatory Visit
Admission: RE | Admit: 2019-09-08 | Discharge: 2019-09-08 | Disposition: A | Discharge status: Home / Self Care | Payer: PPO | Source: Ambulatory Visit | Attending: Cardiovascular Disease | Admitting: Cardiovascular Disease

## 2019-09-08 ENCOUNTER — Telehealth: Payer: Self-pay | Admitting: Cardiovascular Disease

## 2019-09-08 ENCOUNTER — Other Ambulatory Visit: Payer: Self-pay

## 2019-09-08 DIAGNOSIS — I25708 Atherosclerosis of coronary artery bypass graft(s), unspecified, with other forms of angina pectoris: Secondary | ICD-10-CM | POA: Diagnosis not present

## 2019-09-08 DIAGNOSIS — R072 Precordial pain: Secondary | ICD-10-CM | POA: Diagnosis not present

## 2019-09-08 LAB — NM MYOCAR MULTI W/SPECT W/WALL MOTION / EF
Estimated workload: 1 METS
Exercise duration (min): 0 min
Exercise duration (sec): 0 s
LV dias vol: 113 mL (ref 62–150)
LV sys vol: 36 mL
MPHR: 147 {beats}/min
Peak HR: 76 {beats}/min
Percent HR: 51 %
Rest HR: 47 {beats}/min
SDS: 10
SRS: 2
SSS: 8
TID: 1.19

## 2019-09-08 MED ORDER — REGADENOSON 0.4 MG/5ML IV SOLN
0.4000 mg | Freq: Once | INTRAVENOUS | Status: AC
  Administered 2019-09-08: 11:00:00 0.4 mg via INTRAVENOUS

## 2019-09-08 MED ORDER — TECHNETIUM TC 99M TETROFOSMIN IV KIT
32.1080 | PACK | Freq: Once | INTRAVENOUS | Status: AC | PRN
  Administered 2019-09-08: 11:00:00 32.108 via INTRAVENOUS

## 2019-09-08 MED ORDER — TECHNETIUM TC 99M TETROFOSMIN IV KIT
10.8000 | PACK | Freq: Once | INTRAVENOUS | Status: AC | PRN
  Administered 2019-09-08: 10.8 via INTRAVENOUS

## 2019-09-08 NOTE — Telephone Encounter (Signed)
Patient calling in to speak about stress test from this morning. Calling to see if has been read yet since patient is planning to go out of town  Please advise

## 2019-09-08 NOTE — Telephone Encounter (Signed)
Secure chat messaged received from Dr. Rockey Situ and Dr. Fletcher Anon.     Seth Hardy , please let him know that his stress showed only mild ischemia which is expected based on his most recent cath . Nothing significant Continue to monitor symptoms  Patient made aware of stress test results with verbalized understanding. Patient voiced appreciation for the call.

## 2019-09-08 NOTE — Telephone Encounter (Addendum)
Spoke with the patient and advised him that his stress test results are still pending. Adv him that once they are read by the reading Cardiologist they will need to be reviewed by Dr. Fletcher Anon.  Patient sts that he was suppose to leave for his golfing trip today but he will wait to hear back before leaving. Adv the patient that it will probably be early next week.  Advised the patient that I will fwd the message to Dr. Fletcher Anon to review and give his recommendation as soons as he has the opportunity.  Patient voiced appreciation for the call back and the assistance.

## 2019-09-17 ENCOUNTER — Telehealth: Payer: Self-pay | Admitting: Nurse Practitioner

## 2019-09-17 DIAGNOSIS — E785 Hyperlipidemia, unspecified: Secondary | ICD-10-CM | POA: Diagnosis not present

## 2019-09-17 DIAGNOSIS — Z7901 Long term (current) use of anticoagulants: Secondary | ICD-10-CM | POA: Diagnosis not present

## 2019-09-17 DIAGNOSIS — I2510 Atherosclerotic heart disease of native coronary artery without angina pectoris: Secondary | ICD-10-CM | POA: Diagnosis not present

## 2019-09-17 DIAGNOSIS — I214 Non-ST elevation (NSTEMI) myocardial infarction: Secondary | ICD-10-CM | POA: Diagnosis not present

## 2019-09-17 DIAGNOSIS — R079 Chest pain, unspecified: Secondary | ICD-10-CM | POA: Diagnosis not present

## 2019-09-17 DIAGNOSIS — D509 Iron deficiency anemia, unspecified: Secondary | ICD-10-CM | POA: Diagnosis not present

## 2019-09-17 DIAGNOSIS — R0789 Other chest pain: Secondary | ICD-10-CM | POA: Diagnosis not present

## 2019-09-17 DIAGNOSIS — I4891 Unspecified atrial fibrillation: Secondary | ICD-10-CM | POA: Diagnosis not present

## 2019-09-17 DIAGNOSIS — D539 Nutritional anemia, unspecified: Secondary | ICD-10-CM | POA: Diagnosis not present

## 2019-09-17 DIAGNOSIS — Z743 Need for continuous supervision: Secondary | ICD-10-CM | POA: Diagnosis not present

## 2019-09-17 DIAGNOSIS — E119 Type 2 diabetes mellitus without complications: Secondary | ICD-10-CM | POA: Diagnosis not present

## 2019-09-17 DIAGNOSIS — I350 Nonrheumatic aortic (valve) stenosis: Secondary | ICD-10-CM | POA: Diagnosis not present

## 2019-09-17 DIAGNOSIS — Z794 Long term (current) use of insulin: Secondary | ICD-10-CM | POA: Diagnosis not present

## 2019-09-17 DIAGNOSIS — I480 Paroxysmal atrial fibrillation: Secondary | ICD-10-CM | POA: Diagnosis not present

## 2019-09-17 DIAGNOSIS — Z7902 Long term (current) use of antithrombotics/antiplatelets: Secondary | ICD-10-CM | POA: Diagnosis not present

## 2019-09-17 DIAGNOSIS — R778 Other specified abnormalities of plasma proteins: Secondary | ICD-10-CM | POA: Diagnosis not present

## 2019-09-17 DIAGNOSIS — I10 Essential (primary) hypertension: Secondary | ICD-10-CM | POA: Diagnosis not present

## 2019-09-17 DIAGNOSIS — I25810 Atherosclerosis of coronary artery bypass graft(s) without angina pectoris: Secondary | ICD-10-CM | POA: Diagnosis not present

## 2019-09-17 DIAGNOSIS — Z951 Presence of aortocoronary bypass graft: Secondary | ICD-10-CM | POA: Diagnosis not present

## 2019-09-17 DIAGNOSIS — T82855A Stenosis of coronary artery stent, initial encounter: Secondary | ICD-10-CM | POA: Diagnosis not present

## 2019-09-17 NOTE — Telephone Encounter (Signed)
   Pts wife called to let us know that pt developed Afib w/ RVR this weekend and has been admitted to [facility] in [place].  He has had some chest pain and his wife is concerned that he has had a heart attack.  He is apparently stable and being cared for by cardiology.  I offered condolences and advised that I'd pass the news onto Dr. Fletcher Anon.  Caller was grateful for the call back.  Murray Hodgkins, NP 09/17/2019, 11:17 AM

## 2019-09-18 ENCOUNTER — Telehealth: Payer: Self-pay | Admitting: Cardiovascular Disease

## 2019-09-18 NOTE — Telephone Encounter (Signed)
Patient spouse calling  States that patient did have a heart attack while at [facility] and there was some damage done to heart  Patient is currently awaiting cath at [facility] Patient spouse wanting to speak with Dr Fletcher Anon or Angelica Ran before cath if at all possible - stated the doctors were wanting to speak with them as well Please call to discuss

## 2019-09-18 NOTE — Telephone Encounter (Addendum)
Spoke with the patients wife. She is not currently with the patient. She left the hospital  to go home and shower and plans on going back in 1-2 hours. She says that the patient had a confirmed MI and that the Cardiologist taking care of the patient at [facility] in [place] is wanting to talk with Dr. Fletcher Anon directly before taking the patient to the cath lab.  She is unable to provide a physician name or contact info. She says that the patient was seen by so many physicians she is not sure who is who. She asked that Dr. Fletcher Anon contact the patient on his cell at [phone]. The patient will be able to give the provider info. Adv the patients wife that I will get the message to Dr. Fletcher Anon (secure chat message sent to Dr. Fletcher Anon).  Patients wife was tearful during the conversation, provided comfort. Adv her that I will f/u with her tomorrow.

## 2019-09-19 NOTE — Telephone Encounter (Signed)
Contacted the patient as discussed yesterday. lmtcb if our assistance is needed.

## 2019-09-19 NOTE — Telephone Encounter (Signed)
Spoke with the patients wife. She sts that the patients cath went well and he did require a PCI. The patient and the cardiologist in [facility] were able to talk with Dr. Fletcher Anon. The plan is for the patient to be d/c today. They plan on heading home tomorrow. The patients wife is requesting a f/u appt with Dr. Fletcher Anon. Appt scheduled for 09/26/19 @ 4:40pm. Patients wife voiced appreciation for the assistance.

## 2019-09-26 ENCOUNTER — Other Ambulatory Visit: Payer: Self-pay

## 2019-09-26 ENCOUNTER — Encounter: Payer: Self-pay | Admitting: Cardiovascular Disease

## 2019-09-26 ENCOUNTER — Ambulatory Visit (INDEPENDENT_AMBULATORY_CARE_PROVIDER_SITE_OTHER): Payer: PPO | Admitting: Cardiovascular Disease

## 2019-09-26 VITALS — BP 126/60 | HR 45 | Ht 68.0 in | Wt 182.5 lb

## 2019-09-26 DIAGNOSIS — I25810 Atherosclerosis of coronary artery bypass graft(s) without angina pectoris: Secondary | ICD-10-CM | POA: Diagnosis not present

## 2019-09-26 DIAGNOSIS — E782 Mixed hyperlipidemia: Secondary | ICD-10-CM

## 2019-09-26 DIAGNOSIS — I359 Nonrheumatic aortic valve disorder, unspecified: Secondary | ICD-10-CM | POA: Diagnosis not present

## 2019-09-26 DIAGNOSIS — I10 Essential (primary) hypertension: Secondary | ICD-10-CM

## 2019-09-26 DIAGNOSIS — I4819 Other persistent atrial fibrillation: Secondary | ICD-10-CM

## 2019-09-26 NOTE — Progress Notes (Signed)
Cardiology Office Note   Date:  09/28/2019   ID:  Seth Hardy, DOB Apr 04, 1946, MRN [id number]  PCP:  Rusty Aus, MD  Cardiologist:   Kathlyn Sacramento, MD   Chief Complaint  Patient presents with  . other    [facility] MI post cath x1 stent.. Meds reviewed verbally with pt.      History of Present Illness: Seth Hardy is a 75 y.o. male who Is here today for a follow-up visit regarding multiple cardiac issues. He has a history of coronary artery disease and is status CABG in September 2011 with subsequent bare metal stenting of the right coronary artery in 2013. He also has a history of persistent atrial fibrillation maintained in sinus rhythm on small dose amiodarone and is chronically anticoagulated on Coumadin. He has other chronic medical conditions that include aortic stenosis, hypertension, hyperlipidemia, bilateral carotid arterial disease status post left carotid endarterectomy in April 2017, and diabetes.   Most recent echocardiogram in July 2018 showed normal ejection fraction with mild aortic stenosis.  Mean gradient was 13 mmHg. He has known history of iron deficiency anemia due to gastric polyp which was resected.  He was seen in November of 2019 for worsening angina.  I repeated his echocardiogram which showed normal LV systolic function.  Aortic stenosis was found to be moderate with a mean gradient of 27 mmHg. A right and left cardiac catheterization was performed in December 20 19 which showed significant underlying three-vessel coronary artery disease with patent LIMA to LAD and SVG to OM with known chronically occluded SVG to RCA.  Proximal RCA stent was patent with only mild in-stent restenosis.  However, he was found to have severe calcified stenosis of the distal RCA.  Right heart catheterization showed mildly elevated filling pressures with normal cardiac output.  Aortic stenosis was moderate with a mean gradient of 20 mmHg.  The patient underwent staged orbital atherectomy of the distal right coronary artery with drug-eluting stent placement.   Echocardiogram in March of this year showed normal LV systolic function with stable moderate aortic stenosis with mean gradient of 28 mmHg with a valve area of 1.35.  He had recent episode of chest pain that was evaluated with a Lexiscan Myoview which showed only mild ischemia in the proximal portion of the anterior wall. The patient went to the beach recently and had recurrent chest pain there.  He went to [facility] in [place] where he was found to have  non-ST elevation myocardial infarction.  He was noted to have intermittent atrial fibrillation as well.  He underwent left heart catheterization which showed significant restenosis in the distal RCA stent.  This was treated with PCI and adding another drug-eluting stent.  The details are not available although I did speak with the cardiologist there before he did the cardiac cath and gave him an overview of the patient's complex cardiac history.  Patient feels better with no recurrent chest pain.  He was discharged home on aspirin in addition to what he was on before including warfarin and clopidogrel.  The patient has history of iron deficiency anemia currently on IV iron.  He bruises very easily.  Past Medical History:  Diagnosis Date  . Anemia 08/2015   received 2 units rbc one week ago, 3 IV iron infusion -last 1 week.  . Basal cell carcinoma of skin 2012   removed several spots from arms and back  .  CAD (coronary artery disease)    a. 01/2010 CABG x 4: LIMA->LAD, VG->D1, VG->OM1, VG->PDA. b. NSTEMI 02/2012 in setting of AF-RVR with cath s/p BMS to RCA (plan for 1 month, possibly up to 3 months of Plavix)  . Carotid disease, bilateral ([place])    a. 03/2011 U/S: 40-59% bilat Carotid dzs;  b. 04/2015 Carotid U/S: 40-59% bilat ICA stenosis; c. 05/2015 CTA Neck: [id number] RICA, [id number] LICA, mod-marked R vertebral  stenosis, mod L vertebral stenosis.  . Chronic kidney disease    small obtusion per pcp. ultrasound done on 08/29/2015  . Diabetes mellitus type II, controlled ([place])    a. Variable CBG 02/2012 - several meds adjusted.  Marland Kitchen Dysrhythmia    intermittent Atrial Fibrillation  . GERD (gastroesophageal reflux disease)   . Heart murmur   . Hyperlipidemia   . Hypertension   . Hypertensive heart disease   . Iron deficiency anemia    "gets infusions q once in awhile" (04/27/2018)  . Macular edema bil   lazer work done previously  . Mild aortic stenosis    a. 03/2011 Echo: EF 55-60%, Mild AS. b. Mild by cath 02/2012; c. 01/2014 Echo: EF 65-70%, Gr 1 DD, mild AS, mildly dil LA.  . Multiple gastric ulcers 2006  . Myocardial infarction ([place]) 02/2012   stents (x1) at time  . Myocardial infarction [facility])    "2nd one was after 02/2012; don't know date" (04/27/2018)  . Obesity   . On home oxygen therapy    "2.5 w/CPAP" (04/27/2018)  . OSA on CPAP    CPAP settings 3 with oxygen 2.5 (04/27/2018)  . PAF (paroxysmal atrial fibrillation) ([place])    a. Newly dx 02/2012 & initiated on Coumadin (spont converted to NSR).  . Shortness of breath dyspnea   . Transfusion history    transfusions -1 month ago -2 units    Past Surgical History:  Procedure Laterality Date  . ANGIOPLASTY    . BASAL CELL CARCINOMA EXCISION     "shoulder, arm X 2"  . CARDIAC CATHETERIZATION  2014  . CARDIAC CATHETERIZATION  2021   [facility] x1 stent  . CATARACT EXTRACTION W/ INTRAOCULAR LENS  IMPLANT, BILATERAL Bilateral 2016  . COLONOSCOPY WITH PROPOFOL N/A 10/15/2015   Procedure: COLONOSCOPY WITH PROPOFOL;  Surgeon: Gatha Mayer, MD;  Location: [facility] ENDOSCOPY;  Service: Endoscopy;  Laterality: N/A;  . CORONARY ANGIOPLASTY     Dr. Fletcher Anon  . CORONARY ARTERY BYPASS GRAFT  2011   CABG X3-4; [place], [place], [place],   . CORONARY ATHERECTOMY  04/27/2018  . CORONARY ATHERECTOMY N/A 04/27/2018   Procedure: CORONARY  ATHERECTOMY;  Surgeon: Belva Crome, MD;  Location: [place] CV LAB;  Service: Cardiovascular;  Laterality: N/A;  . cyst L kidney  [place]  . ENDARTERECTOMY Left 08/30/2015   Procedure: ENDARTERECTOMY CAROTID;  Surgeon: Katha Cabal, MD;  Location: [facility] ORS;  Service: Vascular;  Laterality: Left;  . ESOPHAGOGASTRODUODENOSCOPY (EGD) WITH PROPOFOL N/A 10/15/2015   Procedure: ESOPHAGOGASTRODUODENOSCOPY (EGD) WITH PROPOFOL;  Surgeon: Gatha Mayer, MD;  Location: [facility] ENDOSCOPY;  Service: Endoscopy;  Laterality: N/A;  . EYE SURGERY Bilateral    "has macular edema cauterized" (04/27/2018)  . INGUINAL HERNIA REPAIR Right   . LEFT HEART CATHETERIZATION WITH CORONARY ANGIOGRAM N/A 03/09/2012   Procedure: LEFT HEART CATHETERIZATION WITH CORONARY ANGIOGRAM;  Surgeon: Wellington Hampshire, MD;  Location: [place] CATH LAB;  Service: Cardiovascular;  Laterality: N/A;  . PERCUTANEOUS CORONARY  STENT INTERVENTION (PCI-S)  03/09/2012   Procedure: PERCUTANEOUS CORONARY STENT INTERVENTION (PCI-S);  Surgeon: Wellington Hampshire, MD;  Location[facility] CATH LAB;  Service: Cardiovascular;;  . RIGHT/LEFT HEART CATH AND CORONARY ANGIOGRAPHY N/A 04/25/2018   Procedure: RIGHT/LEFT HEART CATH AND CORONARY ANGIOGRAPHY;  Surgeon: Wellington Hampshire, MD;  Location: [place] CV LAB;  Service: Cardiovascular;  Laterality: N/A;     Current Outpatient Medications  Medication Sig Dispense Refill  . acetaminophen (TYLENOL) 500 MG tablet Take 1,000 mg by mouth every 6 (six) hours as needed for moderate pain or headache.    . ALPRAZolam (XANAX) 0.25 MG tablet Take 0.25 mg by mouth daily as needed for anxiety.    Marland Kitchen amiodarone (PACERONE) 200 MG tablet TAKE 1/2 TABLET AT BEDTIME 45 tablet 0  . amLODipine (NORVASC) 5 MG tablet Take one tablet by mouth one time daily 90 tablet 3  . clopidogrel (PLAVIX) 75 MG tablet TAKE 1 TABLET BY MOUTH DAILY 90 tablet 0  . Cyanocobalamin (B-12) 1000 MCG CAPS Take 1,000 mcg by mouth every  evening.     . Ferrous Gluconate-C-Folic Acid (IRON-C PO) Take 1 tablet by mouth daily.    . furosemide (LASIX) 20 MG tablet Take 1 tablet (20 mg total) by mouth daily. 30 tablet 6  . glimepiride (AMARYL) 4 MG tablet Take 4 mg by mouth daily.     . insulin glargine (LANTUS) 100 UNIT/ML injection Inject 20 Units into the skin daily.     . nitroGLYCERIN (NITROSTAT) 0.4 MG SL tablet PLACE 1 TABLET UNDER THE TONGUE EVERY 5 MINUTES FOR UP TO 3 DOSES AS NEEDED FOR CHEST PAINS, IF NO RELIEF CALL 911 25 tablet 0  . NOVOLOG FLEXPEN 100 UNIT/ML FlexPen Inject 6-36 Units into the skin 2 (two) times daily with a meal.     . omega-3 acid ethyl esters (LOVAZA) 1 g capsule Take 1 capsule (1 g total) by mouth daily. 90 capsule 4  . Omega-3 Fatty Acids (FISH OIL) 1000 MG CAPS Take 1,000 mg by mouth every evening.    . OXYGEN Inhale 2.5 L/min into the lungs at bedtime. With CPAP    . pantoprazole (PROTONIX) 40 MG tablet Take 1 tablet (40 mg total) by mouth daily. 90 tablet 3  . Polyethylene Glycol 400 (BLINK TEARS OP) Place 1 drop into both eyes daily as needed (dry eyes).    . ramipril (ALTACE) 10 MG capsule Take 10 mg by mouth daily.     . rosuvastatin (CRESTOR) 40 MG tablet TAKE ONE TABLET BY MOUTH EVERY DAY 90 tablet 2  . sucralfate (CARAFATE) 1 g tablet Take 1 g by mouth 2 (two) times daily.    . vitamin C (ASCORBIC ACID) 500 MG tablet Take 500 mg by mouth every evening.    . warfarin (COUMADIN) 5 MG tablet Take 2.5-5 mg by mouth See admin instructions. Take 5 mg at night on Sun, Mon, Wed, Thur, and Fri.  Take 2.5 mg at night on Tues and Sat    . zinc gluconate 50 MG tablet Take 50 mg by mouth daily.    . tadalafil (CIALIS) 10 MG tablet Take 10 mg by mouth as needed.     No current facility-administered medications for this visit.    Allergies:   Patient has no known allergies.    Social History:  The patient  reports that he has never smoked. He has never used smokeless tobacco. He reports current  alcohol use. He reports that he does  not use drugs.   Family History:  [name] family history includes Bladder Cancer in his mother; COPD in his mother; Cancer in an other family member; Heart attack in his father; Kidney disease in his paternal uncle; Liver cancer in his maternal aunt; Lung cancer in his maternal aunt.    ROS:  Please see the history of present illness.   Otherwise, review of systems are positive for none.   All other systems are reviewed and negative.    PHYSICAL EXAM: VS:  BP 126/60 (BP Location: Left Arm, Patient Position: Sitting, Cuff Size: Normal)   Pulse (!) 45   Ht 5\' 8"  (1.727 m)   Wt 182 lb 8 oz (82.8 kg)   SpO2 98%   BMI 27.75 kg/m  , BMI Body mass index is 27.75 kg/m. GEN: Well nourished, well developed, in no acute distress  HEENT: normal  Neck: no JVD, carotid bruits, or masses Cardiac: RRR; no rubs, or gallops, mild bilateral leg edema . There is a 3/6 crescendo decrescendo systolic murmur in the aortic area which is mid peaking.  S2 is diminished.  The murmur radiates to the carotid arteries. Respiratory:  clear to auscultation bilaterally, normal work of breathing GI: soft, nontender, nondistended, + BS MS: no deformity or atrophy  Skin: warm and dry, no rash Neuro:  Strength and sensation are intact Psych: euthymic mood, full affect Left radial pulses normal with no hematoma   EKG:  EKG  ordered today. EKG showed sinus bradycardia with LVH with repolarization abnormalities.  Heart rate is 45 bpm   Recent Labs: 07/12/2019: BUN 24; Creatinine, Ser 1.38; Potassium 4.8; Sodium 137 09/06/2019: Hemoglobin 10.7; Platelets 148    Lipid Panel    Component Value Date/Time   CHOL 99 03/09/2012 0243   TRIG 84 03/09/2012 0243   HDL 37 (L) 03/09/2012 0243   CHOLHDL 2.7 03/09/2012 0243   VLDL 17 03/09/2012 0243   LDLCALC 45 03/09/2012 0243      Wt Readings from Last 3 Encounters:  09/26/19 182 lb 8 oz (82.8 kg)  09/07/19 182 lb 12.8 oz  (82.9 kg)  09/06/19 183 lb (83 kg)        ASSESSMENT AND PLAN:   1.  Coronary artery disease involving bypass graft with recent non-ST elevation myocardial infarction: He was treated with PCI and drug-eluting stent placement for distal RCA in-stent restenosis.  He is very high risk for bleeding and has underlying iron deficiency anemia.  Thus, I elected to discontinue aspirin and keep him on clopidogrel monotherapy given that he is on long-term anticoagulation with warfarin.   2. Bilateral carotid artery stenosis: Status post  left carotid endarterectomy. Followed by Dr. Delana Meyer.  3. moderate aortic stenosis: Repeat echocardiogram in March 2022.  4. Persistent atrial fibrillation: Maintaining in sinus rhythm with small dose amiodarone.    Continue anticoagulation with warfarin.  The patient has underlying bradycardia and might ultimately require a permanent pacemaker if he starts having more episodes of atrial fibrillation requiring escalation of therapy.  5. Hyperlipidemia: Continue high-dose rosuvastatin.  His LDL has been below 70.    Disposition:   Follow-up in 3 months   Signed,  Kathlyn Sacramento, MD  09/28/2019 7:17 AM    [place]

## 2019-09-26 NOTE — Patient Instructions (Signed)
Medication Instructions:  Your physician has recommended you make the following change in your medication:   STOP Aspirin  Continue all your other medications.  *If you need a refill on your cardiac medications before your next appointment, please call your pharmacy*   Lab Work: None ordered If you have labs (blood work) drawn today and your tests are completely normal, you will receive your results only by: Marland Kitchen MyChart Message (if you have MyChart) OR . A paper copy in the mail If you have any lab test that is abnormal or we need to change your treatment, we will call you to review the results.   Testing/Procedures: None ordered   Follow-Up: At [facility], you and your health needs are our priority.  As part of our continuing mission to provide you with exceptional heart care, we have created designated Provider Care Teams.  These Care Teams include your primary Cardiologist (physician) and Advanced Practice Providers (APPs -  Physician Assistants and Nurse Practitioners) who all work together to provide you with the care you need, when you need it.  We recommend signing up for the patient portal called "MyChart".  Sign up information is provided on this After Visit Summary.  MyChart is used to connect with patients for Virtual Visits (Telemedicine).  Patients are able to view lab/test results, encounter notes, upcoming appointments, etc.  Non-urgent messages can be sent to your provider as well.   To learn more about what you can do with MyChart, go to [email or web address].    Your next appointment:   3 month(s)  The format for your next appointment:   In Person  Provider:    You may see Kathlyn Sacramento, MD or one of the following Advanced Practice Providers on your designated Care Team:    Murray Hodgkins, NP  Christell Faith, PA-C  Marrianne Mood, PA-C    Other Instructions N/A

## 2019-10-05 DIAGNOSIS — E1165 Type 2 diabetes mellitus with hyperglycemia: Secondary | ICD-10-CM | POA: Diagnosis not present

## 2019-10-05 DIAGNOSIS — E538 Deficiency of other specified B group vitamins: Secondary | ICD-10-CM | POA: Diagnosis not present

## 2019-10-05 DIAGNOSIS — E1139 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: Secondary | ICD-10-CM | POA: Diagnosis not present

## 2019-10-12 DIAGNOSIS — E538 Deficiency of other specified B group vitamins: Secondary | ICD-10-CM | POA: Diagnosis not present

## 2019-10-12 DIAGNOSIS — Z0000 Encounter for general adult medical examination without abnormal findings: Secondary | ICD-10-CM | POA: Diagnosis not present

## 2019-10-12 DIAGNOSIS — D500 Iron deficiency anemia secondary to blood loss (chronic): Secondary | ICD-10-CM | POA: Diagnosis not present

## 2019-10-12 DIAGNOSIS — Z125 Encounter for screening for malignant neoplasm of prostate: Secondary | ICD-10-CM | POA: Diagnosis not present

## 2019-10-12 DIAGNOSIS — E1139 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: Secondary | ICD-10-CM | POA: Diagnosis not present

## 2019-10-12 DIAGNOSIS — E1165 Type 2 diabetes mellitus with hyperglycemia: Secondary | ICD-10-CM | POA: Diagnosis not present

## 2019-10-24 DIAGNOSIS — G4733 Obstructive sleep apnea (adult) (pediatric): Secondary | ICD-10-CM | POA: Diagnosis not present

## 2019-10-30 ENCOUNTER — Other Ambulatory Visit: Payer: Self-pay

## 2019-10-30 ENCOUNTER — Ambulatory Visit (INDEPENDENT_AMBULATORY_CARE_PROVIDER_SITE_OTHER): Payer: PPO

## 2019-10-30 DIAGNOSIS — I4891 Unspecified atrial fibrillation: Secondary | ICD-10-CM

## 2019-10-30 DIAGNOSIS — Z7901 Long term (current) use of anticoagulants: Secondary | ICD-10-CM

## 2019-10-30 LAB — POCT INR: INR: 2.3 (ref 2.0–3.0)

## 2019-10-30 NOTE — Patient Instructions (Signed)
Please continue dosage of 1/2 tablet daily except 1 tablet on [place].  Recheck INR in 6 weeks.

## 2019-11-10 ENCOUNTER — Encounter: Payer: Self-pay | Admitting: Internal Medicine

## 2019-11-10 ENCOUNTER — Inpatient Hospital Stay: Payer: PPO | Attending: Oncology

## 2019-11-10 ENCOUNTER — Other Ambulatory Visit: Payer: Self-pay

## 2019-11-10 ENCOUNTER — Inpatient Hospital Stay (HOSPITAL_BASED_OUTPATIENT_CLINIC_OR_DEPARTMENT_OTHER): Payer: PPO | Admitting: Internal Medicine

## 2019-11-10 ENCOUNTER — Inpatient Hospital Stay: Payer: PPO

## 2019-11-10 VITALS — BP 139/52 | HR 54 | Resp 18

## 2019-11-10 DIAGNOSIS — D72819 Decreased white blood cell count, unspecified: Secondary | ICD-10-CM | POA: Diagnosis not present

## 2019-11-10 DIAGNOSIS — I10 Essential (primary) hypertension: Secondary | ICD-10-CM | POA: Insufficient documentation

## 2019-11-10 DIAGNOSIS — E669 Obesity, unspecified: Secondary | ICD-10-CM | POA: Diagnosis not present

## 2019-11-10 DIAGNOSIS — D696 Thrombocytopenia, unspecified: Secondary | ICD-10-CM | POA: Insufficient documentation

## 2019-11-10 DIAGNOSIS — I480 Paroxysmal atrial fibrillation: Secondary | ICD-10-CM | POA: Diagnosis not present

## 2019-11-10 DIAGNOSIS — D500 Iron deficiency anemia secondary to blood loss (chronic): Secondary | ICD-10-CM

## 2019-11-10 DIAGNOSIS — Z8673 Personal history of transient ischemic attack (TIA), and cerebral infarction without residual deficits: Secondary | ICD-10-CM | POA: Insufficient documentation

## 2019-11-10 DIAGNOSIS — Z801 Family history of malignant neoplasm of trachea, bronchus and lung: Secondary | ICD-10-CM | POA: Insufficient documentation

## 2019-11-10 DIAGNOSIS — D539 Nutritional anemia, unspecified: Secondary | ICD-10-CM

## 2019-11-10 DIAGNOSIS — E785 Hyperlipidemia, unspecified: Secondary | ICD-10-CM | POA: Insufficient documentation

## 2019-11-10 DIAGNOSIS — I4891 Unspecified atrial fibrillation: Secondary | ICD-10-CM | POA: Insufficient documentation

## 2019-11-10 DIAGNOSIS — Z79899 Other long term (current) drug therapy: Secondary | ICD-10-CM | POA: Insufficient documentation

## 2019-11-10 DIAGNOSIS — Z8711 Personal history of peptic ulcer disease: Secondary | ICD-10-CM | POA: Diagnosis not present

## 2019-11-10 DIAGNOSIS — Z8052 Family history of malignant neoplasm of bladder: Secondary | ICD-10-CM | POA: Diagnosis not present

## 2019-11-10 DIAGNOSIS — Z794 Long term (current) use of insulin: Secondary | ICD-10-CM | POA: Diagnosis not present

## 2019-11-10 DIAGNOSIS — Z800 Family history of malignant neoplasm of digestive organs: Secondary | ICD-10-CM | POA: Diagnosis not present

## 2019-11-10 DIAGNOSIS — I252 Old myocardial infarction: Secondary | ICD-10-CM | POA: Diagnosis not present

## 2019-11-10 DIAGNOSIS — D509 Iron deficiency anemia, unspecified: Secondary | ICD-10-CM | POA: Insufficient documentation

## 2019-11-10 DIAGNOSIS — R5383 Other fatigue: Secondary | ICD-10-CM | POA: Insufficient documentation

## 2019-11-10 DIAGNOSIS — Z7982 Long term (current) use of aspirin: Secondary | ICD-10-CM | POA: Diagnosis not present

## 2019-11-10 DIAGNOSIS — I2510 Atherosclerotic heart disease of native coronary artery without angina pectoris: Secondary | ICD-10-CM | POA: Diagnosis not present

## 2019-11-10 DIAGNOSIS — K219 Gastro-esophageal reflux disease without esophagitis: Secondary | ICD-10-CM | POA: Insufficient documentation

## 2019-11-10 DIAGNOSIS — Z7901 Long term (current) use of anticoagulants: Secondary | ICD-10-CM | POA: Insufficient documentation

## 2019-11-10 LAB — CBC WITH DIFFERENTIAL/PLATELET
Abs Immature Granulocytes: 0.03 10*3/uL (ref 0.00–0.07)
Basophils Absolute: 0 10*3/uL (ref 0.0–0.1)
Basophils Relative: 1 %
Eosinophils Absolute: 0.1 10*3/uL (ref 0.0–0.5)
Eosinophils Relative: 2 %
HCT: 30.1 % — ABNORMAL LOW (ref 39.0–52.0)
Hemoglobin: 9.3 g/dL — ABNORMAL LOW (ref 13.0–17.0)
Immature Granulocytes: 1 %
Lymphocytes Relative: 20 %
Lymphs Abs: 0.6 10*3/uL — ABNORMAL LOW (ref 0.7–4.0)
MCH: 31.2 pg (ref 26.0–34.0)
MCHC: 30.9 g/dL (ref 30.0–36.0)
MCV: 101 fL — ABNORMAL HIGH (ref 80.0–100.0)
Monocytes Absolute: 0.4 10*3/uL (ref 0.1–1.0)
Monocytes Relative: 12 %
Neutro Abs: 2.1 10*3/uL (ref 1.7–7.7)
Neutrophils Relative %: 64 %
Platelets: 197 10*3/uL (ref 150–400)
RBC: 2.98 MIL/uL — ABNORMAL LOW (ref 4.22–5.81)
RDW: 13.4 % (ref 11.5–15.5)
WBC: 3.2 10*3/uL — ABNORMAL LOW (ref 4.0–10.5)
nRBC: 0 % (ref 0.0–0.2)

## 2019-11-10 LAB — RETICULOCYTES
Immature Retic Fract: 21.2 % — ABNORMAL HIGH (ref 2.3–15.9)
RBC.: 2.93 MIL/uL — ABNORMAL LOW (ref 4.22–5.81)
Retic Count, Absolute: 134.2 10*3/uL (ref 19.0–186.0)
Retic Ct Pct: 4.6 % — ABNORMAL HIGH (ref 0.4–3.1)

## 2019-11-10 LAB — LACTATE DEHYDROGENASE: LDH: 124 U/L (ref 98–192)

## 2019-11-10 LAB — VITAMIN B12: Vitamin B-12: 576 pg/mL (ref 180–914)

## 2019-11-10 LAB — FOLATE: Folate: 15.9 ng/mL (ref >5.9)

## 2019-11-10 MED ORDER — SODIUM CHLORIDE 0.9 % IV SOLN
510.0000 mg | Freq: Once | INTRAVENOUS | Status: AC
  Administered 2019-11-10: 510 mg via INTRAVENOUS
  Filled 2019-11-10: qty 510

## 2019-11-10 MED ORDER — SODIUM CHLORIDE 0.9 % IV SOLN
Freq: Once | INTRAVENOUS | Status: AC
  Filled 2019-11-10: qty 250

## 2019-11-10 NOTE — Assessment & Plan Note (Addendum)
#  Macrocytic anemia-multifactorial anemia iron deficient-likely secondary to gastric losses versus others [see below]. Hb 9.3;  proceed with IV iron infusion overall stable.  Reticulocyte count elevated; LDH normal haptoglobin pending. Awaiting capsule study with Dr.Mann [will need to be off plavix].    # Leucopenia 3.2/ neutropenia 1.6/thrombocytopenia-140-s150s-Today 250. Intermittent.? mild Cirrhosis [normal spleen] on June 2020 CT scan /liver disease- stable.  If worse would recommend a bone marrow biopsy.  # CAD s/p stenting- on asprin+ plavix x 6 months [in June]; A.fib- on Coumadin. Stable.   # DISPOSITION:  #  IV ferrahem today; # in 2 weeks- IV ferrahem # follow up as planned in 2 months- MD;labs- cbc;bmp;iron studies/ferritin  possible IV ferrahem- Dr.B

## 2019-11-10 NOTE — Progress Notes (Signed)
[place] OFFICE PROGRESS NOTE  Patient Care Team: Rusty Aus, MD as PCP - General (Internal Medicine)   SUMMARY OF ONCOLOGIC HISTORY:  # MARCH 2017- April 2017- IRON DEFICIENCY ANEMIA ? Etiology s/p IV iron [EGD- ?June 2017- Bleeding gastric "polyp"/ colo- Dr.Gessner]; recommend CAPSULE STUDY [on HOLD sec to ?plavix]  # hx of A.fib- on coumadin-HOLD April 2017/ CAD [Dr.[name]]; Hx of gastric ulcer [Dr.Gessner];   TIA/ CEA [s/p April 2017]; OSA on CPAP.   History of present illness:   [name] patient is here for follow-up of his severe iron deficiency anemia-;also on Coumadin for A. fib is here for follow-up.  Patient denies any blood in stools or black or stools.  Notes to improvement of energy levels after IV iron infusion.  However currently feels tired.  No nausea no vomiting.  He continues to be on Plavix/no capsule study.  Review of Systems  Constitutional: Positive for malaise/fatigue. Negative for chills, diaphoresis and fever.  HENT: Negative for nosebleeds and sore throat.   Eyes: Negative for double vision.  Respiratory: Negative for cough, hemoptysis, sputum production and wheezing.   Cardiovascular: Negative for chest pain, palpitations, orthopnea and leg swelling.  Gastrointestinal: Negative for abdominal pain, blood in stool, constipation, diarrhea, heartburn, melena, nausea and vomiting.  Genitourinary: Negative for dysuria, frequency and urgency.  Musculoskeletal: Negative for back pain and joint pain.  Skin: Negative.  Negative for itching and rash.  Neurological: Negative for dizziness, tingling, focal weakness, weakness and headaches.  Endo/Heme/Allergies: Does not bruise/bleed easily.  Psychiatric/Behavioral: Negative for depression. The patient is not nervous/anxious and does not have insomnia.     Past Medical History:  Diagnosis Date  . Anemia 08/2015   received 2 units rbc one week ago, 3 IV iron infusion -last 1  week.  . Basal cell carcinoma of skin 2012   removed several spots from arms and back  . CAD (coronary artery disease)    a. 01/2010 CABG x 4: LIMA->LAD, VG->D1, VG->OM1, VG->PDA. b. NSTEMI 02/2012 in setting of AF-RVR with cath s/p BMS to RCA (plan for 1 month, possibly up to 3 months of Plavix)  . Carotid disease, bilateral ([place])    a. 03/2011 U/S: 40-59% bilat Carotid dzs;  b. 04/2015 Carotid U/S: 40-59% bilat ICA stenosis; c. 05/2015 CTA Neck: 35% RICA, 45% LICA, mod-marked R vertebral stenosis, mod L vertebral stenosis.  . Chronic kidney disease    small obtusion per pcp. ultrasound done on 08/29/2015  . Diabetes mellitus type II, controlled ([place])    a. Variable CBG 02/2012 - several meds adjusted.  Marland Kitchen Dysrhythmia    intermittent Atrial Fibrillation  . GERD (gastroesophageal reflux disease)   . Heart murmur   . Hyperlipidemia   . Hypertension   . Hypertensive heart disease   . Iron deficiency anemia    "gets infusions q once in awhile" (04/27/2018)  . Macular edema bil   lazer work done previously  . Mild aortic stenosis    a. 03/2011 Echo: EF 55-60%, Mild AS. b. Mild by cath 02/2012; c. 01/2014 Echo: EF 65-70%, Gr 1 DD, mild AS, mildly dil LA.  . Multiple gastric ulcers 2006  . Myocardial infarction ([place]) 02/2012   stents (x1) at time  . Myocardial infarction [facility])    "2nd one was after 02/2012; don't know date" (04/27/2018)  . Obesity   . On home oxygen therapy    "2.5 w/CPAP" (04/27/2018)  . OSA on CPAP  CPAP settings 3 with oxygen 2.5 (04/27/2018)  . PAF (paroxysmal atrial fibrillation) ([place])    a. Newly dx 02/2012 & initiated on Coumadin (spont converted to NSR).  . Shortness of breath dyspnea   . Transfusion history    transfusions -1 month ago -2 units    Past Surgical History:  Procedure Laterality Date  . ANGIOPLASTY    . BASAL CELL CARCINOMA EXCISION     "shoulder, arm X 2"  . CARDIAC CATHETERIZATION  08-09-12  . CARDIAC CATHETERIZATION  08/10/19   [facility] x1 stent  . CATARACT EXTRACTION W/ INTRAOCULAR LENS  IMPLANT, BILATERAL Bilateral 2014/08/09  . COLONOSCOPY WITH PROPOFOL N/A 10/15/2015   Procedure: COLONOSCOPY WITH PROPOFOL;  Surgeon: Gatha Mayer, MD;  Location: [facility] ENDOSCOPY;  Service: Endoscopy;  Laterality: N/A;  . CORONARY ANGIOPLASTY     Dr. Fletcher Anon  . CORONARY ARTERY BYPASS GRAFT  2009/08/09   CABG X3-4; [place], [place], [place],   . CORONARY ATHERECTOMY  04/27/2018  . CORONARY ATHERECTOMY N/A 04/27/2018   Procedure: CORONARY ATHERECTOMY;  Surgeon: Belva Crome, MD;  Location: [place] CV LAB;  Service: Cardiovascular;  Laterality: N/A;  . cyst L kidney  [place]  . ENDARTERECTOMY Left 08/30/2015   Procedure: ENDARTERECTOMY CAROTID;  Surgeon: Katha Cabal, MD;  Location: [facility] ORS;  Service: Vascular;  Laterality: Left;  . ESOPHAGOGASTRODUODENOSCOPY (EGD) WITH PROPOFOL N/A 10/15/2015   Procedure: ESOPHAGOGASTRODUODENOSCOPY (EGD) WITH PROPOFOL;  Surgeon: Gatha Mayer, MD;  Location: [facility] ENDOSCOPY;  Service: Endoscopy;  Laterality: N/A;  . EYE SURGERY Bilateral    "has macular edema cauterized" (04/27/2018)  . INGUINAL HERNIA REPAIR Right   . LEFT HEART CATHETERIZATION WITH CORONARY ANGIOGRAM N/A 03/09/2012   Procedure: LEFT HEART CATHETERIZATION WITH CORONARY ANGIOGRAM;  Surgeon: Wellington Hampshire, MD;  Location: [place] CATH LAB;  Service: Cardiovascular;  Laterality: N/A;  . PERCUTANEOUS CORONARY STENT INTERVENTION (PCI-S)  03/09/2012   Procedure: PERCUTANEOUS CORONARY STENT INTERVENTION (PCI-S);  Surgeon: Wellington Hampshire, MD;  Location: [facility] CATH LAB;  Service: Cardiovascular;;  . RIGHT/LEFT HEART CATH AND CORONARY ANGIOGRAPHY N/A 04/25/2018   Procedure: RIGHT/LEFT HEART CATH AND CORONARY ANGIOGRAPHY;  Surgeon: Wellington Hampshire, MD;  Location: [place] CV LAB;  Service: Cardiovascular;  Laterality: N/A;    Family History  Problem Relation Age of Onset  . COPD Mother        alive 22  . Bladder Cancer Mother   . Heart  attack Father        08/09/2013 deceased  . Lung cancer Maternal Aunt   . Liver cancer Maternal Aunt   . Kidney disease Paternal Uncle   . Cancer Other        all paternal aunts and uncles  . Prostate cancer Neg Hx   . Kidney cancer Neg Hx     SOCIAL HISTORY:   Social History  Substance Use Topics  . Smoking status: Never Smoker   . Smokeless tobacco: Never Used     Comment: tobacco use - no  . Alcohol Use: Yes     Comment: rare drink    ALLERGIES:  has No Known Allergies.  MEDICATIONS:  Current Outpatient Prescriptions  Medication Sig Dispense Refill  . acetaminophen (TYLENOL) 325 MG tablet Take 650 mg by mouth every 6 (six) hours as needed. For pain    . amiodarone (PACERONE) 200 MG tablet Take 0.5 tablets (100 mg total) by mouth daily. (Patient taking differently: Take 100  mg by mouth at bedtime. ) 45 tablet 3  . amLODipine (NORVASC) 5 MG tablet Take one tablet by mouth one time daily 90 tablet 3  . aspirin 81 MG EC tablet Take 81 mg by mouth daily.      Marland Kitchen atorvastatin (LIPITOR) 20 MG tablet Take 1 tablet (20 mg total) by mouth at bedtime. 90 tablet 3  . furosemide (LASIX) 20 MG tablet Take 1 tablet (20 mg total) by mouth as needed. 30 tablet 6  . glimepiride (AMARYL) 4 MG tablet Take 4 mg by mouth 2 (two) times daily.      . insulin glargine (LANTUS) 100 UNIT/ML injection Inject 20 Units into the skin daily.     . insulin lispro (HUMALOG) 100 UNIT/ML injection Inject 8 Units into the skin 2 (two) times daily with a meal. (Patient taking differently: Inject into the skin 3 (three) times daily. Per sliding scale.)    . metFORMIN (GLUCOPHAGE) 500 MG tablet Take 500 mg by mouth 2 (two) times daily with a meal.    . nitroGLYCERIN (NITROSTAT) 0.4 MG SL tablet Place 1 tablet (0.4 mg total) under the tongue every 5 (five) minutes as needed (up to 3 doses). 25 tablet 1  . OXYGEN Inhale 2.5 L/min into the lungs at bedtime. With CPAP    . pantoprazole (PROTONIX) 40 MG tablet Take 1 tablet  (40 mg total) by mouth daily. (Patient taking differently: Take 40 mg by mouth 2 (two) times daily. ) 90 tablet 3  . ramipril (ALTACE) 10 MG capsule Take 1 capsule (10 mg total) by mouth daily. 90 capsule 5  . warfarin (COUMADIN) 5 MG tablet Take as directed by anticoagulation clinic 90 tablet 1  . IRON-VITAMIN C PO Take 1 tablet by mouth daily. Reported on 08/27/2015    . Omega-3 Fatty Acids (FISH OIL) 1200 MG CAPS Take 1 capsule by mouth daily. Reported on 08/27/2015     No current facility-administered medications for this visit.    PHYSICAL EXAMINATION:   BP 158/71 mmHg  Pulse 51  Temp(Src) 96.1 F (35.6 C)  Resp 18  Wt 192 lb 0.3 oz (87.1 kg)  Filed Weights   08/27/15 1015  Weight: 192 lb 0.3 oz (87.1 kg)    Physical Exam Constitutional:      Comments: Patient alone.  HENT:     Head: Normocephalic and atraumatic.     Mouth/Throat:     Pharynx: No oropharyngeal exudate.  Eyes:     Pupils: Pupils are equal, round, and reactive to light.  Cardiovascular:     Rate and Rhythm: Normal rate and regular rhythm.  Pulmonary:     Effort: No respiratory distress.     Breath sounds: No wheezing.  Abdominal:     General: Bowel sounds are normal. There is no distension.     Palpations: Abdomen is soft. There is no mass.     Tenderness: There is no abdominal tenderness. There is no guarding or rebound.  Musculoskeletal:        General: No tenderness. Normal range of motion.     Cervical back: Normal range of motion and neck supple.  Skin:    General: Skin is warm.  Neurological:     Mental Status: He is alert and oriented to person, place, and time.  Psychiatric:        Mood and Affect: Affect normal.      LABORATORY DATA:  I have reviewed the data as listed    Component  Value Date/Time   NA 139 07/25/2015 1138   NA 139 01/22/2013 0348   K 4.1 07/25/2015 1138   K 4.2 01/22/2013 0348   CL 103 07/25/2015 1138   CL 106 01/22/2013 0348   CO2 27 07/25/2015 1138   CO2 28  01/22/2013 0348   GLUCOSE 216* 07/25/2015 1138   GLUCOSE 158* 01/22/2013 0348   BUN 14 07/25/2015 1138   BUN 16 01/22/2013 0348   CREATININE 1.03 07/25/2015 1138   CREATININE 1.02 01/22/2013 0348   CALCIUM 9.1 07/25/2015 1138   CALCIUM 8.6 01/22/2013 0348   PROT 7.2 06/25/2015 1045   PROT 7.5 01/21/2013 0209   ALBUMIN 4.1 06/25/2015 1045   ALBUMIN 4.3 01/21/2013 0209   AST 30 06/25/2015 1045   AST 28 01/21/2013 0209   ALT 33 06/25/2015 1045   ALT 36 01/21/2013 0209   ALKPHOS 104 06/25/2015 1045   ALKPHOS 143* 01/21/2013 0209   BILITOT 0.4 06/25/2015 1045   BILITOT 0.4 01/21/2013 0209   GFRNONAA >60 07/25/2015 1138   GFRNONAA >60 01/22/2013 0348   GFRAA >60 07/25/2015 1138   GFRAA >60 01/22/2013 0348    No results found for: SPEP, UPEP  Lab Results  Component Value Date   WBC 4.5 08/22/2015   NEUTROABS 2.5 08/22/2015   HGB 8.0 Repeated and verified X2.* 08/22/2015   HCT 26.2 Repeated and verified X2.* 08/22/2015   MCV 67.4 Repeated and verified X2.* 08/22/2015   PLT 264.0 08/22/2015      Chemistry      Component Value Date/Time   NA 139 07/25/2015 1138   NA 139 01/22/2013 0348   K 4.1 07/25/2015 1138   K 4.2 01/22/2013 0348   CL 103 07/25/2015 1138   CL 106 01/22/2013 0348   CO2 27 07/25/2015 1138   CO2 28 01/22/2013 0348   BUN 14 07/25/2015 1138   BUN 16 01/22/2013 0348   CREATININE 1.03 07/25/2015 1138   CREATININE 1.02 01/22/2013 0348      Component Value Date/Time   CALCIUM 9.1 07/25/2015 1138   CALCIUM 8.6 01/22/2013 0348   ALKPHOS 104 06/25/2015 1045   ALKPHOS 143* 01/21/2013 0209   AST 30 06/25/2015 1045   AST 28 01/21/2013 0209   ALT 33 06/25/2015 1045   ALT 36 01/21/2013 0209   BILITOT 0.4 06/25/2015 1045   BILITOT 0.4 01/21/2013 0209        ASSESSMENT & PLAN:   Iron deficiency anemia due to chronic blood loss #Macrocytic anemia-multifactorial anemia iron deficient-likely secondary to gastric losses versus others [see below]. Hb 9.3;   proceed with IV iron infusion overall stable.  Reticulocyte count elevated; LDH normal haptoglobin pending. Awaiting capsule study with Dr.Mann [will need to be off plavix].    # Leucopenia 3.2/ neutropenia 1.6/thrombocytopenia-140-s150s-Today 250. Intermittent.? mild Cirrhosis [normal spleen] on June 2020 CT scan /liver disease- stable.  If worse would recommend a bone marrow biopsy.  # CAD s/p stenting- on asprin+ plavix x 6 months [in June]; A.fib- on Coumadin. Stable.   # DISPOSITION:  #  IV ferrahem today; # in 2 weeks- IV ferrahem # follow up as planned in 2 months- MD;labs- cbc;bmp;iron studies/ferritin  possible IV ferrahem- Dr.B

## 2019-11-11 LAB — HAPTOGLOBIN: Haptoglobin: 143 mg/dL (ref 34–355)

## 2019-11-14 DIAGNOSIS — E1165 Type 2 diabetes mellitus with hyperglycemia: Secondary | ICD-10-CM | POA: Diagnosis not present

## 2019-11-14 DIAGNOSIS — E1139 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: Secondary | ICD-10-CM | POA: Diagnosis not present

## 2019-11-14 DIAGNOSIS — G4733 Obstructive sleep apnea (adult) (pediatric): Secondary | ICD-10-CM | POA: Diagnosis not present

## 2019-11-14 LAB — COPPER, SERUM: Copper: 83 ug/dL (ref 69–132)

## 2019-11-14 LAB — ZINC: Zinc: 65 ug/dL (ref 44–115)

## 2019-11-22 DIAGNOSIS — G4733 Obstructive sleep apnea (adult) (pediatric): Secondary | ICD-10-CM | POA: Diagnosis not present

## 2019-11-24 ENCOUNTER — Ambulatory Visit: Payer: PPO

## 2019-11-29 ENCOUNTER — Inpatient Hospital Stay: Payer: PPO | Attending: Internal Medicine

## 2019-11-29 ENCOUNTER — Other Ambulatory Visit: Payer: Self-pay

## 2019-11-29 VITALS — BP 144/64 | HR 50 | Temp 96.5°F | Resp 18

## 2019-11-29 DIAGNOSIS — Z79899 Other long term (current) drug therapy: Secondary | ICD-10-CM | POA: Diagnosis not present

## 2019-11-29 DIAGNOSIS — D500 Iron deficiency anemia secondary to blood loss (chronic): Secondary | ICD-10-CM

## 2019-11-29 MED ORDER — SODIUM CHLORIDE 0.9 % IV SOLN
510.0000 mg | Freq: Once | INTRAVENOUS | Status: AC
  Administered 2019-11-29: 510 mg via INTRAVENOUS
  Filled 2019-11-29: qty 510

## 2019-11-29 MED ORDER — SODIUM CHLORIDE 0.9 % IV SOLN
Freq: Once | INTRAVENOUS | Status: AC
  Filled 2019-11-29: qty 250

## 2019-11-29 NOTE — Progress Notes (Signed)
Pt tolerated infusion well. Pt declines to stay full 30 minute post observation. No s/s of distress or reaction noted. Pt and VS stable at discharge.

## 2019-11-30 ENCOUNTER — Ambulatory Visit: Payer: PPO | Admitting: Cardiovascular Disease

## 2019-12-20 ENCOUNTER — Other Ambulatory Visit: Payer: Self-pay

## 2019-12-20 ENCOUNTER — Ambulatory Visit (INDEPENDENT_AMBULATORY_CARE_PROVIDER_SITE_OTHER): Payer: PPO

## 2019-12-20 DIAGNOSIS — I4891 Unspecified atrial fibrillation: Secondary | ICD-10-CM | POA: Diagnosis not present

## 2019-12-20 DIAGNOSIS — Z7901 Long term (current) use of anticoagulants: Secondary | ICD-10-CM | POA: Diagnosis not present

## 2019-12-20 LAB — POCT INR: INR: 2.7 (ref 2.0–3.0)

## 2019-12-20 NOTE — Patient Instructions (Signed)
Please continue dosage of 1/2 tablet daily except 1 tablet on [place].  Recheck INR in 6 weeks.

## 2019-12-25 ENCOUNTER — Other Ambulatory Visit: Payer: Self-pay

## 2019-12-25 ENCOUNTER — Emergency Department: Payer: PPO

## 2019-12-25 ENCOUNTER — Inpatient Hospital Stay
Admission: EM | Admit: 2019-12-25 | Discharge: 2019-12-28 | DRG: 281 | Disposition: A | Discharge status: Home / Self Care | Payer: PPO | Attending: Internal Medicine | Admitting: Internal Medicine

## 2019-12-25 DIAGNOSIS — Z801 Family history of malignant neoplasm of trachea, bronchus and lung: Secondary | ICD-10-CM

## 2019-12-25 DIAGNOSIS — I517 Cardiomegaly: Secondary | ICD-10-CM | POA: Diagnosis not present

## 2019-12-25 DIAGNOSIS — Z794 Long term (current) use of insulin: Secondary | ICD-10-CM

## 2019-12-25 DIAGNOSIS — K219 Gastro-esophageal reflux disease without esophagitis: Secondary | ICD-10-CM | POA: Diagnosis not present

## 2019-12-25 DIAGNOSIS — Z800 Family history of malignant neoplasm of digestive organs: Secondary | ICD-10-CM

## 2019-12-25 DIAGNOSIS — I272 Pulmonary hypertension, unspecified: Secondary | ICD-10-CM | POA: Diagnosis present

## 2019-12-25 DIAGNOSIS — I25118 Atherosclerotic heart disease of native coronary artery with other forms of angina pectoris: Secondary | ICD-10-CM | POA: Diagnosis present

## 2019-12-25 DIAGNOSIS — I248 Other forms of acute ischemic heart disease: Secondary | ICD-10-CM | POA: Diagnosis not present

## 2019-12-25 DIAGNOSIS — N1831 Chronic kidney disease, stage 3a: Secondary | ICD-10-CM | POA: Diagnosis present

## 2019-12-25 DIAGNOSIS — I25810 Atherosclerosis of coronary artery bypass graft(s) without angina pectoris: Secondary | ICD-10-CM | POA: Diagnosis present

## 2019-12-25 DIAGNOSIS — R069 Unspecified abnormalities of breathing: Secondary | ICD-10-CM | POA: Diagnosis not present

## 2019-12-25 DIAGNOSIS — Z8052 Family history of malignant neoplasm of bladder: Secondary | ICD-10-CM

## 2019-12-25 DIAGNOSIS — Z9119 Patient's noncompliance with other medical treatment and regimen: Secondary | ICD-10-CM | POA: Diagnosis not present

## 2019-12-25 DIAGNOSIS — Z7902 Long term (current) use of antithrombotics/antiplatelets: Secondary | ICD-10-CM | POA: Diagnosis not present

## 2019-12-25 DIAGNOSIS — Z20822 Contact with and (suspected) exposure to covid-19: Secondary | ICD-10-CM | POA: Diagnosis not present

## 2019-12-25 DIAGNOSIS — Z7901 Long term (current) use of anticoagulants: Secondary | ICD-10-CM

## 2019-12-25 DIAGNOSIS — Z85828 Personal history of other malignant neoplasm of skin: Secondary | ICD-10-CM

## 2019-12-25 DIAGNOSIS — Z951 Presence of aortocoronary bypass graft: Secondary | ICD-10-CM

## 2019-12-25 DIAGNOSIS — Z6829 Body mass index (BMI) 29.0-29.9, adult: Secondary | ICD-10-CM

## 2019-12-25 DIAGNOSIS — E1139 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: Secondary | ICD-10-CM

## 2019-12-25 DIAGNOSIS — I4819 Other persistent atrial fibrillation: Principal | ICD-10-CM | POA: Diagnosis present

## 2019-12-25 DIAGNOSIS — Z9981 Dependence on supplemental oxygen: Secondary | ICD-10-CM

## 2019-12-25 DIAGNOSIS — E669 Obesity, unspecified: Secondary | ICD-10-CM | POA: Diagnosis present

## 2019-12-25 DIAGNOSIS — Z955 Presence of coronary angioplasty implant and graft: Secondary | ICD-10-CM

## 2019-12-25 DIAGNOSIS — R079 Chest pain, unspecified: Secondary | ICD-10-CM | POA: Diagnosis not present

## 2019-12-25 DIAGNOSIS — Z79899 Other long term (current) drug therapy: Secondary | ICD-10-CM

## 2019-12-25 DIAGNOSIS — R001 Bradycardia, unspecified: Secondary | ICD-10-CM | POA: Diagnosis not present

## 2019-12-25 DIAGNOSIS — I214 Non-ST elevation (NSTEMI) myocardial infarction: Secondary | ICD-10-CM | POA: Diagnosis present

## 2019-12-25 DIAGNOSIS — Z8711 Personal history of peptic ulcer disease: Secondary | ICD-10-CM

## 2019-12-25 DIAGNOSIS — E1122 Type 2 diabetes mellitus with diabetic chronic kidney disease: Secondary | ICD-10-CM | POA: Diagnosis present

## 2019-12-25 DIAGNOSIS — I350 Nonrheumatic aortic (valve) stenosis: Secondary | ICD-10-CM | POA: Diagnosis not present

## 2019-12-25 DIAGNOSIS — I252 Old myocardial infarction: Secondary | ICD-10-CM | POA: Diagnosis not present

## 2019-12-25 DIAGNOSIS — I4891 Unspecified atrial fibrillation: Secondary | ICD-10-CM | POA: Diagnosis present

## 2019-12-25 DIAGNOSIS — R000 Tachycardia, unspecified: Secondary | ICD-10-CM | POA: Diagnosis present

## 2019-12-25 DIAGNOSIS — E785 Hyperlipidemia, unspecified: Secondary | ICD-10-CM | POA: Diagnosis not present

## 2019-12-25 DIAGNOSIS — J9611 Chronic respiratory failure with hypoxia: Secondary | ICD-10-CM | POA: Diagnosis not present

## 2019-12-25 DIAGNOSIS — D509 Iron deficiency anemia, unspecified: Secondary | ICD-10-CM | POA: Diagnosis not present

## 2019-12-25 DIAGNOSIS — G4733 Obstructive sleep apnea (adult) (pediatric): Secondary | ICD-10-CM | POA: Diagnosis present

## 2019-12-25 DIAGNOSIS — I5032 Chronic diastolic (congestive) heart failure: Secondary | ICD-10-CM | POA: Diagnosis not present

## 2019-12-25 DIAGNOSIS — E1165 Type 2 diabetes mellitus with hyperglycemia: Secondary | ICD-10-CM | POA: Diagnosis not present

## 2019-12-25 DIAGNOSIS — I10 Essential (primary) hypertension: Secondary | ICD-10-CM | POA: Diagnosis present

## 2019-12-25 DIAGNOSIS — I2510 Atherosclerotic heart disease of native coronary artery without angina pectoris: Secondary | ICD-10-CM | POA: Diagnosis not present

## 2019-12-25 DIAGNOSIS — Z825 Family history of asthma and other chronic lower respiratory diseases: Secondary | ICD-10-CM

## 2019-12-25 DIAGNOSIS — I130 Hypertensive heart and chronic kidney disease with heart failure and stage 1 through stage 4 chronic kidney disease, or unspecified chronic kidney disease: Secondary | ICD-10-CM | POA: Diagnosis present

## 2019-12-25 DIAGNOSIS — J811 Chronic pulmonary edema: Secondary | ICD-10-CM | POA: Diagnosis not present

## 2019-12-25 DIAGNOSIS — J90 Pleural effusion, not elsewhere classified: Secondary | ICD-10-CM | POA: Diagnosis not present

## 2019-12-25 DIAGNOSIS — R197 Diarrhea, unspecified: Secondary | ICD-10-CM | POA: Diagnosis present

## 2019-12-25 DIAGNOSIS — E119 Type 2 diabetes mellitus without complications: Secondary | ICD-10-CM

## 2019-12-25 DIAGNOSIS — R0789 Other chest pain: Secondary | ICD-10-CM | POA: Diagnosis not present

## 2019-12-25 DIAGNOSIS — E11311 Type 2 diabetes mellitus with unspecified diabetic retinopathy with macular edema: Secondary | ICD-10-CM | POA: Diagnosis present

## 2019-12-25 LAB — CBC
HCT: 33 % — ABNORMAL LOW (ref 39.0–52.0)
Hemoglobin: 10.5 g/dL — ABNORMAL LOW (ref 13.0–17.0)
MCH: 33.1 pg (ref 26.0–34.0)
MCHC: 31.8 g/dL (ref 30.0–36.0)
MCV: 104.1 fL — ABNORMAL HIGH (ref 80.0–100.0)
Platelets: 182 10*3/uL (ref 150–400)
RBC: 3.17 MIL/uL — ABNORMAL LOW (ref 4.22–5.81)
RDW: 14.2 % (ref 11.5–15.5)
WBC: 4.1 10*3/uL (ref 4.0–10.5)
nRBC: 0 % (ref 0.0–0.2)

## 2019-12-25 LAB — PROTIME-INR
INR: 1.8 — ABNORMAL HIGH (ref 0.8–1.2)
Prothrombin Time: 20.2 seconds — ABNORMAL HIGH (ref 11.4–15.2)

## 2019-12-25 MED ORDER — AMIODARONE LOAD VIA INFUSION
150.0000 mg | Freq: Once | INTRAVENOUS | Status: DC
Start: 2019-12-26 — End: 2019-12-25

## 2019-12-25 MED ORDER — DILTIAZEM LOAD VIA INFUSION
15.0000 mg | Freq: Once | INTRAVENOUS | Status: DC
  Administered 2019-12-25: 15 mg via INTRAVENOUS
  Filled 2019-12-25: qty 15

## 2019-12-25 MED ORDER — SODIUM CHLORIDE 0.9 % IV BOLUS: 250.0000 mL | Freq: Once | INTRAVENOUS | Status: DC

## 2019-12-25 MED ORDER — AMIODARONE HCL IN DEXTROSE 360-4.14 MG/200ML-% IV SOLN: 30.0000 mg/h | INTRAVENOUS | Status: DC

## 2019-12-25 MED ORDER — AMIODARONE HCL IN DEXTROSE 360-4.14 MG/200ML-% IV SOLN: 60.0000 mg/h | INTRAVENOUS | Status: DC

## 2019-12-25 MED ORDER — DILTIAZEM HCL-DEXTROSE 125-5 MG/125ML-% IV SOLN (PREMIX): 5.0000 mg/h | INTRAVENOUS | Status: DC

## 2019-12-25 MED ORDER — DILTIAZEM HCL-DEXTROSE 125-5 MG/125ML-% IV SOLN (PREMIX)
5.0000 mg/h | INTRAVENOUS | Status: DC
  Administered 2019-12-25: 5 mg/h via INTRAVENOUS
  Filled 2019-12-25: qty 125

## 2019-12-25 NOTE — ED Triage Notes (Signed)
Pt arrives to ED from home via [facility] with c/c of chest pain x40 minuites and Afib. Pt had heart attack approx 4 months ago. EMS reports vitals: 170/99, p129 Afib noted, 100% on room air. Pt given 4 baby aspirin, 1 spray of nitro, placed 20G in left AC. Upon arrival, pt A&Ox4, NAD, no respiratory Sx noted.

## 2019-12-25 NOTE — ED Provider Notes (Addendum)
[facility] Emergency Department Provider Note    First MD Initiated Contact with Patient 12/25/19 08-16-24     (approximate)  I have reviewed the triage vital signs and the nursing notes.   HISTORY  Chief Complaint Chest Pain    HPI Seth Hardy is a 74 y.o. male with an extensive cardiac history status post recent stent post CABG presents to the ER for evaluation of chest discomfort associated with heart palpitations and diaphoresis.  States he had similar symptoms prior to his previous stenting.  States he does have A. fib and took a full dose of amiodarone before coming to the ER tonight.  States he is currently having some mild discomfort.  Is on Coumadin as well as Plavix.  Is otherwise been compliant with his medications.    Past Medical History:  Diagnosis Date  . Anemia 08/2015   received 2 units rbc one week ago, 3 IV iron infusion -last 1 week.  . Basal cell carcinoma of skin 2010/08/17   removed several spots from arms and back  . CAD (coronary artery disease)    a. 01/2010 CABG x 4: LIMA->LAD, VG->D1, VG->OM1, VG->PDA. b. NSTEMI 02/2012 in setting of AF-RVR with cath s/p BMS to RCA (plan for 1 month, possibly up to 3 months of Plavix)  . Carotid disease, bilateral ([place])    a. 03/2011 U/S: 40-59% bilat Carotid dzs;  b. 04/2015 Carotid U/S: 40-59% bilat ICA stenosis; c. 05/2015 CTA Neck: 73% RICA, 53% LICA, mod-marked R vertebral stenosis, mod L vertebral stenosis.  . Chronic kidney disease    small obtusion per pcp. ultrasound done on 08/29/2015  . Diabetes mellitus type II, controlled ([place])    a. Variable CBG 02/2012 - several meds adjusted.  Marland Kitchen Dysrhythmia    intermittent Atrial Fibrillation  . GERD (gastroesophageal reflux disease)   . Heart murmur   . Hyperlipidemia   . Hypertension   . Hypertensive heart disease   . Iron deficiency anemia    "gets infusions q once in awhile" (04/27/2018)  . Macular edema bil   lazer work done previously  .  Mild aortic stenosis    a. 03/2011 Echo: EF 55-60%, Mild AS. b. Mild by cath 02/2012; c. 01/2014 Echo: EF 65-70%, Gr 1 DD, mild AS, mildly dil LA.  . Multiple gastric ulcers 16-Aug-2004  . Myocardial infarction ([name]) 02/2012   stents (x1) at time  . Myocardial infarction [facility])    "2nd one was after 02/2012; don't know date" (04/27/2018)  . Obesity   . On home oxygen therapy    "2.5 w/CPAP" (04/27/2018)  . OSA on CPAP    CPAP settings 3 with oxygen 2.5 (04/27/2018)  . PAF (paroxysmal atrial fibrillation) ([name])    a. Newly dx 02/2012 & initiated on Coumadin (spont converted to NSR).  . Shortness of breath dyspnea   . Transfusion history    transfusions -1 month ago -2 units   Family History  Problem Relation Age of Onset  . COPD Mother        alive 29  . Bladder Cancer Mother   . Heart attack Father        2013/08/16 deceased  . Lung cancer Maternal Aunt   . Liver cancer Maternal Aunt   . Kidney disease Paternal Uncle   . Cancer Other        all paternal aunts and uncles  . Prostate cancer Neg Hx   . Kidney cancer Neg Hx  Past Surgical History:  Procedure Laterality Date  . ANGIOPLASTY    . BASAL CELL CARCINOMA EXCISION     "shoulder, arm X 2"  . CARDIAC CATHETERIZATION  2014  . CARDIAC CATHETERIZATION  2021   [facility] x1 stent  . CATARACT EXTRACTION W/ INTRAOCULAR LENS  IMPLANT, BILATERAL Bilateral 2016  . COLONOSCOPY WITH PROPOFOL N/A 10/15/2015   Procedure: COLONOSCOPY WITH PROPOFOL;  Surgeon: Gatha Mayer, MD;  Location: [facility] END[facility]COPY;  Service: Endoscopy;  Laterality: N/A;  . CORONARY ANGIOPLASTY     Dr. Fletcher Anon  . CORONARY ARTERY BYPASS GRAFT  2011   CABG X3-4; [place], [place], [place],   . CORONARY ATHERECTOMY  04/27/2018  . CORONARY ATHERECTOMY N/A 04/27/2018   Procedure: CORONARY ATHERECTOMY;  Surgeon: Belva Crome, MD;  Location: [place] CV LAB;  Service: Cardiovascular;  Laterality: N/A;  . cyst L kidney  [place]  . ENDARTERECTOMY Left  08/30/2015   Procedure: ENDARTERECTOMY CAROTID;  Surgeon: Katha Cabal, MD;  Location: [facility] ORS;  Service: Vascular;  Laterality: Left;  . ESOPHAGOGASTRODUODENOSCOPY (EGD) WITH PROPOFOL N/A 10/15/2015   Procedure: ESOPHAGOGASTRODUODENOSCOPY (EGD) WITH PROPOFOL;  Surgeon: Gatha Mayer, MD;  Location: [facility];  Service: Endoscopy;  Laterality: N/A;  . EYE SURGERY Bilateral    "has macular edema cauterized" (04/27/2018)  . INGUINAL HERNIA REPAIR Right   . LEFT HEART CATHETERIZATION WITH CORONARY ANGIOGRAM N/A 03/09/2012   Procedure: LEFT HEART CATHETERIZATION WITH CORONARY ANGIOGRAM;  Surgeon: Wellington Hampshire, MD;  Location: [place] CATH LAB;  Service: Cardiovascular;  Laterality: N/A;  . PERCUTANEOUS CORONARY STENT INTERVENTION (PCI-S)  03/09/2012   Procedure: PERCUTANEOUS CORONARY STENT INTERVENTION (PCI-S);  Surgeon: Wellington Hampshire, MD;  Location: [facility] C[facility] LAB;  Service: Cardiovascular;;  . RIGHT/LEFT HEART CATH AND CORONARY ANGIOGRAPHY N/A 04/25/2018   Procedure: RIGHT/LEFT HEART CATH AND CORONARY ANGIOGRAPHY;  Surgeon: Wellington Hampshire, MD;  Location: [place] CV LAB;  Service: Cardiovascular;  Laterality: N/A;   Patient Active Problem List   Diagnosis Date Noted  . Rapid atrial fibrillation ([name]) 12/26/2019  . CAD (coronary artery disease), native coronary artery 04/27/2018  . Effort angina ([facility])   . Benign essential hypertension 02/11/2016  . Microcytic anemia   . Gastric polyp   . BP (high blood pressure) 09/25/2015  . Aortic heart valve narrowing 09/23/2015  . Carotid artery stenosis with cerebral infarction ([place]) 08/30/2015  . Iron deficiency anemia due to chronic blood loss 08/21/2015  . B12 deficiency 08/19/2015  . Carotid disease, bilateral ([name])   . CAD (coronary artery disease)   . Mild aortic stenosis   . Hypertensive heart disease   . Diabetes mellitus type II, controlled ([name])   . Hyperlipidemia 03/14/2015  . Carotid stenosis, asymptomatic, left 09/13/2014    . 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 History of atrial fibrillation 01/04/2014  . Obstructive apnea 01/04/2014  . Atrial fibrillation ([place]) 03/23/2012  . Long term (current) use of anticoagulants 03/14/2012  . Non-STEMI (non-ST elevated myocardial infarction) ([place]) 03/09/2012  . Carotid stenosis 05/28/2010  . CAD, ARTERY BYPASS GRAFT 04/07/2010  . CAD, NATIVE VESSEL 03/13/2009  . Aortic valve stenosis, mild 11/01/2008      Prior to Admission medications   Medication Sig Start Date End Date Taking? Authorizing Provider  acetaminophen (TYLENOL) 500 MG tablet Take 1,000 mg by mouth every 6 (six) hours as needed for moderate pain or headache.   Yes [provider]  ALPRAZolam (XANAX) 0.25 MG tablet Take 0.25 mg by mouth daily as needed for anxiety.   Yes [provider]  amiodarone (PACERONE) 200 MG tablet TAKE 1/2 TABLET AT BEDTIME 01/07/18  Yes Wellington Hampshire, MD  amLODipine (NORVASC) 5 MG tablet Take one tablet by mouth one time daily 07/25/14  Yes Wellington Hampshire, MD  clopidogrel (PLAVIX) 75 MG tablet TAKE 1 TABLET BY MOUTH DAILY 07/19/19  Yes Wellington Hampshire, MD  Cyanocobalamin (B-12) 1000 MCG CAPS Take 1,000 mcg by mouth every evening.    Yes [provider]  Ferrous Gluconate-C-Folic Acid (IRON-C PO) Take 1 tablet by mouth daily.   Yes [provider]  furosemide (LASIX) 20 MG tablet Take 1 tablet (20 mg total) by mouth daily. Patient taking differently: Take 20 mg by mouth daily as needed.  04/25/18  Yes Wellington Hampshire, MD  glimepiride (AMARYL) 4 MG tablet Take 4 mg by mouth daily.    Yes [provider]  insulin glargine (LANTUS) 100 UNIT/ML injection Inject 15 Units into the skin daily.    Yes [provider]  NOVOLOG FLEXPEN 100 UNIT/ML FlexPen Inject 6-36 Units into the skin 2 (two) times daily with a meal.  05/26/17  Yes [provider]  omega-3 acid ethyl  esters (LOVAZA) 1 g capsule Take 1 capsule (1 g total) by mouth daily. 04/28/18  Yes Kathyrn Drown D, NP  Omega-3 Fatty Acids (FISH OIL) 1000 MG CAPS Take 1,000 mg by mouth every evening.   Yes [provider]  OXYGEN Inhale 2.5 L/min into the lungs at bedtime. With CPAP   Yes [provider]  pantoprazole (PROTONIX) 40 MG tablet Take 1 tablet (40 mg total) by mouth daily. 06/11/14  Yes Wellington Hampshire, MD  Polyethylene Glycol 400 (BLINK TEARS OP) Place 1 drop into both eyes daily as needed (dry eyes).   Yes [provider]  ramipril (ALTACE) 10 MG capsule Take 10 mg by mouth daily.  09/17/15  Yes [provider]  rosuvastatin (CRESTOR) 40 MG tablet TAKE ONE TABLET BY MOUTH EVERY DAY 06/05/19  Yes Wellington Hampshire, MD  sucralfate (CARAFATE) 1 g tablet Take 1 g by mouth 2 (two) times daily. 08/23/19 08/22/20 Yes [provider]  vitamin C (ASCORBIC ACID) 500 MG tablet Take 500 mg by mouth every evening.   Yes [provider]  warfarin (COUMADIN) 5 MG tablet Take 2.5-5 mg by mouth See admin instructions. Take 5 mg at night on Sun, Mon, Wed, Thur, and Fri.  Take 2.5 mg at night on Tues and Sat 07/17/16  Yes [provider]  nitroGLYCERIN (NITROSTAT) 0.4 MG SL tablet PLACE 1 TABLET UNDER THE TONGUE EVERY 5 MINUTES FOR UP TO 3 DOSES AS NEEDED FOR CHEST PAINS, IF NO RELIEF CALL 911 12/15/18   Wellington Hampshire, MD  tadalafil (CIALIS) 10 MG tablet Take 10 mg by mouth as needed. 08/23/19 09/22/19  [provider]  zinc gluconate 50 MG tablet Take 50 mg by mouth daily. Patient not taking: Reported on 12/26/2019    [provider]    Allergies Patient has no known allergies.    Social History Social History   Tobacco Use  . Smoking status: Never Smoker  . Smokeless tobacco: Never Used  . Tobacco comment: tobacco use - no.no passive smoke in home  Vaping Use  . Vaping Use: Never used  Substance Use Topics  . Alcohol use: Yes  Comment: 04/27/2018 "couple drinks/year; if that"  . Drug use: Never    Review of Systems Patient denies headaches, rhinorrhea, blurry vision, numbness, shortness of breath, chest pain, edema, cough, abdominal pain, nausea, vomiting, diarrhea, dysuria, fevers, rashes or hallucinations unless otherwise stated above in HPI. ____________________________________________   PHYSICAL EXAM:  VITAL SIGNS: Vitals:   12/26/19 0025 12/26/19 0030  BP:  (!) 126/49  Pulse:  (!) 52  Resp:  16  Temp: 98.6 F (37 C)   SpO2:  94%    Constitutional: Alert and oriented.  Eyes: Conjunctivae are normal.  Head: Atraumatic. Nose: No congestion/rhinnorhea. Mouth/Throat: Mucous membranes are moist.   Neck: No stridor. Painless ROM.  Cardiovascular: Normal rate, regular rhythm. Grossly normal heart sounds.  Good peripheral circulation. Respiratory: Normal respiratory effort.  No retractions. Lungs CTAB. Gastrointestinal: Soft and nontender. No distention. No abdominal bruits. No CVA tenderness. Genitourinary:  Musculoskeletal: No lower extremity tenderness nor edema.  No joint effusions. Neurologic:  Normal speech and language. No gross focal neurologic deficits are appreciated. No facial droop Skin:  Skin is warm, dry and intact. No rash noted. Psychiatric: Mood and affect are normal. Speech and behavior are normal.  ____________________________________________   LABS (all labs ordered are listed, but only abnormal results are displayed)  Results for orders placed or performed during the hospital encounter of 12/25/19 (from the past 24 hour(s))  Basic metabolic panel     Status: Abnormal   Collection Time: 12/25/19 11:23 PM  Result Value Ref Range   Sodium 140 135 - 145 mmol/L   Potassium 3.7 3.5 - 5.1 mmol/L   Chloride 106 98 - 111 mmol/L   CO2 23 22 - 32 mmol/L   Glucose, Bld 258 (H) 70 - 99 mg/dL   BUN 17 8 - 23 mg/dL   Creatinine, Ser 1.33 (H) 0.61 - 1.24 mg/dL   Calcium 8.7 (L) 8.9  - 10.3 mg/dL   GFR calc non Af Amer 53 (L) >60 mL/min   GFR calc Af Amer >60 >60 mL/min   Anion gap 11 5 - 15  CBC     Status: Abnormal   Collection Time: 12/25/19 11:23 PM  Result Value Ref Range   WBC 4.1 4.0 - 10.5 K/uL   RBC 3.17 (L) 4.22 - 5.81 MIL/uL   Hemoglobin 10.5 (L) 13.0 - 17.0 g/dL   HCT 33.0 (L) 39 - 52 %   MCV 104.1 (H) 80.0 - 100.0 fL   MCH 33.1 26.0 - 34.0 pg   MCHC 31.8 30.0 - 36.0 g/dL   RDW 14.2 11.5 - 15.5 %   Platelets 182 150 - 400 K/uL   nRBC 0.0 0.0 - 0.2 %  Troponin I (High Sensitivity)     Status: None   Collection Time: 12/25/19 11:23 PM  Result Value Ref Range   Troponin I (High Sensitivity) 15 <18 ng/L  Protime-INR (order if Patient is taking Coumadin / Warfarin)     Status: Abnormal   Collection Time: 12/25/19 11:23 PM  Result Value Ref Range   Prothrombin Time 20.2 (H) 11.4 - 15.2 seconds   INR 1.8 (H) 0.8 - 1.2  Troponin I (High Sensitivity)     Status: Abnormal   Collection Time: 12/26/19  1:26 AM  Result Value Ref Range   Troponin I (High Sensitivity) 102 (HH) <18 ng/L   ____________________________________________  EKG My review and personal interpretation at Time: 23:18   Indication: afib  Rate: 130  Rhythm: afib Axis: normal Other: nonspecific  st and t wave abn likely rate dependent ____________________________________________  RADIOLOGY  I personally reviewed all radiographic images ordered to evaluate for the above acute complaints and reviewed radiology reports and findings.  These findings were personally discussed with the patient.  Please see medical record for radiology report.  ____________________________________________   PROCEDURES  Procedure(s) performed:  .1-3 Lead EKG Interpretation Performed by: Merlyn Lot, MD Authorized by: Merlyn Lot, MD     Interpretation: abnormal     ECG rate:  80-130   ECG rate assessment: tachycardic     Rhythm: atrial fibrillation     Ectopy: none     Conduction:  normal    .Critical Care Performed by: Merlyn Lot, MD Authorized by: Merlyn Lot, MD   Critical care provider statement:    Critical care time (minutes):  35   Critical care time was exclusive of:  Separately billable procedures and treating other patients   Critical care was necessary to treat or prevent imminent or life-threatening deterioration of the following conditions:  Circulatory failure   Critical care was time spent personally by me on the following activities:  Development of treatment plan with patient or surrogate, discussions with consultants, evaluation of patient's response to treatment, examination of patient, obtaining history from patient or surrogate, ordering and performing treatments and interventions, ordering and review of laboratory studies, ordering and review of radiographic studies, pulse oximetry, re-evaluation of patient's condition and review of old charts      Critical Care performed: yes ____________________________________________   INITIAL IMPRESSION / ASSESSMENT AND PLAN / ED COURSE  Pertinent labs & imaging results that were available during my care of the patient were reviewed by me and considered in my medical decision making (see chart for details).   DDX: ACS, pericarditis, esophagitis, boerhaaves, pe, dissection, pna, bronchitis, costochondritis   TIRRELL BUCHBERGER is a 74 y.o. who presents to the ED with symptoms as described above.  Patient with extensive cardiac history.  Clinically appears nontoxic but is in A. fib with RVR and I suspect that is causing most of his discomfort symptoms.  EKG with some nonspecific ST changes likely rate dependent.  States he already took additional dose of amiodarone this evening and still having fast heart rates therefore will try different agent in the form of IV Cardizem.  The patient will be placed on continuous pulse oximetry and telemetry for monitoring.  Laboratory evaluation will be sent to  evaluate for the above complaints.     Clinical Course as of Dec 26 231  Tue Dec 26, 2019  0006 Initial troponin negative.  Patient's heart rate bouncing between 60s and 1 teens.  Given his history and symptoms will discuss with hospitalist for admission for further medical management.   [PR]    Clinical Course User Index [PR] Merlyn Lot, MD    The patient was evaluated in Emergency Department today for the symptoms described in the history of present illness. He/she was evaluated in the context of the global COVID-19 pandemic, which necessitated consideration that the patient might be at risk for infection with the SARS-CoV-2 virus that causes COVID-19. Institutional protocols and algorithms that pertain to the evaluation of patients at risk for COVID-19 are in a state of rapid change based on information released by regulatory bodies including the CDC and federal and state organizations. These policies and algorithms were followed during the patient's care in the ED.  As part of my medical decision making, I reviewed the following data within  the electronic MEDICAL RECORD NUMBER Nursing notes reviewed and incorporated, Labs reviewed, notes from prior ED visits and [place] Controlled Substance Database   ____________________________________________   FINAL CLINICAL IMPRESSION(S) / ED DIAGNOSES  Final diagnoses:  Chest pain, unspecified type  Atrial fibrillation with RVR ([place])      NEW MEDICATIONS STARTED DURING THIS VISIT:  New Prescriptions   No medications on file     Note:  This document was prepared using Dragon voice recognition software and may include unintentional dictation errors.    Merlyn Lot, MD 12/26/19 Vicenta Dunning    Merlyn Lot, MD 12/26/19 [phone]

## 2019-12-26 ENCOUNTER — Other Ambulatory Visit: Payer: Self-pay

## 2019-12-26 ENCOUNTER — Inpatient Hospital Stay (HOSPITAL_COMMUNITY)
Admit: 2019-12-26 | Discharge: 2019-12-26 | Disposition: A | Discharge status: Home / Self Care | Payer: PPO | Attending: Internal Medicine | Admitting: Internal Medicine

## 2019-12-26 DIAGNOSIS — J9611 Chronic respiratory failure with hypoxia: Secondary | ICD-10-CM | POA: Diagnosis present

## 2019-12-26 DIAGNOSIS — Z20822 Contact with and (suspected) exposure to covid-19: Secondary | ICD-10-CM | POA: Diagnosis present

## 2019-12-26 DIAGNOSIS — I214 Non-ST elevation (NSTEMI) myocardial infarction: Secondary | ICD-10-CM | POA: Diagnosis not present

## 2019-12-26 DIAGNOSIS — Z951 Presence of aortocoronary bypass graft: Secondary | ICD-10-CM | POA: Diagnosis not present

## 2019-12-26 DIAGNOSIS — I252 Old myocardial infarction: Secondary | ICD-10-CM | POA: Diagnosis not present

## 2019-12-26 DIAGNOSIS — I4891 Unspecified atrial fibrillation: Secondary | ICD-10-CM | POA: Diagnosis present

## 2019-12-26 DIAGNOSIS — Z7902 Long term (current) use of antithrombotics/antiplatelets: Secondary | ICD-10-CM | POA: Diagnosis not present

## 2019-12-26 DIAGNOSIS — N1831 Chronic kidney disease, stage 3a: Secondary | ICD-10-CM | POA: Diagnosis present

## 2019-12-26 DIAGNOSIS — Z9119 Patient's noncompliance with other medical treatment and regimen: Secondary | ICD-10-CM | POA: Diagnosis not present

## 2019-12-26 DIAGNOSIS — I2510 Atherosclerotic heart disease of native coronary artery without angina pectoris: Secondary | ICD-10-CM | POA: Diagnosis not present

## 2019-12-26 DIAGNOSIS — E1122 Type 2 diabetes mellitus with diabetic chronic kidney disease: Secondary | ICD-10-CM | POA: Diagnosis present

## 2019-12-26 DIAGNOSIS — Z85828 Personal history of other malignant neoplasm of skin: Secondary | ICD-10-CM | POA: Diagnosis not present

## 2019-12-26 DIAGNOSIS — R197 Diarrhea, unspecified: Secondary | ICD-10-CM

## 2019-12-26 DIAGNOSIS — K219 Gastro-esophageal reflux disease without esophagitis: Secondary | ICD-10-CM | POA: Diagnosis present

## 2019-12-26 DIAGNOSIS — I350 Nonrheumatic aortic (valve) stenosis: Secondary | ICD-10-CM

## 2019-12-26 DIAGNOSIS — I25118 Atherosclerotic heart disease of native coronary artery with other forms of angina pectoris: Secondary | ICD-10-CM | POA: Diagnosis present

## 2019-12-26 DIAGNOSIS — I248 Other forms of acute ischemic heart disease: Secondary | ICD-10-CM | POA: Diagnosis not present

## 2019-12-26 DIAGNOSIS — E1165 Type 2 diabetes mellitus with hyperglycemia: Secondary | ICD-10-CM | POA: Diagnosis present

## 2019-12-26 DIAGNOSIS — D509 Iron deficiency anemia, unspecified: Secondary | ICD-10-CM

## 2019-12-26 DIAGNOSIS — Z7901 Long term (current) use of anticoagulants: Secondary | ICD-10-CM | POA: Diagnosis not present

## 2019-12-26 DIAGNOSIS — I5032 Chronic diastolic (congestive) heart failure: Secondary | ICD-10-CM

## 2019-12-26 DIAGNOSIS — I4819 Other persistent atrial fibrillation: Secondary | ICD-10-CM | POA: Diagnosis present

## 2019-12-26 DIAGNOSIS — Z955 Presence of coronary angioplasty implant and graft: Secondary | ICD-10-CM | POA: Diagnosis not present

## 2019-12-26 DIAGNOSIS — E785 Hyperlipidemia, unspecified: Secondary | ICD-10-CM | POA: Diagnosis present

## 2019-12-26 DIAGNOSIS — I130 Hypertensive heart and chronic kidney disease with heart failure and stage 1 through stage 4 chronic kidney disease, or unspecified chronic kidney disease: Secondary | ICD-10-CM | POA: Diagnosis present

## 2019-12-26 DIAGNOSIS — I272 Pulmonary hypertension, unspecified: Secondary | ICD-10-CM | POA: Diagnosis present

## 2019-12-26 LAB — BASIC METABOLIC PANEL
Anion gap: 11 (ref 5–15)
Anion gap: 10 (ref 5–15)
BUN: 17 mg/dL (ref 8–23)
BUN: 16 mg/dL (ref 8–23)
CO2: 23 mmol/L (ref 22–32)
CO2: 24 mmol/L (ref 22–32)
Calcium: 8.7 mg/dL — ABNORMAL LOW (ref 8.9–10.3)
Calcium: 8.6 mg/dL — ABNORMAL LOW (ref 8.9–10.3)
Chloride: 106 mmol/L (ref 98–111)
Chloride: 106 mmol/L (ref 98–111)
Creatinine, Ser: 1.33 mg/dL — ABNORMAL HIGH (ref 0.61–1.24)
Creatinine, Ser: 1.29 mg/dL — ABNORMAL HIGH (ref 0.61–1.24)
GFR calc Af Amer: >60 mL/min (ref >60)
GFR calc Af Amer: >60 mL/min (ref >60)
GFR calc non Af Amer: 53 mL/min — ABNORMAL LOW (ref >60)
GFR calc non Af Amer: 55 mL/min — ABNORMAL LOW (ref >60)
Glucose, Bld: 258 mg/dL — ABNORMAL HIGH (ref 70–99)
Glucose, Bld: 168 mg/dL — ABNORMAL HIGH (ref 70–99)
Potassium: 3.7 mmol/L (ref 3.5–5.1)
Potassium: 3.6 mmol/L (ref 3.5–5.1)
Sodium: 140 mmol/L (ref 135–145)
Sodium: 140 mmol/L (ref 135–145)

## 2019-12-26 LAB — CBC
HCT: 30.6 % — ABNORMAL LOW (ref 39.0–52.0)
Hemoglobin: 10.1 g/dL — ABNORMAL LOW (ref 13.0–17.0)
MCH: 33.6 pg (ref 26.0–34.0)
MCHC: 33 g/dL (ref 30.0–36.0)
MCV: 101.7 fL — ABNORMAL HIGH (ref 80.0–100.0)
Platelets: 182 10*3/uL (ref 150–400)
RBC: 3.01 MIL/uL — ABNORMAL LOW (ref 4.22–5.81)
RDW: 14.3 % (ref 11.5–15.5)
WBC: 4.3 10*3/uL (ref 4.0–10.5)
nRBC: 0 % (ref 0.0–0.2)

## 2019-12-26 LAB — TROPONIN I (HIGH SENSITIVITY)
Troponin I (High Sensitivity): 15 ng/L (ref <18)
Troponin I (High Sensitivity): 102 ng/L (ref <18)
Troponin I (High Sensitivity): 754 ng/L (ref <18)
Troponin I (High Sensitivity): 809 ng/L (ref <18)

## 2019-12-26 LAB — LIPID PANEL
Cholesterol: 105 mg/dL (ref 0–200)
HDL: 36 mg/dL — ABNORMAL LOW (ref >40)
LDL Cholesterol: 44 mg/dL (ref 0–99)
Total CHOL/HDL Ratio: 2.9 RATIO
Triglycerides: 123 mg/dL (ref <150)
VLDL: 25 mg/dL (ref 0–40)

## 2019-12-26 LAB — GLUCOSE, CAPILLARY
Glucose-Capillary: 145 mg/dL — ABNORMAL HIGH (ref 70–99)
Glucose-Capillary: 145 mg/dL — ABNORMAL HIGH (ref 70–99)
Glucose-Capillary: 200 mg/dL — ABNORMAL HIGH (ref 70–99)
Glucose-Capillary: 243 mg/dL — ABNORMAL HIGH (ref 70–99)
Glucose-Capillary: 236 mg/dL — ABNORMAL HIGH (ref 70–99)

## 2019-12-26 LAB — ECHOCARDIOGRAM COMPLETE
AR max vel: 1.2 cm2
AV Area VTI: 1.72 cm2
AV Area mean vel: 1.27 cm2
AV Mean grad: 8 mmHg
AV Peak grad: 16.5 mmHg
Ao pk vel: 2.03 m/s
Area-P 1/2: 2.6 cm2
Height: 68 in
S' Lateral: 2.63 cm
Weight: 2880 oz

## 2019-12-26 LAB — C DIFFICILE QUICK SCREEN W PCR REFLEX
C Diff antigen: NEGATIVE
C Diff interpretation: NOT DETECTED
C Diff toxin: NEGATIVE

## 2019-12-26 LAB — PROTIME-INR
INR: 2.1 — ABNORMAL HIGH (ref 0.8–1.2)
Prothrombin Time: 22.9 seconds — ABNORMAL HIGH (ref 11.4–15.2)

## 2019-12-26 LAB — HEMOGLOBIN A1C
Hgb A1c MFr Bld: 5.7 % — ABNORMAL HIGH (ref 4.8–5.6)
Mean Plasma Glucose: 116.89 mg/dL

## 2019-12-26 LAB — MAGNESIUM: Magnesium: 2.3 mg/dL (ref 1.7–2.4)

## 2019-12-26 LAB — TSH: TSH: 3.237 u[IU]/mL (ref 0.350–4.500)

## 2019-12-26 LAB — SARS CORONAVIRUS 2 BY RT PCR (HOSPITAL ORDER, PERFORMED IN ~~LOC~~ HOSPITAL LAB): SARS Coronavirus 2: NEGATIVE

## 2019-12-26 MED ORDER — INSULIN ASPART 100 UNIT/ML ~~LOC~~ SOLN
0.0000 [IU] | Freq: Three times a day (TID) | SUBCUTANEOUS | Status: DC
  Administered 2019-12-26: 2 [IU] via SUBCUTANEOUS
  Administered 2019-12-26: 3 [IU] via SUBCUTANEOUS
  Administered 2019-12-27: 15 [IU] via SUBCUTANEOUS
  Administered 2019-12-27: 2 [IU] via SUBCUTANEOUS
  Administered 2019-12-28: 8 [IU] via SUBCUTANEOUS
  Administered 2019-12-28: 3 [IU] via SUBCUTANEOUS
  Filled 2019-12-26 (×6): qty 1

## 2019-12-26 MED ORDER — AMLODIPINE BESYLATE 5 MG PO TABS
5.0000 mg | ORAL_TABLET | Freq: Every day | ORAL | Status: DC
  Administered 2019-12-26 – 2019-12-28 (×3): 5 mg via ORAL
  Filled 2019-12-26 (×4): qty 1

## 2019-12-26 MED ORDER — ROSUVASTATIN CALCIUM 10 MG PO TABS
40.0000 mg | ORAL_TABLET | Freq: Every day | ORAL | Status: DC
  Administered 2019-12-26 – 2019-12-27 (×2): 40 mg via ORAL
  Filled 2019-12-26 (×2): qty 4
  Filled 2019-12-26: qty 2

## 2019-12-26 MED ORDER — DILTIAZEM HCL-DEXTROSE 125-5 MG/125ML-% IV SOLN (PREMIX): 5.0000 mg/h | INTRAVENOUS | Status: DC

## 2019-12-26 MED ORDER — RAMIPRIL 10 MG PO CAPS
10.0000 mg | ORAL_CAPSULE | Freq: Every day | ORAL | Status: DC
  Administered 2019-12-26 – 2019-12-28 (×3): 10 mg via ORAL
  Filled 2019-12-26 (×3): qty 1

## 2019-12-26 MED ORDER — WARFARIN SODIUM 5 MG PO TABS
5.0000 mg | ORAL_TABLET | Freq: Once | ORAL | Status: AC
  Administered 2019-12-26: 5 mg via ORAL
  Filled 2019-12-26: qty 1

## 2019-12-26 MED ORDER — VITAMIN B-12 1000 MCG PO TABS
1000.0000 ug | ORAL_TABLET | Freq: Every evening | ORAL | Status: DC
  Administered 2019-12-26 – 2019-12-27 (×2): 1000 ug via ORAL
  Filled 2019-12-26 (×3): qty 1

## 2019-12-26 MED ORDER — MORPHINE SULFATE (PF) 4 MG/ML IV SOLN: 4.0000 mg | INTRAVENOUS | Status: DC | PRN

## 2019-12-26 MED ORDER — CLOPIDOGREL BISULFATE 75 MG PO TABS
75.0000 mg | ORAL_TABLET | Freq: Every day | ORAL | Status: DC
  Administered 2019-12-26 – 2019-12-28 (×3): 75 mg via ORAL
  Filled 2019-12-26 (×4): qty 1

## 2019-12-26 MED ORDER — ONDANSETRON HCL 4 MG/2ML IJ SOLN: 4.0000 mg | Freq: Four times a day (QID) | INTRAMUSCULAR | Status: DC | PRN

## 2019-12-26 MED ORDER — ALPRAZOLAM 0.25 MG PO TABS
0.2500 mg | ORAL_TABLET | Freq: Every day | ORAL | Status: DC | PRN
  Administered 2019-12-26: 0.25 mg via ORAL
  Filled 2019-12-26: qty 1

## 2019-12-26 MED ORDER — WARFARIN - PHARMACIST DOSING INPATIENT
Freq: Every day | Status: DC
  Filled 2019-12-26: qty 1

## 2019-12-26 MED ORDER — INSULIN ASPART 100 UNIT/ML ~~LOC~~ SOLN
0.0000 [IU] | Freq: Every day | SUBCUTANEOUS | Status: DC
  Administered 2019-12-26: 2 [IU] via SUBCUTANEOUS
  Filled 2019-12-26: qty 1

## 2019-12-26 MED ORDER — INSULIN GLARGINE 100 UNIT/ML ~~LOC~~ SOLN
15.0000 [IU] | Freq: Every day | SUBCUTANEOUS | Status: DC
  Administered 2019-12-26 – 2019-12-28 (×2): 15 [IU] via SUBCUTANEOUS
  Filled 2019-12-26 (×4): qty 0.15

## 2019-12-26 MED ORDER — ONDANSETRON HCL 4 MG/2ML IJ SOLN: 4.0000 mg | Freq: Once | INTRAMUSCULAR | Status: DC

## 2019-12-26 MED ORDER — AMIODARONE HCL 200 MG PO TABS
200.0000 mg | ORAL_TABLET | Freq: Two times a day (BID) | ORAL | Status: DC
  Administered 2019-12-26 – 2019-12-27 (×3): 200 mg via ORAL
  Filled 2019-12-26 (×4): qty 1

## 2019-12-26 MED ORDER — ACETAMINOPHEN 325 MG PO TABS: 650.0000 mg | ORAL_TABLET | ORAL | Status: DC | PRN

## 2019-12-26 MED ORDER — FUROSEMIDE 20 MG PO TABS
20.0000 mg | ORAL_TABLET | Freq: Every day | ORAL | Status: DC
  Administered 2019-12-26 – 2019-12-28 (×3): 20 mg via ORAL
  Filled 2019-12-26 (×3): qty 1

## 2019-12-26 MED ORDER — NITROGLYCERIN 0.3 MG SL SUBL
0.3000 mg | SUBLINGUAL_TABLET | SUBLINGUAL | Status: DC | PRN
  Filled 2019-12-26: qty 100

## 2019-12-26 MED ORDER — SUCRALFATE 1 G PO TABS
1.0000 g | ORAL_TABLET | Freq: Two times a day (BID) | ORAL | Status: DC
  Administered 2019-12-26 – 2019-12-28 (×5): 1 g via ORAL
  Filled 2019-12-26 (×5): qty 1

## 2019-12-26 MED ORDER — PANTOPRAZOLE SODIUM 40 MG PO TBEC
40.0000 mg | DELAYED_RELEASE_TABLET | Freq: Every day | ORAL | Status: DC
  Administered 2019-12-26 – 2019-12-28 (×3): 40 mg via ORAL
  Filled 2019-12-26 (×3): qty 1

## 2019-12-26 NOTE — ED Notes (Signed)
Report called to ashley rn floor nurse °

## 2019-12-26 NOTE — Progress Notes (Signed)
PROGRESS NOTE    Seth Hardy  DTH:[id number] DOB: 05-11-1946 DOA: 12/25/2019 PCP: Rusty Aus, MD    Chief complaint.  Palpitation.  Brief Narrative: Seth Hardy is a 74 y.o. male with medical history significant of coronary artery disease status post CABG and recent cath, hypertension, diabetes, chronic kidney disease stage III, GERD, hyperlipidemia, iron deficiency anemia and basal cell carcinoma of the skin who is on chronic warfarin therapy on and also on amiodarone at home.  Patient came to the ER today secondary to palpitations and chest discomfort.  He is a placed on diltiazem drip. He is also on night oxygen with chronic hypoxemic respiratory failure.  8/3.  Patient converted to sinus rhythm, diltiazem drip discontinued.  However, patient troponin increased to 754.  Cardiology consult obtained. Patient also has diarrhea for the last 3 days, multiple loose stools.  But no abdominal pain or nausea vomiting.  C. difficile toxin sent.   Assessment & Plan:   Principal Problem:   Rapid atrial fibrillation (HCC) Active Problems:   CAD, ARTERY BYPASS GRAFT   Long term (current) use of anticoagulants   Hyperlipidemia   Diabetes mellitus type II, controlled ([place])   Benign essential hypertension   NSTEMI (non-ST elevated myocardial infarction) ([place])  #1.  Paroxysmal atrial fibrillation with rapid ventricular response. Patient has converted to sinus rhythm.  Diltiazem already taken off.  We will continue anticoagulation for now.  Patient will be evaluated by cardiology, notified Dr. Saunders Revel.  2.  NSTEMI. Patient had some chest discomfort at time of admission, this could be due to rapid ventricle response with demand ischemia.  However, cannot rule out real ischemia.  Cardiology consult is obtained.  3.  Diarrhea. Check C. difficile toxin.  4.  Type 2 diabetes uncontrolled with hyperglycemia. Well-controlled.  Continue sliding scale insulin.  5.  Essential  hypertension. Continues on home medicines.  6.  Chronic hypoxemic respiratory failure. Patient is on night oxygen.  7.  Chronic kidney disease stage IIIa. Reviewed previous lab, renal function still stable.   DVT prophylaxis: warfarin Code Status: Full Family Communication: Wife at bedside. Disposition Plan:  . Patient came from:            . Anticipated d/c place: . Barriers to d/c OR conditions which need to be met to effect a safe d/c:   Consultants:   Cardiology  Procedures: None  Antimicrobials: None  Subjective: Patient still have significant diarrhea, no nausea vomiting.  No abdominal pain. No additional palpitation, no chest pain or discomfort.  No short of breath. No fever or chills.   Objective: Vitals:   12/26/19 0230 12/26/19 0300 12/26/19 0430 12/26/19 0630  BP: (!) 135/53 (!) 137/55 (!) 130/57 (!) 145/50  Pulse: (!) 41 (!) 45 (!) 42 (!) 45  Resp: 14 15 15 15   Temp:      TempSrc:      SpO2: 93% 93% 91% 95%  Weight:      Height:        Intake/Output Summary (Last 24 hours) at 12/26/2019 0753 Last data filed at 12/26/2019 0048 Gross per 24 hour  Intake 25.88 ml  Output --  Net 25.88 ml   Filed Weights   12/25/19 2320  Weight: 81.6 kg    Examination:  General exam: Appears calm and comfortable  Respiratory system: Decreased breathing sounds with very few crackles in the base bilaterally. Respiratory effort normal. Cardiovascular system: S1 & S2 heard, regular, mild bradycardia. No JVD, murmurs, rubs,  gallops or clicks. No pedal edema. Gastrointestinal system: Abdomen is nondistended, soft and nontender. No organomegaly or masses felt. Normal bowel sounds heard. Central nervous system: Alert and oriented. No focal neurological deficits. Extremities: Symmetric 5 x 5 power. Skin: No rashes, lesions or ulcers Psychiatry: Judgement and insight appear normal. Mood & affect appropriate.     Data Reviewed: I have personally reviewed following labs  and imaging studies  CBC: Recent Labs  Lab 12/25/19 2323  WBC 4.1  HGB 10.5*  HCT 33.0*  MCV 104.1*  PLT 856   Basic Metabolic Panel: Recent Labs  Lab 12/25/19 2323  NA 140  K 3.7  CL 106  CO2 23  GLUCOSE 258*  BUN 17  CREATININE 1.33*  CALCIUM 8.7*   GFR: Estimated Creatinine Clearance: 47.9 mL/min (A) (by C-G formula based on SCr of 1.33 mg/dL (H)). Liver Function Tests: No results for input(s): AST, ALT, ALKPHOS, BILITOT, PROT, ALBUMIN in the last 168 hours. No results for input(s): LIPASE, AMYLASE in the last 168 hours. No results for input(s): AMMONIA in the last 168 hours. Coagulation Profile: Recent Labs  Lab 12/20/19 1434 12/25/19 2323 12/26/19 0715  INR 2.7 1.8* 2.1*   Cardiac Enzymes: No results for input(s): CKTOTAL, CKMB, CKMBINDEX, TROPONINI in the last 168 hours. BNP (last 3 results) No results for input(s): PROBNP in the last 8760 hours. HbA1C: No results for input(s): HGBA1C in the last 72 hours. CBG: No results for input(s): GLUCAP in the last 168 hours. Lipid Profile: No results for input(s): CHOL, HDL, LDLCALC, TRIG, CHOLHDL, LDLDIRECT in the last 72 hours. Thyroid Function Tests: No results for input(s): TSH, T4TOTAL, FREET4, T3FREE, THYROIDAB in the last 72 hours. Anemia Panel: No results for input(s): VITAMINB12, FOLATE, FERRITIN, TIBC, IRON, RETICCTPCT in the last 72 hours. Sepsis Labs: No results for input(s): PROCALCITON, LATICACIDVEN in the last 168 hours.  Recent Results (from the past 240 hour(s))  SARS Coronavirus 2 by RT PCR (hospital order, performed in [facility] hospital lab) Nasopharyngeal Nasopharyngeal Swab     Status: None   Collection Time: 12/26/19 12:47 AM   Specimen: Nasopharyngeal Swab  Result Value Ref Range Status   SARS Coronavirus 2 NEGATIVE NEGATIVE Final    Comment: (NOTE) SARS-CoV-2 target nucleic acids are NOT DETECTED.  The SARS-CoV-2 RNA is generally detectable in upper and lower respiratory  specimens during the acute phase of infection. The lowest concentration of SARS-CoV-2 viral copies this assay can detect is 250 copies / mL. A negative result does not preclude SARS-CoV-2 infection and should not be used as the sole basis for treatment or other patient management decisions.  A negative result may occur with improper specimen collection / handling, submission of specimen other than nasopharyngeal swab, presence of viral mutation(s) within the areas targeted by this assay, and inadequate number of viral copies (<250 copies / mL). A negative result must be combined with clinical observations, patient history, and epidemiological information.  Fact Sheet for Patients:   StrictlyIdeas.no  Fact Sheet for Healthcare Providers: BankingDealers.co.za  This test is not yet approved or  cleared by the [place] FDA and has been authorized for detection and/or diagnosis of SARS-CoV-2 by FDA under an Emergency Use Authorization (EUA).  This EUA will remain in effect (meaning this test can be used) for the duration of the COVID-19 declaration under Section 564(b)(1) of the Act, 21 U.S.C. section 360bbb-3(b)(1), unless the authorization is terminated or revoked sooner.  Performed at [facility], [address] [place], [place] [address]          Radiology Studies: DG Chest Portable 1 View  Result Date: 12/25/2019 CLINICAL DATA:  Chest pain EXAM: PORTABLE CHEST 1 VIEW COMPARISON:  CT 08/20/2015, radiograph 07/25/2015 FINDINGS: Postsurgical changes related to prior CABG including intact and aligned sternotomy wires and multiple surgical clips projecting over the mediastinum. Mild cardiomegaly is similar to prior accounting for the portable technique. The aorta is calcified. The remaining cardiomediastinal contours are unremarkable. Central pulmonary vascular congestion is noted. There are few peripheral septal lines as  well as some hazy bibasilar opacity. No pneumothorax or visible effusion. No focal consolidative opacity is seen. No acute osseous or soft tissue abnormality. Telemetry leads overlie the chest. IMPRESSION: 1. Mild cardiomegaly with central pulmonary vascular congestion with features of mild interstitial edema. Electronically Signed   By: Lovena Le M.D.   On: 12/25/2019 23:45        Scheduled Meds: . ondansetron (ZOFRAN) IV  4 mg Intravenous Once  . Warfarin - Pharmacist Dosing Inpatient   Does not apply q1600   Continuous Infusions: . diltiazem (CARDIZEM) infusion       LOS: 0 days    Time spent: 34 minutes    Sharen Hones, MD [facility]   To contact the attending provider between 7A-7P or the covering provider during after hours 7P-7A, please log into the web site [email or web address] and access using universal National password for that web site. If you do not have the password, please call the hospital operator.  12/26/2019, 7:53 AM

## 2019-12-26 NOTE — ED Notes (Signed)
Pt eating at time of vitals being taken so temperature unable to be taken.

## 2019-12-26 NOTE — H&P (Signed)
History and Physical   Seth Hardy:[id number] DOB: 22-Dec-1945 DOA: 12/25/2019  Referring MD/NP/PA: Dr. Quentin Cornwall  PCP: Rusty Aus, MD   Outpatient Specialists: [facility]  Patient coming from: Home  Chief Complaint: Palpitations with chest pain  HPI: Seth Hardy is a 74 y.o. male with medical history significant of coronary artery disease status post CABG and recent cath, hypertension, diabetes, chronic kidney disease stage III, GERD, hyperlipidemia, iron deficiency anemia and basal cell carcinoma of the skin who is on chronic warfarin therapy on and also on amiodarone at home.  Patient came to the ER today secondary to palpitations and chest discomfort.  He has been doing much better since his stent about 4 months ago.  Patient was seen and evaluated in the ER.  He was found to be in atrial fibrillation with rapid ventricular response.  Despite taking his amiodarone at home before coming to the ER he is still having significant tachycardia.  He has been placed on Cardizem drip and being admitted to the medical service.  Patient is also on Plavix for his coronary artery disease.  No active bleed.  No chest pain.  Cardiac enzymes remain negative..  ED Course: Temperature 98.6 blood pressure 133/77 pulse 131 respirate of 21 oxygen sat 97% on room air.  CBC showed hemoglobin 10.5 and chemistry showed creatinine 1.33.  Glucose 286 and INR of 1.8.  Chest x-ray showed cardiomegaly with mild interstitial edema.  EKG showed A. fib with RVR.  Patient being admitted to the hospital for further evaluation and treatment.  Review of Systems: As per HPI otherwise 10 point review of systems negative.    Past Medical History:  Diagnosis Date  . Anemia 08/2015   received 2 units rbc one week ago, 3 IV iron infusion -last 1 week.  . Basal cell carcinoma of skin 2012   removed several spots from arms and back  . CAD (coronary artery disease)    a. 01/2010 CABG x 4: LIMA->LAD, VG->D1,  VG->OM1, VG->PDA. b. NSTEMI 02/2012 in setting of AF-RVR with cath s/p BMS to RCA (plan for 1 month, possibly up to 3 months of Plavix)  . Carotid disease, bilateral ([place])    a. 03/2011 U/S: 40-59% bilat Carotid dzs;  b. 04/2015 Carotid U/S: 40-59% bilat ICA stenosis; c. 05/2015 CTA Neck: 16% RICA, 60% LICA, mod-marked R vertebral stenosis, mod L vertebral stenosis.  . Chronic kidney disease    small obtusion per pcp. ultrasound done on 08/29/2015  . Diabetes mellitus type II, controlled ([place])    a. Variable CBG 02/2012 - several meds adjusted.  Marland Kitchen Dysrhythmia    intermittent Atrial Fibrillation  . GERD (gastroesophageal reflux disease)   . Heart murmur   . Hyperlipidemia   . Hypertension   . Hypertensive heart disease   . Iron deficiency anemia    "gets infusions q once in awhile" (04/27/2018)  . Macular edema bil   lazer work done previously  . Mild aortic stenosis    a. 03/2011 Echo: EF 55-60%, Mild AS. b. Mild by cath 02/2012; c. 01/2014 Echo: EF 65-70%, Gr 1 DD, mild AS, mildly dil LA.  . Multiple gastric ulcers 2006  . Myocardial infarction ([place]) 02/2012   stents (x1) at time  . Myocardial infarction [facility])    "2nd one was after 02/2012; don't know date" (04/27/2018)  . Obesity   . On home oxygen therapy    "2.5 w/CPAP" (04/27/2018)  . OSA on CPAP  CPAP settings 3 with oxygen 2.5 (04/27/2018)  . PAF (paroxysmal atrial fibrillation) ([place])    a. Newly dx 02/2012 & initiated on Coumadin (spont converted to NSR).  . Shortness of breath dyspnea   . Transfusion history    transfusions -1 month ago -2 units    Past Surgical History:  Procedure Laterality Date  . ANGIOPLASTY    . BASAL CELL CARCINOMA EXCISION     "shoulder, arm X 2"  . CARDIAC CATHETERIZATION  09-13-12  . CARDIAC CATHETERIZATION  Sep 14, 2019   [facility] x1 stent  . CATARACT EXTRACTION W/ INTRAOCULAR LENS  IMPLANT, BILATERAL Bilateral September 14, 2014  . COLONOSCOPY WITH PROPOFOL N/A 10/15/2015   Procedure: COLONOSCOPY  WITH PROPOFOL;  Surgeon: Gatha Mayer, MD;  Location: [facility] ENDOSCOPY;  Service: Endoscopy;  Laterality: N/A;  . CORONARY ANGIOPLASTY     Dr. Fletcher Anon  . CORONARY ARTERY BYPASS GRAFT  13-Sep-2009   CABG X3-4; [place], [place], [place],   . CORONARY ATHERECTOMY  04/27/2018  . CORONARY ATHERECTOMY N/A 04/27/2018   Procedure: CORONARY ATHERECTOMY;  Surgeon: Belva Crome, MD;  Location: [facility] CV LAB;  Service: Cardiovascular;  Laterality: N/A;  . cyst L kidney  [place]  . ENDARTERECTOMY Left 08/30/2015   Procedure: ENDARTERECTOMY CAROTID;  Surgeon: Katha Cabal, MD;  Location: [facility] ORS;  Service: Vascular;  Laterality: Left;  . ESOPHAGOGASTRODUODENOSCOPY (EGD) WITH PROPOFOL N/A 10/15/2015   Procedure: ESOPHAGOGASTRODUODENOSCOPY (EGD) WITH PROPOFOL;  Surgeon: Gatha Mayer, MD;  Location: [facility];  Service: Endoscopy;  Laterality: N/A;  . EYE SURGERY Bilateral    "has macular edema cauterized" (04/27/2018)  . INGUINAL HERNIA REPAIR Right   . LEFT HEART CATHETERIZATION WITH CORONARY ANGIOGRAM N/A 03/09/2012   Procedure: LEFT HEART CATHETERIZATION WITH CORONARY ANGIOGRAM;  Surgeon: Wellington Hampshire, MD;  Location: [facility] CATH LAB;  Service: Cardiovascular;  Laterality: N/A;  . PERCUTANEOUS CORONARY STENT INTERVENTION (PCI-S)  03/09/2012   Procedure: PERCUTANEOUS CORONARY STENT INTERVENTION (PCI-S);  Surgeon: Wellington Hampshire, MD;  Location: [facility] CATH LAB;  Service: Cardiovascular;;  . RIGHT/LEFT HEART CATH AND CORONARY ANGIOGRAPHY N/A 04/25/2018   Procedure: RIGHT/LEFT HEART CATH AND CORONARY ANGIOGRAPHY;  Surgeon: Wellington Hampshire, MD;  Location: [facility] CV LAB;  Service: Cardiovascular;  Laterality: N/A;     reports that he has never smoked. He has never used smokeless tobacco. He reports current alcohol use. He reports that he does not use drugs.  No Known Allergies  Family History  Problem Relation Age of Onset  . COPD Mother        alive 28  . Bladder Cancer Mother   .  Heart attack Father        09/13/13 deceased  . Lung cancer Maternal Aunt   . Liver cancer Maternal Aunt   . Kidney disease Paternal Uncle   . Cancer Other        all paternal aunts and uncles  . Prostate cancer Neg Hx   . Kidney cancer Neg Hx      Prior to Admission medications   Medication Sig Start Date End Date Taking? Authorizing Provider  acetaminophen (TYLENOL) 500 MG tablet Take 1,000 mg by mouth every 6 (six) hours as needed for moderate pain or headache.    [provider]  ALPRAZolam Duanne Moron) 0.25 MG tablet Take 0.25 mg by mouth daily as needed for anxiety.    [provider]  amiodarone (PACERONE) 200 MG tablet TAKE 1/2 TABLET AT  BEDTIME 01/07/18   Wellington Hampshire, MD  amLODipine (NORVASC) 5 MG tablet Take one tablet by mouth one time daily 07/25/14   Wellington Hampshire, MD  clopidogrel (PLAVIX) 75 MG tablet TAKE 1 TABLET BY MOUTH DAILY 07/19/19   Wellington Hampshire, MD  Cyanocobalamin (B-12) 1000 MCG CAPS Take 1,000 mcg by mouth every evening.     [provider]  Ferrous Gluconate-C-Folic Acid (IRON-C PO) Take 1 tablet by mouth daily.    [provider]  furosemide (LASIX) 20 MG tablet Take 1 tablet (20 mg total) by mouth daily. 04/25/18   Wellington Hampshire, MD  glimepiride (AMARYL) 4 MG tablet Take 4 mg by mouth daily.     [provider]  insulin glargine (LANTUS) 100 UNIT/ML injection Inject 20 Units into the skin daily.     [provider]  nitroGLYCERIN (NITROSTAT) 0.4 MG SL tablet PLACE 1 TABLET UNDER THE TONGUE EVERY 5 MINUTES FOR UP TO 3 DOSES AS NEEDED FOR CHEST PAINS, IF NO RELIEF CALL 911 12/15/18   Wellington Hampshire, MD  NOVOLOG FLEXPEN 100 UNIT/ML FlexPen Inject 6-36 Units into the skin 2 (two) times daily with a meal.  05/26/17   [provider]  omega-3 acid ethyl esters (LOVAZA) 1 g capsule Take 1 capsule (1 g total) by mouth daily. 04/28/18   Tommie Raymond, NP  Omega-3 Fatty Acids (FISH OIL) 1000 MG CAPS  Take 1,000 mg by mouth every evening.    [provider]  OXYGEN Inhale 2.5 L/min into the lungs at bedtime. With CPAP    [provider]  pantoprazole (PROTONIX) 40 MG tablet Take 1 tablet (40 mg total) by mouth daily. 06/11/14   Wellington Hampshire, MD  Polyethylene Glycol 400 (BLINK TEARS OP) Place 1 drop into both eyes daily as needed (dry eyes).    [provider]  ramipril (ALTACE) 10 MG capsule Take 10 mg by mouth daily.  09/17/15   [provider]  rosuvastatin (CRESTOR) 40 MG tablet TAKE ONE TABLET BY MOUTH EVERY DAY 06/05/19   Wellington Hampshire, MD  sucralfate (CARAFATE) 1 g tablet Take 1 g by mouth 2 (two) times daily. 08/23/19 08/22/20  [provider]  tadalafil (CIALIS) 10 MG tablet Take 10 mg by mouth as needed. 08/23/19 09/22/19  [provider]  vitamin C (ASCORBIC ACID) 500 MG tablet Take 500 mg by mouth every evening.    [provider]  warfarin (COUMADIN) 5 MG tablet Take 2.5-5 mg by mouth See admin instructions. Take 5 mg at night on Sun, Mon, Wed, Thur, and Fri.  Take 2.5 mg at night on Tues and Sat 07/17/16   [provider]  zinc gluconate 50 MG tablet Take 50 mg by mouth daily.    [provider]    Physical Exam: Vitals:   12/25/19 2318 12/25/19 2320 12/26/19 0025  BP: 133/77    Pulse: (!) 131    Resp: (!) 21    Temp:   98.6 F (37 C)  TempSrc:   Oral  SpO2: 97%    Weight:  81.6 kg   Height:  5\' 8"  (1.727 m)       Constitutional: Acutely ill looking, no distress Vitals:   12/25/19 2318 12/25/19 2320 12/26/19 0025  BP: 133/77    Pulse: (!) 131    Resp: (!) 21    Temp:   98.6 F (37 C)  TempSrc:   Oral  SpO2: 97%  Weight:  81.6 kg   Height:  5\' 8"  (1.727 m)    Eyes: PERRL, lids and conjunctivae normal ENMT: Mucous membranes are moist. Posterior pharynx clear of any exudate or lesions.Normal dentition.  Neck: normal, supple, no masses, no thyromegaly Respiratory: clear to  auscultation bilaterally, no wheezing, no crackles. Normal respiratory effort. No accessory muscle use.  Cardiovascular: Irregularly irregular with tachycardia, no murmurs / rubs / gallops. No extremity edema. 2+ pedal pulses. No carotid bruits.  Abdomen: no tenderness, no masses palpated. No hepatosplenomegaly. Bowel sounds positive.  Musculoskeletal: no clubbing / cyanosis. No joint deformity upper and lower extremities. Good ROM, no contractures. Normal muscle tone.  Skin: no rashes, lesions, ulcers. No induration Neurologic: CN 2-12 grossly intact. Sensation intact, DTR normal. Strength 5/5 in all 4.  Psychiatric: Normal judgment and insight. Alert and oriented x 3.  Anxious mood.     Labs on Admission: I have personally reviewed following labs and imaging studies  CBC: Recent Labs  Lab 12/25/19 2323  WBC 4.1  HGB 10.5*  HCT 33.0*  MCV 104.1*  PLT 299   Basic Metabolic Panel: Recent Labs  Lab 12/25/19 2323  NA 140  K 3.7  CL 106  CO2 23  GLUCOSE 258*  BUN 17  CREATININE 1.33*  CALCIUM 8.7*   GFR: Estimated Creatinine Clearance: 47.9 mL/min (A) (by C-G formula based on SCr of 1.33 mg/dL (H)). Liver Function Tests: No results for input(s): AST, ALT, ALKPHOS, BILITOT, PROT, ALBUMIN in the last 168 hours. No results for input(s): LIPASE, AMYLASE in the last 168 hours. No results for input(s): AMMONIA in the last 168 hours. Coagulation Profile: Recent Labs  Lab 12/20/19 1434 12/25/19 2323  INR 2.7 1.8*   Cardiac Enzymes: No results for input(s): CKTOTAL, CKMB, CKMBINDEX, TROPONINI in the last 168 hours. BNP (last 3 results) No results for input(s): PROBNP in the last 8760 hours. HbA1C: No results for input(s): HGBA1C in the last 72 hours. CBG: No results for input(s): GLUCAP in the last 168 hours. Lipid Profile: No results for input(s): CHOL, HDL, LDLCALC, TRIG, CHOLHDL, LDLDIRECT in the last 72 hours. Thyroid Function Tests: No results for input(s): TSH,  T4TOTAL, FREET4, T3FREE, THYROIDAB in the last 72 hours. Anemia Panel: No results for input(s): VITAMINB12, FOLATE, FERRITIN, TIBC, IRON, RETICCTPCT in the last 72 hours. Urine analysis:    Component Value Date/Time   COLORURINE YELLOW (A) 07/26/2017 1341   APPEARANCEUR CLEAR (A) 07/26/2017 1341   APPEARANCEUR Clear 01/21/2013 0441   LABSPEC 1.011 07/26/2017 1341   LABSPEC 1.005 01/21/2013 0441   PHURINE 6.0 07/26/2017 1341   GLUCOSEU 150 (A) 07/26/2017 1341   GLUCOSEU >=500 01/21/2013 0441   HGBUR NEGATIVE 07/26/2017 1341   BILIRUBINUR NEGATIVE 07/26/2017 1341   BILIRUBINUR Negative 01/21/2013 0441   KETONESUR NEGATIVE 07/26/2017 1341   PROTEINUR NEGATIVE 07/26/2017 1341   UROBILINOGEN 1.0 02/17/2010 0532   NITRITE NEGATIVE 07/26/2017 1341   LEUKOCYTESUR NEGATIVE 07/26/2017 1341   LEUKOCYTESUR Negative 01/21/2013 0441   Sepsis Labs: @LABRCNTIP (procalcitonin:4,lacticidven:4) )No results found for this or any previous visit (from the past 240 hour(s)).   Radiological Exams on Admission: DG Chest Portable 1 View  Result Date: 12/25/2019 CLINICAL DATA:  Chest pain EXAM: PORTABLE CHEST 1 VIEW COMPARISON:  CT 08/20/2015, radiograph 07/25/2015 FINDINGS: Postsurgical changes related to prior CABG including intact and aligned sternotomy wires and multiple surgical clips projecting over the mediastinum. Mild cardiomegaly is similar to prior accounting for the portable technique. The aorta is calcified. The  remaining cardiomediastinal contours are unremarkable. Central pulmonary vascular congestion is noted. There are few peripheral septal lines as well as some hazy bibasilar opacity. No pneumothorax or visible effusion. No focal consolidative opacity is seen. No acute osseous or soft tissue abnormality. Telemetry leads overlie the chest. IMPRESSION: 1. Mild cardiomegaly with central pulmonary vascular congestion with features of mild interstitial edema. Electronically Signed   By: Lovena Le  M.D.   On: 12/25/2019 23:45    EKG: Independently reviewed.  Shows A. fib with RVR with a rate of 127.  Assessment/Plan Principal Problem:   Rapid atrial fibrillation (HCC) Active Problems:   CAD, ARTERY BYPASS GRAFT   Long term (current) use of anticoagulants   Hyperlipidemia   Diabetes mellitus type II, controlled ([place])   Benign essential hypertension     #1 atrial fibrillation with rapid ventricular response: Patient will be admitted to stepdown unit.  Continue with Cardizem drip.  Resume his amiodarone and other cardiac medications.  Already on warfarin and will continue.  Cardiology consult in the morning.  #2 diabetes: Sliding scale insulin with home regimen.  #3 benign essential hypertension: Continue home regimen for rate control as well as blood pressure control.  #4 hyperlipidemia: Continue statin.  #5 coronary artery disease: Enzymes so far negative.  Probably not acute coronary syndrome.-Cardiac cath about 4 months ago with stenting.  Continue Plavix.   DVT prophylaxis: Warfarin Code Status: Full code Family Communication: No family at bedside Disposition Plan: Home Consults called: Cardiology consult in the morning Admission status: Inpatient  Severity of Illness: The appropriate patient status for this patient is INPATIENT. Inpatient status is judged to be reasonable and necessary in order to provide the required intensity of service to ensure the patient's safety. [name] presenting symptoms, physical exam findings, and initial radiographic and laboratory data in the context of their chronic comorbidities is felt to place them at high risk for further clinical deterioration. Furthermore, it is not anticipated that the patient will be medically stable for discharge from the hospital within 2 midnights of admission. The following factors support the patient status of inpatient.   " [name] presenting symptoms include palpitations and chest  discomfort. " The worrisome physical exam findings include irregularly irregular with tachycardia heart rate. " The initial radiographic and laboratory data are worrisome because of no significant finding. " The chronic co-morbidities include history of coronary artery disease.   * I certify that at the point of admission it is my clinical judgment that the patient will require inpatient hospital care spanning beyond 2 midnights from the point of admission due to high intensity of service, high risk for further deterioration and high frequency of surveillance required.Barbette Merino MD [facility] Pager [phone]  If 7PM-7AM, please contact night-coverage [email or web address] Password [facility]  12/26/2019, 12:35 AM

## 2019-12-26 NOTE — Progress Notes (Signed)
*  PRELIMINARY RESULTS* Echocardiogram 2D Echocardiogram has been performed.  Seth Hardy 12/26/2019, 3:21 PM

## 2019-12-26 NOTE — Progress Notes (Signed)
ANTICOAGULATION CONSULT NOTE - Initial Consult  Pharmacy Consult for Warfarin  Indication: atrial fibrillation  No Known Allergies  Patient Measurements: Height: 5\' 8"  (172.7 cm) Weight: 81.6 kg (180 lb) IBW/kg (Calculated) : 68.4 Heparin Dosing Weight:   Vital Signs: Temp: 98.6 F (37 C) (08/03 0025) Temp Source: Oral (08/03 0025) BP: 126/49 (08/03 0030) Pulse Rate: 52 (08/03 0030)  Labs: Recent Labs    12/25/19 2323  HGB 10.5*  HCT 33.0*  PLT 182  LABPROT 20.2*  INR 1.8*  CREATININE 1.33*  TROPONINIHS 15    Estimated Creatinine Clearance: 47.9 mL/min (A) (by C-G formula based on SCr of 1.33 mg/dL (H)).   Medical History: Past Medical History:  Diagnosis Date  . Anemia 08/2015   received 2 units rbc one week ago, 3 IV iron infusion -last 1 week.  . Basal cell carcinoma of skin 2012   removed several spots from arms and back  . CAD (coronary artery disease)    a. 01/2010 CABG x 4: LIMA->LAD, VG->D1, VG->OM1, VG->PDA. b. NSTEMI 02/2012 in setting of AF-RVR with cath s/p BMS to RCA (plan for 1 month, possibly up to 3 months of Plavix)  . Carotid disease, bilateral ([place])    a. 03/2011 U/S: 40-59% bilat Carotid dzs;  b. 04/2015 Carotid U/S: 40-59% bilat ICA stenosis; c. 05/2015 CTA Neck: 00% RICA, 86% LICA, mod-marked R vertebral stenosis, mod L vertebral stenosis.  . Chronic kidney disease    small obtusion per pcp. ultrasound done on 08/29/2015  . Diabetes mellitus type II, controlled ([place])    a. Variable CBG 02/2012 - several meds adjusted.  Marland Kitchen Dysrhythmia    intermittent Atrial Fibrillation  . GERD (gastroesophageal reflux disease)   . Heart murmur   . Hyperlipidemia   . Hypertension   . Hypertensive heart disease   . Iron deficiency anemia    "gets infusions q once in awhile" (04/27/2018)  . Macular edema bil   lazer work done previously  . Mild aortic stenosis    a. 03/2011 Echo: EF 55-60%, Mild AS. b. Mild by cath 02/2012; c. 01/2014 Echo: EF 65-70%, Gr 1 DD,  mild AS, mildly dil LA.  . Multiple gastric ulcers 2006  . Myocardial infarction ([place]) 02/2012   stents (x1) at time  . Myocardial infarction [facility])    "2nd one was after 02/2012; don't know date" (04/27/2018)  . Obesity   . On home oxygen therapy    "2.5 w/CPAP" (04/27/2018)  . OSA on CPAP    CPAP settings 3 with oxygen 2.5 (04/27/2018)  . PAF (paroxysmal atrial fibrillation) ([place])    a. Newly dx 02/2012 & initiated on Coumadin (spont converted to NSR).  . Shortness of breath dyspnea   . Transfusion history    transfusions -1 month ago -2 units    Medications:  (Not in a hospital admission)   Assessment: Pharmacy consulted to dose warfarin in this 74 year old male with rapid afib.  Pt was on warfarin 2.5 mg PO every Tues and Sat and warfarin 5 mg PO Q Sun, Mon, Wed, Thurs, Fri  8/2:  INR @ 2323 = 1.8 Last dose of warfarin was 8/2 @ ~ 20:00  Goal of Therapy:  INR 2-3 Monitor platelets by anticoagulation protocol: Yes   Plan:  Will give additional Warfarin 5 mg PO X 1 on 8/3 @ ~ 0100. Will recheck INR on 8/3 with AM labs.    Jupiter Boys D 12/26/2019,1:12 AM

## 2019-12-26 NOTE — ED Notes (Signed)
Pt O2 sat noted at 86% with a good pleth wave. Pt given 2L O2 via nasal cannula.

## 2019-12-26 NOTE — Progress Notes (Signed)
Seth Hardy for Warfarin  Indication: atrial fibrillation  No Known Allergies  Patient Measurements: Height: 5\' 8"  (172.7 cm) Weight: 81.6 kg (180 lb) IBW/kg (Calculated) : 68.4 Heparin Dosing Weight:   Vital Signs: Temp: 98.6 F (37 C) (08/03 0025) Temp Source: Oral (08/03 0025) BP: 140/52 (08/03 0906) Pulse Rate: 45 (08/03 0906)  Labs: Recent Labs    12/25/19 2323 12/26/19 0126 12/26/19 0715 12/26/19 0813  HGB 10.5*  --   --  10.1*  HCT 33.0*  --   --  30.6*  PLT 182  --   --  182  LABPROT 20.2*  --  22.9*  --   INR 1.8*  --  2.1*  --   CREATININE 1.33*  --   --  1.29*  TROPONINIHS 15 102* 754*  --     Estimated Creatinine Clearance: 49.3 mL/min (A) (by C-G formula based on SCr of 1.29 mg/dL (H)).   Medical History: Past Medical History:  Diagnosis Date  . Anemia 08/2015   received 2 units rbc one week ago, 3 IV iron infusion -last 1 week.  . Basal cell carcinoma of skin 2012   removed several spots from arms and back  . CAD (coronary artery disease)    a. 01/2010 CABG x 4: LIMA->LAD, VG->D1, VG->OM1, VG->PDA. b. NSTEMI 02/2012 in setting of AF-RVR with cath s/p BMS to RCA (plan for 1 month, possibly up to 3 months of Plavix)  . Carotid disease, bilateral ([place])    a. 03/2011 U/S: 40-59% bilat Carotid dzs;  b. 04/2015 Carotid U/S: 40-59% bilat ICA stenosis; c. 05/2015 CTA Neck: 62% RICA, 95% LICA, mod-marked R vertebral stenosis, mod L vertebral stenosis.  . Chronic kidney disease    small obtusion per pcp. ultrasound done on 08/29/2015  . Diabetes mellitus type II, controlled ([place])    a. Variable CBG 02/2012 - several meds adjusted.  Marland Kitchen Dysrhythmia    intermittent Atrial Fibrillation  . GERD (gastroesophageal reflux disease)   . Heart murmur   . Hyperlipidemia   . Hypertension   . Hypertensive heart disease   . Iron deficiency anemia    "gets infusions q once in awhile" (04/27/2018)  . Macular edema bil   lazer work done  previously  . Mild aortic stenosis    a. 03/2011 Echo: EF 55-60%, Mild AS. b. Mild by cath 02/2012; c. 01/2014 Echo: EF 65-70%, Gr 1 DD, mild AS, mildly dil LA.  . Multiple gastric ulcers 2006  . Myocardial infarction ([place]) 02/2012   stents (x1) at time  . Myocardial infarction [facility])    "2nd one was after 02/2012; don't know date" (04/27/2018)  . Obesity   . On home oxygen therapy    "2.5 w/CPAP" (04/27/2018)  . OSA on CPAP    CPAP settings 3 with oxygen 2.5 (04/27/2018)  . PAF (paroxysmal atrial fibrillation) ([place])    a. Newly dx 02/2012 & initiated on Coumadin (spont converted to NSR).  . Shortness of breath dyspnea   . Transfusion history    transfusions -1 month ago -2 units    Medications:  (Not in a hospital admission)   Assessment: Pharmacy consulted to dose warfarin in this 74 year old male with rapid afib.  Pt was on warfarin 2.5 mg PO every Tues and Sat and warfarin 5 mg PO Q Sun, Mon, Wed, Thurs, Fri Last dose of warfarin was 8/2 @ ~ 20:00  8/2  INR 1.8 @ 2323  (took ?5  mg PTA at 1945)   8/3 INR 2.1  Warfarin 5 mg given at 0117   Goal of Therapy:  INR 2-3 Monitor platelets by anticoagulation protocol: Yes   Plan:  Warfarin to be held and start Heparin drip when INR< 2.0- in case of need for cardiac cath (see Cardiology note 8/3). DDI: amiodarone (on PTA). Note Dose was increased by Cardiology 8/3 Will f/u INR  with AM labs.    Cerena Baine A 12/26/2019,9:16 AM

## 2019-12-26 NOTE — ED Notes (Signed)
Resumed care from [place] rn.  Pt alert.  Family with pt.  Pt sitting in a recliner.  Iv in place.

## 2019-12-26 NOTE — Consult Note (Signed)
Cardiology Consultation:   Patient ID: MALIQUE DRISKILL MRN: [id number]; DOB: 01-May-1946  Admit date: 12/25/2019 Date of Consult: 12/26/2019  Primary Care Provider: Rusty Aus, MD [facility] Cardiologist: Kathlyn Sacramento, MD [facility] Electrophysiologist:  None   Patient Profile:   GINA COSTILLA is a 74 y.o. male with a hx of coronary artery disease status post CABG (01/2011) status post multiple PCI's to the RCA (most recently this spring in [place], [place]), carotid artery stenosis status post left carotid endarterectomy (2017), hypertension, hyperlipidemia, type 2 diabetes mellitus, obstructive sleep apnea, chronic iron deficiency anemia, who is being seen today for the evaluation of atrial fibrillation with rapid ventricular response and elevated troponin at the request of Zhang.  History of Present Illness:   Mr. Mcpartland reports that around 10:30 last night, he had sudden onset of palpitations with a rapid heartbeat.  He subsequently developed tightness in his chest for which he took sublingual nitroglycerin with some improvement.  He subsequently presented to the emergency department and was found to be in atrial fibrillation with rapid ventricular response.  He was placed on a diltiazem infusion with subsequent conversion to sinus bradycardia at 00:27.  Shortly after conversion to sinus rhythm, chest pain abated.  He notes that the chest pain is similar to what he experienced in the past leading up to his CABG and stent placements.  Mr. Mciver was otherwise without symptoms, including shortness of breath, lightheadedness, edema, and orthopnea.  Over the last 3 days, Mr. Dase has experienced intermittent loose stools.  He has had some diarrhea in the past, which typically responds well to Imodium.  However, he did not respond this time.  He denies melena and hematochezia, as well as fevers and chills.  He had recently traveled to [place] but denies any sick contacts.   He has received his COVID-19 vaccine.   Past Medical History:  Diagnosis Date   Anemia 08/2015   received 2 units rbc one week ago, 3 IV iron infusion -last 1 week.   Basal cell carcinoma of skin 2012   removed several spots from arms and back   CAD (coronary artery disease)    a. 01/2010 CABG x 4: LIMA->LAD, VG->D1, VG->OM1, VG->PDA. b. NSTEMI 02/2012 in setting of AF-RVR with cath s/p BMS to RCA (plan for 1 month, possibly up to 3 months of Plavix)   Carotid disease, bilateral ([place])    a. 03/2011 U/S: 40-59% bilat Carotid dzs;  b. 04/2015 Carotid U/S: 40-59% bilat ICA stenosis; c. 05/2015 CTA Neck: 78% RICA, 29% LICA, mod-marked R vertebral stenosis, mod L vertebral stenosis.   Chronic kidney disease    small obtusion per pcp. ultrasound done on 08/29/2015   Diabetes mellitus type II, controlled ([place])    a. Variable CBG 02/2012 - several meds adjusted.   Dysrhythmia    intermittent Atrial Fibrillation   GERD (gastroesophageal reflux disease)    Heart murmur    Hyperlipidemia    Hypertension    Hypertensive heart disease    Iron deficiency anemia    "gets infusions q once in awhile" (04/27/2018)   Macular edema bil   lazer work done previously   Mild aortic stenosis    a. 03/2011 Echo: EF 55-60%, Mild AS. b. Mild by cath 02/2012; c. 01/2014 Echo: EF 65-70%, Gr 1 DD, mild AS, mildly dil LA.   Multiple gastric ulcers 2006   Myocardial infarction [facility]) 02/2012   stents (x1) at time   Myocardial infarction [facility])    "  2nd one was after 02/2012; don't know date" (04/27/2018)   Obesity    On home oxygen therapy    "2.5 w/CPAP" (04/27/2018)   OSA on CPAP    CPAP settings 3 with oxygen 2.5 (04/27/2018)   PAF (paroxysmal atrial fibrillation) ([place])    a. Newly dx 02/2012 & initiated on Coumadin (spont converted to NSR).   Shortness of breath dyspnea    Transfusion history    transfusions -1 month ago -2 units    Past Surgical History:  Procedure Laterality Date   ANGIOPLASTY     BASAL  CELL CARCINOMA EXCISION     "shoulder, arm X 2"   CARDIAC CATHETERIZATION  2014   CARDIAC CATHETERIZATION  2021   [facility] x1 stent   CATARACT EXTRACTION W/ INTRAOCULAR LENS  IMPLANT, BILATERAL Bilateral 2016   COLONOSCOPY WITH PROPOFOL N/A 10/15/2015   Procedure: COLONOSCOPY WITH PROPOFOL;  Surgeon: Gatha Mayer, MD;  Location: [facility];  Service: Endoscopy;  Laterality: N/A;   CORONARY ANGIOPLASTY     Dr. Fletcher Anon   CORONARY ARTERY BYPASS GRAFT  2011   CABG X3-4; [facility], [place], [place],    CORONARY ATHERECTOMY  04/27/2018   CORONARY ATHERECTOMY N/A 04/27/2018   Procedure: CORONARY ATHERECTOMY;  Surgeon: Belva Crome, MD;  Location: [place] CV LAB;  Service: Cardiovascular;  Laterality: N/A;   cyst L kidney  [place] Left 08/30/2015   Procedure: ENDARTERECTOMY CAROTID;  Surgeon: Katha Cabal, MD;  Location: [facility] ORS;  Service: Vascular;  Laterality: Left;   ESOPHAGOGASTRODUODENOSCOPY (EGD) WITH PROPOFOL N/A 10/15/2015   Procedure: ESOPHAGOGASTRODUODENOSCOPY (EGD) WITH PROPOFOL;  Surgeon: Gatha Mayer, MD;  Location: [facility];  Service: Endoscopy;  Laterality: N/A;   EYE SURGERY Bilateral    "has macular edema cauterized" (04/27/2018)   INGUINAL HERNIA REPAIR Right    LEFT HEART CATHETERIZATION WITH CORONARY ANGIOGRAM N/A 03/09/2012   Procedure: LEFT HEART CATHETERIZATION WITH CORONARY ANGIOGRAM;  Surgeon: Wellington Hampshire, MD;  Location: [place] CATH LAB;  Service: Cardiovascular;  Laterality: N/A;   PERCUTANEOUS CORONARY STENT INTERVENTION (PCI-S)  03/09/2012   Procedure: PERCUTANEOUS CORONARY STENT INTERVENTION (PCI-S);  Surgeon: Wellington Hampshire, MD;  Location: [facility] CATH LAB;  Service: Cardiovascular;;   RIGHT/LEFT HEART CATH AND CORONARY ANGIOGRAPHY N/A 04/25/2018   Procedure: RIGHT/LEFT HEART CATH AND CORONARY ANGIOGRAPHY;  Surgeon: Wellington Hampshire, MD;  Location: [place] CV LAB;  Service: Cardiovascular;  Laterality:  N/A;     Home Medications:  Prior to Admission medications   Medication Sig Start Date [place] Tipping Date Taking? Authorizing Provider  acetaminophen (TYLENOL) 500 MG tablet Take 1,000 mg by mouth every 6 (six) hours as needed for moderate pain or headache.   Yes [provider]  ALPRAZolam (XANAX) 0.25 MG tablet Take 0.25 mg by mouth daily as needed for anxiety.   Yes [provider]  amiodarone (PACERONE) 200 MG tablet TAKE 1/2 TABLET AT BEDTIME 01/07/18  Yes Wellington Hampshire, MD  amLODipine (NORVASC) 5 MG tablet Take one tablet by mouth one time daily 07/25/14  Yes Wellington Hampshire, MD  clopidogrel (PLAVIX) 75 MG tablet TAKE 1 TABLET BY MOUTH DAILY 07/19/19  Yes Wellington Hampshire, MD  Cyanocobalamin (B-12) 1000 MCG CAPS Take 1,000 mcg by mouth every evening.    Yes [provider]  Ferrous Gluconate-C-Folic Acid (IRON-C PO) Take 1 tablet by mouth daily.   Yes [provider]  furosemide (LASIX) 20  MG tablet Take 1 tablet (20 mg total) by mouth daily. Patient taking differently: Take 20 mg by mouth daily as needed.  04/25/18  Yes Wellington Hampshire, MD  glimepiride (AMARYL) 4 MG tablet Take 4 mg by mouth daily.    Yes [provider]  insulin glargine (LANTUS) 100 UNIT/ML injection Inject 15 Units into the skin daily.    Yes [provider]  NOVOLOG FLEXPEN 100 UNIT/ML FlexPen Inject 6-36 Units into the skin 2 (two) times daily with a meal.  05/26/17  Yes [provider]  omega-3 acid ethyl esters (LOVAZA) 1 g capsule Take 1 capsule (1 g total) by mouth daily. 04/28/18  Yes Kathyrn Drown D, NP  Omega-3 Fatty Acids (FISH OIL) 1000 MG CAPS Take 1,000 mg by mouth every evening.   Yes [provider]  OXYGEN Inhale 2.5 L/min into the lungs at bedtime. With CPAP   Yes [provider]  pantoprazole (PROTONIX) 40 MG tablet Take 1 tablet (40 mg total) by mouth daily. 06/11/14  Yes Wellington Hampshire, MD  Polyethylene Glycol 400 (BLINK  TEARS OP) Place 1 drop into both eyes daily as needed (dry eyes).   Yes [provider]  ramipril (ALTACE) 10 MG capsule Take 10 mg by mouth daily.  09/17/15  Yes [provider]  rosuvastatin (CRESTOR) 40 MG tablet TAKE ONE TABLET BY MOUTH EVERY DAY 06/05/19  Yes Wellington Hampshire, MD  sucralfate (CARAFATE) 1 g tablet Take 1 g by mouth 2 (two) times daily. 08/23/19 08/22/20 Yes [provider]  vitamin C (ASCORBIC ACID) 500 MG tablet Take 500 mg by mouth every evening.   Yes [provider]  warfarin (COUMADIN) 5 MG tablet Take 2.5-5 mg by mouth See admin instructions. Take 5 mg at night on Sun, Mon, Wed, Thur, and Fri.  Take 2.5 mg at night on Tues and Sat 07/17/16  Yes [provider]  nitroGLYCERIN (NITROSTAT) 0.4 MG SL tablet PLACE 1 TABLET UNDER THE TONGUE EVERY 5 MINUTES FOR UP TO 3 DOSES AS NEEDED FOR CHEST PAINS, IF NO RELIEF CALL 911 12/15/18   Wellington Hampshire, MD  tadalafil (CIALIS) 10 MG tablet Take 10 mg by mouth as needed. 08/23/19 09/22/19  [provider]  zinc gluconate 50 MG tablet Take 50 mg by mouth daily. Patient not taking: Reported on 12/26/2019    [provider]    Inpatient Medications: Scheduled Meds:  insulin aspart  0-15 Units Subcutaneous TID WC   insulin aspart  0-5 Units Subcutaneous QHS   ondansetron (ZOFRAN) IV  4 mg Intravenous Once   Continuous Infusions:   PRN Meds: acetaminophen, ondansetron (ZOFRAN) IV  Allergies:   No Known Allergies  Social History:   Social History   Tobacco Use   Smoking status: Never Smoker   Smokeless tobacco: Never Used   Tobacco comment: tobacco use - no.no passive smoke in home  Vaping Use   Vaping Use: Never used  Substance Use Topics   Alcohol use: Yes    Comment: 04/27/2018 "couple drinks/year; if that"   Drug use: Never     Family History:   Family History  Problem Relation Age of Onset   COPD Mother        alive 55   Bladder Cancer Mother    Heart  attack Father        67 deceased   Lung cancer Maternal Aunt    Liver cancer Maternal Aunt    Kidney  disease Paternal Uncle    Cancer Other        all paternal aunts and uncles   Prostate cancer Neg Hx    Kidney cancer Neg Hx      ROS:  Please see the history of present illness. All other ROS reviewed and negative.     Physical Exam/Data:   Vitals:   12/26/19 0230 12/26/19 0300 12/26/19 0430 12/26/19 0630  BP: (!) 135/53 (!) 137/55 (!) 130/57 (!) 145/50  Pulse: (!) 41 (!) 45 (!) 42 (!) 45  Resp: 14 15 15 15   Temp:      TempSrc:      SpO2: 93% 93% 91% 95%  Weight:      Height:        Intake/Output Summary (Last 24 hours) at 12/26/2019 0856 Last data filed at 12/26/2019 0048 Gross per 24 hour  Intake 25.88 ml  Output --  Net 25.88 ml   Last 3 Weights 12/25/2019 11/10/2019 09/26/2019  Weight (lbs) 180 lb 184 lb 6.4 oz 182 lb 8 oz  Weight (kg) 81.647 kg 83.643 kg 82.781 kg     Body mass index is 27.37 kg/m.  General:  Well nourished, well developed, in no acute distress.  He is accompanied by his wife. HEENT: normal Lymph: no adenopathy Neck: no JVD Endocrine:  No thryomegaly Vascular: No carotid bruits; 2+ radial pulses bilaterally. Cardiac: Bradycardic but regular with 3/6 systolic murmur and absent S2. Lungs:  clear to auscultation bilaterally, no wheezing, rhonchi or rales  Abd: soft, nontender, no hepatomegaly  Ext: Trace pretibial edema bilaterally. Musculoskeletal:  No deformities, BUE and BLE strength normal and equal Skin: warm and dry  Neuro:  CNs 2-12 intact, no focal abnormalities noted Psych:  Normal affect   EKG:  The EKG was personally reviewed and demonstrates: Atrial fibrillation with rapid ventricular response and diffuse ST depression. Telemetry:  Telemetry was personally reviewed and demonstrates: Atrial fibrillation with rapid ventricular response with conversion to sinus bradycardia at 00:27.  Relevant CV Studies: TTE (07/26/19):  1. Left  ventricular ejection fraction, by estimation, is 60 to 65%. The  left ventricle has normal function. The left ventricle has no regional  wall motion abnormalities. There is mild left ventricular hypertrophy.  Left ventricular diastolic parameters  are consistent with Grade II diastolic dysfunction (pseudonormalization).   2. Right ventricular systolic function is normal. The right ventricular  size is normal.   3. Left atrial size was severely dilated.   4. The mitral valve is grossly normal. No evidence of mitral valve  regurgitation.   5. Heavily calcified aortic valve. DVI = 0.35, peak gradient 18mmHg, mean  gradient 9mmHg, AVA 1.35cm2. The aortic valve is tricuspid. Aortic valve  regurgitation is not visualized. Moderate aortic valve stenosis.   6. The inferior vena cava is normal in size with greater than 50%  respiratory variability, suggesting right atrial pressure of 3 mmHg.   Laboratory Data:  High Sensitivity Troponin:   Recent Labs  Lab 12/25/19 2323 12/26/19 0126 12/26/19 0715  TROPONINIHS 15 102* 754*     Chemistry Recent Labs  Lab 12/25/19 2323  NA 140  K 3.7  CL 106  CO2 23  GLUCOSE 258*  BUN 17  CREATININE 1.33*  CALCIUM 8.7*  GFRNONAA 53*  GFRAA >60  ANIONGAP 11    No results for input(s): PROT, ALBUMIN, AST, ALT, ALKPHOS, BILITOT in the last 168 hours. Hematology Recent Labs  Lab 12/25/19 2323 12/26/19 0813  WBC 4.1 4.3  RBC 3.17* 3.01*  HGB 10.5* 10.1*  HCT 33.0* 30.6*  MCV 104.1* 101.7*  MCH 33.1 33.6  MCHC 31.8 33.0  RDW 14.2 14.3  PLT 182 182   BNPNo results for input(s): BNP, PROBNP in the last 168 hours.  DDimer No results for input(s): DDIMER in the last 168 hours.   Radiology/Studies:  DG Chest Portable 1 View  Result Date: 12/25/2019 CLINICAL DATA:  Chest pain EXAM: PORTABLE CHEST 1 VIEW COMPARISON:  CT 08/20/2015, radiograph 07/25/2015 FINDINGS: Postsurgical changes related to prior CABG including intact and aligned  sternotomy wires and multiple surgical clips projecting over the mediastinum. Mild cardiomegaly is similar to prior accounting for the portable technique. The aorta is calcified. The remaining cardiomediastinal contours are unremarkable. Central pulmonary vascular congestion is noted. There are few peripheral septal lines as well as some hazy bibasilar opacity. No pneumothorax or visible effusion. No focal consolidative opacity is seen. No acute osseous or soft tissue abnormality. Telemetry leads overlie the chest. IMPRESSION: 1. Mild cardiomegaly with central pulmonary vascular congestion with features of mild interstitial edema. Electronically Signed   By: Lovena Le M.D.   On: 12/25/2019 23:45   TIMI Risk Score for Unstable Angina or Non-ST Elevation MI:   [name] TIMI risk score is 5, which indicates a 26% risk of all cause mortality, new or recurrent myocardial infarction or need for urgent revascularization in the next 14 days.    Assessment and Plan:   Atrial fibrillation with rapid ventricular response: Mr. Attridge has a history of persistent atrial fibrillation but has been maintaining sinus rhythm on low-dose amiodarone.  He has also been on anticoagulation with warfarin, though INR at the time of presentation last night was slightly subtherapeutic at 1.8.  He has converted to sinus bradycardia and is currently asymptomatic. Increase amiodarone to 200 mg twice daily with close monitoring of heart rate for gentle reloading. Hold warfarin in the setting of rising troponin, in case invasive procedures are necessary.  Heparin infusion should be started once INR has drifted below 2. AV nodal blocking agents precluded in the setting of baseline bradycardia. Patient may benefit from EP consultation (either during this admission if there is recurrent atrial fibrillation with rapid ventricular response or as an outpatient to discuss other rhythm control strategies versus pacemaker placement  in the setting of tachy-brady syndrome.  Demand ischemia: I suspect elevated troponin is most likely due to supply-demand mismatch in the setting of fixed coronary artery disease and atrial fibrillation with rapid ventricular response.  Troponin continues to trend up at this time. Trend high-sensitivity troponin I until it has peaked, then stop. Continue clopidogrel.  Defer adding aspirin in the setting of clopidogrel and warfarin use as well as chronic iron deficiency anemia. Obtain transthoracic echocardiogram.  If there is evidence of a new regional wall motion abnormality, cardiac catheterization will need to be considered during this admission.  I will make Mr. Bradway n.p.o. after midnight in case procedures are necessary tomorrow.  I will also review his case with Dr. Fletcher Anon, given Mr. Rod complex cardiac history and recent PCI to in-stent restenosis of the distal RCA in late April/early May in [place].  Moderate aortic stenosis: Echos as recently as 07/2019 showed moderate aortic stenosis.  Absent S2 noted on exam today.  Repeat echo currently pending. Follow-up repeat echocardiogram.  Chronic HFpEF: Mr. Dibert appears mildly volume overloaded with trace pretibial edema as well as mild vascular congestion on chest radiograph. Continue furosemide 20 mg p.o.  daily.  Dose escalation may need to be considered based on urine output.  Chronic iron deficiency anemia: Hemoglobin stable at baseline. Antiplatelet therapy and anticoagulation, as outlined above. Continue iron infusions per hematology.  Diarrhea: Question if this precipitated atrial fibrillation. Ongoing work-up/management per internal medicine.    For questions or updates, please contact [place] Please consult [email or web address] for contact info under [facility] cardiology.  Signed, Nelva Bush, MD  12/26/2019 8:56 AM

## 2019-12-27 DIAGNOSIS — I2510 Atherosclerotic heart disease of native coronary artery without angina pectoris: Secondary | ICD-10-CM

## 2019-12-27 DIAGNOSIS — I4891 Unspecified atrial fibrillation: Secondary | ICD-10-CM

## 2019-12-27 LAB — BASIC METABOLIC PANEL
Anion gap: 8 (ref 5–15)
BUN: 20 mg/dL (ref 8–23)
CO2: 25 mmol/L (ref 22–32)
Calcium: 8.1 mg/dL — ABNORMAL LOW (ref 8.9–10.3)
Chloride: 105 mmol/L (ref 98–111)
Creatinine, Ser: 1.33 mg/dL — ABNORMAL HIGH (ref 0.61–1.24)
GFR calc Af Amer: >60 mL/min (ref >60)
GFR calc non Af Amer: 53 mL/min — ABNORMAL LOW (ref >60)
Glucose, Bld: 244 mg/dL — ABNORMAL HIGH (ref 70–99)
Potassium: 3.9 mmol/L (ref 3.5–5.1)
Sodium: 138 mmol/L (ref 135–145)

## 2019-12-27 LAB — PROTIME-INR
INR: 3 — ABNORMAL HIGH (ref 0.8–1.2)
Prothrombin Time: 30.4 seconds — ABNORMAL HIGH (ref 11.4–15.2)

## 2019-12-27 LAB — GLUCOSE, CAPILLARY
Glucose-Capillary: 171 mg/dL — ABNORMAL HIGH (ref 70–99)
Glucose-Capillary: 351 mg/dL — ABNORMAL HIGH (ref 70–99)
Glucose-Capillary: 131 mg/dL — ABNORMAL HIGH (ref 70–99)
Glucose-Capillary: 232 mg/dL — ABNORMAL HIGH (ref 70–99)

## 2019-12-27 LAB — CBC WITH DIFFERENTIAL/PLATELET
Abs Immature Granulocytes: 0.01 10*3/uL (ref 0.00–0.07)
Basophils Absolute: 0 10*3/uL (ref 0.0–0.1)
Basophils Relative: 0 %
Eosinophils Absolute: 0.2 10*3/uL (ref 0.0–0.5)
Eosinophils Relative: 5 %
HCT: 27.3 % — ABNORMAL LOW (ref 39.0–52.0)
Hemoglobin: 8.8 g/dL — ABNORMAL LOW (ref 13.0–17.0)
Immature Granulocytes: 0 %
Lymphocytes Relative: 17 %
Lymphs Abs: 0.7 10*3/uL (ref 0.7–4.0)
MCH: 33.2 pg (ref 26.0–34.0)
MCHC: 32.2 g/dL (ref 30.0–36.0)
MCV: 103 fL — ABNORMAL HIGH (ref 80.0–100.0)
Monocytes Absolute: 0.5 10*3/uL (ref 0.1–1.0)
Monocytes Relative: 11 %
Neutro Abs: 2.8 10*3/uL (ref 1.7–7.7)
Neutrophils Relative %: 67 %
Platelets: 165 10*3/uL (ref 150–400)
RBC: 2.65 MIL/uL — ABNORMAL LOW (ref 4.22–5.81)
RDW: 13.9 % (ref 11.5–15.5)
WBC: 4.2 10*3/uL (ref 4.0–10.5)
nRBC: 0 % (ref 0.0–0.2)

## 2019-12-27 LAB — TROPONIN I (HIGH SENSITIVITY): Troponin I (High Sensitivity): 499 ng/L (ref <18)

## 2019-12-27 LAB — MAGNESIUM: Magnesium: 2.2 mg/dL (ref 1.7–2.4)

## 2019-12-27 MED ORDER — AMIODARONE LOAD VIA INFUSION
150.0000 mg | Freq: Once | INTRAVENOUS | Status: AC
  Administered 2019-12-27: 150 mg via INTRAVENOUS
  Filled 2019-12-27: qty 83.34

## 2019-12-27 MED ORDER — AMIODARONE HCL IN DEXTROSE 360-4.14 MG/200ML-% IV SOLN
60.0000 mg/h | INTRAVENOUS | Status: AC
  Administered 2019-12-27: 30 mg/h via INTRAVENOUS
  Filled 2019-12-27 (×2): qty 200

## 2019-12-27 MED ORDER — AMIODARONE HCL IN DEXTROSE 360-4.14 MG/200ML-% IV SOLN
30.0000 mg/h | INTRAVENOUS | Status: DC
  Administered 2019-12-27 (×2): 30 mg/h via INTRAVENOUS
  Filled 2019-12-27: qty 200

## 2019-12-27 NOTE — Progress Notes (Signed)
Pt ambulated to the BR and once returning to bed complained of palpations. Pt converted from SB to uncontrolled a-fib HR 110-140. Vitals obtained. MD notified.

## 2019-12-27 NOTE — Progress Notes (Signed)
Pt in sinus brady HR sustaining 50. Dr.Agbor-Etang notified. Order to change amio gtt to 0.5mg /min.

## 2019-12-27 NOTE — Progress Notes (Signed)
Inpatient Diabetes Program Recommendations  AACE/ADA: New Consensus Statement on Inpatient Glycemic Control (2015)  Target Ranges:  Prepandial:   less than 140 mg/dL      Peak postprandial:   less than 180 mg/dL (1-2 hours)      Critically ill patients:  140 - 180 mg/dL   Lab Results  Component Value Date   GLUCAP 171 (H) 12/27/2019   HGBA1C 5.7 (H) 12/26/2019    Results for [name], Seth Hardy (MRN [id number]) as of 12/27/2019 10:12  Ref. Range 12/26/2019 08:19 12/26/2019 11:52 12/26/2019 17:56 12/26/2019 21:13 12/27/2019 07:55  Glucose-Capillary Latest Ref Range: 70 - 99 mg/dL 145 (H) 200 (H) 243 (H) 236 (H) 171 (H)  Review of Glycemic Control  Diabetes history: DM 2 Outpatient Diabetes medications: Glimepiride 4 mg Daily, Lantus 15 units Daily, Novolog 6-36 units bid with meals Current orders for Inpatient glycemic control:  Lantus 15 units Daily Novolog 0-15 units tid + hs  Inpatient Diabetes Program Recommendations:    -  Consider increasing Lantus to 18 units  -  May also consider adding Novolog 3 units tid meal coverage if eating >50% of meals.   Thanks,  Tama Headings RN, MSN, BC-ADM Inpatient Diabetes Coordinator Team Pager [phone] (8a-5p)

## 2019-12-27 NOTE — Progress Notes (Signed)
[place] for Warfarin  Indication: atrial fibrillation  No Known Allergies  Patient Measurements: Height: 5\' 8"  (172.7 cm) Weight: 82.6 kg (182 lb 3.2 oz) IBW/kg (Calculated) : 68.4 Heparin Dosing Weight:   Vital Signs: Temp: 98.6 F (37 C) (08/04 0709) Temp Source: Oral (08/04 0709) BP: 163/96 (08/04 0709) Pulse Rate: 132 (08/04 0709)  Labs: Recent Labs    12/25/19 2323 12/25/19 2323 12/26/19 0126 12/26/19 0715 12/26/19 0813 12/26/19 1738 12/27/19 0008  HGB 10.5*   < >  --   --  10.1*  --  8.8*  HCT 33.0*  --   --   --  30.6*  --  27.3*  PLT 182  --   --   --  182  --  165  LABPROT 20.2*  --   --  22.9*  --   --  30.4*  INR 1.8*  --   --  2.1*  --   --  3.0*  CREATININE 1.33*  --   --   --  1.29*  --  1.33*  TROPONINIHS 15  --    < > 754*  --  809* 499*   < > = values in this interval not displayed.    Estimated Creatinine Clearance: 51.8 mL/min (A) (by C-G formula based on SCr of 1.33 mg/dL (H)).   Medical History: Past Medical History:  Diagnosis Date  . Anemia 08/2015   received 2 units rbc one week ago, 3 IV iron infusion -last 1 week.  . Basal cell carcinoma of skin 2012   removed several spots from arms and back  . CAD (coronary artery disease)    a. 01/2010 CABG x 4: LIMA->LAD, VG->D1, VG->OM1, VG->PDA. b. NSTEMI 02/2012 in setting of AF-RVR with cath s/p BMS to RCA (plan for 1 month, possibly up to 3 months of Plavix)  . Carotid disease, bilateral ([place])    a. 03/2011 U/S: 40-59% bilat Carotid dzs;  b. 04/2015 Carotid U/S: 40-59% bilat ICA stenosis; c. 05/2015 CTA Neck: 01% RICA, 09% LICA, mod-marked R vertebral stenosis, mod L vertebral stenosis.  . Chronic kidney disease    small obtusion per pcp. ultrasound done on 08/29/2015  . Diabetes mellitus type II, controlled ([place])    a. Variable CBG 02/2012 - several meds adjusted.  Marland Kitchen Dysrhythmia    intermittent Atrial Fibrillation  . GERD (gastroesophageal reflux disease)    . Heart murmur   . Hyperlipidemia   . Hypertension   . Hypertensive heart disease   . Iron deficiency anemia    "gets infusions q once in awhile" (04/27/2018)  . Macular edema bil   lazer work done previously  . Mild aortic stenosis    a. 03/2011 Echo: EF 55-60%, Mild AS. b. Mild by cath 02/2012; c. 01/2014 Echo: EF 65-70%, Gr 1 DD, mild AS, mildly dil LA.  . Multiple gastric ulcers 2006  . Myocardial infarction ([place]) 02/2012   stents (x1) at time  . Myocardial infarction [facility])    "2nd one was after 02/2012; don't know date" (04/27/2018)  . Obesity   . On home oxygen therapy    "2.5 w/CPAP" (04/27/2018)  . OSA on CPAP    CPAP settings 3 with oxygen 2.5 (04/27/2018)  . PAF (paroxysmal atrial fibrillation) ([place])    a. Newly dx 02/2012 & initiated on Coumadin (spont converted to NSR).  . Shortness of breath dyspnea   . Transfusion history    transfusions -1 month ago -2  units    Medications:  Medications Prior to Admission  Medication Sig Dispense Refill Last Dose  . acetaminophen (TYLENOL) 500 MG tablet Take 1,000 mg by mouth every 6 (six) hours as needed for moderate pain or headache.   prn at prn  . ALPRAZolam (XANAX) 0.25 MG tablet Take 0.25 mg by mouth daily as needed for anxiety.   prn at prn  . amiodarone (PACERONE) 200 MG tablet TAKE 1/2 TABLET AT BEDTIME 45 tablet 0 12/25/2019 at Unknown time  . amLODipine (NORVASC) 5 MG tablet Take one tablet by mouth one time daily 90 tablet 3 12/25/2019 at Unknown time  . clopidogrel (PLAVIX) 75 MG tablet TAKE 1 TABLET BY MOUTH DAILY 90 tablet 0 12/25/2019 at Unknown time  . Cyanocobalamin (B-12) 1000 MCG CAPS Take 1,000 mcg by mouth every evening.    12/25/2019 at Unknown time  . Ferrous Gluconate-C-Folic Acid (IRON-C PO) Take 1 tablet by mouth daily.   12/25/2019 at Unknown time  . furosemide (LASIX) 20 MG tablet Take 1 tablet (20 mg total) by mouth daily. (Patient taking differently: Take 20 mg by mouth daily as needed. ) 30 tablet 6 prn at prn   . glimepiride (AMARYL) 4 MG tablet Take 4 mg by mouth daily.    12/25/2019 at Unknown time  . insulin glargine (LANTUS) 100 UNIT/ML injection Inject 15 Units into the skin daily.    12/25/2019 at Unknown time  . NOVOLOG FLEXPEN 100 UNIT/ML FlexPen Inject 6-36 Units into the skin 2 (two) times daily with a meal.    12/25/2019 at Unknown time  . omega-3 acid ethyl esters (LOVAZA) 1 g capsule Take 1 capsule (1 g total) by mouth daily. 90 capsule 4 12/25/2019 at Unknown time  . Omega-3 Fatty Acids (FISH OIL) 1000 MG CAPS Take 1,000 mg by mouth every evening.   12/25/2019 at Unknown time  . OXYGEN Inhale 2.5 L/min into the lungs at bedtime. With CPAP   12/25/2019 at Unknown time  . pantoprazole (PROTONIX) 40 MG tablet Take 1 tablet (40 mg total) by mouth daily. 90 tablet 3 12/25/2019 at Unknown time  . Polyethylene Glycol 400 (BLINK TEARS OP) Place 1 drop into both eyes daily as needed (dry eyes).   prn at prn  . ramipril (ALTACE) 10 MG capsule Take 10 mg by mouth daily.    12/25/2019 at Unknown time  . rosuvastatin (CRESTOR) 40 MG tablet TAKE ONE TABLET BY MOUTH EVERY DAY 90 tablet 2 12/25/2019 at Unknown time  . sucralfate (CARAFATE) 1 g tablet Take 1 g by mouth 2 (two) times daily.   12/25/2019 at Unknown time  . vitamin C (ASCORBIC ACID) 500 MG tablet Take 500 mg by mouth every evening.   12/25/2019 at Unknown time  . warfarin (COUMADIN) 5 MG tablet Take 2.5-5 mg by mouth See admin instructions. Take 5 mg at night on Sun, Mon, Wed, Thur, and Fri.  Take 2.5 mg at night on Tues and Sat   12/25/2019 at 1945  . nitroGLYCERIN (NITROSTAT) 0.4 MG SL tablet PLACE 1 TABLET UNDER THE TONGUE EVERY 5 MINUTES FOR UP TO 3 DOSES AS NEEDED FOR CHEST PAINS, IF NO RELIEF CALL 911 25 tablet 0 prn at prn  . tadalafil (CIALIS) 10 MG tablet Take 10 mg by mouth as needed.     . zinc gluconate 50 MG tablet Take 50 mg by mouth daily. (Patient not taking: Reported on 12/26/2019)   Not Taking at Unknown time    Assessment: Pharmacy  consulted to  dose warfarin in this 74 year old male with rapid afib.  Pt was on warfarin 2.5 mg PO every Tues and Sat and warfarin 5 mg PO Q Sun, Mon, Wed, Thurs, Fri Last dose of warfarin was 8/2 @ ~ 20:00  8/2  INR 1.8 @ 2323  (took ?5 mg PTA at [place])    Date INR Warfarin Dose  8/3 2.1 5 mg x 1 @ 0117  8/4 3 HOLD   Hgb trending down 10.1 > 8.8. - continue to monitor.   Goal of Therapy:  INR 2-3 Monitor platelets by anticoagulation protocol: Yes   Plan:  - Warfarin to be held and start Heparin drip when INR< 2.0- in case of need for cardiac cath (see Cardiology note 8/3). - DDI: amiodarone (on PTA). Note Dose was increased by Cardiology 8/3 - may need to decrease home regimen by 50% with increase dose.  - Will f/u INR  with AM labs.    Oswald Hillock, PharmD, BCPS 12/27/2019,7:40 AM

## 2019-12-27 NOTE — Plan of Care (Signed)
  Problem: Education: Goal: Knowledge of General Education information will improve Description Including pain rating scale, medication(s)/side effects and non-pharmacologic comfort measures Outcome: Progressing   Problem: Health Behavior/Discharge Planning: Goal: Ability to manage health-related needs will improve Outcome: Progressing   

## 2019-12-27 NOTE — Progress Notes (Signed)
PROGRESS NOTE    TALBOT MONARCH  KYH:[id number] DOB: November 17, 1945 DOA: 12/25/2019 PCP: Rusty Aus, MD    Chief complaint.  Palpitation.  Brief Narrative: Seth Hardy is a 74 y.o. male with medical history significant of coronary artery disease status post CABG and recent cath, hypertension, diabetes, chronic kidney disease stage III, GERD, hyperlipidemia, iron deficiency anemia and basal cell carcinoma of the skin who is on chronic warfarin therapy on and also on amiodarone at home.  Patient came to the ER today secondary to palpitations and chest discomfort.  He is a placed on diltiazem drip. He is also on night oxygen with chronic hypoxemic respiratory failure.  8/3.  Patient converted to sinus rhythm, diltiazem drip discontinued.  However, patient troponin increased to 754.  Cardiology consult obtained. Patient also has diarrhea for the last 3 days, multiple loose stools.  But no abdominal pain or nausea vomiting.  C. difficile toxin sent. 8/4 Pt seen today for f/u of his a.fib. Pt has been noncompliant with regimen and wife has complaints about his noncompliance affecting his health for past 15 years. Pt denies any depression .Counselled pt extensively on diet and sodium restriction and sugar restriction.  Clinically pt is doing better and cardiology is following as well. He reports some abd pain and diarrhea and c./diff was negative. Pt has been seeing hematology and also receiving iron transfusion, he was told his stomach is leaking and he is bleeding.    Assessment & Plan:   Principal Problem:   Rapid atrial fibrillation (HCC) Active Problems:   CAD, ARTERY BYPASS GRAFT   Long term (current) use of anticoagulants   Hyperlipidemia   Diabetes mellitus type II, controlled ([place])   Benign essential hypertension   NSTEMI (non-ST elevated myocardial infarction) (HCC)   Atrial fibrillation with RVR (HCC)  Paroxysmal atrial fibrillation with rapid ventricular response. Patient  has converted to sinus rhythm.  Diltiazem already taken off.  We will continue anticoagulation for now.  Patient will be evaluated by cardiology, notified Dr. Saunders Revel. Pt on amiodarone drip , with Plan for EP studies.   NSTEMI. Secondary to demand ischemia -No invasive work-up for stress testing planned at this point.  Diarrhea. Check C. difficile toxin.  Type 2 diabetes uncontrolled with hyperglycemia. Well-controlled.  Continue sliding scale insulin.  Essential hypertension. Continues on home medicines.  Chronic hypoxemic respiratory failure. Patient is on night oxygen.  Chronic kidney disease stage IIIa. Reviewed previous lab, renal function still stable.  Anemia: Will follow h/h and transfuse as needed,.  IV ppi.   DVT prophylaxis: warfarin Code Status: Full Family Communication: Wife at bedside. Disposition Plan:  . Patient came from:            . Anticipated d/c place: . Barriers to d/c OR conditions which need to be met to effect a safe d/c:   Consultants:   Cardiology  Procedures: None  Antimicrobials: None  Subjective: Patient still have significant diarrhea, no nausea vomiting.  No abdominal pain. No additional palpitation, no chest pain or discomfort.  No short of breath. No fever or chills.   Objective: Vitals:   12/27/19 0407 12/27/19 0709 12/27/19 0754 12/27/19 1129  BP: (!) 153/57 (!) 163/96 (!) 160/80 (!) 132/48  Pulse: (!) 54 (!) 132 91 (!) 52  Resp:   18 18  Temp: 98.1 F (36.7 C) 98.6 F (37 C) 97.7 F (36.5 C) 98.2 F (36.8 C)  TempSrc: Oral Oral Oral Oral  SpO2: 100% 100% 100%  96%  Weight: 82.6 kg     Height:        Intake/Output Summary (Last 24 hours) at 12/27/2019 1543 Last data filed at 12/27/2019 1131 Gross per 24 hour  Intake 360 ml  Output 400 ml  Net -40 ml   Filed Weights   12/25/19 2320 12/26/19 1735 12/27/19 0407  Weight: 81.6 kg 83 kg 82.6 kg    Examination:  General exam: Appears calm and comfortable    Respiratory system: Decreased breathing sounds with very few crackles in the base bilaterally. Respiratory effort normal. Cardiovascular system: S1 & S2 heard, regular, mild bradycardia. No JVD, murmurs, rubs, gallops or clicks. No pedal edema. Gastrointestinal system: Abdomen is nondistended, soft and nontender. No organomegaly or masses felt. Normal bowel sounds heard. Central nervous system: Alert and oriented. No focal neurological deficits. Extremities: Symmetric 5 x 5 power. Skin: No rashes, lesions or ulcers Psychiatry: Judgement and insight appear normal. Mood & affect appropriate.    Data Reviewed: I have personally reviewed following labs and imaging studies  CBC: Recent Labs  Lab 12/25/19 2323 12/26/19 0813 12/27/19 0008  WBC 4.1 4.3 4.2  NEUTROABS  --   --  2.8  HGB 10.5* 10.1* 8.8*  HCT 33.0* 30.6* 27.3*  MCV 104.1* 101.7* 103.0*  PLT 182 182 397   Basic Metabolic Panel: Recent Labs  Lab 12/25/19 2323 12/26/19 0813 12/27/19 0008  NA 140 140 138  K 3.7 3.6 3.9  CL 106 106 105  CO2 _0 GLUCOSE 258* 168* 244*  BUN _1 CREATININE 1.33* 1.29* 1.33*  CALCIUM 8.7* 8.6* 8.1*  MG  --  2.3 2.2   GFR: Estimated Creatinine Clearance: 51.8 mL/min (A) (by C-G formula based on SCr of 1.33 mg/dL (H)). Liver Function Tests: No results for input(s): AST, ALT, ALKPHOS, BILITOT, PROT, ALBUMIN in the last 168 hours. No results for input(s): LIPASE, AMYLASE in the last 168 hours. No results for input(s): AMMONIA in the last 168 hours. Coagulation Profile: Recent Labs  Lab 12/25/19 2323 12/26/19 0715 12/27/19 0008  INR 1.8* 2.1* 3.0*   Cardiac Enzymes: No results for input(s): CKTOTAL, CKMB, CKMBINDEX, TROPONINI in the last 168 hours. BNP (last 3 results) No results for input(s): PROBNP in the last 8760 hours. HbA1C: Recent Labs    12/26/19 0813  HGBA1C 5.7*   CBG: Recent Labs  Lab 12/26/19 1152 12/26/19 1756 12/26/19 2113 12/27/19 0755  12/27/19 1218  GLUCAP 200* 243* 236* 171* 351*   Lipid Profile: Recent Labs    12/26/19 0813  CHOL 105  HDL 36*  LDLCALC 44  TRIG 123  CHOLHDL 2.9   Thyroid Function Tests: Recent Labs    12/26/19 0813  TSH 3.237   Anemia Panel: No results for input(s): VITAMINB12, FOLATE, FERRITIN, TIBC, IRON, RETICCTPCT in the last 72 hours. Sepsis Labs: No results for input(s): PROCALCITON, LATICACIDVEN in the last 168 hours.  Recent Results (from the past 240 hour(s))  SARS Coronavirus 2 by RT PCR (hospital order, performed in [facility] hospital lab) Nasopharyngeal Nasopharyngeal Swab     Status: None   Collection Time: 12/26/19 12:47 AM   Specimen: Nasopharyngeal Swab  Result Value Ref Range Status   SARS Coronavirus 2 NEGATIVE NEGATIVE Final    Comment: (NOTE) SARS-CoV-2 target nucleic acids are NOT DETECTED.  The SARS-CoV-2 RNA is generally detectable in upper and lower respiratory specimens during the acute phase of infection. The lowest concentration of SARS-CoV-2 viral copies this assay  can detect is 250 copies / mL. A negative result does not preclude SARS-CoV-2 infection and should not be used as the sole basis for treatment or other patient management decisions.  A negative result may occur with improper specimen collection / handling, submission of specimen other than nasopharyngeal swab, presence of viral mutation(s) within the areas targeted by this assay, and inadequate number of viral copies (<250 copies / mL). A negative result must be combined with clinical observations, patient history, and epidemiological information.  Fact Sheet for Patients:   StrictlyIdeas.no  Fact Sheet for Healthcare Providers: BankingDealers.co.za  This test is not yet approved or  cleared by the [place] FDA and has been authorized for detection and/or diagnosis of SARS-CoV-2 by FDA under an Emergency Use Authorization (EUA).  This  EUA will remain in effect (meaning this test can be used) for the duration of the COVID-19 declaration under Section 564(b)(1) of the Act, 21 U.S.C. section 360bbb-3(b)(1), unless the authorization is terminated or revoked sooner.  Performed at [facility], [place], [place] [address]   C Difficile Quick Screen w PCR reflex     Status: None   Collection Time: 12/26/19  7:54 AM   Specimen: STOOL  Result Value Ref Range Status   C Diff antigen NEGATIVE NEGATIVE Final   C Diff toxin NEGATIVE NEGATIVE Final   C Diff interpretation No C. difficile detected.  Final    Comment: Performed at [facility], [address] [place], [place] [address]     Radiology Studies: DG Chest Portable 1 View  Result Date: 12/25/2019 CLINICAL DATA:  Chest pain EXAM: PORTABLE CHEST 1 VIEW COMPARISON:  CT 08/20/2015, radiograph 07/25/2015 FINDINGS: Postsurgical changes related to prior CABG including intact and aligned sternotomy wires and multiple surgical clips projecting over the mediastinum. Mild cardiomegaly is similar to prior accounting for the portable technique. The aorta is calcified. The remaining cardiomediastinal contours are unremarkable. Central pulmonary vascular congestion is noted. There are few peripheral septal lines as well as some hazy bibasilar opacity. No pneumothorax or visible effusion. No focal consolidative opacity is seen. No acute osseous or soft tissue abnormality. Telemetry leads overlie the chest. IMPRESSION: 1. Mild cardiomegaly with central pulmonary vascular congestion with features of mild interstitial edema. Electronically Signed   By: Lovena Le M.D.   On: 12/25/2019 23:45   ECHOCARDIOGRAM COMPLETE  Result Date: 12/26/2019    ECHOCARDIOGRAM REPORT   Patient Name:   LOY MCCARTT Date of Exam: 12/26/2019 Medical Rec #:  [id number]        Height:       68.0 in Accession #:    [id number]       Weight:       180.0 lb Date of Birth:  1945/11/02         BSA:          1.954 m Patient Age:    42 years         BP:           139/52 mmHg Patient Gender: M                HR:           54 bpm. Exam Location:  [facility] Procedure: 2D Echo, Cardiac Doppler and Color Doppler Indications:     Elevated troponin  History:         Patient has prior history of Echocardiogram examinations, most  recent 07/26/2019. Previous Myocardial Infarction,                  Arrythmias:Atrial Fibrillation, Signs/Symptoms:Shortness of                  Breath and Dyspnea; Risk Factors:Hypertension. Mild Aortic                  stenosis.  Sonographer:     Sherrie Sport RDCS (AE) Referring Phys:  [phone] CHRISTOPHER END Diagnosing Phys: Nelva Bush MD IMPRESSIONS  1. Left ventricular ejection fraction, by estimation, is 60 to 65%. The left ventricle has normal function. Left ventricular endocardial border not optimally defined to evaluate regional wall motion. There is moderate left ventricular hypertrophy, with severe focal basal hypertrophy of the septum. Left ventricular diastolic parameters are consistent with Grade II diastolic dysfunction (pseudonormalization). Elevated left atrial pressure.  2. Right ventricular systolic function is mildly reduced. The right ventricular size is normal. Tricuspid regurgitation signal is inadequate for assessing PA pressure.  3. Left atrial size was mildly dilated.  4. The mitral valve is grossly normal. No evidence of mitral valve regurgitation. No evidence of mitral stenosis.  5. The aortic valve is tricuspid. Aortic valve regurgitation is not visualized. Mild to moderate aortic valve stenosis, though degree of stenosis may be underestimated due to poor Doppler envelopes. FINDINGS  Left Ventricle: Left ventricular ejection fraction, by estimation, is 60 to 65%. The left ventricle has normal function. Left ventricular endocardial border not optimally defined to evaluate regional wall motion. The left ventricular internal cavity size was normal in  size. There is moderate left ventricular hypertrophy, with severe focal basal hypertrophy of the septum. Left ventricular diastolic parameters are consistent with Grade II diastolic dysfunction (pseudonormalization). Elevated left atrial pressure. Right Ventricle: The right ventricular size is normal. Right vetricular wall thickness was not assessed. Right ventricular systolic function is mildly reduced. Tricuspid regurgitation signal is inadequate for assessing PA pressure. Left Atrium: Left atrial size was mildly dilated. Right Atrium: Right atrial size was normal in size. Pericardium: The pericardium was not well visualized. Mitral Valve: The mitral valve is grossly normal. No evidence of mitral valve regurgitation. No evidence of mitral valve stenosis. Tricuspid Valve: The tricuspid valve is not well visualized. Tricuspid valve regurgitation is trivial. Aortic Valve: The aortic valve is tricuspid. . There is moderate thickening and moderate calcification of the aortic valve. Aortic valve regurgitation is not visualized. Mild to moderate aortic stenosis is present, though degree of stenosis is likely underestimated due to poor Doppler envelopes. There is moderate thickening of the aortic valve. There is moderate calcification of the aortic valve. Aortic valve mean gradient measures 8.0 mmHg. Aortic valve peak gradient measures 16.5 mmHg. Aortic valve  area, by VTI measures 1.72 cm. Pulmonic Valve: The pulmonic valve was not well visualized. Pulmonic valve regurgitation is not visualized. No evidence of pulmonic stenosis. Aorta: The aortic root is normal in size and structure. Pulmonary Artery: The pulmonary artery is not well seen. Venous: The inferior vena cava was not well visualized. IAS/Shunts: The interatrial septum was not well visualized.  LEFT VENTRICLE PLAX 2D LVIDd:         4.10 cm  Diastology LVIDs:         2.63 cm  LV e' lateral:   7.83 cm/s LV PW:         1.40 cm  LV E/e' lateral: 13.0 LV IVS:         1.94 cm  LV e' medial:    5.55 cm/s LVOT diam:     2.00 cm  LV E/e' medial:  18.4 LV SV:         68 LV SV Index:   35 LVOT Area:     3.14 cm  RIGHT VENTRICLE TAPSE (M-mode): 1.6 cm LEFT ATRIUM             Index       RIGHT ATRIUM           Index LA diam:        4.20 cm 2.15 cm/m  RA Area:     14.00 cm LA Vol (A2C):   26.3 ml 13.46 ml/m RA Volume:   32.70 ml  16.73 ml/m LA Vol (A4C):   59.5 ml 30.45 ml/m LA Biplane Vol: 43.2 ml 22.11 ml/m  AORTIC VALVE                    PULMONIC VALVE AV Area (Vmax):    1.20 cm     PV Vmax:        1.01 m/s AV Area (Vmean):   1.27 cm     PV Peak grad:   4.1 mmHg AV Area (VTI):     1.72 cm     RVOT Peak grad: 6 mmHg AV Vmax:           203.33 cm/s AV Vmean:          126.000 cm/s AV VTI:            0.399 m AV Peak Grad:      16.5 mmHg AV Mean Grad:      8.0 mmHg LVOT Vmax:         77.60 cm/s LVOT Vmean:        50.900 cm/s LVOT VTI:          0.218 m LVOT/AV VTI ratio: 0.55  AORTA Ao Root diam: 2.80 cm MITRAL VALVE MV Area (PHT): 2.60 cm     SHUNTS MV Decel Time: 292 msec     Systemic VTI:  0.22 m MV E velocity: 102.00 cm/s  Systemic Diam: 2.00 cm MV A velocity: 78.60 cm/s MV E/A ratio:  1.30 Harrell Gave End MD Electronically signed by Nelva Bush MD Signature Date/Time: 12/26/2019/3:54:01 PM    Final         Scheduled Meds: . amLODipine  5 mg Oral Daily  . clopidogrel  75 mg Oral Daily  . furosemide  20 mg Oral Daily  . insulin aspart  0-15 Units Subcutaneous TID WC  . insulin aspart  0-5 Units Subcutaneous QHS  . insulin glargine  15 Units Subcutaneous Daily  . ondansetron (ZOFRAN) IV  4 mg Intravenous Once  . pantoprazole  40 mg Oral Daily  . ramipril  10 mg Oral Daily  . rosuvastatin  40 mg Oral Daily  . sucralfate  1 g Oral BID  . vitamin B-12  1,000 mcg Oral QPM   Continuous Infusions: . amiodarone 30 mg/hr (12/27/19 1159)     LOS: 1 day    Time spent: 56 minutes   Para Skeans, MD [facility] [phone] To contact the  attending provider between 7A-7P or the covering provider during after hours 7P-7A, please log into the web site [email or web address] and access using universal [place] password for that web site. If you do not have the password, please call the hospital operator.  12/27/2019, 3:43 PM

## 2019-12-27 NOTE — Progress Notes (Signed)
Progress Note  Patient Name: Seth Hardy Date of Encounter: 12/27/2019  [facility] Cardiologist: Kathlyn Sacramento, MD   Subjective   Patient seen this morning with wife at bedside. Does not feel well, went back into atrial fibrillation with rapid heart rates.  Inpatient Medications    Scheduled Meds: . amLODipine  5 mg Oral Daily  . clopidogrel  75 mg Oral Daily  . furosemide  20 mg Oral Daily  . insulin aspart  0-15 Units Subcutaneous TID WC  . insulin aspart  0-5 Units Subcutaneous QHS  . insulin glargine  15 Units Subcutaneous Daily  . ondansetron (ZOFRAN) IV  4 mg Intravenous Once  . pantoprazole  40 mg Oral Daily  . ramipril  10 mg Oral Daily  . rosuvastatin  40 mg Oral Daily  . sucralfate  1 g Oral BID  . vitamin B-12  1,000 mcg Oral QPM   Continuous Infusions: . amiodarone 60 mg/hr (12/27/19 0940)   Followed by  . amiodarone     PRN Meds: acetaminophen, ALPRAZolam, nitroGLYCERIN, ondansetron (ZOFRAN) IV   Vital Signs    Vitals:   12/26/19 2003 12/27/19 0407 12/27/19 0709 12/27/19 0754  BP: (!) 153/55 (!) 153/57 (!) 163/96 (!) 160/80  Pulse: (!) 54 (!) 54 (!) 132 91  Resp:    18  Temp: 97.9 F (36.6 C) 98.1 F (36.7 C) 98.6 F (37 C) 97.7 F (36.5 C)  TempSrc: Oral Oral Oral Oral  SpO2: 97% 100% 100% 100%  Weight:  82.6 kg    Height:        Intake/Output Summary (Last 24 hours) at 12/27/2019 0946 Last data filed at 12/27/2019 0600 Gross per 24 hour  Intake 360 ml  Output 200 ml  Net 160 ml   Last 3 Weights 12/27/2019 12/26/2019 12/25/2019  Weight (lbs) 182 lb 3.2 oz 182 lb 14.4 oz 180 lb  Weight (kg) 82.645 kg 82.963 kg 81.647 kg      Telemetry    Atrial fibrillation heart rate 126- Personally Reviewed  ECG    No new tracing  Physical Exam   GEN:  Appears anxious.   Neck: No JVD Cardiac: Irregular irregular, 2/6 systolic murmur Respiratory: Clear to auscultation bilaterally. GI: Soft, nontender, non-distended  MS: No edema; No  deformity. Neuro:  Nonfocal  Psych: Normal affect   Labs    High Sensitivity Troponin:   Recent Labs  Lab 12/25/19 2323 12/26/19 0126 12/26/19 0715 12/26/19 1738 12/27/19 0008  TROPONINIHS 15 102* 754* 809* 499*      Chemistry Recent Labs  Lab 12/25/19 2323 12/26/19 0813 12/27/19 0008  NA 140 140 138  K 3.7 3.6 3.9  CL 106 106 105  CO2 23 24 25   GLUCOSE 258* 168* 244*  BUN 17 16 20   CREATININE 1.33* 1.29* 1.33*  CALCIUM 8.7* 8.6* 8.1*  GFRNONAA 53* 55* 53*  GFRAA >60 >60 >60  ANIONGAP 11 10 8      Hematology Recent Labs  Lab 12/25/19 2323 12/26/19 0813 12/27/19 0008  WBC 4.1 4.3 4.2  RBC 3.17* 3.01* 2.65*  HGB 10.5* 10.1* 8.8*  HCT 33.0* 30.6* 27.3*  MCV 104.1* 101.7* 103.0*  MCH 33.1 33.6 33.2  MCHC 31.8 33.0 32.2  RDW 14.2 14.3 13.9  PLT 182 182 165    BNPNo results for input(s): BNP, PROBNP in the last 168 hours.   DDimer No results for input(s): DDIMER in the last 168 hours.   Radiology    DG Chest Portable  1 View  Result Date: 12/25/2019 CLINICAL DATA:  Chest pain EXAM: PORTABLE CHEST 1 VIEW COMPARISON:  CT 08/20/2015, radiograph 07/25/2015 FINDINGS: Postsurgical changes related to prior CABG including intact and aligned sternotomy wires and multiple surgical clips projecting over the mediastinum. Mild cardiomegaly is similar to prior accounting for the portable technique. The aorta is calcified. The remaining cardiomediastinal contours are unremarkable. Central pulmonary vascular congestion is noted. There are few peripheral septal lines as well as some hazy bibasilar opacity. No pneumothorax or visible effusion. No focal consolidative opacity is seen. No acute osseous or soft tissue abnormality. Telemetry leads overlie the chest. IMPRESSION: 1. Mild cardiomegaly with central pulmonary vascular congestion with features of mild interstitial edema. Electronically Signed   By: Lovena Le M.D.   On: 12/25/2019 23:45   ECHOCARDIOGRAM COMPLETE  Result  Date: 12/26/2019    ECHOCARDIOGRAM REPORT   Patient Name:   Seth Hardy Date of Exam: 12/26/2019 Medical Rec #:  [id number]        Height:       68.0 in Accession #:    [id number]       Weight:       180.0 lb Date of Birth:  09/06/1945        BSA:          1.954 m Patient Age:    74 years         BP:           139/52 mmHg Patient Gender: M                HR:           54 bpm. Exam Location:  [facility] Procedure: 2D Echo, Cardiac Doppler and Color Doppler Indications:     Elevated troponin  History:         Patient has prior history of Echocardiogram examinations, most                  recent 07/26/2019. Previous Myocardial Infarction,                  Arrythmias:Atrial Fibrillation, Signs/Symptoms:Shortness of                  Breath and Dyspnea; Risk Factors:Hypertension. Mild Aortic                  stenosis.  Sonographer:     Sherrie Sport RDCS (AE) Referring Phys:  [phone] CHRISTOPHER END Diagnosing Phys: Nelva Bush MD IMPRESSIONS  1. Left ventricular ejection fraction, by estimation, is 60 to 65%. The left ventricle has normal function. Left ventricular endocardial border not optimally defined to evaluate regional wall motion. There is moderate left ventricular hypertrophy, with severe focal basal hypertrophy of the septum. Left ventricular diastolic parameters are consistent with Grade II diastolic dysfunction (pseudonormalization). Elevated left atrial pressure.  2. Right ventricular systolic function is mildly reduced. The right ventricular size is normal. Tricuspid regurgitation signal is inadequate for assessing PA pressure.  3. Left atrial size was mildly dilated.  4. The mitral valve is grossly normal. No evidence of mitral valve regurgitation. No evidence of mitral stenosis.  5. The aortic valve is tricuspid. Aortic valve regurgitation is not visualized. Mild to moderate aortic valve stenosis, though degree of stenosis may be underestimated due to poor Doppler envelopes. FINDINGS  Left Ventricle: Left  ventricular ejection fraction, by estimation, is 60 to 65%. The left ventricle has normal function. Left ventricular endocardial border not optimally  defined to evaluate regional wall motion. The left ventricular internal cavity size was normal in size. There is moderate left ventricular hypertrophy, with severe focal basal hypertrophy of the septum. Left ventricular diastolic parameters are consistent with Grade II diastolic dysfunction (pseudonormalization). Elevated left atrial pressure. Right Ventricle: The right ventricular size is normal. Right vetricular wall thickness was not assessed. Right ventricular systolic function is mildly reduced. Tricuspid regurgitation signal is inadequate for assessing PA pressure. Left Atrium: Left atrial size was mildly dilated. Right Atrium: Right atrial size was normal in size. Pericardium: The pericardium was not well visualized. Mitral Valve: The mitral valve is grossly normal. No evidence of mitral valve regurgitation. No evidence of mitral valve stenosis. Tricuspid Valve: The tricuspid valve is not well visualized. Tricuspid valve regurgitation is trivial. Aortic Valve: The aortic valve is tricuspid. . There is moderate thickening and moderate calcification of the aortic valve. Aortic valve regurgitation is not visualized. Mild to moderate aortic stenosis is present, though degree of stenosis is likely underestimated due to poor Doppler envelopes. There is moderate thickening of the aortic valve. There is moderate calcification of the aortic valve. Aortic valve mean gradient measures 8.0 mmHg. Aortic valve peak gradient measures 16.5 mmHg. Aortic valve  area, by VTI measures 1.72 cm. Pulmonic Valve: The pulmonic valve was not well visualized. Pulmonic valve regurgitation is not visualized. No evidence of pulmonic stenosis. Aorta: The aortic root is normal in size and structure. Pulmonary Artery: The pulmonary artery is not well seen. Venous: The inferior vena cava was  not well visualized. IAS/Shunts: The interatrial septum was not well visualized.  LEFT VENTRICLE PLAX 2D LVIDd:         4.10 cm  Diastology LVIDs:         2.63 cm  LV e' lateral:   7.83 cm/s LV PW:         1.40 cm  LV E/e' lateral: 13.0 LV IVS:        1.94 cm  LV e' medial:    5.55 cm/s LVOT diam:     2.00 cm  LV E/e' medial:  18.4 LV SV:         68 LV SV Index:   35 LVOT Area:     3.14 cm  RIGHT VENTRICLE TAPSE (M-mode): 1.6 cm LEFT ATRIUM             Index       RIGHT ATRIUM           Index LA diam:        4.20 cm 2.15 cm/m  RA Area:     14.00 cm LA Vol (A2C):   26.3 ml 13.46 ml/m RA Volume:   32.70 ml  16.73 ml/m LA Vol (A4C):   59.5 ml 30.45 ml/m LA Biplane Vol: 43.2 ml 22.11 ml/m  AORTIC VALVE                    PULMONIC VALVE AV Area (Vmax):    1.20 cm     PV Vmax:        1.01 m/s AV Area (Vmean):   1.27 cm     PV Peak grad:   4.1 mmHg AV Area (VTI):     1.72 cm     RVOT Peak grad: 6 mmHg AV Vmax:           203.33 cm/s AV Vmean:          126.000 cm/s AV VTI:  0.399 m AV Peak Grad:      16.5 mmHg AV Mean Grad:      8.0 mmHg LVOT Vmax:         77.60 cm/s LVOT Vmean:        50.900 cm/s LVOT VTI:          0.218 m LVOT/AV VTI ratio: 0.55  AORTA Ao Root diam: 2.80 cm MITRAL VALVE MV Area (PHT): 2.60 cm     SHUNTS MV Decel Time: 292 msec     Systemic VTI:  0.22 m MV E velocity: 102.00 cm/s  Systemic Diam: 2.00 cm MV A velocity: 78.60 cm/s MV E/A ratio:  1.30 Nelva Bush MD Electronically signed by Nelva Bush MD Signature Date/Time: 12/26/2019/3:54:01 PM    Final     Cardiac Studies   TTE (07/26/19): 1. Left ventricular ejection fraction, by estimation, is 60 to 65%. The  left ventricle has normal function. The left ventricle has no regional  wall motion abnormalities. There is mild left ventricular hypertrophy.  Left ventricular diastolic parameters  are consistent with Grade II diastolic dysfunction (pseudonormalization).  2. Right ventricular systolic function is normal. The  right ventricular  size is normal.  3. Left atrial size was severely dilated.  4. The mitral valve is grossly normal. No evidence of mitral valve  regurgitation.  5. Heavily calcified aortic valve. DVI = 0.35, peak gradient 67mmHg, mean  gradient 44mmHg, AVA 1.35cm2. The aortic valve is tricuspid. Aortic valve  regurgitation is not visualized. Moderate aortic valve stenosis.  6. The inferior vena cava is normal in size with greater than 50%  respiratory variability, suggesting right atrial pressure of 3 mmHg.  Patient Profile     74 y.o. male with history of CAD status post CABG 2012, PCI to RCA, carotid artery stenosis status post left CEA 2017, hypertension, diabetes, paroxysmal atrial fibrillation being seen for A. fib RVR  Assessment & Plan    1. Atrial fibrillation with rapid ventricular response -Patient becomes symptomatic when he is in atrial fibrillation. -Was asymptomatic yesterday when in sinus -Stop p.o. amiodarone, start IV amiodarone with bolus. Hopefully patient able to convert to sinus rhythm on amiodarone bolus.  -He may need pacemaker to adequately manage afib, due to history of baseline bradycardia when in sinus rhythm. Will need EP input regarding this. -INR therapeutic at 3.0  2. Elevated troponins -Secondary to demand ischemia -No invasive work-up for stress testing planned at this point.  3. History of CAD, CABG -Continue Plavix, statin,      Signed, Kate Sable, MD  12/27/2019, 9:46 AM

## 2019-12-27 NOTE — Progress Notes (Signed)
Pt ambulated to BR once returned to bed pt converted from SB to A-fib.Vitals obtained scheduled medications administered. MD notified.

## 2019-12-27 NOTE — Progress Notes (Signed)
Heart rate in the 50's.  Stopped Amiodarone and hospitalist notified.

## 2019-12-28 DIAGNOSIS — I214 Non-ST elevation (NSTEMI) myocardial infarction: Secondary | ICD-10-CM

## 2019-12-28 LAB — CBC
HCT: 28.5 % — ABNORMAL LOW (ref 39.0–52.0)
Hemoglobin: 9.7 g/dL — ABNORMAL LOW (ref 13.0–17.0)
MCH: 33.7 pg (ref 26.0–34.0)
MCHC: 34 g/dL (ref 30.0–36.0)
MCV: 99 fL (ref 80.0–100.0)
Platelets: 187 10*3/uL (ref 150–400)
RBC: 2.88 MIL/uL — ABNORMAL LOW (ref 4.22–5.81)
RDW: 13.6 % (ref 11.5–15.5)
WBC: 3.9 10*3/uL — ABNORMAL LOW (ref 4.0–10.5)
nRBC: 0 % (ref 0.0–0.2)

## 2019-12-28 LAB — COMPREHENSIVE METABOLIC PANEL
ALT: 20 U/L (ref 0–44)
AST: 20 U/L (ref 15–41)
Albumin: 3.3 g/dL — ABNORMAL LOW (ref 3.5–5.0)
Alkaline Phosphatase: 61 U/L (ref 38–126)
Anion gap: 10 (ref 5–15)
BUN: 17 mg/dL (ref 8–23)
CO2: 26 mmol/L (ref 22–32)
Calcium: 8.2 mg/dL — ABNORMAL LOW (ref 8.9–10.3)
Chloride: 100 mmol/L (ref 98–111)
Creatinine, Ser: 1.32 mg/dL — ABNORMAL HIGH (ref 0.61–1.24)
GFR calc Af Amer: >60 mL/min (ref >60)
GFR calc non Af Amer: 53 mL/min — ABNORMAL LOW (ref >60)
Glucose, Bld: 274 mg/dL — ABNORMAL HIGH (ref 70–99)
Potassium: 4.1 mmol/L (ref 3.5–5.1)
Sodium: 136 mmol/L (ref 135–145)
Total Bilirubin: 0.7 mg/dL (ref 0.3–1.2)
Total Protein: 5.9 g/dL — ABNORMAL LOW (ref 6.5–8.1)

## 2019-12-28 LAB — MAGNESIUM: Magnesium: 2.1 mg/dL (ref 1.7–2.4)

## 2019-12-28 LAB — PROTIME-INR
INR: 2.6 — ABNORMAL HIGH (ref 0.8–1.2)
Prothrombin Time: 27.2 seconds — ABNORMAL HIGH (ref 11.4–15.2)

## 2019-12-28 LAB — GLUCOSE, CAPILLARY
Glucose-Capillary: 181 mg/dL — ABNORMAL HIGH (ref 70–99)
Glucose-Capillary: 267 mg/dL — ABNORMAL HIGH (ref 70–99)

## 2019-12-28 MED ORDER — PANTOPRAZOLE SODIUM 40 MG PO TBEC: 40.0000 mg | DELAYED_RELEASE_TABLET | Freq: Every day | ORAL | 0 refills | Status: AC

## 2019-12-28 MED ORDER — WARFARIN SODIUM 5 MG PO TABS
5.0000 mg | ORAL_TABLET | Freq: Once | ORAL | Status: DC
  Filled 2019-12-28: qty 1

## 2019-12-28 MED ORDER — AMIODARONE HCL 200 MG PO TABS: 200.0000 mg | ORAL_TABLET | Freq: Two times a day (BID) | ORAL | 0 refills | Status: DC

## 2019-12-28 MED ORDER — WARFARIN - PHARMACIST DOSING INPATIENT: Freq: Every day | Status: DC

## 2019-12-28 MED ORDER — AMIODARONE HCL 200 MG PO TABS: 200.0000 mg | ORAL_TABLET | Freq: Two times a day (BID) | ORAL | Status: DC

## 2019-12-28 MED ORDER — LOPERAMIDE HCL 2 MG PO CAPS
4.0000 mg | ORAL_CAPSULE | ORAL | Status: DC | PRN
  Administered 2019-12-28 (×2): 4 mg via ORAL
  Filled 2019-12-28 (×2): qty 2

## 2019-12-28 NOTE — Progress Notes (Signed)
Inpatient Diabetes Program Recommendations  AACE/ADA: New Consensus Statement on Inpatient Glycemic Control (2015)  Target Ranges:  Prepandial:   less than 140 mg/dL      Peak postprandial:   less than 180 mg/dL (1-2 hours)      Critically ill patients:  140 - 180 mg/dL   Lab Results  Component Value Date   GLUCAP 181 (H) 12/28/2019   HGBA1C 5.7 (H) 12/26/2019    Review of Glycemic Control Results for Seth Hardy, Seth Hardy (MRN [id number]) as of 12/28/2019 10:38  Ref. Range 12/27/2019 07:55 12/27/2019 12:18 12/27/2019 16:58 12/27/2019 21:01 12/28/2019 08:15  Glucose-Capillary Latest Ref Range: 70 - 99 mg/dL 171 (H) 351 (H) 131 (H) 232 (H) 181 (H)   Diabetes history: DM 2 Outpatient Diabetes medications: Glimepiride 4 mg Daily, Lantus 15 units Daily, Novolog 6-36 units bid with meals Current orders for Inpatient glycemic control:  Lantus 15 units Daily Novolog 0-15 units tid + hs  Inpatient Diabetes Program Recommendations:    -  Consider increasing Lantus to 18 units  -  May also consider adding Novolog 3 units tid meal coverage if eating >50% of meals.   Thanks,  Tama Headings RN, MSN, BC-ADM Inpatient Diabetes Coordinator Team Pager [phone] (8a-5p)

## 2019-12-28 NOTE — Discharge Summary (Signed)
Physician Discharge Summary  Seth Hardy:[id number] DOB: January 17, 1946 DOA: 12/25/2019  PCP: Rusty Aus, MD  Admit date: 12/25/2019 Discharge date: 12/28/2019  Time spent: 35 minutes  Recommendations for Outpatient Follow-up:  1. F/U WITH GI/PCP/CARDIOLOGY/HEMATOLOGY  Discharge Diagnoses:  Principal Problem:   Rapid atrial fibrillation (HCC) Active Problems:   CAD, ARTERY BYPASS GRAFT   Long term (current) use of anticoagulants   Hyperlipidemia   Diabetes mellitus type II, controlled ([place])   Benign essential hypertension   NSTEMI (non-ST elevated myocardial infarction) (HCC)   Atrial fibrillation with RVR (HCC)  Paroxysmal atrial fibrillation with rapid ventricular response. Patient has converted to sinus rhythm.  Diltiazem already taken off.  We will continue anticoagulation for now.  Patient will be evaluated by cardiology, notified Dr. Saunders Revel. Pt on amiodarone drip , with Plan for EP studies. prolonged qt on telemetry was stable. Pt d/c on amiodarone po.  NSTEMI. Secondary to demand ischemia -No invasive work-up for stress testing planned at this point.  Diarrhea. Check C. difficile toxin.  Type 2 diabetes uncontrolled with hyperglycemia. Well-controlled.  Continue sliding scale insulin.  Essential hypertension. Continues on home medicines.  Chronic hypoxemic respiratory failure. Patient is on night oxygen and cpap. Cont cpap at home settings.   Chronic kidney disease stage IIIa. Reviewed previous lab, renal function still stable.  Anemia: Will follow h/h and transfuse as needed,.  IV ppi. H/h stable for discharge.   Discharge Condition: Stable.  Diet recommendation:  Cardiac diet.   Filed Weights   12/25/19 2320 12/26/19 1735 12/27/19 0407  Weight: 81.6 kg 83 kg 82.6 kg    History of present illness:  Seth Meno Robertsonis a 74 y.o.malewith medical history significant ofcoronary artery disease status post CABG and recent cath,  hypertension, diabetes, chronic kidney disease stage III, GERD, hyperlipidemia, iron deficiency anemia and basal cell carcinoma of the skin who is on chronic warfarin therapy on and also on amiodarone at home. Patient came to the ER today secondary to palpitations and chest discomfort.  He is a placed on diltiazem drip. He is also on night oxygen with chronic hypoxemic respiratory failure.  Hospital Course:  8/3.  Patient converted to sinus rhythm, diltiazem drip discontinued.  However, patient troponin increased to 754.  Cardiology consult obtained. Patient also has diarrhea for the last 3 days, multiple loose stools.  But no abdominal pain or nausea vomiting.  C. difficile toxin sent. 12/27/19 Pt seen today for f/u of his a.fib. Pt has been noncompliant with regimen and wife has complaints about his noncompliance affecting his health for past 15 years. Pt denies any depression .Counselled pt extensively on diet and sodium restriction and sugar restriction.  Clinically pt is doing better and cardiology is following as well. He reports some abd pain and diarrhea and c./diff was negative. Pt has been seeing hematology and also receiving iron transfusion, he was told his stomach is leaking and he is bleeding.  12/28/19 Pt is alert,awake have counseled about plan for anemia, anticoagulation and stomach cramps and diarrhea related to gib and imodium to be used as a tempering measure but he is bleeding. Pt is advised to get h/h q monthly and f/u with specialist and be cautious about bleeding and how he feels and to connect with pcp immediately if he feels sob or chest pan or any symptoms that is not his baseline. D/W pt about sleep study and connection to heart failure pulm htn and a. fib.  Consultations: Cardiology-Dr.[name].  Discharge  Exam: Vitals:   12/28/19 0437 12/28/19 1216  BP: (!) 144/49 (!) 142/45  Pulse: (!) 55 (!) 47  Resp: 20 18  Temp: 98.6 F (37 C) 97.7 F (36.5 C)  SpO2: 97% 98%    Physical Exam Vitals reviewed.  Constitutional:      Appearance: He is normal weight.  HENT:     Head: Normocephalic and atraumatic.     Right Ear: External ear normal.     Left Ear: External ear normal.     Nose: Nose normal.     Mouth/Throat:     Mouth: Mucous membranes are moist.  Eyes:     Extraocular Movements: Extraocular movements intact.  Cardiovascular:     Rate and Rhythm: Normal rate and regular rhythm.     Pulses: Normal pulses.     Heart sounds: Normal heart sounds.  Pulmonary:     Effort: Pulmonary effort is normal.     Breath sounds: Normal breath sounds.  Abdominal:     General: Abdomen is flat. Bowel sounds are normal.     Palpations: Abdomen is soft.  Musculoskeletal:     Cervical back: Normal range of motion.  Skin:    General: Skin is warm.  Neurological:     General: No focal deficit present.     Mental Status: He is alert and oriented to person, place, and time.    Discharge Instructions  Discharge Instructions    Call MD for:  difficulty breathing, headache or visual disturbances   Complete by: As directed    Call MD for:  persistant nausea and vomiting   Complete by: As directed    Call MD for:  redness, tenderness, or signs of infection (pain, swelling, redness, odor or green/yellow discharge around incision site)   Complete by: As directed    Call MD for:  severe uncontrolled pain   Complete by: As directed    Call MD for:  temperature >100.4   Complete by: As directed    Diet - low sodium heart healthy   Complete by: As directed    Diet Carb Modified   Complete by: As directed    Discharge instructions   Complete by: As directed    Please make f/u appt with Gi/Cardiology/Hematology/PCP.   Increase activity slowly   Complete by: As directed      Allergies as of 12/28/2019   No Known Allergies     Medication List    STOP taking these medications   acetaminophen 500 MG tablet Commonly known as: TYLENOL   furosemide 20 MG  tablet Commonly known as: LASIX   nitroGLYCERIN 0.4 MG SL tablet Commonly known as: NITROSTAT     TAKE these medications   ALPRAZolam 0.25 MG tablet Commonly known as: XANAX Take 0.25 mg by mouth daily as needed for anxiety.   amiodarone 200 MG tablet Commonly known as: PACERONE Take 1 tablet (200 mg total) by mouth 2 (two) times daily for 6 days. Then take one tab po daily. What changed:   how much to take  when to take this  additional instructions   amLODipine 5 MG tablet Commonly known as: NORVASC Take one tablet by mouth one time daily   B-12 1000 MCG Caps Take 1,000 mcg by mouth every evening.   BLINK TEARS OP Place 1 drop into both eyes daily as needed (dry eyes).   clopidogrel 75 MG tablet Commonly known as: PLAVIX TAKE 1 TABLET BY MOUTH DAILY   Fish Oil 1000  MG Caps Take 1,000 mg by mouth every evening.   glimepiride 4 MG tablet Commonly known as: AMARYL Take 4 mg by mouth daily.   insulin glargine 100 UNIT/ML injection Commonly known as: LANTUS Inject 15 Units into the skin daily.   IRON-C PO Take 1 tablet by mouth daily.   NovoLOG FlexPen 100 UNIT/ML FlexPen Generic drug: insulin aspart Inject 6-36 Units into the skin 2 (two) times daily with a meal.   omega-3 acid ethyl esters 1 g capsule Commonly known as: LOVAZA Take 1 capsule (1 g total) by mouth daily.   OXYGEN Inhale 2.5 L/min into the lungs at bedtime. With CPAP   pantoprazole 40 MG tablet Commonly known as: PROTONIX Take 1 tablet (40 mg total) by mouth daily.   ramipril 10 MG capsule Commonly known as: ALTACE Take 10 mg by mouth daily.   rosuvastatin 40 MG tablet Commonly known as: CRESTOR TAKE ONE TABLET BY MOUTH EVERY DAY   sucralfate 1 g tablet Commonly known as: CARAFATE Take 1 g by mouth 2 (two) times daily.   tadalafil 10 MG tablet Commonly known as: CIALIS Take 10 mg by mouth as needed.   vitamin C 500 MG tablet Commonly known as: ASCORBIC ACID Take 500 mg  by mouth every evening.   warfarin 5 MG tablet Commonly known as: COUMADIN Take as directed. If you are unsure how to take this medication, talk to your nurse or doctor. Original instructions: Take 2.5-5 mg by mouth See admin instructions. Take 5 mg at night on Sun, Mon, Wed, Thur, and Fri.  Take 2.5 mg at night on Tues and Sat   zinc gluconate 50 MG tablet Take 50 mg by mouth daily.      No Known Allergies  The results of significant diagnostics from this hospitalization (including imaging, microbiology, ancillary and laboratory) are listed below for reference.    Significant Diagnostic Studies: DG Chest Portable 1 View  Result Date: 12/25/2019 CLINICAL DATA:  Chest pain EXAM: PORTABLE CHEST 1 VIEW COMPARISON:  CT 08/20/2015, radiograph 07/25/2015 FINDINGS: Postsurgical changes related to prior CABG including intact and aligned sternotomy wires and multiple surgical clips projecting over the mediastinum. Mild cardiomegaly is similar to prior accounting for the portable technique. The aorta is calcified. The remaining cardiomediastinal contours are unremarkable. Central pulmonary vascular congestion is noted. There are few peripheral septal lines as well as some hazy bibasilar opacity. No pneumothorax or visible effusion. No focal consolidative opacity is seen. No acute osseous or soft tissue abnormality. Telemetry leads overlie the chest. IMPRESSION: 1. Mild cardiomegaly with central pulmonary vascular congestion with features of mild interstitial edema. Electronically Signed   By: Lovena Le M.D.   On: 12/25/2019 23:45   ECHOCARDIOGRAM COMPLETE  Result Date: 12/26/2019    ECHOCARDIOGRAM REPORT   Patient Name:   Seth Hardy Date of Exam: 12/26/2019 Medical Rec #:  [id number]        Height:       68.0 in Accession #:    [id number]       Weight:       180.0 lb Date of Birth:  20-Sep-1945        BSA:          1.954 m Patient Age:    89 years         BP:           139/52 mmHg Patient Gender: M  HR:           54 bpm. Exam Location:  [facility] Procedure: 2D Echo, Cardiac Doppler and Color Doppler Indications:     Elevated troponin  History:         Patient has prior history of Echocardiogram examinations, most                  recent 07/26/2019. Previous Myocardial Infarction,                  Arrythmias:Atrial Fibrillation, Signs/Symptoms:Shortness of                  Breath and Dyspnea; Risk Factors:Hypertension. Mild Aortic                  stenosis.  Sonographer:     Sherrie Sport RDCS (AE) Referring Phys:  [id number] CHRISTOPHER END Diagnosing Phys: Nelva Bush MD IMPRESSIONS  1. Left ventricular ejection fraction, by estimation, is 60 to 65%. The left ventricle has normal function. Left ventricular endocardial border not optimally defined to evaluate regional wall motion. There is moderate left ventricular hypertrophy, with severe focal basal hypertrophy of the septum. Left ventricular diastolic parameters are consistent with Grade II diastolic dysfunction (pseudonormalization). Elevated left atrial pressure.  2. Right ventricular systolic function is mildly reduced. The right ventricular size is normal. Tricuspid regurgitation signal is inadequate for assessing PA pressure.  3. Left atrial size was mildly dilated.  4. The mitral valve is grossly normal. No evidence of mitral valve regurgitation. No evidence of mitral stenosis.  5. The aortic valve is tricuspid. Aortic valve regurgitation is not visualized. Mild to moderate aortic valve stenosis, though degree of stenosis may be underestimated due to poor Doppler envelopes. FINDINGS  Left Ventricle: Left ventricular ejection fraction, by estimation, is 60 to 65%. The left ventricle has normal function. Left ventricular endocardial border not optimally defined to evaluate regional wall motion. The left ventricular internal cavity size was normal in size. There is moderate left ventricular hypertrophy, with severe focal basal hypertrophy of the septum.  Left ventricular diastolic parameters are consistent with Grade II diastolic dysfunction (pseudonormalization). Elevated left atrial pressure. Right Ventricle: The right ventricular size is normal. Right vetricular wall thickness was not assessed. Right ventricular systolic function is mildly reduced. Tricuspid regurgitation signal is inadequate for assessing PA pressure. Left Atrium: Left atrial size was mildly dilated. Right Atrium: Right atrial size was normal in size. Pericardium: The pericardium was not well visualized. Mitral Valve: The mitral valve is grossly normal. No evidence of mitral valve regurgitation. No evidence of mitral valve stenosis. Tricuspid Valve: The tricuspid valve is not well visualized. Tricuspid valve regurgitation is trivial. Aortic Valve: The aortic valve is tricuspid. . There is moderate thickening and moderate calcification of the aortic valve. Aortic valve regurgitation is not visualized. Mild to moderate aortic stenosis is present, though degree of stenosis is likely underestimated due to poor Doppler envelopes. There is moderate thickening of the aortic valve. There is moderate calcification of the aortic valve. Aortic valve mean gradient measures 8.0 mmHg. Aortic valve peak gradient measures 16.5 mmHg. Aortic valve  area, by VTI measures 1.72 cm. Pulmonic Valve: The pulmonic valve was not well visualized. Pulmonic valve regurgitation is not visualized. No evidence of pulmonic stenosis. Aorta: The aortic root is normal in size and structure. Pulmonary Artery: The pulmonary artery is not well seen. Venous: The inferior vena cava was not well visualized. IAS/Shunts: The interatrial septum was not well visualized.  LEFT VENTRICLE PLAX 2D LVIDd:         4.10 cm  Diastology LVIDs:         2.63 cm  LV e' lateral:   7.83 cm/s LV PW:         1.40 cm  LV E/e' lateral: 13.0 LV IVS:        1.94 cm  LV e' medial:    5.55 cm/s LVOT diam:     2.00 cm  LV E/e' medial:  18.4 LV SV:         68 LV  SV Index:   35 LVOT Area:     3.14 cm  RIGHT VENTRICLE TAPSE (M-mode): 1.6 cm LEFT ATRIUM             Index       RIGHT ATRIUM           Index LA diam:        4.20 cm 2.15 cm/m  RA Area:     14.00 cm LA Vol (A2C):   26.3 ml 13.46 ml/m RA Volume:   32.70 ml  16.73 ml/m LA Vol (A4C):   59.5 ml 30.45 ml/m LA Biplane Vol: 43.2 ml 22.11 ml/m  AORTIC VALVE                    PULMONIC VALVE AV Area (Vmax):    1.20 cm     PV Vmax:        1.01 m/s AV Area (Vmean):   1.27 cm     PV Peak grad:   4.1 mmHg AV Area (VTI):     1.72 cm     RVOT Peak grad: 6 mmHg AV Vmax:           203.33 cm/s AV Vmean:          126.000 cm/s AV VTI:            0.399 m AV Peak Grad:      16.5 mmHg AV Mean Grad:      8.0 mmHg LVOT Vmax:         77.60 cm/s LVOT Vmean:        50.900 cm/s LVOT VTI:          0.218 m LVOT/AV VTI ratio: 0.55  AORTA Ao Root diam: 2.80 cm MITRAL VALVE MV Area (PHT): 2.60 cm     SHUNTS MV Decel Time: 292 msec     Systemic VTI:  0.22 m MV E velocity: 102.00 cm/s  Systemic Diam: 2.00 cm MV A velocity: 78.60 cm/s MV E/A ratio:  1.30 Harrell Gave End MD Electronically signed by Nelva Bush MD Signature Date/Time: 12/26/2019/3:54:01 PM    Final     Microbiology: Recent Results (from the past 240 hour(s))  SARS Coronavirus 2 by RT PCR (hospital order, performed in [facility] hospital lab) Nasopharyngeal Nasopharyngeal Swab     Status: None   Collection Time: 12/26/19 12:47 AM   Specimen: Nasopharyngeal Swab  Result Value Ref Range Status   SARS Coronavirus 2 NEGATIVE NEGATIVE Final    Comment: (NOTE) SARS-CoV-2 target nucleic acids are NOT DETECTED.  The SARS-CoV-2 RNA is generally detectable in upper and lower respiratory specimens during the acute phase of infection. The lowest concentration of SARS-CoV-2 viral copies this assay can detect is 250 copies / mL. A negative result does not preclude SARS-CoV-2 infection and should not be used as the sole basis for treatment or other patient management  decisions.  A negative result may occur with improper specimen collection / handling, submission of specimen other than nasopharyngeal swab, presence of viral mutation(s) within the areas targeted by this assay, and inadequate number of viral copies (<250 copies / mL). A negative result must be combined with clinical observations, patient history, and epidemiological information.  Fact Sheet for Patients:   StrictlyIdeas.no  Fact Sheet for Healthcare Providers: BankingDealers.co.za  This test is not yet approved or  cleared by the [place] FDA and has been authorized for detection and/or diagnosis of SARS-CoV-2 by FDA under an Emergency Use Authorization (EUA).  This EUA will remain in effect (meaning this test can be used) for the duration of the COVID-19 declaration under Section 564(b)(1) of the Act, 21 U.S.C. section 360bbb-3(b)(1), unless the authorization is terminated or revoked sooner.  Performed at [facility], [place], [place] [address]   C Difficile Quick Screen w PCR reflex     Status: None   Collection Time: 12/26/19  7:54 AM   Specimen: STOOL  Result Value Ref Range Status   C Diff antigen NEGATIVE NEGATIVE Final   C Diff toxin NEGATIVE NEGATIVE Final   C Diff interpretation No C. difficile detected.  Final    Comment: Performed at [facility], [address] [place], [place] [address]     Labs: Basic Metabolic Panel: Recent Labs  Lab 12/25/19 2323 12/26/19 0813 12/27/19 0008 12/28/19 1221 12/28/19 1400  NA 140 140 138 136  --   K 3.7 3.6 3.9 4.1  --   CL 106 106 105 100  --   CO2 23 24 25 26   --   GLUCOSE 258* 168* 244* 274*  --   BUN 17 16 20 17   --   CREATININE 1.33* 1.29* 1.33* 1.32*  --   CALCIUM 8.7* 8.6* 8.1* 8.2*  --   MG  --  2.3 2.2  --  2.1   Liver Function Tests: Recent Labs  Lab 12/28/19 1221  AST 20  ALT 20  ALKPHOS 61  BILITOT 0.7  PROT  5.9*  ALBUMIN 3.3*   No results for input(s): LIPASE, AMYLASE in the last 168 hours. No results for input(s): AMMONIA in the last 168 hours. CBC: Recent Labs  Lab 12/25/19 2323 12/26/19 0813 12/27/19 0008 12/28/19 1221  WBC 4.1 4.3 4.2 3.9*  NEUTROABS  --   --  2.8  --   HGB 10.5* 10.1* 8.8* 9.7*  HCT 33.0* 30.6* 27.3* 28.5*  MCV 104.1* 101.7* 103.0* 99.0  PLT 182 182 165 187   Cardiac Enzymes: No results for input(s): CKTOTAL, CKMB, CKMBINDEX, TROPONINI in the last 168 hours. BNP: BNP (last 3 results) No results for input(s): BNP in the last 8760 hours.  ProBNP (last 3 results) No results for input(s): PROBNP in the last 8760 hours.  CBG: Recent Labs  Lab 12/27/19 1218 12/27/19 1658 12/27/19 2101 12/28/19 0815 12/28/19 1214  GLUCAP 351* 131* 232* 181* 267*    Signed: Para Skeans MD.  [facility] 12/28/2019, 4:03 PM

## 2019-12-28 NOTE — Progress Notes (Signed)
Progress Note  Patient Name: Seth Hardy Date of Encounter: 12/28/2019  [facility] Cardiologist: Kathlyn Sacramento, MD   Subjective   The patient went into recurrent atrial fibrillation with RVR yesterday morning but converted to normal sinus rhythm with amiodarone drip after few hours.  He has been in sinus bradycardia since then.  No chest pain or shortness of breath.  He continues to have intermittent diarrhea.  Inpatient Medications    Scheduled Meds: . amiodarone  200 mg Oral BID  . amLODipine  5 mg Oral Daily  . clopidogrel  75 mg Oral Daily  . furosemide  20 mg Oral Daily  . insulin aspart  0-15 Units Subcutaneous TID WC  . insulin aspart  0-5 Units Subcutaneous QHS  . insulin glargine  15 Units Subcutaneous Daily  . ondansetron (ZOFRAN) IV  4 mg Intravenous Once  . pantoprazole  40 mg Oral Daily  . ramipril  10 mg Oral Daily  . rosuvastatin  40 mg Oral Daily  . sucralfate  1 g Oral BID  . vitamin B-12  1,000 mcg Oral QPM   Continuous Infusions:  PRN Meds: acetaminophen, ALPRAZolam, loperamide, nitroGLYCERIN, ondansetron (ZOFRAN) IV   Vital Signs    Vitals:   12/27/19 1129 12/27/19 1658 12/27/19 2017 12/28/19 0437  BP: (!) 132/48 (!) 141/52 (!) 163/41 (!) 144/49  Pulse: (!) 52 (!) 55 (!) 53 (!) 55  Resp: 18 18 20 20   Temp: 98.2 F (36.8 C) 97.7 F (36.5 C) 98.5 F (36.9 C) 98.6 F (37 C)  TempSrc: Oral  Oral Oral  SpO2: 96% 98% 99% 97%  Weight:      Height:        Intake/Output Summary (Last 24 hours) at 12/28/2019 1012 Last data filed at 12/28/2019 0300 Gross per 24 hour  Intake 380.29 ml  Output 1300 ml  Net -919.71 ml   Last 3 Weights 12/27/2019 12/26/2019 12/25/2019  Weight (lbs) 182 lb 3.2 oz 182 lb 14.4 oz 180 lb  Weight (kg) 82.645 kg 82.963 kg 81.647 kg      Telemetry    Atrial fibrillation heart rate 126 yesterday converted to sinus bradycardia- Personally Reviewed  ECG    No new tracing  Physical Exam   GEN:  No acute distress.  Neck: No JVD Cardiac: Regular rate and rhythm with bradycardia.  3 out of 6 systolic ejection murmur in the aortic area which is mid peaking. Respiratory: Clear to auscultation bilaterally. GI: Soft, nontender, non-distended  MS: No edema; No deformity. Neuro:  Nonfocal  Psych: Normal affect   Labs    High Sensitivity Troponin:   Recent Labs  Lab 12/25/19 2323 12/26/19 0126 12/26/19 0715 12/26/19 1738 12/27/19 0008  TROPONINIHS 15 102* 754* 809* 499*      Chemistry Recent Labs  Lab 12/25/19 2323 12/26/19 0813 12/27/19 0008  NA 140 140 138  K 3.7 3.6 3.9  CL 106 106 105  CO2 23 24 25   GLUCOSE 258* 168* 244*  BUN 17 16 20   CREATININE 1.33* 1.29* 1.33*  CALCIUM 8.7* 8.6* 8.1*  GFRNONAA 53* 55* 53*  GFRAA >60 >60 >60  ANIONGAP 11 10 8      Hematology Recent Labs  Lab 12/25/19 2323 12/26/19 0813 12/27/19 0008  WBC 4.1 4.3 4.2  RBC 3.17* 3.01* 2.65*  HGB 10.5* 10.1* 8.8*  HCT 33.0* 30.6* 27.3*  MCV 104.1* 101.7* 103.0*  MCH 33.1 33.6 33.2  MCHC 31.8 33.0 32.2  RDW 14.2 14.3 13.9  PLT 182 182 165    BNPNo results for input(s): BNP, PROBNP in the last 168 hours.   DDimer No results for input(s): DDIMER in the last 168 hours.   Radiology    ECHOCARDIOGRAM COMPLETE  Result Date: 12/26/2019    ECHOCARDIOGRAM REPORT   Patient Name:   MEARLE DREW Date of Exam: 12/26/2019 Medical Rec #:  [id number]        Height:       68.0 in Accession #:    [id number]       Weight:       180.0 lb Date of Birth:  08-18-1945        BSA:          1.954 m Patient Age:    74 years         BP:           139/52 mmHg Patient Gender: M                HR:           54 bpm. Exam Location:  [facility] Procedure: 2D Echo, Cardiac Doppler and Color Doppler Indications:     Elevated troponin  History:         Patient has prior history of Echocardiogram examinations, most                  recent 07/26/2019. Previous Myocardial Infarction,                  Arrythmias:Atrial Fibrillation,  Signs/Symptoms:Shortness of                  Breath and Dyspnea; Risk Factors:Hypertension. Mild Aortic                  stenosis.  Sonographer:     Sherrie Sport RDCS (AE) Referring Phys:  [phone] CHRISTOPHER END Diagnosing Phys: Nelva Bush MD IMPRESSIONS  1. Left ventricular ejection fraction, by estimation, is 60 to 65%. The left ventricle has normal function. Left ventricular endocardial border not optimally defined to evaluate regional wall motion. There is moderate left ventricular hypertrophy, with severe focal basal hypertrophy of the septum. Left ventricular diastolic parameters are consistent with Grade II diastolic dysfunction (pseudonormalization). Elevated left atrial pressure.  2. Right ventricular systolic function is mildly reduced. The right ventricular size is normal. Tricuspid regurgitation signal is inadequate for assessing PA pressure.  3. Left atrial size was mildly dilated.  4. The mitral valve is grossly normal. No evidence of mitral valve regurgitation. No evidence of mitral stenosis.  5. The aortic valve is tricuspid. Aortic valve regurgitation is not visualized. Mild to moderate aortic valve stenosis, though degree of stenosis may be underestimated due to poor Doppler envelopes. FINDINGS  Left Ventricle: Left ventricular ejection fraction, by estimation, is 60 to 65%. The left ventricle has normal function. Left ventricular endocardial border not optimally defined to evaluate regional wall motion. The left ventricular internal cavity size was normal in size. There is moderate left ventricular hypertrophy, with severe focal basal hypertrophy of the septum. Left ventricular diastolic parameters are consistent with Grade II diastolic dysfunction (pseudonormalization). Elevated left atrial pressure. Right Ventricle: The right ventricular size is normal. Right vetricular wall thickness was not assessed. Right ventricular systolic function is mildly reduced. Tricuspid regurgitation signal is  inadequate for assessing PA pressure. Left Atrium: Left atrial size was mildly dilated. Right Atrium: Right atrial size was normal in size. Pericardium: The pericardium was not well  visualized. Mitral Valve: The mitral valve is grossly normal. No evidence of mitral valve regurgitation. No evidence of mitral valve stenosis. Tricuspid Valve: The tricuspid valve is not well visualized. Tricuspid valve regurgitation is trivial. Aortic Valve: The aortic valve is tricuspid. . There is moderate thickening and moderate calcification of the aortic valve. Aortic valve regurgitation is not visualized. Mild to moderate aortic stenosis is present, though degree of stenosis is likely underestimated due to poor Doppler envelopes. There is moderate thickening of the aortic valve. There is moderate calcification of the aortic valve. Aortic valve mean gradient measures 8.0 mmHg. Aortic valve peak gradient measures 16.5 mmHg. Aortic valve  area, by VTI measures 1.72 cm. Pulmonic Valve: The pulmonic valve was not well visualized. Pulmonic valve regurgitation is not visualized. No evidence of pulmonic stenosis. Aorta: The aortic root is normal in size and structure. Pulmonary Artery: The pulmonary artery is not well seen. Venous: The inferior vena cava was not well visualized. IAS/Shunts: The interatrial septum was not well visualized.  LEFT VENTRICLE PLAX 2D LVIDd:         4.10 cm  Diastology LVIDs:         2.63 cm  LV e' lateral:   7.83 cm/s LV PW:         1.40 cm  LV E/e' lateral: 13.0 LV IVS:        1.94 cm  LV e' medial:    5.55 cm/s LVOT diam:     2.00 cm  LV E/e' medial:  18.4 LV SV:         68 LV SV Index:   35 LVOT Area:     3.14 cm  RIGHT VENTRICLE TAPSE (M-mode): 1.6 cm LEFT ATRIUM             Index       RIGHT ATRIUM           Index LA diam:        4.20 cm 2.15 cm/m  RA Area:     14.00 cm LA Vol (A2C):   26.3 ml 13.46 ml/m RA Volume:   32.70 ml  16.73 ml/m LA Vol (A4C):   59.5 ml 30.45 ml/m LA Biplane Vol: 43.2 ml  22.11 ml/m  AORTIC VALVE                    PULMONIC VALVE AV Area (Vmax):    1.20 cm     PV Vmax:        1.01 m/s AV Area (Vmean):   1.27 cm     PV Peak grad:   4.1 mmHg AV Area (VTI):     1.72 cm     RVOT Peak grad: 6 mmHg AV Vmax:           203.33 cm/s AV Vmean:          126.000 cm/s AV VTI:            0.399 m AV Peak Grad:      16.5 mmHg AV Mean Grad:      8.0 mmHg LVOT Vmax:         77.60 cm/s LVOT Vmean:        50.900 cm/s LVOT VTI:          0.218 m LVOT/AV VTI ratio: 0.55  AORTA Ao Root diam: 2.80 cm MITRAL VALVE MV Area (PHT): 2.60 cm     SHUNTS MV Decel Time: 292 msec     Systemic VTI:  0.22 m MV E velocity: 102.00 cm/s  Systemic Diam: 2.00 cm MV A velocity: 78.60 cm/s MV E/A ratio:  1.30 Nelva Bush MD Electronically signed by Nelva Bush MD Signature Date/Time: 12/26/2019/3:54:01 PM    Final     Cardiac Studies   TTE (07/26/19): 1. Left ventricular ejection fraction, by estimation, is 60 to 65%. The  left ventricle has normal function. The left ventricle has no regional  wall motion abnormalities. There is mild left ventricular hypertrophy.  Left ventricular diastolic parameters  are consistent with Grade II diastolic dysfunction (pseudonormalization).  2. Right ventricular systolic function is normal. The right ventricular  size is normal.  3. Left atrial size was severely dilated.  4. The mitral valve is grossly normal. No evidence of mitral valve  regurgitation.  5. Heavily calcified aortic valve. DVI = 0.35, peak gradient 1mmHg, mean  gradient 68mmHg, AVA 1.35cm2. The aortic valve is tricuspid. Aortic valve  regurgitation is not visualized. Moderate aortic valve stenosis.  6. The inferior vena cava is normal in size with greater than 50%  respiratory variability, suggesting right atrial pressure of 3 mmHg.  Patient Profile     74 y.o. male with history of CAD status post CABG 2012, PCI to RCA, carotid artery stenosis status post left CEA 2017, hypertension,  diabetes, paroxysmal atrial fibrillation being seen for A. fib RVR  Assessment & Plan    1. Atrial fibrillation with rapid ventricular response: Converted to sinus rhythm with amiodarone drip.  He was taking amiodarone 100 mg once daily at home. Given recurrent episodes of atrial fibrillation, I am going to resume oral amiodarone 200 mg twice daily for 1 week followed by 200 mg once daily after that. Resume anticoagulation with warfarin. I will schedule the patient for outpatient consultation with EP regarding possible need for pacemaker in order to effectively treat his atrial fibrillation.  He has underlying sinus bradycardia.  2. Elevated troponins -Secondary to demand ischemia -No invasive work-up for stress testing planned at this point.  3. History of CAD, CABG -Continue Plavix, statin,  4.  Anemia: Followed by hematology.  Hemoglobin is 8.8.  5.  Diarrhea: Negative C. difficile.  Continue Imodium as needed.      Signed, Kathlyn Sacramento, MD  12/28/2019, 10:12 AM

## 2019-12-28 NOTE — Plan of Care (Signed)
Discharge instructions provided to pt.  All questions addressed.  Understanding verified through teach back.  Awaiting transportation home via POV. ° ° °Problem: Education: °Goal: Knowledge of General Education information will improve °Description: Including pain rating scale, medication(s)/side effects and non-pharmacologic comfort measures °Outcome: Adequate for Discharge °  °Problem: Health Behavior/Discharge Planning: °Goal: Ability to manage health-related needs will improve °Outcome: Adequate for Discharge °  °Problem: Clinical Measurements: °Goal: Ability to maintain clinical measurements within normal limits will improve °Outcome: Adequate for Discharge °Goal: Will remain free from infection °Outcome: Adequate for Discharge °Goal: Diagnostic test results will improve °Outcome: Adequate for Discharge °Goal: Respiratory complications will improve °Outcome: Adequate for Discharge °Goal: Cardiovascular complication will be avoided °Outcome: Adequate for Discharge °  °Problem: Activity: °Goal: Risk for activity intolerance will decrease °Outcome: Adequate for Discharge °  °Problem: Nutrition: °Goal: Adequate nutrition will be maintained °Outcome: Adequate for Discharge °  °Problem: Coping: °Goal: Level of anxiety will decrease °Outcome: Adequate for Discharge °  °Problem: Elimination: °Goal: Will not experience complications related to bowel motility °Outcome: Adequate for Discharge °Goal: Will not experience complications related to urinary retention °Outcome: Adequate for Discharge °  °Problem: Pain Managment: °Goal: General experience of comfort will improve °Outcome: Adequate for Discharge °  °Problem: Safety: °Goal: Ability to remain free from injury will improve °Outcome: Adequate for Discharge °  °Problem: Skin Integrity: °Goal: Risk for impaired skin integrity will decrease °Outcome: Adequate for Discharge °  °

## 2019-12-28 NOTE — Progress Notes (Signed)
QTc measuring 0.58-0.6 sec on cardiac monitoring.  Dr. Fletcher Anon and Dr. Posey Pronto notified.  12-lead EKG being completed.

## 2019-12-28 NOTE — Discharge Instructions (Signed)

## 2019-12-28 NOTE — Progress Notes (Signed)
[facility] for Warfarin  Indication: atrial fibrillation  No Known Allergies  Patient Measurements: Height: 5\' 8"  (172.7 cm) Weight: 82.6 kg (182 lb 3.2 oz) IBW/kg (Calculated) : 68.4 Heparin Dosing Weight:   Vital Signs: Temp: 98.6 F (37 C) (08/05 0437) Temp Source: Oral (08/05 0437) BP: 144/49 (08/05 0437) Pulse Rate: 55 (08/05 0437)  Labs: Recent Labs    12/25/19 2323 12/25/19 2323 12/26/19 0126 12/26/19 0715 12/26/19 0813 12/26/19 1738 12/27/19 0008 12/28/19 0556  HGB 10.5*   < >  --   --  10.1*  --  8.8*  --   HCT 33.0*  --   --   --  30.6*  --  27.3*  --   PLT 182  --   --   --  182  --  165  --   LABPROT 20.2*  --    < > 22.9*  --   --  30.4* 27.2*  INR 1.8*  --    < > 2.1*  --   --  3.0* 2.6*  CREATININE 1.33*  --   --   --  1.29*  --  1.33*  --   TROPONINIHS 15  --    < > 754*  --  809* 499*  --    < > = values in this interval not displayed.    Estimated Creatinine Clearance: 51.8 mL/min (A) (by C-G formula based on SCr of 1.33 mg/dL (H)).   Medical History: Past Medical History:  Diagnosis Date  . Anemia 08/2015   received 2 units rbc one week ago, 3 IV iron infusion -last 1 week.  . Basal cell carcinoma of skin 2012   removed several spots from arms and back  . CAD (coronary artery disease)    a. 01/2010 CABG x 4: LIMA->LAD, VG->D1, VG->OM1, VG->PDA. b. NSTEMI 02/2012 in setting of AF-RVR with cath s/p BMS to RCA (plan for 1 month, possibly up to 3 months of Plavix)  . Carotid disease, bilateral ([place])    a. 03/2011 U/S: 40-59% bilat Carotid dzs;  b. 04/2015 Carotid U/S: 40-59% bilat ICA stenosis; c. 05/2015 CTA Neck: 08% RICA, 14% LICA, mod-marked R vertebral stenosis, mod L vertebral stenosis.  . Chronic kidney disease    small obtusion per pcp. ultrasound done on 08/29/2015  . Diabetes mellitus type II, controlled ([place])    a. Variable CBG 02/2012 - several meds adjusted.  Marland Kitchen Dysrhythmia    intermittent Atrial  Fibrillation  . GERD (gastroesophageal reflux disease)   . Heart murmur   . Hyperlipidemia   . Hypertension   . Hypertensive heart disease   . Iron deficiency anemia    "gets infusions q once in awhile" (04/27/2018)  . Macular edema bil   lazer work done previously  . Mild aortic stenosis    a. 03/2011 Echo: EF 55-60%, Mild AS. b. Mild by cath 02/2012; c. 01/2014 Echo: EF 65-70%, Gr 1 DD, mild AS, mildly dil LA.  . Multiple gastric ulcers 2006  . Myocardial infarction ([place]) 02/2012   stents (x1) at time  . Myocardial infarction [facility])    "2nd one was after 02/2012; don't know date" (04/27/2018)  . Obesity   . On home oxygen therapy    "2.5 w/CPAP" (04/27/2018)  . OSA on CPAP    CPAP settings 3 with oxygen 2.5 (04/27/2018)  . PAF (paroxysmal atrial fibrillation) ([place])    a. Newly dx 02/2012 & initiated on Coumadin (spont converted  to NSR).  . Shortness of breath dyspnea   . Transfusion history    transfusions -1 month ago -2 units    Medications:  Medications Prior to Admission  Medication Sig Dispense Refill Last Dose  . acetaminophen (TYLENOL) 500 MG tablet Take 1,000 mg by mouth every 6 (six) hours as needed for moderate pain or headache.   prn at prn  . ALPRAZolam (XANAX) 0.25 MG tablet Take 0.25 mg by mouth daily as needed for anxiety.   prn at prn  . amiodarone (PACERONE) 200 MG tablet TAKE 1/2 TABLET AT BEDTIME 45 tablet 0 12/25/2019 at Unknown time  . amLODipine (NORVASC) 5 MG tablet Take one tablet by mouth one time daily 90 tablet 3 12/25/2019 at Unknown time  . clopidogrel (PLAVIX) 75 MG tablet TAKE 1 TABLET BY MOUTH DAILY 90 tablet 0 12/25/2019 at Unknown time  . Cyanocobalamin (B-12) 1000 MCG CAPS Take 1,000 mcg by mouth every evening.    12/25/2019 at Unknown time  . Ferrous Gluconate-C-Folic Acid (IRON-C PO) Take 1 tablet by mouth daily.   12/25/2019 at Unknown time  . furosemide (LASIX) 20 MG tablet Take 1 tablet (20 mg total) by mouth daily. (Patient taking differently: Take 20  mg by mouth daily as needed. ) 30 tablet 6 prn at prn  . glimepiride (AMARYL) 4 MG tablet Take 4 mg by mouth daily.    12/25/2019 at Unknown time  . insulin glargine (LANTUS) 100 UNIT/ML injection Inject 15 Units into the skin daily.    12/25/2019 at Unknown time  . NOVOLOG FLEXPEN 100 UNIT/ML FlexPen Inject 6-36 Units into the skin 2 (two) times daily with a meal.    12/25/2019 at Unknown time  . omega-3 acid ethyl esters (LOVAZA) 1 g capsule Take 1 capsule (1 g total) by mouth daily. 90 capsule 4 12/25/2019 at Unknown time  . Omega-3 Fatty Acids (FISH OIL) 1000 MG CAPS Take 1,000 mg by mouth every evening.   12/25/2019 at Unknown time  . OXYGEN Inhale 2.5 L/min into the lungs at bedtime. With CPAP   12/25/2019 at Unknown time  . pantoprazole (PROTONIX) 40 MG tablet Take 1 tablet (40 mg total) by mouth daily. 90 tablet 3 12/25/2019 at Unknown time  . Polyethylene Glycol 400 (BLINK TEARS OP) Place 1 drop into both eyes daily as needed (dry eyes).   prn at prn  . ramipril (ALTACE) 10 MG capsule Take 10 mg by mouth daily.    12/25/2019 at Unknown time  . rosuvastatin (CRESTOR) 40 MG tablet TAKE ONE TABLET BY MOUTH EVERY DAY 90 tablet 2 12/25/2019 at Unknown time  . sucralfate (CARAFATE) 1 g tablet Take 1 g by mouth 2 (two) times daily.   12/25/2019 at Unknown time  . vitamin C (ASCORBIC ACID) 500 MG tablet Take 500 mg by mouth every evening.   12/25/2019 at Unknown time  . warfarin (COUMADIN) 5 MG tablet Take 2.5-5 mg by mouth See admin instructions. Take 5 mg at night on Sun, Mon, Wed, Thur, and Fri.  Take 2.5 mg at night on Tues and Sat   12/25/2019 at 1945  . nitroGLYCERIN (NITROSTAT) 0.4 MG SL tablet PLACE 1 TABLET UNDER THE TONGUE EVERY 5 MINUTES FOR UP TO 3 DOSES AS NEEDED FOR CHEST PAINS, IF NO RELIEF CALL 911 25 tablet 0 prn at prn  . tadalafil (CIALIS) 10 MG tablet Take 10 mg by mouth as needed.     . zinc gluconate 50 MG tablet Take 50 mg  by mouth daily. (Patient not taking: Reported on 12/26/2019)   Not Taking at  Unknown time    Assessment: Pharmacy consulted to dose warfarin in this 74 year old male with rapid afib.  Pt was on warfarin 2.5 mg PO every Tues and Sat and warfarin 5 mg PO Q Sun, Mon, Wed, Thurs, Fri Last dose of warfarin was 8/2 @ ~ 20:00  8/2  INR 1.8 @ 2323  (took ?5 mg PTA at 1945)    Date INR Warfarin Dose  8/3 2.1 5 mg x 1 @ 0117  8/4 3 HOLD  8/5 2.6 5 mg   Hgb trending down 10.1 > 8.8. - continue to monitor.   Goal of Therapy:  INR 2-3 Monitor platelets by anticoagulation protocol: Yes   Plan:  INR is therapeutic. Resume warfarin per cards. Will give warfarin 5 mg x 1 (home regimen). Daily INR ordered. CBC at least every 3 days. DDI: Amiodarone dose adjusted to 200 mg BID may need to adjust warfarin home regimen, as this can increase INR.     Oswald Hillock, PharmD, BCPS 12/28/2019,11:54 AM

## 2019-12-29 ENCOUNTER — Telehealth: Payer: Self-pay | Admitting: Cardiovascular Disease

## 2019-12-29 NOTE — Telephone Encounter (Signed)
-----   Message from Wellington Hampshire, MD sent at 12/28/2019 10:26 AM EDT ----- Possible discharge home from [facility] today for A. fib.  TCM follow-up needed with me or APP in 1 to 2 weeks. Please also schedule an outpatient consultation with Dr. Caryl Comes or Dr. Quentin Ore for A. fib and bradycardia.

## 2019-12-29 NOTE — Telephone Encounter (Signed)
TCM....  Patient is being discharged   They saw Fletcher Anon  They are scheduled to see      Fletcher Anon 8/17 and Quentin Ore 8/18  They were seen for Afib Loletha Grayer   They need to be seen within 1-2 weeks      Please call

## 2020-01-01 ENCOUNTER — Telehealth: Payer: Self-pay | Admitting: *Deleted

## 2020-01-01 DIAGNOSIS — I4891 Unspecified atrial fibrillation: Secondary | ICD-10-CM | POA: Diagnosis not present

## 2020-01-01 DIAGNOSIS — I4892 Unspecified atrial flutter: Secondary | ICD-10-CM | POA: Diagnosis not present

## 2020-01-01 DIAGNOSIS — D500 Iron deficiency anemia secondary to blood loss (chronic): Secondary | ICD-10-CM | POA: Diagnosis not present

## 2020-01-01 NOTE — Telephone Encounter (Signed)
Patient called back to request his apts be moved up. apts moved to 8/16 per patient's request.

## 2020-01-01 NOTE — Telephone Encounter (Signed)
Dr Sabra Heck office called and states patient is in afib and has blood loss, and Dr Sabra Heck wants to know if he can get his appt moved up sooner because he needs iron infusion. THANKS

## 2020-01-01 NOTE — Telephone Encounter (Signed)
Patient contacted regarding discharge from [facility] on 12/28/19.  Patient understands to follow up with provider Dr. Fletcher Anon on 01/09/20 at 4:20pm at the [facility] office. Patient understands discharge instructions? yes Patient understands medications and regiment? yes Patient understands to bring all medications to this visit? yes  Ask patient:  Are you enrolled in My Chart (yes or no)  yesy    For patients Patient sts that he was told by his dietician that he may need to consider stopping fish oil since he currently on blood thinners. Patient takes OTC fish oil.  Patient rqst a message be sent to Dr. Fletcher Anon to get his thoughts.

## 2020-01-03 DIAGNOSIS — G4733 Obstructive sleep apnea (adult) (pediatric): Secondary | ICD-10-CM | POA: Diagnosis not present

## 2020-01-03 NOTE — Telephone Encounter (Signed)
The interaction is not significant enough to stop.

## 2020-01-03 NOTE — Telephone Encounter (Signed)
Spoke with the patient and made him aware of Dr. Tyrell Antonio response and recommendation. Patient verbalized understanding and voiced appreciation for the call back.

## 2020-01-04 ENCOUNTER — Telehealth: Payer: Self-pay | Admitting: Cardiovascular Disease

## 2020-01-04 DIAGNOSIS — R002 Palpitations: Secondary | ICD-10-CM | POA: Diagnosis not present

## 2020-01-04 DIAGNOSIS — I959 Hypotension, unspecified: Secondary | ICD-10-CM | POA: Diagnosis not present

## 2020-01-04 DIAGNOSIS — I4891 Unspecified atrial fibrillation: Secondary | ICD-10-CM | POA: Diagnosis not present

## 2020-01-04 DIAGNOSIS — R0989 Other specified symptoms and signs involving the circulatory and respiratory systems: Secondary | ICD-10-CM | POA: Diagnosis not present

## 2020-01-04 NOTE — Telephone Encounter (Signed)
Patient c/o Palpitations:  High priority if patient c/o lightheadedness, shortness of breath, or chest pain  1) How long have you had palpitations/irregular HR/ Afib? Are you having the symptoms now? Went into a fib this morning, 45 minutes. Symptoms have stopped  2) Are you currently experiencing lightheadedness, SOB or CP? no  3) Do you have a history of afib (atrial fibrillation) or irregular heart rhythm? yes  4) Have you checked your BP or HR? (document readings if available): n/a  5) Are you experiencing any other symptoms? Not now, emergency response came to the home and he went back into rhythm   Wife calling in with little information, she was out of town during the event. Seth Hardy can be called for further details  Patient has appointment next week but wife wanted [name] to be aware

## 2020-01-04 NOTE — Telephone Encounter (Signed)
Spoke with the patient. Patient sts that he had his sleep study last night and was not able to use his cpap machine during the night.  This morning around 7:30am he went in to AFIB. Patient sts that he took 100mg  of Amiodarone and called EMS. He is taking Amidarone 100 mg bid.When EMS arrived he had already converted back to NSR. Patients VS 117/54 62 bpm.  Patient is currently asymptomatic. Adv the patient that the AFIB episodes was probably brought on by osa and not being able to use his cpap last night. Adv him that I didn't think any action was required.  Adv the patient that he should continue his medications as prescribed and keep his 01/09/20 appt with Dr. Fletcher Anon.  Adv the patient that I will fwd the update to Dr. Fletcher Anon and call back if he has additional recommendations. Patient agreeable with the plana and voiced appreciation for the call back.

## 2020-01-07 DIAGNOSIS — G4733 Obstructive sleep apnea (adult) (pediatric): Secondary | ICD-10-CM | POA: Diagnosis not present

## 2020-01-07 NOTE — Progress Notes (Signed)
Cardiology Office Note:    Date:  01/10/2020   ID:  Seth Hardy, DOB 1945-06-29, MRN [id number]  PCP:  Rusty Aus, MD  [facility] Cardiologist:  Kathlyn Sacramento, MD  [facility] Electrophysiologist:  Vickie Epley, MD   Referring MD: Rusty Aus, MD    AF and bleeding on [facility] and bradycardia  History of Present Illness:    Seth Hardy is a 74 y.o. male with a hx of CAD on plavix, AF on amiodarone and coumadin who is being seen today for AF management and related bradycardia.  He was recently admitted 8/2 through 8/5 for AF w/ RVR.  Converted to sinus bradycardia on diltiazem so this was stopped. Discharged on amiodarone.   Complicating his history is anemia with baseline Hgb in the 8-10 range although it was recently less than 7. He is awaiting a GI workup. Oncology is following and is planning for bone marrow biopsy.  The patient and his wife tell me that episodes of A. fib and angina have usually occurred in the setting of a low hemoglobin count although last night was an episode of angina with a normal heart rate in the 70s based on his apple watch.     Past Medical History:  Diagnosis Date  . Anemia 08/2015   received 2 units rbc one week ago, 3 IV iron infusion -last 1 week.  . Basal cell carcinoma of skin 2012   removed several spots from arms and back  . CAD (coronary artery disease)    a. 01/2010 CABG x 4: LIMA->LAD, VG->D1, VG->OM1, VG->PDA. b. NSTEMI 02/2012 in setting of AF-RVR with cath s/p BMS to RCA (plan for 1 month, possibly up to 3 months of Plavix)  . Carotid disease, bilateral ([place])    a. 03/2011 U/S: 40-59% bilat Carotid dzs;  b. 04/2015 Carotid U/S: 40-59% bilat ICA stenosis; c. 05/2015 CTA Neck: 62% RICA, 69% LICA, mod-marked R vertebral stenosis, mod L vertebral stenosis.  . Chronic kidney disease    small obtusion per pcp. ultrasound done on 08/29/2015  . Diabetes mellitus type II, controlled ([place])    a. Variable CBG 02/2012 - several  meds adjusted.  Marland Kitchen Dysrhythmia    intermittent Atrial Fibrillation  . GERD (gastroesophageal reflux disease)   . Heart murmur   . Hyperlipidemia   . Hypertension   . Hypertensive heart disease   . Iron deficiency anemia    "gets infusions q once in awhile" (04/27/2018)  . Macular edema bil   lazer work done previously  . Mild aortic stenosis    a. 03/2011 Echo: EF 55-60%, Mild AS. b. Mild by cath 02/2012; c. 01/2014 Echo: EF 65-70%, Gr 1 DD, mild AS, mildly dil LA.  . Multiple gastric ulcers 2006  . Myocardial infarction ([place]) 02/2012   stents (x1) at time  . Myocardial infarction [facility])    "2nd one was after 02/2012; don't know date" (04/27/2018)  . Obesity   . On home oxygen therapy    "2.5 w/CPAP" (04/27/2018)  . OSA on CPAP    CPAP settings 3 with oxygen 2.5 (04/27/2018)  . PAF (paroxysmal atrial fibrillation) ([place])    a. Newly dx 02/2012 & initiated on Coumadin (spont converted to NSR).  . Shortness of breath dyspnea   . Transfusion history    transfusions -1 month ago -2 units    Past Surgical History:  Procedure Laterality Date  . ANGIOPLASTY    . BASAL CELL CARCINOMA EXCISION     "  shoulder, arm X 2"  . CARDIAC CATHETERIZATION  2014  . CARDIAC CATHETERIZATION  2021   [facility] x1 stent  . CATARACT EXTRACTION W/ INTRAOCULAR LENS  IMPLANT, BILATERAL Bilateral 2016  . COLONOSCOPY WITH PROPOFOL N/A 10/15/2015   Procedure: COLONOSCOPY WITH PROPOFOL;  Surgeon: Gatha Mayer, MD;  Location: [facility]COPY;  Service: Endoscopy;  Laterality: N/A;  . CORONARY ANGIOPLASTY     Dr. Fletcher Anon  . CORONARY ARTERY BYPASS GRAFT  2011   CABG X3-4; [place], [place], [place],   . CORONARY ATHERECTOMY  04/27/2018  . CORONARY ATHERECTOMY N/A 04/27/2018   Procedure: CORONARY ATHERECTOMY;  Surgeon: Belva Crome, MD;  Location: [place] CV LAB;  Service: Cardiovascular;  Laterality: N/A;  . cyst L kidney  [place]  . ENDARTERECTOMY Left 08/30/2015   Procedure:  ENDARTERECTOMY CAROTID;  Surgeon: Katha Cabal, MD;  Location: [facility] ORS;  Service: Vascular;  Laterality: Left;  . ESOPHAGOGASTRODUODENOSCOPY (EGD) WITH PROPOFOL N/A 10/15/2015   Procedure: ESOPHAGOGASTRODUODENOSCOPY (EGD) WITH PROPOFOL;  Surgeon: Gatha Mayer, MD;  Location: [facility];  Service: Endoscopy;  Laterality: N/A;  . EYE SURGERY Bilateral    "has macular edema cauterized" (04/27/2018)  . INGUINAL HERNIA REPAIR Right   . LEFT HEART CATHETERIZATION WITH CORONARY ANGIOGRAM N/A 03/09/2012   Procedure: LEFT HEART CATHETERIZATION WITH CORONARY ANGIOGRAM;  Surgeon: Wellington Hampshire, MD;  Location: [place] CATH LAB;  Service: Cardiovascular;  Laterality: N/A;  . PERCUTANEOUS CORONARY STENT INTERVENTION (PCI-S)  03/09/2012   Procedure: PERCUTANEOUS CORONARY STENT INTERVENTION (PCI-S);  Surgeon: Wellington Hampshire, MD;  Location: [facility] CATH LAB;  Service: Cardiovascular;;  . RIGHT/LEFT HEART CATH AND CORONARY ANGIOGRAPHY N/A 04/25/2018   Procedure: RIGHT/LEFT HEART CATH AND CORONARY ANGIOGRAPHY;  Surgeon: Wellington Hampshire, MD;  Location: [place] CV LAB;  Service: Cardiovascular;  Laterality: N/A;    Current Medications: Current Meds  Medication Sig  . ALPRAZolam (XANAX) 0.25 MG tablet Take 0.25 mg by mouth daily as needed for anxiety.  Marland Kitchen amLODipine (NORVASC) 5 MG tablet Take one tablet by mouth one time daily  . clopidogrel (PLAVIX) 75 MG tablet TAKE 1 TABLET BY MOUTH DAILY  . Cyanocobalamin (B-12) 1000 MCG CAPS Take 1,000 mcg by mouth every evening.   . Ferrous Gluconate-C-Folic Acid (IRON-C PO) Take 1 tablet by mouth daily.  Marland Kitchen glimepiride (AMARYL) 4 MG tablet Take 4 mg by mouth daily.   . insulin glargine (LANTUS) 100 UNIT/ML injection Inject 15 Units into the skin daily.   . isosorbide mononitrate (IMDUR) 30 MG 24 hr tablet Take 1 tablet (30 mg total) by mouth daily.  Marland Kitchen NOVOLOG FLEXPEN 100 UNIT/ML FlexPen Inject 6-36 Units into the skin 2 (two) times daily with a meal.   . Omega-3  Fatty Acids (FISH OIL) 1000 MG CAPS Take 1,000 mg by mouth every evening.  . OXYGEN Inhale 2.5 L/min into the lungs at bedtime. With CPAP  . pantoprazole (PROTONIX) 40 MG tablet Take 1 tablet (40 mg total) by mouth daily.  . Polyethylene Glycol 400 (BLINK TEARS OP) Place 1 drop into both eyes daily as needed (dry eyes).  . ramipril (ALTACE) 10 MG capsule Take 10 mg by mouth daily.   . rosuvastatin (CRESTOR) 40 MG tablet TAKE ONE TABLET BY MOUTH EVERY DAY  . sucralfate (CARAFATE) 1 g tablet Take 1 g by mouth 2 (two) times daily.  . vitamin C (ASCORBIC ACID) 500 MG tablet Take 500 mg by mouth every evening.  Marland Kitchen  warfarin (COUMADIN) 5 MG tablet Take 2.5-5 mg by mouth See admin instructions. Take 5 mg at night on Sun, Mon, Wed, Thur, and Fri.  Take 2.5 mg at night on Tues and Sat  . zinc gluconate 50 MG tablet Take 50 mg by mouth daily.      Allergies:   Patient has no known allergies.   Social History   Socioeconomic History  . Marital status: Married    Spouse name: Not on file  . Number of children: 2  . Years of education: Not on file  . Highest education level: Not on file  Occupational History  . Occupation: retired  Tobacco Use  . Smoking status: Never Smoker  . Smokeless tobacco: Never Used  . Tobacco comment: tobacco use - no.no passive smoke in home  Vaping Use  . Vaping Use: Never used  Substance and Sexual Activity  . Alcohol use: Yes    Comment: 04/27/2018 "couple drinks/year; if that"  . Drug use: Never  . Sexual activity: Not Currently  Other Topics Concern  . Not on file  Social History Narrative   Full time. Gets regular exercise. Lives in [place] with his wife.  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   Social Determinants of Radio broadcast assistant Strain:   . Difficulty of Paying Living Expenses:   Food Insecurity:   . Worried About Charity fundraiser in the Last Year:   . Arboriculturist in the Last Year:   Transportation Needs:   . Lexicographer (Medical):   Marland Kitchen Lack of Transportation (Non-Medical):   Physical Activity:   . Days of Exercise per Week:   . Minutes of Exercise per Session:   Stress:   . Feeling of Stress :   Social Connections:   . Frequency of Communication with Friends and Family:   . Frequency of Social Gatherings with Friends and Family:   . Attends Religious Services:   . Active Member of Clubs or Organizations:   . Attends Archivist Meetings:   Marland Kitchen Marital Status:      Family History: [name] family history includes Bladder Cancer in his mother; COPD in his mother; Cancer in an other family member; Heart attack in his father; Kidney disease in his paternal uncle; Liver cancer in his maternal aunt; Lung cancer in his maternal aunt. There is no history of Prostate cancer or Kidney cancer.  ROS:   Please see the history of present illness.     All other systems reviewed and are negative.  EKGs/Labs/Other Studies Reviewed:    The following studies were reviewed today: Echo, ECG  EKG from 01/10/2020 at 8:37 AM was personally reviewed by me and shows sinus bradycardia.  There is no ST segment deviation.  12/26/2019 Echo . Left ventricular ejection fraction, by estimation, is 60 to 65%. The  left ventricle has normal function. Left ventricular endocardial border  not optimally defined to evaluate regional wall motion. There is moderate  left ventricular hypertrophy, with  severe focal basal hypertrophy of the septum. Left ventricular diastolic  parameters are consistent with Grade II diastolic dysfunction  (pseudonormalization). Elevated left atrial pressure.  2. Right ventricular systolic function is mildly reduced. The right  ventricular size is normal. Tricuspid regurgitation signal is inadequate  for assessing PA pressure.  3. Left atrial size was mildly dilated.  4. The mitral valve is grossly normal. No evidence of mitral valve  regurgitation. No evidence of mitral  stenosis.  5. The aortic valve is tricuspid. Aortic valve regurgitation is not  visualized. Mild to moderate aortic valve stenosis, though degree of  stenosis may be underestimated due to poor Doppler envelopes.    Recent Labs: 12/26/2019: TSH 3.237 12/28/2019: ALT 20; Magnesium 2.1 01/08/2020: BUN 25; Creatinine, Ser 1.59; Hemoglobin 7.2; Platelets 233; Potassium 4.8; Sodium 135  Recent Lipid Panel    Component Value Date/Time   CHOL 105 12/26/2019 0813   TRIG 123 12/26/2019 0813   HDL 36 (L) 12/26/2019 0813   CHOLHDL 2.9 12/26/2019 0813   VLDL 25 12/26/2019 0813   LDLCALC 44 12/26/2019 0813    Physical Exam:    VS:  BP 132/70   Pulse (!) 51   Ht 5' 8"  (1.727 m)   Wt 182 lb (82.6 kg)   SpO2 96%   BMI 27.67 kg/m     Wt Readings from Last 3 Encounters:  01/10/20 182 lb (82.6 kg)  01/09/20 182 lb (82.6 kg)  01/08/20 183 lb (83 kg)     GEN: Well nourished, well developed in no acute distress HEENT: Normal NECK: No JVD; No carotid bruits LYMPHATICS: No lymphadenopathy CARDIAC: RRR, 3/6 crescendo decrescendo murmur loudest at the right upper sternal border radiating to the carotids.  No rubs or gallops appreciated. RESPIRATORY:  Clear to auscultation without rales, wheezing or rhonchi  ABDOMEN: Soft, non-tender, non-distended MUSCULOSKELETAL:  No edema; No deformity  SKIN: Warm and dry NEUROLOGIC:  Alert and oriented x 3 PSYCHIATRIC:  Normal affect   ASSESSMENT:    1. Persistent atrial fibrillation ([place])   2. Sinus bradycardia   3. Normocytic anemia   4. Long term (current) use of anticoagulants    PLAN:    In order of problems listed above:  1. Persistent atrial fibrillation The patient was recently admitted with symptomatic persistent atrial fibrillation with rapid ventricular response and was discharged on amiodarone.  He is maintained in normal sinus rhythm on amiodarone although he continues to have very symptomatic breakthroughs of atrial fibrillation with  rapid ventricular response resulting in hospital admission.  Review of his echo shows a mildly dilated left atrium and at least moderate aortic stenosis.  Complicating his management is a severe macrocytic anemia requiring PRBC and iron infusions.  He continues to follow with oncology who is recommending bone marrow biopsy.  In light of the patient's anemia, I would favor initially pursuing pharmacologic therapy to help maintain sinus rhythm.  In light of previous bradycardic episodes on both antiarrhythmic and beta-blocker therapy, he will require a dual-chamber pacemaker.  Atrial lead is required for atrial diagnostics and to maintain AV synchrony.  Pacemaker also facilitate the addition of higher doses of beta-blockade which will be useful to help manage his manage his ongoing anginal episodes.    We will plan to bring him back for lab work 3 days prior to the pacemaker procedure to confirm stable hemoglobin level and to provide guidance on his Coumadin regimen.  Will need an INR level less than 3 and a hemoglobin level greater than 8 to safely perform surgery.  The risks, benefits, and alternatives were discussed with the patient today and the patient elected to proceed with pacemaker implant.  I encouraged Mr. Coach to go ahead and schedule his bone marrow biopsy with the oncologists to help [place] better understand the etiology of his anemia.  He is also scheduled to meet with the gastroenterology team to discuss further work-up of his anemia.   2.  Anticoagulant use complicated  by anemia The patient has a long history of anemia without a clear cause.  His medical history also includes coronary artery disease for which he takes Plavix.      Medication Adjustments/Labs and Tests Ordered: Current medicines are reviewed at length with the patient today.  Concerns regarding medicines are outlined above.  Orders Placed This Encounter  Procedures  . Basic Metabolic Panel (BMET)  . CBC w/Diff  .  Protime-INR  . EKG 12-Lead   No orders of the defined types were placed in this encounter.   Patient Instructions  Medication Instructions:  Your physician recommends that you continue on your current medications as directed. Please refer to the Current Medication list given to you today.  Labwork: None ordered.  Testing/Procedures: Your physician has recommended that you have a pacemaker inserted. A pacemaker is a small device that is placed under the skin of your chest or abdomen to help control abnormal heart rhythms. This device uses electrical pulses to prompt the heart to beat at a normal rate. Pacemakers are used to treat heart rhythms that are too slow. Wire (leads) are attached to the pacemaker that goes into the chambers of you heart. This is done in the hospital and usually requires and overnight stay. Please see the instruction sheet given to you today for more information.  Follow-Up:  SEE INSTRUCTION LETTER  Any Other Special Instructions Will Be Listed Below (If Applicable).  If you need a refill on your cardiac medications before your next appointment, please call your pharmacy.    Pacemaker Implantation, Adult Pacemaker implantation is a procedure to place a pacemaker inside your chest. A pacemaker is a small computer that sends electrical signals to the heart and helps your heart beat normally. A pacemaker also stores information about your heart rhythms. You may need pacemaker implantation if you:  Have a slow heartbeat (bradycardia).  Faint (syncope).  Have shortness of breath (dyspnea) due to heart problems. The pacemaker attaches to your heart through a wire, called a lead. Sometimes just one lead is needed. Other times, there will be two leads. There are two types of pacemakers:  Transvenous pacemaker. This type is placed under the skin or muscle of your chest. The lead goes through a vein in the chest area to reach the inside of the heart.  Epicardial  pacemaker. This type is placed under the skin or muscle of your chest or belly. The lead goes through your chest to the outside of the heart. Tell a health care provider about:  Any allergies you have.  All medicines you are taking, including vitamins, herbs, eye drops, creams, and over-the-counter medicines.  Any problems you or family members have had with anesthetic medicines.  Any blood or bone disorders you have.  Any surgeries you have had.  Any medical conditions you have.  Whether you are pregnant or may be pregnant. What are the risks? Generally, this is a safe procedure. However, problems may occur, including:  Infection.  Bleeding.  Failure of the pacemaker or the lead.  Collapse of a lung or bleeding into a lung.  Blood clot inside a blood vessel with a lead.  Damage to the heart.  Infection inside the heart (endocarditis).  Allergic reactions to medicines. What happens before the procedure? Staying hydrated Follow instructions from your health care provider about hydration, which may include:  Up to 2 hours before the procedure - you may continue to drink clear liquids, such as water, clear fruit juice, black  coffee, and plain tea. Eating and drinking restrictions Follow instructions from your health care provider about eating and drinking, which may include:  8 hours before the procedure - stop eating heavy meals or foods such as meat, fried foods, or fatty foods.  6 hours before the procedure - stop eating light meals or foods, such as toast or cereal.  6 hours before the procedure - stop drinking milk or drinks that contain milk.  2 hours before the procedure - stop drinking clear liquids. Medicines  Ask your health care provider about: ? Changing or stopping your regular medicines. This is especially important if you are taking diabetes medicines or blood thinners. ? Taking medicines such as aspirin and ibuprofen. These medicines can thin your  blood. Do not take these medicines before your procedure if your health care provider instructs you not to.  You may be given antibiotic medicine to help prevent infection. General instructions  You will have a heart evaluation. This may include an electrocardiogram (ECG), chest X-ray, and heart imaging (echocardiogram,  or echo) tests.  You will have blood tests.  Do not use any products that contain nicotine or tobacco, such as cigarettes and e-cigarettes. If you need help quitting, ask your health care provider.  Plan to have someone take you home from the hospital or clinic.  If you will be going home right after the procedure, plan to have someone with you for 24 hours.  Ask your health care provider how your surgical site will be marked or identified. What happens during the procedure?  To reduce your risk of infection: ? Your health care team will wash or sanitize their hands. ? Your skin will be washed with soap. ? Hair may be removed from the surgical area.  An IV tube will be inserted into one of your veins.  You will be given one or more of the following: ? A medicine to help you relax (sedative). ? A medicine to numb the area (local anesthetic). ? A medicine to make you fall asleep (general anesthetic).  If you are getting a transvenous pacemaker: ? An incision will be made in your upper chest. ? A pocket will be made for the pacemaker. It may be placed under the skin or between layers of muscle. ? The lead will be inserted into a blood vessel that returns to the heart. ? While X-rays are taken by an imaging machine (fluoroscopy), the lead will be advanced through the vein to the inside of your heart. ? The other end of the lead will be tunneled under the skin and attached to the pacemaker.  If you are getting an epicardial pacemaker: ? An incision will be made near your ribs or breastbone (sternum) for the lead. ? The lead will be attached to the outside of your  heart. ? Another incision will be made in your chest or upper belly to create a pocket for the pacemaker. ? The free end of the lead will be tunneled under the skin and attached to the pacemaker.  The transvenous or epicardial pacemaker will be tested. Imaging studies may be done to check the lead position.  The incisions will be closed with stitches (sutures), adhesive strips, or skin glue.  Bandages (dressing) will be placed over the incisions. The procedure may vary among health care providers and hospitals. What happens after the procedure?  Your blood pressure, heart rate, breathing rate, and blood oxygen level will be monitored until the medicines you were given  have worn off.  You will be given antibiotics and pain medicine.  ECG and chest x-rays will be done.  You will wear a continuous type of ECG (Holter monitor) to check your heart rhythm.  Your health care provider will program the pacemaker.  Do not drive for 24 hours if you received a sedative. This information is not intended to replace advice given to you by your health care provider. Make sure you discuss any questions you have with your health care provider. Document Revised: 01/28/2018 Document Reviewed: 10/23/2015 Elsevier Patient Education  2020 [place], Vickie Epley, MD  01/10/2020 1:02 PM    [place]

## 2020-01-07 NOTE — H&P (View-Only) (Signed)
Cardiology Office Note:    Date:  01/10/2020   ID:  Seth Hardy, DOB 09-25-1945, MRN [id number]  PCP:  Seth Aus, MD  [facility] Cardiologist:  Seth [place], MD  [facility] Electrophysiologist:  Seth Epley, MD   Referring MD: Seth Aus, MD    AF and bleeding on [facility] and bradycardia  History of Present Illness:    Seth Hardy is a 74 y.o. male with a hx of CAD on plavix, AF on amiodarone and coumadin who is being seen today for AF management and related bradycardia.  He was recently admitted 8/2 through 8/5 for AF w/ RVR.  Converted to sinus bradycardia on diltiazem so this was stopped. Discharged on amiodarone.   Complicating his history is anemia with baseline Hgb in the 8-10 range although it was recently less than 7. He is awaiting a GI workup. Oncology is following and is planning for bone marrow biopsy.  The patient and his wife tell me that episodes of A. fib and angina have usually occurred in the setting of a low hemoglobin count although last night was an episode of angina with a normal heart rate in the 70s based on his apple watch.     Past Medical History:  Diagnosis Date  . Anemia 08/2015   received 2 units rbc one week ago, 3 IV iron infusion -last 1 week.  . Basal cell carcinoma of skin 2012   removed several spots from arms and back  . CAD (coronary artery disease)    a. 01/2010 CABG x 4: LIMA->LAD, VG->D1, VG->OM1, VG->PDA. b. NSTEMI 02/2012 in setting of AF-RVR with cath s/p BMS to RCA (plan for 1 month, possibly up to 3 months of Plavix)  . Carotid disease, bilateral ([place])    a. 03/2011 U/S: 40-59% bilat Carotid dzs;  b. 04/2015 Carotid U/S: 40-59% bilat ICA stenosis; c. 05/2015 CTA Neck: 72% RICA, 53% LICA, mod-marked R vertebral stenosis, mod L vertebral stenosis.  . Chronic kidney disease    small obtusion per pcp. ultrasound done on 08/29/2015  . Diabetes mellitus type II, controlled ([place])    a. Variable CBG 02/2012 - several  meds adjusted.  Marland Kitchen Dysrhythmia    intermittent Atrial Fibrillation  . GERD (gastroesophageal reflux disease)   . Heart murmur   . Hyperlipidemia   . Hypertension   . Hypertensive heart disease   . Iron deficiency anemia    "gets infusions q once in awhile" (04/27/2018)  . Macular edema bil   lazer work done previously  . Mild aortic stenosis    a. 03/2011 Echo: EF 55-60%, Mild AS. b. Mild by cath 02/2012; c. 01/2014 Echo: EF 65-70%, Gr 1 DD, mild AS, mildly dil LA.  . Multiple gastric ulcers 2006  . Myocardial infarction ([place]) 02/2012   stents (x1) at time  . Myocardial infarction [facility])    "2nd one was after 02/2012; don't know date" (04/27/2018)  . Obesity   . On home oxygen therapy    "2.5 w/CPAP" (04/27/2018)  . OSA on CPAP    CPAP settings 3 with oxygen 2.5 (04/27/2018)  . PAF (paroxysmal atrial fibrillation) ([place])    a. Newly dx 02/2012 & initiated on Coumadin (spont converted to NSR).  . Shortness of breath dyspnea   . Transfusion history    transfusions -1 month ago -2 units    Past Surgical History:  Procedure Laterality Date  . ANGIOPLASTY    . BASAL CELL CARCINOMA EXCISION     "  shoulder, arm X 2"  . CARDIAC CATHETERIZATION  2014  . CARDIAC CATHETERIZATION  2021   [facility] x1 stent  . CATARACT EXTRACTION W/ INTRAOCULAR LENS  IMPLANT, BILATERAL Bilateral 2016  . COLONOSCOPY WITH PROPOFOL N/A 10/15/2015   Procedure: COLONOSCOPY WITH PROPOFOL;  Surgeon: Gatha Mayer, MD;  Location: [facility] ENDOSCOPY;  Service: Endoscopy;  Laterality: N/A;  . CORONARY ANGIOPLASTY     Dr. Fletcher Anon  . CORONARY ARTERY BYPASS GRAFT  2011   CABG X3-4; [place], [place], [place],   . CORONARY ATHERECTOMY  04/27/2018  . CORONARY ATHERECTOMY N/A 04/27/2018   Procedure: CORONARY ATHERECTOMY;  Surgeon: Belva Crome, MD;  Location: [place] CV LAB;  Service: Cardiovascular;  Laterality: N/A;  . cyst L kidney  [place]  . ENDARTERECTOMY Left 08/30/2015   Procedure:  ENDARTERECTOMY CAROTID;  Surgeon: Katha Cabal, MD;  Location: [facility] ORS;  Service: Vascular;  Laterality: Left;  . ESOPHAGOGASTRODUODENOSCOPY (EGD) WITH PROPOFOL N/A 10/15/2015   Procedure: ESOPHAGOGASTRODUODENOSCOPY (EGD) WITH PROPOFOL;  Surgeon: Gatha Mayer, MD;  Location: [facility];  Service: Endoscopy;  Laterality: N/A;  . EYE SURGERY Bilateral    "has macular edema cauterized" (04/27/2018)  . INGUINAL HERNIA REPAIR Right   . LEFT HEART CATHETERIZATION WITH CORONARY ANGIOGRAM N/A 03/09/2012   Procedure: LEFT HEART CATHETERIZATION WITH CORONARY ANGIOGRAM;  Surgeon: Wellington Hampshire, MD;  Location: [place] CATH LAB;  Service: Cardiovascular;  Laterality: N/A;  . PERCUTANEOUS CORONARY STENT INTERVENTION (PCI-S)  03/09/2012   Procedure: PERCUTANEOUS CORONARY STENT INTERVENTION (PCI-S);  Surgeon: Wellington Hampshire, MD;  Location: [facility] CATH LAB;  Service: Cardiovascular;;  . RIGHT/LEFT HEART CATH AND CORONARY ANGIOGRAPHY N/A 04/25/2018   Procedure: RIGHT/LEFT HEART CATH AND CORONARY ANGIOGRAPHY;  Surgeon: Wellington Hampshire, MD;  Location: [place] CV LAB;  Service: Cardiovascular;  Laterality: N/A;    Current Medications: Current Meds  Medication Sig  . ALPRAZolam (XANAX) 0.25 MG tablet Take 0.25 mg by mouth daily as needed for anxiety.  Marland Kitchen amLODipine (NORVASC) 5 MG tablet Take one tablet by mouth one time daily  . clopidogrel (PLAVIX) 75 MG tablet TAKE 1 TABLET BY MOUTH DAILY  . Cyanocobalamin (B-12) 1000 MCG CAPS Take 1,000 mcg by mouth every evening.   . Ferrous Gluconate-C-Folic Acid (IRON-C PO) Take 1 tablet by mouth daily.  Marland Kitchen glimepiride (AMARYL) 4 MG tablet Take 4 mg by mouth daily.   . insulin glargine (LANTUS) 100 UNIT/ML injection Inject 15 Units into the skin daily.   . isosorbide mononitrate (IMDUR) 30 MG 24 hr tablet Take 1 tablet (30 mg total) by mouth daily.  Marland Kitchen NOVOLOG FLEXPEN 100 UNIT/ML FlexPen Inject 6-36 Units into the skin 2 (two) times daily with a meal.   . Omega-3  Fatty Acids (FISH OIL) 1000 MG CAPS Take 1,000 mg by mouth every evening.  . OXYGEN Inhale 2.5 L/min into the lungs at bedtime. With CPAP  . pantoprazole (PROTONIX) 40 MG tablet Take 1 tablet (40 mg total) by mouth daily.  . Polyethylene Glycol 400 (BLINK TEARS OP) Place 1 drop into both eyes daily as needed (dry eyes).  . ramipril (ALTACE) 10 MG capsule Take 10 mg by mouth daily.   . rosuvastatin (CRESTOR) 40 MG tablet TAKE ONE TABLET BY MOUTH EVERY DAY  . sucralfate (CARAFATE) 1 g tablet Take 1 g by mouth 2 (two) times daily.  . vitamin C (ASCORBIC ACID) 500 MG tablet Take 500 mg by mouth every evening.  Marland Kitchen  warfarin (COUMADIN) 5 MG tablet Take 2.5-5 mg by mouth See admin instructions. Take 5 mg at night on Sun, Mon, Wed, Thur, and Fri.  Take 2.5 mg at night on Tues and Sat  . zinc gluconate 50 MG tablet Take 50 mg by mouth daily.      Allergies:   Patient has no known allergies.   Social History   Socioeconomic History  . Marital status: Married    Spouse name: Not on file  . Number of children: 2  . Years of education: Not on file  . Highest education level: Not on file  Occupational History  . Occupation: retired  Tobacco Use  . Smoking status: Never Smoker  . Smokeless tobacco: Never Used  . Tobacco comment: tobacco use - no.no passive smoke in home  Vaping Use  . Vaping Use: Never used  Substance and Sexual Activity  . Alcohol use: Yes    Comment: 04/27/2018 "couple drinks/year; if that"  . Drug use: Never  . Sexual activity: Not Currently  Other Topics Concern  . Not on file  Social History Narrative   Full time. Gets regular exercise. Lives in [place] with his wife.  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   Social Determinants of Radio broadcast assistant Strain:   . Difficulty of Paying Living Expenses:   Food Insecurity:   . Worried About Charity fundraiser in the Last Year:   . Arboriculturist in the Last Year:   Transportation Needs:   . Lexicographer (Medical):   Marland Kitchen Lack of Transportation (Non-Medical):   Physical Activity:   . Days of Exercise per Week:   . Minutes of Exercise per Session:   Stress:   . Feeling of Stress :   Social Connections:   . Frequency of Communication with Friends and Family:   . Frequency of Social Gatherings with Friends and Family:   . Attends Religious Services:   . Active Member of Clubs or Organizations:   . Attends Archivist Meetings:   Marland Kitchen Marital Status:      Family History: [name] family history includes Bladder Cancer in his mother; COPD in his mother; Cancer in an other family member; Heart attack in his father; Kidney disease in his paternal uncle; Liver cancer in his maternal aunt; Lung cancer in his maternal aunt. There is no history of Prostate cancer or Kidney cancer.  ROS:   Please see the history of present illness.     All other systems reviewed and are negative.  EKGs/Labs/Other Studies Reviewed:    The following studies were reviewed today: Echo, ECG  EKG from 01/10/2020 at 8:37 AM was personally reviewed by me and shows sinus bradycardia.  There is no ST segment deviation.  12/26/2019 Echo . Left ventricular ejection fraction, by estimation, is 60 to 65%. The  left ventricle has normal function. Left ventricular endocardial border  not optimally defined to evaluate regional wall motion. There is moderate  left ventricular hypertrophy, with  severe focal basal hypertrophy of the septum. Left ventricular diastolic  parameters are consistent with Grade II diastolic dysfunction  (pseudonormalization). Elevated left atrial pressure.  2. Right ventricular systolic function is mildly reduced. The right  ventricular size is normal. Tricuspid regurgitation signal is inadequate  for assessing PA pressure.  3. Left atrial size was mildly dilated.  4. The mitral valve is grossly normal. No evidence of mitral valve  regurgitation. No evidence of mitral  stenosis.  5. The aortic valve is tricuspid. Aortic valve regurgitation is not  visualized. Mild to moderate aortic valve stenosis, though degree of  stenosis may be underestimated due to poor Doppler envelopes.    Recent Labs: 12/26/2019: TSH 3.237 12/28/2019: ALT 20; Magnesium 2.1 01/08/2020: BUN 25; Creatinine, Ser 1.59; Hemoglobin 7.2; Platelets 233; Potassium 4.8; Sodium 135  Recent Lipid Panel    Component Value Date/Time   CHOL 105 12/26/2019 0813   TRIG 123 12/26/2019 0813   HDL 36 (L) 12/26/2019 0813   CHOLHDL 2.9 12/26/2019 0813   VLDL 25 12/26/2019 0813   LDLCALC 44 12/26/2019 0813    Physical Exam:    VS:  BP 132/70   Pulse (!) 51   Ht 5' 8"  (1.727 m)   Wt 182 lb (82.6 kg)   SpO2 96%   BMI 27.67 kg/m     Wt Readings from Last 3 Encounters:  01/10/20 182 lb (82.6 kg)  01/09/20 182 lb (82.6 kg)  01/08/20 183 lb (83 kg)     GEN: Well nourished, well developed in no acute distress HEENT: Normal NECK: No JVD; No carotid bruits LYMPHATICS: No lymphadenopathy CARDIAC: RRR, 3/6 crescendo decrescendo murmur loudest at the right upper sternal border radiating to the carotids.  No rubs or gallops appreciated. RESPIRATORY:  Clear to auscultation without rales, wheezing or rhonchi  ABDOMEN: Soft, non-tender, non-distended MUSCULOSKELETAL:  No edema; No deformity  SKIN: Warm and dry NEUROLOGIC:  Alert and oriented x 3 PSYCHIATRIC:  Normal affect   ASSESSMENT:    1. Persistent atrial fibrillation ([place])   2. Sinus bradycardia   3. Normocytic anemia   4. Long term (current) use of anticoagulants    PLAN:    In order of problems listed above:  1. Persistent atrial fibrillation The patient was recently admitted with symptomatic persistent atrial fibrillation with rapid ventricular response and was discharged on amiodarone.  He is maintained in normal sinus rhythm on amiodarone although he continues to have very symptomatic breakthroughs of atrial fibrillation with  rapid ventricular response resulting in hospital admission.  Review of his echo shows a mildly dilated left atrium and at least moderate aortic stenosis.  Complicating his management is a severe macrocytic anemia requiring PRBC and iron infusions.  He continues to follow with oncology who is recommending bone marrow biopsy.  In light of the patient's anemia, I would favor initially pursuing pharmacologic therapy to help maintain sinus rhythm.  In light of previous bradycardic episodes on both antiarrhythmic and beta-blocker therapy, he will require a dual-chamber pacemaker.  Atrial lead is required for atrial diagnostics and to maintain AV synchrony.  Pacemaker also facilitate the addition of higher doses of beta-blockade which will be useful to help manage his manage his ongoing anginal episodes.    We will plan to bring him back for lab work 3 days prior to the pacemaker procedure to confirm stable hemoglobin level and to provide guidance on his Coumadin regimen.  Will need an INR level less than 3 and a hemoglobin level greater than 8 to safely perform surgery.  The risks, benefits, and alternatives were discussed with the patient today and the patient elected to proceed with pacemaker implant.  I encouraged Mr. Hartig to go ahead and schedule his bone marrow biopsy with the oncologists to help [place] better understand the etiology of his anemia.  He is also scheduled to meet with the gastroenterology team to discuss further work-up of his anemia.   2.  Anticoagulant use complicated  by anemia The patient has a long history of anemia without a clear cause.  His medical history also includes coronary artery disease for which he takes Plavix.      Medication Adjustments/Labs and Tests Ordered: Current medicines are reviewed at length with the patient today.  Concerns regarding medicines are outlined above.  Orders Placed This Encounter  Procedures  . Basic Metabolic Panel (BMET)  . CBC w/Diff  .  Protime-INR  . EKG 12-Lead   No orders of the defined types were placed in this encounter.   Patient Instructions  Medication Instructions:  Your physician recommends that you continue on your current medications as directed. Please refer to the Current Medication list given to you today.  Labwork: None ordered.  Testing/Procedures: Your physician has recommended that you have a pacemaker inserted. A pacemaker is a small device that is placed under the skin of your chest or abdomen to help control abnormal heart rhythms. This device uses electrical pulses to prompt the heart to beat at a normal rate. Pacemakers are used to treat heart rhythms that are too slow. Wire (leads) are attached to the pacemaker that goes into the chambers of you heart. This is done in the hospital and usually requires and overnight stay. Please see the instruction sheet given to you today for more information.  Follow-Up:  SEE INSTRUCTION LETTER  Any Other Special Instructions Will Be Listed Below (If Applicable).  If you need a refill on your cardiac medications before your next appointment, please call your pharmacy.    Pacemaker Implantation, Adult Pacemaker implantation is a procedure to place a pacemaker inside your chest. A pacemaker is a small computer that sends electrical signals to the heart and helps your heart beat normally. A pacemaker also stores information about your heart rhythms. You may need pacemaker implantation if you:  Have a slow heartbeat (bradycardia).  Faint (syncope).  Have shortness of breath (dyspnea) due to heart problems. The pacemaker attaches to your heart through a wire, called a lead. Sometimes just one lead is needed. Other times, there will be two leads. There are two types of pacemakers:  Transvenous pacemaker. This type is placed under the skin or muscle of your chest. The lead goes through a vein in the chest area to reach the inside of the heart.  Epicardial  pacemaker. This type is placed under the skin or muscle of your chest or belly. The lead goes through your chest to the outside of the heart. Tell a health care provider about:  Any allergies you have.  All medicines you are taking, including vitamins, herbs, eye drops, creams, and over-the-counter medicines.  Any problems you or family members have had with anesthetic medicines.  Any blood or bone disorders you have.  Any surgeries you have had.  Any medical conditions you have.  Whether you are pregnant or may be pregnant. What are the risks? Generally, this is a safe procedure. However, problems may occur, including:  Infection.  Bleeding.  Failure of the pacemaker or the lead.  Collapse of a lung or bleeding into a lung.  Blood clot inside a blood vessel with a lead.  Damage to the heart.  Infection inside the heart (endocarditis).  Allergic reactions to medicines. What happens before the procedure? Staying hydrated Follow instructions from your health care provider about hydration, which may include:  Up to 2 hours before the procedure - you may continue to drink clear liquids, such as water, clear fruit juice, black  coffee, and plain tea. Eating and drinking restrictions Follow instructions from your health care provider about eating and drinking, which may include:  8 hours before the procedure - stop eating heavy meals or foods such as meat, fried foods, or fatty foods.  6 hours before the procedure - stop eating light meals or foods, such as toast or cereal.  6 hours before the procedure - stop drinking milk or drinks that contain milk.  2 hours before the procedure - stop drinking clear liquids. Medicines  Ask your health care provider about: ? Changing or stopping your regular medicines. This is especially important if you are taking diabetes medicines or blood thinners. ? Taking medicines such as aspirin and ibuprofen. These medicines can thin your  blood. Do not take these medicines before your procedure if your health care provider instructs you not to.  You may be given antibiotic medicine to help prevent infection. General instructions  You will have a heart evaluation. This may include an electrocardiogram (ECG), chest X-ray, and heart imaging (echocardiogram,  or echo) tests.  You will have blood tests.  Do not use any products that contain nicotine or tobacco, such as cigarettes and e-cigarettes. If you need help quitting, ask your health care provider.  Plan to have someone take you home from the hospital or clinic.  If you will be going home right after the procedure, plan to have someone with you for 24 hours.  Ask your health care provider how your surgical site will be marked or identified. What happens during the procedure?  To reduce your risk of infection: ? Your health care team will wash or sanitize their hands. ? Your skin will be washed with soap. ? Hair may be removed from the surgical area.  An IV tube will be inserted into one of your veins.  You will be given one or more of the following: ? A medicine to help you relax (sedative). ? A medicine to numb the area (local anesthetic). ? A medicine to make you fall asleep (general anesthetic).  If you are getting a transvenous pacemaker: ? An incision will be made in your upper chest. ? A pocket will be made for the pacemaker. It may be placed under the skin or between layers of muscle. ? The lead will be inserted into a blood vessel that returns to the heart. ? While X-rays are taken by an imaging machine (fluoroscopy), the lead will be advanced through the vein to the inside of your heart. ? The other end of the lead will be tunneled under the skin and attached to the pacemaker.  If you are getting an epicardial pacemaker: ? An incision will be made near your ribs or breastbone (sternum) for the lead. ? The lead will be attached to the outside of your  heart. ? Another incision will be made in your chest or upper belly to create a pocket for the pacemaker. ? The free end of the lead will be tunneled under the skin and attached to the pacemaker.  The transvenous or epicardial pacemaker will be tested. Imaging studies may be done to check the lead position.  The incisions will be closed with stitches (sutures), adhesive strips, or skin glue.  Bandages (dressing) will be placed over the incisions. The procedure may vary among health care providers and hospitals. What happens after the procedure?  Your blood pressure, heart rate, breathing rate, and blood oxygen level will be monitored until the medicines you were given  have worn off.  You will be given antibiotics and pain medicine.  ECG and chest x-rays will be done.  You will wear a continuous type of ECG (Holter monitor) to check your heart rhythm.  Your health care provider will program the pacemaker.  Do not drive for 24 hours if you received a sedative. This information is not intended to replace advice given to you by your health care provider. Make sure you discuss any questions you have with your health care provider. Document Revised: 01/28/2018 Document Reviewed: 10/23/2015 Elsevier Patient Education  2020 [place], Seth Epley, MD  01/10/2020 1:02 PM    [place]

## 2020-01-08 ENCOUNTER — Inpatient Hospital Stay: Payer: PPO

## 2020-01-08 ENCOUNTER — Other Ambulatory Visit: Payer: Self-pay | Admitting: *Deleted

## 2020-01-08 ENCOUNTER — Other Ambulatory Visit: Payer: Self-pay

## 2020-01-08 ENCOUNTER — Encounter: Payer: Self-pay | Admitting: Internal Medicine

## 2020-01-08 ENCOUNTER — Inpatient Hospital Stay (HOSPITAL_BASED_OUTPATIENT_CLINIC_OR_DEPARTMENT_OTHER): Payer: PPO | Admitting: Internal Medicine

## 2020-01-08 ENCOUNTER — Inpatient Hospital Stay: Payer: PPO | Attending: Internal Medicine

## 2020-01-08 VITALS — BP 136/43 | HR 57 | Temp 99.0°F | Resp 18 | Ht 68.0 in | Wt 183.0 lb

## 2020-01-08 DIAGNOSIS — I2510 Atherosclerotic heart disease of native coronary artery without angina pectoris: Secondary | ICD-10-CM | POA: Diagnosis not present

## 2020-01-08 DIAGNOSIS — Z79899 Other long term (current) drug therapy: Secondary | ICD-10-CM | POA: Insufficient documentation

## 2020-01-08 DIAGNOSIS — I4891 Unspecified atrial fibrillation: Secondary | ICD-10-CM | POA: Insufficient documentation

## 2020-01-08 DIAGNOSIS — Z8249 Family history of ischemic heart disease and other diseases of the circulatory system: Secondary | ICD-10-CM | POA: Insufficient documentation

## 2020-01-08 DIAGNOSIS — E785 Hyperlipidemia, unspecified: Secondary | ICD-10-CM | POA: Diagnosis not present

## 2020-01-08 DIAGNOSIS — E119 Type 2 diabetes mellitus without complications: Secondary | ICD-10-CM | POA: Insufficient documentation

## 2020-01-08 DIAGNOSIS — Z836 Family history of other diseases of the respiratory system: Secondary | ICD-10-CM | POA: Insufficient documentation

## 2020-01-08 DIAGNOSIS — K922 Gastrointestinal hemorrhage, unspecified: Secondary | ICD-10-CM | POA: Insufficient documentation

## 2020-01-08 DIAGNOSIS — D500 Iron deficiency anemia secondary to blood loss (chronic): Secondary | ICD-10-CM

## 2020-01-08 DIAGNOSIS — Z8052 Family history of malignant neoplasm of bladder: Secondary | ICD-10-CM | POA: Insufficient documentation

## 2020-01-08 DIAGNOSIS — Z809 Family history of malignant neoplasm, unspecified: Secondary | ICD-10-CM | POA: Diagnosis not present

## 2020-01-08 DIAGNOSIS — M549 Dorsalgia, unspecified: Secondary | ICD-10-CM | POA: Insufficient documentation

## 2020-01-08 DIAGNOSIS — Z800 Family history of malignant neoplasm of digestive organs: Secondary | ICD-10-CM | POA: Diagnosis not present

## 2020-01-08 DIAGNOSIS — R5383 Other fatigue: Secondary | ICD-10-CM | POA: Insufficient documentation

## 2020-01-08 DIAGNOSIS — I252 Old myocardial infarction: Secondary | ICD-10-CM | POA: Insufficient documentation

## 2020-01-08 DIAGNOSIS — D72819 Decreased white blood cell count, unspecified: Secondary | ICD-10-CM | POA: Insufficient documentation

## 2020-01-08 DIAGNOSIS — K7460 Unspecified cirrhosis of liver: Secondary | ICD-10-CM | POA: Insufficient documentation

## 2020-01-08 DIAGNOSIS — D7589 Other specified diseases of blood and blood-forming organs: Secondary | ICD-10-CM | POA: Diagnosis not present

## 2020-01-08 DIAGNOSIS — Z801 Family history of malignant neoplasm of trachea, bronchus and lung: Secondary | ICD-10-CM | POA: Insufficient documentation

## 2020-01-08 DIAGNOSIS — D696 Thrombocytopenia, unspecified: Secondary | ICD-10-CM | POA: Diagnosis not present

## 2020-01-08 LAB — SAMPLE TO BLOOD BANK

## 2020-01-08 LAB — CBC WITH DIFFERENTIAL/PLATELET
Abs Immature Granulocytes: 0.01 10*3/uL (ref 0.00–0.07)
Basophils Absolute: 0 10*3/uL (ref 0.0–0.1)
Basophils Relative: 1 %
Eosinophils Absolute: 0 10*3/uL (ref 0.0–0.5)
Eosinophils Relative: 1 %
HCT: 22.4 % — ABNORMAL LOW (ref 39.0–52.0)
Hemoglobin: 7.2 g/dL — ABNORMAL LOW (ref 13.0–17.0)
Immature Granulocytes: 0 %
Lymphocytes Relative: 15 %
Lymphs Abs: 0.6 10*3/uL — ABNORMAL LOW (ref 0.7–4.0)
MCH: 33.5 pg (ref 26.0–34.0)
MCHC: 32.1 g/dL (ref 30.0–36.0)
MCV: 104.2 fL — ABNORMAL HIGH (ref 80.0–100.0)
Monocytes Absolute: 0.4 10*3/uL (ref 0.1–1.0)
Monocytes Relative: 10 %
Neutro Abs: 3.1 10*3/uL (ref 1.7–7.7)
Neutrophils Relative %: 73 %
Platelets: 233 10*3/uL (ref 150–400)
RBC: 2.15 MIL/uL — ABNORMAL LOW (ref 4.22–5.81)
RDW: 16.2 % — ABNORMAL HIGH (ref 11.5–15.5)
WBC: 4.2 10*3/uL (ref 4.0–10.5)
nRBC: 0 % (ref 0.0–0.2)

## 2020-01-08 LAB — IRON AND TIBC
Iron: 70 ug/dL (ref 45–182)
Saturation Ratios: 19 % (ref 17.9–39.5)
TIBC: 377 ug/dL (ref 250–450)
UIBC: 307 ug/dL

## 2020-01-08 LAB — BASIC METABOLIC PANEL
Anion gap: 8 (ref 5–15)
BUN: 25 mg/dL — ABNORMAL HIGH (ref 8–23)
CO2: 23 mmol/L (ref 22–32)
Calcium: 8.3 mg/dL — ABNORMAL LOW (ref 8.9–10.3)
Chloride: 104 mmol/L (ref 98–111)
Creatinine, Ser: 1.59 mg/dL — ABNORMAL HIGH (ref 0.61–1.24)
GFR calc Af Amer: 49 mL/min — ABNORMAL LOW (ref >60)
GFR calc non Af Amer: 42 mL/min — ABNORMAL LOW (ref >60)
Glucose, Bld: 229 mg/dL — ABNORMAL HIGH (ref 70–99)
Potassium: 4.8 mmol/L (ref 3.5–5.1)
Sodium: 135 mmol/L (ref 135–145)

## 2020-01-08 LAB — FERRITIN: Ferritin: 22 ng/mL — ABNORMAL LOW (ref 24–336)

## 2020-01-08 MED ORDER — SODIUM CHLORIDE 0.9 % IV SOLN
510.0000 mg | Freq: Once | INTRAVENOUS | Status: AC
  Administered 2020-01-08: 510 mg via INTRAVENOUS
  Filled 2020-01-08: qty 510

## 2020-01-08 MED ORDER — SODIUM CHLORIDE 0.9 % IV SOLN
Freq: Once | INTRAVENOUS | Status: AC
  Filled 2020-01-08: qty 250

## 2020-01-08 NOTE — Assessment & Plan Note (Addendum)
#  Macrocytic anemia-likely multifactorial anemia [iron deficient-likely secondary to gastric losses versus others-question cirrhosis versus bone marrow dysfunction [see below].    #Today hemoglobin 7.2; symptomatic. Proceed with IV Feraheme infusion today.  Also proceed with proceed with 1 unit PRBC transfusion 1 to 2 days.  Also await GI evaluation this week.  #Given the macrocytosis-in the context of iron deficiency without obvious GI loss [awaiting repeat GI evaluation]-I think it is reasonable to proceed with bone marrow biopsy to rule out any bone marrow dysfunction. Discussed with the patient the bone marrow biopsy and aspiration indication and procedure at length.  Given significant discomfort involved-I would recommend under anesthesia/with radiology in the hospital. I discussed the potential complications include-bleeding/trauma and risk of infection; which are fortunately very rare.  Patient is in agreement. Patient will sign the consent prior to the procedure.  Patient will inform us of his decision after discussion with cardiology [recent A. Fib/concerns of sedation]  #Intermittent leucopenia-today 4.2 ;thrombocytopenia-140-s150s-Today 155 Intermittent.? mild Cirrhosis [normal spleen] on June 2020 CT scan /liver disease.  Stable  #CAD [aspirin plus Plavix]; A. Fib [on Eliquis]-we will defer to cardiology regarding clearance for sedation for bone marrow biopsy.  Patient is appointment tomorrow.  Patient does not need to hold any of his anticoagulation/antiplatelet therapy prior to bone marrow biopsy.  # DISPOSITION:  # IV ferrahem today; # in 1week- IV ferrahem weekly x 2.  # 08/18- afternoon- HOLD tube; 1 unit PRBC transfusion # follow up in 3 weeks MD; labs-cbc/bmp;hold tube; possible ferrahem OR 1 unit PRBC-Dr.B

## 2020-01-08 NOTE — Progress Notes (Signed)
[facility] OFFICE PROGRESS NOTE  Patient Care Team: Rusty Aus, MD as PCP - General (Internal Medicine)   SUMMARY OF ONCOLOGIC HISTORY:  # MARCH 2017- April 2017- IRON DEFICIENCY ANEMIA ? Etiology s/p IV iron [EGD- ?June 2017- Bleeding gastric "polyp"/ colo- Dr.Gessner]; recommend CAPSULE STUDY [on HOLD sec to ?plavix]  # hx of A.fib- on coumadin-HOLD April 2017/ CAD [Dr.[name]]; Hx of gastric ulcer [Dr.Gessner];   TIA/ CEA [s/p April 2017]; OSA on CPAP.   History of present illness:   [name] patient is here for follow-up of his severe iron deficiency anemia-;also on Coumadin for A. fib is here for follow-up.  In the interim patient was admitted to hospital for A. fib with RVR; also started on Eliquis.  Patient hemoglobin has been above 8 during the hospitalization-not needing any blood transfusion.  Patient continues her chronic diarrhea.  Denies any blood in stools or black or stools.   Feels very tired.  No weight loss.  No nausea vomiting.  Review of Systems  Constitutional: Positive for malaise/fatigue. Negative for chills, diaphoresis and fever.  HENT: Negative for nosebleeds and sore throat.   Eyes: Negative for double vision.  Respiratory: Negative for cough, hemoptysis, sputum production and wheezing.   Cardiovascular: Negative for chest pain, palpitations, orthopnea and leg swelling.  Gastrointestinal: Positive for diarrhea. Negative for abdominal pain, blood in stool, constipation, heartburn, melena, nausea and vomiting.  Genitourinary: Negative for dysuria, frequency and urgency.  Musculoskeletal: Positive for back pain and joint pain.  Skin: Negative.  Negative for itching and rash.  Neurological: Negative for dizziness, tingling, focal weakness, weakness and headaches.  Endo/Heme/Allergies: Does not bruise/bleed easily.  Psychiatric/Behavioral: Negative for depression. The patient is not nervous/anxious and does not have insomnia.      Past Medical History:  Diagnosis Date  . Anemia 08/2015   received 2 units rbc one week ago, 3 IV iron infusion -last 1 week.  . Basal cell carcinoma of skin 2012   removed several spots from arms and back  . CAD (coronary artery disease)    a. 01/2010 CABG x 4: LIMA->LAD, VG->D1, VG->OM1, VG->PDA. b. NSTEMI 02/2012 in setting of AF-RVR with cath s/p BMS to RCA (plan for 1 month, possibly up to 3 months of Plavix)  . Carotid disease, bilateral ([place])    a. 03/2011 U/S: 40-59% bilat Carotid dzs;  b. 04/2015 Carotid U/S: 40-59% bilat ICA stenosis; c. 05/2015 CTA Neck: 26% RICA, 37% LICA, mod-marked R vertebral stenosis, mod L vertebral stenosis.  . Chronic kidney disease    small obtusion per pcp. ultrasound done on 08/29/2015  . Diabetes mellitus type II, controlled ([place])    a. Variable CBG 02/2012 - several meds adjusted.  Marland Kitchen Dysrhythmia    intermittent Atrial Fibrillation  . GERD (gastroesophageal reflux disease)   . Heart murmur   . Hyperlipidemia   . Hypertension   . Hypertensive heart disease   . Iron deficiency anemia    "gets infusions q once in awhile" (04/27/2018)  . Macular edema bil   lazer work done previously  . Mild aortic stenosis    a. 03/2011 Echo: EF 55-60%, Mild AS. b. Mild by cath 02/2012; c. 01/2014 Echo: EF 65-70%, Gr 1 DD, mild AS, mildly dil LA.  . Multiple gastric ulcers 2006  . Myocardial infarction ([place]) 02/2012   stents (x1) at time  . Myocardial infarction [facility])    "2nd one was after 02/2012; don't know date" (04/27/2018)  .  Obesity   . On home oxygen therapy    "2.5 w/CPAP" (04/27/2018)  . OSA on CPAP    CPAP settings 3 with oxygen 2.5 (04/27/2018)  . PAF (paroxysmal atrial fibrillation) ([place])    a. Newly dx 02/2012 & initiated on Coumadin (spont converted to NSR).  . Shortness of breath dyspnea   . Transfusion history    transfusions -1 month ago -2 units    Past Surgical History:  Procedure Laterality Date  . ANGIOPLASTY    . BASAL CELL  CARCINOMA EXCISION     "shoulder, arm X 2"  . CARDIAC CATHETERIZATION  Jul 25, 2012  . CARDIAC CATHETERIZATION  07-26-2019   [facility] x1 stent  . CATARACT EXTRACTION W/ INTRAOCULAR LENS  IMPLANT, BILATERAL Bilateral 07/25/14  . COLONOSCOPY WITH PROPOFOL N/A 10/15/2015   Procedure: COLONOSCOPY WITH PROPOFOL;  Surgeon: Gatha Mayer, MD;  Location: [facility] ENDOSCOPY;  Service: Endoscopy;  Laterality: N/A;  . CORONARY ANGIOPLASTY     Dr. Fletcher Anon  . CORONARY ARTERY BYPASS GRAFT  07/25/2009   CABG X3-4; [place], [place], [place],   . CORONARY ATHERECTOMY  04/27/2018  . CORONARY ATHERECTOMY N/A 04/27/2018   Procedure: CORONARY ATHERECTOMY;  Surgeon: Belva Crome, MD;  Location: [place] CV LAB;  Service: Cardiovascular;  Laterality: N/A;  . cyst L kidney  [place]  . ENDARTERECTOMY Left 08/30/2015   Procedure: ENDARTERECTOMY CAROTID;  Surgeon: Katha Cabal, MD;  Location: [facility] ORS;  Service: Vascular;  Laterality: Left;  . ESOPHAGOGASTRODUODENOSCOPY (EGD) WITH PROPOFOL N/A 10/15/2015   Procedure: ESOPHAGOGASTRODUODENOSCOPY (EGD) WITH PROPOFOL;  Surgeon: Gatha Mayer, MD;  Location: [facility];  Service: Endoscopy;  Laterality: N/A;  . EYE SURGERY Bilateral    "has macular edema cauterized" (04/27/2018)  . INGUINAL HERNIA REPAIR Right   . LEFT HEART CATHETERIZATION WITH CORONARY ANGIOGRAM N/A 03/09/2012   Procedure: LEFT HEART CATHETERIZATION WITH CORONARY ANGIOGRAM;  Surgeon: [place] Hampshire, MD;  Location: [facility] CATH LAB;  Service: Cardiovascular;  Laterality: N/A;  . PERCUTANEOUS CORONARY STENT INTERVENTION (PCI-S)  03/09/2012   Procedure: PERCUTANEOUS CORONARY STENT INTERVENTION (PCI-S);  Surgeon: [place] Hampshire, MD;  Location: [facility] CATH LAB;  Service: Cardiovascular;;  . RIGHT/LEFT HEART CATH AND CORONARY ANGIOGRAPHY N/A 04/25/2018   Procedure: RIGHT/LEFT HEART CATH AND CORONARY ANGIOGRAPHY;  Surgeon: [place] Hampshire, MD;  Location: [facility] CV LAB;  Service: Cardiovascular;   Laterality: N/A;    Family History  Problem Relation Age of Onset  . COPD Mother        alive 4  . Bladder Cancer Mother   . Heart attack Father        Jul 25, 2013 deceased  . Lung cancer Maternal Aunt   . Liver cancer Maternal Aunt   . Kidney disease Paternal Uncle   . Cancer Other        all paternal aunts and uncles  . Prostate cancer Neg Hx   . Kidney cancer Neg Hx     SOCIAL HISTORY:   Social History  Substance Use Topics  . Smoking status: Never Smoker   . Smokeless tobacco: Never Used     Comment: tobacco use - no  . Alcohol Use: Yes     Comment: rare drink    ALLERGIES:  has No Known Allergies.  MEDICATIONS:  Current Outpatient Prescriptions  Medication Sig Dispense Refill  . acetaminophen (TYLENOL) 325 MG tablet Take 650 mg by mouth every 6 (six) hours as needed. For pain    .  amiodarone (PACERONE) 200 MG tablet Take 0.5 tablets (100 mg total) by mouth daily. (Patient taking differently: Take 100 mg by mouth at bedtime. ) 45 tablet 3  . amLODipine (NORVASC) 5 MG tablet Take one tablet by mouth one time daily 90 tablet 3  . aspirin 81 MG EC tablet Take 81 mg by mouth daily.      Marland Kitchen atorvastatin (LIPITOR) 20 MG tablet Take 1 tablet (20 mg total) by mouth at bedtime. 90 tablet 3  . furosemide (LASIX) 20 MG tablet Take 1 tablet (20 mg total) by mouth as needed. 30 tablet 6  . glimepiride (AMARYL) 4 MG tablet Take 4 mg by mouth 2 (two) times daily.      . insulin glargine (LANTUS) 100 UNIT/ML injection Inject 20 Units into the skin daily.     . insulin lispro (HUMALOG) 100 UNIT/ML injection Inject 8 Units into the skin 2 (two) times daily with a meal. (Patient taking differently: Inject into the skin 3 (three) times daily. Per sliding scale.)    . metFORMIN (GLUCOPHAGE) 500 MG tablet Take 500 mg by mouth 2 (two) times daily with a meal.    . nitroGLYCERIN (NITROSTAT) 0.4 MG SL tablet Place 1 tablet (0.4 mg total) under the tongue every 5 (five) minutes as needed (up to 3  doses). 25 tablet 1  . OXYGEN Inhale 2.5 L/min into the lungs at bedtime. With CPAP    . pantoprazole (PROTONIX) 40 MG tablet Take 1 tablet (40 mg total) by mouth daily. (Patient taking differently: Take 40 mg by mouth 2 (two) times daily. ) 90 tablet 3  . ramipril (ALTACE) 10 MG capsule Take 1 capsule (10 mg total) by mouth daily. 90 capsule 5  . warfarin (COUMADIN) 5 MG tablet Take as directed by anticoagulation clinic 90 tablet 1  . IRON-VITAMIN C PO Take 1 tablet by mouth daily. Reported on 08/27/2015    . Omega-3 Fatty Acids (FISH OIL) 1200 MG CAPS Take 1 capsule by mouth daily. Reported on 08/27/2015     No current facility-administered medications for this visit.    PHYSICAL EXAMINATION:   BP 158/71 mmHg  Pulse 51  Temp(Src) 96.1 F (35.6 C)  Resp 18  Wt 192 lb 0.3 oz (87.1 kg)  Filed Weights   08/27/15 1015  Weight: 192 lb 0.3 oz (87.1 kg)    Physical Exam Constitutional:      Comments: Patient alone.  HENT:     Head: Normocephalic and atraumatic.     Mouth/Throat:     Pharynx: No oropharyngeal exudate.  Eyes:     Pupils: Pupils are equal, round, and reactive to light.  Cardiovascular:     Rate and Rhythm: Normal rate and regular rhythm.  Pulmonary:     Effort: No respiratory distress.     Breath sounds: No wheezing.  Abdominal:     General: Bowel sounds are normal. There is no distension.     Palpations: Abdomen is soft. There is no mass.     Tenderness: There is no abdominal tenderness. There is no guarding or rebound.  Musculoskeletal:        General: No tenderness. Normal range of motion.     Cervical back: Normal range of motion and neck supple.  Skin:    General: Skin is warm.  Neurological:     Mental Status: He is alert and oriented to person, place, and time.  Psychiatric:        Mood and Affect: Affect normal.  LABORATORY DATA:  I have reviewed the data as listed    Component Value Date/Time   NA 139 07/25/2015 1138   NA 139 01/22/2013  0348   K 4.1 07/25/2015 1138   K 4.2 01/22/2013 0348   CL 103 07/25/2015 1138   CL 106 01/22/2013 0348   CO2 27 07/25/2015 1138   CO2 28 01/22/2013 0348   GLUCOSE 216* 07/25/2015 1138   GLUCOSE 158* 01/22/2013 0348   BUN 14 07/25/2015 1138   BUN 16 01/22/2013 0348   CREATININE 1.03 07/25/2015 1138   CREATININE 1.02 01/22/2013 0348   CALCIUM 9.1 07/25/2015 1138   CALCIUM 8.6 01/22/2013 0348   PROT 7.2 06/25/2015 1045   PROT 7.5 01/21/2013 0209   ALBUMIN 4.1 06/25/2015 1045   ALBUMIN 4.3 01/21/2013 0209   AST 30 06/25/2015 1045   AST 28 01/21/2013 0209   ALT 33 06/25/2015 1045   ALT 36 01/21/2013 0209   ALKPHOS 104 06/25/2015 1045   ALKPHOS 143* 01/21/2013 0209   BILITOT 0.4 06/25/2015 1045   BILITOT 0.4 01/21/2013 0209   GFRNONAA >60 07/25/2015 1138   GFRNONAA >60 01/22/2013 0348   GFRAA >60 07/25/2015 1138   GFRAA >60 01/22/2013 0348    No results found for: SPEP, UPEP  Lab Results  Component Value Date   WBC 4.5 08/22/2015   NEUTROABS 2.5 08/22/2015   HGB 8.0 Repeated and verified X2.* 08/22/2015   HCT 26.2 Repeated and verified X2.* 08/22/2015   MCV 67.4 Repeated and verified X2.* 08/22/2015   PLT 264.0 08/22/2015      Chemistry      Component Value Date/Time   NA 139 07/25/2015 1138   NA 139 01/22/2013 0348   K 4.1 07/25/2015 1138   K 4.2 01/22/2013 0348   CL 103 07/25/2015 1138   CL 106 01/22/2013 0348   CO2 27 07/25/2015 1138   CO2 28 01/22/2013 0348   BUN 14 07/25/2015 1138   BUN 16 01/22/2013 0348   CREATININE 1.03 07/25/2015 1138   CREATININE 1.02 01/22/2013 0348      Component Value Date/Time   CALCIUM 9.1 07/25/2015 1138   CALCIUM 8.6 01/22/2013 0348   ALKPHOS 104 06/25/2015 1045   ALKPHOS 143* 01/21/2013 0209   AST 30 06/25/2015 1045   AST 28 01/21/2013 0209   ALT 33 06/25/2015 1045   ALT 36 01/21/2013 0209   BILITOT 0.4 06/25/2015 1045   BILITOT 0.4 01/21/2013 0209        ASSESSMENT & PLAN:   Iron deficiency anemia due to  chronic blood loss #Macrocytic anemia-likely multifactorial anemia [iron deficient-likely secondary to gastric losses versus others-question cirrhosis versus bone marrow dysfunction [see below].    #Today hemoglobin 7.2; symptomatic. Proceed with IV Feraheme infusion today.  Also proceed with proceed with 1 unit PRBC transfusion 1 to 2 days.  Also await GI evaluation this week.  #Given the macrocytosis-in the context of iron deficiency without obvious GI loss [awaiting repeat GI evaluation]-I think it is reasonable to proceed with bone marrow biopsy to rule out any bone marrow dysfunction. Discussed with the patient the bone marrow biopsy and aspiration indication and procedure at length.  Given significant discomfort involved-I would recommend under anesthesia/with radiology in the hospital. I discussed the potential complications include-bleeding/trauma and risk of infection; which are fortunately very rare.  Patient is in agreement. Patient will sign the consent prior to the procedure.  Patient will inform us of his decision after discussion with cardiology [recent A.  Fib/concerns of sedation]  #Intermittent leucopenia-today 4.2 ;thrombocytopenia-140-s150s-Today 155 Intermittent.? mild Cirrhosis [normal spleen] on June 2020 CT scan /liver disease.  Stable  #CAD [aspirin plus Plavix]; A. Fib [on Eliquis]-we will defer to cardiology regarding clearance for sedation for bone marrow biopsy.  Patient is appointment tomorrow.  Patient does not need to hold any of his anticoagulation/antiplatelet therapy prior to bone marrow biopsy.  # DISPOSITION:  # IV ferrahem today; # in 1week- IV ferrahem weekly x 2.  # 08/18- afternoon- HOLD tube; 1 unit PRBC transfusion # follow up in 3 weeks MD; labs-cbc/bmp;hold tube; possible ferrahem OR 1 unit PRBC-Dr.B

## 2020-01-09 ENCOUNTER — Encounter: Payer: Self-pay | Admitting: Cardiovascular Disease

## 2020-01-09 ENCOUNTER — Other Ambulatory Visit: Payer: Self-pay

## 2020-01-09 ENCOUNTER — Ambulatory Visit: Payer: PPO | Admitting: Cardiovascular Disease

## 2020-01-09 VITALS — BP 136/60 | HR 53 | Ht 68.0 in | Wt 182.0 lb

## 2020-01-09 DIAGNOSIS — I4891 Unspecified atrial fibrillation: Secondary | ICD-10-CM | POA: Diagnosis not present

## 2020-01-09 DIAGNOSIS — I6523 Occlusion and stenosis of bilateral carotid arteries: Secondary | ICD-10-CM

## 2020-01-09 DIAGNOSIS — E782 Mixed hyperlipidemia: Secondary | ICD-10-CM | POA: Diagnosis not present

## 2020-01-09 DIAGNOSIS — I25118 Atherosclerotic heart disease of native coronary artery with other forms of angina pectoris: Secondary | ICD-10-CM | POA: Diagnosis not present

## 2020-01-09 DIAGNOSIS — I10 Essential (primary) hypertension: Secondary | ICD-10-CM

## 2020-01-09 MED ORDER — ISOSORBIDE MONONITRATE ER 30 MG PO TB24
30.0000 mg | ORAL_TABLET | Freq: Every day | ORAL | 5 refills | Status: DC
Start: 2020-01-09 — End: 2020-04-22

## 2020-01-09 NOTE — Progress Notes (Signed)
Cardiology Office Note   Date:  01/09/2020   ID:  Seth Hardy, DOB 1945/10/05, MRN [id number]  PCP:  Seth Aus, MD  Cardiologist:   Seth Sacramento, MD   Chief Complaint  Patient presents with  . Other    Hospital Follow up - Afib / Loletha Grayer. Meds reviewed verbally with patient.       History of Present Illness: [name] is a 74 y.o. male who Is here today for a follow-up visit regarding multiple cardiac issues. He has a history of coronary artery disease and is status CABG in September 2011 with subsequent bare metal stenting of the right coronary artery in 2013. He also has a history of persistent atrial fibrillation maintained in sinus rhythm on small dose amiodarone and is chronically anticoagulated on Coumadin. He has other chronic medical conditions that include aortic stenosis, sleep apnea on CPAP hypertension, hyperlipidemia, bilateral carotid arterial disease status post left carotid endarterectomy in April 2017, and diabetes.  He has known history of iron deficiency anemia due to gastric polyp which was resected.  He was seen in November of 2019 for worsening angina.  I repeated his echocardiogram which showed normal LV systolic function.  Aortic stenosis was found to be moderate with a mean gradient of 27 mmHg. A right and left cardiac catheterization was performed in December 20 19 which showed significant underlying three-vessel coronary artery disease with patent LIMA to LAD and SVG to OM with known chronically occluded SVG to RCA.  Proximal RCA stent was patent with only mild in-stent restenosis.  However, he was found to have severe calcified stenosis of the distal RCA.  Right heart catheterization showed mildly elevated filling pressures with normal cardiac output.  Aortic stenosis was moderate with a mean gradient of 20 mmHg. The patient underwent staged orbital atherectomy of the distal right coronary artery with drug-eluting stent placement.     Echocardiogram in March of this year showed normal LV systolic function with stable moderate aortic stenosis with mean gradient of 28 mmHg with a valve area of 1.35.  The patient was hospitalized at [facility] in [place] in April of this year after he presented with A. fib with RVR and non-STEMI.    He underwent left heart catheterization which showed significant restenosis in the distal RCA stent.  This was treated with PCI and adding another drug-eluting stent.   He was hospitalized recently at [facility] with A. fib with RVR.  He converted sinus rhythm with IV amiodarone.  He was previously on amiodarone 100 mg once daily and this was increased to 200 mg twice daily for 1 week then down to 200 mg once daily.  His troponin was mildly elevated felt to be due to supply demand ischemia.  Echocardiogram was repeated which showed an EF of 60 to 17%, grade 2 diastolic dysfunction and stable moderate aortic stenosis.  The patient is noted to have iron deficiency anemia currently managed by hematology.  He underwent iron infusion yesterday and is supposed to get blood transfusion soon.   Shortly after recent hospital discharge he developed palpitations and chest pain. He called EMS and was in A. fib but converted quickly to sinus rhythm. He uses his CPAP regularly.  Yesterday, he had an episode of exertional chest pain and shortness of breath that responded to sublingual nitroglycerin. He feels better today.   Past Medical History:  Diagnosis Date  . Anemia 08/2015   received 2 units rbc  one week ago, 3 IV iron infusion -last 1 week.  . Basal cell carcinoma of skin 2012   removed several spots from arms and back  . CAD (coronary artery disease)    a. 01/2010 CABG x 4: LIMA->LAD, VG->D1, VG->OM1, VG->PDA. b. NSTEMI 02/2012 in setting of AF-RVR with cath s/p BMS to RCA (plan for 1 month, possibly up to 3 months of Plavix)  . Carotid disease, bilateral ([place])    a. 03/2011 U/S: 40-59% bilat  Carotid dzs;  b. 04/2015 Carotid U/S: 40-59% bilat ICA stenosis; c. 05/2015 CTA Neck: 41% RICA, 66% LICA, mod-marked R vertebral stenosis, mod L vertebral stenosis.  . Chronic kidney disease    small obtusion per pcp. ultrasound done on 08/29/2015  . Diabetes mellitus type II, controlled ([place])    a. Variable CBG 02/2012 - several meds adjusted.  Marland Kitchen Dysrhythmia    intermittent Atrial Fibrillation  . GERD (gastroesophageal reflux disease)   . Heart murmur   . Hyperlipidemia   . Hypertension   . Hypertensive heart disease   . Iron deficiency anemia    "gets infusions q once in awhile" (04/27/2018)  . Macular edema bil   lazer work done previously  . Mild aortic stenosis    a. 03/2011 Echo: EF 55-60%, Mild AS. b. Mild by cath 02/2012; c. 01/2014 Echo: EF 65-70%, Gr 1 DD, mild AS, mildly dil LA.  . Multiple gastric ulcers 2006  . Myocardial infarction ([place]) 02/2012   stents (x1) at time  . Myocardial infarction [facility])    "2nd one was after 02/2012; don't know date" (04/27/2018)  . Obesity   . On home oxygen therapy    "2.5 w/CPAP" (04/27/2018)  . OSA on CPAP    CPAP settings 3 with oxygen 2.5 (04/27/2018)  . PAF (paroxysmal atrial fibrillation) ([place])    a. Newly dx 02/2012 & initiated on Coumadin (spont converted to NSR).  . Shortness of breath dyspnea   . Transfusion history    transfusions -1 month ago -2 units    Past Surgical History:  Procedure Laterality Date  . ANGIOPLASTY    . BASAL CELL CARCINOMA EXCISION     "shoulder, arm X 2"  . CARDIAC CATHETERIZATION  2014  . CARDIAC CATHETERIZATION  2021   [facility] x1 stent  . CATARACT EXTRACTION W/ INTRAOCULAR LENS  IMPLANT, BILATERAL Bilateral 2016  . COLONOSCOPY WITH PROPOFOL N/A 10/15/2015   Procedure: COLONOSCOPY WITH PROPOFOL;  Surgeon: Gatha Mayer, MD;  Location: [facility] ENDOSCOPY;  Service: Endoscopy;  Laterality: N/A;  . CORONARY ANGIOPLASTY     Dr. Fletcher Anon  . CORONARY ARTERY BYPASS GRAFT  2011   CABG X3-4;  [place], [place], [place],   . CORONARY ATHERECTOMY  04/27/2018  . CORONARY ATHERECTOMY N/A 04/27/2018   Procedure: CORONARY ATHERECTOMY;  Surgeon: Belva Crome, MD;  Location: [place] CV LAB;  Service: Cardiovascular;  Laterality: N/A;  . cyst L kidney  [place]  . ENDARTERECTOMY Left 08/30/2015   Procedure: ENDARTERECTOMY CAROTID;  Surgeon: Katha Cabal, MD;  Location: [facility] ORS;  Service: Vascular;  Laterality: Left;  . ESOPHAGOGASTRODUODENOSCOPY (EGD) WITH PROPOFOL N/A 10/15/2015   Procedure: ESOPHAGOGASTRODUODENOSCOPY (EGD) WITH PROPOFOL;  Surgeon: Gatha Mayer, MD;  Location: [facility];  Service: Endoscopy;  Laterality: N/A;  . EYE SURGERY Bilateral    "has macular edema cauterized" (04/27/2018)  . INGUINAL HERNIA REPAIR Right   . LEFT HEART CATHETERIZATION WITH CORONARY ANGIOGRAM N/A 03/09/2012  Procedure: LEFT HEART CATHETERIZATION WITH CORONARY ANGIOGRAM;  Surgeon: Wellington Hampshire, MD;  Location: [place] CATH LAB;  Service: Cardiovascular;  Laterality: N/A;  . PERCUTANEOUS CORONARY STENT INTERVENTION (PCI-S)  03/09/2012   Procedure: PERCUTANEOUS CORONARY STENT INTERVENTION (PCI-S);  Surgeon: Wellington Hampshire, MD;  Location: [facility] CATH LAB;  Service: Cardiovascular;;  . RIGHT/LEFT HEART CATH AND CORONARY ANGIOGRAPHY N/A 04/25/2018   Procedure: RIGHT/LEFT HEART CATH AND CORONARY ANGIOGRAPHY;  Surgeon: Wellington Hampshire, MD;  Location: [place] CV LAB;  Service: Cardiovascular;  Laterality: N/A;     Current Outpatient Medications  Medication Sig Dispense Refill  . ALPRAZolam (XANAX) 0.25 MG tablet Take 0.25 mg by mouth daily as needed for anxiety.    Marland Kitchen amiodarone (PACERONE) 200 MG tablet Take 1 tablet (200 mg total) by mouth 2 (two) times daily for 6 days. Then take one tab po daily. 45 tablet 0  . amLODipine (NORVASC) 5 MG tablet Take one tablet by mouth one time daily 90 tablet 3  . clopidogrel (PLAVIX) 75 MG tablet TAKE 1 TABLET BY MOUTH DAILY 90 tablet 0  .  Cyanocobalamin (B-12) 1000 MCG CAPS Take 1,000 mcg by mouth every evening.     . Ferrous Gluconate-C-Folic Acid (IRON-C PO) Take 1 tablet by mouth daily.    Marland Kitchen glimepiride (AMARYL) 4 MG tablet Take 4 mg by mouth daily.     . insulin glargine (LANTUS) 100 UNIT/ML injection Inject 15 Units into the skin daily.     Marland Kitchen NOVOLOG FLEXPEN 100 UNIT/ML FlexPen Inject 6-36 Units into the skin 2 (two) times daily with a meal.     . Omega-3 Fatty Acids (FISH OIL) 1000 MG CAPS Take 1,000 mg by mouth every evening.    . OXYGEN Inhale 2.5 L/min into the lungs at bedtime. With CPAP    . pantoprazole (PROTONIX) 40 MG tablet Take 1 tablet (40 mg total) by mouth daily. 30 tablet 0  . Polyethylene Glycol 400 (BLINK TEARS OP) Place 1 drop into both eyes daily as needed (dry eyes).    . ramipril (ALTACE) 10 MG capsule Take 10 mg by mouth daily.     . rosuvastatin (CRESTOR) 40 MG tablet TAKE ONE TABLET BY MOUTH EVERY DAY 90 tablet 2  . sucralfate (CARAFATE) 1 g tablet Take 1 g by mouth 2 (two) times daily.    . vitamin C (ASCORBIC ACID) 500 MG tablet Take 500 mg by mouth every evening.    . warfarin (COUMADIN) 5 MG tablet Take 2.5-5 mg by mouth See admin instructions. Take 5 mg at night on Sun, Mon, Wed, Thur, and Fri.  Take 2.5 mg at night on Tues and Sat    . zinc gluconate 50 MG tablet Take 50 mg by mouth daily.     . tadalafil (CIALIS) 10 MG tablet Take 10 mg by mouth as needed.     No current facility-administered medications for this visit.    Allergies:   Patient has no known allergies.    Social History:  The patient  reports that he has never smoked. He has never used smokeless tobacco. He reports current alcohol use. He reports that he does not use drugs.   Family History:  [name] family history includes Bladder Cancer in his mother; COPD in his mother; Cancer in an other family member; Heart attack in his father; Kidney disease in his paternal uncle; Liver cancer in his maternal aunt; Lung cancer in  his maternal aunt.    ROS:  Please see the history of present illness.   Otherwise, review of systems are positive for none.   All other systems are reviewed and negative.    PHYSICAL EXAM: VS:  There were no vitals taken for this visit. , BMI There is no height or weight on file to calculate BMI. GEN: Well nourished, well developed, in no acute distress  HEENT: normal  Neck: no JVD, carotid bruits, or masses Cardiac: RRR; no rubs, or gallops, mild bilateral leg edema . There is a 3/6 crescendo decrescendo systolic murmur in the aortic area which is mid peaking.  S2 is diminished.  The murmur radiates to the carotid arteries. Respiratory:  clear to auscultation bilaterally, normal work of breathing GI: soft, nontender, nondistended, + BS MS: no deformity or atrophy  Skin: warm and dry, no rash Neuro:  Strength and sensation are intact Psych: euthymic mood, full affect    EKG:  EKG  ordered today. EKG showed sinus bradycardia with a heart rate of 53 bpm. Anterior and inferior T wave changes suggestive of ischemia.   Recent Labs: 12/26/2019: TSH 3.237 12/28/2019: ALT 20; Magnesium 2.1 01/08/2020: BUN 25; Creatinine, Ser 1.59; Hemoglobin 7.2; Platelets 233; Potassium 4.8; Sodium 135    Lipid Panel    Component Value Date/Time   CHOL 105 12/26/2019 0813   TRIG 123 12/26/2019 0813   HDL 36 (L) 12/26/2019 0813   CHOLHDL 2.9 12/26/2019 0813   VLDL 25 12/26/2019 0813   LDLCALC 44 12/26/2019 0813      Wt Readings from Last 3 Encounters:  01/08/20 183 lb (83 kg)  12/27/19 182 lb 3.2 oz (82.6 kg)  11/10/19 184 lb 6.4 oz (83.6 kg)        ASSESSMENT AND PLAN:   1.  Coronary artery disease involving bypass graft with other forms of angina: Recent worsening of anginal symptoms in the setting of episodes of A. fib with RVR. I also think underlying anemia is contributing to this. I favor trying to maximize his medical therapy for now before considering repeat cardiac catheterization.  I added Imdur 30 mg once daily. Continue clopidogrel without aspirin given that he is on warfarin to minimize the risk of bleeding especially with underlying anemia.  2. Bilateral carotid artery stenosis: Status post  left carotid endarterectomy. Followed by Dr. Delana Meyer.  3. moderate aortic stenosis: This was stable on most recent echocardiogram earlier this month. I do think underlying anemia is significantly contributing to worsening symptoms of angina in the setting of CAD and underlying aortic stenosis.  4. Persistent atrial fibrillation: He had recent episodes of A. fib with RVR in spite of treatment with amiodarone. The issue of underlying bradycardia when he is in sinus rhythm also complicates the addition of other medications. He has an appointment with Dr. Miki Kins tomorrow to consider either atrial fibrillation ablation or placement of a permanent pacemaker in order to be able to be more aggressive with A. fib treatment.   5. Hyperlipidemia: Continue high-dose rosuvastatin.  His LDL has been below 70.  6. Anemia: Most recent hemoglobin was 7.2 yesterday. He is being followed by hematology and was given iron infusion with plans for transfusion later this week. It was recommended that he undergoes a bone marrow biopsy and GI evaluation. Given stent placement in April, it might be wise to wait few months before interrupting his clopidogrel for procedures.    Disposition:   Follow-up in 1 month   Signed,  Seth Sacramento, MD  01/09/2020 4:25 PM  [facility]

## 2020-01-09 NOTE — Patient Instructions (Addendum)
Medication Instructions:  Your physician has recommended you make the following change in your medication:   START Imdur 30 mg daily. An Rx has been sent to your pharmacy.   Do Not take Cialis until further instructed.   *If you need a refill on your cardiac medications before your next appointment, please call your pharmacy*   Lab Work: None ordered  If you have labs (blood work) drawn today and your tests are completely normal, you will receive your results only by: Marland Kitchen MyChart Message (if you have MyChart) OR . A paper copy in the mail If you have any lab test that is abnormal or we need to change your treatment, we will call you to review the results.   Testing/Procedures: None ordered   Follow-Up: At [facility], you and your health needs are our priority.  As part of our continuing mission to provide you with exceptional heart care, we have created designated Provider Care Teams.  These Care Teams include your primary Cardiologist (physician) and Advanced Practice Providers (APPs -  Physician Assistants and Nurse Practitioners) who all work together to provide you with the care you need, when you need it.  We recommend signing up for the patient portal called "MyChart".  Sign up information is provided on this After Visit Summary.  MyChart is used to connect with patients for Virtual Visits (Telemedicine).  Patients are able to view lab/test results, encounter notes, upcoming appointments, etc.  Non-urgent messages can be sent to your provider as well.   To learn more about what you can do with MyChart, go to [email or web address].    Your next appointment:   1 month(s)  The format for your next appointment:   In Person  Provider:    You may see Kathlyn Sacramento, MD or one of the following Advanced Practice Providers on your designated Care Team:    Murray Hodgkins, NP  Christell Faith, PA-C  Marrianne Mood, PA-C    Other Instructions N/A

## 2020-01-10 ENCOUNTER — Ambulatory Visit: Payer: PPO | Admitting: Cardiology

## 2020-01-10 ENCOUNTER — Ambulatory Visit (INDEPENDENT_AMBULATORY_CARE_PROVIDER_SITE_OTHER): Payer: PPO

## 2020-01-10 ENCOUNTER — Encounter: Payer: Self-pay | Admitting: Cardiology

## 2020-01-10 ENCOUNTER — Telehealth: Payer: Self-pay | Admitting: *Deleted

## 2020-01-10 VITALS — BP 132/70 | HR 51 | Ht 68.0 in | Wt 182.0 lb

## 2020-01-10 DIAGNOSIS — I4891 Unspecified atrial fibrillation: Secondary | ICD-10-CM | POA: Diagnosis not present

## 2020-01-10 DIAGNOSIS — D649 Anemia, unspecified: Secondary | ICD-10-CM

## 2020-01-10 DIAGNOSIS — I4819 Other persistent atrial fibrillation: Secondary | ICD-10-CM | POA: Diagnosis not present

## 2020-01-10 DIAGNOSIS — Z7901 Long term (current) use of anticoagulants: Secondary | ICD-10-CM | POA: Diagnosis not present

## 2020-01-10 DIAGNOSIS — E1139 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: Secondary | ICD-10-CM

## 2020-01-10 DIAGNOSIS — R001 Bradycardia, unspecified: Secondary | ICD-10-CM

## 2020-01-10 LAB — POCT INR: INR: 5.3 — AB (ref 2.0–3.0)

## 2020-01-10 NOTE — Patient Instructions (Addendum)
Medication Instructions:  Your physician recommends that you continue on your current medications as directed. Please refer to the Current Medication list given to you today.  Labwork: None ordered.  Testing/Procedures: Your physician has recommended that you have a pacemaker inserted. A pacemaker is a small device that is placed under the skin of your chest or abdomen to help control abnormal heart rhythms. This device uses electrical pulses to prompt the heart to beat at a normal rate. Pacemakers are used to treat heart rhythms that are too slow. Wire (leads) are attached to the pacemaker that goes into the chambers of you heart. This is done in the hospital and usually requires and overnight stay. Please see the instruction sheet given to you today for more information.  Follow-Up:  SEE INSTRUCTION LETTER  Any Other Special Instructions Will Be Listed Below (If Applicable).  If you need a refill on your cardiac medications before your next appointment, please call your pharmacy.    Pacemaker Implantation, Adult Pacemaker implantation is a procedure to place a pacemaker inside your chest. A pacemaker is a small computer that sends electrical signals to the heart and helps your heart beat normally. A pacemaker also stores information about your heart rhythms. You may need pacemaker implantation if you:  Have a slow heartbeat (bradycardia).  Faint (syncope).  Have shortness of breath (dyspnea) due to heart problems. The pacemaker attaches to your heart through a wire, called a lead. Sometimes just one lead is needed. Other times, there will be two leads. There are two types of pacemakers:  Transvenous pacemaker. This type is placed under the skin or muscle of your chest. The lead goes through a vein in the chest area to reach the inside of the heart.  Epicardial pacemaker. This type is placed under the skin or muscle of your chest or belly. The lead goes through your chest to the  outside of the heart. Tell a health care provider about:  Any allergies you have.  All medicines you are taking, including vitamins, herbs, eye drops, creams, and over-the-counter medicines.  Any problems you or family members have had with anesthetic medicines.  Any blood or bone disorders you have.  Any surgeries you have had.  Any medical conditions you have.  Whether you are pregnant or may be pregnant. What are the risks? Generally, this is a safe procedure. However, problems may occur, including:  Infection.  Bleeding.  Failure of the pacemaker or the lead.  Collapse of a lung or bleeding into a lung.  Blood clot inside a blood vessel with a lead.  Damage to the heart.  Infection inside the heart (endocarditis).  Allergic reactions to medicines. What happens before the procedure? Staying hydrated Follow instructions from your health care provider about hydration, which may include:  Up to 2 hours before the procedure - you may continue to drink clear liquids, such as water, clear fruit juice, black coffee, and plain tea. Eating and drinking restrictions Follow instructions from your health care provider about eating and drinking, which may include:  8 hours before the procedure - stop eating heavy meals or foods such as meat, fried foods, or fatty foods.  6 hours before the procedure - stop eating light meals or foods, such as toast or cereal.  6 hours before the procedure - stop drinking milk or drinks that contain milk.  2 hours before the procedure - stop drinking clear liquids. Medicines  Ask your health care provider about: ? Changing or  stopping your regular medicines. This is especially important if you are taking diabetes medicines or blood thinners. ? Taking medicines such as aspirin and ibuprofen. These medicines can thin your blood. Do not take these medicines before your procedure if your health care provider instructs you not to.  You may be  given antibiotic medicine to help prevent infection. General instructions  You will have a heart evaluation. This may include an electrocardiogram (ECG), chest X-ray, and heart imaging (echocardiogram,  or echo) tests.  You will have blood tests.  Do not use any products that contain nicotine or tobacco, such as cigarettes and e-cigarettes. If you need help quitting, ask your health care provider.  Plan to have someone take you home from the hospital or clinic.  If you will be going home right after the procedure, plan to have someone with you for 24 hours.  Ask your health care provider how your surgical site will be marked or identified. What happens during the procedure?  To reduce your risk of infection: ? Your health care team will wash or sanitize their hands. ? Your skin will be washed with soap. ? Hair may be removed from the surgical area.  An IV tube will be inserted into one of your veins.  You will be given one or more of the following: ? A medicine to help you relax (sedative). ? A medicine to numb the area (local anesthetic). ? A medicine to make you fall asleep (general anesthetic).  If you are getting a transvenous pacemaker: ? An incision will be made in your upper chest. ? A pocket will be made for the pacemaker. It may be placed under the skin or between layers of muscle. ? The lead will be inserted into a blood vessel that returns to the heart. ? While X-rays are taken by an imaging machine (fluoroscopy), the lead will be advanced through the vein to the inside of your heart. ? The other end of the lead will be tunneled under the skin and attached to the pacemaker.  If you are getting an epicardial pacemaker: ? An incision will be made near your ribs or breastbone (sternum) for the lead. ? The lead will be attached to the outside of your heart. ? Another incision will be made in your chest or upper belly to create a pocket for the pacemaker. ? The free end  of the lead will be tunneled under the skin and attached to the pacemaker.  The transvenous or epicardial pacemaker will be tested. Imaging studies may be done to check the lead position.  The incisions will be closed with stitches (sutures), adhesive strips, or skin glue.  Bandages (dressing) will be placed over the incisions. The procedure may vary among health care providers and hospitals. What happens after the procedure?  Your blood pressure, heart rate, breathing rate, and blood oxygen level will be monitored until the medicines you were given have worn off.  You will be given antibiotics and pain medicine.  ECG and chest x-rays will be done.  You will wear a continuous type of ECG (Holter monitor) to check your heart rhythm.  Your health care provider will program the pacemaker.  Do not drive for 24 hours if you received a sedative. This information is not intended to replace advice given to you by your health care provider. Make sure you discuss any questions you have with your health care provider. Document Revised: 01/28/2018 Document Reviewed: 10/23/2015 Elsevier Patient Education  2020 [employer]  Inc.  

## 2020-01-10 NOTE — Telephone Encounter (Signed)
Patient asking if Dr B and Dr Peterson Lombard have talked and if he is being scheduled for [facility]

## 2020-01-10 NOTE — Patient Instructions (Signed)
-   skip warfarin today and tomorrow - on Friday, START NEW DOSAGE of warfarin 1/2 tablet every day - Recheck INR in 2 weeks

## 2020-01-11 ENCOUNTER — Encounter (HOSPITAL_COMMUNITY): Payer: Self-pay | Admitting: Student

## 2020-01-11 ENCOUNTER — Encounter: Payer: Self-pay | Admitting: Physician Assistant

## 2020-01-11 ENCOUNTER — Inpatient Hospital Stay: Payer: PPO

## 2020-01-11 ENCOUNTER — Other Ambulatory Visit: Payer: Self-pay

## 2020-01-11 ENCOUNTER — Ambulatory Visit: Payer: PPO | Admitting: Physician Assistant

## 2020-01-11 ENCOUNTER — Telehealth: Payer: Self-pay | Admitting: Cardiovascular Disease

## 2020-01-11 ENCOUNTER — Encounter (HOSPITAL_COMMUNITY): Payer: Self-pay

## 2020-01-11 ENCOUNTER — Telehealth: Payer: Self-pay | Admitting: *Deleted

## 2020-01-11 ENCOUNTER — Other Ambulatory Visit (INDEPENDENT_AMBULATORY_CARE_PROVIDER_SITE_OTHER): Payer: PPO

## 2020-01-11 ENCOUNTER — Inpatient Hospital Stay (HOSPITAL_COMMUNITY)
Admission: EM | Admit: 2020-01-11 | Discharge: 2020-01-16 | DRG: 378 | Disposition: A | Discharge status: Home / Self Care | Payer: PPO | Attending: Internal Medicine | Admitting: Internal Medicine

## 2020-01-11 VITALS — BP 140/62 | HR 54 | Ht 68.0 in | Wt 180.0 lb

## 2020-01-11 DIAGNOSIS — G4733 Obstructive sleep apnea (adult) (pediatric): Secondary | ICD-10-CM | POA: Diagnosis not present

## 2020-01-11 DIAGNOSIS — E785 Hyperlipidemia, unspecified: Secondary | ICD-10-CM | POA: Diagnosis present

## 2020-01-11 DIAGNOSIS — E1122 Type 2 diabetes mellitus with diabetic chronic kidney disease: Secondary | ICD-10-CM | POA: Diagnosis present

## 2020-01-11 DIAGNOSIS — K219 Gastro-esophageal reflux disease without esophagitis: Secondary | ICD-10-CM | POA: Diagnosis not present

## 2020-01-11 DIAGNOSIS — K922 Gastrointestinal hemorrhage, unspecified: Secondary | ICD-10-CM | POA: Diagnosis present

## 2020-01-11 DIAGNOSIS — K31819 Angiodysplasia of stomach and duodenum without bleeding: Secondary | ICD-10-CM

## 2020-01-11 DIAGNOSIS — Z7901 Long term (current) use of anticoagulants: Secondary | ICD-10-CM | POA: Diagnosis not present

## 2020-01-11 DIAGNOSIS — K921 Melena: Secondary | ICD-10-CM

## 2020-01-11 DIAGNOSIS — Z951 Presence of aortocoronary bypass graft: Secondary | ICD-10-CM | POA: Diagnosis not present

## 2020-01-11 DIAGNOSIS — R079 Chest pain, unspecified: Secondary | ICD-10-CM | POA: Diagnosis not present

## 2020-01-11 DIAGNOSIS — D72819 Decreased white blood cell count, unspecified: Secondary | ICD-10-CM | POA: Diagnosis not present

## 2020-01-11 DIAGNOSIS — Z9842 Cataract extraction status, left eye: Secondary | ICD-10-CM

## 2020-01-11 DIAGNOSIS — X58XXXA Exposure to other specified factors, initial encounter: Secondary | ICD-10-CM | POA: Diagnosis present

## 2020-01-11 DIAGNOSIS — I350 Nonrheumatic aortic (valve) stenosis: Secondary | ICD-10-CM

## 2020-01-11 DIAGNOSIS — I4819 Other persistent atrial fibrillation: Secondary | ICD-10-CM | POA: Diagnosis not present

## 2020-01-11 DIAGNOSIS — D62 Acute posthemorrhagic anemia: Secondary | ICD-10-CM | POA: Diagnosis not present

## 2020-01-11 DIAGNOSIS — Z85828 Personal history of other malignant neoplasm of skin: Secondary | ICD-10-CM

## 2020-01-11 DIAGNOSIS — I214 Non-ST elevation (NSTEMI) myocardial infarction: Secondary | ICD-10-CM | POA: Diagnosis not present

## 2020-01-11 DIAGNOSIS — R195 Other fecal abnormalities: Secondary | ICD-10-CM | POA: Diagnosis not present

## 2020-01-11 DIAGNOSIS — D649 Anemia, unspecified: Secondary | ICD-10-CM | POA: Diagnosis not present

## 2020-01-11 DIAGNOSIS — D500 Iron deficiency anemia secondary to blood loss (chronic): Secondary | ICD-10-CM | POA: Diagnosis not present

## 2020-01-11 DIAGNOSIS — K31811 Angiodysplasia of stomach and duodenum with bleeding: Secondary | ICD-10-CM | POA: Diagnosis not present

## 2020-01-11 DIAGNOSIS — Z7902 Long term (current) use of antithrombotics/antiplatelets: Secondary | ICD-10-CM | POA: Diagnosis not present

## 2020-01-11 DIAGNOSIS — I1310 Hypertensive heart and chronic kidney disease without heart failure, with stage 1 through stage 4 chronic kidney disease, or unspecified chronic kidney disease: Secondary | ICD-10-CM | POA: Diagnosis not present

## 2020-01-11 DIAGNOSIS — Z801 Family history of malignant neoplasm of trachea, bronchus and lung: Secondary | ICD-10-CM

## 2020-01-11 DIAGNOSIS — Z955 Presence of coronary angioplasty implant and graft: Secondary | ICD-10-CM | POA: Diagnosis not present

## 2020-01-11 DIAGNOSIS — Z9841 Cataract extraction status, right eye: Secondary | ICD-10-CM

## 2020-01-11 DIAGNOSIS — I480 Paroxysmal atrial fibrillation: Secondary | ICD-10-CM | POA: Diagnosis not present

## 2020-01-11 DIAGNOSIS — I252 Old myocardial infarction: Secondary | ICD-10-CM | POA: Diagnosis not present

## 2020-01-11 DIAGNOSIS — Z20822 Contact with and (suspected) exposure to covid-19: Secondary | ICD-10-CM | POA: Diagnosis not present

## 2020-01-11 DIAGNOSIS — E1165 Type 2 diabetes mellitus with hyperglycemia: Secondary | ICD-10-CM | POA: Diagnosis present

## 2020-01-11 DIAGNOSIS — I2510 Atherosclerotic heart disease of native coronary artery without angina pectoris: Secondary | ICD-10-CM | POA: Diagnosis present

## 2020-01-11 DIAGNOSIS — Z9981 Dependence on supplemental oxygen: Secondary | ICD-10-CM | POA: Diagnosis not present

## 2020-01-11 DIAGNOSIS — Z8249 Family history of ischemic heart disease and other diseases of the circulatory system: Secondary | ICD-10-CM

## 2020-01-11 DIAGNOSIS — Z794 Long term (current) use of insulin: Secondary | ICD-10-CM

## 2020-01-11 DIAGNOSIS — I4891 Unspecified atrial fibrillation: Secondary | ICD-10-CM | POA: Diagnosis not present

## 2020-01-11 DIAGNOSIS — R001 Bradycardia, unspecified: Secondary | ICD-10-CM | POA: Diagnosis present

## 2020-01-11 DIAGNOSIS — Z800 Family history of malignant neoplasm of digestive organs: Secondary | ICD-10-CM

## 2020-01-11 DIAGNOSIS — N183 Chronic kidney disease, stage 3 unspecified: Secondary | ICD-10-CM | POA: Diagnosis not present

## 2020-01-11 DIAGNOSIS — K317 Polyp of stomach and duodenum: Secondary | ICD-10-CM | POA: Diagnosis not present

## 2020-01-11 DIAGNOSIS — Z8052 Family history of malignant neoplasm of bladder: Secondary | ICD-10-CM

## 2020-01-11 DIAGNOSIS — I10 Essential (primary) hypertension: Secondary | ICD-10-CM | POA: Diagnosis not present

## 2020-01-11 DIAGNOSIS — Z961 Presence of intraocular lens: Secondary | ICD-10-CM | POA: Diagnosis present

## 2020-01-11 DIAGNOSIS — D509 Iron deficiency anemia, unspecified: Secondary | ICD-10-CM | POA: Diagnosis not present

## 2020-01-11 DIAGNOSIS — D6832 Hemorrhagic disorder due to extrinsic circulating anticoagulants: Secondary | ICD-10-CM | POA: Diagnosis not present

## 2020-01-11 DIAGNOSIS — Z8711 Personal history of peptic ulcer disease: Secondary | ICD-10-CM

## 2020-01-11 DIAGNOSIS — E782 Mixed hyperlipidemia: Secondary | ICD-10-CM | POA: Diagnosis not present

## 2020-01-11 DIAGNOSIS — E119 Type 2 diabetes mellitus without complications: Secondary | ICD-10-CM

## 2020-01-11 DIAGNOSIS — I6523 Occlusion and stenosis of bilateral carotid arteries: Secondary | ICD-10-CM | POA: Diagnosis not present

## 2020-01-11 DIAGNOSIS — Z79899 Other long term (current) drug therapy: Secondary | ICD-10-CM

## 2020-01-11 DIAGNOSIS — I25118 Atherosclerotic heart disease of native coronary artery with other forms of angina pectoris: Secondary | ICD-10-CM | POA: Diagnosis present

## 2020-01-11 DIAGNOSIS — K5900 Constipation, unspecified: Secondary | ICD-10-CM | POA: Diagnosis present

## 2020-01-11 DIAGNOSIS — T45515A Adverse effect of anticoagulants, initial encounter: Secondary | ICD-10-CM | POA: Diagnosis present

## 2020-01-11 DIAGNOSIS — F419 Anxiety disorder, unspecified: Secondary | ICD-10-CM | POA: Diagnosis present

## 2020-01-11 DIAGNOSIS — Z825 Family history of asthma and other chronic lower respiratory diseases: Secondary | ICD-10-CM

## 2020-01-11 LAB — CBC
HCT: 21.4 % — ABNORMAL LOW (ref 39.0–52.0)
Hemoglobin: 6.2 g/dL — CL (ref 13.0–17.0)
MCH: 33.3 pg (ref 26.0–34.0)
MCHC: 29 g/dL — ABNORMAL LOW (ref 30.0–36.0)
MCV: 115.1 fL — ABNORMAL HIGH (ref 80.0–100.0)
Platelets: 219 10*3/uL (ref 150–400)
RBC: 1.86 MIL/uL — ABNORMAL LOW (ref 4.22–5.81)
RDW: 18 % — ABNORMAL HIGH (ref 11.5–15.5)
WBC: 4.8 10*3/uL (ref 4.0–10.5)
nRBC: 0 % (ref 0.0–0.2)

## 2020-01-11 LAB — CBC WITH DIFFERENTIAL/PLATELET
Basophils Absolute: 0 10*3/uL (ref 0.0–0.1)
Basophils Relative: 0.7 % (ref 0.0–3.0)
Eosinophils Absolute: 0.1 10*3/uL (ref 0.0–0.7)
Eosinophils Relative: 1.8 % (ref 0.0–5.0)
HCT: 19.9 % — CL (ref 39.0–52.0)
Hemoglobin: 6.5 g/dL — CL (ref 13.0–17.0)
Lymphocytes Relative: 13.9 % (ref 12.0–46.0)
Lymphs Abs: 0.7 10*3/uL (ref 0.7–4.0)
MCHC: 32.7 g/dL (ref 30.0–36.0)
MCV: 104.9 fl — ABNORMAL HIGH (ref 78.0–100.0)
Monocytes Absolute: 0.6 10*3/uL (ref 0.1–1.0)
Monocytes Relative: 12.9 % — ABNORMAL HIGH (ref 3.0–12.0)
Neutro Abs: 3.5 10*3/uL (ref 1.4–7.7)
Neutrophils Relative %: 70.7 % (ref 43.0–77.0)
Platelets: 216 10*3/uL (ref 150.0–400.0)
RBC: 1.89 Mil/uL — ABNORMAL LOW (ref 4.22–5.81)
RDW: 17.9 % — ABNORMAL HIGH (ref 11.5–15.5)
WBC: 5 10*3/uL (ref 4.0–10.5)

## 2020-01-11 LAB — TROPONIN I (HIGH SENSITIVITY): Troponin I (High Sensitivity): 13 ng/L (ref <18)

## 2020-01-11 LAB — COMPREHENSIVE METABOLIC PANEL
ALT: 27 U/L (ref 0–44)
AST: 22 U/L (ref 15–41)
Albumin: 3.1 g/dL — ABNORMAL LOW (ref 3.5–5.0)
Alkaline Phosphatase: 58 U/L (ref 38–126)
Anion gap: 9 (ref 5–15)
BUN: 16 mg/dL (ref 8–23)
CO2: 23 mmol/L (ref 22–32)
Calcium: 8.5 mg/dL — ABNORMAL LOW (ref 8.9–10.3)
Chloride: 103 mmol/L (ref 98–111)
Creatinine, Ser: 1.49 mg/dL — ABNORMAL HIGH (ref 0.61–1.24)
GFR calc Af Amer: 53 mL/min — ABNORMAL LOW (ref >60)
GFR calc non Af Amer: 46 mL/min — ABNORMAL LOW (ref >60)
Glucose, Bld: 219 mg/dL — ABNORMAL HIGH (ref 70–99)
Potassium: 4.4 mmol/L (ref 3.5–5.1)
Sodium: 135 mmol/L (ref 135–145)
Total Bilirubin: 0.5 mg/dL (ref 0.3–1.2)
Total Protein: 5.3 g/dL — ABNORMAL LOW (ref 6.5–8.1)

## 2020-01-11 LAB — GLUCOSE, CAPILLARY: Glucose-Capillary: 246 mg/dL — ABNORMAL HIGH (ref 70–99)

## 2020-01-11 LAB — SARS CORONAVIRUS 2 BY RT PCR (HOSPITAL ORDER, PERFORMED IN ~~LOC~~ HOSPITAL LAB): SARS Coronavirus 2: NEGATIVE

## 2020-01-11 LAB — PREPARE RBC (CROSSMATCH)

## 2020-01-11 LAB — PROTIME-INR
INR: 2.6 — ABNORMAL HIGH (ref 0.8–1.2)
Prothrombin Time: 27.3 seconds — ABNORMAL HIGH (ref 11.4–15.2)

## 2020-01-11 MED ORDER — SODIUM CHLORIDE 0.9 % IV SOLN: 250.0000 mL | INTRAVENOUS | Status: DC | PRN

## 2020-01-11 MED ORDER — ONDANSETRON HCL 4 MG/2ML IJ SOLN: 4.0000 mg | Freq: Four times a day (QID) | INTRAMUSCULAR | Status: DC | PRN

## 2020-01-11 MED ORDER — SODIUM CHLORIDE 0.9 % IV SOLN
80.0000 mg | Freq: Once | INTRAVENOUS | Status: AC
  Administered 2020-01-11: 80 mg via INTRAVENOUS
  Filled 2020-01-11: qty 80

## 2020-01-11 MED ORDER — SODIUM CHLORIDE 0.9% FLUSH
3.0000 mL | Freq: Two times a day (BID) | INTRAVENOUS | Status: DC
  Administered 2020-01-11 – 2020-01-15 (×7): 3 mL via INTRAVENOUS

## 2020-01-11 MED ORDER — SODIUM CHLORIDE 0.9% FLUSH: 3.0000 mL | INTRAVENOUS | Status: DC | PRN

## 2020-01-11 MED ORDER — INSULIN ASPART 100 UNIT/ML ~~LOC~~ SOLN
0.0000 [IU] | Freq: Three times a day (TID) | SUBCUTANEOUS | Status: DC
  Administered 2020-01-12: 2 [IU] via SUBCUTANEOUS
  Administered 2020-01-12 – 2020-01-14 (×4): 5 [IU] via SUBCUTANEOUS
  Administered 2020-01-14: 3 [IU] via SUBCUTANEOUS
  Administered 2020-01-15: 5 [IU] via SUBCUTANEOUS
  Administered 2020-01-15 (×2): 2 [IU] via SUBCUTANEOUS
  Administered 2020-01-16: 1 [IU] via SUBCUTANEOUS
  Administered 2020-01-16: 5 [IU] via SUBCUTANEOUS

## 2020-01-11 MED ORDER — AMIODARONE HCL 200 MG PO TABS
200.0000 mg | ORAL_TABLET | Freq: Every evening | ORAL | Status: DC
  Administered 2020-01-11 – 2020-01-15 (×5): 200 mg via ORAL
  Filled 2020-01-11 (×5): qty 1

## 2020-01-11 MED ORDER — SODIUM CHLORIDE 0.9% IV SOLUTION: Freq: Once | INTRAVENOUS | Status: AC

## 2020-01-11 MED ORDER — SODIUM CHLORIDE 0.9% FLUSH
3.0000 mL | Freq: Two times a day (BID) | INTRAVENOUS | Status: DC
  Administered 2020-01-11 – 2020-01-15 (×7): 3 mL via INTRAVENOUS

## 2020-01-11 MED ORDER — SODIUM CHLORIDE 0.9 % IV SOLN
10.0000 mL/h | Freq: Once | INTRAVENOUS | Status: AC
  Administered 2020-01-11: 10 mL/h via INTRAVENOUS

## 2020-01-11 MED ORDER — SUCRALFATE 1 G PO TABS
1.0000 g | ORAL_TABLET | Freq: Two times a day (BID) | ORAL | Status: DC
  Administered 2020-01-11 – 2020-01-14 (×6): 1 g via ORAL
  Filled 2020-01-11 (×6): qty 1

## 2020-01-11 MED ORDER — POLYVINYL ALCOHOL 1.4 % OP SOLN
1.0000 [drp] | Freq: Every day | OPHTHALMIC | Status: DC | PRN
  Filled 2020-01-11: qty 15

## 2020-01-11 MED ORDER — ALPRAZOLAM 0.25 MG PO TABS
0.2500 mg | ORAL_TABLET | Freq: Every day | ORAL | Status: DC | PRN
  Administered 2020-01-13 – 2020-01-15 (×3): 0.25 mg via ORAL
  Filled 2020-01-11 (×3): qty 1

## 2020-01-11 MED ORDER — POLYETHYLENE GLYCOL 400 0.25 % OP SOLN: Freq: Every day | OPHTHALMIC | Status: DC | PRN

## 2020-01-11 MED ORDER — FISH OIL 1000 MG PO CAPS: 1000.0000 mg | ORAL_CAPSULE | Freq: Every evening | ORAL | Status: DC

## 2020-01-11 MED ORDER — ISOSORBIDE MONONITRATE ER 30 MG PO TB24
30.0000 mg | ORAL_TABLET | Freq: Every day | ORAL | Status: DC
  Administered 2020-01-12 – 2020-01-16 (×5): 30 mg via ORAL
  Filled 2020-01-11 (×5): qty 1

## 2020-01-11 MED ORDER — ASCORBIC ACID 500 MG PO TABS
500.0000 mg | ORAL_TABLET | Freq: Every evening | ORAL | Status: DC
  Administered 2020-01-11 – 2020-01-15 (×5): 500 mg via ORAL
  Filled 2020-01-11 (×5): qty 1

## 2020-01-11 MED ORDER — ACETAMINOPHEN 650 MG RE SUPP: 650.0000 mg | Freq: Four times a day (QID) | RECTAL | Status: DC | PRN

## 2020-01-11 MED ORDER — SODIUM CHLORIDE 0.9 % IV SOLN
8.0000 mg/h | INTRAVENOUS | Status: DC
  Administered 2020-01-11 – 2020-01-12 (×2): 8 mg/h via INTRAVENOUS
  Filled 2020-01-11 (×5): qty 80

## 2020-01-11 MED ORDER — INSULIN GLARGINE 100 UNIT/ML ~~LOC~~ SOLN
10.0000 [IU] | Freq: Every day | SUBCUTANEOUS | Status: DC
  Administered 2020-01-12: 10 [IU] via SUBCUTANEOUS
  Filled 2020-01-11: qty 0.1

## 2020-01-11 MED ORDER — NITROGLYCERIN 0.4 MG SL SUBL: 0.4000 mg | SUBLINGUAL_TABLET | SUBLINGUAL | Status: DC | PRN

## 2020-01-11 MED ORDER — ROSUVASTATIN CALCIUM 20 MG PO TABS
40.0000 mg | ORAL_TABLET | Freq: Every day | ORAL | Status: DC
  Administered 2020-01-11 – 2020-01-15 (×5): 40 mg via ORAL
  Filled 2020-01-11 (×5): qty 2

## 2020-01-11 MED ORDER — ALBUTEROL SULFATE (2.5 MG/3ML) 0.083% IN NEBU: 2.5000 mg | INHALATION_SOLUTION | RESPIRATORY_TRACT | Status: DC | PRN

## 2020-01-11 MED ORDER — OMEGA-3-ACID ETHYL ESTERS 1 G PO CAPS
1.0000 g | ORAL_CAPSULE | Freq: Every day | ORAL | Status: DC
  Administered 2020-01-11 – 2020-01-15 (×5): 1 g via ORAL
  Filled 2020-01-11 (×5): qty 1

## 2020-01-11 MED ORDER — ONDANSETRON HCL 4 MG PO TABS: 4.0000 mg | ORAL_TABLET | Freq: Four times a day (QID) | ORAL | Status: DC | PRN

## 2020-01-11 MED ORDER — ACETAMINOPHEN 325 MG PO TABS: 650.0000 mg | ORAL_TABLET | Freq: Four times a day (QID) | ORAL | Status: DC | PRN

## 2020-01-11 NOTE — H&P (Signed)
HISTORY AND PHYSICAL       PATIENT DETAILS Name: Seth Hardy Age: 74 y.o. Sex: male Date of Birth: 09-07-1945 Admit Date: 01/11/2020 LOV:FIEPPI, Christean Grief, MD   Patient coming from: Home   CHIEF COMPLAINT:  Referred from GI office for evaluation of anemia-ongoing melena.  HPI: Seth Hardy is a 74 y.o. male with medical history significant of CAD s/p CABG in 2011-PCI in 2013, and in April 2021, PAF on Coumadin, moderate aortic stenosis, chronic anemia secondary to chronic GI bleeding-managed with IV iron in the outpatient setting-presented to the hospital for evaluation of the above noted complaints.  Per patient-he has had longstanding issues with anemia-and has been followed by hematology and been given IV iron.  It is thought that he has had a slow/chronic GI bleed.  Plans were for bone marrow biopsy at some point.  He has had a negative EGD/colonoscopy in 2017.  Per patient-he always has had melena/dark appearing stools for the past few years-but lately the frequency of these episodes have increased-he now has around 2-3 melena appearing BMs on a daily basis for the past few weeks.  Patient also acknowledges intermittent chest pain-both at rest and with exertion.  He claims that his heart rate is mostly in the 50s-and when his heart rate goes to the 70s he does get chest discomfort.  He was seen at gastroenterology office today-where he was found to have a hemoglobin of 6.5-stools were dark and heme positive-he was subsequently referred to the emergency room.  ED Course:  Started on PPI-2 units of PRBC ordered-referred to the hospitalist service for admission.  Note: Lives at: Home Mobility:  Independent Chronic Indwelling Foley:no   REVIEW OF SYSTEMS:  Constitutional:   No  weight loss, night sweats,  Fevers, chills, fatigue.  HEENT:    No headaches, Dysphagia,Tooth/dental problems,Sore throat,  No sneezing, itching, ear ache, nasal congestion, post  nasal drip  Cardio-vascular: No Orthopnea, PND,lower extremity edema, anasarca, palpitations  GI:  No heartburn, indigestion, abdominal pain, nausea, vomiting, diarrhea  Resp: No  cough, hemoptysis,plueritic chest pain.   Skin:  No rash or lesions.  GU:  No dysuria, change in color of urine, no urgency or frequency.  No flank pain.  Musculoskeletal: No joint pain or swelling.  No decreased range of motion.  No back pain.  Endocrine: No heat intolerance, no cold intolerance, no polyuria, no polydipsia  Psych: No change in mood or affect. No depression or anxiety.  No memory loss.   ALLERGIES:  No Known Allergies  PAST MEDICAL HISTORY: Past Medical History:  Diagnosis Date  . Anemia 08/2015   received 2 units rbc one week ago, 3 IV iron infusion -last 1 week.  . Basal cell carcinoma of skin 2012   removed several spots from arms and back  . CAD (coronary artery disease)    a. 01/2010 CABG x 4: LIMA->LAD, VG->D1, VG->OM1, VG->PDA. b. NSTEMI 02/2012 in setting of AF-RVR with cath s/p BMS to RCA (plan for 1 month, possibly up to 3 months of Plavix)  . Carotid disease, bilateral ([place])    a. 03/2011 U/S: 40-59% bilat Carotid dzs;  b. 04/2015 Carotid U/S: 40-59% bilat ICA stenosis; c. 05/2015 CTA Neck: 95% RICA, 18% LICA, mod-marked R vertebral stenosis, mod L vertebral stenosis.  . Chronic kidney disease    small obtusion per pcp. ultrasound done on 08/29/2015  . Diabetes mellitus type II, controlled ([place])    a.  Variable CBG 02/2012 - several meds adjusted.  Marland Kitchen Dysrhythmia    intermittent Atrial Fibrillation  . GERD (gastroesophageal reflux disease)   . Heart murmur   . Hyperlipidemia   . Hypertension   . Hypertensive heart disease   . Iron deficiency anemia    "gets infusions q once in awhile" (04/27/2018)  . Macular edema bil   lazer work done previously  . Mild aortic stenosis    a. 03/2011 Echo: EF 55-60%, Mild AS. b. Mild by cath 02/2012; c. 01/2014 Echo: EF 65-70%, Gr  1 DD, mild AS, mildly dil LA.  . Multiple gastric ulcers 2006  . Myocardial infarction ([place]) 02/2012   stents (x1) at time  . Myocardial infarction [facility])    "2nd one was after 02/2012; don't know date" (04/27/2018)  . Obesity   . On home oxygen therapy    "2.5 w/CPAP" (04/27/2018)  . OSA on CPAP    CPAP settings 3 with oxygen 2.5 (04/27/2018)  . PAF (paroxysmal atrial fibrillation) ([place])    a. Newly dx 02/2012 & initiated on Coumadin (spont converted to NSR).  . Shortness of breath dyspnea   . Transfusion history    transfusions -1 month ago -2 units    PAST SURGICAL HISTORY: Past Surgical History:  Procedure Laterality Date  . ANGIOPLASTY    . BASAL CELL CARCINOMA EXCISION     "shoulder, arm X 2"  . CARDIAC CATHETERIZATION  2014  . CARDIAC CATHETERIZATION  2021   [facility] x1 stent  . CATARACT EXTRACTION W/ INTRAOCULAR LENS  IMPLANT, BILATERAL Bilateral 2016  . COLONOSCOPY WITH PROPOFOL N/A 10/15/2015   Procedure: COLONOSCOPY WITH PROPOFOL;  Surgeon: Gatha Mayer, MD;  Location: [facility] ENDOSCOPY;  Service: Endoscopy;  Laterality: N/A;  . CORONARY ANGIOPLASTY     Dr. Fletcher Anon  . CORONARY ARTERY BYPASS GRAFT  2011   CABG X3-4; [place], [place], [place],   . CORONARY ATHERECTOMY  04/27/2018  . CORONARY ATHERECTOMY N/A 04/27/2018   Procedure: CORONARY ATHERECTOMY;  Surgeon: Belva Crome, MD;  Location: [place] CV LAB;  Service: Cardiovascular;  Laterality: N/A;  . cyst L kidney  [place]  . ENDARTERECTOMY Left 08/30/2015   Procedure: ENDARTERECTOMY CAROTID;  Surgeon: Katha Cabal, MD;  Location: [facility] ORS;  Service: Vascular;  Laterality: Left;  . ESOPHAGOGASTRODUODENOSCOPY (EGD) WITH PROPOFOL N/A 10/15/2015   Procedure: ESOPHAGOGASTRODUODENOSCOPY (EGD) WITH PROPOFOL;  Surgeon: Gatha Mayer, MD;  Location: [facility] ENDOSCOPY;  Service: Endoscopy;  Laterality: N/A;  . EYE SURGERY Bilateral    "has macular edema cauterized" (04/27/2018)  . INGUINAL HERNIA  REPAIR Right   . LEFT HEART CATHETERIZATION WITH CORONARY ANGIOGRAM N/A 03/09/2012   Procedure: LEFT HEART CATHETERIZATION WITH CORONARY ANGIOGRAM;  Surgeon: Wellington Hampshire, MD;  Location: [place] [facility] CATH LAB;  Service: Cardiovascular;  Laterality: N/A;  . PERCUTANEOUS CORONARY STENT INTERVENTION (PCI-S)  03/09/2012   Procedure: PERCUTANEOUS CORONARY STENT INTERVENTION (PCI-S);  Surgeon: Wellington Hampshire, MD;  Location: [facility] CATH LAB;  Service: Cardiovascular;;  . RIGHT/LEFT HEART CATH AND CORONARY ANGIOGRAPHY N/A 04/25/2018   Procedure: RIGHT/LEFT HEART CATH AND CORONARY ANGIOGRAPHY;  Surgeon: Wellington Hampshire, MD;  Location: [place] CV LAB;  Service: Cardiovascular;  Laterality: N/A;    MEDICATIONS AT HOME: Prior to Admission medications   Medication Sig Start Date End Date Taking? Authorizing Provider  ALPRAZolam Duanne Moron) 0.25 MG tablet Take 0.25 mg by mouth daily as needed for anxiety.   Yes [provider]  amiodarone (PACERONE) 200 MG tablet Take 1 tablet (200 mg total) by mouth 2 (two) times daily for 6 days. Then take one tab po daily. Patient taking differently: Take 200 mg by mouth every evening.  12/28/19 01/11/20 Yes Para Skeans, MD  amLODipine (NORVASC) 5 MG tablet Take one tablet by mouth one time daily 07/25/14  Yes Wellington Hampshire, MD  clopidogrel (PLAVIX) 75 MG tablet TAKE 1 TABLET BY MOUTH DAILY Aug 13, 2019  Yes Wellington Hampshire, MD  Cyanocobalamin (B-12) 1000 MCG CAPS Take 1,000 mcg by mouth every evening.    Yes [provider]  Ferrous Gluconate-C-Folic Acid (IRON-C PO) Take 1 tablet by mouth daily.   Yes [provider]  furosemide (LASIX) 20 MG tablet Take 20 mg by mouth daily as needed for edema.   Yes [provider]  glimepiride (AMARYL) 4 MG tablet Take 4 mg by mouth daily.    Yes [provider]  insulin glargine (LANTUS) 100 UNIT/ML injection Inject 15 Units into the skin daily.    Yes [provider]  isosorbide  mononitrate (IMDUR) 30 MG 24 hr tablet Take 1 tablet (30 mg total) by mouth daily. 01/09/20 04/08/20 Yes Wellington Hampshire, MD  NOVOLOG FLEXPEN 100 UNIT/ML FlexPen Inject 6-36 Units into the skin 2 (two) times daily with a meal.  05/26/17  Yes [provider]  Omega-3 Fatty Acids (FISH OIL) 1000 MG CAPS Take 1,000 mg by mouth every evening.   Yes [provider]  OXYGEN Inhale 2.5 L/min into the lungs at bedtime. With CPAP   Yes [provider]  pantoprazole (PROTONIX) 40 MG tablet Take 1 tablet (40 mg total) by mouth daily. 12/28/19 01/27/20 Yes Para Skeans, MD  Polyethylene Glycol 400 (BLINK TEARS OP) Place 1 drop into both eyes daily as needed (dry eyes).   Yes [provider]  ramipril (ALTACE) 10 MG capsule Take 10 mg by mouth daily.  09/17/15  Yes [provider]  rosuvastatin (CRESTOR) 40 MG tablet TAKE ONE TABLET BY MOUTH EVERY DAY 06/05/19  Yes Wellington Hampshire, MD  sucralfate (CARAFATE) 1 g tablet Take 1 g by mouth 2 (two) times daily. 08/23/19 08/22/20 Yes [provider]  vitamin C (ASCORBIC ACID) 500 MG tablet Take 500 mg by mouth every evening.   Yes [provider]  warfarin (COUMADIN) 5 MG tablet Take 2.5 mg by mouth daily.  07/17/16  Yes [provider]  tadalafil (CIALIS) 10 MG tablet Take 10 mg by mouth as needed. 08/23/19 09/22/19  [provider]    FAMILY HISTORY: Family History  Problem Relation Age of Onset  . COPD Mother        alive 36  . Bladder Cancer Mother   . Heart attack Father        08-12-13 deceased  . Lung cancer Maternal Aunt   . Liver cancer Maternal Aunt   . Kidney disease Paternal Uncle   . Cancer Other        all paternal aunts and uncles  . Prostate cancer Neg Hx   . Kidney cancer Neg Hx     SOCIAL HISTORY:  reports that he has never smoked. He has never used smokeless tobacco. He reports current alcohol use. He reports that he does not use drugs.  PHYSICAL EXAM: Blood  pressure (!) 128/47, pulse (!) 52, temperature 99 F (37.2 C), temperature source Oral, resp. rate 19, height 5' 8"  (1.727 m), weight 81.6  kg, SpO2 100 %.  General appearance :Awake, alert, not in any distress.  Eyes:, pupils equally reactive to light and accomodation,no scleral icterus. HEENT: Atraumatic and Normocephalic Neck: supple, no JVD.  Resp:Good air entry bilaterally, no added sounds  CVS: S1 S2 regular, +2/2 systolic murmur GI: Bowel sounds present, Non tender and not distended with no gaurding, rigidity or rebound. Extremities: B/L Lower Ext shows no edema, both legs are warm to touch Neurology:  speech clear,Non focal, sensation is grossly intact. Psychiatric: Normal judgment and insight. Alert and oriented x 3. Normal mood. Musculoskeletal:gait appears to be normal.No digital cyanosis Skin:No Rash, warm and dry Wounds:N/A  LABS ON ADMISSION:  I have personally reviewed following labs and imaging studies  CBC: Recent Labs  Lab 01/08/20 1252 01/11/20 0925 01/11/20 1136  WBC 4.2 5.0 4.8  NEUTROABS 3.1 3.5  --   HGB 7.2* 6.5 Repeated and verified X2.* 6.2*  HCT 22.4* 19.9 cL* 21.4*  MCV 104.2* 104.9* 115.1*  PLT 233 216.0 979    Basic Metabolic Panel: Recent Labs  Lab 01/08/20 1252 01/11/20 1136  NA 135 135  K 4.8 4.4  CL 104 103  CO2 23 23  GLUCOSE 229* 219*  BUN 25* 16  CREATININE 1.59* 1.49*  CALCIUM 8.3* 8.5*    GFR: Estimated Creatinine Clearance: 42.7 mL/min (A) (by C-G formula based on SCr of 1.49 mg/dL (H)).  Liver Function Tests: Recent Labs  Lab 01/11/20 1136  AST 22  ALT 27  ALKPHOS 58  BILITOT 0.5  PROT 5.3*  ALBUMIN 3.1*   No results for input(s): LIPASE, AMYLASE in the last 168 hours. No results for input(s): AMMONIA in the last 168 hours.  Coagulation Profile: Recent Labs  Lab 01/10/20 0839 01/11/20 1524  INR 5.3* 2.6*    Cardiac Enzymes: No results for input(s): CKTOTAL, CKMB, CKMBINDEX, TROPONINI in the last 168  hours.  BNP (last 3 results) No results for input(s): PROBNP in the last 8760 hours.  HbA1C: No results for input(s): HGBA1C in the last 72 hours.  CBG: No results for input(s): GLUCAP in the last 168 hours.  Lipid Profile: No results for input(s): CHOL, HDL, LDLCALC, TRIG, CHOLHDL, LDLDIRECT in the last 72 hours.  Thyroid Function Tests: No results for input(s): TSH, T4TOTAL, FREET4, T3FREE, THYROIDAB in the last 72 hours.  Anemia Panel: No results for input(s): VITAMINB12, FOLATE, FERRITIN, TIBC, IRON, RETICCTPCT in the last 72 hours.  Urine analysis:    Component Value Date/Time   COLORURINE YELLOW (A) 07/26/2017 1341   APPEARANCEUR CLEAR (A) 07/26/2017 1341   APPEARANCEUR Clear 01/21/2013 0441   LABSPEC 1.011 07/26/2017 1341   LABSPEC 1.005 01/21/2013 0441   PHURINE 6.0 07/26/2017 1341   GLUCOSEU 150 (A) 07/26/2017 1341   GLUCOSEU >=500 01/21/2013 0441   HGBUR NEGATIVE 07/26/2017 1341   BILIRUBINUR NEGATIVE 07/26/2017 1341   BILIRUBINUR Negative 01/21/2013 0441   KETONESUR NEGATIVE 07/26/2017 1341   PROTEINUR NEGATIVE 07/26/2017 1341   UROBILINOGEN 1.0 02/17/2010 0532   NITRITE NEGATIVE 07/26/2017 1341   LEUKOCYTESUR NEGATIVE 07/26/2017 1341   LEUKOCYTESUR Negative 01/21/2013 0441    Sepsis Labs: Lactic Acid, Venous No results found for: [place]   Microbiology: No results found for this or any previous visit (from the past 240 hour(s)).    RADIOLOGIC STUDIES ON ADMISSION: No results found.   EKG:  Personally reviewed.  Sinus bradycardia with diffuse T wave inversions  ASSESSMENT AND PLAN: Upper GI bleed with acute blood loss anemia: Already on PPI  infusion-agree with 2 units of PRBC-that has been started here in the emergency room.  Have consulted GI-we will be on clear liquids-GI wants to wait for cardiology opinion before proceeding with endoscopic evaluation.  Follow CBC periodically-transfuse as needed in an attempt to keep hemoglobin more than  8-as patient appears to have symptoms including chest pain with severe anemia.  Chest pain: Has known history of angina-underlying CAD-most recent PCI in April 2021.  Given active GI bleed and severity of anemia-hold Plavix (last dose on 8/19).  EKG with diffuse T wave inversions-however troponins are negative.  Await cardiology opinion.  PAF: Sinus rhythm this afternoon-has had issues with bradycardia-and pacemaker placement has been contemplated in the past.  Continue amiodarone-continue to hold Coumadin (last dose on 8/17).  Recheck INR tomorrow-once endoscopic evaluation day has been confirmed-and if INR still elevated-we can consider vitamin K/FFP.  But in the meantime-we will allow INR to downtrend.  Aortic stenosis: Stable for outpatient follow-up with cardiology.  HTN: BP soft-hold ramipril, amlodipine, Lasix-continue Imdur given intermittent chest pain.  DM-2: Placed on clear liquids-hence decrease Lantus to 10 units-continue SSI.  Follow CBGs and adjust.  OSA: CPAP/O2 nightly.   Further plan will depend as patient's clinical course evolves and further radiologic and laboratory data become available. Patient will be monitored closely.  Above noted plan was discussed with patient face to face at bedside, he was in agreement.   CONSULTS: GI,Cards  DVT Prophylaxis: SCD's  Code Status: Full Code  Disposition Plan:  Discharge back homepossibly in 2-3 days, depending on clinical course  Admission status: Inpatient  going to tele  The medical decision making on this patient was of high complexity and the patient is at high risk for clinical deterioration, therefore this is a level 3 visit.    Total time spent  55 minutes.Greater than 50% of this time was spent in counseling, explanation of diagnosis, planning of further management, and coordination of care.  Severity of illness: The appropriate patient status for this patient is INPATIENT. Inpatient status is judged to be  reasonable and necessary in order to provide the required intensity of service to ensure the patient's safety. [name] presenting symptoms, physical exam findings, and initial radiographic and laboratory data in the context of their chronic comorbidities is felt to place them at high risk for further clinical deterioration. Furthermore, it is not anticipated that the patient will be medically stable for discharge from the hospital within 2 midnights of admission. The following factors support the patient status of inpatient.   " [name] presenting symptoms include melena, chest pain. " The worrisome physical exam findings include pale mucous membranes " The initial radiographic and laboratory data are worrisome because of hemoglobin of 6.5 " The chronic co-morbidities include CAD, PAF, moderate aortic stenosis, chronic anemia   * I certify that at the point of admission it is my clinical judgment that the patient will require inpatient hospital care spanning beyond 2 midnights from the point of admission due to high intensity of service, high risk for further deterioration and high frequency of surveillance required.  Oren Binet [facility] Pager [phone]  If 7PM-7AM, please contact night-coverage  Please page via [email or web address]  Go to [email or web address] and use [facility] universal password to access. If you do not have the password, please contact the hospital operator.  Locate the [facility] provider you are looking for under [facility] and page to a number that you can be directly reached. If you still  have difficulty reaching the provider, please page the [facility] (Director on Call) for the Hospitalists listed on amion for assistance.  01/11/2020, 5:09 PM

## 2020-01-11 NOTE — Telephone Encounter (Signed)
Spoke with patient. There are not current beds available for admission. Per patient, he was advised to go to the ER for low hgb, which dropped to 6.5. Dr. Rogue Bussing made aware. Patient will keep our office updated if anything changes. Maudie Mercury, Infusion nurse made aware that patient will be sent to ER.

## 2020-01-11 NOTE — ED Notes (Addendum)
Pt refused stool occult and reported had one done today at PCP office and reports stool is dark and tarry color

## 2020-01-11 NOTE — Progress Notes (Signed)
Patient seen in our office this AM and sent to the ED for anemia/drop in Hgb, anginal chest pain, heme positive stool.  See note from [facility], PA-C, which will serve as GI consult note.  GI will see the patient again tomorrow on rounds.  Needs transfusion and cardiology evaluation so that we can coordinate anticoagulation/antiplatelet therapy, endoscopic evaluation, and pacemaker placement.

## 2020-01-11 NOTE — Progress Notes (Signed)
Subjective:    Patient ID: Seth Hardy, male    DOB: 08/07/1945, 74 y.o.   MRN: [id number]  HPI Seth Hardy is a very pleasant 74 year old white male, known previously to Dr. Carlean Purl from EGD and colonoscopy in 2017.  Patient is currently referred by [facility] for evaluation of progressive anemia, iron deficiency. Patient has multiple comorbidities including coronary artery disease, status post CABG.  He had a non-STEMI about 4 months ago and had stent placed and has since been on Plavix.  He has history of atrial fibrillation for which she is on Coumadin, carotid disease, hypertension, adult onset diabetes mellitus and very remote history of peptic ulcer disease. Patient has been having issues with atrial fib and was admitted 8/2 through 12/28/2019 with A. fib with RVR.  He converted spontaneously and was discharged home on amiodarone.  He has been having problems with bradycardia and is scheduled for pacemaker placement with [facility]/Dr. [name]/Dr. Quentin Ore on 01/26/2020. Patient has been having progressive weakness and severe fatigue over the past couple of weeks.  He is unable to ambulate across the room without having chest discomfort. He has been having dark stools but says his stools are always dark as he has been on chronic iron, he has not noticed any overt melena or hematochezia.  He denies any abdominal pain.  No regular heartburn or indigestion has had some nausea and belching.  Appetite has been off a bit over the past week or so. Review of labs in April 2021 hemoglobin was 10.7, on 12/28/2019 hemoglobin 9.7 01/08/2020 hemoglobin 7.2 hematocrit of 22.4, MCV of 104, ferritin 22/serum iron 70/TIBC C3 77/iron sat 19, WBC 4.2, platelets 233. INR yesterday was 5.3. Patient has been evaluated by hematology at [facility] and is actually scheduled to have outpatient transfusion x2 today.  He got IV Feraheme x1 yesterday.  Hematology is considering bone marrow biopsy but wanted him  evaluated by GI first.  Patient has been having problems with iron deficiency over the past couple of years and actually had undergone colonoscopy in 2017 for iron deficiency.  Colonoscopy was normal at that time.  EGD showed some mild erythema in the prepyloric region and he had 1 polyp in the gastric cardia which was hyperplastic and removed.  Patient and wife relate that he was Hemoccult negative during recent hospitalization.  Stat hemoglobin was done in our office today, hemoglobin down to 6.5. Patient held Coumadin last night  Review of Systems Pertinent positive and negative review of systems were noted in the above HPI section.  All other review of systems was otherwise negative.  Outpatient Encounter Medications as of 01/11/2020  Medication Sig  . ALPRAZolam (XANAX) 0.25 MG tablet Take 0.25 mg by mouth daily as needed for anxiety.  Marland Kitchen amLODipine (NORVASC) 5 MG tablet Take one tablet by mouth one time daily  . clopidogrel (PLAVIX) 75 MG tablet TAKE 1 TABLET BY MOUTH DAILY  . Cyanocobalamin (B-12) 1000 MCG CAPS Take 1,000 mcg by mouth every evening.   . Ferrous Gluconate-C-Folic Acid (IRON-C PO) Take 1 tablet by mouth daily.  Marland Kitchen glimepiride (AMARYL) 4 MG tablet Take 4 mg by mouth daily.   . insulin glargine (LANTUS) 100 UNIT/ML injection Inject 15 Units into the skin daily.   . isosorbide mononitrate (IMDUR) 30 MG 24 hr tablet Take 1 tablet (30 mg total) by mouth daily.  Marland Kitchen NOVOLOG FLEXPEN 100 UNIT/ML FlexPen Inject 6-36 Units into the skin 2 (two) times daily with a  meal.   . Omega-3 Fatty Acids (FISH OIL) 1000 MG CAPS Take 1,000 mg by mouth every evening.  . OXYGEN Inhale 2.5 L/min into the lungs at bedtime. With CPAP  . pantoprazole (PROTONIX) 40 MG tablet Take 1 tablet (40 mg total) by mouth daily.  . Polyethylene Glycol 400 (BLINK TEARS OP) Place 1 drop into both eyes daily as needed (dry eyes).  . ramipril (ALTACE) 10 MG capsule Take 10 mg by mouth daily.   . rosuvastatin  (CRESTOR) 40 MG tablet TAKE ONE TABLET BY MOUTH EVERY DAY  . sucralfate (CARAFATE) 1 g tablet Take 1 g by mouth 2 (two) times daily.  . vitamin C (ASCORBIC ACID) 500 MG tablet Take 500 mg by mouth every evening.  . warfarin (COUMADIN) 5 MG tablet Take 2.5-5 mg by mouth See admin instructions. Take 5 mg at night on Sun, Mon, Wed, Thur, and Fri.  Take 2.5 mg at night on Tues and Sat  . zinc gluconate 50 MG tablet Take 50 mg by mouth daily.   Marland Kitchen amiodarone (PACERONE) 200 MG tablet Take 1 tablet (200 mg total) by mouth 2 (two) times daily for 6 days. Then take one tab po daily.  . tadalafil (CIALIS) 10 MG tablet Take 10 mg by mouth as needed.   No facility-administered encounter medications on file as of 01/11/2020.   No Known Allergies Patient Active Problem List   Diagnosis Date Noted  . Atrial fibrillation with RVR ([place])   . Rapid atrial fibrillation ([place]) 12/26/2019  . CAD (coronary artery disease), native coronary artery 04/27/2018  . Effort angina ([facility])   . Benign essential hypertension 02/11/2016  . Microcytic anemia   . Gastric polyp   . BP (high blood pressure) 09/25/2015  . Aortic heart valve narrowing 09/23/2015  . Carotid artery stenosis with cerebral infarction ([place]) 08/30/2015  . Iron deficiency anemia due to chronic blood loss 08/21/2015  . B12 deficiency 08/19/2015  . Carotid disease, bilateral ([place])   . CAD (coronary artery disease)   . Mild aortic stenosis   . Hypertensive heart disease   . Diabetes mellitus type II, controlled ([place])   . Hyperlipidemia 03/14/2015  . Carotid stenosis, asymptomatic, left 09/13/2014  . 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 History of atrial fibrillation 01/04/2014  . Obstructive apnea 01/04/2014  . Atrial fibrillation ([place]) 03/23/2012  . Long term (current) use of anticoagulants 03/14/2012  . Non-STEMI (non-ST elevated myocardial infarction) ([place]) 03/09/2012  . Carotid stenosis  05/28/2010  . CAD, ARTERY BYPASS GRAFT 04/07/2010  . CAD, NATIVE VESSEL 03/13/2009  . Aortic valve stenosis, mild 11/01/2008   Social History   Socioeconomic History  . Marital status: Married    Spouse name: Not on file  . Number of children: 2  . Years of education: Not on file  . Highest education level: Not on file  Occupational History  . Occupation: retired  Tobacco Use  . Smoking status: Never Smoker  . Smokeless tobacco: Never Used  . Tobacco comment: tobacco use - no.no passive smoke in home  Vaping Use  . Vaping Use: Never used  Substance and Sexual Activity  . Alcohol use: Yes    Comment: 04/27/2018 "couple drinks/year; if that"  . Drug use: Never  . Sexual activity: Not Currently  Other Topics Concern  . Not on file  Social History Narrative   Full time. Gets regular exercise. Lives in [place] with his wife.  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   Social Determinants of Health   Financial Resource Strain:   . Difficulty of Paying Living Expenses: Not on file  Food Insecurity:   . Worried About Charity fundraiser in the Last Year: Not on file  . Ran Out of Food in the Last Year: Not on file  Transportation Needs:   . Lack of Transportation (Medical): Not on file  . Lack of Transportation (Non-Medical): Not on file  Physical Activity:   . Days of Exercise per Week: Not on file  . Minutes of Exercise per Session: Not on file  Stress:   . Feeling of Stress : Not on file  Social Connections:   . Frequency of Communication with Friends and Family: Not on file  . Frequency of Social Gatherings with Friends and Family: Not on file  . Attends Religious Services: Not on file  . Active Member of Clubs or Organizations: Not on file  . Attends Archivist Meetings: Not on file  . Marital Status: Not on file  Intimate Partner Violence:   . Fear of Current or Ex-Partner: Not on file  . Emotionally Abused: Not on file  . Physically Abused: Not on file  .  Sexually Abused: Not on file    Mr. Hally's family history includes Bladder Cancer in his mother; COPD in his mother; Cancer in an other family member; Heart attack in his father; Kidney disease in his paternal uncle; Liver cancer in his maternal aunt; Lung cancer in his maternal aunt.      Objective:    Vitals:   01/11/20 0819  BP: 140/62  Pulse: (!) 54    Physical Exam Well-developed well-nourished older white male in no acute distress.  Height, Weight 180, BMI 27.3  HEENT; nontraumatic normocephalic, EOMI, PER R LA, sclera anicteric.  Conjunctiva pale, lips pale Oropharynx; not done Neck; supple, no JVD Cardiovascular; regular rate and rhythm with X7-W6, +systolic murmur , rub or gallop Pulmonary; Clear bilaterally Abdomen; soft, nontender, nondistended, no palpable mass or hepatosplenomegaly, bowel sounds are active Rectal; dark brown heme positive Skin; benign exam, no jaundice rash or appreciable lesions Extremities; trace edema ankles Neuro/Psych; alert and oriented x4, grossly nonfocal mood and affect appropriate       Assessment & Plan:   #4 74 year old white male with multiple comorbidities, with 3-1/2 g drop in hemoglobin over the past 2 weeks, now with dark heme positive stool in setting of Plavix and Coumadin and supratherapeutic INR yesterday at 5.3.  Hemoglobin 6.5 today Stool dark and heme positive Patient very symptomatic, comes in in a wheelchair unable to ambulate without anginal symptoms.  Etiology of GI blood loss not clear, negative EGD and colonoscopy 2017 as to source of iron deficiency at that time. Rule out possible AVMs, rule out chronic gastropathy, peptic ulcer disease  #2 bradycardia awaiting pacemaker placement 01/26/2020 #3 recent admit with A. fib with RVR now on amiodarone #4 status post MI about 4 months ago, stent placement-on Plavix 5.  Chronic anticoagulation with Coumadin 6.  Sleep apnea with CPAP use oxygen at night 7.   Hypertension 8.  Adult onset diabetes mellitus 9.  Chronic component of anemia which has been macrocytic mild iron deficiency and also has had intermittent leukopenia.  Will eventually need bone marrow biopsy.  Plan; patient needs to be admitted to the hospital today, do not feel that he is safe to proceed with outpatient transfusions. I contacted [facility] hospitalist Dr. Lorin Mercy who has accepted patient on  her service.  Unfortunately all of the hospitals are full and unable to get him direct admitted.  He will need to proceed to the hospital for admission through the emergency room. Patient needs to get transfused 2- 3 units of packed RBCs. Hold Coumadin Cardiology consultation so can coordinate timing of endoscopic evaluation anticoagulation, antiplatelet therapy,and pacemaker placement.  GI will follow, I have alerted our inpatient team.  Alfredia Ferguson PA-C 01/11/2020   Cc: Rusty Aus, MD

## 2020-01-11 NOTE — Telephone Encounter (Signed)
Please inform pt that I have sent Dr.[name] a message; awaiting to hear back from him.  GB

## 2020-01-11 NOTE — ED Provider Notes (Signed)
[facility] EMERGENCY DEPARTMENT Provider Note   CSN: [id number] Arrival date & time: 01/11/20  1103     History Chief Complaint  Patient presents with  . Anemia    Seth Hardy is a 74 y.o. male with a history of CAD, T2DM, hypertension, hyperlipidemia, iron deficiency anemia, paroxysmal atrial fibrillation, chronic anticoagulation on warfarin, and multiple gastric ulcers who presents to the emergency department for admission from his GI providers office today.  Patient states he has had a few days of fatigue which is aggravated by activity, no alleviating factors.  Has had some associated palpitations and chest tightness with activity that usually lasts about 10-15 minutes prior to resolution.  He states he has had dark stools for a few years now, recently they have been black in color.  He went to his GI provider's office today, he was told his stool was positive for blood and his hemoglobin was low-instructed to come to the emergency department for admission.  He denies abdominal pain, fever, chills, nausea, vomiting, shortness of breath, or syncope.  HPI     Past Medical History:  Diagnosis Date  . Anemia 08/2015   received 2 units rbc one week ago, 3 IV iron infusion -last 1 week.  . Basal cell carcinoma of skin 2012   removed several spots from arms and back  . CAD (coronary artery disease)    a. 01/2010 CABG x 4: LIMA->LAD, VG->D1, VG->OM1, VG->PDA. b. NSTEMI 02/2012 in setting of AF-RVR with cath s/p BMS to RCA (plan for 1 month, possibly up to 3 months of Plavix)  . Carotid disease, bilateral ([place])    a. 03/2011 U/S: 40-59% bilat Carotid dzs;  b. 04/2015 Carotid U/S: 40-59% bilat ICA stenosis; c. 05/2015 CTA Neck: 97% RICA, 02% LICA, mod-marked R vertebral stenosis, mod L vertebral stenosis.  . Chronic kidney disease    small obtusion per pcp. ultrasound done on 08/29/2015  . Diabetes mellitus type II, controlled ([place])    a. Variable CBG 02/2012 - several  meds adjusted.  Marland Kitchen Dysrhythmia    intermittent Atrial Fibrillation  . GERD (gastroesophageal reflux disease)   . Heart murmur   . Hyperlipidemia   . Hypertension   . Hypertensive heart disease   . Iron deficiency anemia    "gets infusions q once in awhile" (04/27/2018)  . Macular edema bil   lazer work done previously  . Mild aortic stenosis    a. 03/2011 Echo: EF 55-60%, Mild AS. b. Mild by cath 02/2012; c. 01/2014 Echo: EF 65-70%, Gr 1 DD, mild AS, mildly dil LA.  . Multiple gastric ulcers 2006  . Myocardial infarction ([place]) 02/2012   stents (x1) at time  . Myocardial infarction [facility])    "2nd one was after 02/2012; don't know date" (04/27/2018)  . Obesity   . On home oxygen therapy    "2.5 w/CPAP" (04/27/2018)  . OSA on CPAP    CPAP settings 3 with oxygen 2.5 (04/27/2018)  . PAF (paroxysmal atrial fibrillation) ([place])    a. Newly dx 02/2012 & initiated on Coumadin (spont converted to NSR).  . Shortness of breath dyspnea   . Transfusion history    transfusions -1 month ago -2 units    Patient Active Problem List   Diagnosis Date Noted  . Atrial fibrillation with RVR ([place])   . Rapid atrial fibrillation ([place]) 12/26/2019  . CAD (coronary artery disease), native coronary artery 04/27/2018  . Effort angina ([facility])   . Benign  essential hypertension 02/11/2016  . Microcytic anemia   . Gastric polyp   . BP (high blood pressure) 09/25/2015  . Aortic heart valve narrowing 09/23/2015  . Carotid artery stenosis with cerebral infarction ([place]) 08/30/2015  . Iron deficiency anemia due to chronic blood loss 08/21/2015  . B12 deficiency 08/19/2015  . Carotid disease, bilateral ([place])   . CAD (coronary artery disease)   . Mild aortic stenosis   . Hypertensive heart disease   . Diabetes mellitus type II, controlled ([place])   . Hyperlipidemia 03/14/2015  . Carotid stenosis, asymptomatic, left 09/13/2014  . 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  History of atrial fibrillation 01/04/2014  . Obstructive apnea 01/04/2014  . Atrial fibrillation ([place]) 03/23/2012  . Long term (current) use of anticoagulants 03/14/2012  . Non-STEMI (non-ST elevated myocardial infarction) ([place]) 03/09/2012  . Carotid stenosis 05/28/2010  . CAD, ARTERY BYPASS GRAFT 04/07/2010  . CAD, NATIVE VESSEL 03/13/2009  . Aortic valve stenosis, mild 11/01/2008    Past Surgical History:  Procedure Laterality Date  . ANGIOPLASTY    . BASAL CELL CARCINOMA EXCISION     "shoulder, arm X 2"  . CARDIAC CATHETERIZATION  08-21-2012  . CARDIAC CATHETERIZATION  08/22/19   [facility] x1 stent  . CATARACT EXTRACTION W/ INTRAOCULAR LENS  IMPLANT, BILATERAL Bilateral 08/22/14  . COLONOSCOPY WITH PROPOFOL N/A 10/15/2015   Procedure: COLONOSCOPY WITH PROPOFOL;  Surgeon: Gatha Mayer, MD;  Location: [facility] ENDOSCOPY;  Service: Endoscopy;  Laterality: N/A;  . CORONARY ANGIOPLASTY     Dr. Fletcher Anon  . CORONARY ARTERY BYPASS GRAFT  2009/08/21   CABG X3-4; [place], [place], [place],   . CORONARY ATHERECTOMY  04/27/2018  . CORONARY ATHERECTOMY N/A 04/27/2018   Procedure: CORONARY ATHERECTOMY;  Surgeon: Belva Crome, MD;  Location: [facility] CV LAB;  Service: Cardiovascular;  Laterality: N/A;  . cyst L kidney  [place]  . ENDARTERECTOMY Left 08/30/2015   Procedure: ENDARTERECTOMY CAROTID;  Surgeon: Katha Cabal, MD;  Location: [facility] ORS;  Service: Vascular;  Laterality: Left;  . ESOPHAGOGASTRODUODENOSCOPY (EGD) WITH PROPOFOL N/A 10/15/2015   Procedure: ESOPHAGOGASTRODUODENOSCOPY (EGD) WITH PROPOFOL;  Surgeon: Gatha Mayer, MD;  Location: [facility] ENDOSCOPY;  Service: Endoscopy;  Laterality: N/A;  . EYE SURGERY Bilateral    "has macular edema cauterized" (04/27/2018)  . INGUINAL HERNIA REPAIR Right   . LEFT HEART CATHETERIZATION WITH CORONARY ANGIOGRAM N/A 03/09/2012   Procedure: LEFT HEART CATHETERIZATION WITH CORONARY ANGIOGRAM;  Surgeon: Wellington Hampshire, MD;  Location: [facility] CATH  LAB;  Service: Cardiovascular;  Laterality: N/A;  . PERCUTANEOUS CORONARY STENT INTERVENTION (PCI-S)  03/09/2012   Procedure: PERCUTANEOUS CORONARY STENT INTERVENTION (PCI-S);  Surgeon: Wellington Hampshire, MD;  Location: [facility] CATH LAB;  Service: Cardiovascular;;  . RIGHT/LEFT HEART CATH AND CORONARY ANGIOGRAPHY N/A 04/25/2018   Procedure: RIGHT/LEFT HEART CATH AND CORONARY ANGIOGRAPHY;  Surgeon: Wellington Hampshire, MD;  Location: [facility] CV LAB;  Service: Cardiovascular;  Laterality: N/A;       Family History  Problem Relation Age of Onset  . COPD Mother        alive 73  . Bladder Cancer Mother   . Heart attack Father        08/21/13 deceased  . Lung cancer Maternal Aunt   . Liver cancer Maternal Aunt   . Kidney disease Paternal Uncle   . Cancer Other        all paternal  aunts and uncles  . Prostate cancer Neg Hx   . Kidney cancer Neg Hx     Social History   Tobacco Use  . Smoking status: Never Smoker  . Smokeless tobacco: Never Used  . Tobacco comment: tobacco use - no.no passive smoke in home  Vaping Use  . Vaping Use: Never used  Substance Use Topics  . Alcohol use: Yes    Comment: 04/27/2018 "couple drinks/year; if that"  . Drug use: Never    Home Medications Prior to Admission medications   Medication Sig Start Date End Date Taking? Authorizing Provider  amiodarone (PACERONE) 200 MG tablet Take 1 tablet (200 mg total) by mouth 2 (two) times daily for 6 days. Then take one tab po daily. Patient taking differently: Take 200 mg by mouth every evening.  12/28/19 01/11/20 Yes Para Skeans, MD  amLODipine (NORVASC) 5 MG tablet Take one tablet by mouth one time daily 07/25/14  Yes Wellington Hampshire, MD  Cyanocobalamin (B-12) 1000 MCG CAPS Take 1,000 mcg by mouth every evening.    Yes [provider]  Ferrous Gluconate-C-Folic Acid (IRON-C PO) Take 1 tablet by mouth daily.   Yes [provider]  furosemide (LASIX) 20 MG tablet Take 20 mg by mouth daily as needed  for edema.   Yes [provider]  glimepiride (AMARYL) 4 MG tablet Take 4 mg by mouth daily.    Yes [provider]  insulin glargine (LANTUS) 100 UNIT/ML injection Inject 15 Units into the skin daily.    Yes [provider]  isosorbide mononitrate (IMDUR) 30 MG 24 hr tablet Take 1 tablet (30 mg total) by mouth daily. 01/09/20 04/08/20 Yes Wellington Hampshire, MD  NOVOLOG FLEXPEN 100 UNIT/ML FlexPen Inject 6-36 Units into the skin 2 (two) times daily with a meal.  05/26/17  Yes [provider]  Omega-3 Fatty Acids (FISH OIL) 1000 MG CAPS Take 1,000 mg by mouth every evening.   Yes [provider]  OXYGEN Inhale 2.5 L/min into the lungs at bedtime. With CPAP   Yes [provider]  pantoprazole (PROTONIX) 40 MG tablet Take 1 tablet (40 mg total) by mouth daily. 12/28/19 01/27/20 Yes Para Skeans, MD  Polyethylene Glycol 400 (BLINK TEARS OP) Place 1 drop into both eyes daily as needed (dry eyes).   Yes [provider]  ramipril (ALTACE) 10 MG capsule Take 10 mg by mouth daily.  09/17/15  Yes [provider]  rosuvastatin (CRESTOR) 40 MG tablet TAKE ONE TABLET BY MOUTH EVERY DAY 06/05/19  Yes Wellington Hampshire, MD  sucralfate (CARAFATE) 1 g tablet Take 1 g by mouth 2 (two) times daily. 08/23/19 08/22/20 Yes [provider]  vitamin C (ASCORBIC ACID) 500 MG tablet Take 500 mg by mouth every evening.   Yes [provider]  warfarin (COUMADIN) 5 MG tablet Take 2.5 mg by mouth daily.  07/17/16  Yes [provider]  ALPRAZolam Duanne Moron) 0.25 MG tablet Take 0.25 mg by mouth daily as needed for anxiety.    [provider]  clopidogrel (PLAVIX) 75 MG tablet TAKE 1 TABLET BY MOUTH DAILY 07/19/19   Wellington Hampshire, MD  tadalafil (CIALIS) 10 MG tablet Take 10 mg by mouth as needed. 08/23/19 09/22/19  [provider]    Allergies    Patient has no known allergies.  Review of Systems   Review of Systems    Constitutional: Positive for fatigue. Negative for chills and fever.  Respiratory: Negative for shortness of breath.   Cardiovascular: Positive for chest pain and palpitations.  Gastrointestinal: Positive for blood in stool. Negative for abdominal pain, constipation, diarrhea, nausea and vomiting.  Neurological: Negative for syncope.  All other systems reviewed and are negative.   Physical Exam Updated Vital Signs BP (!) 124/43   Pulse (!) 54   Temp 98.4 F (36.9 C) (Oral)   Resp 18   Ht 5\' 8"  (1.727 m)   Wt 81.6 kg   SpO2 98%   BMI 27.37 kg/m   Physical Exam Vitals and nursing note reviewed.  Constitutional:      General: He is not in acute distress.    Appearance: He is well-developed. He is not toxic-appearing.  HENT:     Head: Normocephalic and atraumatic.  Eyes:     General:        Right eye: No discharge.        Left eye: No discharge.     Comments: Conjunctival pallor noted.  Cardiovascular:     Rate and Rhythm: Regular rhythm. Bradycardia present.  Pulmonary:     Effort: Pulmonary effort is normal. No respiratory distress.     Breath sounds: Normal breath sounds. No wheezing, rhonchi or rales.  Abdominal:     General: There is no distension.     Palpations: Abdomen is soft.     Tenderness: There is no abdominal tenderness.  Genitourinary:    Comments: DRE deferred as this was performed at GI office. Musculoskeletal:     Cervical back: Neck supple.  Skin:    General: Skin is warm and dry.     Findings: No rash.  Neurological:     Mental Status: He is alert.     Comments: Clear speech.   Psychiatric:        Behavior: Behavior normal.     ED Results / Procedures / Treatments   Labs (all labs ordered are listed, but only abnormal results are displayed) Labs Reviewed  COMPREHENSIVE METABOLIC PANEL - Abnormal; Notable for the following components:      Result Value   Glucose, Bld 219 (*)    Creatinine, Ser 1.49 (*)    Calcium 8.5 (*)    Total  Protein 5.3 (*)    Albumin 3.1 (*)    GFR calc non Af Amer 46 (*)    GFR calc Af Amer 53 (*)    All other components within normal limits  CBC - Abnormal; Notable for the following components:   RBC 1.86 (*)    Hemoglobin 6.2 (*)    HCT 21.4 (*)    MCV 115.1 (*)    MCHC 29.0 (*)    RDW 18.0 (*)    All other components within normal limits  SARS CORONAVIRUS 2 BY RT PCR (HOSPITAL ORDER, [facility])  PROTIME-INR  POC OCCULT BLOOD, ED  TYPE AND SCREEN  TROPONIN I (HIGH SENSITIVITY)    EKG EKG Interpretation  Date/Time:  Thursday January 11 2020 15:12:10 EDT Ventricular Rate:  51 PR Interval:    QRS Duration: 91 QT Interval:  495 QTC Calculation: 456 R Axis:   51 Text Interpretation: Sinus rhythm Abnormal T, consider ischemia, diffuse leads Baseline wander in lead(s) V3 V5 Confirmed by Davonna Belling [phone]) on 01/11/2020 3:28:17 PM   Radiology No results found.  Procedures .Critical Care Performed by: Amaryllis Dyke, PA-C Authorized by: Amaryllis Dyke, PA-C     (including critical care time)  CRITICAL CARE Performed by: Kennith Maes   Total critical care time: 40 minutes  Critical care time was exclusive of separately billable procedures and treating other patients.  Critical care was necessary to treat or prevent imminent or life-threatening deterioration.  Critical care was time spent personally by me on the following activities: development of treatment plan with patient and/or surrogate as well as nursing, discussions with consultants, evaluation of patient's response to treatment, examination of patient, obtaining history from patient or surrogate, ordering and performing treatments and interventions, ordering and review of laboratory studies, ordering and review of radiographic studies, pulse oximetry and re-evaluation of patient's condition.   Medications Ordered in ED Medications  pantoprazole (PROTONIX)  80 mg in sodium chloride 0.9 % 100 mL IVPB (has no administration in time range)  pantoprazole (PROTONIX) 80 mg in sodium chloride 0.9 % 100 mL (0.8 mg/mL) infusion (has no administration in time range)  0.9 %  sodium chloride infusion (Manually program via Guardrails IV Fluids) (has no administration in time range)    ED Course  I have reviewed the triage vital signs and the nursing notes.  Pertinent labs & imaging results that were available during my care of the patient were reviewed by me and considered in my medical decision making (see chart for details).  Seth Hardy was evaluated in Emergency Department on 01/11/2020 for the symptoms described in the history of present illness. He/she was evaluated in the context of the global COVID-19 pandemic, which necessitated consideration that the patient might be at risk for infection with the SARS-CoV-2 virus that causes COVID-19. Institutional protocols and algorithms that pertain to the evaluation of patients at risk for COVID-19 are in a state of rapid change based on information released by regulatory bodies including the CDC and federal and state organizations. These policies and algorithms were followed during the patient's care in the ED.    MDM Rules/Calculators/A&P                         Patient presents to the ED with complaints of fatigue over the past few days found to be anemic and fecal occult positive at GI office.  On arrival he is mildly bradycardic, vitals are otherwise unremarkable.  His abdomen is nontender without peritoneal signs.  Rectal exam was performed at GI office with documented dark stool that is heme positive, we discussed repeat in the emergency department which patient would like to avoid, I feel this is reasonable.  Additional history obtained:  Additional history obtained from chart review and nursing note reviewed.. Previous records obtained and reviewed- progress note in from GI team for today  Lab Tests:    I reviewed interpreted labs, which included:  CBC: Critical anemia with hemoglobin 6.2 and hematocrit 21.4.  No leukocytosis.  Platelets within normal limits. CMP: Renal function at baseline.  Hyperglycemia without acidosis or anion gap elevation.  I added on a troponin, PT/INR, and Covid swab as well as an EKG. Protonix bolus and drip initiated in the emergency department.  Plan to admit to hospitalist service.   EKG: Sinus rhythm Abnormal T, consider ischemia, diffuse leads - no STEMI.  PT/INR: therapeutic  Troponin: 13- no significant elevation  16:22: CONSULT: Discussed with hospitalist Dr. Sloan Leiter- accepts admission.   Findings and plan of care discussed with supervising physician Dr. Alvino Chapel who has evaluated the patient & is in agreement.   Portions of this note were generated with Lobbyist.  Dictation errors may occur despite best attempts at proofreading.  Final Clinical Impression(s) / ED Diagnoses Final diagnoses:  Symptomatic anemia  Gastrointestinal hemorrhage, unspecified gastrointestinal hemorrhage type    Rx / DC Orders ED Discharge Orders    None       Amaryllis Dyke, PA-C 01/11/20 1632    Davonna Belling, MD 01/12/20 1517

## 2020-01-11 NOTE — Progress Notes (Signed)
2nd unit of blood has finished infusing. VSS. Will continue to monitor patient.

## 2020-01-11 NOTE — ED Triage Notes (Signed)
Patient arrived by POV for blood transfusion. Patient states that he was to go to [facility] today for blood but his MD directed him here for admission. Patient denies pain. Alert and oriented. Reports Hgb 6.5

## 2020-01-11 NOTE — Telephone Encounter (Signed)
Patient informed that Dr. Jacinto Reap has left a vm for Dr. Fletcher Anon to return his phone call. Still waiting to hear back from cardiology. Patient stated that he is presently in GI dept and they may admit him to the unit due to +GI occult. He will call me back with the definite decision to admit patient.

## 2020-01-11 NOTE — Telephone Encounter (Signed)
Wife tearful and in distraught. Patient in [facility] ER presently. Has not received blood transfusion at this time. Wife states that she is worried that her "husband will become disoriented without getting blood." She does not understand why the wait is long and she is not able to be with her husband. She requested that Dr. B call and speak to the ER doctor to determine if the blood transfusion can be expedited.  Active listening provided. I explained to her that I would give the msg to Dr. Rogue Bussing.

## 2020-01-11 NOTE — Progress Notes (Signed)
Patient walked to the bathroom and complained on angina. He says sometimes when he walks he gets chest pain that goes away when he rests. Sometimes he takes nitro if the pain is bad. Pain subsided. Patient states that when HR is in 70s he gets chest pain, hr sustains in 40s-50s regularly. RN paged provider for PRN nitro just in case.

## 2020-01-11 NOTE — Progress Notes (Signed)
Patient placed on CPAP of 4 and O2 2.5L. Pt is tolerating it well at this moment. RT will continue to monitor.

## 2020-01-11 NOTE — Telephone Encounter (Addendum)
Returned the call to the patients wife. Pt wife sts that a message was sent by the pt GI to Dr. Fletcher Anon to update him on the patients current condition. Advised her that Dr. Fletcher Anon is out of the office this afternoon. It looks like that patients GI office has talked with the hospitalist at [facility]. A Cardiology consult was recommended. The patient needs to be admitted for low hemoglobin requiring a blood transfusion. No beds are available for a direct admit and the patient is currently in the [facility] ED.  The patients wife is distraught  and states that she feels helpless. She is very concerned about her husband. Reassured her that [facility] ED is the most appropriate place for the patient to be. Pt does have his cell with him and is able to place calls to her.  Adv Mrs. Roberston that I will fwd the update to Dr. Fletcher Anon as well who will be working at [facility] in the morning. She was very appreciative for the call back.

## 2020-01-11 NOTE — Telephone Encounter (Signed)
Patient went to GI doctor (Dr. Trellis Paganini) this morning where they checked his hemoglobin and it was 6.5. Patient was then transferred to [facility] ED. Patients wife calling in stating he was supposed to be getting an infusion at 11 am of two units of blood. Patient was told he may be in the ED for 2 days and he is having chest pain, bloody stool, extreme confusion.    Patient needs some additional help to be seen or helped sooner. Patients wife is very distraught especially about the 2 uints of blood for patient

## 2020-01-11 NOTE — Consult Note (Addendum)
Cardiology Consultation:   Patient ID: Seth Hardy MRN: [id number]; DOB: 1945-12-11  Admit date: 01/11/2020 Date of Consult: 01/11/2020  Primary Care Provider: Rusty Aus, MD [facility] Cardiologist: Kathlyn Sacramento, MD  [facility] Electrophysiologist:  Vickie Epley, MD    Patient Profile:   Seth Hardy is a 74 y.o. male with a complex PMH to include CAD s/p CABG 2011, BMS to RCA 2013, orbital atherectomy of dRCA 2019, DES to distal RCA 08/2019 at OSH, persistent atrial fibrillation (on amiodarone/Coumadin), baseline bradycardia, moderate aortic stenosis, OSA on CPAP, HTN, HLD, carotid artery disease s/p L CEA 2017, cerebrovascular disease, DM, iron deficiency anemia, gastric polyp resection, iron infusions fairly recent development of CKD stage III in 2021 who is being seen today for the evaluation of blood thinner recommendations at the request of Dr. Sloan Leiter.  History of Present Illness:   He follows with Dr. Fletcher Anon with most recent history as above. Per Dr. Tyrell Antonio note, the patient was hospitalized at [facility] in [place] in April of this year after he presented with A. fib with RVR and non-STEMI. He underwent left heart catheterization which showed significant restenosis in the distal RCA stent which was treated with PCI and adding another drug-eluting stent. He has been on Plavix + warfarin. He was rehospitalized recently at [facility] 8/2-12/28/19 with AF-RVR and converted on IV amiodarone. He was previously on amiodarone 100 mg once daily and this was increased to 200 mg twice daily for 1 week then down to 200 mg once daily. His troponin was mildly elevated felt to be due to supply demand ischemia.  Echocardiogram was repeated which showed EF 60-65%, grade 2 DD, and stable moderate aortic stenosis. He has concomitantly been known to have iron deficiency anemia currently managed by hematology, requiring episodic iron infusions. Bone marrow biopsy was to  be considered soon along with GI workup.  Dr. Fletcher Anon saw him in the office 01/09/20. At that visit he patient reported an interim episode of breakthrough atrial fib but quickly converted to NSR. He also had had an episode of exertional CP/SOB that responded to SL NTG. Dr. Fletcher Anon felt his underlying anemia was contributing to his symptoms and recommended medical therapy maximization so Imdur was added. Given his recent atrial fibrillation issues, he saw Dr. Quentin Ore who recommended treatment of afib with pharmacologic therapy which would also require placement of a pacemaker to avoid previous bradycardia issues. It appears this was tentatively planned for 01/26/20. He saw GI today and was found to have dark stool, heme positive with Hgb 6.5, macrocytic. He presented to their office in a wheelchair very symptomatic and unable to ambulate without anginal symptoms. He was advised to present to the ED. Here, troponin was checked and is negative. He is being admitted by internal medicine team with GI to follow. Cardiology also asked to assess given ongoing blood thinner therapy. INR was 5.3 yesterday, skipped warfarin, today 2.6. He is also on Plavix without ASA. Hgb 6.2 in our ED. He is receiving blood transfusion currently and reports feeling well, just hungry. He does report some episodes of rest chest pain, primarily precipitated by when his HR is over the 70s (usually running in the 50s).    Note: cannot currently update the forma PMH in Epic due to issues since overnight upgrade with EMR freezing. See history above. Past Medical History:  Diagnosis Date  . Anemia 08/2015   received 2 units rbc one week ago, 3 IV iron  infusion -last 1 week.  . Basal cell carcinoma of skin 2012   removed several spots from arms and back  . CAD (coronary artery disease)    a. 01/2010 CABG x 4: LIMA->LAD, VG->D1, VG->OM1, VG->PDA. b. NSTEMI 02/2012 in setting of AF-RVR with cath s/p BMS to RCA (plan for 1 month, possibly up to 3  months of Plavix)  . Carotid disease, bilateral ([place])    a. 03/2011 U/S: 40-59% bilat Carotid dzs;  b. 04/2015 Carotid U/S: 40-59% bilat ICA stenosis; c. 05/2015 CTA Neck: 23% RICA, 30% LICA, mod-marked R vertebral stenosis, mod L vertebral stenosis.  . Chronic kidney disease    small obtusion per pcp. ultrasound done on 08/29/2015  . Diabetes mellitus type II, controlled ([place])    a. Variable CBG 02/2012 - several meds adjusted.  Marland Kitchen Dysrhythmia    intermittent Atrial Fibrillation  . GERD (gastroesophageal reflux disease)   . Heart murmur   . Hyperlipidemia   . Hypertension   . Hypertensive heart disease   . Iron deficiency anemia    "gets infusions q once in awhile" (04/27/2018)  . Macular edema bil   lazer work done previously  . Mild aortic stenosis    a. 03/2011 Echo: EF 55-60%, Mild AS. b. Mild by cath 02/2012; c. 01/2014 Echo: EF 65-70%, Gr 1 DD, mild AS, mildly dil LA.  . Multiple gastric ulcers 2006  . Myocardial infarction ([place]) 02/2012   stents (x1) at time  . Myocardial infarction [facility])    "2nd one was after 02/2012; don't know date" (04/27/2018)  . Obesity   . On home oxygen therapy    "2.5 w/CPAP" (04/27/2018)  . OSA on CPAP    CPAP settings 3 with oxygen 2.5 (04/27/2018)  . PAF (paroxysmal atrial fibrillation) ([place])    a. Newly dx 02/2012 & initiated on Coumadin (spont converted to NSR).  . Shortness of breath dyspnea   . Transfusion history    transfusions -1 month ago -2 units    Past Surgical History:  Procedure Laterality Date  . ANGIOPLASTY    . BASAL CELL CARCINOMA EXCISION     "shoulder, arm X 2"  . CARDIAC CATHETERIZATION  2014  . CARDIAC CATHETERIZATION  2021   [facility] x1 stent  . CATARACT EXTRACTION W/ INTRAOCULAR LENS  IMPLANT, BILATERAL Bilateral 2016  . COLONOSCOPY WITH PROPOFOL N/A 10/15/2015   Procedure: COLONOSCOPY WITH PROPOFOL;  Surgeon: Gatha Mayer, MD;  Location: [facility] ENDOSCOPY;  Service: Endoscopy;  Laterality: N/A;  .  CORONARY ANGIOPLASTY     Dr. Fletcher Anon  . CORONARY ARTERY BYPASS GRAFT  2011   CABG X3-4; [place], [place], [place],   . CORONARY ATHERECTOMY  04/27/2018  . CORONARY ATHERECTOMY N/A 04/27/2018   Procedure: CORONARY ATHERECTOMY;  Surgeon: Belva Crome, MD;  Location: [place] CV LAB;  Service: Cardiovascular;  Laterality: N/A;  . cyst L kidney  [place]  . ENDARTERECTOMY Left 08/30/2015   Procedure: ENDARTERECTOMY CAROTID;  Surgeon: Katha Cabal, MD;  Location: [facility] ORS;  Service: Vascular;  Laterality: Left;  . ESOPHAGOGASTRODUODENOSCOPY (EGD) WITH PROPOFOL N/A 10/15/2015   Procedure: ESOPHAGOGASTRODUODENOSCOPY (EGD) WITH PROPOFOL;  Surgeon: Gatha Mayer, MD;  Location: [facility] ENDOSCOPY;  Service: Endoscopy;  Laterality: N/A;  . EYE SURGERY Bilateral    "has macular edema cauterized" (04/27/2018)  . INGUINAL HERNIA REPAIR Right   . LEFT HEART CATHETERIZATION WITH CORONARY ANGIOGRAM N/A 03/09/2012   Procedure: LEFT HEART CATHETERIZATION WITH  CORONARY ANGIOGRAM;  Surgeon: Wellington Hampshire, MD;  Location: [facility] CATH LAB;  Service: Cardiovascular;  Laterality: N/A;  . PERCUTANEOUS CORONARY STENT INTERVENTION (PCI-S)  03/09/2012   Procedure: PERCUTANEOUS CORONARY STENT INTERVENTION (PCI-S);  Surgeon: Wellington Hampshire, MD;  Location: [facility] CATH LAB;  Service: Cardiovascular;;  . RIGHT/LEFT HEART CATH AND CORONARY ANGIOGRAPHY N/A 04/25/2018   Procedure: RIGHT/LEFT HEART CATH AND CORONARY ANGIOGRAPHY;  Surgeon: Wellington Hampshire, MD;  Location: [place] CV LAB;  Service: Cardiovascular;  Laterality: N/A;     Home Medications:  Prior to Admission medications   Medication Sig Start Date End Date Taking? Authorizing Provider  ALPRAZolam Duanne Moron) 0.25 MG tablet Take 0.25 mg by mouth daily as needed for anxiety.   Yes [provider]  amiodarone (PACERONE) 200 MG tablet Take 1 tablet (200 mg total) by mouth 2 (two) times daily for 6 days. Then take one tab po daily. Patient taking  differently: Take 200 mg by mouth every evening.  12/28/19 01/11/20 Yes Para Skeans, MD  amLODipine (NORVASC) 5 MG tablet Take one tablet by mouth one time daily 07/25/14  Yes Wellington Hampshire, MD  clopidogrel (PLAVIX) 75 MG tablet TAKE 1 TABLET BY MOUTH DAILY 07/19/19  Yes Wellington Hampshire, MD  Cyanocobalamin (B-12) 1000 MCG CAPS Take 1,000 mcg by mouth every evening.    Yes [provider]  Ferrous Gluconate-C-Folic Acid (IRON-C PO) Take 1 tablet by mouth daily.   Yes [provider]  furosemide (LASIX) 20 MG tablet Take 20 mg by mouth daily as needed for edema.   Yes [provider]  glimepiride (AMARYL) 4 MG tablet Take 4 mg by mouth daily.    Yes [provider]  insulin glargine (LANTUS) 100 UNIT/ML injection Inject 15 Units into the skin daily.    Yes [provider]  isosorbide mononitrate (IMDUR) 30 MG 24 hr tablet Take 1 tablet (30 mg total) by mouth daily. 01/09/20 04/08/20 Yes Wellington Hampshire, MD  NOVOLOG FLEXPEN 100 UNIT/ML FlexPen Inject 6-36 Units into the skin 2 (two) times daily with a meal.  05/26/17  Yes [provider]  Omega-3 Fatty Acids (FISH OIL) 1000 MG CAPS Take 1,000 mg by mouth every evening.   Yes [provider]  OXYGEN Inhale 2.5 L/min into the lungs at bedtime. With CPAP   Yes [provider]  pantoprazole (PROTONIX) 40 MG tablet Take 1 tablet (40 mg total) by mouth daily. 12/28/19 01/27/20 Yes Para Skeans, MD  Polyethylene Glycol 400 (BLINK TEARS OP) Place 1 drop into both eyes daily as needed (dry eyes).   Yes [provider]  ramipril (ALTACE) 10 MG capsule Take 10 mg by mouth daily.  09/17/15  Yes [provider]  rosuvastatin (CRESTOR) 40 MG tablet TAKE ONE TABLET BY MOUTH EVERY DAY 06/05/19  Yes Wellington Hampshire, MD  sucralfate (CARAFATE) 1 g tablet Take 1 g by mouth 2 (two) times daily. 08/23/19 08/22/20 Yes [provider]  vitamin C (ASCORBIC ACID) 500 MG tablet Take  500 mg by mouth every evening.   Yes [provider]  warfarin (COUMADIN) 5 MG tablet Take 2.5 mg by mouth daily.  07/17/16  Yes [provider]  tadalafil (CIALIS) 10 MG tablet Take 10 mg by mouth as needed. 08/23/19 09/22/19  [provider]    Inpatient Medications: Scheduled Meds:  Continuous Infusions: . pantoprozole (PROTONIX) infusion 8 mg/hr (01/11/20 1545)   PRN Meds:   Allergies:  No Known Allergies  Social History:   Social History   Socioeconomic History  . Marital status: Married    Spouse name: Not on file  . Number of children: 2  . Years of education: Not on file  . Highest education level: Not on file  Occupational History  . Occupation: retired  Tobacco Use  . Smoking status: Never Smoker  . Smokeless tobacco: Never Used  . Tobacco comment: tobacco use - no.no passive smoke in home  Vaping Use  . Vaping Use: Never used  Substance and Sexual Activity  . Alcohol use: Yes    Comment: 04/27/2018 "couple drinks/year; if that"  . Drug use: Never  . Sexual activity: Not Currently  Other Topics Concern  . Not on file  Social History Narrative   Full time. Gets regular exercise. Lives in [place] with his wife.  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   Social Determinants of Radio broadcast assistant Strain:   . Difficulty of Paying Living Expenses: Not on file  Food Insecurity:   . Worried About Charity fundraiser in the Last Year: Not on file  . Ran Out of Food in the Last Year: Not on file  Transportation Needs:   . Lack of Transportation (Medical): Not on file  . Lack of Transportation (Non-Medical): Not on file  Physical Activity:   . Days of Exercise per Week: Not on file  . Minutes of Exercise per Session: Not on file  Stress:   . Feeling of Stress : Not on file  Social Connections:   . Frequency of Communication with Friends and Family: Not on file  . Frequency of Social Gatherings with Friends and Family: Not on  file  . Attends Religious Services: Not on file  . Active Member of Clubs or Organizations: Not on file  . Attends Archivist Meetings: Not on file  . Marital Status: Not on file  Intimate Partner Violence:   . Fear of Current or Ex-Partner: Not on file  . Emotionally Abused: Not on file  . Physically Abused: Not on file  . Sexually Abused: Not on file    Family History:    Family History  Problem Relation Age of Onset  . COPD Mother        alive 12  . Bladder Cancer Mother   . Heart attack Father        07-20-2013 deceased  . Lung cancer Maternal Aunt   . Liver cancer Maternal Aunt   . Kidney disease Paternal Uncle   . Cancer Other        all paternal aunts and uncles  . Prostate cancer Neg Hx   . Kidney cancer Neg Hx      ROS:  Please see the history of present illness.  All other ROS reviewed and negative.     Physical Exam/Data:   Vitals:   01/11/20 1615 01/11/20 1630 01/11/20 1645 01/11/20 1700  BP: (!) 126/47 (!) 128/46 (!) 129/54 (!) 136/52  Pulse: (!) 50 (!) 49 (!) 51 (!) 52  Resp: _0 Temp:      TempSrc:      SpO2: 91% 92% 96% 96%  Weight:      Height:        Intake/Output Summary (Last 24 hours) at 01/11/2020 1715 Last data filed at 01/11/2020 1549 Gross per 24 hour  Intake 335 ml  Output --  Net 335 ml   Last 3 Weights 01/11/2020  01/11/2020 01/10/2020  Weight (lbs) 180 lb 180 lb 182 lb  Weight (kg) 81.647 kg 81.647 kg 82.555 kg     Body mass index is 27.37 kg/m.   General: Well developed but pale WM in no acute distress. Head: Normocephalic, atraumatic, sclera non-icteric, no xanthomas, nares are without discharge. Neck: Negative for carotid bruits. JVP not elevated. Lungs: Clear bilaterally to auscultation without wheezes, rales, or rhonchi. Breathing is unlabored. Heart: RRR, soft SEM RUSB, no rubs or gallops.  Abdomen: Soft, non-tender, non-distended with normoactive bowel sounds. No rebound/guarding. Extremities: No clubbing or  cyanosis. No edema. Distal pedal pulses are 2+ and equal bilaterally. Neuro: Alert and oriented X 3. Moves all extremities spontaneously. Psych:  Responds to questions appropriately with a normal affect.  EKG:  The EKG was personally reviewed and demonstrates: SB 51bpm, marked TWI inferiorly V3-V6, QTc 446m, appears near baseline for pt  Telemetry:  Telemetry was personally reviewed and demonstrates: N/A - just arrived to floor  Relevant CV Studies: 2D Echo 12/26/19  1. Left ventricular ejection fraction, by estimation, is 60 to 65%. The  left ventricle has normal function. Left ventricular endocardial border  not optimally defined to evaluate regional wall motion. There is moderate  left ventricular hypertrophy, with  severe focal basal hypertrophy of the septum. Left ventricular diastolic  parameters are consistent with Grade II diastolic dysfunction  (pseudonormalization). Elevated left atrial pressure.  2. Right ventricular systolic function is mildly reduced. The right  ventricular size is normal. Tricuspid regurgitation signal is inadequate  for assessing PA pressure.  3. Left atrial size was mildly dilated.  4. The mitral valve is grossly normal. No evidence of mitral valve  regurgitation. No evidence of mitral stenosis.  5. The aortic valve is tricuspid. Aortic valve regurgitation is not  visualized. Mild to moderate aortic valve stenosis, though degree of  stenosis may be underestimated due to poor Doppler envelopes.   Laboratory Data:  High Sensitivity Troponin:   Recent Labs  Lab 12/26/19 0126 12/26/19 0715 12/26/19 1738 12/27/19 0008 01/11/20 1524  TROPONINIHS 102* 754* 809* 499* 13     Chemistry Recent Labs  Lab 01/08/20 1252 01/11/20 1136  NA 135 135  K 4.8 4.4  CL 104 103  CO2 23 23  GLUCOSE 229* 219*  BUN 25* 16  CREATININE 1.59* 1.49*  CALCIUM 8.3* 8.5*  GFRNONAA 42* 46*  GFRAA 49* 53*  ANIONGAP 8 9    Recent Labs  Lab 01/11/20 1136    PROT 5.3*  ALBUMIN 3.1*  AST 22  ALT 27  ALKPHOS 58  BILITOT 0.5   Hematology Recent Labs  Lab 01/08/20 1252 01/11/20 0925 01/11/20 1136  WBC 4.2 5.0 4.8  RBC 2.15* 1.89 aL* 1.86*  HGB 7.2* 6.5 Repeated and verified X2.* 6.2*  HCT 22.4* 19.9 cL* 21.4*  MCV 104.2* 104.9* 115.1*  MCH 33.5  --  33.3  MCHC 32.1 32.7 29.0*  RDW 16.2* 17.9* 18.0*  PLT 233 216.0 219    Radiology/Studies:  No results found. { Assessment and Plan:   1. Symptomatic anemia with probable GI bleed 2. CAD with h/o CABG 2011 and subsequent PCIs, most recently 08/2019 3. Moderate aortic stenosis 4. Persistent atrial fibrillation with underlying baseline bradycardia, recent exacerbations - CHADSVASC 4 5. CKD stage III by labs 6. Carotid artery disease 7. Diabetes mellitus  Cardiology is asked to weigh in on patient's symptomatic GIB with regard to pre-procedural clearance for endoscopic evaluation as well as antiplatelet/anticoagulant therapy. Plavix and  wafarin are currently on hold. Discussed preliminarily with Dr. Marlou Porch. Patient is at elevated baseline risk for procedural complications, however, at this time, the risk of not proceeding with further workup is undoubtably higher than delaying workup. Obviously this is a difficult situation for his blood thinner therapy as he has strong indications for such - but he presents with severe anemia this admission. He had recently chemically cardioverted but did not have any recent electrical cardioversion so agree with holding warfarin allowing INR to downtrend. Holding Plavix is not ideal given recent PCI but risk of life threatening GIB and anemia outweighs benefit of acutely continuing. The hope is that since this was likely a later generation stent and completion of 3 months of therapy that he would be at lower risk of acute stent thrombosis, but will need to follow carefully for any acute changes in cardiovascular status. EKG shows chronic STTW changes. Initial  troponin was checked and was negative; would not pursue repeat draw as it depletes further blood count and does not change our plan for medical management at this time. See below for further thoughts.  For questions or updates, please contact Eva Please consult [email or web address] for contact info under    Signed, Charlie Pitter, PA-C  01/11/2020 5:15 PM   Personally seen and examined. Agree with above.   74 year old here with GI bleed.  Currently fairly comfortable in bed.  No active chest pain no shortness of breath.  Blood is running/transfusion.  He does state that over the last several weeks he has had some exertional stable anginal symptoms.  Antianginal medications have been utilized.  This may have been worsened recently by anemia.  Denies any adverse arrhythmias, no syncope no fevers no chills.  GEN: Well nourished, well developed, in no acute distress  HEENT: normal  Neck: no JVD, carotid bruits, or masses Cardiac: RRR; 2/6 systolic murmur, no rubs, or gallops,no edema  Respiratory:  clear to auscultation bilaterally, normal work of breathing GI: soft, nontender, nondistended, + BS MS: no deformity or atrophy  Skin: warm and dry, no rash, pale appearing Neuro:  Alert and Oriented x 3, Strength and sensation are intact Psych: euthymic mood, full affect  Lab work as above reviewed.  Prior echocardiogram, other data personally reviewed.  Assessment and plan:  GI bleed CAD post CABG  -Obviously in the setting, Coumadin, Plavix both need to be held. -Thankfully, his next generation stent was placed in August, therefore he has had over 3 months of antiplatelet therapy.  According to current literature, this may be adequate to help reduce the risk of stent thrombosis.  Discussed with patient.  Further discussion must be had about restarting both Plavix and Coumadin.  Perhaps at this point given greater than 3 months out from last stent, we could consider Coumadin  monotherapy after he resolves from this GI bleed episode. -If endoscopy is required, which I believe it will be, he may proceed with moderate overall cardiac risk. -Continue with isosorbide, Crestor 40, long-term amiodarone.  Has paroxysmal atrial fibrillation.  I am comfortable with him holding his Coumadin at this time given his GI bleed.  Small risk of potential stroke over this short duration of time.  We will follow along.  Candee Furbish, MD

## 2020-01-12 ENCOUNTER — Other Ambulatory Visit: Payer: PPO

## 2020-01-12 ENCOUNTER — Telehealth: Payer: Self-pay | Admitting: Physician Assistant

## 2020-01-12 ENCOUNTER — Ambulatory Visit: Payer: PPO | Admitting: Internal Medicine

## 2020-01-12 ENCOUNTER — Inpatient Hospital Stay (HOSPITAL_COMMUNITY): Payer: PPO | Admitting: Certified Registered"

## 2020-01-12 ENCOUNTER — Encounter (HOSPITAL_COMMUNITY)
Admission: EM | Disposition: A | Discharge status: Home / Self Care | Payer: Self-pay | Source: Home / Self Care | Attending: Internal Medicine

## 2020-01-12 ENCOUNTER — Encounter (HOSPITAL_COMMUNITY): Payer: Self-pay | Admitting: Internal Medicine

## 2020-01-12 ENCOUNTER — Ambulatory Visit: Payer: PPO

## 2020-01-12 DIAGNOSIS — E782 Mixed hyperlipidemia: Secondary | ICD-10-CM

## 2020-01-12 DIAGNOSIS — D500 Iron deficiency anemia secondary to blood loss (chronic): Secondary | ICD-10-CM

## 2020-01-12 DIAGNOSIS — Z7901 Long term (current) use of anticoagulants: Secondary | ICD-10-CM

## 2020-01-12 DIAGNOSIS — R195 Other fecal abnormalities: Secondary | ICD-10-CM

## 2020-01-12 DIAGNOSIS — K31819 Angiodysplasia of stomach and duodenum without bleeding: Secondary | ICD-10-CM

## 2020-01-12 DIAGNOSIS — K317 Polyp of stomach and duodenum: Secondary | ICD-10-CM

## 2020-01-12 DIAGNOSIS — I350 Nonrheumatic aortic (valve) stenosis: Secondary | ICD-10-CM

## 2020-01-12 DIAGNOSIS — K921 Melena: Secondary | ICD-10-CM

## 2020-01-12 DIAGNOSIS — I480 Paroxysmal atrial fibrillation: Secondary | ICD-10-CM

## 2020-01-12 DIAGNOSIS — K31811 Angiodysplasia of stomach and duodenum with bleeding: Principal | ICD-10-CM

## 2020-01-12 HISTORY — PX: ESOPHAGOGASTRODUODENOSCOPY (EGD) WITH PROPOFOL: SHX5813

## 2020-01-12 HISTORY — PX: HOT HEMOSTASIS: SHX5433

## 2020-01-12 LAB — PROTIME-INR
INR: 2 — ABNORMAL HIGH (ref 0.8–1.2)
Prothrombin Time: 21.8 seconds — ABNORMAL HIGH (ref 11.4–15.2)

## 2020-01-12 LAB — BASIC METABOLIC PANEL
Anion gap: 7 (ref 5–15)
BUN: 11 mg/dL (ref 8–23)
CO2: 23 mmol/L (ref 22–32)
Calcium: 8.4 mg/dL — ABNORMAL LOW (ref 8.9–10.3)
Chloride: 107 mmol/L (ref 98–111)
Creatinine, Ser: 1.32 mg/dL — ABNORMAL HIGH (ref 0.61–1.24)
GFR calc Af Amer: >60 mL/min (ref >60)
GFR calc non Af Amer: 53 mL/min — ABNORMAL LOW (ref >60)
Glucose, Bld: 106 mg/dL — ABNORMAL HIGH (ref 70–99)
Potassium: 4.2 mmol/L (ref 3.5–5.1)
Sodium: 137 mmol/L (ref 135–145)

## 2020-01-12 LAB — TYPE AND SCREEN
ABO/RH(D): O POS
ABO/RH(D): O POS
Antibody Screen: NEGATIVE
Antibody Screen: NEGATIVE
Unit division: 0
Unit division: 0
Unit division: 0

## 2020-01-12 LAB — CBC
HCT: 28.7 % — ABNORMAL LOW (ref 39.0–52.0)
HCT: 32.4 % — ABNORMAL LOW (ref 39.0–52.0)
Hemoglobin: 10.1 g/dL — ABNORMAL LOW (ref 13.0–17.0)
Hemoglobin: 9.1 g/dL — ABNORMAL LOW (ref 13.0–17.0)
MCH: 31.8 pg (ref 26.0–34.0)
MCH: 31.9 pg (ref 26.0–34.0)
MCHC: 31.2 g/dL (ref 30.0–36.0)
MCHC: 31.7 g/dL (ref 30.0–36.0)
MCV: 100.7 fL — ABNORMAL HIGH (ref 80.0–100.0)
MCV: 101.9 fL — ABNORMAL HIGH (ref 80.0–100.0)
Platelets: 212 10*3/uL (ref 150–400)
Platelets: 214 10*3/uL (ref 150–400)
RBC: 2.85 MIL/uL — ABNORMAL LOW (ref 4.22–5.81)
RBC: 3.18 MIL/uL — ABNORMAL LOW (ref 4.22–5.81)
RDW: 19.9 % — ABNORMAL HIGH (ref 11.5–15.5)
RDW: 20.8 % — ABNORMAL HIGH (ref 11.5–15.5)
WBC: 4.5 10*3/uL (ref 4.0–10.5)
WBC: 5 10*3/uL (ref 4.0–10.5)
nRBC: 0 % (ref 0.0–0.2)
nRBC: 0 % (ref 0.0–0.2)

## 2020-01-12 LAB — GLUCOSE, CAPILLARY
Glucose-Capillary: 103 mg/dL — ABNORMAL HIGH (ref 70–99)
Glucose-Capillary: 157 mg/dL — ABNORMAL HIGH (ref 70–99)
Glucose-Capillary: 281 mg/dL — ABNORMAL HIGH (ref 70–99)
Glucose-Capillary: 294 mg/dL — ABNORMAL HIGH (ref 70–99)

## 2020-01-12 LAB — BPAM RBC
Blood Product Expiration Date: 202109112359
Blood Product Expiration Date: 202109192359
Blood Product Expiration Date: 202109192359
ISSUE DATE / TIME: 202108191538
ISSUE DATE / TIME: 202108191821
Unit Type and Rh: 5100
Unit Type and Rh: 5100
Unit Type and Rh: 5100

## 2020-01-12 LAB — PREPARE RBC (CROSSMATCH)

## 2020-01-12 SURGERY — ESOPHAGOGASTRODUODENOSCOPY (EGD) WITH PROPOFOL
Anesthesia: Monitor Anesthesia Care

## 2020-01-12 MED ORDER — PROPOFOL 500 MG/50ML IV EMUL
INTRAVENOUS | Status: DC | PRN
  Administered 2020-01-12: 125 ug/kg/min via INTRAVENOUS

## 2020-01-12 MED ORDER — SODIUM CHLORIDE 0.9 % IV SOLN: INTRAVENOUS | Status: DC

## 2020-01-12 MED ORDER — PANTOPRAZOLE SODIUM 40 MG PO TBEC
40.0000 mg | DELAYED_RELEASE_TABLET | Freq: Two times a day (BID) | ORAL | Status: DC
  Administered 2020-01-12 – 2020-01-16 (×7): 40 mg via ORAL
  Filled 2020-01-12 (×8): qty 1

## 2020-01-12 MED ORDER — AMLODIPINE BESYLATE 5 MG PO TABS
5.0000 mg | ORAL_TABLET | Freq: Every day | ORAL | Status: DC
  Administered 2020-01-12 – 2020-01-16 (×5): 5 mg via ORAL
  Filled 2020-01-12 (×5): qty 1

## 2020-01-12 MED ORDER — LACTATED RINGERS IV SOLN: INTRAVENOUS | Status: DC | PRN

## 2020-01-12 MED ORDER — INSULIN GLARGINE 100 UNIT/ML ~~LOC~~ SOLN
15.0000 [IU] | Freq: Every day | SUBCUTANEOUS | Status: DC
  Administered 2020-01-12 – 2020-01-15 (×4): 15 [IU] via SUBCUTANEOUS
  Filled 2020-01-12 (×5): qty 0.15

## 2020-01-12 MED ORDER — INSULIN GLARGINE 100 UNIT/ML ~~LOC~~ SOLN: 15.0000 [IU] | Freq: Every day | SUBCUTANEOUS | Status: DC

## 2020-01-12 MED ORDER — RAMIPRIL 2.5 MG PO CAPS
10.0000 mg | ORAL_CAPSULE | Freq: Every day | ORAL | Status: DC
  Administered 2020-01-12 – 2020-01-16 (×5): 10 mg via ORAL
  Filled 2020-01-12 (×5): qty 4

## 2020-01-12 MED ORDER — LIDOCAINE HCL (CARDIAC) PF 100 MG/5ML IV SOSY
PREFILLED_SYRINGE | INTRAVENOUS | Status: DC | PRN
  Administered 2020-01-12: 40 mg via INTRAVENOUS

## 2020-01-12 MED ORDER — PROPOFOL 10 MG/ML IV BOLUS
INTRAVENOUS | Status: DC | PRN
  Administered 2020-01-12: 20 mg via INTRAVENOUS

## 2020-01-12 SURGICAL SUPPLY — 15 items

## 2020-01-12 NOTE — Progress Notes (Signed)
Agree with the assessment and plan as outlined by Nicoletta Ba, PA-C. Agree with plan for transfer to hospital for admission and expedited inpatient w/u.   Latayna Ritchie, DO, [facility]

## 2020-01-12 NOTE — Anesthesia Postprocedure Evaluation (Signed)
Anesthesia Post Note  Patient: Seth Hardy  Procedure(s) Performed: ESOPHAGOGASTRODUODENOSCOPY (EGD) WITH PROPOFOL (N/A ) HOT HEMOSTASIS (ARGON PLASMA COAGULATION/BICAP) (N/A )     Patient location during evaluation: PACU Anesthesia Type: MAC Level of consciousness: awake and alert and oriented Pain management: pain level controlled Vital Signs Assessment: post-procedure vital signs reviewed and stable Respiratory status: spontaneous breathing, nonlabored ventilation and respiratory function stable Cardiovascular status: stable and blood pressure returned to baseline Postop Assessment: no apparent nausea or vomiting Anesthetic complications: no   No complications documented.  Last Vitals:  Vitals:   01/12/20 1340 01/12/20 1350  BP: (!) 126/37 (!) 140/35  Pulse: (!) 42 (!) 44  Resp: 15 18  Temp:    SpO2: 95% 98%    Last Pain:  Vitals:   01/12/20 1350  TempSrc:   PainSc: 0-No pain                 [name] A.

## 2020-01-12 NOTE — Telephone Encounter (Signed)
Noted  

## 2020-01-12 NOTE — Telephone Encounter (Signed)
I reviewed his chart and it seems that he is getting excellent care at [facility].  He was seen by Dr. Marlou Porch on my behalf.

## 2020-01-12 NOTE — Anesthesia Preprocedure Evaluation (Addendum)
Anesthesia Evaluation  Patient identified by MRN, date of birth, ID band Patient awake    Reviewed: Allergy & Precautions, NPO status , Patient's Chart, lab work & pertinent test results, reviewed documented beta blocker date and time   Airway Mallampati: II  TM Distance: >3 FB Neck ROM: Full    Dental  (+) Caps, Chipped   Pulmonary shortness of breath, with exertion, at rest and Long-Term Oxygen Therapy, sleep apnea and Continuous Positive Airway Pressure Ventilation ,    breath sounds clear to auscultation + decreased breath sounds      Cardiovascular hypertension, Pt. on medications and Pt. on home beta blockers + angina with exertion + CAD, + Past MI, + Cardiac Stents and + CABG  + dysrhythmias Atrial Fibrillation + Valvular Problems/Murmurs AS  Rhythm:Irregular Rate:Normal + Systolic murmurs Hx/o Anterior MI 01/2010 DES x 1 RCA 02/2012 Mild AS   Neuro/Psych negative neurological ROS  negative psych ROS   GI/Hepatic Neg liver ROS, PUD, GERD  Medicated and Controlled,Heme + stools   Endo/Other  diabetes, Well Controlled, Type 2, Oral Hypoglycemic Agents, Insulin DependentHyperlipidemia  Renal/GU Renal InsufficiencyRenal disease   ED    Musculoskeletal negative musculoskeletal ROS (+)   Abdominal   Peds  Hematology  (+) anemia , IDA Plavix -last dose 8/19 Coumadin- last dose 8/17   Anesthesia Other Findings   Reproductive/Obstetrics                            Anesthesia Physical Anesthesia Plan  ASA: III  Anesthesia Plan: MAC   Post-op Pain Management:    Induction: Intravenous  PONV Risk Score and Plan: 1 and Propofol infusion and Treatment may vary due to age or medical condition  Airway Management Planned: Natural Airway, Nasal Cannula and Simple Face Mask  Additional Equipment:   Intra-op Plan:   Post-operative Plan:   Informed Consent: I have reviewed the patients  History and Physical, chart, labs and discussed the procedure including the risks, benefits and alternatives for the proposed anesthesia with the patient or authorized representative who has indicated his/her understanding and acceptance.     Dental advisory given  Plan Discussed with: CRNA and Anesthesiologist  Anesthesia Plan Comments:         Anesthesia Quick Evaluation

## 2020-01-12 NOTE — Op Note (Signed)
[facility] Patient Name: Seth Hardy Procedure Date : 01/12/2020 MRN: [id number] Attending MD: Jerene Bears , MD Date of Birth: [birth year] CSN: [id number] Age: 74 Admit Type: Inpatient Procedure:                Upper GI endoscopy Indications:              Iron deficiency anemia, Melenic stools Providers:                Lajuan Lines. Hilarie Fredrickson, MD, Doristine Johns, RN, Laverda Sorenson, Technician, Phill Myron. Child psychotherapist, CRNA Referring MD:             [facility] Medicines:                Monitored Anesthesia Care Complications:            No immediate complications. Estimated Blood Loss:     Estimated blood loss: none. Procedure:                Pre-Anesthesia Assessment:                           - Prior to the procedure, a History and Physical                            was performed, and patient medications and                            allergies were reviewed. [name] tolerance of                            previous anesthesia was also reviewed. The risks                            and benefits of the procedure and the sedation                            options and risks were discussed with the patient.                            All questions were answered, and informed consent                            was obtained. Prior Anticoagulants: The patient has                            taken Coumadin (warfarin), last dose was 2 days                            prior to procedure. ASA Grade Assessment: III - A                            patient with severe systemic disease. After  reviewing the risks and benefits, the patient was                            deemed in satisfactory condition to undergo the                            procedure.                           After obtaining informed consent, the endoscope was                            passed under direct vision. Throughout the                             procedure, the patient's blood pressure, pulse, and                            oxygen saturations were monitored continuously. The                            [id number]-H190 ([id number]) Olympus gastroscope was                            introduced through the mouth, and advanced to the                            second part of duodenum. Scope In: Scope Out: Findings:      The examined esophagus was normal.      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 There       was a spot of active bleeding in the pyloric region just proximal to the       pyloric channel. Fulguration to stop the bleeding by argon plasma at 1       liter/minute and 20 watts was successful. The remaining GAVE was not       treated today with APC given INR 2.0 and recent Plavix use.      Two 5 to 8 mm semi-sessile polyps with no bleeding and no stigmata of       recent bleeding were found in the cardia and on the greater curvature of       the gastric body. Polypectomy was not attempted due to recent warfarin       and Plavix use. These have the appearance of hyperplastic gastric polyps.      The examined duodenum was normal. Impression:               - Normal esophagus.                           - Gastric antral vascular ectasia with bleeding.                            Treated with argon plasma coagulation (APC) with  cessation of bleeding.                           - Two gastric polyps. Resection not attempted due                            to recent clopidogrel and warfarin.                           - Normal examined duodenum.                           - No specimens collected. Moderate Sedation:      N/A Recommendation:           - Return patient to hospital ward for ongoing care.                           - Resume previous diet.                           - Continue present medications. Would  continue to                            hold Plavix (per cardiology this does not need to                            be resumed) and warfarin.                           - Monitor Hgb. He has recently received IV iron.                           - Repeat upper endoscopy is recommended in the                            future to completely treat GAVE and consider                            gastric polypectomy given the need to resume                            chronic anticoagulation. 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 Repeat EGD can be performed                            in the outpatient hospital setting or if he remains                            hospitalized could be considered on 01/15/20 (5 days                            after last Plavix administration and INR should be  normal by then as well). 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 All rights reserved. The codes documented in this report are preliminary and upon coder review may  be revised to meet current compliance requirements. Jerene Bears, MD 01/12/2020 1:50:02 PM This report has been signed electronically. Number of Addenda: 0

## 2020-01-12 NOTE — Telephone Encounter (Signed)
Pt's wife is calling wanting to let Amy Trellis Paganini know that she is very appreciative of Amy's efforts to admit the pt in the hospital when he needed the care the most.  CB [phone]

## 2020-01-12 NOTE — Transfer of Care (Signed)
Immediate Anesthesia Transfer of Care Note  Patient: Seth Hardy  Procedure(s) Performed: ESOPHAGOGASTRODUODENOSCOPY (EGD) WITH PROPOFOL (N/A ) HOT HEMOSTASIS (ARGON PLASMA COAGULATION/BICAP) (N/A )  Patient Location: Endoscopy Unit  Anesthesia Type:MAC  Level of Consciousness: drowsy and patient cooperative  Airway & Oxygen Therapy: Patient Spontanous Breathing and Patient connected to nasal cannula oxygen  Post-op Assessment: Report given to RN, Post -op Vital signs reviewed and stable and Patient moving all extremities X 4  Post vital signs: Reviewed and stable  Last Vitals:  Vitals Value Taken Time  BP 102/29 01/12/20 1330  Temp    Pulse 39 01/12/20 1331  Resp 14 01/12/20 1331  SpO2 96 % 01/12/20 1331  Vitals shown include unvalidated device data.  Last Pain:  Vitals:   01/12/20 1223  TempSrc: Oral  PainSc: 0-No pain         Complications: No complications documented.

## 2020-01-12 NOTE — Progress Notes (Signed)
Progress Note  Patient Name: Seth Hardy Date of Encounter: 01/12/2020  [facility] Cardiologist: Kathlyn Sacramento, MD   Subjective   When walking to the bathroom yesterday evening he did complain of some discomfort.  He says chest pain went away when resting.  Occasionally he will take nitroglycerin at home.  Pain subsided while he is here.  Remember, he is on isosorbide for chronic stable angina.  Also, question possibility of upper GI source for pain.  Inpatient Medications    Scheduled Meds: . amiodarone  200 mg Oral QPM  . vitamin C  500 mg Oral QPM  . insulin aspart  0-9 Units Subcutaneous TID WC  . insulin glargine  10 Units Subcutaneous Daily  . isosorbide mononitrate  30 mg Oral Daily  . omega-3 acid ethyl esters  1 g Oral QHS  . rosuvastatin  40 mg Oral q1800  . sodium chloride flush  3 mL Intravenous Q12H  . sodium chloride flush  3 mL Intravenous Q12H  . sucralfate  1 g Oral BID   Continuous Infusions: . sodium chloride    . pantoprozole (PROTONIX) infusion 8 mg/hr (01/12/20 0312)   PRN Meds: sodium chloride, acetaminophen **OR** acetaminophen, albuterol, ALPRAZolam, nitroGLYCERIN, ondansetron **OR** ondansetron (ZOFRAN) IV, polyvinyl alcohol, sodium chloride flush   Vital Signs    Vitals:   01/11/20 2314 01/12/20 0034 01/12/20 0456 01/12/20 0725  BP:  (!) 125/53 (!) 141/51 (!) 149/57  Pulse: (!) 51 (!) 45 (!) 46 (!) 50  Resp: 20 20 18 17   Temp:  98.5 F (36.9 C) 98.5 F (36.9 C) 98.3 F (36.8 C)  TempSrc:  Oral Oral Oral  SpO2: 97% 94% 94% 97%  Weight:   82.7 kg   Height:        Intake/Output Summary (Last 24 hours) at 01/12/2020 0808 Last data filed at 01/12/2020 0509 Gross per 24 hour  Intake 1852.39 ml  Output 1300 ml  Net 552.39 ml   Last 3 Weights 01/12/2020 01/11/2020 01/11/2020  Weight (lbs) 182 lb 4.8 oz 184 lb 11.9 oz 180 lb  Weight (kg) 82.691 kg 83.8 kg 81.647 kg      Telemetry    No significant pauses, sinus rhythm 50-  Personally Reviewed  ECG    01/11/2020-sinus bradycardia rate 51 with T wave inversions fairly diffusely, no change from prior- Personally Reviewed  Physical Exam   GEN: No acute distress.   Neck: No JVD Cardiac:  Bradycardic regular, 2/6 systolic murmur, rubs, or gallops.  Respiratory: Clear to auscultation bilaterally. GI: Soft, nontender, non-distended  MS: No edema; No deformity. Neuro:  Nonfocal  Psych: Normal affect   Labs    High Sensitivity Troponin:   Recent Labs  Lab 12/26/19 0126 12/26/19 0715 12/26/19 1738 12/27/19 0008 01/11/20 1524  TROPONINIHS 102* 754* 809* 499* 13      Chemistry Recent Labs  Lab 01/08/20 1252 01/11/20 1136  NA 135 135  K 4.8 4.4  CL 104 103  CO2 23 23  GLUCOSE 229* 219*  BUN 25* 16  CREATININE 1.59* 1.49*  CALCIUM 8.3* 8.5*  PROT  --  5.3*  ALBUMIN  --  3.1*  AST  --  22  ALT  --  27  ALKPHOS  --  58  BILITOT  --  0.5  GFRNONAA 42* 46*  GFRAA 49* 53*  ANIONGAP 8 9     Hematology Recent Labs  Lab 01/08/20 1252 01/08/20 1252 01/11/20 0925 01/11/20 1136 01/11/20 2339  WBC  4.2   < > 5.0 4.8 4.5  RBC 2.15*   < > 1.89 aL* 1.86* 2.85*  HGB 7.2*   < > 6.5 Repeated and verified X2.* 6.2* 9.1*  HCT 22.4*   < > 19.9 cL* 21.4* 28.7*  MCV 104.2*   < > 104.9* 115.1* 100.7*  MCH 33.5  --   --  33.3 31.9  MCHC 32.1   < > 32.7 29.0* 31.7  RDW 16.2*   < > 17.9* 18.0* 19.9*  PLT 233   < > 216.0 219 212   < > = values in this interval not displayed.    BNPNo results for input(s): BNP, PROBNP in the last 168 hours.   DDimer No results for input(s): DDIMER in the last 168 hours.   Radiology    No results found.  Cardiac Studies   Echocardiogram 12/26/2019-EF 58% grade 2 diastolic dysfunction mildly dilated left atrium mild to moderate aortic valve stenosis.  Patient Profile     74 y.o. male here with recurrent upper GI bleed, CAD post CABG, recent stent placement in April 2021.  Assessment & Plan    GI  bleed-hemoglobin 6.2 on arrival CAD post CABG recent stent August 2021 Paroxysmal atrial fibrillation on amiodarone, Coumadin Mild to moderate aortic valve stenosis Sinus bradycardia-stable  -Okay to proceed with endoscopy with moderate overall cardiac risk.  Has stable angina at home, taking isosorbide for instance.  No evidence of acute coronary syndrome currently.  On Protonix IV. -Obviously in the setting of GI bleed, both Coumadin and Plavix have been held. -Since he is greater than 3 months out from recent new generation stent placement, I would encourage Coumadin monotherapy from this point forward to help reduce overall bleeding risk.  In other words, I would not restart Plavix. -Continue with isosorbide Crestor long-term amiodarone for his paroxysmal atrial fibrillation.  Resume Coumadin when able from a GI perspective.  Small potential stroke risk over short duration of time while holding Coumadin. -He is also seen Dr. Quentin Ore with EP as an outpatient who recommended treatment of A. fib with pharmacologic therapy which could require pacemaker to avoid previous bradycardic issues. -Aortic stenosis should be of no clinical relevance at this point.  We will follow along.      For questions or updates, please contact [place] Please consult [email or web address] for contact info under        Signed, Candee Furbish, MD  01/12/2020, 8:08 AM

## 2020-01-12 NOTE — Progress Notes (Signed)
Patient refused to wear CPAP tonight. RT will continue to monitor.

## 2020-01-12 NOTE — Progress Notes (Signed)
    Progress Note   Subjective  Patient feeling better after blood transfusions No abdominal pain, chest pain or dyspnea at present Warfarin is held, Plavix is held  He did receive IV iron earlier this week in [place]   Objective  Vital signs in last 24 hours: Temp:  [98.1 F (36.7 C)-99 F (37.2 C)] 98.3 F (36.8 C) (08/20 1148) Pulse Rate:  [45-57] 46 (08/20 1148) Resp:  [14-25] 20 (08/20 1148) BP: (117-156)/(41-57) 135/51 (08/20 1148) SpO2:  [91 %-100 %] 95 % (08/20 1148) Weight:  [82.7 kg-83.8 kg] 82.7 kg (08/20 0456) Last BM Date: 01/11/20 Gen: awake, alert, NAD HEENT: anicteric, op clear CV: brady, 2/6 sem Pulm: CTA b/l Abd: soft, NT/ND, +BS throughout Ext: no c/c/e Neuro: nonfocal   Intake/Output from previous day: 08/19 0701 - 08/20 0700 In: 1852.4 [P.O.:960; I.V.:159.1; Blood:733.3] Out: 1300 [Urine:1300] Intake/Output this shift: Total I/O In: 840 [P.O.:840] Out: 850 [Urine:850]  Lab Results: Recent Labs    01/11/20 1136 01/11/20 2339 01/12/20 0749  WBC 4.8 4.5 5.0  HGB 6.2* 9.1* 10.1*  HCT 21.4* 28.7* 32.4*  PLT 219 212 214   BMET Recent Labs    01/11/20 1136 01/12/20 0749  NA 135 137  K 4.4 4.2  CL 103 107  CO2 23 23  GLUCOSE 219* 106*  BUN 16 11  CREATININE 1.49* 1.32*  CALCIUM 8.5* 8.4*   LFT Recent Labs    01/11/20 1136  PROT 5.3*  ALBUMIN 3.1*  AST 22  ALT 27  ALKPHOS 58  BILITOT 0.5   PT/INR Recent Labs    01/11/20 1524 01/12/20 0749  LABPROT 27.3* 21.8*  INR 2.6* 2.0*     Assessment & Recommendations  74 year old male with CAD with PCI on Plavix, A. fib with recent RVR on warfarin and amiodarone, hypertension, diabetes, history of iron deficiency anemia admitted with symptomatic anemia and heme positive stool  1. Heme positive stool/IDA --upper endoscopy recommended today. He has responded well to blood transfusions. Colonoscopy is up-to-date and was negative for source of iron deficiency in 2017. --Upper  endoscopy recommended --Video capsule endoscopy likely next step if upper endoscopy negative --Monitor blood counts closely --Continue IV iron as an outpatient --Plavix will likely be discontinued permanently per cardiology though he will eventually resume warfarin --Upper endoscopy discussed today including the risk, benefits and alternatives and he is agreeable and wishes to proceed. Slightly higher risk with Plavix administration as recently as yesterday and INR 2.0. This may limit some therapeutic options which we discussed together.        LOS: 1 day   Jerene Bears  01/12/2020, 12:18 PM

## 2020-01-12 NOTE — Plan of Care (Signed)

## 2020-01-12 NOTE — Progress Notes (Signed)
PROGRESS NOTE  Seth Hardy HYW:[id number] DOB: 1945/09/02 DOA: 01/11/2020 PCP: Rusty Aus, MD   LOS: 1 day   Brief narrative: As per HPI,  Seth Hardy is a 74 y.o. male with medical history significant of CAD s/p CABG in 2011-PCI in 2013, and in April 2021, PAF on Coumadin, moderate aortic stenosis, chronic anemia secondary to chronic GI bleeding who was managed with IV iron in the outpatient setting presented to the hospital for severe anemia and ongoing melena. Per patient, he has had longstanding issues with anemia-and has been followed by hematology and been given IV iron.  It is thought that he has had a slow/chronic GI bleed.  Plans were for bone marrow biopsy at some point.  He has had a negative EGD/colonoscopy in 2017.  Has been having melena and dark appearing stool for few years now but on a daily basis for the last few weeks.  He did have some intermittent chest pain as well.  He had gone to see GI physician in the office when he was noted to have hemoglobin of 6.5 with dark stools which was heme positive.  He was then referred to the hospital for further evaluation and treatment.  In the ED, patient received 2 units of packed RBC and the patient was admitted to the hospital. .  Assessment/Plan:  Principal Problem:   UGI bleed Active Problems:   CAD, NATIVE VESSEL   Aortic valve stenosis, mild   Long term (current) use of anticoagulants   Atrial fibrillation (HCC)   Hyperlipidemia   Diabetes mellitus type II, controlled (HCC)   Iron deficiency anemia due to chronic blood loss   Benign essential hypertension  Upper GI bleed with acute blood loss anemia: Status post 2 units of packed RBC.  GI on board.  Cardiology has seen the patient and patient is okay for endoscopic evaluation.  INR is still elevated.  INR of 2.6 at this time.  Patient was on Coumadin as outpatient.  Continue Protonix drip, sucralfate.  EGD today with findings of gastric polyps and moderate  gastric antral vascular ectasia.  GAVE was found to be actively bleeding and argon plasma fulguration was performed with cessation of bleeding. Polyps were not removed since the patient was recently on Plavix and INR was elevated.  GI recommend follow-up endoscopy to remove polyps and completely treat GAVE.  GI recommends to continue to hold Plavix and Coumadin for now.  Protonix p.o. twice daily.  Chest pain: History of angina.  No current chest pain.  Her most recent PCI April 2021.  Cardiology has seen the patient.  Okay for endoscopic intervention.  EKG showed T wave inversion but troponin was negative.  Continue statins.  Follow cardiology recommendation.  Paroxysmal atrial fibrillation.  With bradycardia in the past.  There was some discussion about the pacemaker placement at some point.  On amiodarone.  Coumadin on hold..   Mild aortic stenosis: Could follow-up as outpatient.   Essential HTN:  Blood pressure was soft on admission.  Blood pressure has improved and is elevated at this time.  Will resume ramipril and amlodipine at this time.  Continue to hold Lasix.  Diabetes mellitus type 2.  On heart healthy diet.  Resume full dose of Lantus.  Continue sliding scale insulin Accu-Cheks.    OSA: CPAP/O2 nightly.   DVT prophylaxis: SCDs Start: 01/11/20 1803   Code Status: Full code  Family Communication: Spoke with the patient's wife at bedside.  Status is: Inpatient  Remains inpatient appropriate because:IV treatments appropriate due to intensity of illness or inability to take PO, Inpatient level of care appropriate due to severity of illness and Possible need for endoscopic evaluation   Dispo: The patient is from: Home              Anticipated d/c is to: Home              Anticipated d/c date is: 1 to 2 days              Patient currently is not medically stable to d/c.   Consultants:  Cardiology  GI  Procedures:  Transfusion of packed RBC 2 units  Upper GI  endoscopy with APC treatment on 01/12/2020  Antibiotics:  . None  Anti-infectives (From admission, onward)   None     Subjective: Today, patient was seen and examined at bedside.  He was seen before endoscopic evaluation.  Patient complained of melena but no abdominal pain.  No chest pain or shortness of breath at the time of my interview.  Objective: Vitals:   01/12/20 0456 01/12/20 0725  BP: (!) 141/51 (!) 149/57  Pulse: (!) 46 (!) 50  Resp: 18 17  Temp: 98.5 F (36.9 C) 98.3 F (36.8 C)  SpO2: 94% 97%    Intake/Output Summary (Last 24 hours) at 01/12/2020 0910 Last data filed at 01/12/2020 0854 Gross per 24 hour  Intake 2452.39 ml  Output 1800 ml  Net 652.39 ml   Filed Weights   01/11/20 1109 01/11/20 1820 01/12/20 0456  Weight: 81.6 kg 83.8 kg 82.7 kg   Body mass index is 27.72 kg/m.   Physical Exam:  GENERAL: Patient is alert awake and oriented. Not in obvious distress. HENT: Mild pallor noted pupils equally reactive to light. Oral mucosa is moist NECK: is supple, no gross swelling noted. CHEST: Clear to auscultation. No crackles or wheezes.  Diminished breath sounds bilaterally. CVS: S1 and S2 heard, systolic murmur noted.  Mild bradycardia noted ABDOMEN: Soft, non-tender, bowel sounds are present. EXTREMITIES: No edema. CNS: Cranial nerves are intact. No focal motor deficits. SKIN: warm and dry without rashes.  Data Review: I have personally reviewed the following laboratory data and studies,  CBC: Recent Labs  Lab 01/08/20 1252 01/11/20 0925 01/11/20 1136 01/11/20 2339 01/12/20 0749  WBC 4.2 5.0 4.8 4.5 5.0  NEUTROABS 3.1 3.5  --   --   --   HGB 7.2* 6.5 Repeated and verified X2.* 6.2* 9.1* 10.1*  HCT 22.4* 19.9 cL* 21.4* 28.7* 32.4*  MCV 104.2* 104.9* 115.1* 100.7* 101.9*  PLT 233 216.0 219 212 678   Basic Metabolic Panel: Recent Labs  Lab 01/08/20 1252 01/11/20 1136  NA 135 135  K 4.8 4.4  CL 104 103  CO2 23 23  GLUCOSE 229* 219*   BUN 25* 16  CREATININE 1.59* 1.49*  CALCIUM 8.3* 8.5*   Liver Function Tests: Recent Labs  Lab 01/11/20 1136  AST 22  ALT 27  ALKPHOS 58  BILITOT 0.5  PROT 5.3*  ALBUMIN 3.1*   No results for input(s): LIPASE, AMYLASE in the last 168 hours. No results for input(s): AMMONIA in the last 168 hours. Cardiac Enzymes: No results for input(s): CKTOTAL, CKMB, CKMBINDEX, TROPONINI in the last 168 hours. BNP (last 3 results) No results for input(s): BNP in the last 8760 hours.  ProBNP (last 3 results) No results for input(s): PROBNP in the last 8760 hours.  CBG: Recent Labs  Lab 01/11/20 2120  01/12/20 0558  GLUCAP 246* 103*   Recent Results (from the past 240 hour(s))  SARS Coronavirus 2 by RT PCR (hospital order, performed in [facility] hospital lab) Nasopharyngeal Nasopharyngeal Swab     Status: None   Collection Time: 01/11/20  3:15 PM   Specimen: Nasopharyngeal Swab  Result Value Ref Range Status   SARS Coronavirus 2 NEGATIVE NEGATIVE Final    Comment: (NOTE) SARS-CoV-2 target nucleic acids are NOT DETECTED.  The SARS-CoV-2 RNA is generally detectable in upper and lower respiratory specimens during the acute phase of infection. The lowest concentration of SARS-CoV-2 viral copies this assay can detect is 250 copies / mL. A negative result does not preclude SARS-CoV-2 infection and should not be used as the sole basis for treatment or other patient management decisions.  A negative result may occur with improper specimen collection / handling, submission of specimen other than nasopharyngeal swab, presence of viral mutation(s) within the areas targeted by this assay, and inadequate number of viral copies (<250 copies / mL). A negative result must be combined with clinical observations, patient history, and epidemiological information.  Fact Sheet for Patients:   StrictlyIdeas.no  Fact Sheet for Healthcare  Providers: BankingDealers.co.za  This test is not yet approved or  cleared by the [place] FDA and has been authorized for detection and/or diagnosis of SARS-CoV-2 by FDA under an Emergency Use Authorization (EUA).  This EUA will remain in effect (meaning this test can be used) for the duration of the COVID-19 declaration under Section 564(b)(1) of the Act, 21 U.S.C. section 360bbb-3(b)(1), unless the authorization is terminated or revoked sooner.  Performed at [facility], [place] [address] [place], [place] [address]      Studies: No results found.    Flora Lipps, MD  [facility] 01/12/2020

## 2020-01-13 DIAGNOSIS — I4891 Unspecified atrial fibrillation: Secondary | ICD-10-CM

## 2020-01-13 DIAGNOSIS — E785 Hyperlipidemia, unspecified: Secondary | ICD-10-CM

## 2020-01-13 LAB — CBC
HCT: 27.6 % — ABNORMAL LOW (ref 39.0–52.0)
Hemoglobin: 8.5 g/dL — ABNORMAL LOW (ref 13.0–17.0)
MCH: 31.8 pg (ref 26.0–34.0)
MCHC: 30.8 g/dL (ref 30.0–36.0)
MCV: 103.4 fL — ABNORMAL HIGH (ref 80.0–100.0)
Platelets: 179 10*3/uL (ref 150–400)
RBC: 2.67 MIL/uL — ABNORMAL LOW (ref 4.22–5.81)
RDW: 20 % — ABNORMAL HIGH (ref 11.5–15.5)
WBC: 4.2 10*3/uL (ref 4.0–10.5)
nRBC: 0 % (ref 0.0–0.2)

## 2020-01-13 LAB — PROTIME-INR
INR: 1.7 — ABNORMAL HIGH (ref 0.8–1.2)
Prothrombin Time: 19.1 seconds — ABNORMAL HIGH (ref 11.4–15.2)

## 2020-01-13 LAB — BASIC METABOLIC PANEL
Anion gap: 6 (ref 5–15)
BUN: 11 mg/dL (ref 8–23)
CO2: 26 mmol/L (ref 22–32)
Calcium: 8.4 mg/dL — ABNORMAL LOW (ref 8.9–10.3)
Chloride: 106 mmol/L (ref 98–111)
Creatinine, Ser: 1.25 mg/dL — ABNORMAL HIGH (ref 0.61–1.24)
GFR calc Af Amer: >60 mL/min (ref >60)
GFR calc non Af Amer: 57 mL/min — ABNORMAL LOW (ref >60)
Glucose, Bld: 200 mg/dL — ABNORMAL HIGH (ref 70–99)
Potassium: 4 mmol/L (ref 3.5–5.1)
Sodium: 138 mmol/L (ref 135–145)

## 2020-01-13 LAB — GLUCOSE, CAPILLARY
Glucose-Capillary: 119 mg/dL — ABNORMAL HIGH (ref 70–99)
Glucose-Capillary: 283 mg/dL — ABNORMAL HIGH (ref 70–99)
Glucose-Capillary: 255 mg/dL — ABNORMAL HIGH (ref 70–99)
Glucose-Capillary: 192 mg/dL — ABNORMAL HIGH (ref 70–99)

## 2020-01-13 LAB — MAGNESIUM: Magnesium: 2.2 mg/dL (ref 1.7–2.4)

## 2020-01-13 NOTE — Plan of Care (Signed)

## 2020-01-13 NOTE — Progress Notes (Signed)
PROGRESS NOTE    Seth Hardy  WUX:[id number] DOB: Dec 31, 1945 DOA: 01/11/2020 PCP: Rusty Aus, MD    Brief Narrative:  Patient admitted to the hospital working diagnosis of acute blood loss anemia due to upper GI bleed.  74 year old male with past medical history for coronary disease status post bypass grafting 2011, PCI 2013/2021, paroxysmal atrial fibrillation, moderate aortic stenosis, chronic anemia due to iron deficiency.  History of chronic gastrointestinal bleed.  Patient reported worsening melena associated with intermittent chest pain.  On the day of admission he was found to have a hemoglobin 6.5 at the GI clinic, his stools were dark and heme positive.  He was referred to the hospital.  On his initial physical examination blood pressure 128/47, heart rate 52, temperature 99, respirate 19, oxygen saturation 100%.  His lungs are clear to auscultation bilaterally, heart S1-S2, present rhythmic, abdomen was soft, no lower extremity edema. Sodium 135, potassium 4.4, chloride 103, bicarb 23, glucose 219, BUN 16, creatinine 1.49, white count 4.8, hemoglobin 6.2, hematocrit 21.4, platelets 219.  SARS COVID-19 negative.  Patient received 2 packed red blood cell transfusion with good toleration.  Received pantoprazole continuous infusion.  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  Positive active bleeding, treated with argon plasma fulguration.  Assessment & Plan:   Principal Problem:   UGI bleed Active Problems:   CAD, NATIVE VESSEL   Aortic valve stenosis, mild   Long term (current) use of anticoagulants   Atrial fibrillation (HCC)   Hyperlipidemia   Diabetes mellitus type II, controlled (HCC)   Iron deficiency anemia due to chronic blood loss   Benign essential hypertension   Heme positive stool   Melena   GAVE (gastric antral vascular ectasia)   1. Acute blood loss anemia due to upper GI bleed/ gastric antral vascular  ectasia. Sp 2 units PRBC transfusion. And endoscopic treatment. Hgb is 8.5 this am.   Continue close monitoring of Hgb and Hct., plan for repeat EGD on Monday. Continue antiacid therapy with pantoprazole and sucralfate.   Continue holding clopidogrel.   2. Paroxysmal atrial fibrillation. Continue telemetry monitoring. Rate control with amiodarone. Holding on anticoagulation for now. INR is 1,7.  Cardiology planning on pace maker implantation.   3. HTN. Continue blood pressure control with isosorbide and ramipril/   4. T2DM/ dyslipidemia. Continue basal insulin and sliding scale for glucose control. Patient is tolerating po well.   Continue with statin therapy.   5. OSA. Continue CPAP as tolerated.   6. Anxiety/ Continue with alprazolam.   Status is: Inpatient  Remains inpatient appropriate because:IV treatments appropriate due to intensity of illness or inability to take PO   Dispo: The patient is from: Home              Anticipated d/c is to: Home              Anticipated d/c date is: 3 days              Patient currently is not medically stable to d/c.   DVT prophylaxis: scd   Code Status:   full  Family Communication:  I spoke with patient's wife at the bedside, we talked in detail about patient's condition, plan of care and prognosis and all questions were addressed.      Consultants:   Cardiology   Procedures:   EGD   Subjective: Patient continue to feel very weak and deconditioned, no nausea or vomiting, no chest  pain or dyspnea.   Objective: Vitals:   01/12/20 1939 01/13/20 0523 01/13/20 1012 01/13/20 1220  BP: (!) 139/43 (!) 160/43 (!) 147/47 (!) 132/40  Pulse: (!) 49 (!) 50 (!) 52 (!) 46  Resp: 18 18 20 18   Temp: 98.2 F (36.8 C) 98.1 F (36.7 C) 98.1 F (36.7 C) 98 F (36.7 C)  TempSrc: Oral Oral Oral Oral  SpO2: 100% 100% 96% 99%  Weight:  82.2 kg    Height:        Intake/Output Summary (Last 24 hours) at 01/13/2020 1600 Last data filed at  01/13/2020 1500 Gross per 24 hour  Intake 1386.05 ml  Output 125 ml  Net 1261.05 ml   Filed Weights   01/12/20 0456 01/12/20 1223 01/13/20 0523  Weight: 82.7 kg 81.6 kg 82.2 kg    Examination:   General: Not in pain or dyspnea, deconditioned  Neurology: Awake and alert, non focal  E ENT: mild pallor, no icterus, oral mucosa moist Cardiovascular: No JVD. S1-S2 present, rhythmic, no gallops, rubs, or murmurs. No lower extremity edema. Pulmonary: positive breath sounds bilaterally, adequate air movement, no wheezing, rhonchi or rales. Gastrointestinal. Abdomen soft and non tender Skin. No rashes Musculoskeletal: no joint deformities     Data Reviewed: I have personally reviewed following labs and imaging studies  CBC: Recent Labs  Lab 01/08/20 1252 01/08/20 1252 01/11/20 0925 01/11/20 1136 01/11/20 2339 01/12/20 0749 01/13/20 0234  WBC 4.2   < > 5.0 4.8 4.5 5.0 4.2  NEUTROABS 3.1  --  3.5  --   --   --   --   HGB 7.2*   < > 6.5 Repeated and verified X2.* 6.2* 9.1* 10.1* 8.5*  HCT 22.4*   < > 19.9 cL* 21.4* 28.7* 32.4* 27.6*  MCV 104.2*   < > 104.9* 115.1* 100.7* 101.9* 103.4*  PLT 233   < > 216.0 219 212 214 179   < > = values in this interval not displayed.   Basic Metabolic Panel: Recent Labs  Lab 01/08/20 1252 01/11/20 1136 01/12/20 0749 01/13/20 0234  NA 135 135 137 138  K 4.8 4.4 4.2 4.0  CL 104 103 107 106  CO2 23 23 23 26   GLUCOSE 229* 219* 106* 200*  BUN 25* 16 11 11   CREATININE 1.59* 1.49* 1.32* 1.25*  CALCIUM 8.3* 8.5* 8.4* 8.4*  MG  --   --   --  2.2   GFR: Estimated Creatinine Clearance: 55 mL/min (A) (by C-G formula based on SCr of 1.25 mg/dL (H)). Liver Function Tests: Recent Labs  Lab 01/11/20 1136  AST 22  ALT 27  ALKPHOS 58  BILITOT 0.5  PROT 5.3*  ALBUMIN 3.1*   No results for input(s): LIPASE, AMYLASE in the last 168 hours. No results for input(s): AMMONIA in the last 168 hours. Coagulation Profile: Recent Labs  Lab  01/10/20 0839 01/11/20 1524 01/12/20 0749 01/13/20 1048  INR 5.3* 2.6* 2.0* 1.7*   Cardiac Enzymes: No results for input(s): CKTOTAL, CKMB, CKMBINDEX, TROPONINI in the last 168 hours. BNP (last 3 results) No results for input(s): PROBNP in the last 8760 hours. HbA1C: No results for input(s): HGBA1C in the last 72 hours. CBG: Recent Labs  Lab 01/12/20 1149 01/12/20 1649 01/12/20 2107 01/13/20 0556 01/13/20 1113  GLUCAP 157* 281* 294* 119* 283*   Lipid Profile: No results for input(s): CHOL, HDL, LDLCALC, TRIG, CHOLHDL, LDLDIRECT in the last 72 hours. Thyroid Function Tests: No results for input(s): TSH, T4TOTAL,  FREET4, T3FREE, THYROIDAB in the last 72 hours. Anemia Panel: No results for input(s): VITAMINB12, FOLATE, FERRITIN, TIBC, IRON, RETICCTPCT in the last 72 hours.    Radiology Studies: I have reviewed all of the imaging during this hospital visit personally     Scheduled Meds: . amiodarone  200 mg Oral QPM  . amLODipine  5 mg Oral Daily  . vitamin C  500 mg Oral QPM  . insulin aspart  0-9 Units Subcutaneous TID WC  . insulin glargine  15 Units Subcutaneous QHS  . isosorbide mononitrate  30 mg Oral Daily  . omega-3 acid ethyl esters  1 g Oral QHS  . pantoprazole  40 mg Oral BID AC  . ramipril  10 mg Oral Daily  . rosuvastatin  40 mg Oral q1800  . sodium chloride flush  3 mL Intravenous Q12H  . sodium chloride flush  3 mL Intravenous Q12H  . sucralfate  1 g Oral BID   Continuous Infusions: . sodium chloride       LOS: 2 days        Rozalia Dino Gerome Apley, MD

## 2020-01-13 NOTE — Progress Notes (Signed)
    Progress Note   Subjective  Patient feels well today No evidence of overt bleeding Tolerating regular diet No abdominal pain   Objective  Vital signs in last 24 hours: Temp:  [98 F (36.7 C)-98.5 F (36.9 C)] 98 F (36.7 C) (08/21 1220) Pulse Rate:  [46-52] 46 (08/21 1220) Resp:  [18-20] 18 (08/21 1220) BP: (132-163)/(37-58) 132/40 (08/21 1220) SpO2:  [96 %-100 %] 99 % (08/21 1220) Weight:  [82.2 kg] 82.2 kg (08/21 0523) Last BM Date: 01/12/20  Gen: awake, alert, NAD HEENT: anicteric CV: brady, 2/6 sem Pulm: CTA b/l Abd: soft, NT/ND, +BS throughout Ext: no c/c/e Neuro: nonfocal  Intake/Output from previous day: 08/20 0701 - 08/21 0700 In: 1946.1 [P.O.:1680; I.V.:266.1] Out: 975 [Urine:975] Intake/Output this shift: Total I/O In: 240 [P.O.:240] Out: -   Lab Results: Recent Labs    01/11/20 2339 01/12/20 0749 01/13/20 0234  WBC 4.5 5.0 4.2  HGB 9.1* 10.1* 8.5*  HCT 28.7* 32.4* 27.6*  PLT 212 214 179   BMET Recent Labs    01/11/20 1136 01/12/20 0749 01/13/20 0234  NA 135 137 138  K 4.4 4.2 4.0  CL 103 107 106  CO2 23 23 26   GLUCOSE 219* 106* 200*  BUN 16 11 11   CREATININE 1.49* 1.32* 1.25*  CALCIUM 8.5* 8.4* 8.4*   LFT Recent Labs    01/11/20 1136  PROT 5.3*  ALBUMIN 3.1*  AST 22  ALT 27  ALKPHOS 58  BILITOT 0.5   PT/INR Recent Labs    01/12/20 0749 01/13/20 1048  LABPROT 21.8* 19.1*  INR 2.0* 1.7*   Hepatitis Panel No results for input(s): HEPBSAG, HCVAB, HEPAIGM, HEPBIGM in the last 72 hours.  Studies/Results: No results found.    Assessment & Recommendations  74 year old male with CAD and recent PCI on Plavix until 2020-01-10, atrial fibrillation on warfarin and amiodarone, hypertension, diabetes and iron deficiency anemia admitted with symptomatic anemia and heme positive stool found to have bleeding GAVE  1. IDA/GAVE --actively bleeding vascular antral ectasia treated with APC yesterday.  Hemoglobin has stabilized.   Hemoglobin is 8.5, was previously 10.1 but 8.5 is appropriate given that he started at 6.2 and received only 2 units of red cells.  Plavix has been held and will not be restarted per cardiology, warfarin also on hold and INR now down to 1.7 --Actively bleeding GAVE treated successfully yesterday --He has additional GAVE which warrants further treatment with APC given the need for chronic anticoagulation.  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  Given that he is now off of anticoagulation and Plavix has been held it is reasonable to plan repeat upper endoscopy on Monday, 2020-01-15 for additional APC and gastric polypectomy. --Okay for heparin infusion with close monitoring of hemoglobin until 6 hours before upper endoscopy Monday --Hold warfarin --Heart healthy diet until n.p.o. after midnight Sunday into Monday --He is already received IV iron as an outpatient  EGD Monday with APC for GAVE and probable gastric polypectomy (Monday will be 5 days off Plavix).        LOS: 2 days   Seth Hardy  01/13/2020, 2:46 PM

## 2020-01-13 NOTE — Progress Notes (Signed)
Progress Note  Patient Name: Seth Hardy Date of Encounter: 01/13/2020  [facility] Cardiologist: Kathlyn Sacramento, MD   Subjective   Underwent an EGD yesterday, showed GAVE with bleeding, treated with APC with resolution of bleeding.  Hemoglobin 8.5 today, down from 10.1 yesterday.  Denies any chest pain or dyspnea.  Inpatient Medications    Scheduled Meds: . amiodarone  200 mg Oral QPM  . amLODipine  5 mg Oral Daily  . vitamin C  500 mg Oral QPM  . insulin aspart  0-9 Units Subcutaneous TID WC  . insulin glargine  15 Units Subcutaneous QHS  . isosorbide mononitrate  30 mg Oral Daily  . omega-3 acid ethyl esters  1 g Oral QHS  . pantoprazole  40 mg Oral BID AC  . ramipril  10 mg Oral Daily  . rosuvastatin  40 mg Oral q1800  . sodium chloride flush  3 mL Intravenous Q12H  . sodium chloride flush  3 mL Intravenous Q12H  . sucralfate  1 g Oral BID   Continuous Infusions: . sodium chloride     PRN Meds: sodium chloride, acetaminophen **OR** acetaminophen, albuterol, ALPRAZolam, nitroGLYCERIN, ondansetron **OR** ondansetron (ZOFRAN) IV, polyvinyl alcohol, sodium chloride flush   Vital Signs    Vitals:   01/12/20 1454 01/12/20 1654 01/12/20 1939 01/13/20 0523  BP: (!) 163/58 (!) 138/37 (!) 139/43 (!) 160/43  Pulse:  (!) 46 (!) 49 (!) 50  Resp: 20 18 18 18   Temp:  98.5 F (36.9 C) 98.2 F (36.8 C) 98.1 F (36.7 C)  TempSrc:  Oral Oral Oral  SpO2:  96% 100% 100%  Weight:    82.2 kg  Height:        Intake/Output Summary (Last 24 hours) at 01/13/2020 1004 Last data filed at 01/13/2020 0900 Gross per 24 hour  Intake 1346.05 ml  Output 475 ml  Net 871.05 ml   Last 3 Weights 01/13/2020 01/12/2020 01/12/2020  Weight (lbs) 181 lb 3.2 oz 180 lb 182 lb 4.8 oz  Weight (kg) 82.192 kg 81.647 kg 82.691 kg      Telemetry    Sinus bradycardia rate 40s to 50s- Personally Reviewed  ECG    01/11/2020-sinus bradycardia rate 51 with T wave inversions fairly diffusely, no  change from prior- Personally Reviewed  Physical Exam   GEN: No acute distress.   Neck: No JVD Cardiac:  Bradycardic regular, 2/6 systolic murmur, rubs, or gallops.  Respiratory: Clear to auscultation bilaterally. GI: Soft, nontender, non-distended  MS: No edema; No deformity. Neuro:  Nonfocal  Psych: Normal affect   Labs    High Sensitivity Troponin:   Recent Labs  Lab 12/26/19 0126 12/26/19 0715 12/26/19 1738 12/27/19 0008 01/11/20 1524  TROPONINIHS 102* 754* 809* 499* 13      Chemistry Recent Labs  Lab 01/11/20 1136 01/12/20 0749 01/13/20 0234  NA 135 137 138  K 4.4 4.2 4.0  CL 103 107 106  CO2 23 23 26   GLUCOSE 219* 106* 200*  BUN 16 11 11   CREATININE 1.49* 1.32* 1.25*  CALCIUM 8.5* 8.4* 8.4*  PROT 5.3*  --   --   ALBUMIN 3.1*  --   --   AST 22  --   --   ALT 27  --   --   ALKPHOS 58  --   --   BILITOT 0.5  --   --   GFRNONAA 46* 53* 57*  GFRAA 53* >60 >60  ANIONGAP 9 7 6  Hematology Recent Labs  Lab 01/11/20 2339 01/12/20 0749 01/13/20 0234  WBC 4.5 5.0 4.2  RBC 2.85* 3.18* 2.67*  HGB 9.1* 10.1* 8.5*  HCT 28.7* 32.4* 27.6*  MCV 100.7* 101.9* 103.4*  MCH 31.9 31.8 31.8  MCHC 31.7 31.2 30.8  RDW 19.9* 20.8* 20.0*  PLT 212 214 179    BNPNo results for input(s): BNP, PROBNP in the last 168 hours.   DDimer No results for input(s): DDIMER in the last 168 hours.   Radiology    No results found.  Cardiac Studies   Echocardiogram 12/26/2019-EF 16% grade 2 diastolic dysfunction mildly dilated left atrium mild to moderate aortic valve stenosis.  Patient Profile     74 y.o. male here with recurrent upper GI bleed, CAD post CABG, recent stent placement in April 2021.  Assessment & Plan    GI bleed-hemoglobin 6.2 on arrival.  Underwent an EGD yesterday, showed GAVE with bleeding, treated with APC with resolution of bleeding -Plavix and coumadin on hold.  INR 2.0 yesterday, will check INR.  Continue to hold plavix and coumadin, can start  heparin gtt once OK per GI and if INR <2 -Plan was to discontinue plavix moving forward as he is greater than 3 months post  New generation stent placement.  May consider aspirin plus Coumadin moving forward once able to restart anticoagulation/antiplatelet per GI  Paroxysmal atrial fibrillation: on amiodarone, Coumadin.  Maintaining sinus rhythm.  CAD: s/p DES to RCA 08/4019.  Was on Plavix and Coumadin, holding both in setting of GI bleeding.  May switch to aspirin plus Coumadin once able to restart per GI  Aortic stenosis: Mild to moderate on echo 12/26/2019  For questions or updates, please contact [place] Please consult [email or web address] for contact info under        Signed, Donato Heinz, MD  01/13/2020, 10:03 AM

## 2020-01-14 ENCOUNTER — Encounter (HOSPITAL_COMMUNITY): Payer: Self-pay | Admitting: Internal Medicine

## 2020-01-14 LAB — CBC
HCT: 29.8 % — ABNORMAL LOW (ref 39.0–52.0)
Hemoglobin: 9.5 g/dL — ABNORMAL LOW (ref 13.0–17.0)
MCH: 32.9 pg (ref 26.0–34.0)
MCHC: 31.9 g/dL (ref 30.0–36.0)
MCV: 103.1 fL — ABNORMAL HIGH (ref 80.0–100.0)
Platelets: 180 10*3/uL (ref 150–400)
RBC: 2.89 MIL/uL — ABNORMAL LOW (ref 4.22–5.81)
RDW: 19.1 % — ABNORMAL HIGH (ref 11.5–15.5)
WBC: 4.1 10*3/uL (ref 4.0–10.5)
nRBC: 0 % (ref 0.0–0.2)

## 2020-01-14 LAB — BASIC METABOLIC PANEL
Anion gap: 9 (ref 5–15)
BUN: 12 mg/dL (ref 8–23)
CO2: 26 mmol/L (ref 22–32)
Calcium: 8.7 mg/dL — ABNORMAL LOW (ref 8.9–10.3)
Chloride: 105 mmol/L (ref 98–111)
Creatinine, Ser: 1.3 mg/dL — ABNORMAL HIGH (ref 0.61–1.24)
GFR calc Af Amer: >60 mL/min (ref >60)
GFR calc non Af Amer: 54 mL/min — ABNORMAL LOW (ref >60)
Glucose, Bld: 135 mg/dL — ABNORMAL HIGH (ref 70–99)
Potassium: 4 mmol/L (ref 3.5–5.1)
Sodium: 140 mmol/L (ref 135–145)

## 2020-01-14 LAB — GLUCOSE, CAPILLARY
Glucose-Capillary: 117 mg/dL — ABNORMAL HIGH (ref 70–99)
Glucose-Capillary: 282 mg/dL — ABNORMAL HIGH (ref 70–99)
Glucose-Capillary: 241 mg/dL — ABNORMAL HIGH (ref 70–99)
Glucose-Capillary: 229 mg/dL — ABNORMAL HIGH (ref 70–99)

## 2020-01-14 LAB — PROTIME-INR
INR: 1.5 — ABNORMAL HIGH (ref 0.8–1.2)
Prothrombin Time: 17.1 seconds — ABNORMAL HIGH (ref 11.4–15.2)

## 2020-01-14 MED ORDER — ASPIRIN EC 81 MG PO TBEC
81.0000 mg | DELAYED_RELEASE_TABLET | Freq: Every day | ORAL | Status: DC
  Administered 2020-01-14 – 2020-01-16 (×3): 81 mg via ORAL
  Filled 2020-01-14 (×3): qty 1

## 2020-01-14 MED ORDER — HEPARIN (PORCINE) 25000 UT/250ML-% IV SOLN
1200.0000 [IU]/h | INTRAVENOUS | Status: AC
  Administered 2020-01-14: 1200 [IU]/h via INTRAVENOUS
  Filled 2020-01-14: qty 250

## 2020-01-14 MED ORDER — POLYETHYLENE GLYCOL 3350 17 G PO PACK
17.0000 g | PACK | Freq: Every day | ORAL | Status: DC
  Administered 2020-01-14 – 2020-01-16 (×3): 17 g via ORAL
  Filled 2020-01-14 (×4): qty 1

## 2020-01-14 NOTE — Progress Notes (Signed)
PROGRESS NOTE    Seth Hardy  KGU:[id number] DOB: [birth year] DOA: 01/11/2020 PCP: Rusty Aus, MD    Brief Narrative:  Patient admitted to the hospital working diagnosis of acute blood loss anemia due to upper GI bleed.  74 year old male with past medical history for coronary disease status post bypass grafting 2011, PCI 2013/2021, paroxysmal atrial fibrillation, moderate aortic stenosis, chronic anemia due to iron deficiency.  History of chronic gastrointestinal bleed.  Patient reported worsening melena associated with intermittent chest pain.  On the day of admission he was found to have a hemoglobin 6.5 at the GI clinic, his stools were dark and heme positive.  He was referred to the hospital.  On his initial physical examination blood pressure 128/47, heart rate 52, temperature 99, respirate 19, oxygen saturation 100%.  His lungs are clear to auscultation bilaterally, heart S1-S2, present rhythmic, abdomen was soft, no lower extremity edema. Sodium 135, potassium 4.4, chloride 103, bicarb 23, glucose 219, BUN 16, creatinine 1.49, white count 4.8, hemoglobin 6.2, hematocrit 21.4, platelets 219.  SARS COVID-19 negative.  Patient received 2 packed red blood cell transfusion with good toleration.  Received pantoprazole continuous infusion.  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  Positive active bleeding, treated with argon plasma fulguration.   Assessment & Plan:   Principal Problem:   UGI bleed Active Problems:   CAD, NATIVE VESSEL   Aortic valve stenosis, mild   Long term (current) use of anticoagulants   Atrial fibrillation (HCC)   Hyperlipidemia   Diabetes mellitus type II, controlled (HCC)   Iron deficiency anemia due to chronic blood loss   Benign essential hypertension   Heme positive stool   Melena   GAVE (gastric antral vascular ectasia)   1. Acute blood loss anemia due to upper GI bleed/ gastric antral  vascular ectasia (GAVE). Sp 2 units PRBC transfusion and endoscopic treatment/ argon plasma coagulation (08/20) Hgb this am is up to 9,5 and hct at 29,8.   Plan for repeat EGD on tomorrow for further ablation of GAVE/ Tolerating well medical therapy with pantoprazole and sucralfate.   Clopidogrel has been on hold.    2. Paroxysmal atrial fibrillation. On amiodarone for rate control. His INR today is 1.5. continue holding on warfarin. Heparin has been resumed. Plan to resume warfarin and aspirin at discharge.   3. Aortic stenosis. Moderate. No signs of acute decompensation. Continue blood pressure control.   4. CAD sp angioplasty 08/2019/ DES to RCA. Continue antiplatelet therapy with aspirin and antithrombotic therapy with warfarin. Plan to discontinue clopidogrel for now in the setting of GI bleeding and acute blood loss anemia.   3. HTN. On isosorbide, amlodipine and ramipril for blood pressure control.    4. Controlled T2DM Hgb A1c 5.7 / dyslipidemia. Fasting glucose this am 135, will continue basal insulin and sliding scale for glucose control..   Continue with rosuvastatin.   5. OSA. Continue CPAP.   6. Anxiety/ on as needed  alprazolam.   7. Constipation. Added miralax   Status is: Inpatient  Remains inpatient appropriate because:IV treatments appropriate due to intensity of illness or inability to take PO   Dispo: The patient is from: Home              Anticipated d/c is to: Home              Anticipated d/c date is: 2 days  Patient currently is not medically stable to d/c.  DVT prophylaxis: Heparin   Code Status:   fill  Family Communication:  I spoke with patient's wife  at the bedside, we talked in detail about patient's condition, plan of care and prognosis and all questions were addressed.      Nutrition Status:           Skin Documentation:     Consultants:   Cardiology   GI   Procedures:   EGD        Subjective: Patient feeling better, but not yet back to baseline, no nausea or vomiting, no chest pain or dyspnea.   Objective: Vitals:   01/13/20 1935 01/14/20 0342 01/14/20 1033 01/14/20 1143  BP: (!) 150/47 (!) 151/49 (!) 163/49 (!) 131/45  Pulse: (!) 50 (!) 45 (!) 43 (!) 43  Resp: 18 18 16 16   Temp: 98.3 F (36.8 C) 98.7 F (37.1 C) 98.1 F (36.7 C) 98 F (36.7 C)  TempSrc: Oral Oral Oral Oral  SpO2: 93% 97% 98% 96%  Weight:  82.7 kg    Height:        Intake/Output Summary (Last 24 hours) at 01/14/2020 1341 Last data filed at 01/14/2020 1300 Gross per 24 hour  Intake 1200 ml  Output 2 ml  Net 1198 ml   Filed Weights   01/12/20 1223 01/13/20 0523 01/14/20 0342  Weight: 81.6 kg 82.2 kg 82.7 kg    Examination:   General: Not in pain or dyspnea, deconditioned  Neurology: Awake and alert, non focal  E ENT: mild pallor, no icterus, oral mucosa moist Cardiovascular: No JVD. S1-S2 present, rhythmic, no gallops, positive systolic murmur 3/6. No lower extremity edema. Pulmonary: positive breath sounds bilaterally, adequate air movement, no wheezing, rhonchi or rales. Gastrointestinal. Abdomen soft and non tender Skin. No rashes Musculoskeletal: no joint deformities     Data Reviewed: I have personally reviewed following labs and imaging studies  CBC: Recent Labs  Lab 01/08/20 1252 01/08/20 1252 01/11/20 0925 01/11/20 0925 01/11/20 1136 01/11/20 2339 01/12/20 0749 01/13/20 0234 01/14/20 0544  WBC 4.2   < > 5.0   < > 4.8 4.5 5.0 4.2 4.1  NEUTROABS 3.1  --  3.5  --   --   --   --   --   --   HGB 7.2*   < > 6.5 Repeated and verified X2.*   < > 6.2* 9.1* 10.1* 8.5* 9.5*  HCT 22.4*   < > 19.9 cL*   < > 21.4* 28.7* 32.4* 27.6* 29.8*  MCV 104.2*   < > 104.9*   < > 115.1* 100.7* 101.9* 103.4* 103.1*  PLT 233   < > 216.0   < > 219 212 214 179 180   < > = values in this interval not displayed.   Basic Metabolic Panel: Recent Labs  Lab 01/08/20 1252  01/11/20 1136 01/12/20 0749 01/13/20 0234 01/14/20 0544  NA 135 135 137 138 140  K 4.8 4.4 4.2 4.0 4.0  CL 104 103 107 106 105  CO2 23 23 23 26 26   GLUCOSE 229* 219* 106* 200* 135*  BUN 25* 16 11 11 12   CREATININE 1.59* 1.49* 1.32* 1.25* 1.30*  CALCIUM 8.3* 8.5* 8.4* 8.4* 8.7*  MG  --   --   --  2.2  --    GFR: Estimated Creatinine Clearance: 53 mL/min (A) (by C-G formula based on SCr of 1.3 mg/dL (H)). Liver Function Tests: Recent Labs  Lab 01/11/20 1136  AST 22  ALT 27  ALKPHOS 58  BILITOT 0.5  PROT 5.3*  ALBUMIN 3.1*   No results for input(s): LIPASE, AMYLASE in the last 168 hours. No results for input(s): AMMONIA in the last 168 hours. Coagulation Profile: Recent Labs  Lab 01/10/20 0839 01/11/20 1524 01/12/20 0749 01/13/20 1048 01/14/20 0544  INR 5.3* 2.6* 2.0* 1.7* 1.5*   Cardiac Enzymes: No results for input(s): CKTOTAL, CKMB, CKMBINDEX, TROPONINI in the last 168 hours. BNP (last 3 results) No results for input(s): PROBNP in the last 8760 hours. HbA1C: No results for input(s): HGBA1C in the last 72 hours. CBG: Recent Labs  Lab 01/13/20 1113 01/13/20 1630 01/13/20 2135 01/14/20 0615 01/14/20 1055  GLUCAP 283* 255* 192* 117* 282*   Lipid Profile: No results for input(s): CHOL, HDL, LDLCALC, TRIG, CHOLHDL, LDLDIRECT in the last 72 hours. Thyroid Function Tests: No results for input(s): TSH, T4TOTAL, FREET4, T3FREE, THYROIDAB in the last 72 hours. Anemia Panel: No results for input(s): VITAMINB12, FOLATE, FERRITIN, TIBC, IRON, RETICCTPCT in the last 72 hours.    Radiology Studies: I have reviewed all of the imaging during this hospital visit personally     Scheduled Meds: . amiodarone  200 mg Oral QPM  . amLODipine  5 mg Oral Daily  . vitamin C  500 mg Oral QPM  . aspirin EC  81 mg Oral Daily  . insulin aspart  0-9 Units Subcutaneous TID WC  . insulin glargine  15 Units Subcutaneous QHS  . isosorbide mononitrate  30 mg Oral Daily  .  omega-3 acid ethyl esters  1 g Oral QHS  . pantoprazole  40 mg Oral BID AC  . ramipril  10 mg Oral Daily  . rosuvastatin  40 mg Oral q1800  . sodium chloride flush  3 mL Intravenous Q12H  . sodium chloride flush  3 mL Intravenous Q12H   Continuous Infusions: . sodium chloride    . heparin       LOS: 3 days        Anjali Manzella Gerome Apley, MD

## 2020-01-14 NOTE — Progress Notes (Signed)
    Progress Note   Subjective  Feeling well No GI complaint, no abdominal pain, no nausea or vomiting. No bowel movement since Friday which was dark at that time No chest pain or dyspnea   Objective  Vital signs in last 24 hours: Temp:  [98 F (36.7 C)-98.7 F (37.1 C)] 98.1 F (36.7 C) (08/22 1033) Pulse Rate:  [43-50] 43 (08/22 1033) Resp:  [16-18] 16 (08/22 1033) BP: (132-163)/(40-49) 163/49 (08/22 1033) SpO2:  [93 %-99 %] 98 % (08/22 1033) Weight:  [82.7 kg] 82.7 kg (08/22 0342) Last BM Date: 01/12/20  Gen: awake, alert, NAD HEENT: anicteric, op clear CV: brady, 2/6 sem Pulm: CTA b/l Abd: soft, NT/ND, +BS throughout Ext: no c/c/e Neuro: nonfocal   Intake/Output from previous day: 08/21 0701 - 08/22 0700 In: 22 [P.O.:960] Out: 3 [Urine:3] Intake/Output this shift: Total I/O In: 240 [P.O.:240] Out: -   Lab Results: Recent Labs    01/12/20 0749 01/13/20 0234 01/14/20 0544  WBC 5.0 4.2 4.1  HGB 10.1* 8.5* 9.5*  HCT 32.4* 27.6* 29.8*  PLT 214 179 180   BMET Recent Labs    01/12/20 0749 01/13/20 0234 01/14/20 0544  NA 137 138 140  K 4.2 4.0 4.0  CL 107 106 105  CO2 23 26 26   GLUCOSE 106* 200* 135*  BUN 11 11 12   CREATININE 1.32* 1.25* 1.30*  CALCIUM 8.4* 8.4* 8.7*   LFT Recent Labs    01/11/20 1136  PROT 5.3*  ALBUMIN 3.1*  AST 22  ALT 27  ALKPHOS 58  BILITOT 0.5   PT/INR Recent Labs    01/13/20 1048 01/14/20 0544  LABPROT 19.1* 17.1*  INR 1.7* 1.5*      Assessment & Recommendations  74 year old male with CAD status post recent PCI on Plavix until 01/10/2020, atrial fibrillation on warfarin and amiodarone, hypertension, diabetes and iron deficiency anemia admitted with symptomatic anemia and heme positive stool 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 GAVE/gastric polyps/IDA --hemoglobin has improved without additional transfusion from 8.5 to 9.5 g/dL.  He is feeling well.  He has been off Plavix for days now and warfarin also on  hold INR has drifted to 1.5.  We discussed repeat EGD tomorrow for further ablation of known GAVE (which was not done 2 days ago due to clopidogrel use and INR 2.0). --Plan upper endoscopy tomorrow with monitored anesthesia care for further APC ablation of GAVE.  Anticipate gastric polypectomy as well --He will continue follow-up with hematology as an outpatient and received IV iron as needed, I expect this will be less frequent now that we have identified most probable source of chronic blood loss --If he is started on heparin today this will need to be held at 1 AM for EGD tomorrow morning --N.p.o. after midnight except for sips with medications --Continue to hold Plavix which will likely not be restarted per cardiology and hold warfarin        LOS: 3 days   Jerene Bears  01/14/2020, 10:41 AM

## 2020-01-14 NOTE — Progress Notes (Addendum)
ANTICOAGULATION CONSULT NOTE - Initial Consult  Pharmacy Consult for heparin Indication: atrial fibrillation  No Known Allergies  Patient Measurements: Height: 5\' 8"  (172.7 cm) Weight: 82.7 kg (182 lb 4.8 oz) (scale b) IBW/kg (Calculated) : 68.4 Heparin Dosing Weight: 81.6  Vital Signs: Temp: 98 F (36.7 C) (08/22 1143) Temp Source: Oral (08/22 1143) BP: 131/45 (08/22 1143) Pulse Rate: 43 (08/22 1143)  Labs: Recent Labs    01/11/20 1524 01/11/20 2339 01/12/20 0749 01/12/20 0749 01/13/20 0234 01/13/20 1048 01/14/20 0544  HGB  --    < > 10.1*   < > 8.5*  --  9.5*  HCT  --    < > 32.4*  --  27.6*  --  29.8*  PLT  --    < > 214  --  179  --  180  LABPROT 27.3*  --  21.8*  --   --  19.1* 17.1*  INR 2.6*  --  2.0*  --   --  1.7* 1.5*  CREATININE  --   --  1.32*  --  1.25*  --  1.30*  TROPONINIHS 13  --   --   --   --   --   --    < > = values in this interval not displayed.    Estimated Creatinine Clearance: 53 mL/min (A) (by C-G formula based on SCr of 1.3 mg/dL (H)).   Medical History: Past Medical History:  Diagnosis Date  . Basal cell carcinoma of skin 2012   removed several spots from arms and back  . CAD (coronary artery disease)    a. 01/2010 CABG x 4: LIMA->LAD, VG->D1, VG->OM1, VG->PDA. b. NSTEMI 02/2012 in setting of AF-RVR with cath s/p BMS to RCA (plan for 1 month, possibly up to 3 months of Plavix)  . Carotid disease, bilateral ([place])    a. 03/2011 U/S: 40-59% bilat Carotid dzs;  b. 04/2015 Carotid U/S: 40-59% bilat ICA stenosis; c. 05/2015 CTA Neck: 93% RICA, 81% LICA, mod-marked R vertebral stenosis, mod L vertebral stenosis.  . Chronic kidney disease    small obtusion per pcp. ultrasound done on 08/29/2015  . Diabetes mellitus type II, controlled ([place])    a. Variable CBG 02/2012 - several meds adjusted.  Marland Kitchen Dysrhythmia    intermittent Atrial Fibrillation  . GERD (gastroesophageal reflux disease)   . Heart murmur   . Hyperlipidemia   . Hypertension   .  Hypertensive heart disease   . Iron deficiency anemia    "gets infusions q once in awhile" (04/27/2018)  . Iron deficiency anemia 08/2015   received 2 units rbc one week ago, 3 IV iron infusion -last 1 week.  . Macular edema bil   lazer work done previously  . Mild aortic stenosis    a. 03/2011 Echo: EF 55-60%, Mild AS. b. Mild by cath 02/2012; c. 01/2014 Echo: EF 65-70%, Gr 1 DD, mild AS, mildly dil LA.  . Multiple gastric ulcers 2006  . Myocardial infarction ([place]) 02/2012   stents (x1) at time  . Myocardial infarction [facility])    "2nd one was after 02/2012; don't know date" (04/27/2018)  . Obesity   . On home oxygen therapy    "2.5 w/CPAP" (04/27/2018)  . OSA on CPAP    CPAP settings 3 with oxygen 2.5 (04/27/2018)  . PAF (paroxysmal atrial fibrillation) ([place])    a. Newly dx 02/2012 & initiated on Coumadin (spont converted to NSR).  . Shortness of breath dyspnea   .  Transfusion history    transfusions -1 month ago -2 units    Medications:  Infusions:  . sodium chloride      Assessment: Patient was on coumadin for afib PTA, INR on admission was 5.3 so warfarin has been held. He was found to have upper GIB w/ Hgb 6.2 on arrival, s/p transfusion. EGD showed GAVE w/ bleeding, treated with APC.  Hgb 9.5, PLT 180. INR 1.5 today so plan is to transition to heparin. Scheduled EGD tomorrow (8/23) for ablation of GAVE - plan to hold heparin gtt at 0100 8/23 per consult. Using a lower heparin goal and will not bolus d/t recent GIB.   Goal of Therapy:  Heparin level 0.3-0.5 units/ml Monitor platelets by anticoagulation protocol: Yes   Plan:  Start heparin infusion at 1,200 units/hr  F/u with 8hr (>70y/o) heparin level Monitor daily CBC, signs of bleeding Follow up with EGD and plans for restarting oral anticoagulation   Mercy Riding, PharmD PGY1 Acute Care Pharmacy Resident Please refer to [facility] for unit-specific pharmacist

## 2020-01-14 NOTE — H&P (View-Only) (Signed)
    Progress Note   Subjective  Feeling well No GI complaint, no abdominal pain, no nausea or vomiting. No bowel movement since Friday which was dark at that time No chest pain or dyspnea   Objective  Vital signs in last 24 hours: Temp:  [98 F (36.7 C)-98.7 F (37.1 C)] 98.1 F (36.7 C) (08/22 1033) Pulse Rate:  [43-50] 43 (08/22 1033) Resp:  [16-18] 16 (08/22 1033) BP: (132-163)/(40-49) 163/49 (08/22 1033) SpO2:  [93 %-99 %] 98 % (08/22 1033) Weight:  [82.7 kg] 82.7 kg (08/22 0342) Last BM Date: 01/12/20  Gen: awake, alert, NAD HEENT: anicteric, op clear CV: brady, 2/6 sem Pulm: CTA b/l Abd: soft, NT/ND, +BS throughout Ext: no c/c/e Neuro: nonfocal   Intake/Output from previous day: 08/21 0701 - 08/22 0700 In: 77 [P.O.:960] Out: 3 [Urine:3] Intake/Output this shift: Total I/O In: 240 [P.O.:240] Out: -   Lab Results: Recent Labs    01/12/20 0749 01/13/20 0234 01/14/20 0544  WBC 5.0 4.2 4.1  HGB 10.1* 8.5* 9.5*  HCT 32.4* 27.6* 29.8*  PLT 214 179 180   BMET Recent Labs    01/12/20 0749 01/13/20 0234 01/14/20 0544  NA 137 138 140  K 4.2 4.0 4.0  CL 107 106 105  CO2 23 26 26   GLUCOSE 106* 200* 135*  BUN 11 11 12   CREATININE 1.32* 1.25* 1.30*  CALCIUM 8.4* 8.4* 8.7*   LFT Recent Labs    01/11/20 1136  PROT 5.3*  ALBUMIN 3.1*  AST 22  ALT 27  ALKPHOS 58  BILITOT 0.5   PT/INR Recent Labs    01/13/20 1048 01/14/20 0544  LABPROT 19.1* 17.1*  INR 1.7* 1.5*      Assessment & Recommendations  74 year old male with CAD status post recent PCI on Plavix until 01/10/2020, atrial fibrillation on warfarin and amiodarone, hypertension, diabetes and iron deficiency anemia admitted with symptomatic anemia and heme positive stool 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 GAVE/gastric polyps/IDA --hemoglobin has improved without additional transfusion from 8.5 to 9.5 g/dL.  He is feeling well.  He has been off Plavix for days now and warfarin also on  hold INR has drifted to 1.5.  We discussed repeat EGD tomorrow for further ablation of known GAVE (which was not done 2 days ago due to clopidogrel use and INR 2.0). --Plan upper endoscopy tomorrow with monitored anesthesia care for further APC ablation of GAVE.  Anticipate gastric polypectomy as well --He will continue follow-up with hematology as an outpatient and received IV iron as needed, I expect this will be less frequent now that we have identified most probable source of chronic blood loss --If he is started on heparin today this will need to be held at 1 AM for EGD tomorrow morning --N.p.o. after midnight except for sips with medications --Continue to hold Plavix which will likely not be restarted per cardiology and hold warfarin        LOS: 3 days   Jerene Bears  01/14/2020, 10:41 AM

## 2020-01-14 NOTE — Progress Notes (Signed)
Progress Note  Patient Name: Seth Hardy Date of Encounter: 01/14/2020  [facility] Cardiologist: Kathlyn Sacramento, MD   Subjective   Denies any chest pain or dyspnea.  Inpatient Medications    Scheduled Meds: . amiodarone  200 mg Oral QPM  . amLODipine  5 mg Oral Daily  . vitamin C  500 mg Oral QPM  . insulin aspart  0-9 Units Subcutaneous TID WC  . insulin glargine  15 Units Subcutaneous QHS  . isosorbide mononitrate  30 mg Oral Daily  . omega-3 acid ethyl esters  1 g Oral QHS  . pantoprazole  40 mg Oral BID AC  . ramipril  10 mg Oral Daily  . rosuvastatin  40 mg Oral q1800  . sodium chloride flush  3 mL Intravenous Q12H  . sodium chloride flush  3 mL Intravenous Q12H   Continuous Infusions: . sodium chloride     PRN Meds: sodium chloride, acetaminophen **OR** acetaminophen, albuterol, ALPRAZolam, nitroGLYCERIN, ondansetron **OR** ondansetron (ZOFRAN) IV, polyvinyl alcohol, sodium chloride flush   Vital Signs    Vitals:   01/13/20 1220 01/13/20 1935 01/14/20 0342 01/14/20 1033  BP: (!) 132/40 (!) 150/47 (!) 151/49 (!) 163/49  Pulse: (!) 46 (!) 50 (!) 45 (!) 43  Resp: 18 18 18 16   Temp: 98 F (36.7 C) 98.3 F (36.8 C) 98.7 F (37.1 C) 98.1 F (36.7 C)  TempSrc: Oral Oral Oral Oral  SpO2: 99% 93% 97% 98%  Weight:   82.7 kg   Height:        Intake/Output Summary (Last 24 hours) at 01/14/2020 1130 Last data filed at 01/14/2020 0900 Gross per 24 hour  Intake 960 ml  Output 2 ml  Net 958 ml   Last 3 Weights 01/14/2020 01/13/2020 01/12/2020  Weight (lbs) 182 lb 4.8 oz 181 lb 3.2 oz 180 lb  Weight (kg) 82.691 kg 82.192 kg 81.647 kg      Telemetry    Sinus bradycardia rate 40s to 50s- Personally Reviewed  ECG    01/11/2020-sinus bradycardia rate 51 with T wave inversions fairly diffusely, no change from prior- Personally Reviewed  Physical Exam   GEN: No acute distress.   Neck: No JVD Cardiac:  Bradycardic regular, 2/6 systolic murmur, rubs, or  gallops.  Respiratory: Clear to auscultation bilaterally. GI: Soft, nontender, non-distended  MS: No edema; No deformity. Neuro:  Nonfocal  Psych: Normal affect   Labs    High Sensitivity Troponin:   Recent Labs  Lab 12/26/19 0126 12/26/19 0715 12/26/19 1738 12/27/19 0008 01/11/20 1524  TROPONINIHS 102* 754* 809* 499* 13      Chemistry Recent Labs  Lab 01/11/20 1136 01/11/20 1136 01/12/20 0749 01/13/20 0234 01/14/20 0544  NA 135   < > 137 138 140  K 4.4   < > 4.2 4.0 4.0  CL 103   < > 107 106 105  CO2 23   < > 23 26 26   GLUCOSE 219*   < > 106* 200* 135*  BUN 16   < > 11 11 12   CREATININE 1.49*   < > 1.32* 1.25* 1.30*  CALCIUM 8.5*   < > 8.4* 8.4* 8.7*  PROT 5.3*  --   --   --   --   ALBUMIN 3.1*  --   --   --   --   AST 22  --   --   --   --   ALT 27  --   --   --   --  ALKPHOS 58  --   --   --   --   BILITOT 0.5  --   --   --   --   GFRNONAA 46*   < > 53* 57* 54*  GFRAA 53*   < > >60 >60 >60  ANIONGAP 9   < > 7 6 9    < > = values in this interval not displayed.     Hematology Recent Labs  Lab 01/12/20 0749 01/13/20 0234 01/14/20 0544  WBC 5.0 4.2 4.1  RBC 3.18* 2.67* 2.89*  HGB 10.1* 8.5* 9.5*  HCT 32.4* 27.6* 29.8*  MCV 101.9* 103.4* 103.1*  MCH 31.8 31.8 32.9  MCHC 31.2 30.8 31.9  RDW 20.8* 20.0* 19.1*  PLT 214 179 180    BNPNo results for input(s): BNP, PROBNP in the last 168 hours.   DDimer No results for input(s): DDIMER in the last 168 hours.   Radiology    No results found.  Cardiac Studies   Echocardiogram 12/26/2019-EF 82% grade 2 diastolic dysfunction mildly dilated left atrium mild to moderate aortic valve stenosis.  Patient Profile     74 y.o. male here with recurrent upper GI bleed, CAD post CABG, recent stent placement in April 2021.  Assessment & Plan    GI bleed-hemoglobin 6.2 on arrival.  Underwent an EGD yesterday, showed GAVE with bleeding, treated with APC with resolution of bleeding -Plavix and coumadin on  hold -INR 1.5, start heparin drip, OK per GI.  Repeat EGD planned for tomorrow for further ablation of GAVE -Likely plan to hold plavix moving forward.  Discussed with GI, okay with adding aspirin 81 mg daily in addition to heparin.  Continue to hold warfarin  Paroxysmal atrial fibrillation: on amiodarone, Coumadin.  Maintaining sinus rhythm.  CAD: s/p DES to RCA 08/4019.  Was on Plavix and Coumadin, holding both in setting of GI bleeding.  Likely plan on aspirin plus coumadin moving forward  Aortic stenosis: Mild to moderate on echo 12/26/2019  For questions or updates, please contact [place] Please consult [email or web address] for contact info under        Signed, Donato Heinz, MD  01/14/2020, 11:30 AM

## 2020-01-15 ENCOUNTER — Encounter (HOSPITAL_COMMUNITY)
Admission: EM | Disposition: A | Discharge status: Home / Self Care | Payer: Self-pay | Source: Home / Self Care | Attending: Internal Medicine

## 2020-01-15 ENCOUNTER — Inpatient Hospital Stay: Payer: PPO

## 2020-01-15 ENCOUNTER — Encounter (HOSPITAL_COMMUNITY): Payer: Self-pay | Admitting: Internal Medicine

## 2020-01-15 ENCOUNTER — Inpatient Hospital Stay (HOSPITAL_COMMUNITY): Payer: PPO | Admitting: Anesthesiology

## 2020-01-15 DIAGNOSIS — R001 Bradycardia, unspecified: Secondary | ICD-10-CM

## 2020-01-15 HISTORY — PX: POLYPECTOMY: SHX5525

## 2020-01-15 HISTORY — PX: HOT HEMOSTASIS: SHX5433

## 2020-01-15 HISTORY — PX: HEMOSTASIS CLIP PLACEMENT: SHX6857

## 2020-01-15 HISTORY — PX: ESOPHAGOGASTRODUODENOSCOPY (EGD) WITH PROPOFOL: SHX5813

## 2020-01-15 LAB — GLUCOSE, CAPILLARY
Glucose-Capillary: 154 mg/dL — ABNORMAL HIGH (ref 70–99)
Glucose-Capillary: 168 mg/dL — ABNORMAL HIGH (ref 70–99)
Glucose-Capillary: 282 mg/dL — ABNORMAL HIGH (ref 70–99)
Glucose-Capillary: 323 mg/dL — ABNORMAL HIGH (ref 70–99)

## 2020-01-15 LAB — CBC
HCT: 35.5 % — ABNORMAL LOW (ref 39.0–52.0)
Hemoglobin: 11.1 g/dL — ABNORMAL LOW (ref 13.0–17.0)
MCH: 32.5 pg (ref 26.0–34.0)
MCHC: 31.3 g/dL (ref 30.0–36.0)
MCV: 103.8 fL — ABNORMAL HIGH (ref 80.0–100.0)
Platelets: 207 10*3/uL (ref 150–400)
RBC: 3.42 MIL/uL — ABNORMAL LOW (ref 4.22–5.81)
RDW: 18.1 % — ABNORMAL HIGH (ref 11.5–15.5)
WBC: 2.8 10*3/uL — ABNORMAL LOW (ref 4.0–10.5)
nRBC: 0 % (ref 0.0–0.2)

## 2020-01-15 LAB — PROTIME-INR
INR: 1.2 (ref 0.8–1.2)
Prothrombin Time: 14.9 seconds (ref 11.4–15.2)

## 2020-01-15 SURGERY — ESOPHAGOGASTRODUODENOSCOPY (EGD) WITH PROPOFOL
Anesthesia: Monitor Anesthesia Care

## 2020-01-15 MED ORDER — PROPOFOL 10 MG/ML IV BOLUS
INTRAVENOUS | Status: DC | PRN
  Administered 2020-01-15: 20 mg via INTRAVENOUS

## 2020-01-15 MED ORDER — PROPOFOL 500 MG/50ML IV EMUL
INTRAVENOUS | Status: DC | PRN
  Administered 2020-01-15: 100 ug/kg/min via INTRAVENOUS

## 2020-01-15 MED ORDER — HEPARIN (PORCINE) 25000 UT/250ML-% IV SOLN
1100.0000 [IU]/h | INTRAVENOUS | Status: DC
  Administered 2020-01-15 – 2020-01-16 (×2): 1200 [IU]/h via INTRAVENOUS
  Filled 2020-01-15: qty 250

## 2020-01-15 MED ORDER — LIDOCAINE 2% (20 MG/ML) 5 ML SYRINGE
INTRAMUSCULAR | Status: DC | PRN
  Administered 2020-01-15: 20 mg via INTRAVENOUS
  Administered 2020-01-15: 60 mg via INTRAVENOUS

## 2020-01-15 MED ORDER — LACTATED RINGERS IV SOLN: INTRAVENOUS | Status: DC | PRN

## 2020-01-15 MED ORDER — INSULIN ASPART 100 UNIT/ML ~~LOC~~ SOLN: 0.0000 [IU] | Freq: Three times a day (TID) | SUBCUTANEOUS | Status: DC

## 2020-01-15 MED ORDER — INSULIN ASPART 100 UNIT/ML ~~LOC~~ SOLN
0.0000 [IU] | Freq: Every day | SUBCUTANEOUS | Status: DC
  Administered 2020-01-15: 4 [IU] via SUBCUTANEOUS

## 2020-01-15 SURGICAL SUPPLY — 15 items

## 2020-01-15 NOTE — Anesthesia Preprocedure Evaluation (Signed)
Anesthesia Evaluation  Patient identified by MRN, date of birth, ID band Patient awake    Reviewed: Allergy & Precautions, NPO status , Patient's Chart, lab work & pertinent test results, reviewed documented beta blocker date and time   Airway Mallampati: II  TM Distance: >3 FB Neck ROM: Full    Dental  (+) Caps, Chipped   Pulmonary shortness of breath, with exertion, at rest and Long-Term Oxygen Therapy, sleep apnea and Continuous Positive Airway Pressure Ventilation ,    breath sounds clear to auscultation (-) decreased breath sounds      Cardiovascular hypertension, Pt. on medications and Pt. on home beta blockers + angina with exertion + CAD, + Past MI, + Cardiac Stents and + CABG  + dysrhythmias Atrial Fibrillation + Valvular Problems/Murmurs AS  Rhythm:Irregular Rate:Normal + Systolic murmurs    Neuro/Psych negative neurological ROS  negative psych ROS   GI/Hepatic Neg liver ROS, PUD, GERD  Medicated and Controlled,Heme + stools   Endo/Other  diabetes, Well Controlled, Type 2, Oral Hypoglycemic Agents, Insulin DependentHyperlipidemia  Renal/GU Renal InsufficiencyRenal disease   ED    Musculoskeletal negative musculoskeletal ROS (+)   Abdominal   Peds  Hematology  (+) anemia , IDA Plavix -last dose 8/19 Coumadin- last dose 8/17   Anesthesia Other Findings   Reproductive/Obstetrics                             Anesthesia Physical  Anesthesia Plan  ASA: III  Anesthesia Plan: MAC   Post-op Pain Management:    Induction: Intravenous  PONV Risk Score and Plan: 1 and Propofol infusion and Treatment may vary due to age or medical condition  Airway Management Planned: Natural Airway, Nasal Cannula and Simple Face Mask  Additional Equipment:   Intra-op Plan:   Post-operative Plan:   Informed Consent: I have reviewed the patients History and Physical, chart, labs and discussed  the procedure including the risks, benefits and alternatives for the proposed anesthesia with the patient or authorized representative who has indicated his/her understanding and acceptance.     Dental advisory given  Plan Discussed with: CRNA and Anesthesiologist  Anesthesia Plan Comments:         Anesthesia Quick Evaluation

## 2020-01-15 NOTE — Anesthesia Postprocedure Evaluation (Signed)
Anesthesia Post Note  Patient: Seth Hardy  Procedure(s) Performed: ESOPHAGOGASTRODUODENOSCOPY (EGD) WITH PROPOFOL (N/A ) POLYPECTOMY HEMOSTASIS CLIP PLACEMENT     Patient location during evaluation: PACU Anesthesia Type: MAC Level of consciousness: awake and alert Pain management: pain level controlled Vital Signs Assessment: post-procedure vital signs reviewed and stable Respiratory status: spontaneous breathing, nonlabored ventilation, respiratory function stable and patient connected to nasal cannula oxygen Cardiovascular status: stable and blood pressure returned to baseline Postop Assessment: no apparent nausea or vomiting Anesthetic complications: no   No complications documented.  Last Vitals:  Vitals:   01/15/20 0857 01/15/20 0908  BP: (!) 153/47 (!) 167/51  Pulse: (!) 41 (!) 42  Resp: 14 16  Temp:    SpO2: 96% 94%    Last Pain:  Vitals:   01/15/20 0908  TempSrc:   PainSc: 0-No pain                 Merlinda Frederick

## 2020-01-15 NOTE — Op Note (Addendum)
[facility] Patient Name: Seth Hardy Procedure Date : 01/15/2020 MRN: [id number] Attending MD: Ladene Artist , MD Date of Birth: 02-17-1946 CSN: [id number] Age: 74 Admit Type: Inpatient Procedure:                Upper GI endoscopy Indications:              Therapeutic procedure for gastric polypectomies and                            GAVE ablation, Iron deficiency anemia, Melena Providers:                Pricilla Riffle. Fuller Plan, MD, Doristine Johns, RN, Laverda Sorenson, Technician, Claybon Jabs CRNA, CRNA Referring MD:             [facility] Medicines:                Monitored Anesthesia Care Complications:            No immediate complications. Estimated Blood Loss:     Estimated blood loss was minimal. Procedure:                Pre-Anesthesia Assessment:                           - Prior to the procedure, a History and Physical                            was performed, and patient medications and                            allergies were reviewed. [name] tolerance of                            previous anesthesia was also reviewed. The risks                            and benefits of the procedure and the sedation                            options and risks were discussed with the patient.                            All questions were answered, and informed consent                            was obtained. Prior Anticoagulants: The patient has                            taken warfarin and clopridogrel last doses were 5                            days prior to procedure. IV heparin was stopped 6  hours prior to the procedure. ASA Grade Assessment:                            III - A patient with severe systemic disease. After                            reviewing the risks and benefits, the patient was                            deemed in satisfactory condition to undergo the                            procedure.                            After obtaining informed consent, the endoscope was                            passed under direct vision. Throughout the                            procedure, the patient's blood pressure, pulse, and                            oxygen saturations were monitored continuously. The                            GIF-H190 ([id number] Olympus gastroscope was                            introduced through the mouth, and advanced to the                            second part of duodenum. The upper GI endoscopy was                            accomplished without difficulty. The patient                            tolerated the procedure well. Scope In: Scope Out: Findings:      The examined esophagus was normal.      Two 5 to 8 mm semi-pedunculated polyps with no bleeding and stigmata of       recent bleeding were found in the cardia and on the greater curvature of       the stomach respectively. The polyps were removed with a hot snare.       Resection and retrieval were complete. To prevent bleeding after the       polypectomy, two hemostatic clips were successfully placed (MR       conditional) at the greater curvature polypectomy site. Attempts made to       clip the cardia polypectomy site to prevent bleeding after the       polypectomy were unsuccessful as adequate positioning could not be       acheived in the retroflexed position and the  site was not seen in       forward view position. There was no bleeding at the end of the procedure.      Moderate gastric antral vascular ectasia with mild focal bleeding was       present in the gastric antrum and in the prepyloric region of the       stomach. Fulguration to ablate the lesion to prevent bleeding and stop       mild focal bleeding by argon plasma at 1 liter/minute and 20 watts was       successful (gastric settings). Estimated blood loss was minimal.      The exam of the stomach was otherwise normal.      The duodenal bulb and  second portion of the duodenum were normal. Impression:               - Normal esophagus.                           - Two gastric polyps. Resected and retrieved. Clips                            (MR conditional) were placed at greater curvature                            polypectomy site. No clips placed at cardia                            polypectomy site.                           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 Treated with argon plasma coagulation                            (APC).                           - Normal duodenal bulb and second portion of the                            duodenum. Recommendation:           - Return patient to hospital ward for ongoing care.                           - Resume previous diet.                           - Continue present medications including                            pantoprazole 40 mg po bid for at least 1 month.                           - Resume Coumadin (warfarin), Plavix (clopidogrel)  and ASA at prior doses tomorrow. OK to resume IV                            heparin in 6 hours as indicated. Refer to managing                            physician for further adjustment of therapy.                           - Outpatient GI follow up with Dr. Silvano Rusk. Procedure Code(s):        --- Professional ---                           [phone], 59, Esophagogastroduodenoscopy, flexible,                            transoral; with control of bleeding, any method                           43251, Esophagogastroduodenoscopy, flexible,                            transoral; with removal of tumor(s), polyp(s), or                            other lesion(s) by snare technique Diagnosis Code(s):        --- Professional ---                           K31.7, Polyp of stomach and duodenum                           K31.811, Angiodysplasia of stomach and duodenum                             with bleeding                           D50.9, Iron deficiency anemia, unspecified                           K92.1, Melena (includes Hematochezia) CPT copyright 2019 [employer]. All rights reserved. The codes documented in this report are preliminary and upon coder review may  be revised to meet current compliance requirements. Ladene Artist, MD 01/15/2020 9:01:18 AM This report has been signed electronically. Number of Addenda: 0

## 2020-01-15 NOTE — Progress Notes (Signed)
Progress Note  Patient Name: Seth Hardy Date of Encounter: 01/15/2020  [facility] Cardiologist: Kathlyn Sacramento, MD   Subjective   Denies any chest pain or dyspnea.  Underwent repeat EGD today for further ablation of GAVE  Inpatient Medications    Scheduled Meds: . amiodarone  200 mg Oral QPM  . amLODipine  5 mg Oral Daily  . vitamin C  500 mg Oral QPM  . aspirin EC  81 mg Oral Daily  . insulin aspart  0-9 Units Subcutaneous TID WC  . insulin glargine  15 Units Subcutaneous QHS  . isosorbide mononitrate  30 mg Oral Daily  . omega-3 acid ethyl esters  1 g Oral QHS  . pantoprazole  40 mg Oral BID AC  . polyethylene glycol  17 g Oral Daily  . ramipril  10 mg Oral Daily  . rosuvastatin  40 mg Oral q1800  . sodium chloride flush  3 mL Intravenous Q12H  . sodium chloride flush  3 mL Intravenous Q12H   Continuous Infusions: . sodium chloride     PRN Meds: sodium chloride, acetaminophen **OR** acetaminophen, albuterol, ALPRAZolam, nitroGLYCERIN, ondansetron **OR** ondansetron (ZOFRAN) IV, polyvinyl alcohol, sodium chloride flush   Vital Signs    Vitals:   01/15/20 0735 01/15/20 0847 01/15/20 0857 01/15/20 0908  BP: (!) 183/43 (!) 136/41 (!) 153/47 (!) 167/51  Pulse:  (!) 44 (!) 41 (!) 42  Resp: 18 15 14 16   Temp: 98.3 F (36.8 C) 97.7 F (36.5 C)    TempSrc: Oral Oral    SpO2: 96% 99% 96% 94%  Weight:      Height:        Intake/Output Summary (Last 24 hours) at 01/15/2020 1014 Last data filed at 01/15/2020 0849 Gross per 24 hour  Intake 691.62 ml  Output 1026 ml  Net -334.38 ml   Last 3 Weights 01/15/2020 01/14/2020 01/13/2020  Weight (lbs) 181 lb 182 lb 4.8 oz 181 lb 3.2 oz  Weight (kg) 82.101 kg 82.691 kg 82.192 kg      Telemetry    Sinus bradycardia rate 40s to 50s- Personally Reviewed  ECG    01/11/2020-sinus bradycardia rate 51 with T wave inversions fairly diffusely, no change from prior- Personally Reviewed  Physical Exam   GEN: No acute  distress.   Neck: No JVD Cardiac:  Bradycardic regular, 2/6 systolic murmur, rubs, or gallops.  Respiratory: Clear to auscultation bilaterally. GI: Soft, nontender, non-distended  MS: No edema; No deformity. Neuro:  Nonfocal  Psych: Normal affect   Labs    High Sensitivity Troponin:   Recent Labs  Lab 12/26/19 0126 12/26/19 0715 12/26/19 1738 12/27/19 0008 01/11/20 1524  TROPONINIHS 102* 754* 809* 499* 13      Chemistry Recent Labs  Lab 01/11/20 1136 01/11/20 1136 01/12/20 0749 01/13/20 0234 01/14/20 0544  NA 135   < > 137 138 140  K 4.4   < > 4.2 4.0 4.0  CL 103   < > 107 106 105  CO2 23   < > 23 26 26   GLUCOSE 219*   < > 106* 200* 135*  BUN 16   < > 11 11 12   CREATININE 1.49*   < > 1.32* 1.25* 1.30*  CALCIUM 8.5*   < > 8.4* 8.4* 8.7*  PROT 5.3*  --   --   --   --   ALBUMIN 3.1*  --   --   --   --   AST 22  --   --   --   --  ALT 27  --   --   --   --   ALKPHOS 58  --   --   --   --   BILITOT 0.5  --   --   --   --   GFRNONAA 46*   < > 53* 57* 54*  GFRAA 53*   < > >60 >60 >60  ANIONGAP 9   < > 7 6 9    < > = values in this interval not displayed.     Hematology Recent Labs  Lab 01/12/20 0749 01/13/20 0234 01/14/20 0544  WBC 5.0 4.2 4.1  RBC 3.18* 2.67* 2.89*  HGB 10.1* 8.5* 9.5*  HCT 32.4* 27.6* 29.8*  MCV 101.9* 103.4* 103.1*  MCH 31.8 31.8 32.9  MCHC 31.2 30.8 31.9  RDW 20.8* 20.0* 19.1*  PLT 214 179 180    BNPNo results for input(s): BNP, PROBNP in the last 168 hours.   DDimer No results for input(s): DDIMER in the last 168 hours.   Radiology    No results found.  Cardiac Studies   Echocardiogram 12/26/2019-EF 70% grade 2 diastolic dysfunction mildly dilated left atrium mild to moderate aortic valve stenosis.  Patient Profile     74 y.o. male here with recurrent upper GI bleed, CAD post CABG, recent stent placement in April 2021.  Assessment & Plan    GI bleed-hemoglobin 6.2 on arrival.  Underwent EGD, showed GAVE with bleeding,  treated with APC with resolution of bleeding.  Underwent second EGD today with further ablation of GAVE -Plavix and coumadin on hold -Can restart heparin gtt 6 hours post EGD -Continue ASA 81 mg daily -Likely plan to hold plavix moving forward, with plans for ASA plus warfarin  Paroxysmal atrial fibrillation: on amiodarone, Coumadin.  Maintaining sinus rhythm.  Bradycardic to 40s.  Appears to tolerate well.  Discussed with Dr Quentin Ore, planning PPM 9/3.    CAD: s/p DES to RCA 08/4019.  Was on Plavix and Coumadin, holding both in setting of GI bleeding.  Likely plan on aspirin plus coumadin moving forward  Aortic stenosis: Mild to moderate on echo 12/26/2019  For questions or updates, please contact [place] Please consult [email or web address] for contact info under        Signed, Donato Heinz, MD  01/15/2020, 10:14 AM

## 2020-01-15 NOTE — Interval H&P Note (Signed)
History and Physical Interval Note:  01/15/2020 8:04 AM  Seth Hardy  has presented today for surgery, with the diagnosis of Iron deficiency anemia, GAVE, gastric polyps.  The various methods of treatment have been discussed with the patient and family. After consideration of risks, benefits and other options for treatment, the patient has consented to  Procedure(s): ESOPHAGOGASTRODUODENOSCOPY (EGD) WITH PROPOFOL (N/A) as a surgical intervention.  [name] history has been reviewed, patient examined, no change in status, stable for surgery.  I have reviewed the patient's chart and labs.  Questions were answered to the patient's satisfaction.     Pricilla Riffle. Fuller Plan

## 2020-01-15 NOTE — Progress Notes (Signed)
ANTICOAGULATION CONSULT NOTE  Pharmacy Consult for heparin Indication: atrial fibrillation  No Known Allergies  Patient Measurements: Height: 5\' 8"  (172.7 cm) Weight: 82.1 kg (181 lb) (scale c) IBW/kg (Calculated) : 68.4 Heparin Dosing Weight: 81.6  Vital Signs: Temp: 97.7 F (36.5 C) (08/23 0847) Temp Source: Oral (08/23 0847) BP: 167/51 (08/23 0908) Pulse Rate: 42 (08/23 0908)  Labs: Recent Labs    01/13/20 0234 01/13/20 1048 01/14/20 0544  HGB 8.5*  --  9.5*  HCT 27.6*  --  29.8*  PLT 179  --  180  LABPROT  --  19.1* 17.1*  INR  --  1.7* 1.5*  CREATININE 1.25*  --  1.30*    Estimated Creatinine Clearance: 52.9 mL/min (A) (by C-G formula based on SCr of 1.3 mg/dL (H)).   Medical History: Past Medical History:  Diagnosis Date  . Basal cell carcinoma of skin 2012   removed several spots from arms and back  . CAD (coronary artery disease)    a. 01/2010 CABG x 4: LIMA->LAD, VG->D1, VG->OM1, VG->PDA. b. NSTEMI 02/2012 in setting of AF-RVR with cath s/p BMS to RCA (plan for 1 month, possibly up to 3 months of Plavix)  . Carotid disease, bilateral ([place])    a. 03/2011 U/S: 40-59% bilat Carotid dzs;  b. 04/2015 Carotid U/S: 40-59% bilat ICA stenosis; c. 05/2015 CTA Neck: 96% RICA, 75% LICA, mod-marked R vertebral stenosis, mod L vertebral stenosis.  . Chronic kidney disease    small obtusion per pcp. ultrasound done on 08/29/2015  . Diabetes mellitus type II, controlled ([place])    a. Variable CBG 02/2012 - several meds adjusted.  Marland Kitchen Dysrhythmia    intermittent Atrial Fibrillation  . GERD (gastroesophageal reflux disease)   . Heart murmur   . Hyperlipidemia   . Hypertension   . Hypertensive heart disease   . Iron deficiency anemia    "gets infusions q once in awhile" (04/27/2018)  . Iron deficiency anemia 08/2015   received 2 units rbc one week ago, 3 IV iron infusion -last 1 week.  . Macular edema bil   lazer work done previously  . Mild aortic stenosis    a. 03/2011  Echo: EF 55-60%, Mild AS. b. Mild by cath 02/2012; c. 01/2014 Echo: EF 65-70%, Gr 1 DD, mild AS, mildly dil LA.  . Multiple gastric ulcers 2006  . Myocardial infarction ([place]) 02/2012   stents (x1) at time  . Myocardial infarction [facility])    "2nd one was after 02/2012; don't know date" (04/27/2018)  . Obesity   . On home oxygen therapy    "2.5 w/CPAP" (04/27/2018)  . OSA on CPAP    CPAP settings 3 with oxygen 2.5 (04/27/2018)  . PAF (paroxysmal atrial fibrillation) ([place])    a. Newly dx 02/2012 & initiated on Coumadin (spont converted to NSR).  . Shortness of breath dyspnea   . Transfusion history    transfusions -1 month ago -2 units    Medications:  Infusions:  . sodium chloride      Assessment: Patient was on coumadin for afib PTA, INR on admission was 5.3 so warfarin has been held. He was found to have upper GIB w/ Hgb 6.2 on arrival, s/p transfusion. EGD showed GAVE w/ bleeding, treated with APC.  Heparin started 8/22 as bridge prior to repeat EGD 8/23. Pharmacy now asked to restart in 6h post/op, will likely resume warfarin 8/24.  Goal of Therapy:  Heparin level 0.3-0.5 units/ml Monitor platelets by anticoagulation protocol: Yes  Plan:  Heparin 1200 units/h no bolus at 1530 Check 8hr heparin level   Arrie Senate, PharmD, BCPS Clinical Pharmacist [phone] Please check AMION for all [facility] numbers 01/15/2020

## 2020-01-15 NOTE — Progress Notes (Signed)
PROGRESS NOTE    Seth Hardy  VHQ:[id number]  DOB: 1946-03-22  DOA: 01/11/2020 PCP: Rusty Aus, MD Outpatient Specialists:   Hospital course:  74 year old man with CAD status post CABG, PAF, mild to moderate AS and history of chronic GI bleed and melena was admitted 01/11/2020 for hemoglobin of 6.5 found on GI clinic where he presented for melena work-up.  Patient was hemodynamically stable on admission.  He was treated with 2 units PRBC.  He underwent EGD which showed gastric polyps as well as moderate GAVE (gastric antral vascular ectasia) with ongoing bleeding.  Patient was treated with argon plasma fulguration.   Subjective:  Patient feels he is doing well.  Tolerated second ablation procedure earlier today with no difficulty.  Would like to go home tomorrow.  No chest pain.  No shortness of breath.  Has not had a bowel movement in 2 days.  Patient's wife is very grateful to Dr. Posey Pronto at [facility] for making sure they paid attention to his GI bleed when he had been admitted there for his heart last week.   Objective: Vitals:   01/15/20 0847 01/15/20 0857 01/15/20 0908 01/15/20 1044  BP: (!) 136/41 (!) 153/47 (!) 167/51 (!) 151/50  Pulse: (!) 44 (!) 41 (!) 42 (!) 45  Resp: 15 14 16    Temp: 97.7 F (36.5 C)     TempSrc: Oral     SpO2: 99% 96% 94% 97%  Weight:      Height:        Intake/Output Summary (Last 24 hours) at 01/15/2020 1848 Last data filed at 01/15/2020 1251 Gross per 24 hour  Intake 1040 ml  Output 1025 ml  Net 15 ml   Filed Weights   01/13/20 0523 01/14/20 0342 01/15/20 0327  Weight: 82.2 kg 82.7 kg 82.1 kg     Exam:  General: Well-appearing gentleman sitting in chair looking at the computer while his wife was lying in bed watching TV. Eyes: sclera anicteric, conjuctiva mild injection bilaterally CVS: S1-S2, regular  Respiratory:  decreased air entry bilaterally secondary to decreased inspiratory effort, rales at bases  GI: NABS, soft, NT    LE: No edema.  Neuro: A/O x 3, Moving all extremities equally with normal strength, CN 3-12 intact, grossly nonfocal.  Psych: patient is logical and coherent, judgement and insight appear normal, mood and affect appropriate to situation.   Assessment & Plan:   Acute blood loss anemia secondary to GAVE  Patient tolerated his second ablation procedure well today. Hemoglobin is up to 11 from 9.5 yesterday. He has received 2 units PRBC on 8/20  Leukopenia WBC 2.8, unclear etiology. Repeat in the morning.  PAF On amiodarone for rate control On heparin as warfarin is being held Plan is to continue warfarin per GI upon discharge  CAD Status post DES April 2021 No chest pain or shortness of breath Continue aspirin Plavix being held and plan per cardiology is to continue to hold Plavix moving forward.  HTN Well-controlled on amlodipine and ramipril  DM2 Well controlled on present regimen  OSA Continue CPAP   DVT prophylaxis: Heparin Code Status: Full Family Communication: Patient's wife was at bedside throughout Disposition Plan:   Patient is from: Home  Anticipated Discharge Location: Home  Barriers to Discharge: Status post ablation today  Is patient medically stable for Discharge: Not yet   Consultants:  GI  Cardiology  Procedures:  Status post ablation of gave 01/12/2020 and today 01/15/2020  Antimicrobials:  None  Data Reviewed:  Basic Metabolic Panel: Recent Labs  Lab 01/11/20 1136 01/12/20 0749 01/13/20 0234 01/14/20 0544  NA 135 137 138 140  K 4.4 4.2 4.0 4.0  CL 103 107 106 105  CO2 23 23 26 26   GLUCOSE 219* 106* 200* 135*  BUN 16 11 11 12   CREATININE 1.49* 1.32* 1.25* 1.30*  CALCIUM 8.5* 8.4* 8.4* 8.7*  MG  --   --  2.2  --    Liver Function Tests: Recent Labs  Lab 01/11/20 1136  AST 22  ALT 27  ALKPHOS 58  BILITOT 0.5  PROT 5.3*  ALBUMIN 3.1*   No results for input(s): LIPASE, AMYLASE in the last 168 hours. No results for  input(s): AMMONIA in the last 168 hours. CBC: Recent Labs  Lab 01/11/20 0925 01/11/20 1136 01/11/20 2339 01/12/20 0749 01/13/20 0234 01/14/20 0544 01/15/20 1211  WBC 5.0   < > 4.5 5.0 4.2 4.1 2.8*  NEUTROABS 3.5  --   --   --   --   --   --   HGB 6.5 Repeated and verified X2.*   < > 9.1* 10.1* 8.5* 9.5* 11.1*  HCT 19.9 cL*   < > 28.7* 32.4* 27.6* 29.8* 35.5*  MCV 104.9*   < > 100.7* 101.9* 103.4* 103.1* 103.8*  PLT 216.0   < > 212 214 179 180 207   < > = values in this interval not displayed.   Cardiac Enzymes: No results for input(s): CKTOTAL, CKMB, CKMBINDEX, TROPONINI in the last 168 hours. BNP (last 3 results) No results for input(s): PROBNP in the last 8760 hours. CBG: Recent Labs  Lab 01/14/20 1624 01/14/20 2114 01/15/20 0614 01/15/20 1100 01/15/20 1619  GLUCAP 241* 229* 154* 168* 282*    Recent Results (from the past 240 hour(s))  SARS Coronavirus 2 by RT PCR (hospital order, performed in [facility] hospital lab) Nasopharyngeal Nasopharyngeal Swab     Status: None   Collection Time: 01/11/20  3:15 PM   Specimen: Nasopharyngeal Swab  Result Value Ref Range Status   SARS Coronavirus 2 NEGATIVE NEGATIVE Final    Comment: (NOTE) SARS-CoV-2 target nucleic acids are NOT DETECTED.  The SARS-CoV-2 RNA is generally detectable in upper and lower respiratory specimens during the acute phase of infection. The lowest concentration of SARS-CoV-2 viral copies this assay can detect is 250 copies / mL. A negative result does not preclude SARS-CoV-2 infection and should not be used as the sole basis for treatment or other patient management decisions.  A negative result may occur with improper specimen collection / handling, submission of specimen other than nasopharyngeal swab, presence of viral mutation(s) within the areas targeted by this assay, and inadequate number of viral copies (<250 copies / mL). A negative result must be combined with clinical observations,  patient history, and epidemiological information.  Fact Sheet for Patients:   StrictlyIdeas.no  Fact Sheet for Healthcare Providers: BankingDealers.co.za  This test is not yet approved or  cleared by the [place] FDA and has been authorized for detection and/or diagnosis of SARS-CoV-2 by FDA under an Emergency Use Authorization (EUA).  This EUA will remain in effect (meaning this test can be used) for the duration of the COVID-19 declaration under Section 564(b)(1) of the Act, 21 U.S.C. section 360bbb-3(b)(1), unless the authorization is terminated or revoked sooner.  Performed at [facility], [place] [address], [place], [place] [address]       Studies: No results found.   Scheduled  Meds: . amiodarone  200 mg Oral QPM  . amLODipine  5 mg Oral Daily  . vitamin C  500 mg Oral QPM  . aspirin EC  81 mg Oral Daily  . insulin aspart  0-9 Units Subcutaneous TID WC  . insulin glargine  15 Units Subcutaneous QHS  . isosorbide mononitrate  30 mg Oral Daily  . omega-3 acid ethyl esters  1 g Oral QHS  . pantoprazole  40 mg Oral BID AC  . polyethylene glycol  17 g Oral Daily  . ramipril  10 mg Oral Daily  . rosuvastatin  40 mg Oral q1800  . sodium chloride flush  3 mL Intravenous Q12H  . sodium chloride flush  3 mL Intravenous Q12H   Continuous Infusions: . sodium chloride    . heparin 1,200 Units/hr (01/15/20 1710)    Principal Problem:   UGI bleed Active Problems:   CAD, NATIVE VESSEL   Aortic valve stenosis, mild   Long term (current) use of anticoagulants   Atrial fibrillation (HCC)   Hyperlipidemia   Diabetes mellitus type II, controlled ([place])   Iron deficiency anemia due to chronic blood loss   Gastric polyps   Benign essential hypertension   Heme positive stool   Melena   GAVE (gastric antral vascular ectasia)     Dewaine Oats Derek Jack, [facility]  If 7PM-7AM, please contact  night-coverage [email or web address] Password [facility] 01/15/2020, 6:48 PM    LOS: 4 days

## 2020-01-15 NOTE — Transfer of Care (Signed)
Immediate Anesthesia Transfer of Care Note  Patient: Seth Hardy  Procedure(s) Performed: ESOPHAGOGASTRODUODENOSCOPY (EGD) WITH PROPOFOL (N/A ) POLYPECTOMY HEMOSTASIS CLIP PLACEMENT  Patient Location: Endoscopy Unit  Anesthesia Type:MAC  Level of Consciousness: drowsy and patient cooperative  Airway & Oxygen Therapy: Patient Spontanous Breathing and Patient connected to nasal cannula oxygen  Post-op Assessment: Report given to RN and Post -op Vital signs reviewed and stable  Post vital signs: Reviewed and stable  Last Vitals:  Vitals Value Taken Time  BP 136/41 01/15/20 0847  Temp    Pulse 43 01/15/20 0849  Resp 13 01/15/20 0849  SpO2 100 % 01/15/20 0849  Vitals shown include unvalidated device data.  Last Pain:  Vitals:   01/15/20 0847  TempSrc:   PainSc: 0-No pain         Complications: No complications documented.

## 2020-01-15 NOTE — Anesthesia Procedure Notes (Signed)
Procedure Name: MAC Date/Time: 01/15/2020 8:07 AM Performed by: Moshe Salisbury, CRNA Pre-anesthesia Checklist: Patient identified, Emergency Drugs available, Suction available, Patient being monitored and Timeout performed Patient Re-evaluated:Patient Re-evaluated prior to induction Oxygen Delivery Method: Nasal cannula Placement Confirmation: positive ETCO2 Dental Injury: Teeth and Oropharynx as per pre-operative assessment

## 2020-01-16 LAB — BASIC METABOLIC PANEL
Anion gap: 11 (ref 5–15)
BUN: 12 mg/dL (ref 8–23)
CO2: 25 mmol/L (ref 22–32)
Calcium: 9.3 mg/dL (ref 8.9–10.3)
Chloride: 104 mmol/L (ref 98–111)
Creatinine, Ser: 1.32 mg/dL — ABNORMAL HIGH (ref 0.61–1.24)
GFR calc Af Amer: >60 mL/min (ref >60)
GFR calc non Af Amer: 53 mL/min — ABNORMAL LOW (ref >60)
Glucose, Bld: 143 mg/dL — ABNORMAL HIGH (ref 70–99)
Potassium: 4.7 mmol/L (ref 3.5–5.1)
Sodium: 140 mmol/L (ref 135–145)

## 2020-01-16 LAB — PROTIME-INR
INR: 1.2 (ref 0.8–1.2)
Prothrombin Time: 14.4 seconds (ref 11.4–15.2)

## 2020-01-16 LAB — GLUCOSE, CAPILLARY
Glucose-Capillary: 137 mg/dL — ABNORMAL HIGH (ref 70–99)
Glucose-Capillary: 138 mg/dL — ABNORMAL HIGH (ref 70–99)
Glucose-Capillary: 283 mg/dL — ABNORMAL HIGH (ref 70–99)

## 2020-01-16 LAB — CBC
HCT: 37.1 % — ABNORMAL LOW (ref 39.0–52.0)
Hemoglobin: 11.6 g/dL — ABNORMAL LOW (ref 13.0–17.0)
MCH: 32.1 pg (ref 26.0–34.0)
MCHC: 31.3 g/dL (ref 30.0–36.0)
MCV: 102.8 fL — ABNORMAL HIGH (ref 80.0–100.0)
Platelets: 222 10*3/uL (ref 150–400)
RBC: 3.61 MIL/uL — ABNORMAL LOW (ref 4.22–5.81)
RDW: 17.5 % — ABNORMAL HIGH (ref 11.5–15.5)
WBC: 5.3 10*3/uL (ref 4.0–10.5)
nRBC: 0 % (ref 0.0–0.2)

## 2020-01-16 LAB — SURGICAL PATHOLOGY

## 2020-01-16 LAB — HEPARIN LEVEL (UNFRACTIONATED): Heparin Unfractionated: 0.59 IU/mL (ref 0.30–0.70)

## 2020-01-16 MED ORDER — WARFARIN - PHARMACIST DOSING INPATIENT: Freq: Every day | Status: DC

## 2020-01-16 MED ORDER — ASPIRIN 81 MG PO TBEC: 81.0000 mg | DELAYED_RELEASE_TABLET | Freq: Every day | ORAL | 11 refills | Status: AC

## 2020-01-16 MED ORDER — WARFARIN SODIUM 2.5 MG PO TABS: 2.5000 mg | ORAL_TABLET | Freq: Once | ORAL | Status: DC

## 2020-01-16 NOTE — Progress Notes (Signed)
D/C instructions given and reviewed. Questions answered and encouraged to call with any f/u concerns.

## 2020-01-16 NOTE — Progress Notes (Signed)
Progress Note  Patient Name: Seth Hardy Date of Encounter: 01/16/2020  [facility] Cardiologist: Kathlyn Sacramento, MD   Subjective   Denies any chest pain, dyspnea, or further bleeding.  Inpatient Medications    Scheduled Meds: . amiodarone  200 mg Oral QPM  . amLODipine  5 mg Oral Daily  . vitamin C  500 mg Oral QPM  . aspirin EC  81 mg Oral Daily  . insulin aspart  0-5 Units Subcutaneous QHS  . insulin aspart  0-9 Units Subcutaneous TID WC  . insulin glargine  15 Units Subcutaneous QHS  . isosorbide mononitrate  30 mg Oral Daily  . omega-3 acid ethyl esters  1 g Oral QHS  . pantoprazole  40 mg Oral BID AC  . polyethylene glycol  17 g Oral Daily  . ramipril  10 mg Oral Daily  . rosuvastatin  40 mg Oral q1800  . sodium chloride flush  3 mL Intravenous Q12H  . sodium chloride flush  3 mL Intravenous Q12H   Continuous Infusions: . sodium chloride    . heparin 1,200 Units/hr (01/16/20 0323)   PRN Meds: sodium chloride, acetaminophen **OR** acetaminophen, albuterol, ALPRAZolam, nitroGLYCERIN, ondansetron **OR** ondansetron (ZOFRAN) IV, polyvinyl alcohol, sodium chloride flush   Vital Signs    Vitals:   01/15/20 1044 01/15/20 1941 01/16/20 0437 01/16/20 0832  BP: (!) 151/50 (!) 165/53 (!) 160/55 (!) 145/51  Pulse: (!) 45 (!) 49 (!) 51   Resp:  18 18   Temp:  99 F (37.2 C) 99.1 F (37.3 C)   TempSrc:  Oral Oral   SpO2: 97% 95% 95%   Weight:   81.6 kg   Height:        Intake/Output Summary (Last 24 hours) at 01/16/2020 0844 Last data filed at 01/16/2020 0600 Gross per 24 hour  Intake 1413.73 ml  Output 1800 ml  Net -386.27 ml   Last 3 Weights 01/16/2020 01/15/2020 01/14/2020  Weight (lbs) 179 lb 14.4 oz 181 lb 182 lb 4.8 oz  Weight (kg) 81.602 kg 82.101 kg 82.691 kg      Telemetry    Sinus bradycardia rate 40s to 50s- Personally Reviewed  ECG    No new ekg- Personally Reviewed  Physical Exam   GEN: No acute distress.   Neck: No JVD Cardiac:   Bradycardic regular, 2/6 systolic murmur, rubs, or gallops.  Respiratory: Clear to auscultation bilaterally. GI: Soft, nontender, non-distended  MS: No edema; No deformity. Neuro:  Nonfocal  Psych: Normal affect   Labs    High Sensitivity Troponin:   Recent Labs  Lab 12/26/19 0126 12/26/19 0715 12/26/19 1738 12/27/19 0008 01/11/20 1524  TROPONINIHS 102* 754* 809* 499* 13      Chemistry Recent Labs  Lab 01/11/20 1136 01/12/20 0749 01/13/20 0234 01/14/20 0544 01/16/20 0746  NA 135   < > 138 140 140  K 4.4   < > 4.0 4.0 4.7  CL 103   < > 106 105 104  CO2 23   < > 26 26 25   GLUCOSE 219*   < > 200* 135* 143*  BUN 16   < > 11 12 12   CREATININE 1.49*   < > 1.25* 1.30* 1.32*  CALCIUM 8.5*   < > 8.4* 8.7* 9.3  PROT 5.3*  --   --   --   --   ALBUMIN 3.1*  --   --   --   --   AST 22  --   --   --   --  ALT 27  --   --   --   --   ALKPHOS 58  --   --   --   --   BILITOT 0.5  --   --   --   --   GFRNONAA 46*   < > 57* 54* 53*  GFRAA 53*   < > >60 >60 >60  ANIONGAP 9   < > 6 9 11    < > = values in this interval not displayed.     Hematology Recent Labs  Lab 01/14/20 0544 01/15/20 1211 01/16/20 0746  WBC 4.1 2.8* 5.3  RBC 2.89* 3.42* 3.61*  HGB 9.5* 11.1* 11.6*  HCT 29.8* 35.5* 37.1*  MCV 103.1* 103.8* 102.8*  MCH 32.9 32.5 32.1  MCHC 31.9 31.3 31.3  RDW 19.1* 18.1* 17.5*  PLT 180 207 222    BNPNo results for input(s): BNP, PROBNP in the last 168 hours.   DDimer No results for input(s): DDIMER in the last 168 hours.   Radiology    No results found.  Cardiac Studies   Echocardiogram 12/26/2019-EF 24% grade 2 diastolic dysfunction mildly dilated left atrium mild to moderate aortic valve stenosis.  Patient Profile     74 y.o. male here with recurrent upper GI bleed, CAD post CABG, recent stent placement in April 2021.  Assessment & Plan    GI bleed-hemoglobin 6.2 on arrival.  Underwent EGD, showed GAVE with bleeding, treated with APC with resolution of  bleeding.  Underwent second EGD yesterday with further ablation of GAVE.  Hgb stable, up to 11.6 -Continue ASA 81 mg daily -D/w patient's cardiologist, Dr Fletcher Anon, will plan for aspirin plus coumadin moving forward.  Will restart coumadin today  Paroxysmal atrial fibrillation: on amiodarone, Coumadin.  Maintaining sinus rhythm.  Bradycardic to 40s.  Appears to tolerate well.  Discussed with Dr Quentin Ore, planning PPM 9/3.    CAD: s/p DES to RCA 08/4019.  Was on Plavix and Coumadin, holding both in setting of GI bleeding.  Plan on aspirin plus coumadin moving forward  Aortic stenosis: Mild to moderate on echo 12/26/2019  [facility] will sign off.   Medication Recommendations:  ASA 81 mg daily, warfarin per pharmacy, amiodarone 200 mg daily.  Continue home antihypertensives, statin Other recommendations (labs, testing, etc):  None Follow up as an outpatient:  Will need f/u in coumadin clinic this week for INR check.  Will schedule f/u with Dr Fletcher Anon   For questions or updates, please contact [place] Please consult [email or web address] for contact info under        Signed, Donato Heinz, MD  01/16/2020, 8:44 AM

## 2020-01-16 NOTE — Progress Notes (Addendum)
[place] for heparin + warfarin Indication: atrial fibrillation  No Known Allergies  Patient Measurements: Height: 5\' 8"  (172.7 cm) Weight: 81.6 kg (179 lb 14.4 oz) (scale b) IBW/kg (Calculated) : 68.4 Heparin Dosing Weight: 81.6  Vital Signs: Temp: 99.1 F (37.3 C) (08/24 0437) Temp Source: Oral (08/24 0437) BP: 145/51 (08/24 0832) Pulse Rate: 51 (08/24 0437)  Labs: Recent Labs    01/14/20 0544 01/14/20 0544 01/15/20 1211 01/16/20 0746  HGB 9.5*   < > 11.1* 11.6*  HCT 29.8*  --  35.5* 37.1*  PLT 180  --  207 222  LABPROT 17.1*  --  14.9 14.4  INR 1.5*  --  1.2 1.2  HEPARINUNFRC  --   --   --  0.59  CREATININE 1.30*  --   --  1.32*   < > = values in this interval not displayed.    Estimated Creatinine Clearance: 48.2 mL/min (A) (by C-G formula based on SCr of 1.32 mg/dL (H)).   Medical History: Past Medical History:  Diagnosis Date  . Basal cell carcinoma of skin 2012   removed several spots from arms and back  . CAD (coronary artery disease)    a. 01/2010 CABG x 4: LIMA->LAD, VG->D1, VG->OM1, VG->PDA. b. NSTEMI 02/2012 in setting of AF-RVR with cath s/p BMS to RCA (plan for 1 month, possibly up to 3 months of Plavix)  . Carotid disease, bilateral ([place])    a. 03/2011 U/S: 40-59% bilat Carotid dzs;  b. 04/2015 Carotid U/S: 40-59% bilat ICA stenosis; c. 05/2015 CTA Neck: 91% RICA, 47% LICA, mod-marked R vertebral stenosis, mod L vertebral stenosis.  . Chronic kidney disease    small obtusion per pcp. ultrasound done on 08/29/2015  . Diabetes mellitus type II, controlled ([place])    a. Variable CBG 02/2012 - several meds adjusted.  Marland Kitchen Dysrhythmia    intermittent Atrial Fibrillation  . GERD (gastroesophageal reflux disease)   . Heart murmur   . Hyperlipidemia   . Hypertension   . Hypertensive heart disease   . Iron deficiency anemia    "gets infusions q once in awhile" (04/27/2018)  . Iron deficiency anemia 08/2015   received 2  units rbc one week ago, 3 IV iron infusion -last 1 week.  . Macular edema bil   lazer work done previously  . Mild aortic stenosis    a. 03/2011 Echo: EF 55-60%, Mild AS. b. Mild by cath 02/2012; c. 01/2014 Echo: EF 65-70%, Gr 1 DD, mild AS, mildly dil LA.  . Multiple gastric ulcers 2006  . Myocardial infarction ([place]) 02/2012   stents (x1) at time  . Myocardial infarction [facility])    "2nd one was after 02/2012; don't know date" (04/27/2018)  . Obesity   . On home oxygen therapy    "2.5 w/CPAP" (04/27/2018)  . OSA on CPAP    CPAP settings 3 with oxygen 2.5 (04/27/2018)  . PAF (paroxysmal atrial fibrillation) ([place])    a. Newly dx 02/2012 & initiated on Coumadin (spont converted to NSR).  . Shortness of breath dyspnea   . Transfusion history    transfusions -1 month ago -2 units    Medications:  Infusions:  . sodium chloride    . heparin 1,200 Units/hr (01/16/20 0323)    Assessment: Patient was on coumadin for afib PTA, INR on admission was 5.3 so warfarin has been held. He was found to have upper GIB w/ Hgb 6.2 on arrival, s/p transfusion. EGD showed  GAVE w/ bleeding, treated with APC. INR down to 1.2. Heparin was started 8/22 as bridge prior to repeat EGD 8/23. Pharmacy now asked to restart in 6h post/op EGD, will likely resume warfarin 8/24.  Heparin level 0.59 (delayed as phlebotomy had difficult time obtaining level from patient) - slightly above goal of 0.3 to 0.5. CBC is stable. Bruising noted. No overt bleeding reported. Will reduce IV Heparin rate and f/up plan for warfarin restart.   Addendum: Orders received to resume Warfarin today per Dr. Gardiner Rhyme, Cardiology. Plan for aspirin plus Warfarin as long-term anticoagulation. Home warfarin regimen was adjusted down for high INR to 2.5mg  po daily just before admission.   Goal of Therapy:  Heparin level 0.3-0.5 units/ml Monitor platelets by anticoagulation protocol: Yes   Plan:  Reduce Heparin to 1100 units/hr.  Re-check 8hr  heparin level if able Daily heparin level and CBC while on therapy Follow-up plan to resume warfarin therapy/   Addendum to Plan:  Resume Warfarin 2.5mg  po x1 tonight.  Continue Heparin IV for now.  Plan to d/c Heparin when INR >2 unless MD wants to discontinue now Monitor for any bleeding  Sloan Leiter, PharmD, BCPS, BCCCP Clinical Pharmacist Please refer to [facility] for [place] numbers 01/16/2020   Addendum: Pt discharging home today per Dr. Jamse Arn.  Stopping IV Heparin.  Recommended Warfarin 2.5mg  po daily with INR check by the end of this week.

## 2020-01-16 NOTE — Discharge Summary (Addendum)
Seth Hardy QJF:[id number] DOB: [birth year] DOA: 01/11/2020  PCP: Seth Aus, MD  Admit date: 01/11/2020  Discharge date: 01/16/2020  Admitted From: Home   disposition: Home   Recommendations for Outpatient Follow-up:   Follow up with PCP in 1-2 weeks PCP Please obtain BMP/CBC,   Home Health: N/A Equipment/Devices: N/A Consultations: Gastroenterology, cardiology Discharge Condition: Improved CODE STATUS: Full Diet Recommendation: Heart Healthy carb modified  Diet Order            Diet - low sodium heart healthy           Diet Carb Modified                  Chief Complaint  Patient presents with  . Anemia     Brief history of present illness from the day of admission and additional interim summary    Seth Stclair Robertsonis a 74 y.o.malewith medical history significant ofCAD s/p CABG in 2011-PCI in 2013, and in April 2021, PAF on Coumadin, moderate aortic stenosis, chronic anemia secondary to chronic GI bleeding who was managed with IV iron in the outpatient setting presented to the hospital for severe anemia and ongoing melena. Per patient, he has had longstanding issues with anemia-and has been followed by hematology and been given IV iron. It is thought that he has had a slow/chronic GI bleed. Plans were for bone marrow biopsy at some point. He has had a negative EGD/colonoscopy in 2017.  Has been having melena and dark appearing stool for few years now but on a daily basis for the last few weeks.  He did have some intermittent chest pain as well.  He had gone to see GI physician in the office when he was noted to have hemoglobin of 6.5 with dark stools which was heme positive.  He was then referred to the hospital for further evaluation and treatment.  In the ED, patient received 2 units of packed  RBC and the patient was admitted to the hospital.                                                                   Hospital Course    Patient was seen by GI and underwent EGD which showed gastric polyps as well as moderate gave (gastric antral vascular ectasia) with ongoing bleeding.  Patient was noted to be on warfarin and Plavix.  Plavix was discontinued under the supervision of cardiology.  Patient was placed on heparin drip.  Patient underwent APC, argon plasma fulguration patient.  Patient had cessation of his GI bleeding/melena and noted that he felt better.  Patient remained hemodynamically stable.  Patient underwent a second ablation on 01/15/2020 which he also tolerated well.  H&H noted to improve relatively quickly after transfusion and after the APC.  Patient remained hemodynamically stable.  His heart rate and rhythm was well controlled on amiodarone.  Patient had no chest pain with discontinuation of his Plavix.  Patient was discharged home with markedly improved hemoglobin.  He was discharged on aspirin and to resume his warfarin.  Patient will follow up with cardiology, Coumadin clinic and GI clinic.  Acute blood loss anemia secondary to GAVE  Patient tolerated his second ablation procedure well today. Hemoglobin is up to 11 from 9.5 yesterday. He has received 2 units PRBC on 8/20  Leukopenia WBC 2.8, unclear etiology. Repeat in the morning.  PAF On amiodarone for rate control On heparin as warfarin is being held Plan is to continue warfarin per GI upon discharge  CAD Status post DES April 2021 No chest pain or shortness of breath Continue aspirin Plavix being held and plan per cardiology is to continue to hold Plavix moving forward.  HTN Well-controlled on amlodipine and ramipril  DM2 Well controlled on present regimen  OSA Continue CPAP   Discharge diagnosis     Principal Problem:   UGI bleed Active Problems:   CAD, NATIVE VESSEL   Aortic valve  stenosis, mild   Long term (current) use of anticoagulants   Atrial fibrillation (HCC)   Hyperlipidemia   Diabetes mellitus type II, controlled (HCC)   Iron deficiency anemia due to chronic blood loss   Gastric polyps   Benign essential hypertension   Heme positive stool   Melena   GAVE (gastric antral vascular ectasia)    Discharge instructions    Discharge Instructions    Diet - low sodium heart healthy   Complete by: As directed    Diet Carb Modified   Complete by: As directed    Discharge instructions   Complete by: As directed    1. Stop taking Plavix 2. Take a baby aspirin (39m) every day. 3. Start back on your Coumadin 2.5 mg every day.  4. Make sure to keep your appointment with Coumadin clinic and Cardiology clinic as scheduled by them.  5. Follow up with GI clinic as instructed by them.  6. See your PCP in 1-2 weeks to make sure you are still doing well.   Increase activity slowly   Complete by: As directed       Discharge Medications   Allergies as of 01/16/2020   No Known Allergies     Medication List    STOP taking these medications   clopidogrel 75 MG tablet Commonly known as: PLAVIX     TAKE these medications   ALPRAZolam 0.25 MG tablet Commonly known as: XANAX Take 0.25 mg by mouth daily as needed for anxiety.   amiodarone 200 MG tablet Commonly known as: PACERONE Take 1 tablet (200 mg total) by mouth 2 (two) times daily for 6 days. Then take one tab po daily. What changed:   when to take this  additional instructions   amLODipine 5 MG tablet Commonly known as: NORVASC Take one tablet by mouth one time daily   aspirin 81 MG EC tablet Take 1 tablet (81 mg total) by mouth daily. Swallow whole. Start taking on: January 17, 2020   B-12 1000 MCG Caps Take 1,000 mcg by mouth every evening.   BLINK TEARS OP Place 1 drop into both eyes daily as needed (dry eyes).   Fish Oil 1000 MG Caps Take 1,000 mg by mouth every evening.     furosemide 20 MG tablet Commonly known as: LASIX Take 20 mg by  mouth daily as needed for edema.   glimepiride 4 MG tablet Commonly known as: AMARYL Take 4 mg by mouth daily.   insulin glargine 100 UNIT/ML injection Commonly known as: LANTUS Inject 15 Units into the skin daily.   IRON-C PO Take 1 tablet by mouth daily.   isosorbide mononitrate 30 MG 24 hr tablet Commonly known as: IMDUR Take 1 tablet (30 mg total) by mouth daily.   NovoLOG FlexPen 100 UNIT/ML FlexPen Generic drug: insulin aspart Inject 6-36 Units into the skin 2 (two) times daily with a meal.   OXYGEN Inhale 2.5 L/min into the lungs at bedtime. With CPAP   pantoprazole 40 MG tablet Commonly known as: PROTONIX Take 1 tablet (40 mg total) by mouth daily.   ramipril 10 MG capsule Commonly known as: ALTACE Take 10 mg by mouth daily.   rosuvastatin 40 MG tablet Commonly known as: CRESTOR TAKE ONE TABLET BY MOUTH EVERY DAY   sucralfate 1 g tablet Commonly known as: CARAFATE Take 1 g by mouth 2 (two) times daily.   tadalafil 10 MG tablet Commonly known as: CIALIS Take 10 mg by mouth as needed.   vitamin C 500 MG tablet Commonly known as: ASCORBIC ACID Take 500 mg by mouth every evening.   warfarin 5 MG tablet Commonly known as: COUMADIN Take as directed. If you are unsure how to take this medication, talk to your nurse or doctor. Original instructions: Take 2.5 mg by mouth daily.        Follow-up Information    Rise Mu, PA-C Follow up.   Specialties: Physician Assistant, Cardiology, Radiology Why: Hospital follow-up scheduled for 02/09/2020 at 2:30pm with Christell Faith, one of Dr. Tyrell Antonio PAs. Please arrive 15 minutes early for check-in. If this date/time does not work for you, please call our office to reschedule. Contact information: [address] [place] [address] [phone]        [place] [facility] Follow up.   Specialty: Cardiology Why: You have  an appointment with the Coumadin Clinic in our [facility] on 01/19/2020 at 1:30pm. Contact information: [address] [place]       Alfredia Ferguson, PA-C Follow up on 02/21/2020.   Specialty: Gastroenterology Why: 2 PM for GI follow up  Contact information: [place] [address] [phone]               Major procedures and Radiology Reports - PLEASE review detailed and final reports thoroughly  -        DG Chest Portable 1 View  Result Date: 12/25/2019 CLINICAL DATA:  Chest pain EXAM: PORTABLE CHEST 1 VIEW COMPARISON:  CT 08/20/2015, radiograph 07/25/2015 FINDINGS: Postsurgical changes related to prior CABG including intact and aligned sternotomy wires and multiple surgical clips projecting over the mediastinum. Mild cardiomegaly is similar to prior accounting for the portable technique. The aorta is calcified. The remaining cardiomediastinal contours are unremarkable. Central pulmonary vascular congestion is noted. There are few peripheral septal lines as well as some hazy bibasilar opacity. No pneumothorax or visible effusion. No focal consolidative opacity is seen. No acute osseous or soft tissue abnormality. Telemetry leads overlie the chest. IMPRESSION: 1. Mild cardiomegaly with central pulmonary vascular congestion with features of mild interstitial edema. Electronically Signed   By: Lovena Le M.D.   On: 12/25/2019 23:45   ECHOCARDIOGRAM COMPLETE  Result Date: 12/26/2019    ECHOCARDIOGRAM REPORT  Patient Name:   JAEGAR CROFT Date of Exam: 12/26/2019 Medical Rec #:  [id number]        Height:       68.0 in Accession #:    [id number]       Weight:       180.0 lb Date of Birth:  1946/04/08        BSA:          1.954 m Patient Age:    43 years         BP:           139/52 mmHg Patient Gender: M                HR:           54 bpm. Exam Location:  [facility] Procedure: 2D Echo, Cardiac Doppler and Color Doppler  Indications:     Elevated troponin  History:         Patient has prior history of Echocardiogram examinations, most                  recent 07/26/2019. Previous Myocardial Infarction,                  Arrythmias:Atrial Fibrillation, Signs/Symptoms:Shortness of                  Breath and Dyspnea; Risk Factors:Hypertension. Mild Aortic                  stenosis.  Sonographer:     Sherrie Sport RDCS (AE) Referring Phys:  [phone] CHRISTOPHER END Diagnosing Phys: Nelva Bush MD IMPRESSIONS  1. Left ventricular ejection fraction, by estimation, is 60 to 65%. The left ventricle has normal function. Left ventricular endocardial border not optimally defined to evaluate regional wall motion. There is moderate left ventricular hypertrophy, with severe focal basal hypertrophy of the septum. Left ventricular diastolic parameters are consistent with Grade II diastolic dysfunction (pseudonormalization). Elevated left atrial pressure.  2. Right ventricular systolic function is mildly reduced. The right ventricular size is normal. Tricuspid regurgitation signal is inadequate for assessing PA pressure.  3. Left atrial size was mildly dilated.  4. The mitral valve is grossly normal. No evidence of mitral valve regurgitation. No evidence of mitral stenosis.  5. The aortic valve is tricuspid. Aortic valve regurgitation is not visualized. Mild to moderate aortic valve stenosis, though degree of stenosis may be underestimated due to poor Doppler envelopes. FINDINGS  Left Ventricle: Left ventricular ejection fraction, by estimation, is 60 to 65%. The left ventricle has normal function. Left ventricular endocardial border not optimally defined to evaluate regional wall motion. The left ventricular internal cavity size was normal in size. There is moderate left ventricular hypertrophy, with severe focal basal hypertrophy of the septum. Left ventricular diastolic parameters are consistent with Grade II diastolic dysfunction  (pseudonormalization). Elevated left atrial pressure. Right Ventricle: The right ventricular size is normal. Right vetricular wall thickness was not assessed. Right ventricular systolic function is mildly reduced. Tricuspid regurgitation signal is inadequate for assessing PA pressure. Left Atrium: Left atrial size was mildly dilated. Right Atrium: Right atrial size was normal in size. Pericardium: The pericardium was not well visualized. Mitral Valve: The mitral valve is grossly normal. No evidence of mitral valve regurgitation. No evidence of mitral valve stenosis. Tricuspid Valve: The tricuspid valve is not well visualized. Tricuspid valve regurgitation is trivial. Aortic Valve: The aortic valve is tricuspid. . There is moderate thickening and moderate calcification  of the aortic valve. Aortic valve regurgitation is not visualized. Mild to moderate aortic stenosis is present, though degree of stenosis is likely underestimated due to poor Doppler envelopes. There is moderate thickening of the aortic valve. There is moderate calcification of the aortic valve. Aortic valve mean gradient measures 8.0 mmHg. Aortic valve peak gradient measures 16.5 mmHg. Aortic valve  area, by VTI measures 1.72 cm. Pulmonic Valve: The pulmonic valve was not well visualized. Pulmonic valve regurgitation is not visualized. No evidence of pulmonic stenosis. Aorta: The aortic root is normal in size and structure. Pulmonary Artery: The pulmonary artery is not well seen. Venous: The inferior vena cava was not well visualized. IAS/Shunts: The interatrial septum was not well visualized.  LEFT VENTRICLE PLAX 2D LVIDd:         4.10 cm  Diastology LVIDs:         2.63 cm  LV e' lateral:   7.83 cm/s LV PW:         1.40 cm  LV E/e' lateral: 13.0 LV IVS:        1.94 cm  LV e' medial:    5.55 cm/s LVOT diam:     2.00 cm  LV E/e' medial:  18.4 LV SV:         68 LV SV Index:   35 LVOT Area:     3.14 cm  RIGHT VENTRICLE TAPSE (M-mode): 1.6 cm LEFT  ATRIUM             Index       RIGHT ATRIUM           Index LA diam:        4.20 cm 2.15 cm/m  RA Area:     14.00 cm LA Vol (A2C):   26.3 ml 13.46 ml/m RA Volume:   32.70 ml  16.73 ml/m LA Vol (A4C):   59.5 ml 30.45 ml/m LA Biplane Vol: 43.2 ml 22.11 ml/m  AORTIC VALVE                    PULMONIC VALVE AV Area (Vmax):    1.20 cm     PV Vmax:        1.01 m/s AV Area (Vmean):   1.27 cm     PV Peak grad:   4.1 mmHg AV Area (VTI):     1.72 cm     RVOT Peak grad: 6 mmHg AV Vmax:           203.33 cm/s AV Vmean:          126.000 cm/s AV VTI:            0.399 m AV Peak Grad:      16.5 mmHg AV Mean Grad:      8.0 mmHg LVOT Vmax:         77.60 cm/s LVOT Vmean:        50.900 cm/s LVOT VTI:          0.218 m LVOT/AV VTI ratio: 0.55  AORTA Ao Root diam: 2.80 cm MITRAL VALVE MV Area (PHT): 2.60 cm     SHUNTS MV Decel Time: 292 msec     Systemic VTI:  0.22 m MV E velocity: 102.00 cm/s  Systemic Diam: 2.00 cm MV A velocity: 78.60 cm/s MV E/A ratio:  1.30 Nelva Bush MD Electronically signed by Nelva Bush MD Signature Date/Time: 12/26/2019/3:54:01 PM    Final     Micro Results    Recent  Results (from the past 240 hour(s))  SARS Coronavirus 2 by RT PCR (hospital order, performed in [facility] hospital lab) Nasopharyngeal Nasopharyngeal Swab     Status: None   Collection Time: 01/11/20  3:15 PM   Specimen: Nasopharyngeal Swab  Result Value Ref Range Status   SARS Coronavirus 2 NEGATIVE NEGATIVE Final    Comment: (NOTE) SARS-CoV-2 target nucleic acids are NOT DETECTED.  The SARS-CoV-2 RNA is generally detectable in upper and lower respiratory specimens during the acute phase of infection. The lowest concentration of SARS-CoV-2 viral copies this assay can detect is 250 copies / mL. A negative result does not preclude SARS-CoV-2 infection and should not be used as the sole basis for treatment or other patient management decisions.  A negative result may occur with improper specimen collection /  handling, submission of specimen other than nasopharyngeal swab, presence of viral mutation(s) within the areas targeted by this assay, and inadequate number of viral copies (<250 copies / mL). A negative result must be combined with clinical observations, patient history, and epidemiological information.  Fact Sheet for Patients:   StrictlyIdeas.no  Fact Sheet for Healthcare Providers: BankingDealers.co.za  This test is not yet approved or  cleared by the [place] FDA and has been authorized for detection and/or diagnosis of SARS-CoV-2 by FDA under an Emergency Use Authorization (EUA).  This EUA will remain in effect (meaning this test can be used) for the duration of the COVID-19 declaration under Section 564(b)(1) of the Act, 21 U.S.C. section 360bbb-3(b)(1), unless the authorization is terminated or revoked sooner.  Performed at [facility], [place] [address] [place], [place] [address]     Today   Subjective    Ladarien Beeks feels much improved since admission.  Feels ready to go home.  Denies chest pain, shortness of breath or abdominal pain.  Feels they can take care of themselves with the resources they have at home.  Objective   Blood pressure (!) 145/43, pulse (!) 45, temperature 97.9 F (36.6 C), temperature source Oral, resp. rate 16, height 5' 8"  (1.727 m), weight 81.6 kg, SpO2 95 %.   Intake/Output Summary (Last 24 hours) at 01/16/2020 1915 Last data filed at 01/16/2020 1329 Gross per 24 hour  Intake 973.73 ml  Output 2250 ml  Net -1276.27 ml    Exam General:  Well-appearing man in excellent spirits with his attentive wife at bedside both of them are eager to go home. Eyes: sclera anicteric, conjuctiva mild injection bilaterally CVS: S1-S2, regular  Respiratory:  decreased air entry bilaterally secondary to decreased inspiratory effort, rales at bases  GI: NABS, soft, NT  LE: No edema.  Neuro: A/O  x 3, Moving all extremities equally with normal strength, CN 3-12 intact, grossly nonfocal.  Psych: patient is logical and coherent, judgement and insight appear normal, mood and affect appropriate to situation.    Data Review   CBC w Diff:  Lab Results  Component Value Date   WBC 5.3 01/16/2020   HGB 11.6 (L) 01/16/2020   HGB 13.2 01/22/2013   HCT 37.1 (L) 01/16/2020   HCT 38.2 (L) 01/22/2013   PLT 222 01/16/2020   PLT 173 01/22/2013   LYMPHOPCT 13.9 01/11/2020   LYMPHOPCT 32.2 01/22/2013   MONOPCT 12.9 (H) 01/11/2020   MONOPCT 14.3 01/22/2013   EOSPCT 1.8 01/11/2020   EOSPCT 4.7 01/22/2013   BASOPCT 0.7 01/11/2020   BASOPCT 0.5 01/22/2013    CMP:  Lab Results  Component Value Date  NA 140 01/16/2020   NA 139 01/22/2013   K 4.7 01/16/2020   K 4.2 01/22/2013   CL 104 01/16/2020   CL 106 01/22/2013   CO2 25 01/16/2020   CO2 28 01/22/2013   BUN 12 01/16/2020   BUN 16 01/22/2013   CREATININE 1.32 (H) 01/16/2020   CREATININE 1.02 01/22/2013   PROT 5.3 (L) 01/11/2020   PROT 7.5 01/21/2013   ALBUMIN 3.1 (L) 01/11/2020   ALBUMIN 4.3 01/21/2013   BILITOT 0.5 01/11/2020   BILITOT 0.4 01/21/2013   ALKPHOS 58 01/11/2020   ALKPHOS 143 (H) 01/21/2013   AST 22 01/11/2020   AST 28 01/21/2013   ALT 27 01/11/2020   ALT 36 01/21/2013  .   Total Time in preparing paper work, data evaluation and todays exam - 35 minutes  Vashti Hey M.D on 01/16/2020 at 7:15 PM  [facility]   Office  [phone]

## 2020-01-16 NOTE — Progress Notes (Signed)
          Daily Rounding Note  01/16/2020, 10:27 AM  LOS: 5 days   SUBJECTIVE:   Chief complaint: acute on chronic anemia, FOBT + stools/melena    No BM's.  Feels well.  Going home today  OBJECTIVE:         Vital signs in last 24 hours:    Temp:  [99 F (37.2 C)-99.1 F (37.3 C)] 99.1 F (37.3 C) (08/24 0437) Pulse Rate:  [45-51] 51 (08/24 0437) Resp:  [18] 18 (08/24 0437) BP: (145-165)/(50-55) 145/51 (08/24 0832) SpO2:  [95 %-97 %] 95 % (08/24 0437) Weight:  [81.6 kg] 81.6 kg (08/24 0437) Last BM Date: 01/12/20 Filed Weights   01/14/20 0342 01/15/20 0327 01/16/20 0437  Weight: 82.7 kg 82.1 kg 81.6 kg   General: looks well, comfortable   Heart: RRR, systolic murmer Chest: clear bil.  No dyspnea or cough. Abdomen: soft, NT, active BS, ND  Extremities: no CCE Neuro/Psych:  Oriented fully, fluid speech.  No tremors or gross weakness.    Intake/Output from previous day: 08/23 0701 - 08/24 0700 In: 1413.7 [P.O.:1060; I.V.:353.7] Out: 1800 [Urine:1800]  Intake/Output this shift: Total I/O In: 360 [P.O.:360] Out: 450 [Urine:450]  Lab Results: Recent Labs    01/14/20 0544 01/15/20 1211 01/16/20 0746  WBC 4.1 2.8* 5.3  HGB 9.5* 11.1* 11.6*  HCT 29.8* 35.5* 37.1*  PLT 180 207 222   BMET Recent Labs    01/14/20 0544 01/16/20 0746  NA 140 140  K 4.0 4.7  CL 105 104  CO2 26 25  GLUCOSE 135* 143*  BUN 12 12  CREATININE 1.30* 1.32*  CALCIUM 8.7* 9.3   LFT No results for input(s): PROT, ALBUMIN, AST, ALT, ALKPHOS, BILITOT, BILIDIR, IBILI in the last 72 hours. PT/INR Recent Labs    01/15/20 1211 01/16/20 0746  LABPROT 14.9 14.4  INR 1.2 1.2   Hepatitis Panel No results for input(s): HEPBSAG, HCVAB, HEPAIGM, HEPBIGM in the last 72 hours.  Studies/Results: No results found.  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  2 gastric polyps, not resected due to presence of Plavix, warfarin. 01/15/2020 EGD: 2 gastric polyps resected, polypectomy sites clipped.,  No clips placed at site of polypectomy from cardia.  Mild focal bleeding of GAVE, treated with APC.  Duodenal bulb, D2 normal. Hgb 6.2 >> 11.6  *   A. fib, history of RVR.  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 Can resume Coumadin, Plavix, low-dose ASA today though    cardiologist note mentions starting only warfarin and 81 ASA, not restarting Plavix going forward.   Continue oral iron  *   fup with PA [place]  9/29  *  Has fup labs and oncology/infusion visits set for 8/30 and 9/7 w possible feraheme or PRBC.      Azucena Freed  01/16/2020, 10:27 AM Phone [phone]

## 2020-01-16 NOTE — Plan of Care (Signed)
Patient medically clear for discharge. Education complete. Problem: Education: Goal: Knowledge of General Education information will improve Description: Including pain rating scale, medication(s)/side effects and non-pharmacologic comfort measures Outcome: Adequate for Discharge   Problem: Health Behavior/Discharge Planning: Goal: Ability to manage health-related needs will improve Outcome: Adequate for Discharge   Problem: Clinical Measurements: Goal: Ability to maintain clinical measurements within normal limits will improve Outcome: Adequate for Discharge Goal: Will remain free from infection Outcome: Adequate for Discharge Goal: Diagnostic test results will improve Outcome: Adequate for Discharge Goal: Respiratory complications will improve Outcome: Adequate for Discharge Goal: Cardiovascular complication will be avoided Outcome: Adequate for Discharge   Problem: Activity: Goal: Risk for activity intolerance will decrease Outcome: Adequate for Discharge   Problem: Nutrition: Goal: Adequate nutrition will be maintained Outcome: Adequate for Discharge   Problem: Coping: Goal: Level of anxiety will decrease Outcome: Adequate for Discharge   Problem: Elimination: Goal: Will not experience complications related to bowel motility Outcome: Adequate for Discharge Goal: Will not experience complications related to urinary retention Outcome: Adequate for Discharge   Problem: Pain Managment: Goal: General experience of comfort will improve Outcome: Adequate for Discharge   Problem: Safety: Goal: Ability to remain free from injury will improve Outcome: Adequate for Discharge   Problem: Skin Integrity: Goal: Risk for impaired skin integrity will decrease Outcome: Adequate for Discharge   Problem: Education: Goal: Ability to identify signs and symptoms of gastrointestinal bleeding will improve Outcome: Adequate for Discharge   Problem: Bowel/Gastric: Goal: Will show no  signs and symptoms of gastrointestinal bleeding Outcome: Adequate for Discharge   Problem: Fluid Volume: Goal: Will show no signs and symptoms of excessive bleeding Outcome: Adequate for Discharge   Problem: Clinical Measurements: Goal: Complications related to the disease process, condition or treatment will be avoided or minimized Outcome: Adequate for Discharge

## 2020-01-17 ENCOUNTER — Other Ambulatory Visit: Payer: Self-pay | Admitting: *Deleted

## 2020-01-17 ENCOUNTER — Telehealth: Payer: Self-pay | Admitting: *Deleted

## 2020-01-17 ENCOUNTER — Encounter: Payer: Self-pay | Admitting: *Deleted

## 2020-01-17 ENCOUNTER — Encounter: Payer: Self-pay | Admitting: Gastroenterology

## 2020-01-17 DIAGNOSIS — D500 Iron deficiency anemia secondary to blood loss (chronic): Secondary | ICD-10-CM

## 2020-01-17 NOTE — Telephone Encounter (Signed)
Spoke with Dr. Rogue Bussing - md would like pt have an add on lab encounter on Monday 115 prior to patient's iron infusion. Lab encounter added to patient's schedule.

## 2020-01-17 NOTE — Telephone Encounter (Signed)
Please advise on follow-up. Patient discharged from hospital yesterday.

## 2020-01-19 ENCOUNTER — Ambulatory Visit (INDEPENDENT_AMBULATORY_CARE_PROVIDER_SITE_OTHER): Payer: PPO | Admitting: *Deleted

## 2020-01-19 ENCOUNTER — Other Ambulatory Visit: Payer: Self-pay

## 2020-01-19 DIAGNOSIS — I4891 Unspecified atrial fibrillation: Secondary | ICD-10-CM | POA: Diagnosis not present

## 2020-01-19 DIAGNOSIS — Z7901 Long term (current) use of anticoagulants: Secondary | ICD-10-CM

## 2020-01-19 DIAGNOSIS — Z5181 Encounter for therapeutic drug level monitoring: Secondary | ICD-10-CM | POA: Diagnosis not present

## 2020-01-19 LAB — POCT INR: INR: 1.1 — AB (ref 2.0–3.0)

## 2020-01-19 NOTE — Patient Instructions (Addendum)
Description   Take 1 tablet today then continue to take 1/2 a tablet daily.  - Recheck INR in on 8/31 (Lab INR already scheduled for pacemaker placement on 9/3)

## 2020-01-22 ENCOUNTER — Inpatient Hospital Stay: Payer: PPO

## 2020-01-22 ENCOUNTER — Other Ambulatory Visit: Payer: Self-pay

## 2020-01-22 VITALS — BP 146/57 | HR 41 | Temp 97.8°F | Resp 16

## 2020-01-22 DIAGNOSIS — D500 Iron deficiency anemia secondary to blood loss (chronic): Secondary | ICD-10-CM | POA: Diagnosis not present

## 2020-01-22 LAB — BASIC METABOLIC PANEL
Anion gap: 8 (ref 5–15)
BUN: 21 mg/dL (ref 8–23)
CO2: 26 mmol/L (ref 22–32)
Calcium: 8.5 mg/dL — ABNORMAL LOW (ref 8.9–10.3)
Chloride: 106 mmol/L (ref 98–111)
Creatinine, Ser: 1.49 mg/dL — ABNORMAL HIGH (ref 0.61–1.24)
GFR calc Af Amer: 53 mL/min — ABNORMAL LOW (ref >60)
GFR calc non Af Amer: 46 mL/min — ABNORMAL LOW (ref >60)
Glucose, Bld: 201 mg/dL — ABNORMAL HIGH (ref 70–99)
Potassium: 5.4 mmol/L — ABNORMAL HIGH (ref 3.5–5.1)
Sodium: 140 mmol/L (ref 135–145)

## 2020-01-22 LAB — CBC WITH DIFFERENTIAL/PLATELET
Abs Immature Granulocytes: 0.01 10*3/uL (ref 0.00–0.07)
Basophils Absolute: 0 10*3/uL (ref 0.0–0.1)
Basophils Relative: 1 %
Eosinophils Absolute: 0.2 10*3/uL (ref 0.0–0.5)
Eosinophils Relative: 5 %
HCT: 34 % — ABNORMAL LOW (ref 39.0–52.0)
Hemoglobin: 10.9 g/dL — ABNORMAL LOW (ref 13.0–17.0)
Immature Granulocytes: 0 %
Lymphocytes Relative: 20 %
Lymphs Abs: 0.6 10*3/uL — ABNORMAL LOW (ref 0.7–4.0)
MCH: 32.3 pg (ref 26.0–34.0)
MCHC: 32.1 g/dL (ref 30.0–36.0)
MCV: 100.9 fL — ABNORMAL HIGH (ref 80.0–100.0)
Monocytes Absolute: 0.4 10*3/uL (ref 0.1–1.0)
Monocytes Relative: 11 %
Neutro Abs: 2.1 10*3/uL (ref 1.7–7.7)
Neutrophils Relative %: 63 %
Platelets: 172 10*3/uL (ref 150–400)
RBC: 3.37 MIL/uL — ABNORMAL LOW (ref 4.22–5.81)
RDW: 15.9 % — ABNORMAL HIGH (ref 11.5–15.5)
WBC: 3.3 10*3/uL — ABNORMAL LOW (ref 4.0–10.5)
nRBC: 0 % (ref 0.0–0.2)

## 2020-01-22 LAB — SAMPLE TO BLOOD BANK

## 2020-01-22 MED ORDER — SODIUM CHLORIDE 0.9 % IV SOLN
Freq: Once | INTRAVENOUS | Status: AC
  Filled 2020-01-22: qty 250

## 2020-01-22 MED ORDER — SODIUM CHLORIDE 0.9 % IV SOLN
510.0000 mg | Freq: Once | INTRAVENOUS | Status: AC
  Administered 2020-01-22: 510 mg via INTRAVENOUS
  Filled 2020-01-22: qty 510

## 2020-01-23 ENCOUNTER — Other Ambulatory Visit
Admission: RE | Admit: 2020-01-23 | Discharge: 2020-01-23 | Disposition: A | Discharge status: Home / Self Care | Payer: PPO | Source: Ambulatory Visit | Attending: Cardiology | Admitting: Cardiology

## 2020-01-23 DIAGNOSIS — Z01812 Encounter for preprocedural laboratory examination: Secondary | ICD-10-CM | POA: Insufficient documentation

## 2020-01-23 DIAGNOSIS — Z20822 Contact with and (suspected) exposure to covid-19: Secondary | ICD-10-CM | POA: Diagnosis not present

## 2020-01-23 LAB — SARS CORONAVIRUS 2 (TAT 6-24 HRS): SARS Coronavirus 2: NEGATIVE

## 2020-01-24 ENCOUNTER — Telehealth: Payer: Self-pay

## 2020-01-24 ENCOUNTER — Other Ambulatory Visit
Admission: RE | Admit: 2020-01-24 | Discharge: 2020-01-24 | Disposition: A | Discharge status: Home / Self Care | Payer: PPO | Attending: Cardiology | Admitting: Cardiology

## 2020-01-24 DIAGNOSIS — D649 Anemia, unspecified: Secondary | ICD-10-CM | POA: Diagnosis not present

## 2020-01-24 DIAGNOSIS — I4819 Other persistent atrial fibrillation: Secondary | ICD-10-CM | POA: Insufficient documentation

## 2020-01-24 DIAGNOSIS — R001 Bradycardia, unspecified: Secondary | ICD-10-CM | POA: Insufficient documentation

## 2020-01-24 DIAGNOSIS — Z7901 Long term (current) use of anticoagulants: Secondary | ICD-10-CM

## 2020-01-24 DIAGNOSIS — I4891 Unspecified atrial fibrillation: Secondary | ICD-10-CM

## 2020-01-24 LAB — CBC WITH DIFFERENTIAL/PLATELET
Abs Immature Granulocytes: 0.01 10*3/uL (ref 0.00–0.07)
Basophils Absolute: 0 10*3/uL (ref 0.0–0.1)
Basophils Relative: 1 %
Eosinophils Absolute: 0.2 10*3/uL (ref 0.0–0.5)
Eosinophils Relative: 5 %
HCT: 36.2 % — ABNORMAL LOW (ref 39.0–52.0)
Hemoglobin: 11.2 g/dL — ABNORMAL LOW (ref 13.0–17.0)
Immature Granulocytes: 0 %
Lymphocytes Relative: 16 %
Lymphs Abs: 0.6 10*3/uL — ABNORMAL LOW (ref 0.7–4.0)
MCH: 32.3 pg (ref 26.0–34.0)
MCHC: 30.9 g/dL (ref 30.0–36.0)
MCV: 104.3 fL — ABNORMAL HIGH (ref 80.0–100.0)
Monocytes Absolute: 0.4 10*3/uL (ref 0.1–1.0)
Monocytes Relative: 12 %
Neutro Abs: 2.4 10*3/uL (ref 1.7–7.7)
Neutrophils Relative %: 66 %
Platelets: 179 10*3/uL (ref 150–400)
RBC: 3.47 MIL/uL — ABNORMAL LOW (ref 4.22–5.81)
RDW: 15.8 % — ABNORMAL HIGH (ref 11.5–15.5)
WBC: 3.6 10*3/uL — ABNORMAL LOW (ref 4.0–10.5)
nRBC: 0 % (ref 0.0–0.2)

## 2020-01-24 LAB — BASIC METABOLIC PANEL
Anion gap: 6 (ref 5–15)
BUN: 16 mg/dL (ref 8–23)
CO2: 27 mmol/L (ref 22–32)
Calcium: 9.2 mg/dL (ref 8.9–10.3)
Chloride: 107 mmol/L (ref 98–111)
Creatinine, Ser: 1.36 mg/dL — ABNORMAL HIGH (ref 0.61–1.24)
GFR calc Af Amer: 59 mL/min — ABNORMAL LOW (ref >60)
GFR calc non Af Amer: 51 mL/min — ABNORMAL LOW (ref >60)
Glucose, Bld: 137 mg/dL — ABNORMAL HIGH (ref 70–99)
Potassium: 5.3 mmol/L — ABNORMAL HIGH (ref 3.5–5.1)
Sodium: 140 mmol/L (ref 135–145)

## 2020-01-24 LAB — PROTIME-INR
INR: 1.8 — ABNORMAL HIGH (ref 0.8–1.2)
Prothrombin Time: 20.6 seconds — ABNORMAL HIGH (ref 11.4–15.2)

## 2020-01-24 NOTE — Telephone Encounter (Signed)
Patient's wife calling back. °

## 2020-01-24 NOTE — Telephone Encounter (Signed)
Pt sched for INR check 01/31/20 @ 4:15.

## 2020-01-24 NOTE — Telephone Encounter (Signed)
Call placed to family.  Pt did not get his lab work yesterday as instructed.  He will go for lab work today.  Will follow to determine coumadin instructions.

## 2020-01-24 NOTE — Telephone Encounter (Signed)
Call returned to Seth Hardy.  Per Dr. Henri Medal patient take warfarin tonight (2.5 mg) 01/24/2020 and then do NOT take on 01/25/2020 and hold until after procedure.    Advised Seth Hardy and wife.  All questions regarding procedures were answered.  Coumadin clinic also aware of instruction and will plan on scheduling Seth Hardy for an INR check after procedure (next week)

## 2020-01-24 NOTE — Addendum Note (Signed)
Addended by: Willeen Cass A on: 01/24/2020 08:32 AM   Modules accepted: Orders

## 2020-01-25 NOTE — Progress Notes (Signed)
Instructed patient on the following items: Arrival time 0530 Nothing to eat or drink after midnight No meds AM of procedure Responsible person to drive you home and stay with you for 24 hrs Wash with special soap night before and morning of procedure If on anti-coagulant drug instructions Coumadin- last dose will be 9/1

## 2020-01-26 ENCOUNTER — Ambulatory Visit (HOSPITAL_COMMUNITY)
Admission: RE | Admit: 2020-01-26 | Discharge: 2020-01-26 | Disposition: A | Discharge status: Home / Self Care | Payer: PPO | Attending: Cardiology | Admitting: Cardiology

## 2020-01-26 ENCOUNTER — Ambulatory Visit (HOSPITAL_COMMUNITY)
Admission: RE | Disposition: A | Discharge status: Home / Self Care | Payer: PPO | Source: Home / Self Care | Attending: Cardiology

## 2020-01-26 ENCOUNTER — Other Ambulatory Visit: Payer: Self-pay

## 2020-01-26 ENCOUNTER — Telehealth: Payer: Self-pay | Admitting: Cardiovascular Disease

## 2020-01-26 ENCOUNTER — Other Ambulatory Visit: Payer: Self-pay | Admitting: *Deleted

## 2020-01-26 ENCOUNTER — Encounter: Payer: Self-pay | Admitting: *Deleted

## 2020-01-26 ENCOUNTER — Ambulatory Visit (HOSPITAL_COMMUNITY): Payer: PPO

## 2020-01-26 DIAGNOSIS — K219 Gastro-esophageal reflux disease without esophagitis: Secondary | ICD-10-CM | POA: Diagnosis not present

## 2020-01-26 DIAGNOSIS — I480 Paroxysmal atrial fibrillation: Secondary | ICD-10-CM | POA: Insufficient documentation

## 2020-01-26 DIAGNOSIS — Z95828 Presence of other vascular implants and grafts: Secondary | ICD-10-CM | POA: Insufficient documentation

## 2020-01-26 DIAGNOSIS — Z8052 Family history of malignant neoplasm of bladder: Secondary | ICD-10-CM | POA: Insufficient documentation

## 2020-01-26 DIAGNOSIS — N189 Chronic kidney disease, unspecified: Secondary | ICD-10-CM | POA: Insufficient documentation

## 2020-01-26 DIAGNOSIS — Z7902 Long term (current) use of antithrombotics/antiplatelets: Secondary | ICD-10-CM | POA: Diagnosis not present

## 2020-01-26 DIAGNOSIS — E1122 Type 2 diabetes mellitus with diabetic chronic kidney disease: Secondary | ICD-10-CM | POA: Diagnosis not present

## 2020-01-26 DIAGNOSIS — Z800 Family history of malignant neoplasm of digestive organs: Secondary | ICD-10-CM | POA: Diagnosis not present

## 2020-01-26 DIAGNOSIS — I779 Disorder of arteries and arterioles, unspecified: Secondary | ICD-10-CM | POA: Insufficient documentation

## 2020-01-26 DIAGNOSIS — I2510 Atherosclerotic heart disease of native coronary artery without angina pectoris: Secondary | ICD-10-CM | POA: Diagnosis not present

## 2020-01-26 DIAGNOSIS — Z8249 Family history of ischemic heart disease and other diseases of the circulatory system: Secondary | ICD-10-CM | POA: Insufficient documentation

## 2020-01-26 DIAGNOSIS — Z9981 Dependence on supplemental oxygen: Secondary | ICD-10-CM | POA: Diagnosis not present

## 2020-01-26 DIAGNOSIS — Z950 Presence of cardiac pacemaker: Secondary | ICD-10-CM

## 2020-01-26 DIAGNOSIS — Z951 Presence of aortocoronary bypass graft: Secondary | ICD-10-CM | POA: Insufficient documentation

## 2020-01-26 DIAGNOSIS — Z794 Long term (current) use of insulin: Secondary | ICD-10-CM | POA: Insufficient documentation

## 2020-01-26 DIAGNOSIS — G4733 Obstructive sleep apnea (adult) (pediatric): Secondary | ICD-10-CM | POA: Insufficient documentation

## 2020-01-26 DIAGNOSIS — E785 Hyperlipidemia, unspecified: Secondary | ICD-10-CM | POA: Insufficient documentation

## 2020-01-26 DIAGNOSIS — Z809 Family history of malignant neoplasm, unspecified: Secondary | ICD-10-CM | POA: Insufficient documentation

## 2020-01-26 DIAGNOSIS — Z85828 Personal history of other malignant neoplasm of skin: Secondary | ICD-10-CM | POA: Diagnosis not present

## 2020-01-26 DIAGNOSIS — Z801 Family history of malignant neoplasm of trachea, bronchus and lung: Secondary | ICD-10-CM | POA: Insufficient documentation

## 2020-01-26 DIAGNOSIS — Z6827 Body mass index (BMI) 27.0-27.9, adult: Secondary | ICD-10-CM | POA: Diagnosis not present

## 2020-01-26 DIAGNOSIS — I495 Sick sinus syndrome: Secondary | ICD-10-CM | POA: Insufficient documentation

## 2020-01-26 DIAGNOSIS — I129 Hypertensive chronic kidney disease with stage 1 through stage 4 chronic kidney disease, or unspecified chronic kidney disease: Secondary | ICD-10-CM | POA: Diagnosis not present

## 2020-01-26 DIAGNOSIS — E669 Obesity, unspecified: Secondary | ICD-10-CM | POA: Diagnosis not present

## 2020-01-26 DIAGNOSIS — Z808 Family history of malignant neoplasm of other organs or systems: Secondary | ICD-10-CM | POA: Insufficient documentation

## 2020-01-26 DIAGNOSIS — Z79899 Other long term (current) drug therapy: Secondary | ICD-10-CM | POA: Diagnosis not present

## 2020-01-26 DIAGNOSIS — I252 Old myocardial infarction: Secondary | ICD-10-CM | POA: Insufficient documentation

## 2020-01-26 DIAGNOSIS — Z841 Family history of disorders of kidney and ureter: Secondary | ICD-10-CM | POA: Insufficient documentation

## 2020-01-26 DIAGNOSIS — Z7901 Long term (current) use of anticoagulants: Secondary | ICD-10-CM | POA: Diagnosis not present

## 2020-01-26 DIAGNOSIS — D500 Iron deficiency anemia secondary to blood loss (chronic): Secondary | ICD-10-CM

## 2020-01-26 HISTORY — PX: PACEMAKER IMPLANT: EP1218

## 2020-01-26 LAB — PROTIME-INR
INR: 1.8 — ABNORMAL HIGH (ref 0.8–1.2)
Prothrombin Time: 20.4 seconds — ABNORMAL HIGH (ref 11.4–15.2)

## 2020-01-26 LAB — GLUCOSE, CAPILLARY
Glucose-Capillary: 132 mg/dL — ABNORMAL HIGH (ref 70–99)
Glucose-Capillary: 144 mg/dL — ABNORMAL HIGH (ref 70–99)

## 2020-01-26 SURGERY — PACEMAKER IMPLANT

## 2020-01-26 MED ORDER — FENTANYL CITRATE (PF) 100 MCG/2ML IJ SOLN
INTRAMUSCULAR | Status: AC
  Filled 2020-01-26: qty 2

## 2020-01-26 MED ORDER — HEPARIN (PORCINE) IN NACL 1000-0.9 UT/500ML-% IV SOLN
INTRAVENOUS | Status: DC | PRN
  Administered 2020-01-26: 500 mL

## 2020-01-26 MED ORDER — CHLORHEXIDINE GLUCONATE 4 % EX LIQD
4.0000 "application " | Freq: Once | CUTANEOUS | Status: DC
  Filled 2020-01-26: qty 60

## 2020-01-26 MED ORDER — SODIUM CHLORIDE 0.9 % IV SOLN
INTRAVENOUS | Status: AC
  Filled 2020-01-26: qty 2

## 2020-01-26 MED ORDER — CEFAZOLIN SODIUM-DEXTROSE 2-4 GM/100ML-% IV SOLN
INTRAVENOUS | Status: AC
  Filled 2020-01-26: qty 100

## 2020-01-26 MED ORDER — MIDAZOLAM HCL 5 MG/5ML IJ SOLN
INTRAMUSCULAR | Status: DC | PRN
  Administered 2020-01-26 (×2): 1 mg via INTRAVENOUS

## 2020-01-26 MED ORDER — SODIUM CHLORIDE 0.9 % IV SOLN: INTRAVENOUS | Status: DC

## 2020-01-26 MED ORDER — MIDAZOLAM HCL 5 MG/5ML IJ SOLN
INTRAMUSCULAR | Status: AC
  Filled 2020-01-26: qty 5

## 2020-01-26 MED ORDER — ONDANSETRON HCL 4 MG/2ML IJ SOLN: 4.0000 mg | Freq: Four times a day (QID) | INTRAMUSCULAR | Status: DC | PRN

## 2020-01-26 MED ORDER — LIDOCAINE HCL (PF) 1 % IJ SOLN
INTRAMUSCULAR | Status: DC | PRN
  Administered 2020-01-26: 25 mL

## 2020-01-26 MED ORDER — CEFAZOLIN SODIUM-DEXTROSE 2-4 GM/100ML-% IV SOLN
2.0000 g | INTRAVENOUS | Status: AC
  Administered 2020-01-26: 2 g via INTRAVENOUS
  Filled 2020-01-26: qty 100

## 2020-01-26 MED ORDER — ACETAMINOPHEN 325 MG PO TABS: 325.0000 mg | ORAL_TABLET | ORAL | Status: DC | PRN

## 2020-01-26 MED ORDER — FENTANYL CITRATE (PF) 100 MCG/2ML IJ SOLN
INTRAMUSCULAR | Status: DC | PRN
Start: 2020-01-26 — End: 2020-01-26
  Administered 2020-01-26 (×2): 25 ug via INTRAVENOUS

## 2020-01-26 MED ORDER — SODIUM CHLORIDE 0.9 % IV SOLN
80.0000 mg | INTRAVENOUS | Status: AC
  Administered 2020-01-26: 80 mg
  Filled 2020-01-26: qty 2

## 2020-01-26 MED ORDER — HEPARIN (PORCINE) IN NACL 1000-0.9 UT/500ML-% IV SOLN
INTRAVENOUS | Status: AC
  Filled 2020-01-26: qty 500

## 2020-01-26 MED ORDER — LIDOCAINE HCL 1 % IJ SOLN
INTRAMUSCULAR | Status: AC
  Filled 2020-01-26: qty 60

## 2020-01-26 MED ORDER — CEFAZOLIN SODIUM-DEXTROSE 2-3 GM-%(50ML) IV SOLR
INTRAVENOUS | Status: AC | PRN
  Administered 2020-01-26: 2 g via INTRAVENOUS

## 2020-01-26 SURGICAL SUPPLY — 12 items
CABLE SURGICAL S-101-97-12 (CABLE) ×2 IMPLANT
CATH SELECT PACE 669183 (CATHETERS) ×1 IMPLANT
CUTTER LV DELIVERY CATHETER 7 (MISCELLANEOUS) ×2 IMPLANT
LEAD INGEVITY 7841 52 (Lead) ×1 IMPLANT
LEAD INGEVITY 7842 59 (Lead) ×1 IMPLANT
PACEMAKER ACCOLADE DR-EL (Pacemaker) ×1 IMPLANT
PAD PRO RADIOLUCENT 2001M-C (PAD) ×2 IMPLANT
SHEATH 7FR PRELUDE SNAP 13 (SHEATH) ×1 IMPLANT
SHEATH 8FR PRELUDE SNAP 13 (SHEATH) ×1 IMPLANT
SHEATH PROBE COVER 6X72 (BAG) ×1 IMPLANT
TRAY PACEMAKER INSERTION (PACKS) ×2 IMPLANT
WIRE HI TORQ VERSACORE-J 145CM (WIRE) ×1 IMPLANT

## 2020-01-26 NOTE — Telephone Encounter (Signed)
Called and spoke to pt. Pt stated that the Dr. told him he could resume his warfarin tonight. Pt stated he held his warfarin last night (01/25/2020). Instructed pt to take 1 tablet of warfarin (5mg ) tonight and then continue to take his normal dose 1/2 a tablet (2.5mg ) daily. Pt is going to have INR checked on 01/31/2020 in [place].

## 2020-01-26 NOTE — Interval H&P Note (Signed)
History and Physical Interval Note:  01/26/2020 7:26 AM  Seth Hardy  has presented today for surgery, with the diagnosis of bradicardia.  The various methods of treatment have been discussed with the patient and family. After consideration of risks, benefits and other options for treatment, the patient has consented to  Procedure(s): PACEMAKER IMPLANT (N/A) as a surgical intervention.  [name] history has been reviewed, patient examined, no change in status, stable for surgery.  I have reviewed the patient's chart and labs.  Questions were answered to the patient's satisfaction.     Seth Hardy T Barbar Brede

## 2020-01-26 NOTE — Telephone Encounter (Signed)
Patient just returned home from pacemaker procedure Patient would like to clarify if he should take a 1/2 pill of warfarin or go back to a whole pill Please call to discuss

## 2020-01-26 NOTE — Discharge Instructions (Signed)
After Your Pacemaker   . You have a Chiropractor  ACTIVITY . Do not lift your arm above shoulder height for 1 week after your procedure. After 7 days, you may progress as below.     Friday February 02, 2020  Saturday February 03, 2020 Sunday February 04, 2020 Monday February 05, 2020   . Do not lift, push, pull, or carry anything over 10 pounds with the affected arm until 6 weeks (Friday March 08, 2020 ) after your procedure.   . Do NOT DRIVE until you have been seen for your wound check, or as long as instructed by your healthcare provider.   . Ask your healthcare provider when you can go back to work   INCISION/Dressing . If you are on a blood thinner such as Coumadin, Xarelto, Eliquis, Plavix, or Pradaxa please confirm with your provider when this should be resumed.   . Monitor your Pacemaker site for redness, swelling, and drainage. Call the device clinic at [phone] if you experience these symptoms or fever/chills.  . If your incision is sealed with Steri-strips or staples, you may shower 10 days after your procedure or when told by your provider. Do not remove the steri-strips or let the shower hit directly on your site. You may wash around your site with soap and water.    Marland Kitchen Avoid lotions, ointments, or perfumes over your incision until it is well-healed.  . You may use a hot tub or a pool AFTER your wound check appointment if the incision is completely closed.  Marland Kitchen PAcemaker Alerts:  Some alerts are vibratory and others beep. These are NOT emergencies. Please call our office to let us know. If this occurs at night or on weekends, it can wait until the next business day. Send a remote transmission.  . If your device is capable of reading fluid status (for heart failure), you will be offered monthly monitoring to review this with you.   DEVICE MANAGEMENT . Remote monitoring is used to monitor your pacemaker from home. This monitoring is scheduled every  91 days by our office. It allows [place] to keep an eye on the functioning of your device to ensure it is working properly. You will routinely see your Electrophysiologist annually (more often if necessary).   . You should receive your ID card for your new device in 4-8 weeks. Keep this card with you at all times once received. Consider wearing a medical alert bracelet or necklace.  . Your Pacemaker may be MRI compatible. This will be discussed at your next office visit/wound check.  You should avoid contact with strong electric or magnetic fields.    Do not use amateur (ham) radio equipment or electric (arc) welding torches. MP3 player headphones with magnets should not be used. Some devices are safe to use if held at least 12 inches (30 cm) from your Pacemaker. These include power tools, lawn mowers, and speakers. If you are unsure if something is safe to use, ask your health care provider.   When using your cell phone, hold it to the ear that is on the opposite side from the Pacemaker. Do not leave your cell phone in a pocket over the Pacemaker.   You may safely use electric blankets, heating pads, computers, and microwave ovens.  Call the office right away if:  You have chest pain.  You feel more short of breath than you have felt before.  You feel more light-headed than you have felt before.  Your incision starts to open up.  This information is not intended to replace advice given to you by your health care provider. Make sure you discuss any questions you have with your health care provider.

## 2020-01-29 MED FILL — Lidocaine HCl Local Inj 1%: INTRAMUSCULAR | Qty: 20 | Status: AC

## 2020-01-29 MED FILL — Lidocaine HCl Local Inj 1%: INTRAMUSCULAR | Qty: 40 | Status: AC

## 2020-01-30 ENCOUNTER — Telehealth: Payer: Self-pay | Admitting: Internal Medicine

## 2020-01-30 ENCOUNTER — Inpatient Hospital Stay (HOSPITAL_BASED_OUTPATIENT_CLINIC_OR_DEPARTMENT_OTHER): Payer: PPO | Admitting: Internal Medicine

## 2020-01-30 ENCOUNTER — Inpatient Hospital Stay: Payer: PPO

## 2020-01-30 ENCOUNTER — Other Ambulatory Visit: Payer: Self-pay

## 2020-01-30 ENCOUNTER — Inpatient Hospital Stay: Payer: PPO | Attending: Internal Medicine

## 2020-01-30 ENCOUNTER — Encounter (HOSPITAL_COMMUNITY): Payer: Self-pay | Admitting: Cardiology

## 2020-01-30 VITALS — BP 146/75 | HR 60

## 2020-01-30 DIAGNOSIS — D500 Iron deficiency anemia secondary to blood loss (chronic): Secondary | ICD-10-CM | POA: Diagnosis not present

## 2020-01-30 DIAGNOSIS — D509 Iron deficiency anemia, unspecified: Secondary | ICD-10-CM | POA: Diagnosis not present

## 2020-01-30 LAB — BASIC METABOLIC PANEL
Anion gap: 9 (ref 5–15)
BUN: 24 mg/dL — ABNORMAL HIGH (ref 8–23)
CO2: 25 mmol/L (ref 22–32)
Calcium: 8.7 mg/dL — ABNORMAL LOW (ref 8.9–10.3)
Chloride: 103 mmol/L (ref 98–111)
Creatinine, Ser: 1.29 mg/dL — ABNORMAL HIGH (ref 0.61–1.24)
GFR calc Af Amer: >60 mL/min (ref >60)
GFR calc non Af Amer: 55 mL/min — ABNORMAL LOW (ref >60)
Glucose, Bld: 196 mg/dL — ABNORMAL HIGH (ref 70–99)
Potassium: 4.8 mmol/L (ref 3.5–5.1)
Sodium: 137 mmol/L (ref 135–145)

## 2020-01-30 LAB — CBC WITH DIFFERENTIAL/PLATELET
Abs Immature Granulocytes: 0.01 10*3/uL (ref 0.00–0.07)
Basophils Absolute: 0 10*3/uL (ref 0.0–0.1)
Basophils Relative: 1 %
Eosinophils Absolute: 0.2 10*3/uL (ref 0.0–0.5)
Eosinophils Relative: 5 %
HCT: 37.2 % — ABNORMAL LOW (ref 39.0–52.0)
Hemoglobin: 12.1 g/dL — ABNORMAL LOW (ref 13.0–17.0)
Immature Granulocytes: 0 %
Lymphocytes Relative: 20 %
Lymphs Abs: 0.8 10*3/uL (ref 0.7–4.0)
MCH: 32 pg (ref 26.0–34.0)
MCHC: 32.5 g/dL (ref 30.0–36.0)
MCV: 98.4 fL (ref 80.0–100.0)
Monocytes Absolute: 0.3 10*3/uL (ref 0.1–1.0)
Monocytes Relative: 9 %
Neutro Abs: 2.4 10*3/uL (ref 1.7–7.7)
Neutrophils Relative %: 65 %
Platelets: 192 10*3/uL (ref 150–400)
RBC: 3.78 MIL/uL — ABNORMAL LOW (ref 4.22–5.81)
RDW: 14.9 % (ref 11.5–15.5)
WBC: 3.7 10*3/uL — ABNORMAL LOW (ref 4.0–10.5)
nRBC: 0 % (ref 0.0–0.2)

## 2020-01-30 LAB — SAMPLE TO BLOOD BANK

## 2020-01-30 MED ORDER — SODIUM CHLORIDE 0.9 % IV SOLN
510.0000 mg | Freq: Once | INTRAVENOUS | Status: AC
  Administered 2020-01-30: 510 mg via INTRAVENOUS
  Filled 2020-01-30: qty 510

## 2020-01-30 MED ORDER — SODIUM CHLORIDE 0.9 % IV SOLN
Freq: Once | INTRAVENOUS | Status: AC
  Filled 2020-01-30: qty 250

## 2020-01-30 NOTE — Telephone Encounter (Signed)
I spoke with the patient. I advised him that we typically do not stop imdur when a PPM is implanted as the imdur is used more for circulation in the heart.  I have advised the patient to continue imdur at this time.  He questioned if he should continue on protonix BID. I advised he will need to follow up with his PCP about this. The patient voices understanding of the above recommendations and is agreeable.

## 2020-01-30 NOTE — Telephone Encounter (Signed)
Dr. Quentin Ore patient. To Device Clinic as questions pertain to his device.

## 2020-01-30 NOTE — Progress Notes (Signed)
[facility] OFFICE PROGRESS NOTE  Patient Care Team: Rusty Aus, MD as PCP - General (Internal Medicine)   SUMMARY OF ONCOLOGIC HISTORY:  # MARCH 2017- April 2017- IRON DEFICIENCY ANEMIA ? Etiology s/p IV iron [EGD- ?June 2017- Bleeding gastric "polyp"/ colo- Dr.Gessner]; recommend CAPSULE STUDY [on HOLD sec to ?plavix]; September 2021 hemoglobin 6.5 [inpatient-EGD gastric bleeding ulcer s/p APC]; s/p 2 units PRBC;   # hx of A.fib- on coumadin-HOLD April 2017/ CAD [Dr.[name]]; Hx of gastric ulcer [Dr.Gessner];   TIA/ CEA [s/p April 2017]; OSA on CPAP; September 2021 status post permanent pacemaker  History of present illness:   [name] patient is here for follow-up of his severe iron deficiency anemia-;also on Coumadin for A. fib is here for follow-up.  In the interim patient was admitted to hospital for severe anemia hemoglobin 6.5. Patient underwent EGD; had a gastric ulcer cauterized. Patient interim also had a pacemaker placed. Patient is taken off Plavix. Is currently continues to plan Coumadin.  Denies any blood in stools black or stools. No nausea no vomiting.  Review of Systems  Constitutional: Positive for malaise/fatigue. Negative for chills, diaphoresis and fever.  HENT: Negative for nosebleeds and sore throat.   Eyes: Negative for double vision.  Respiratory: Negative for cough, hemoptysis, sputum production and wheezing.   Cardiovascular: Negative for chest pain, palpitations, orthopnea and leg swelling.  Gastrointestinal: Positive for diarrhea. Negative for abdominal pain, blood in stool, constipation, heartburn, melena, nausea and vomiting.  Genitourinary: Negative for dysuria, frequency and urgency.  Musculoskeletal: Positive for back pain and joint pain.  Skin: Negative.  Negative for itching and rash.  Neurological: Negative for dizziness, tingling, focal weakness, weakness and headaches.  Endo/Heme/Allergies: Does not bruise/bleed  easily.  Psychiatric/Behavioral: Negative for depression. The patient is not nervous/anxious and does not have insomnia.     Past Medical History:  Diagnosis Date  . Basal cell carcinoma of skin 2012   removed several spots from arms and back  . CAD (coronary artery disease)    a. 01/2010 CABG x 4: LIMA->LAD, VG->D1, VG->OM1, VG->PDA. b. NSTEMI 02/2012 in setting of AF-RVR with cath s/p BMS to RCA (plan for 1 month, possibly up to 3 months of Plavix)  . Carotid disease, bilateral ([place])    a. 03/2011 U/S: 40-59% bilat Carotid dzs;  b. 04/2015 Carotid U/S: 40-59% bilat ICA stenosis; c. 05/2015 CTA Neck: 57% RICA, 84% LICA, mod-marked R vertebral stenosis, mod L vertebral stenosis.  . Chronic kidney disease    small obtusion per pcp. ultrasound done on 08/29/2015  . Diabetes mellitus type II, controlled ([place])    a. Variable CBG 02/2012 - several meds adjusted.  Marland Kitchen Dysrhythmia    intermittent Atrial Fibrillation  . GERD (gastroesophageal reflux disease)   . Heart murmur   . Hyperlipidemia   . Hypertension   . Hypertensive heart disease   . Iron deficiency anemia    "gets infusions q once in awhile" (04/27/2018)  . Iron deficiency anemia 08/2015   received 2 units rbc one week ago, 3 IV iron infusion -last 1 week.  . Macular edema bil   lazer work done previously  . Mild aortic stenosis    a. 03/2011 Echo: EF 55-60%, Mild AS. b. Mild by cath 02/2012; c. 01/2014 Echo: EF 65-70%, Gr 1 DD, mild AS, mildly dil LA.  . Multiple gastric ulcers 2006  . Myocardial infarction ([place]) 02/2012   stents (x1) at time  . Myocardial infarction (  [place])    "2nd one was after 02/2012; don't know date" (04/27/2018)  . Obesity   . On home oxygen therapy    "2.5 w/CPAP" (04/27/2018)  . OSA on CPAP    CPAP settings 3 with oxygen 2.5 (04/27/2018)  . PAF (paroxysmal atrial fibrillation) ([place])    a. Newly dx 02/2012 & initiated on Coumadin (spont converted to NSR).  . Shortness of breath dyspnea   . Transfusion  history    transfusions -1 month ago -2 units    Past Surgical History:  Procedure Laterality Date  . ANGIOPLASTY    . BASAL CELL CARCINOMA EXCISION     "shoulder, arm X 2"  . CARDIAC CATHETERIZATION  2014  . CARDIAC CATHETERIZATION  2021   [facility] x1 stent  . CATARACT EXTRACTION W/ INTRAOCULAR LENS  IMPLANT, BILATERAL Bilateral 2016  . COLONOSCOPY WITH PROPOFOL N/A 10/15/2015   Procedure: COLONOSCOPY WITH PROPOFOL;  Surgeon: Gatha Mayer, MD;  Location: [facility] ENDOSCOPY;  Service: Endoscopy;  Laterality: N/A;  . CORONARY ANGIOPLASTY     Dr. Fletcher Anon  . CORONARY ARTERY BYPASS GRAFT  2011   CABG X3-4; [place], [place],   . CORONARY ATHERECTOMY  04/27/2018  . CORONARY ATHERECTOMY N/A 04/27/2018   Procedure: CORONARY ATHERECTOMY;  Surgeon: Belva Crome, MD;  Location: [facility] LAB;  Service: Cardiovascular;  Laterality: N/A;  . cyst L kidney  [place]  . ENDARTERECTOMY Left 08/30/2015   Procedure: ENDARTERECTOMY CAROTID;  Surgeon: Katha Cabal, MD;  Location: [facility] ORS;  Service: Vascular;  Laterality: Left;  . ESOPHAGOGASTRODUODENOSCOPY (EGD) WITH PROPOFOL N/A 10/15/2015   Procedure: ESOPHAGOGASTRODUODENOSCOPY (EGD) WITH PROPOFOL;  Surgeon: Gatha Mayer, MD;  Location: [facility] ENDOSCOPY;  Service: Endoscopy;  Laterality: N/A;  . ESOPHAGOGASTRODUODENOSCOPY (EGD) WITH PROPOFOL N/A 01/12/2020   Procedure: ESOPHAGOGASTRODUODENOSCOPY (EGD) WITH PROPOFOL;  Surgeon: Jerene Bears, MD;  Location[facility] END[facility]COPY;  Service: Gastroenterology;  Laterality: N/A;  . ESOPHAGOGASTRODUODENOSCOPY (EGD) WITH PROPOFOL N/A 01/15/2020   Procedure: ESOPHAGOGASTRODUODENOSCOPY (EGD) WITH PROPOFOL;  Surgeon: Ladene Artist, MD;  Location: [facility] ENDOSCOPY;  Service: Endoscopy;  Laterality: N/A;  . EYE SURGERY Bilateral    "has macular edema cauterized" (04/27/2018)  . HEMOSTASIS CLIP PLACEMENT  01/15/2020   Procedure: HEMOSTASIS CLIP PLACEMENT;  Surgeon: Ladene Artist, MD;   Location: [place] [place];  Service: Endoscopy;;  . HOT HEMOSTASIS N/A 01/12/2020   Procedure: HOT HEMOSTASIS (ARGON PLASMA COAGULATION/BICAP);  Surgeon: Jerene Bears, MD;  Location: [facility] ENDOSCOPY;  Service: Gastroenterology;  Laterality: N/A;  . HOT HEMOSTASIS N/A 01/15/2020   Procedure: HOT HEMOSTASIS (ARGON PLASMA COAGULATION/BICAP);  Surgeon: Ladene Artist, MD;  Location: [facility] ENDOSCOPY;  Service: Endoscopy;  Laterality: N/A;  . INGUINAL HERNIA REPAIR Right   . LEFT HEART CATHETERIZATION WITH CORONARY ANGIOGRAM N/A 03/09/2012   Procedure: LEFT HEART CATHETERIZATION WITH CORONARY ANGIOGRAM;  Surgeon: Wellington Hampshire, MD;  Location: [facility] CATH LAB;  Service: Cardiovascular;  Laterality: N/A;  . PACEMAKER IMPLANT N/A 01/26/2020   Procedure: PACEMAKER IMPLANT;  Surgeon: Vickie Epley, MD;  Location: [facility] CV LAB;  Service: Cardiovascular;  Laterality: N/A;  . PERCUTANEOUS CORONARY STENT INTERVENTION (PCI-S)  03/09/2012   Procedure: PERCUTANEOUS CORONARY STENT INTERVENTION (PCI-S);  Surgeon: Wellington Hampshire, MD;  Location: [facility] CATH LAB;  Service: Cardiovascular;;  . POLYPECTOMY  01/15/2020   Procedure: POLYPECTOMY;  Surgeon: Ladene Artist, MD;  Location: [facility] ENDOSCOPY;  Service: Endoscopy;;  . RIGHT/LEFT HEART CATH AND  CORONARY ANGIOGRAPHY N/A 04/25/2018   Procedure: RIGHT/LEFT HEART CATH AND CORONARY ANGIOGRAPHY;  Surgeon: Wellington Hampshire, MD;  Location: [facility] CV LAB;  Service: Cardiovascular;  Laterality: N/A;    Family History  Problem Relation Age of Onset  . COPD Mother        alive 71  . Bladder Cancer Mother   . Heart attack Father        07-19-2013 deceased  . Lung cancer Maternal Aunt   . Liver cancer Maternal Aunt   . Kidney disease Paternal Uncle   . Cancer Other        all paternal aunts and uncles  . Prostate cancer Neg Hx   . Kidney cancer Neg Hx     SOCIAL HISTORY:   Social History  Substance Use Topics  . Smoking status: Never Smoker   . Smokeless tobacco:  Never Used     Comment: tobacco use - no  . Alcohol Use: Yes     Comment: rare drink    ALLERGIES:  has No Known Allergies.  MEDICATIONS:  Current Outpatient Prescriptions  Medication Sig Dispense Refill  . acetaminophen (TYLENOL) 325 MG tablet Take 650 mg by mouth every 6 (six) hours as needed. For pain    . amiodarone (PACERONE) 200 MG tablet Take 0.5 tablets (100 mg total) by mouth daily. (Patient taking differently: Take 100 mg by mouth at bedtime. ) 45 tablet 3  . amLODipine (NORVASC) 5 MG tablet Take one tablet by mouth one time daily 90 tablet 3  . aspirin 81 MG EC tablet Take 81 mg by mouth daily.      Marland Kitchen atorvastatin (LIPITOR) 20 MG tablet Take 1 tablet (20 mg total) by mouth at bedtime. 90 tablet 3  . furosemide (LASIX) 20 MG tablet Take 1 tablet (20 mg total) by mouth as needed. 30 tablet 6  . glimepiride (AMARYL) 4 MG tablet Take 4 mg by mouth 2 (two) times daily.      . insulin glargine (LANTUS) 100 UNIT/ML injection Inject 20 Units into the skin daily.     . insulin lispro (HUMALOG) 100 UNIT/ML injection Inject 8 Units into the skin 2 (two) times daily with a meal. (Patient taking differently: Inject into the skin 3 (three) times daily. Per sliding scale.)    . metFORMIN (GLUCOPHAGE) 500 MG tablet Take 500 mg by mouth 2 (two) times daily with a meal.    . nitroGLYCERIN (NITROSTAT) 0.4 MG SL tablet Place 1 tablet (0.4 mg total) under the tongue every 5 (five) minutes as needed (up to 3 doses). 25 tablet 1  . OXYGEN Inhale 2.5 L/min into the lungs at bedtime. With CPAP    . pantoprazole (PROTONIX) 40 MG tablet Take 1 tablet (40 mg total) by mouth daily. (Patient taking differently: Take 40 mg by mouth 2 (two) times daily. ) 90 tablet 3  . ramipril (ALTACE) 10 MG capsule Take 1 capsule (10 mg total) by mouth daily. 90 capsule 5  . warfarin (COUMADIN) 5 MG tablet Take as directed by anticoagulation clinic 90 tablet 1  . IRON-VITAMIN C PO Take 1 tablet by mouth daily. Reported on  08/27/2015    . Omega-3 Fatty Acids (FISH OIL) 1200 MG CAPS Take 1 capsule by mouth daily. Reported on 08/27/2015     No current facility-administered medications for this visit.    PHYSICAL EXAMINATION:   BP 158/71 mmHg  Pulse 51  Temp(Src) 96.1 F (35.6 C)  Resp 18  Wt  192 lb 0.3 oz (87.1 kg)  Filed Weights   08/27/15 1015  Weight: 192 lb 0.3 oz (87.1 kg)    Physical Exam Constitutional:      Comments: Patient alone.  HENT:     Head: Normocephalic and atraumatic.     Mouth/Throat:     Pharynx: No oropharyngeal exudate.  Eyes:     Pupils: Pupils are equal, round, and reactive to light.  Cardiovascular:     Rate and Rhythm: Normal rate and regular rhythm.  Pulmonary:     Effort: No respiratory distress.     Breath sounds: No wheezing.  Abdominal:     General: Bowel sounds are normal. There is no distension.     Palpations: Abdomen is soft. There is no mass.     Tenderness: There is no abdominal tenderness. There is no guarding or rebound.  Musculoskeletal:        General: No tenderness. Normal range of motion.     Cervical back: Normal range of motion and neck supple.  Skin:    General: Skin is warm.  Neurological:     Mental Status: He is alert and oriented to person, place, and time.  Psychiatric:        Mood and Affect: Affect normal.      LABORATORY DATA:  I have reviewed the data as listed    Component Value Date/Time   NA 139 07/25/2015 1138   NA 139 01/22/2013 0348   K 4.1 07/25/2015 1138   K 4.2 01/22/2013 0348   CL 103 07/25/2015 1138   CL 106 01/22/2013 0348   CO2 27 07/25/2015 1138   CO2 28 01/22/2013 0348   GLUCOSE 216* 07/25/2015 1138   GLUCOSE 158* 01/22/2013 0348   BUN 14 07/25/2015 1138   BUN 16 01/22/2013 0348   CREATININE 1.03 07/25/2015 1138   CREATININE 1.02 01/22/2013 0348   CALCIUM 9.1 07/25/2015 1138   CALCIUM 8.6 01/22/2013 0348   PROT 7.2 06/25/2015 1045   PROT 7.5 01/21/2013 0209   ALBUMIN 4.1 06/25/2015 1045   ALBUMIN  4.3 01/21/2013 0209   AST 30 06/25/2015 1045   AST 28 01/21/2013 0209   ALT 33 06/25/2015 1045   ALT 36 01/21/2013 0209   ALKPHOS 104 06/25/2015 1045   ALKPHOS 143* 01/21/2013 0209   BILITOT 0.4 06/25/2015 1045   BILITOT 0.4 01/21/2013 0209   GFRNONAA >60 07/25/2015 1138   GFRNONAA >60 01/22/2013 0348   GFRAA >60 07/25/2015 1138   GFRAA >60 01/22/2013 0348    No results found for: SPEP, UPEP  Lab Results  Component Value Date   WBC 4.5 08/22/2015   NEUTROABS 2.5 08/22/2015   HGB 8.0 Repeated and verified X2.* 08/22/2015   HCT 26.2 Repeated and verified X2.* 08/22/2015   MCV 67.4 Repeated and verified X2.* 08/22/2015   PLT 264.0 08/22/2015      Chemistry      Component Value Date/Time   NA 139 07/25/2015 1138   NA 139 01/22/2013 0348   K 4.1 07/25/2015 1138   K 4.2 01/22/2013 0348   CL 103 07/25/2015 1138   CL 106 01/22/2013 0348   CO2 27 07/25/2015 1138   CO2 28 01/22/2013 0348   BUN 14 07/25/2015 1138   BUN 16 01/22/2013 0348   CREATININE 1.03 07/25/2015 1138   CREATININE 1.02 01/22/2013 0348      Component Value Date/Time   CALCIUM 9.1 07/25/2015 1138   CALCIUM 8.6 01/22/2013 0348   ALKPHOS 104  06/25/2015 1045   ALKPHOS 143* 01/21/2013 0209   AST 30 06/25/2015 1045   AST 28 01/21/2013 0209   ALT 33 06/25/2015 1045   ALT 36 01/21/2013 0209   BILITOT 0.4 06/25/2015 1045   BILITOT 0.4 01/21/2013 0209        ASSESSMENT & PLAN:   Iron deficiency anemia due to chronic blood loss #Macrocytic anemia-likely multifactorial anemia [iron deficient-likely secondary to gastric losses [gasric ulcer s/p APC] versus others-question cirrhosis. Less likely Bone marrow dysfunction [see below]. Given the obvious source of GI bleeding noted okay to hold a bone marrow biopsy at this time.  # Today hemoglobin 12.2; . Proceed with IV Feraheme infusion today.   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 Monitor for now.  #CAD  [aspirin 69m/day; A. Fib [on coumadin]  # DISPOSITION:  # IV ferrahem today; # monthly cbc x3 # follow up in 3 months- MD; labs-cbc/bmp; iron studies/ferritin-hold tube; possible ferrahem -Dr.B

## 2020-01-30 NOTE — Telephone Encounter (Signed)
Returning patients phone call. States he has felt like a "vibrating" at [facility] site. Patient states it occurs every now and then. Patient denies any pain, swelling, fevers or chills. Patient does report of left arm pain down into elbow. Patient states he has been taking Tylenol with relief. Advised to continue taking tylenol. Advised patient his body could be adjusting to new device and take time to heal. Advised patient if he notes pain or is more concerning to please call us back. Patient reminded of device clinic apt 02/06/20. Patient verbalizes understanding and agreeable to plan.

## 2020-01-30 NOTE — Assessment & Plan Note (Addendum)
#  Macrocytic anemia-likely multifactorial anemia [iron deficient-likely secondary to gastric losses [gasric ulcer s/p APC] versus others-question cirrhosis. Less likely Bone marrow dysfunction [see below]. Given the obvious source of GI bleeding noted okay to hold a bone marrow biopsy at this time.  # Today hemoglobin 12.2; . Proceed with IV Feraheme infusion today.   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 Monitor for now.  #CAD [aspirin $RemoveBefor'81mg'[id number]$ /day; A. Fib [on coumadin]  # DISPOSITION:  # IV ferrahem today; # monthly cbc x3 # follow up in 3 months- MD; labs-cbc/bmp; iron studies/ferritin-hold tube; possible ferrahem -Dr.B

## 2020-01-30 NOTE — Progress Notes (Signed)
Pt did call office today to let Dr Quentin Ore know that he is feeling an occasional vibration at pacer site . Waiting for office to call back.

## 2020-01-30 NOTE — Telephone Encounter (Signed)
Patient calling  Wants to discuss a few questions/concerns in regards to pacemaker Please call to discuss

## 2020-01-30 NOTE — Telephone Encounter (Signed)
Please call to discuss if patient should continue to take Isosorbide with his pacemaker.

## 2020-01-31 ENCOUNTER — Ambulatory Visit (INDEPENDENT_AMBULATORY_CARE_PROVIDER_SITE_OTHER): Payer: PPO

## 2020-01-31 ENCOUNTER — Other Ambulatory Visit: Payer: Self-pay

## 2020-01-31 DIAGNOSIS — Z7901 Long term (current) use of anticoagulants: Secondary | ICD-10-CM | POA: Diagnosis not present

## 2020-01-31 DIAGNOSIS — I4891 Unspecified atrial fibrillation: Secondary | ICD-10-CM

## 2020-01-31 LAB — POCT INR: INR: 2.2 (ref 2.0–3.0)

## 2020-01-31 NOTE — Patient Instructions (Signed)
-   continue warfarin dosage of 1/2 a tablet daily.  - Recheck INR in 6 weeks

## 2020-02-05 ENCOUNTER — Telehealth: Payer: Self-pay | Admitting: Cardiovascular Disease

## 2020-02-05 NOTE — Telephone Encounter (Signed)
Patient verbalized understanding of the recommendations.

## 2020-02-05 NOTE — Telephone Encounter (Signed)
Please call to discuss if it is ok to take Aloe Vera juice. Patient would like to know if it will interfere with any medications he is currently taking.

## 2020-02-05 NOTE — Telephone Encounter (Signed)
Would advise against the aloe vera juice.  It can increase risk of hypoglycemia (he is diabetic on insulin and glimiperide) as well as increase bleeding risk (he is on anticoagulation).

## 2020-02-06 ENCOUNTER — Other Ambulatory Visit: Payer: Self-pay

## 2020-02-06 ENCOUNTER — Ambulatory Visit (INDEPENDENT_AMBULATORY_CARE_PROVIDER_SITE_OTHER): Payer: PPO | Admitting: Emergency Medicine

## 2020-02-06 DIAGNOSIS — Z950 Presence of cardiac pacemaker: Secondary | ICD-10-CM | POA: Diagnosis not present

## 2020-02-06 DIAGNOSIS — I4819 Other persistent atrial fibrillation: Secondary | ICD-10-CM

## 2020-02-06 DIAGNOSIS — R001 Bradycardia, unspecified: Secondary | ICD-10-CM

## 2020-02-06 LAB — CUP PACEART INCLINIC DEVICE CHECK
Date Time Interrogation Session: 20210914100036
Implantable Lead Implant Date: 20210903
Implantable Lead Implant Date: 20210903
Implantable Lead Location: 753859
Implantable Lead Location: 753860
Implantable Lead Model: 7841
Implantable Lead Model: 7842
Implantable Lead Serial Number: 1053222
Implantable Lead Serial Number: 1093158
Implantable Pulse Generator Implant Date: 20210903
Lead Channel Impedance Value: 547 Ohm
Lead Channel Impedance Value: 680 Ohm
Lead Channel Pacing Threshold Amplitude: 0.7 V
Lead Channel Pacing Threshold Amplitude: 0.9 V
Lead Channel Pacing Threshold Pulse Width: 0.4 ms
Lead Channel Pacing Threshold Pulse Width: 0.4 ms
Lead Channel Sensing Intrinsic Amplitude: 7.4 mV
Lead Channel Sensing Intrinsic Amplitude: 8.6 mV
Lead Channel Setting Pacing Amplitude: 3.5 V
Lead Channel Setting Pacing Amplitude: 3.5 V
Lead Channel Setting Pacing Pulse Width: 0.4 ms
Lead Channel Setting Sensing Sensitivity: 2 mV
Pulse Gen Serial Number: 945603

## 2020-02-06 NOTE — Progress Notes (Signed)
Cardiology Office Note    Date:  02/09/2020   ID:  Seth Hardy, DOB Apr 07, 1946, MRN [id number]  PCP:  Seth Aus, MD  Cardiologist:  Seth Sacramento, MD  Electrophysiologist:  Seth Epley, MD   Chief Complaint: Follow-up  History of Present Illness:   Seth Hardy is a 74 y.o. male with history of CAD status post CABG in 01/2010 with subsequent BMS to the RCA in 2013 status post orbital atherectomy and PCI/DES to the distal RCA in 04/2018, persistent A. fib on Coumadin and amiodarone, sick sinus syndrome status post dual-chamber [employer] PPM on 01/26/2020, aortic stenosis, bilateral carotid artery disease status post left-sided CEA in 08/2015, DM2, HTN, HLD, iron deficiency anemia due to GI bleeding, and OSA on CPAP who presents for follow-up of CAD, A. fib, aortic stenosis, and recent PPM implantation.  He underwent four-vessel CABG in 01/2010 with LIMA to LAD, SVG to D1, SVG to OM1, and SVG to PDA.  He was admitted to the hospital in 02/2012 with an NSTEMI in the setting of A. fib with RVR.  LHC with PCI/BMS to the RCA.  More recently, he was seen in 03/2018 with worsening angina.  Echo at that time showed normal LVSF with his aortic stenosis noted to be moderate with a mean gradient of 27 mmHg.  Diagnostic R/LHC in 04/2018 showed significant underlying three-vessel CAD with patent LIMA to LAD and SVG to OM with known chronically occluded SVG to RCA.  The proximal RCA stent was patent with only mild ISR.  He was found to have severe calcified stenosis of the distal RCA.  RHC showed mildly elevated filling pressures with normal cardiac output.  His aortic stenosis was moderate with a mean gradient of 20 mmHg.  He underwent staged orbital atherectomy of the distal RCA with DES placement.  Echo in 07/2019 showed normal LVSF with an EF of 60-65%, mild LVH, Gr2DD, severely dilated left atrium, and stable moderate aortic stenosis with a mean gradient of 28 mmHg and a valve area of  1.35 cm.  Following this, he was hospitalized at [facility] in [place], [place] in 08/2019 after he presented with A. fib with RVR and an NSTEMI.  He underwent LHC which showed significant restenosis of the distal RCA stent which was treated with PCI/DES.    He was admitted to the hospital in early 12/2019 with recurrent Afib with RVR and elevated troponin.  He converted to sinus rhythm with IV amiodarone.  His elevated troponins were felt to be secondary to supply demand ischemia.  Echo during that admission showed 60-65%, moderate LVH, Gr2DD, mildly reduced RVSF with normal ventricular cavity size, mildly dilated left atrium, trileaflet aortic valve with known moderate stenosis with a mean gradient of 8 mmHg, peak gradient of 16.5 mmHg, and a valve area of 1.72 cm^2.  He was last seen in the office on 01/09/2020 with ongoing anginal symptoms felt to be multifactorial including underlying CAD, episodes of A. fib with RVR, aortic stenosis, and underlying anemia with recommendation to maximize medical therapy.  In this setting, Imdur 30 mg daily was added.  He was evaluated by EP on 01/10/2020 for symptomatic persistent A. fib complicated by bradycardic episodes on antiarrhythmic and beta-blocker therapy as well as underlying anemia with recommendation to proceed with dual-chamber pacemaker.  With regards to his underlying anemia he has required iron infusions as well as PRBC transfusions.  He has been evaluated by GI and  hematology with plans for bone marrow biopsy at some point as well.  He was admitted to the hospital from 01/11/2020 through 01/16/2020 with symptomatic anemia with melena.  Initial hemoglobin 6.5 with patient receiving 2 units of PRBC in the ED.  EGD showed gastric polyps as well as moderate gastric antral vascular ectasia with ongoing bleeding.  With cardiology input, Plavix was discontinued.  He underwent APC x 2 with cessation in ongoing GI bleed.  He was  placed on aspirin and warfarin at time of discharge.  He has subsequently undergone [employer] dual-chamber PPM implant on 01/26/2020 for sick sinus syndrome.  He followed up with the device clinic on 9/14 for wound check with noted some intermittent discomfort within the left arm/axilla with movement.  Patient was assessed by MD in this setting with symptoms felt to dissipate over the next several weeks.  He comes in today accompanied by his wife and is doing well from a cardiac perspective.  Today is their wedding anniversary.  They will be traveling back down to [facility] tomorrow to celebrate.  Since his PPM implantation he has done quite well.  He denies any chest pain, palpitations, dyspnea, dizziness presyncope, syncope, abdominal distention, orthopnea, PND, early satiety, falls, hematochezia, or melena.  His PPM pocket is healing well.  He did have an episode of emesis earlier this week that was without hematemesis.  In this setting, he did develop a petechial rash along his scalp and was evaluated by dermatology earlier today with symptoms felt to be related to his recent episode of emesis with no further work-up indicated.  Since he has last been seen he has undergone an iron infusion most recently on 01/30/2020.  He has follow-up with hematology early next month.  He has mild bilateral lower extremity pedal edema that typically improves when he is laying supine overnight.  He has not needed any as needed Lasix recently.  He is tolerating the recently added Imdur without issue.  He does not have any issues or concerns at this time.   Labs independently reviewed: 01/31/2020 - INR 2.2 01/2020 - Hgb 12.1, PLT 192, potassium 4.8, BUN 24, serum creatinine 1.29 12/2019 - magnesium 2.2, albumin 3.1, AST/ALT normal, A1c 5.7, TC 105, TG 123, HDL 36, LDL 44, TSH normal  Past Medical History:  Diagnosis Date  . Basal cell carcinoma of skin 2012   removed several spots from arms and back  . CAD  (coronary artery disease)    a. 01/2010 CABG x 4: LIMA->LAD, VG->D1, VG->OM1, VG->PDA. b. NSTEMI 02/2012 in setting of AF-RVR with cath s/p BMS to RCA (plan for 1 month, possibly up to 3 months of Plavix)  . Carotid disease, bilateral ([place])    a. 03/2011 U/S: 40-59% bilat Carotid dzs;  b. 04/2015 Carotid U/S: 40-59% bilat ICA stenosis; c. 05/2015 CTA Neck: 78% RICA, 29% LICA, mod-marked R vertebral stenosis, mod L vertebral stenosis.  . Chronic kidney disease    small obtusion per pcp. ultrasound done on 08/29/2015  . Diabetes mellitus type II, controlled ([place])    a. Variable CBG 02/2012 - several meds adjusted.  Marland Kitchen Dysrhythmia    intermittent Atrial Fibrillation  . GERD (gastroesophageal reflux disease)   . Heart murmur   . Hyperlipidemia   . Hypertension   . Hypertensive heart disease   . Iron deficiency anemia    "gets infusions q once in awhile" (04/27/2018)  . Iron deficiency anemia 08/2015   received 2 units rbc one week  ago, 3 IV iron infusion -last 1 week.  . Macular edema bil   lazer work done previously  . Mild aortic stenosis    a. 03/2011 Echo: EF 55-60%, Mild AS. b. Mild by cath 02/2012; c. 01/2014 Echo: EF 65-70%, Gr 1 DD, mild AS, mildly dil LA.  . Multiple gastric ulcers 2006  . Myocardial infarction ([place]) 02/2012   stents (x1) at time  . Myocardial infarction [facility])    "2nd one was after 02/2012; don't know date" (04/27/2018)  . Obesity   . On home oxygen therapy    "2.5 w/CPAP" (04/27/2018)  . OSA on CPAP    CPAP settings 3 with oxygen 2.5 (04/27/2018)  . PAF (paroxysmal atrial fibrillation) ([place])    a. Newly dx 02/2012 & initiated on Coumadin (spont converted to NSR).  . Shortness of breath dyspnea   . Transfusion history    transfusions -1 month ago -2 units    Past Surgical History:  Procedure Laterality Date  . ANGIOPLASTY    . BASAL CELL CARCINOMA EXCISION     "shoulder, arm X 2"  . CARDIAC CATHETERIZATION  2014  . CARDIAC CATHETERIZATION  2021   [facility] x1 stent  . CATARACT EXTRACTION W/ INTRAOCULAR LENS  IMPLANT, BILATERAL Bilateral 2016  . COLONOSCOPY WITH PROPOFOL N/A 10/15/2015   Procedure: COLONOSCOPY WITH PROPOFOL;  Surgeon: Gatha Mayer, MD;  Location: [facility] ENDOSCOPY;  Service: Endoscopy;  Laterality: N/A;  . CORONARY ANGIOPLASTY     Dr. Fletcher Anon  . CORONARY ARTERY BYPASS GRAFT  2011   CABG X3-4; [place], [place], [place],   . CORONARY ATHERECTOMY  04/27/2018  . CORONARY ATHERECTOMY N/A 04/27/2018   Procedure: CORONARY ATHERECTOMY;  Surgeon: Belva Crome, MD;  Location: [place] CV LAB;  Service: Cardiovascular;  Laterality: N/A;  . cyst L kidney  [place]  . ENDARTERECTOMY Left 08/30/2015   Procedure: ENDARTERECTOMY CAROTID;  Surgeon: Katha Cabal, MD;  Location: [facility] ORS;  Service: Vascular;  Laterality: Left;  . ESOPHAGOGASTRODUODENOSCOPY (EGD) WITH PROPOFOL N/A 10/15/2015   Procedure: ESOPHAGOGASTRODUODENOSCOPY (EGD) WITH PROPOFOL;  Surgeon: Gatha Mayer, MD;  Location: [facility] ENDOSCOPY;  Service: Endoscopy;  Laterality: N/A;  . ESOPHAGOGASTRODUODENOSCOPY (EGD) WITH PROPOFOL N/A 01/12/2020   Procedure: ESOPHAGOGASTRODUODENOSCOPY (EGD) WITH PROPOFOL;  Surgeon: Jerene Bears, MD;  Location: [facility] ENDOSCOPY;  Service: Gastroenterology;  Laterality: N/A;  . ESOPHAGOGASTRODUODENOSCOPY (EGD) WITH PROPOFOL N/A 01/15/2020   Procedure: ESOPHAGOGASTRODUODENOSCOPY (EGD) WITH PROPOFOL;  Surgeon: Ladene Artist, MD;  Location: [facility] ENDOSCOPY;  Service: Endoscopy;  Laterality: N/A;  . EYE SURGERY Bilateral    "has macular edema cauterized" (04/27/2018)  . HEMOSTASIS CLIP PLACEMENT  01/15/2020   Procedure: HEMOSTASIS CLIP PLACEMENT;  Surgeon: Ladene Artist, MD;  Location: [facility];  Service: Endoscopy;;  . HOT HEMOSTASIS N/A 01/12/2020   Procedure: HOT HEMOSTASIS (ARGON PLASMA COAGULATION/BICAP);  Surgeon: Jerene Bears, MD;  Location: [facility] ENDOSCOPY;  Service: Gastroenterology;  Laterality: N/A;  . HOT HEMOSTASIS  N/A 01/15/2020   Procedure: HOT HEMOSTASIS (ARGON PLASMA COAGULATION/BICAP);  Surgeon: Ladene Artist, MD;  Location: [facility] ENDOSCOPY;  Service: Endoscopy;  Laterality: N/A;  . INGUINAL HERNIA REPAIR Right   . LEFT HEART CATHETERIZATION WITH CORONARY ANGIOGRAM N/A 03/09/2012   Procedure: LEFT HEART CATHETERIZATION WITH CORONARY ANGIOGRAM;  Surgeon: Wellington Hampshire, MD;  Location: [facility] CATH LAB;  Service: Cardiovascular;  Laterality: N/A;  . PACEMAKER IMPLANT N/A 01/26/2020   Procedure: PACEMAKER IMPLANT;  Surgeon: Quentin Ore,  Hilton Cork, MD;  Location: [place] CV LAB;  Service: Cardiovascular;  Laterality: N/A;  . PERCUTANEOUS CORONARY STENT INTERVENTION (PCI-S)  03/09/2012   Procedure: PERCUTANEOUS CORONARY STENT INTERVENTION (PCI-S);  Surgeon: Wellington Hampshire, MD;  Location: [facility] CATH LAB;  Service: Cardiovascular;;  . POLYPECTOMY  01/15/2020   Procedure: POLYPECTOMY;  Surgeon: Ladene Artist, MD;  Location: [facility] ENDOSCOPY;  Service: Endoscopy;;  . RIGHT/LEFT HEART CATH AND CORONARY ANGIOGRAPHY N/A 04/25/2018   Procedure: RIGHT/LEFT HEART CATH AND CORONARY ANGIOGRAPHY;  Surgeon: Wellington Hampshire, MD;  Location: [place] CV LAB;  Service: Cardiovascular;  Laterality: N/A;    Current Medications: Current Meds  Medication Sig  . ALPRAZolam (XANAX) 0.25 MG tablet Take 0.25 mg by mouth daily as needed for anxiety.  Marland Kitchen amiodarone (PACERONE) 200 MG tablet Take 1 tablet (200 mg total) by mouth 2 (two) times daily for 6 days. Then take one tab po daily. (Patient taking differently: Take 200 mg by mouth daily. )  . amLODipine (NORVASC) 5 MG tablet Take one tablet by mouth one time daily  . aspirin EC 81 MG EC tablet Take 1 tablet (81 mg total) by mouth daily. Swallow whole.  . Cyanocobalamin (B-12) 1000 MCG CAPS Take 1,000 mcg by mouth every evening.   . Ferrous Gluconate-C-Folic Acid (IRON-C PO) Take 1 tablet by mouth daily.  . furosemide (LASIX) 20 MG tablet Take 20 mg by mouth daily as needed for  edema.  Marland Kitchen glimepiride (AMARYL) 4 MG tablet Take 4 mg by mouth daily.   . insulin glargine (LANTUS) 100 UNIT/ML injection Inject 15 Units into the skin daily.   . isosorbide mononitrate (IMDUR) 30 MG 24 hr tablet Take 1 tablet (30 mg total) by mouth daily.  Marland Kitchen NOVOLOG FLEXPEN 100 UNIT/ML FlexPen Inject 6-36 Units into the skin 2 (two) times daily with a meal.   . Omega-3 Fatty Acids (FISH OIL) 1000 MG CAPS Take 1,000 mg by mouth every evening.  . OXYGEN Inhale 2.5 L/min into the lungs at bedtime. With CPAP  . pantoprazole (PROTONIX) 40 MG tablet Take 1 tablet (40 mg total) by mouth daily.  . Polyethylene Glycol 400 (BLINK TEARS OP) Place 1 drop into both eyes daily as needed (dry eyes).  . ramipril (ALTACE) 10 MG capsule Take 10 mg by mouth daily.   . rosuvastatin (CRESTOR) 40 MG tablet TAKE ONE TABLET BY MOUTH EVERY DAY  . sucralfate (CARAFATE) 1 g tablet Take 1 g by mouth 2 (two) times daily.  . tadalafil (CIALIS) 10 MG tablet Take 10 mg by mouth as needed.  . vitamin C (ASCORBIC ACID) 500 MG tablet Take 500 mg by mouth every evening.  . warfarin (COUMADIN) 5 MG tablet Take 2.5 mg by mouth daily.     Allergies:   Patient has no known allergies.   Social History   Socioeconomic History  . Marital status: Married    Spouse name: Not on file  . Number of children: 2  . Years of education: Not on file  . Highest education level: Not on file  Occupational History  . Occupation: retired  Tobacco Use  . Smoking status: Never Smoker  . Smokeless tobacco: Never Used  . Tobacco comment: tobacco use - no.no passive smoke in home  Vaping Use  . Vaping Use: Never used  Substance and Sexual Activity  . Alcohol use: Yes    Comment: 04/27/2018 "couple drinks/year; if that"  . Drug use: Never  . Sexual activity: Not Currently  Other Topics Concern  . Not on file  Social History Narrative   Full time. Gets regular exercise. Lives in [place] with his wife.  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   Social Determinants of Radio broadcast assistant Strain:   . Difficulty of Paying Living Expenses: Not on file  Food Insecurity:   . Worried About Charity fundraiser in the Last Year: Not on file  . Ran Out of Food in the Last Year: Not on file  Transportation Needs:   . Lack of Transportation (Medical): Not on file  . Lack of Transportation (Non-Medical): Not on file  Physical Activity:   . Days of Exercise per Week: Not on file  . Minutes of Exercise per Session: Not on file  Stress:   . Feeling of Stress : Not on file  Social Connections:   . Frequency of Communication with Friends and Family: Not on file  . Frequency of Social Gatherings with Friends and Family: Not on file  . Attends Religious Services: Not on file  . Active Member of Clubs or Organizations: Not on file  . Attends Archivist Meetings: Not on file  . Marital Status: Not on file     Family History:  [name] family history includes Bladder Cancer in his mother; COPD in his mother; Cancer in an other family member; Heart attack in his father; Kidney disease in his paternal uncle; Liver cancer in his maternal aunt; Lung cancer in his maternal aunt. There is no history of Prostate cancer or Kidney cancer.  ROS:   Review of Systems  Constitutional: Positive for malaise/fatigue. Negative for chills, diaphoresis, fever and weight loss.  HENT: Negative for congestion.   Eyes: Negative for discharge and redness.  Respiratory: Negative for cough, hemoptysis, sputum production, shortness of breath and wheezing.   Cardiovascular: Positive for leg swelling. Negative for chest pain, palpitations, orthopnea, claudication and PND.  Gastrointestinal: Positive for nausea and vomiting. Negative for abdominal pain, blood in stool, heartburn and melena.  Genitourinary: Negative for hematuria.  Musculoskeletal: Negative for falls and myalgias.  Skin: Positive for rash.  Neurological: Negative for  dizziness, tingling, tremors, sensory change, speech change, focal weakness, loss of consciousness and weakness.  Endo/Heme/Allergies: Does not bruise/bleed easily.  Psychiatric/Behavioral: Negative for substance abuse. The patient is not nervous/anxious.   All other systems reviewed and are negative.    EKGs/Labs/Other Studies Reviewed:    Studies reviewed were summarized above. The additional studies were reviewed today: As above.  EKG:  EKG is ordered today.  The EKG ordered today demonstrates atrial paced rhythm, 60 bpm  Recent Labs: 12/26/2019: TSH 3.237 01/11/2020: ALT 27 01/13/2020: Magnesium 2.2 01/30/2020: BUN 24; Creatinine, Ser 1.29; Hemoglobin 12.1; Platelets 192; Potassium 4.8; Sodium 137  Recent Lipid Panel    Component Value Date/Time   CHOL 105 12/26/2019 0813   TRIG 123 12/26/2019 0813   HDL 36 (L) 12/26/2019 0813   CHOLHDL 2.9 12/26/2019 0813   VLDL 25 12/26/2019 0813   LDLCALC 44 12/26/2019 0813    PHYSICAL EXAM:    VS:  BP 120/60 (BP Location: Left Arm, Patient Position: Sitting, Cuff Size: Normal)   Pulse 60   Ht _0  (1.727 m)   Wt 181 lb (82.1 kg)   SpO2 98%   BMI 27.52 kg/m   BMI: Body mass index is 27.52 kg/m.  Physical Exam Constitutional:      Appearance: He is well-developed.  HENT:     Head:  Normocephalic and atraumatic.  Eyes:     General:        Right eye: No discharge.        Left eye: No discharge.  Neck:     Vascular: No JVD.  Cardiovascular:     Rate and Rhythm: Normal rate and regular rhythm.     Pulses: No midsystolic click and no opening snap.          Dorsalis pedis pulses are 2+ on the right side and 2+ on the left side.       Posterior tibial pulses are 2+ on the right side and 2+ on the left side.     Heart sounds: S1 normal and S2 normal. Heart sounds not distant. Murmur heard.  Harsh midsystolic murmur is present with a grade of 2/6 at the upper right sternal border radiating to the neck.  No friction rub.  Pulmonary:       Effort: Pulmonary effort is normal. No respiratory distress.     Breath sounds: Normal breath sounds. No decreased breath sounds, wheezing or rales.  Chest:     Chest wall: No tenderness.  Abdominal:     General: There is no distension.     Palpations: Abdomen is soft.     Tenderness: There is no abdominal tenderness.  Musculoskeletal:     Cervical back: Normal range of motion.     Right lower leg: Edema present.     Left lower leg: Edema present.     Comments: Trace bilateral pedal edema  Skin:    General: Skin is warm and dry.     Nails: There is no clubbing.  Neurological:     Mental Status: He is alert and oriented to person, place, and time.  Psychiatric:        Speech: Speech normal.        Behavior: Behavior normal.        Thought Content: Thought content normal.        Judgment: Judgment normal.     Wt Readings from Last 3 Encounters:  02/09/20 181 lb (82.1 kg)  01/30/20 180 lb (81.6 kg)  01/26/20 179 lb (81.2 kg)     ASSESSMENT & PLAN:   1. CAD status post CABG status post PCI without angina: He comes in doing quite well today without any symptoms concerning for worsening angina.  Overall, this has been a complex situation with his recent worsening anginal symptoms occurring in the multifactorial setting of episodes of A. fib with RVR and underlying anemia with prior GI bleeding.  Fortunately, since he has last been seen he has not had any further episodes of chest pain.  He is tolerating the recently added Imdur without issues which will be continued.  He has no longer on Plavix secondary to recurrent GI bleed status post APC x2 last month.  He remains on dual therapy with aspirin and warfarin with close monitoring of his and underlying anemia.  He will also continue amlodipine for antianginal effect.  Continue to optimize medical therapy as indicated.  2. Persistent A. Fib: No further symptoms concerning for recurrent atrial arrhythmia since he was last seen.  This  too has been a complex situation as he has had recurrent episodes of A. fib with RVR despite treatment with amiodarone.  Historically, he has a bradycardic heart rate when in sinus rhythm which has precluded escalation of rate control strategy.  He has subsequently undergone dual-chamber PPM 2 weeks ago for sick sinus  syndrome.  With this, as needed, we can escalate his medical therapy to minimize his A. fib burden which should also minimize his anginal burden.  I did provide him with a prescription for Lopressor 25 mg to be taken twice daily as needed for sustained tachypalpitations now that he has pacemaker backup.  He should also continue amiodarone and Coumadin.  He does have history of GI bleeding though denies any recent symptoms.  We will obtain a CBC today.  CHA2DS2-VASc at least 4 (HTN, age x1, DM, vascular disease).  3. Sick sinus syndrome: Status post dual-chamber PPM.  Device appears to be functioning normally.  Device pocket is healing well.  Followed by EP.  4. Aortic stenosis: Moderate and stable on most recent echo from 07/2019.  Follow-up echo in approximately 6 months time.  Contributing to his symptoms of angina which have been exacerbated by underlying anemia which will hopefully now be under better control following recent APC x2 on recent EGD.  5. Bilateral carotid artery stenosis: Status post left-sided carotid endarterectomy.  Followed by vascular surgery.  6. HTN: Blood pressure is well controlled.  He remains on ramipril, amlodipine and Imdur.  He has not needed as needed Lasix recently.  7. HLD: LDL 44 from 12/2019 with normal LFT at that time.  He remains on Crestor.  8. Iron deficiency anemia with GI bleed: Followed by hematology and gastroenterology.  Check CBC.  Disposition: F/u with Dr. Fletcher Anon or an APP in 2 months, and EP as directed.   Medication Adjustments/Labs and Tests Ordered: Current medicines are reviewed at length with the patient today.  Concerns regarding  medicines are outlined above. Medication changes, Labs and Tests ordered today are summarized above and listed in the Patient Instructions accessible in Encounters.   Signed, Christell Faith, PA-C 02/09/2020 4:11 PM     [address] [place], [place] [address] [phone]

## 2020-02-06 NOTE — Progress Notes (Signed)
Wound check appointment. Steri-strips removed. Wound without redness or edema. Incision edges approximated, wound well healed. Patient reported intermittent discomfort at left arm/axilla when using arm. Dr. Quentin Ore in to assess, advised patient that discomfort will likely dissipate over next few weeks, no new orders received. Normal device function. Thresholds, sensing, and impedances consistent with implant measurements. Device programmed at 3.5V with auto capture on monitor for extra safety margin until 3 month visit. Histogram distribution appropriate for patient and level of activity. No mode switches or high ventricular rates noted. Patient educated about wound care, arm mobility, lifting restrictions, and Latitude monitor. Latitude on 04/30/20 and ROV with Dr. Quentin Ore in [place] on 05/01/20.

## 2020-02-09 ENCOUNTER — Telehealth: Payer: Self-pay | Admitting: Internal Medicine

## 2020-02-09 ENCOUNTER — Other Ambulatory Visit: Payer: Self-pay

## 2020-02-09 ENCOUNTER — Ambulatory Visit (INDEPENDENT_AMBULATORY_CARE_PROVIDER_SITE_OTHER): Payer: PPO | Admitting: Physician Assistant

## 2020-02-09 ENCOUNTER — Encounter: Payer: Self-pay | Admitting: Physician Assistant

## 2020-02-09 ENCOUNTER — Other Ambulatory Visit: Payer: PPO

## 2020-02-09 ENCOUNTER — Telehealth: Payer: Self-pay

## 2020-02-09 VITALS — BP 120/60 | HR 60 | Ht 68.0 in | Wt 181.0 lb

## 2020-02-09 DIAGNOSIS — I350 Nonrheumatic aortic (valve) stenosis: Secondary | ICD-10-CM

## 2020-02-09 DIAGNOSIS — I6529 Occlusion and stenosis of unspecified carotid artery: Secondary | ICD-10-CM

## 2020-02-09 DIAGNOSIS — Z8719 Personal history of other diseases of the digestive system: Secondary | ICD-10-CM

## 2020-02-09 DIAGNOSIS — D509 Iron deficiency anemia, unspecified: Secondary | ICD-10-CM | POA: Diagnosis not present

## 2020-02-09 DIAGNOSIS — I2510 Atherosclerotic heart disease of native coronary artery without angina pectoris: Secondary | ICD-10-CM

## 2020-02-09 DIAGNOSIS — I4819 Other persistent atrial fibrillation: Secondary | ICD-10-CM | POA: Diagnosis not present

## 2020-02-09 DIAGNOSIS — I10 Essential (primary) hypertension: Secondary | ICD-10-CM

## 2020-02-09 DIAGNOSIS — R002 Palpitations: Secondary | ICD-10-CM | POA: Diagnosis not present

## 2020-02-09 DIAGNOSIS — I495 Sick sinus syndrome: Secondary | ICD-10-CM | POA: Diagnosis not present

## 2020-02-09 DIAGNOSIS — E785 Hyperlipidemia, unspecified: Secondary | ICD-10-CM | POA: Diagnosis not present

## 2020-02-09 DIAGNOSIS — D692 Other nonthrombocytopenic purpura: Secondary | ICD-10-CM | POA: Diagnosis not present

## 2020-02-09 DIAGNOSIS — D500 Iron deficiency anemia secondary to blood loss (chronic): Secondary | ICD-10-CM

## 2020-02-09 MED ORDER — METOPROLOL TARTRATE 25 MG PO TABS: ORAL_TABLET | ORAL | 3 refills | Status: AC

## 2020-02-09 NOTE — Telephone Encounter (Signed)
Patient phoned stating that he appears to have a rash and spoke with dr, who said that he should talk with his hematologist. There were a couple of other things he talked about (recent pacemaker placed among others). Please call him at [phone].

## 2020-02-09 NOTE — Patient Instructions (Signed)
Medication Instructions:  - Your physician has recommended you make the following change in your medication:   1) Start lopressor (metoprolol tartrate) 25 mg- take 1 tablet by mouth twice daily as needed for palpitations  *If you need a refill on your cardiac medications before your next appointment, please call your pharmacy*   Lab Work: - Your physician recommends that you have lab work today: CBC  If you have labs (blood work) drawn today and your tests are completely normal, you will receive your results only by: Marland Kitchen MyChart Message (if you have MyChart) OR . A paper copy in the mail If you have any lab test that is abnormal or we need to change your treatment, we will call you to review the results.   Testing/Procedures: - none ordered   Follow-Up: At [facility], you and your health needs are our priority.  As part of our continuing mission to provide you with exceptional heart care, we have created designated Provider Care Teams.  These Care Teams include your primary Cardiologist (physician) and Advanced Practice Providers (APPs -  Physician Assistants and Nurse Practitioners) who all work together to provide you with the care you need, when you need it.  We recommend signing up for the patient portal called "MyChart".  Sign up information is provided on this After Visit Summary.  MyChart is used to connect with patients for Virtual Visits (Telemedicine).  Patients are able to view lab/test results, encounter notes, upcoming appointments, etc.  Non-urgent messages can be sent to your provider as well.   To learn more about what you can do with MyChart, go to [email or web address].    Your next appointment:   1 month(s)  The format for your next appointment:   In Person  Provider:   Kathlyn Sacramento, MD   Other Instructions n/a

## 2020-02-09 NOTE — Telephone Encounter (Signed)
Per Dr. Jacinto Reap. Pt can get a CBC done. Order has been entered.   S-Can you please schedule pt for labs today.

## 2020-02-09 NOTE — Telephone Encounter (Signed)
Patient phoned on this date and stated that he saw his dermatologist on this date and that dermatologist stated that labs today were not needed. Patient cancelled labs for this date and will keep further appts.

## 2020-02-09 NOTE — Telephone Encounter (Signed)
Called pt in regards to message below. Pt states that he has a rash that started yesterday morning when he woke up that started on his forehead and moved down to the corner of his eye right at cheek bone area. Pt states the rash does not itch and does not hurt. Did have a pacemaker put in 2 weeks ago. States that Tuesday night of this week got sick and vomited and then woke up with the rash the next morning. Spoke with PA. Renato Gails with [facility] who told him he needed to follow up with Dr. B to have platelets checked. Patient does have an appointment with dermatology as well at 1:30 today. Do you want patient to have any extra blood work done? Please advise Dr. Jacinto Reap.

## 2020-02-09 NOTE — Telephone Encounter (Signed)
02/09/2020  Called and spoke with pt, labs scheduled for today @ 2 pm  srw

## 2020-02-10 LAB — CBC WITH DIFFERENTIAL/PLATELET
Basophils Absolute: 0 x10E3/uL (ref 0.0–0.2)
Basos: 1 %
EOS (ABSOLUTE): 0.1 x10E3/uL (ref 0.0–0.4)
Eos: 3 %
Hematocrit: 33.9 % — ABNORMAL LOW (ref 37.5–51.0)
Hemoglobin: 10.7 g/dL — ABNORMAL LOW (ref 13.0–17.7)
Immature Grans (Abs): 0 x10E3/uL (ref 0.0–0.1)
Immature Granulocytes: 0 %
Lymphocytes Absolute: 0.9 x10E3/uL (ref 0.7–3.1)
Lymphs: 22 %
MCH: 31.8 pg (ref 26.6–33.0)
MCHC: 31.6 g/dL (ref 31.5–35.7)
MCV: 101 fL — ABNORMAL HIGH (ref 79–97)
Monocytes Absolute: 0.5 x10E3/uL (ref 0.1–0.9)
Monocytes: 11 %
Neutrophils Absolute: 2.5 x10E3/uL (ref 1.4–7.0)
Neutrophils: 63 %
Platelets: 185 x10E3/uL (ref 150–450)
RBC: 3.37 x10E6/uL — ABNORMAL LOW (ref 4.14–5.80)
RDW: 13.7 % (ref 11.6–15.4)
WBC: 3.9 x10E3/uL (ref 3.4–10.8)

## 2020-02-13 ENCOUNTER — Telehealth: Payer: Self-pay | Admitting: Cardiovascular Disease

## 2020-02-13 NOTE — Telephone Encounter (Signed)
Spoke with the patient. Reviewed the results of the patients most recent cbc.  Patient sts that he is concerned about the drop in his hemoglobin. He denies any signs of bleeding. No dark tarry stools.  Adv the patient of Christell Faith, [place] recommendation. Rise Mu, PA-C  02/11/2020 6:29 PM EDT     Blood count is low, though stable. Follow up with hematology as directed.    I did fwd the cbc results to the patients hematologist Dr. Rogue Bussing. Adv the patient that if he has not heard from hematology in a day or 2 he should contact their office.  Patient verbalized understanding and voiced appreciation for the call back.

## 2020-02-13 NOTE — Telephone Encounter (Signed)
Patient has questions about most recent lab results.

## 2020-02-14 ENCOUNTER — Ambulatory Visit: Payer: PPO | Admitting: Nurse Practitioner

## 2020-02-19 ENCOUNTER — Other Ambulatory Visit (INDEPENDENT_AMBULATORY_CARE_PROVIDER_SITE_OTHER): Payer: PPO

## 2020-02-19 ENCOUNTER — Telehealth: Payer: Self-pay | Admitting: Physician Assistant

## 2020-02-19 ENCOUNTER — Other Ambulatory Visit: Payer: Self-pay

## 2020-02-19 DIAGNOSIS — K921 Melena: Secondary | ICD-10-CM

## 2020-02-19 DIAGNOSIS — K31819 Angiodysplasia of stomach and duodenum without bleeding: Secondary | ICD-10-CM

## 2020-02-19 DIAGNOSIS — Z7901 Long term (current) use of anticoagulants: Secondary | ICD-10-CM

## 2020-02-19 LAB — CBC WITH DIFFERENTIAL/PLATELET
Basophils Absolute: 0 10*3/uL (ref 0.0–0.1)
Basophils Relative: 0.7 % (ref 0.0–3.0)
Eosinophils Absolute: 0.1 10*3/uL (ref 0.0–0.7)
Eosinophils Relative: 3.6 % (ref 0.0–5.0)
HCT: 33.8 % — ABNORMAL LOW (ref 39.0–52.0)
Hemoglobin: 11.3 g/dL — ABNORMAL LOW (ref 13.0–17.0)
Lymphocytes Relative: 24.3 % (ref 12.0–46.0)
Lymphs Abs: 0.9 10*3/uL (ref 0.7–4.0)
MCHC: 33.4 g/dL (ref 30.0–36.0)
MCV: 97.8 fl (ref 78.0–100.0)
Monocytes Absolute: 0.4 10*3/uL (ref 0.1–1.0)
Monocytes Relative: 12.2 % — ABNORMAL HIGH (ref 3.0–12.0)
Neutro Abs: 2.1 10*3/uL (ref 1.4–7.7)
Neutrophils Relative %: 59.2 % (ref 43.0–77.0)
Platelets: 185 10*3/uL (ref 150.0–400.0)
RBC: 3.46 Mil/uL — ABNORMAL LOW (ref 4.22–5.81)
RDW: 16.3 % — ABNORMAL HIGH (ref 11.5–15.5)
WBC: 3.6 10*3/uL — ABNORMAL LOW (ref 4.0–10.5)

## 2020-02-19 LAB — PROTIME-INR
INR: 2.4 ratio — ABNORMAL HIGH (ref 0.8–1.0)
Prothrombin Time: 26.9 s — ABNORMAL HIGH (ref 9.6–13.1)

## 2020-02-19 NOTE — Telephone Encounter (Signed)
Patient will come now for labs.

## 2020-02-19 NOTE — Telephone Encounter (Signed)
CBC, PT/INR today

## 2020-02-19 NOTE — Telephone Encounter (Signed)
Doc of the Day  Patient of Dr Bryan Lemma. He has a recent history of GI bleed GAVE. Last EGD in August during hospitalization. Post hospitalization hgb 12.1.  CBC 02/09/20 was 10.7. At that time he did not have any dark stools. He is on iron. Calls today with report of dark stools since Friday. He has an appointment with [place] on 02/21/20. Do you want to get a CBC today?

## 2020-02-20 DIAGNOSIS — G4733 Obstructive sleep apnea (adult) (pediatric): Secondary | ICD-10-CM | POA: Diagnosis not present

## 2020-02-21 ENCOUNTER — Encounter: Payer: Self-pay | Admitting: Physician Assistant

## 2020-02-21 ENCOUNTER — Ambulatory Visit (INDEPENDENT_AMBULATORY_CARE_PROVIDER_SITE_OTHER): Payer: PPO | Admitting: Physician Assistant

## 2020-02-21 VITALS — BP 110/68 | HR 70 | Ht 68.0 in | Wt 181.0 lb

## 2020-02-21 DIAGNOSIS — Z862 Personal history of diseases of the blood and blood-forming organs and certain disorders involving the immune mechanism: Secondary | ICD-10-CM | POA: Diagnosis not present

## 2020-02-21 DIAGNOSIS — G4733 Obstructive sleep apnea (adult) (pediatric): Secondary | ICD-10-CM | POA: Diagnosis not present

## 2020-02-21 DIAGNOSIS — K922 Gastrointestinal hemorrhage, unspecified: Secondary | ICD-10-CM

## 2020-02-21 DIAGNOSIS — K31819 Angiodysplasia of stomach and duodenum without bleeding: Secondary | ICD-10-CM

## 2020-02-21 NOTE — Patient Instructions (Signed)
If you are age 74 or older, your body mass index should be between 23-30. Your Body mass index is 27.52 kg/m. If this is out of the aforementioned range listed, please consider follow up with your Primary Care Provider.  If you are age 12 or younger, your body mass index should be between 19-25. Your Body mass index is 27.52 kg/m. If this is out of the aformentioned range listed, please consider follow up with your Primary Care Provider.   We have placed a standing order for you to a lab work to check your blood count every Monday, starting this Monday. You can go to a [facility] draw station located at: [address] [place] [address]  DECREASE your Pantoprazole to once a day before breakfast.  INCREASE your Iron to Twice a day with Food.  RESTART your Coumadin.  Follow up as needed or pending and issues with your labs.

## 2020-02-21 NOTE — Progress Notes (Signed)
Subjective:    Patient ID: Seth Hardy, male    DOB: [birth year], 74 y.o.   MRN: [id number]  HPI Seth Hardy is a pleasant 74 year old white male, established with Dr. Carlean Purl who comes in today for post hospital follow-up after recent hospitalization with profound iron deficiency anemia and melena. Patient was admitted after office visit with GI on 01/11/2020 when he presented with a 3 g drop in hemoglobin over the prior 2 weeks, extremely symptomatic with fatigue, dyspnea and anginal symptoms.  He also had a supratherapeutic INR at that time of 5.3 and was on Coumadin and Plavix. He has history of coronary artery disease status post MI earlier this year with stent placement and initiation of Plavix, atrial fibrillation for which she is on chronic Coumadin, carotid artery disease, adult onset diabetes mellitus, sleep apnea with oxygen requirement at bedtime, he had a remote CABG. 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 Patient underwent EGD on 01/12/2020 with finding of Gave with active oozing and was treated with APC.  Was also noted to have 2 small gastric polyps which were not removed at that time as he had still been on Plavix. He had repeat EGD on 01/15/2020 with removal of the 2 small gastric polyps these were clipped.  Biopsies consistent with hyperplastic polyps.  He was also treated with further APC of what was felt to be GAVE. Decision was made to leave the patient off of Plavix, and he continues on baby aspirin daily and Coumadin.  He was discharged on twice daily PPI for 1 month then daily thereafter. On discharge from the hospital hemoglobin was 12.1.  Repeat hemoglobin on 02/09/2020 10.7.  Repeat labs on 02/19/2020 hemoglobin 11.3 stable INR 2.4. Patient did receive a Feraheme infusion during his admission and had 1 iron infusion after discharge.  He will follow-up with [facility] in December. He says he has been feeling well since discharge from the hospital,  no further anginal symptoms or shortness of breath and energy level has been good.  No complaints of abdominal discomfort.  He did have an episode last week where he had 2 dark stools while he was at the beach.  Apparently he had some mild abdominal discomfort and nausea that day as well.  He is not seeing any further melena and stools have been appearing very normal.  His wife is very concerned about ongoing bleeding.  She also asked about capsule endoscopy today.  Last colonoscopy was done in 2017 and negative.  Review of Systems Pertinent positive and negative review of systems were noted in the above HPI section.  All other review of systems was otherwise negative.  Outpatient Encounter Medications as of 02/21/2020  Medication Sig  . ALPRAZolam (XANAX) 0.25 MG tablet Take 0.25 mg by mouth daily as needed for anxiety.  Marland Kitchen amLODipine (NORVASC) 5 MG tablet Take one tablet by mouth one time daily  . aspirin EC 81 MG EC tablet Take 1 tablet (81 mg total) by mouth daily. Swallow whole.  . Cyanocobalamin (B-12) 1000 MCG CAPS Take 1,000 mcg by mouth every evening.   . Ferrous Gluconate-C-Folic Acid (IRON-C PO) Take 1 tablet by mouth daily.  . furosemide (LASIX) 20 MG tablet Take 20 mg by mouth daily as needed for edema.  Marland Kitchen glimepiride (AMARYL) 4 MG tablet Take 4 mg by mouth daily.   . insulin glargine (LANTUS) 100 UNIT/ML injection Inject 15 Units into the skin daily.   . isosorbide mononitrate (IMDUR)  30 MG 24 hr tablet Take 1 tablet (30 mg total) by mouth daily.  . metoprolol tartrate (LOPRESSOR) 25 MG tablet Take 1 tablet (25 mg) by mouth twice daily as needed for palpitations  . NOVOLOG FLEXPEN 100 UNIT/ML FlexPen Inject 6-36 Units into the skin 2 (two) times daily with a meal.   . Omega-3 Fatty Acids (FISH OIL) 1000 MG CAPS Take 1,000 mg by mouth every evening.  . OXYGEN Inhale 2.5 L/min into the lungs at bedtime. With CPAP  . Polyethylene Glycol 400 (BLINK TEARS OP) Place 1 drop into both eyes  daily as needed (dry eyes).  . ramipril (ALTACE) 10 MG capsule Take 10 mg by mouth daily.   . rosuvastatin (CRESTOR) 40 MG tablet TAKE ONE TABLET BY MOUTH EVERY DAY  . sucralfate (CARAFATE) 1 g tablet Take 1 g by mouth 2 (two) times daily.  . vitamin C (ASCORBIC ACID) 500 MG tablet Take 500 mg by mouth every evening.  . warfarin (COUMADIN) 5 MG tablet Take 2.5 mg by mouth daily.   Marland Kitchen amiodarone (PACERONE) 200 MG tablet Take 1 tablet (200 mg total) by mouth 2 (two) times daily for 6 days. Then take one tab po daily. (Patient taking differently: Take 200 mg by mouth daily. )  . pantoprazole (PROTONIX) 40 MG tablet Take 1 tablet (40 mg total) by mouth daily.  . tadalafil (CIALIS) 10 MG tablet Take 10 mg by mouth as needed.   No facility-administered encounter medications on file as of 02/21/2020.   No Known Allergies Patient Active Problem List   Diagnosis Date Noted  . Heme positive stool   . Melena   . GAVE (gastric antral vascular ectasia)   . UGI bleed 01/11/2020  . Atrial fibrillation with RVR ([place])   . Rapid atrial fibrillation ([place]) 12/26/2019  . CAD (coronary artery disease), native coronary artery 04/27/2018  . Effort angina ([facility])   . Benign essential hypertension 02/11/2016  . Microcytic anemia   . Gastric polyps   . BP (high blood pressure) 09/25/2015  . Aortic heart valve narrowing 09/23/2015  . Carotid artery stenosis with cerebral infarction ([place]) 08/30/2015  . Iron deficiency anemia due to chronic blood loss 08/21/2015  . B12 deficiency 08/19/2015  . Carotid disease, bilateral ([place])   . CAD (coronary artery disease)   . Mild aortic stenosis   . Hypertensive heart disease   . Diabetes mellitus type II, controlled ([place])   . Hyperlipidemia 03/14/2015  . Carotid stenosis, asymptomatic, left 09/13/2014  . 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 History of atrial fibrillation 01/04/2014  . Obstructive apnea 01/04/2014  .  Atrial fibrillation ([place]) 03/23/2012  . Long term (current) use of anticoagulants 03/14/2012  . Non-STEMI (non-ST elevated myocardial infarction) ([place]) 03/09/2012  . Carotid stenosis 05/28/2010  . CAD, ARTERY BYPASS GRAFT 04/07/2010  . CAD, NATIVE VESSEL 03/13/2009  . Aortic valve stenosis, mild 11/01/2008   Social History   Socioeconomic History  . Marital status: Married    Spouse name: Seth Hardy  . Number of children: 2  . Years of education: Not on file  . Highest education level: Not on file  Occupational History  . Occupation: retired  Tobacco Use  . Smoking status: Never Smoker  . Smokeless tobacco: Never Used  . Tobacco comment: tobacco use - no.no passive smoke in home  Vaping Use  . Vaping Use: Never used  Substance and Sexual Activity  . Alcohol use: Yes  Comment: 04/27/2018 "couple drinks/year; if that"  . Drug use: Never  . Sexual activity: Not Currently  Other Topics Concern  . Not on file  Social History Narrative   Full time. Gets regular exercise. Lives in [place] with his wife.  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   Social Determinants of Radio broadcast assistant Strain:   . Difficulty of Paying Living Expenses: Not on file  Food Insecurity:   . Worried About Charity fundraiser in the Last Year: Not on file  . Ran Out of Food in the Last Year: Not on file  Transportation Needs:   . Lack of Transportation (Medical): Not on file  . Lack of Transportation (Non-Medical): Not on file  Physical Activity:   . Days of Exercise per Week: Not on file  . Minutes of Exercise per Session: Not on file  Stress:   . Feeling of Stress : Not on file  Social Connections:   . Frequency of Communication with Friends and Family: Not on file  . Frequency of Social Gatherings with Friends and Family: Not on file  . Attends Religious Services: Not on file  . Active Member of Clubs or Organizations: Not on file  . Attends Archivist Meetings: Not on file  .  Marital Status: Not on file  Intimate Partner Violence:   . Fear of Current or Ex-Partner: Not on file  . Emotionally Abused: Not on file  . Physically Abused: Not on file  . Sexually Abused: Not on file    Mr. Kurowski's family history includes Bladder Cancer in his mother; COPD in his mother; Cancer in an other family member; Heart attack in his father; Kidney disease in his paternal uncle; Liver cancer in his maternal aunt; Lung cancer in his maternal aunt.      Objective:    Vitals:   02/21/20 1419  BP: 110/68  Pulse: 70    Physical Exam Well-developed well-nourished older white male in no acute distress.  Accompanied by his wife height, Weight, 181 BMI 27.52  HEENT; nontraumatic normocephalic, EOMI, PE RR LA, sclera anicteric. Oropharynx; not examined today  Extremities; no clubbing cyanosis or edema skin warm and dry Neuro/Psych; alert and oriented x4, grossly nonfocal mood and affect appropriate       Assessment & Plan:   #31 74 year old white male status post recent hospitalization 01/11/2020 through 8/24 2021 with profound anemia, iron deficiency, melena, markedly symptomatic with anginal symptoms. Patient found to have evidence of G AVE at endoscopy which was treated with APC.  He had a second EGD during that admission with removal of 2 small gastric polyps which were benign hyperplastic polyps and had further treatment with APC. He has been stable since discharge, and hemoglobin overall stable.  2 dark stools last week, no recurrence and hemoglobin stable 48 hours ago.  #2 chronic anticoagulation with Coumadin 3.  History of atrial fibrillation 4.  Coronary artery disease status post remote CABG and recent MI about 5 months ago with stent placement and initiation of Plavix which has since been discontinued at his hospitalization. 5.  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  Adult onset diabetes mellitus 7.  Sleep apnea with nocturnal oxygen  requirement  Plan #1 continue oral iron 65 mg , increase to twice daily with food if he can tolerate. Decrease Protonix to 40 mg p.o. every morning AC breakfast, Patient had held his Coumadin over the past 2 days-resume this evening We will plan to  follow serial hemoglobins weekly over the next several weeks and then if stable q. 2 to 3 weeks. I discussed possible capsule endoscopy and repeat EGD should he develop melena and drop in hemoglobin over the coming weeks. We will continue hematology follow-up in [facility] with follow-up visit in December.[place] Nurse [place] PA-C 02/21/2020   Cc: Rusty Aus, MD

## 2020-02-27 ENCOUNTER — Other Ambulatory Visit: Payer: Self-pay

## 2020-02-27 ENCOUNTER — Inpatient Hospital Stay: Payer: PPO | Attending: Oncology

## 2020-02-27 DIAGNOSIS — G4733 Obstructive sleep apnea (adult) (pediatric): Secondary | ICD-10-CM | POA: Insufficient documentation

## 2020-02-27 DIAGNOSIS — Z7901 Long term (current) use of anticoagulants: Secondary | ICD-10-CM | POA: Diagnosis not present

## 2020-02-27 DIAGNOSIS — I119 Hypertensive heart disease without heart failure: Secondary | ICD-10-CM | POA: Diagnosis not present

## 2020-02-27 DIAGNOSIS — K219 Gastro-esophageal reflux disease without esophagitis: Secondary | ICD-10-CM | POA: Insufficient documentation

## 2020-02-27 DIAGNOSIS — Z950 Presence of cardiac pacemaker: Secondary | ICD-10-CM | POA: Diagnosis not present

## 2020-02-27 DIAGNOSIS — N189 Chronic kidney disease, unspecified: Secondary | ICD-10-CM | POA: Diagnosis not present

## 2020-02-27 DIAGNOSIS — D509 Iron deficiency anemia, unspecified: Secondary | ICD-10-CM | POA: Diagnosis not present

## 2020-02-27 DIAGNOSIS — E785 Hyperlipidemia, unspecified: Secondary | ICD-10-CM | POA: Diagnosis not present

## 2020-02-27 DIAGNOSIS — Z85828 Personal history of other malignant neoplasm of skin: Secondary | ICD-10-CM | POA: Diagnosis not present

## 2020-02-27 DIAGNOSIS — D500 Iron deficiency anemia secondary to blood loss (chronic): Secondary | ICD-10-CM

## 2020-02-27 DIAGNOSIS — I252 Old myocardial infarction: Secondary | ICD-10-CM | POA: Diagnosis not present

## 2020-02-27 DIAGNOSIS — Z8673 Personal history of transient ischemic attack (TIA), and cerebral infarction without residual deficits: Secondary | ICD-10-CM | POA: Diagnosis not present

## 2020-02-27 DIAGNOSIS — I2510 Atherosclerotic heart disease of native coronary artery without angina pectoris: Secondary | ICD-10-CM | POA: Diagnosis not present

## 2020-02-27 DIAGNOSIS — E118 Type 2 diabetes mellitus with unspecified complications: Secondary | ICD-10-CM | POA: Insufficient documentation

## 2020-02-27 DIAGNOSIS — I480 Paroxysmal atrial fibrillation: Secondary | ICD-10-CM | POA: Insufficient documentation

## 2020-02-27 DIAGNOSIS — I10 Essential (primary) hypertension: Secondary | ICD-10-CM | POA: Insufficient documentation

## 2020-02-27 DIAGNOSIS — R011 Cardiac murmur, unspecified: Secondary | ICD-10-CM | POA: Diagnosis not present

## 2020-02-27 LAB — CBC WITH DIFFERENTIAL/PLATELET
Abs Immature Granulocytes: 0 10*3/uL (ref 0.00–0.07)
Basophils Absolute: 0 10*3/uL (ref 0.0–0.1)
Basophils Relative: 1 %
Eosinophils Absolute: 0.1 10*3/uL (ref 0.0–0.5)
Eosinophils Relative: 3 %
HCT: 34.4 % — ABNORMAL LOW (ref 39.0–52.0)
Hemoglobin: 11.2 g/dL — ABNORMAL LOW (ref 13.0–17.0)
Immature Granulocytes: 0 %
Lymphocytes Relative: 19 %
Lymphs Abs: 0.6 10*3/uL — ABNORMAL LOW (ref 0.7–4.0)
MCH: 32.7 pg (ref 26.0–34.0)
MCHC: 32.6 g/dL (ref 30.0–36.0)
MCV: 100.6 fL — ABNORMAL HIGH (ref 80.0–100.0)
Monocytes Absolute: 0.4 10*3/uL (ref 0.1–1.0)
Monocytes Relative: 13 %
Neutro Abs: 2 10*3/uL (ref 1.7–7.7)
Neutrophils Relative %: 64 %
Platelets: 163 10*3/uL (ref 150–400)
RBC: 3.42 MIL/uL — ABNORMAL LOW (ref 4.22–5.81)
RDW: 15 % (ref 11.5–15.5)
WBC: 3.1 10*3/uL — ABNORMAL LOW (ref 4.0–10.5)
nRBC: 0 % (ref 0.0–0.2)

## 2020-03-01 ENCOUNTER — Telehealth: Payer: Self-pay | Admitting: *Deleted

## 2020-03-01 DIAGNOSIS — D500 Iron deficiency anemia secondary to blood loss (chronic): Secondary | ICD-10-CM

## 2020-03-01 NOTE — Telephone Encounter (Signed)
Incoming call from patient. He was informed by GI to have his labs drawn weekly on Mondays to check if he needs a blood transfusion.   Colette - please arrange for lab/possible blood transfusion in 1 week and again in 2 weeks on a Monday if possible.

## 2020-03-01 NOTE — Telephone Encounter (Signed)
Spoke with Dr. Rogue Bussing. He would like to meet with the patient on 10/18 when patient come for lab/possible blood. Colette, please add patient to md schedule. Thanks.

## 2020-03-04 ENCOUNTER — Other Ambulatory Visit: Payer: Self-pay

## 2020-03-04 ENCOUNTER — Inpatient Hospital Stay: Payer: PPO

## 2020-03-04 DIAGNOSIS — D509 Iron deficiency anemia, unspecified: Secondary | ICD-10-CM | POA: Diagnosis not present

## 2020-03-04 DIAGNOSIS — D500 Iron deficiency anemia secondary to blood loss (chronic): Secondary | ICD-10-CM

## 2020-03-04 LAB — SAMPLE TO BLOOD BANK

## 2020-03-04 LAB — CBC WITH DIFFERENTIAL/PLATELET
Abs Immature Granulocytes: 0 10*3/uL (ref 0.00–0.07)
Basophils Absolute: 0 10*3/uL (ref 0.0–0.1)
Basophils Relative: 0 %
Eosinophils Absolute: 0.1 10*3/uL (ref 0.0–0.5)
Eosinophils Relative: 4 %
HCT: 36.7 % — ABNORMAL LOW (ref 39.0–52.0)
Hemoglobin: 12.3 g/dL — ABNORMAL LOW (ref 13.0–17.0)
Immature Granulocytes: 0 %
Lymphocytes Relative: 28 %
Lymphs Abs: 0.8 10*3/uL (ref 0.7–4.0)
MCH: 32.8 pg (ref 26.0–34.0)
MCHC: 33.5 g/dL (ref 30.0–36.0)
MCV: 97.9 fL (ref 80.0–100.0)
Monocytes Absolute: 0.4 10*3/uL (ref 0.1–1.0)
Monocytes Relative: 13 %
Neutro Abs: 1.5 10*3/uL — ABNORMAL LOW (ref 1.7–7.7)
Neutrophils Relative %: 55 %
Platelets: 166 10*3/uL (ref 150–400)
RBC: 3.75 MIL/uL — ABNORMAL LOW (ref 4.22–5.81)
RDW: 14.6 % (ref 11.5–15.5)
WBC: 2.8 10*3/uL — ABNORMAL LOW (ref 4.0–10.5)
nRBC: 0 % (ref 0.0–0.2)

## 2020-03-07 NOTE — Progress Notes (Signed)
Cardiology Office Note   Date:  03/08/2020   ID:  Seth Hardy, DOB [birth year], MRN [id number]  PCP:  Rusty Aus, MD  Cardiologist:   Kathlyn Sacramento, MD   Chief Complaint  Patient presents with  . office visit    1 month F/U. Patient unable to review medications-"wife takes care of my medicine".      History of Present Illness: Seth Hardy is a 74 y.o. male who Is here today for a follow-up visit regarding multiple cardiac issues. He has a history of coronary artery disease and is status CABG in September 2011 with subsequent bare metal stenting of the right coronary artery in 2013. He also has a history of persistent atrial fibrillation maintained in sinus rhythm on small dose amiodarone and is chronically anticoagulated on Coumadin. He has other chronic medical conditions that include aortic stenosis, sleep apnea on CPAP hypertension, hyperlipidemia, bilateral carotid arterial disease status post left carotid endarterectomy in April 2017, and diabetes.  He has known history of iron deficiency anemia due to gastric polyp which was resected.  He was seen in November of 2019 for worsening angina.  I repeated his echocardiogram which showed normal LV systolic function.  Aortic stenosis was found to be moderate with a mean gradient of 27 mmHg. A right and left cardiac catheterization was performed in December 20 19 which showed significant underlying three-vessel coronary artery disease with patent LIMA to LAD and SVG to OM with known chronically occluded SVG to RCA.  Proximal RCA stent was patent with only mild in-stent restenosis.  However, he was found to have severe calcified stenosis of the distal RCA.  Right heart catheterization showed mildly elevated filling pressures with normal cardiac output.  Aortic stenosis was moderate with a mean gradient of 20 mmHg. The patient underwent staged orbital atherectomy of the distal right coronary artery with drug-eluting stent  placement.   Echocardiogram in March of this year showed normal LV systolic function with stable moderate aortic stenosis with mean gradient of 28 mmHg with a valve area of 1.35.  The patient was hospitalized at [facility] in [place] in April of this year after he presented with A. fib with RVR and non-STEMI.    He underwent left heart catheterization which showed significant restenosis in the distal RCA stent.  This was treated with PCI and adding another drug-eluting stent.   He was hospitalized at [facility] in August with A. fib with RVR.  He converted sinus rhythm with IV amiodarone.  He was previously on amiodarone 100 mg once daily and this was increased to 200 mg twice daily for 1 week then down to 200 mg once daily.  His troponin was mildly elevated felt to be due to supply demand ischemia.  Echocardiogram was repeated which showed an EF of 60 to 47%, grade 2 diastolic dysfunction and stable moderate aortic stenosis.  The patient is noted to have iron deficiency anemia currently managed by hematology.  He was readmitted to the hospital a second time in August for symptomatic anemia. Treatment of atrial fibrillation was difficult due to underlying bradycardia and ultimately underwent permanent pacemaker placement by Dr. Quentin Ore in September.  His hemoglobin improved to 12 and has been doing great.  He denies chest pain, shortness of breath or palpitations.  His energy level improved with the pacemaker.  No recurrent palpitations.  Past Medical History:  Diagnosis Date  . Basal cell carcinoma of skin 2012  removed several spots from arms and back  . CAD (coronary artery disease)    a. 01/2010 CABG x 4: LIMA->LAD, VG->D1, VG->OM1, VG->PDA. b. NSTEMI 02/2012 in setting of AF-RVR with cath s/p BMS to RCA (plan for 1 month, possibly up to 3 months of Plavix)  . Carotid disease, bilateral ([place])    a. 03/2011 U/S: 40-59% bilat Carotid dzs;  b. 04/2015 Carotid U/S: 40-59% bilat ICA  stenosis; c. 05/2015 CTA Neck: 93% RICA, 90% LICA, mod-marked R vertebral stenosis, mod L vertebral stenosis.  . Chronic kidney disease    small obtusion per pcp. ultrasound done on 08/29/2015  . Diabetes mellitus type II, controlled ([place])    a. Variable CBG 02/2012 - several meds adjusted.  Marland Kitchen Dysrhythmia    intermittent Atrial Fibrillation  . GERD (gastroesophageal reflux disease)   . Heart murmur   . Hyperlipidemia   . Hypertension   . Hypertensive heart disease   . Iron deficiency anemia    "gets infusions q once in awhile" (04/27/2018)  . Iron deficiency anemia 08/2015   received 2 units rbc one week ago, 3 IV iron infusion -last 1 week.  . Macular edema bil   lazer work done previously  . Mild aortic stenosis    a. 03/2011 Echo: EF 55-60%, Mild AS. b. Mild by cath 02/2012; c. 01/2014 Echo: EF 65-70%, Gr 1 DD, mild AS, mildly dil LA.  . Multiple gastric ulcers 2006  . Myocardial infarction ([place]) 02/2012   stents (x1) at time  . Myocardial infarction [facility])    "2nd one was after 02/2012; don't know date" (04/27/2018)  . Obesity   . On home oxygen therapy    "2.5 w/CPAP" (04/27/2018)  . OSA on CPAP    CPAP settings 3 with oxygen 2.5 (04/27/2018)  . PAF (paroxysmal atrial fibrillation) ([place])    a. Newly dx 02/2012 & initiated on Coumadin (spont converted to NSR).  . Shortness of breath dyspnea   . Transfusion history    transfusions -1 month ago -2 units    Past Surgical History:  Procedure Laterality Date  . ANGIOPLASTY    . BASAL CELL CARCINOMA EXCISION     "shoulder, arm X 2"  . CARDIAC CATHETERIZATION  2014  . CARDIAC CATHETERIZATION  2021   [facility] x1 stent  . CATARACT EXTRACTION W/ INTRAOCULAR LENS  IMPLANT, BILATERAL Bilateral 2016  . COLONOSCOPY WITH PROPOFOL N/A 10/15/2015   Procedure: COLONOSCOPY WITH PROPOFOL;  Surgeon: Gatha Mayer, MD;  Location: [facility] ENDOSCOPY;  Service: Endoscopy;  Laterality: N/A;  . CORONARY ANGIOPLASTY     Dr. Fletcher Anon  .  CORONARY ARTERY BYPASS GRAFT  2011   CABG X3-4; [place], [place], [place],   . CORONARY ATHERECTOMY  04/27/2018  . CORONARY ATHERECTOMY N/A 04/27/2018   Procedure: CORONARY ATHERECTOMY;  Surgeon: Belva Crome, MD;  Location: [place] CV LAB;  Service: Cardiovascular;  Laterality: N/A;  . cyst L kidney  [place]  . ENDARTERECTOMY Left 08/30/2015   Procedure: ENDARTERECTOMY CAROTID;  Surgeon: Katha Cabal, MD;  Location: [facility] ORS;  Service: Vascular;  Laterality: Left;  . ESOPHAGOGASTRODUODENOSCOPY (EGD) WITH PROPOFOL N/A 10/15/2015   Procedure: ESOPHAGOGASTRODUODENOSCOPY (EGD) WITH PROPOFOL;  Surgeon: Gatha Mayer, MD;  Location: [facility] ENDOSCOPY;  Service: Endoscopy;  Laterality: N/A;  . ESOPHAGOGASTRODUODENOSCOPY (EGD) WITH PROPOFOL N/A 01/12/2020   Procedure: ESOPHAGOGASTRODUODENOSCOPY (EGD) WITH PROPOFOL;  Surgeon: Jerene Bears, MD;  Location: [facility] ENDOSCOPY;  Service: Gastroenterology;  Laterality: N/A;  . ESOPHAGOGASTRODUODENOSCOPY (EGD) WITH PROPOFOL N/A 01/15/2020   Procedure: ESOPHAGOGASTRODUODENOSCOPY (EGD) WITH PROPOFOL;  Surgeon: Ladene Artist, MD;  Location: [facility] ENDOSCOPY;  Service: Endoscopy;  Laterality: N/A;  . EYE SURGERY Bilateral    "has macular edema cauterized" (04/27/2018)  . HEMOSTASIS CLIP PLACEMENT  01/15/2020   Procedure: HEMOSTASIS CLIP PLACEMENT;  Surgeon: Ladene Artist, MD;  Location: [place];  Service: Endoscopy;;  . HOT HEMOSTASIS N/A 01/12/2020   Procedure: HOT HEMOSTASIS (ARGON PLASMA COAGULATION/BICAP);  Surgeon: Jerene Bears, MD;  Location: [facility] ENDOSCOPY;  Service: Gastroenterology;  Laterality: N/A;  . HOT HEMOSTASIS N/A 01/15/2020   Procedure: HOT HEMOSTASIS (ARGON PLASMA COAGULATION/BICAP);  Surgeon: Ladene Artist, MD;  Location: [facility] ENDOSCOPY;  Service: Endoscopy;  Laterality: N/A;  . INGUINAL HERNIA REPAIR Right   . LEFT HEART CATHETERIZATION WITH CORONARY ANGIOGRAM N/A 03/09/2012   Procedure: LEFT HEART CATHETERIZATION WITH CORONARY  ANGIOGRAM;  Surgeon: Wellington Hampshire, MD;  Location: [place] CATH LAB;  Service: Cardiovascular;  Laterality: N/A;  . PACEMAKER IMPLANT N/A 01/26/2020   Procedure: PACEMAKER IMPLANT;  Surgeon: Vickie Epley, MD;  Location: [place] CV LAB;  Service: Cardiovascular;  Laterality: N/A;  . PERCUTANEOUS CORONARY STENT INTERVENTION (PCI-S)  03/09/2012   Procedure: PERCUTANEOUS CORONARY STENT INTERVENTION (PCI-S);  Surgeon: Wellington Hampshire, MD;  Location: [facility] CATH LAB;  Service: Cardiovascular;;  . POLYPECTOMY  01/15/2020   Procedure: POLYPECTOMY;  Surgeon: Ladene Artist, MD;  Location: [facility] ENDOSCOPY;  Service: Endoscopy;;  . RIGHT/LEFT HEART CATH AND CORONARY ANGIOGRAPHY N/A 04/25/2018   Procedure: RIGHT/LEFT HEART CATH AND CORONARY ANGIOGRAPHY;  Surgeon: Wellington Hampshire, MD;  Location: [place] CV LAB;  Service: Cardiovascular;  Laterality: N/A;     Current Outpatient Medications  Medication Sig Dispense Refill  . ALPRAZolam (XANAX) 0.25 MG tablet Take 0.25 mg by mouth daily as needed for anxiety.    Marland Kitchen amiodarone (PACERONE) 200 MG tablet Take 200 mg by mouth daily.    Marland Kitchen amLODipine (NORVASC) 5 MG tablet Take one tablet by mouth one time daily 90 tablet 3  . aspirin EC 81 MG EC tablet Take 1 tablet (81 mg total) by mouth daily. Swallow whole. 30 tablet 11  . Cyanocobalamin (B-12) 1000 MCG CAPS Take 1,000 mcg by mouth every evening.     . Ferrous Gluconate-C-Folic Acid (IRON-C PO) Take 1 tablet by mouth daily.    . furosemide (LASIX) 20 MG tablet Take 20 mg by mouth daily as needed for edema.    Marland Kitchen glimepiride (AMARYL) 4 MG tablet Take 4 mg by mouth daily.     . insulin glargine (LANTUS) 100 UNIT/ML injection Inject 15 Units into the skin daily.     . isosorbide mononitrate (IMDUR) 30 MG 24 hr tablet Take 1 tablet (30 mg total) by mouth daily. 30 tablet 5  . metoprolol tartrate (LOPRESSOR) 25 MG tablet Take 1 tablet (25 mg) by mouth twice daily as needed for palpitations 30 tablet 3  .  NOVOLOG FLEXPEN 100 UNIT/ML FlexPen Inject 6-36 Units into the skin 2 (two) times daily with a meal.     . Omega-3 Fatty Acids (FISH OIL) 1000 MG CAPS Take 1,000 mg by mouth every evening.    . OXYGEN Inhale 2.5 L/min into the lungs at bedtime. With CPAP    . pantoprazole (PROTONIX) 40 MG tablet Take 1 tablet (40 mg total) by mouth daily. 30 tablet 0  . Polyethylene Glycol 400 (BLINK TEARS OP) Place 1 drop  into both eyes daily as needed (dry eyes).    . ramipril (ALTACE) 10 MG capsule Take 10 mg by mouth daily.     . rosuvastatin (CRESTOR) 40 MG tablet TAKE ONE TABLET BY MOUTH EVERY DAY 90 tablet 2  . sucralfate (CARAFATE) 1 g tablet Take 1 g by mouth 2 (two) times daily.    . tadalafil (CIALIS) 10 MG tablet Take 10 mg by mouth as needed.    . vitamin C (ASCORBIC ACID) 500 MG tablet Take 500 mg by mouth every evening.    . warfarin (COUMADIN) 5 MG tablet Take 2.5 mg by mouth daily.     Marland Kitchen amiodarone (PACERONE) 200 MG tablet Take 1 tablet (200 mg total) by mouth 2 (two) times daily for 6 days. Then take one tab po daily. (Patient taking differently: Take 200 mg by mouth daily. ) 45 tablet 0   No current facility-administered medications for this visit.    Allergies:   Patient has no known allergies.    Social History:  The patient  reports that he has never smoked. He has never used smokeless tobacco. He reports current alcohol use. He reports that he does not use drugs.   Family History:  [name] family history includes Bladder Cancer in his mother; COPD in his mother; Cancer in an other family member; Heart attack in his father; Kidney disease in his paternal uncle; Liver cancer in his maternal aunt; Lung cancer in his maternal aunt.    ROS:  Please see the history of present illness.   Otherwise, review of systems are positive for none.   All other systems are reviewed and negative.    PHYSICAL EXAM: VS:  BP 140/70 (BP Location: Left Arm, Patient Position: Sitting, Cuff Size:  Normal)   Pulse 60   Ht 5\' 8"  (1.727 m)   Wt 181 lb (82.1 kg)   SpO2 98%   BMI 27.52 kg/m  , BMI Body mass index is 27.52 kg/m. GEN: Well nourished, well developed, in no acute distress  HEENT: normal  Neck: no JVD, carotid bruits, or masses Cardiac: RRR; no rubs, or gallops, mild bilateral leg edema . There is a 3/6 crescendo decrescendo systolic murmur in the aortic area which is mid peaking.  S2 is diminished.  The murmur radiates to the carotid arteries. Respiratory:  clear to auscultation bilaterally, normal work of breathing GI: soft, nontender, nondistended, + BS MS: no deformity or atrophy  Skin: warm and dry, no rash Neuro:  Strength and sensation are intact Psych: euthymic mood, full affect    EKG:  EKG  ordered today. EKG showed atrial paced rhythm with nonspecific ST and T wave changes.   Recent Labs: 12/26/2019: TSH 3.237 01/11/2020: ALT 27 01/13/2020: Magnesium 2.2 01/30/2020: BUN 24; Creatinine, Ser 1.29; Potassium 4.8; Sodium 137 03/04/2020: Hemoglobin 12.3; Platelets 166    Lipid Panel    Component Value Date/Time   CHOL 105 12/26/2019 0813   TRIG 123 12/26/2019 0813   HDL 36 (L) 12/26/2019 0813   CHOLHDL 2.9 12/26/2019 0813   VLDL 25 12/26/2019 0813   LDLCALC 44 12/26/2019 0813      Wt Readings from Last 3 Encounters:  03/08/20 181 lb (82.1 kg)  02/21/20 181 lb (82.1 kg)  02/09/20 181 lb (82.1 kg)        ASSESSMENT AND PLAN:   1.  Coronary artery disease involving bypass graft with other forms of angina: He had anginal symptoms in the setting of A.  fib with RVR and anemia.  However, no further episodes since these were corrected.  He is no longer on clopidogrel given GI bleed and the fact that he is on warfarin.  He continues to be on low-dose aspirin.    2. Bilateral carotid artery stenosis: Status post  left carotid endarterectomy. Followed by Dr. Delana Meyer.  3. moderate aortic stenosis: This was stable on most recent echocardiogram earlier  this month.  Repeat echocardiogram in September 2022  4. Persistent atrial fibrillation: He is maintaining in sinus rhythm with amiodarone 200 mg once daily.  He is on anticoagulation with warfarin.  5. Hyperlipidemia: Continue high-dose rosuvastatin.  His LDL has been below 70.  6. Anemia: Uncontrolled with most recent hemoglobin of 12.    Disposition:   Follow-up in 4 months   Signed,  Kathlyn Sacramento, MD  03/08/2020 8:46 AM    [place]

## 2020-03-08 ENCOUNTER — Ambulatory Visit (INDEPENDENT_AMBULATORY_CARE_PROVIDER_SITE_OTHER): Payer: PPO | Admitting: Cardiovascular Disease

## 2020-03-08 ENCOUNTER — Other Ambulatory Visit: Payer: Self-pay

## 2020-03-08 ENCOUNTER — Encounter: Payer: Self-pay | Admitting: Cardiovascular Disease

## 2020-03-08 VITALS — BP 140/70 | HR 60 | Ht 68.0 in | Wt 181.0 lb

## 2020-03-08 DIAGNOSIS — I359 Nonrheumatic aortic valve disorder, unspecified: Secondary | ICD-10-CM | POA: Diagnosis not present

## 2020-03-08 DIAGNOSIS — E785 Hyperlipidemia, unspecified: Secondary | ICD-10-CM | POA: Diagnosis not present

## 2020-03-08 DIAGNOSIS — I25118 Atherosclerotic heart disease of native coronary artery with other forms of angina pectoris: Secondary | ICD-10-CM | POA: Diagnosis not present

## 2020-03-08 DIAGNOSIS — I4819 Other persistent atrial fibrillation: Secondary | ICD-10-CM

## 2020-03-08 NOTE — Patient Instructions (Signed)
Medication Instructions: Your physician recommends that you continue on your current medications as directed. Please refer to the Current Medication list given to you today.  *If you need a refill on your cardiac medications before your next appointment, please call your pharmacy*   Lab Work: None   If you have labs (blood work) drawn today and your tests are completely normal, you will receive your results only by: Marland Kitchen MyChart Message (if you have MyChart) OR . A paper copy in the mail If you have any lab test that is abnormal or we need to change your treatment, we will call you to review the results.   Testing/Procedures: NONE    Follow-Up: At [name], you and your health needs are our priority.  As part of our continuing mission to provide you with exceptional heart care, we have created designated Provider Care Teams.  These Care Teams include your primary Cardiologist (physician) and Advanced Practice Providers (APPs -  Physician Assistants and Nurse Practitioners) who all work together to provide you with the care you need, when you need it.  We recommend signing up for the patient portal called "MyChart".  Sign up information is provided on this After Visit Summary.  MyChart is used to connect with patients for Virtual Visits (Telemedicine).  Patients are able to view lab/test results, encounter notes, upcoming appointments, etc.  Non-urgent messages can be sent to your provider as well.   To learn more about what you can do with MyChart, go to [email or web address].    Your next appointment:   4 month(s)  The format for your next appointment:   In Person  Provider:   You may see Kathlyn Sacramento, MD or one of the following Advanced Practice Providers on your designated Care Team:    Murray Hodgkins, NP  Christell Faith, PA-C  Marrianne Mood, PA-C  Cadence Kathlen Mody, [place]    Other Instruction

## 2020-03-11 ENCOUNTER — Inpatient Hospital Stay (HOSPITAL_BASED_OUTPATIENT_CLINIC_OR_DEPARTMENT_OTHER): Payer: PPO | Admitting: Internal Medicine

## 2020-03-11 ENCOUNTER — Inpatient Hospital Stay: Payer: PPO

## 2020-03-11 ENCOUNTER — Encounter: Payer: Self-pay | Admitting: Internal Medicine

## 2020-03-11 ENCOUNTER — Telehealth: Payer: Self-pay | Admitting: Physician Assistant

## 2020-03-11 ENCOUNTER — Other Ambulatory Visit: Payer: Self-pay

## 2020-03-11 VITALS — BP 128/60 | HR 60 | Resp 16

## 2020-03-11 DIAGNOSIS — D500 Iron deficiency anemia secondary to blood loss (chronic): Secondary | ICD-10-CM

## 2020-03-11 DIAGNOSIS — D509 Iron deficiency anemia, unspecified: Secondary | ICD-10-CM | POA: Diagnosis not present

## 2020-03-11 LAB — CBC WITH DIFFERENTIAL/PLATELET
Abs Immature Granulocytes: 0 10*3/uL (ref 0.00–0.07)
Basophils Absolute: 0 10*3/uL (ref 0.0–0.1)
Basophils Relative: 1 %
Eosinophils Absolute: 0.1 10*3/uL (ref 0.0–0.5)
Eosinophils Relative: 4 %
HCT: 36 % — ABNORMAL LOW (ref 39.0–52.0)
Hemoglobin: 11.9 g/dL — ABNORMAL LOW (ref 13.0–17.0)
Immature Granulocytes: 0 %
Lymphocytes Relative: 21 %
Lymphs Abs: 0.7 10*3/uL (ref 0.7–4.0)
MCH: 32.9 pg (ref 26.0–34.0)
MCHC: 33.1 g/dL (ref 30.0–36.0)
MCV: 99.4 fL (ref 80.0–100.0)
Monocytes Absolute: 0.4 10*3/uL (ref 0.1–1.0)
Monocytes Relative: 12 %
Neutro Abs: 1.9 10*3/uL (ref 1.7–7.7)
Neutrophils Relative %: 62 %
Platelets: 166 10*3/uL (ref 150–400)
RBC: 3.62 MIL/uL — ABNORMAL LOW (ref 4.22–5.81)
RDW: 14.1 % (ref 11.5–15.5)
WBC: 3.1 10*3/uL — ABNORMAL LOW (ref 4.0–10.5)
nRBC: 0 % (ref 0.0–0.2)

## 2020-03-11 MED ORDER — SODIUM CHLORIDE 0.9 % IV SOLN
510.0000 mg | Freq: Once | INTRAVENOUS | Status: AC
  Administered 2020-03-11: 510 mg via INTRAVENOUS
  Filled 2020-03-11: qty 17

## 2020-03-11 MED ORDER — SODIUM CHLORIDE 0.9 % IV SOLN
Freq: Once | INTRAVENOUS | Status: AC
  Filled 2020-03-11: qty 250

## 2020-03-11 NOTE — Telephone Encounter (Signed)
Patient calling requesting to speak with a nurse in reference to his blood count

## 2020-03-11 NOTE — Telephone Encounter (Signed)
Patient called a second time and asked for return phone call.

## 2020-03-11 NOTE — Assessment & Plan Note (Addendum)
#  Macrocytic anemia-likely multifactorial anemia [iron deficient-likely secondary to gastric losses [gasric ulcer s/p APC] versus others-question cirrhosis.  Less likely bone marrow dysfunction.   # Today hemoglobin 11.9. Proceed with IV Feraheme infusion today.  Patient reluctantly agrees to iron infusion today.  Also discussed with patient's wife over the phone.  #Intermittent leucopenia-today4.2/ mild thrombocytopenia- 140-s150s- 166- likley ITP. STABLE; Monitor for now.  #CAD [aspirin 81mg /day; A. Fib [on coumadin]  # DISPOSITION:  # IV ferrahem today; # weekly cbc x 6; Possible Ferrahem in 3 weeks # follow up in 6 weeks- MD; labs-cbc/bmp; iron studies/ferritin- possible ferrahem -Dr.B

## 2020-03-11 NOTE — Progress Notes (Signed)
[facility] OFFICE PROGRESS NOTE  Patient Care Team: Rusty Aus, MD as PCP - General (Internal Medicine)  SUMMARY OF ONCOLOGIC HISTORY:  # MARCH 2017- April 2017- IRON DEFICIENCY ANEMIA ? Etiology s/p IV iron [EGD- ?June 2017- Bleeding gastric "polyp"/ colo- Dr.Gessner]; recommend CAPSULE STUDY [on HOLD sec to ?plavix]; September 2021 hemoglobin 6.5 [inpatient-EGD gastric bleeding ulcer s/p APC]; s/p 2 units PRBC;   # hx of A.fib- on coumadin-HOLD April 2017/ CAD [Dr.[name]]; Hx of gastric ulcer [Dr.Gessner];   TIA/ CEA [s/p April 2017]; OSA on CPAP; September 2021 status post permanent pacemaker  History of present illness:   [name] patient is here for follow-up of his severe iron deficiency anemia-;also on Coumadin for A. fib is here for follow-up.  In the interim patient was evaluated by GI-recommended weekly blood counts.   No blood in stools or black colored stools.   Review of Systems  Constitutional: Positive for malaise/fatigue. Negative for chills, diaphoresis and fever.  HENT: Negative for nosebleeds and sore throat.   Eyes: Negative for double vision.  Respiratory: Negative for cough, hemoptysis, sputum production and wheezing.   Cardiovascular: Negative for chest pain, palpitations, orthopnea and leg swelling.  Gastrointestinal: Positive for diarrhea. Negative for abdominal pain, blood in stool, constipation, heartburn, melena, nausea and vomiting.  Genitourinary: Negative for dysuria, frequency and urgency.  Musculoskeletal: Positive for back pain and joint pain.  Skin: Negative.  Negative for itching and rash.  Neurological: Negative for dizziness, tingling, focal weakness, weakness and headaches.  Endo/Heme/Allergies: Does not bruise/bleed easily.  Psychiatric/Behavioral: Negative for depression. The patient is not nervous/anxious and does not have insomnia.     Past Medical History:  Diagnosis Date  . Basal cell carcinoma of  skin 2012   removed several spots from arms and back  . CAD (coronary artery disease)    a. 01/2010 CABG x 4: LIMA->LAD, VG->D1, VG->OM1, VG->PDA. b. NSTEMI 02/2012 in setting of AF-RVR with cath s/p BMS to RCA (plan for 1 month, possibly up to 3 months of Plavix)  . Carotid disease, bilateral ([place])    a. 03/2011 U/S: 40-59% bilat Carotid dzs;  b. 04/2015 Carotid U/S: 40-59% bilat ICA stenosis; c. 05/2015 CTA Neck: 06% RICA, 26% LICA, mod-marked R vertebral stenosis, mod L vertebral stenosis.  . Chronic kidney disease    small obtusion per pcp. ultrasound done on 08/29/2015  . Diabetes mellitus type II, controlled ([place])    a. Variable CBG 02/2012 - several meds adjusted.  Marland Kitchen Dysrhythmia    intermittent Atrial Fibrillation  . GERD (gastroesophageal reflux disease)   . Heart murmur   . Hyperlipidemia   . Hypertension   . Hypertensive heart disease   . Iron deficiency anemia    "gets infusions q once in awhile" (04/27/2018)  . Iron deficiency anemia 08/2015   received 2 units rbc one week ago, 3 IV iron infusion -last 1 week.  . Macular edema bil   lazer work done previously  . Mild aortic stenosis    a. 03/2011 Echo: EF 55-60%, Mild AS. b. Mild by cath 02/2012; c. 01/2014 Echo: EF 65-70%, Gr 1 DD, mild AS, mildly dil LA.  . Multiple gastric ulcers 2006  . Myocardial infarction ([place]) 02/2012   stents (x1) at time  . Myocardial infarction [facility])    "2nd one was after 02/2012; don't know date" (04/27/2018)  . Obesity   . On home oxygen therapy    "2.5 w/CPAP" (04/27/2018)  .  OSA on CPAP    CPAP settings 3 with oxygen 2.5 (04/27/2018)  . PAF (paroxysmal atrial fibrillation) ([place])    a. Newly dx 02/2012 & initiated on Coumadin (spont converted to NSR).  . Shortness of breath dyspnea   . Transfusion history    transfusions -1 month ago -2 units    Past Surgical History:  Procedure Laterality Date  . ANGIOPLASTY    . BASAL CELL CARCINOMA EXCISION     "shoulder, arm X 2"  . CARDIAC  CATHETERIZATION  2014  . CARDIAC CATHETERIZATION  2021   [facility] x1 stent  . CATARACT EXTRACTION W/ INTRAOCULAR LENS  IMPLANT, BILATERAL Bilateral 2016  . COLONOSCOPY WITH PROPOFOL N/A 10/15/2015   Procedure: COLONOSCOPY WITH PROPOFOL;  Surgeon: Gatha Mayer, MD;  Location: [facility] ENDOSCOPY;  Service: Endoscopy;  Laterality: N/A;  . CORONARY ANGIOPLASTY     Dr. Fletcher Anon  . CORONARY ARTERY BYPASS GRAFT  2011   CABG X3-4; [place], [place], [place],   . CORONARY ATHERECTOMY  04/27/2018  . CORONARY ATHERECTOMY N/A 04/27/2018   Procedure: CORONARY ATHERECTOMY;  Surgeon: Belva Crome, MD;  Location: [facility] CV LAB;  Service: Cardiovascular;  Laterality: N/A;  . cyst L kidney  [place]  . ENDARTERECTOMY Left 08/30/2015   Procedure: ENDARTERECTOMY CAROTID;  Surgeon: Katha Cabal, MD;  Location: [facility] ORS;  Service: Vascular;  Laterality: Left;  . ESOPHAGOGASTRODUODENOSCOPY (EGD) WITH PROPOFOL N/A 10/15/2015   Procedure: ESOPHAGOGASTRODUODENOSCOPY (EGD) WITH PROPOFOL;  Surgeon: Gatha Mayer, MD;  Location: [facility] ENDOSCOPY;  Service: Endoscopy;  Laterality: N/A;  . ESOPHAGOGASTRODUODENOSCOPY (EGD) WITH PROPOFOL N/A 01/12/2020   Procedure: ESOPHAGOGASTRODUODENOSCOPY (EGD) WITH PROPOFOL;  Surgeon: Jerene Bears, MD;  Location: [facility] ENDOSCOPY;  Service: Gastroenterology;  Laterality: N/A;  . ESOPHAGOGASTRODUODENOSCOPY (EGD) WITH PROPOFOL N/A 01/15/2020   Procedure: ESOPHAGOGASTRODUODENOSCOPY (EGD) WITH PROPOFOL;  Surgeon: Ladene Artist, MD;  Location: [facility] ENDOSCOPY;  Service: Endoscopy;  Laterality: N/A;  . EYE SURGERY Bilateral    "has macular edema cauterized" (04/27/2018)  . HEMOSTASIS CLIP PLACEMENT  01/15/2020   Procedure: HEMOSTASIS CLIP PLACEMENT;  Surgeon: Ladene Artist, MD;  Location: [facility];  Service: Endoscopy;;  . HOT HEMOSTASIS N/A 01/12/2020   Procedure: HOT HEMOSTASIS (ARGON PLASMA COAGULATION/BICAP);  Surgeon: Jerene Bears, MD;  Location: [facility] ENDOSCOPY;   Service: Gastroenterology;  Laterality: N/A;  . HOT HEMOSTASIS N/A 01/15/2020   Procedure: HOT HEMOSTASIS (ARGON PLASMA COAGULATION/BICAP);  Surgeon: Ladene Artist, MD;  Location: [facility] ENDOSCOPY;  Service: Endoscopy;  Laterality: N/A;  . INGUINAL HERNIA REPAIR Right   . LEFT HEART CATHETERIZATION WITH CORONARY ANGIOGRAM N/A 03/09/2012   Procedure: LEFT HEART CATHETERIZATION WITH CORONARY ANGIOGRAM;  Surgeon: Wellington Hampshire, MD;  Location: [facility] CATH LAB;  Service: Cardiovascular;  Laterality: N/A;  . PACEMAKER IMPLANT N/A 01/26/2020   Procedure: PACEMAKER IMPLANT;  Surgeon: Vickie Epley, MD;  Location: [facility] CV LAB;  Service: Cardiovascular;  Laterality: N/A;  . PERCUTANEOUS CORONARY STENT INTERVENTION (PCI-S)  03/09/2012   Procedure: PERCUTANEOUS CORONARY STENT INTERVENTION (PCI-S);  Surgeon: Wellington Hampshire, MD;  Location: [facility] CATH LAB;  Service: Cardiovascular;;  . POLYPECTOMY  01/15/2020   Procedure: POLYPECTOMY;  Surgeon: Ladene Artist, MD;  Location: [facility] ENDOSCOPY;  Service: Endoscopy;;  . RIGHT/LEFT HEART CATH AND CORONARY ANGIOGRAPHY N/A 04/25/2018   Procedure: RIGHT/LEFT HEART CATH AND CORONARY ANGIOGRAPHY;  Surgeon: Wellington Hampshire, MD;  Location: [facility] CV LAB;  Service: Cardiovascular;  Laterality: N/A;  Family History  Problem Relation Age of Onset  . COPD Mother        alive 78  . Bladder Cancer Mother   . Heart attack Father        09/12/13 deceased  . Lung cancer Maternal Aunt   . Liver cancer Maternal Aunt   . Kidney disease Paternal Uncle   . Cancer Other        all paternal aunts and uncles  . Prostate cancer Neg Hx   . Kidney cancer Neg Hx     SOCIAL HISTORY:   Social History  Substance Use Topics  . Smoking status: Never Smoker   . Smokeless tobacco: Never Used     Comment: tobacco use - no  . Alcohol Use: Yes     Comment: rare drink    ALLERGIES:  has No Known Allergies.  MEDICATIONS:  Current Outpatient Prescriptions  Medication Sig  Dispense Refill  . acetaminophen (TYLENOL) 325 MG tablet Take 650 mg by mouth every 6 (six) hours as needed. For pain    . amiodarone (PACERONE) 200 MG tablet Take 0.5 tablets (100 mg total) by mouth daily. (Patient taking differently: Take 100 mg by mouth at bedtime. ) 45 tablet 3  . amLODipine (NORVASC) 5 MG tablet Take one tablet by mouth one time daily 90 tablet 3  . aspirin 81 MG EC tablet Take 81 mg by mouth daily.      Marland Kitchen atorvastatin (LIPITOR) 20 MG tablet Take 1 tablet (20 mg total) by mouth at bedtime. 90 tablet 3  . furosemide (LASIX) 20 MG tablet Take 1 tablet (20 mg total) by mouth as needed. 30 tablet 6  . glimepiride (AMARYL) 4 MG tablet Take 4 mg by mouth 2 (two) times daily.      . insulin glargine (LANTUS) 100 UNIT/ML injection Inject 20 Units into the skin daily.     . insulin lispro (HUMALOG) 100 UNIT/ML injection Inject 8 Units into the skin 2 (two) times daily with a meal. (Patient taking differently: Inject into the skin 3 (three) times daily. Per sliding scale.)    . metFORMIN (GLUCOPHAGE) 500 MG tablet Take 500 mg by mouth 2 (two) times daily with a meal.    . nitroGLYCERIN (NITROSTAT) 0.4 MG SL tablet Place 1 tablet (0.4 mg total) under the tongue every 5 (five) minutes as needed (up to 3 doses). 25 tablet 1  . OXYGEN Inhale 2.5 L/min into the lungs at bedtime. With CPAP    . pantoprazole (PROTONIX) 40 MG tablet Take 1 tablet (40 mg total) by mouth daily. (Patient taking differently: Take 40 mg by mouth 2 (two) times daily. ) 90 tablet 3  . ramipril (ALTACE) 10 MG capsule Take 1 capsule (10 mg total) by mouth daily. 90 capsule 5  . warfarin (COUMADIN) 5 MG tablet Take as directed by anticoagulation clinic 90 tablet 1  . IRON-VITAMIN C PO Take 1 tablet by mouth daily. Reported on 08/27/2015    . Omega-3 Fatty Acids (FISH OIL) 1200 MG CAPS Take 1 capsule by mouth daily. Reported on 08/27/2015     No current facility-administered medications for this visit.    PHYSICAL  EXAMINATION:   BP 158/71 mmHg  Pulse 51  Temp(Src) 96.1 F (35.6 C)  Resp 18  Wt 192 lb 0.3 oz (87.1 kg)  Filed Weights   08/27/15 1015  Weight: 192 lb 0.3 oz (87.1 kg)    Physical Exam Constitutional:      Comments: Patient  alone.  HENT:     Head: Normocephalic and atraumatic.     Mouth/Throat:     Pharynx: No oropharyngeal exudate.  Eyes:     Pupils: Pupils are equal, round, and reactive to light.  Cardiovascular:     Rate and Rhythm: Normal rate and regular rhythm.  Pulmonary:     Effort: No respiratory distress.     Breath sounds: No wheezing.  Abdominal:     General: Bowel sounds are normal. There is no distension.     Palpations: Abdomen is soft. There is no mass.     Tenderness: There is no abdominal tenderness. There is no guarding or rebound.  Musculoskeletal:        General: No tenderness. Normal range of motion.     Cervical back: Normal range of motion and neck supple.  Skin:    General: Skin is warm.  Neurological:     Mental Status: He is alert and oriented to person, place, and time.  Psychiatric:        Mood and Affect: Affect normal.      LABORATORY DATA:  I have reviewed the data as listed    Component Value Date/Time   NA 139 07/25/2015 1138   NA 139 01/22/2013 0348   K 4.1 07/25/2015 1138   K 4.2 01/22/2013 0348   CL 103 07/25/2015 1138   CL 106 01/22/2013 0348   CO2 27 07/25/2015 1138   CO2 28 01/22/2013 0348   GLUCOSE 216* 07/25/2015 1138   GLUCOSE 158* 01/22/2013 0348   BUN 14 07/25/2015 1138   BUN 16 01/22/2013 0348   CREATININE 1.03 07/25/2015 1138   CREATININE 1.02 01/22/2013 0348   CALCIUM 9.1 07/25/2015 1138   CALCIUM 8.6 01/22/2013 0348   PROT 7.2 06/25/2015 1045   PROT 7.5 01/21/2013 0209   ALBUMIN 4.1 06/25/2015 1045   ALBUMIN 4.3 01/21/2013 0209   AST 30 06/25/2015 1045   AST 28 01/21/2013 0209   ALT 33 06/25/2015 1045   ALT 36 01/21/2013 0209   ALKPHOS 104 06/25/2015 1045   ALKPHOS 143* 01/21/2013 0209    BILITOT 0.4 06/25/2015 1045   BILITOT 0.4 01/21/2013 0209   GFRNONAA >60 07/25/2015 1138   GFRNONAA >60 01/22/2013 0348   GFRAA >60 07/25/2015 1138   GFRAA >60 01/22/2013 0348    No results found for: SPEP, UPEP  Lab Results  Component Value Date   WBC 4.5 08/22/2015   NEUTROABS 2.5 08/22/2015   HGB 8.0 Repeated and verified X2.* 08/22/2015   HCT 26.2 Repeated and verified X2.* 08/22/2015   MCV 67.4 Repeated and verified X2.* 08/22/2015   PLT 264.0 08/22/2015      Chemistry      Component Value Date/Time   NA 139 07/25/2015 1138   NA 139 01/22/2013 0348   K 4.1 07/25/2015 1138   K 4.2 01/22/2013 0348   CL 103 07/25/2015 1138   CL 106 01/22/2013 0348   CO2 27 07/25/2015 1138   CO2 28 01/22/2013 0348   BUN 14 07/25/2015 1138   BUN 16 01/22/2013 0348   CREATININE 1.03 07/25/2015 1138   CREATININE 1.02 01/22/2013 0348      Component Value Date/Time   CALCIUM 9.1 07/25/2015 1138   CALCIUM 8.6 01/22/2013 0348   ALKPHOS 104 06/25/2015 1045   ALKPHOS 143* 01/21/2013 0209   AST 30 06/25/2015 1045   AST 28 01/21/2013 0209   ALT 33 06/25/2015 1045   ALT 36 01/21/2013 0209  BILITOT 0.4 06/25/2015 1045   BILITOT 0.4 01/21/2013 0209        ASSESSMENT & PLAN:   Iron deficiency anemia due to chronic blood loss #Macrocytic anemia-likely multifactorial anemia [iron deficient-likely secondary to gastric losses [gasric ulcer s/p APC] versus others-question cirrhosis.  Less likely bone marrow dysfunction.   # Today hemoglobin 11.9. Proceed with IV Feraheme infusion today.  Patient reluctantly agrees to iron infusion today.  Also discussed with patient's wife over the phone.  #Intermittent leucopenia-today4.2/ mild thrombocytopenia- 140-s150s- 166- likley ITP. STABLE; Monitor for now.  #CAD [aspirin 81mg /day; A. Fib [on coumadin]  # DISPOSITION:  # IV ferrahem today; # weekly cbc x 6; Possible Ferrahem in 3 weeks # follow up in 6 weeks- MD; labs-cbc/bmp; iron  studies/ferritin- possible ferrahem -Dr.B

## 2020-03-12 ENCOUNTER — Other Ambulatory Visit: Payer: Self-pay | Admitting: Cardiovascular Disease

## 2020-03-12 NOTE — Telephone Encounter (Signed)
Spoke with the patient. His labs done through Hem/Onc are in Epic. Reports hgb 12.3 last week and 11.9 most recently.  He is scheduled for iron infusions x3 3 weeks apart each time.  He had the first infusion yesterday. He does not understand why he has to have the infusions. He does not know how long he needs to have the CBC done.

## 2020-03-13 ENCOUNTER — Other Ambulatory Visit: Payer: Self-pay

## 2020-03-13 ENCOUNTER — Ambulatory Visit (INDEPENDENT_AMBULATORY_CARE_PROVIDER_SITE_OTHER): Payer: PPO

## 2020-03-13 ENCOUNTER — Other Ambulatory Visit: Payer: Self-pay | Admitting: Cardiovascular Disease

## 2020-03-13 DIAGNOSIS — Z7901 Long term (current) use of anticoagulants: Secondary | ICD-10-CM | POA: Diagnosis not present

## 2020-03-13 DIAGNOSIS — I4891 Unspecified atrial fibrillation: Secondary | ICD-10-CM | POA: Diagnosis not present

## 2020-03-13 LAB — POCT INR: INR: 1.8 — AB (ref 2.0–3.0)

## 2020-03-13 NOTE — Telephone Encounter (Signed)
Refill request

## 2020-03-13 NOTE — Patient Instructions (Addendum)
-   take 1 whole tablet today and tomorrow, then  - continue warfarin dosage of 1/2 a tablet daily.  - Recheck INR in 5 weeks

## 2020-03-14 ENCOUNTER — Telehealth: Payer: Self-pay | Admitting: Physician Assistant

## 2020-03-14 NOTE — Telephone Encounter (Signed)
He is being followed by hematology in [place] - he has had 2 iron infusions - usually that replaced the iron well - we usually will do 2 iron infusions then plan to repeat Iron levels in 3 months   so mid to late december

## 2020-03-14 NOTE — Telephone Encounter (Signed)
And the iron infusions? He would like your thoughts. Thanks

## 2020-03-14 NOTE — Telephone Encounter (Signed)
Patient advised.

## 2020-03-14 NOTE — Telephone Encounter (Signed)
His las hgb 's have been stable - when he was last in office his wife was very concerned -and wanted to be sure we kept close watch of his Hgb . At this point we can go to q 3-4 week cbc

## 2020-03-18 ENCOUNTER — Inpatient Hospital Stay: Payer: PPO

## 2020-03-18 ENCOUNTER — Other Ambulatory Visit: Payer: Self-pay

## 2020-03-18 DIAGNOSIS — D500 Iron deficiency anemia secondary to blood loss (chronic): Secondary | ICD-10-CM

## 2020-03-18 DIAGNOSIS — D509 Iron deficiency anemia, unspecified: Secondary | ICD-10-CM | POA: Diagnosis not present

## 2020-03-18 LAB — CBC WITH DIFFERENTIAL/PLATELET
Abs Immature Granulocytes: 0 10*3/uL (ref 0.00–0.07)
Basophils Absolute: 0 10*3/uL (ref 0.0–0.1)
Basophils Relative: 1 %
Eosinophils Absolute: 0.1 10*3/uL (ref 0.0–0.5)
Eosinophils Relative: 3 %
HCT: 35.5 % — ABNORMAL LOW (ref 39.0–52.0)
Hemoglobin: 11.8 g/dL — ABNORMAL LOW (ref 13.0–17.0)
Immature Granulocytes: 0 %
Lymphocytes Relative: 19 %
Lymphs Abs: 0.7 10*3/uL (ref 0.7–4.0)
MCH: 33.3 pg (ref 26.0–34.0)
MCHC: 33.2 g/dL (ref 30.0–36.0)
MCV: 100.3 fL — ABNORMAL HIGH (ref 80.0–100.0)
Monocytes Absolute: 0.4 10*3/uL (ref 0.1–1.0)
Monocytes Relative: 12 %
Neutro Abs: 2.3 10*3/uL (ref 1.7–7.7)
Neutrophils Relative %: 65 %
Platelets: 163 10*3/uL (ref 150–400)
RBC: 3.54 MIL/uL — ABNORMAL LOW (ref 4.22–5.81)
RDW: 14 % (ref 11.5–15.5)
WBC: 3.4 10*3/uL — ABNORMAL LOW (ref 4.0–10.5)
nRBC: 0 % (ref 0.0–0.2)

## 2020-03-22 DIAGNOSIS — G4733 Obstructive sleep apnea (adult) (pediatric): Secondary | ICD-10-CM | POA: Diagnosis not present

## 2020-03-26 ENCOUNTER — Inpatient Hospital Stay: Payer: PPO | Attending: Internal Medicine

## 2020-03-26 DIAGNOSIS — D500 Iron deficiency anemia secondary to blood loss (chronic): Secondary | ICD-10-CM | POA: Insufficient documentation

## 2020-03-26 DIAGNOSIS — Z79899 Other long term (current) drug therapy: Secondary | ICD-10-CM | POA: Insufficient documentation

## 2020-03-28 DIAGNOSIS — K250 Acute gastric ulcer with hemorrhage: Secondary | ICD-10-CM | POA: Diagnosis not present

## 2020-03-28 DIAGNOSIS — G4733 Obstructive sleep apnea (adult) (pediatric): Secondary | ICD-10-CM | POA: Diagnosis not present

## 2020-03-28 DIAGNOSIS — G4734 Idiopathic sleep related nonobstructive alveolar hypoventilation: Secondary | ICD-10-CM | POA: Diagnosis not present

## 2020-03-28 DIAGNOSIS — Z9989 Dependence on other enabling machines and devices: Secondary | ICD-10-CM | POA: Diagnosis not present

## 2020-03-28 DIAGNOSIS — N529 Male erectile dysfunction, unspecified: Secondary | ICD-10-CM | POA: Diagnosis not present

## 2020-04-01 ENCOUNTER — Inpatient Hospital Stay: Payer: PPO

## 2020-04-01 VITALS — BP 115/50 | HR 59 | Temp 97.9°F | Resp 16

## 2020-04-01 DIAGNOSIS — D500 Iron deficiency anemia secondary to blood loss (chronic): Secondary | ICD-10-CM | POA: Diagnosis not present

## 2020-04-01 DIAGNOSIS — Z79899 Other long term (current) drug therapy: Secondary | ICD-10-CM | POA: Diagnosis not present

## 2020-04-01 LAB — CBC WITH DIFFERENTIAL/PLATELET
Abs Immature Granulocytes: 0.01 10*3/uL (ref 0.00–0.07)
Basophils Absolute: 0 10*3/uL (ref 0.0–0.1)
Basophils Relative: 1 %
Eosinophils Absolute: 0.1 10*3/uL (ref 0.0–0.5)
Eosinophils Relative: 3 %
HCT: 34.7 % — ABNORMAL LOW (ref 39.0–52.0)
Hemoglobin: 11.4 g/dL — ABNORMAL LOW (ref 13.0–17.0)
Immature Granulocytes: 0 %
Lymphocytes Relative: 20 %
Lymphs Abs: 0.7 10*3/uL (ref 0.7–4.0)
MCH: 33 pg (ref 26.0–34.0)
MCHC: 32.9 g/dL (ref 30.0–36.0)
MCV: 100.6 fL — ABNORMAL HIGH (ref 80.0–100.0)
Monocytes Absolute: 0.4 10*3/uL (ref 0.1–1.0)
Monocytes Relative: 11 %
Neutro Abs: 2.3 10*3/uL (ref 1.7–7.7)
Neutrophils Relative %: 65 %
Platelets: 174 10*3/uL (ref 150–400)
RBC: 3.45 MIL/uL — ABNORMAL LOW (ref 4.22–5.81)
RDW: 14.3 % (ref 11.5–15.5)
WBC: 3.5 10*3/uL — ABNORMAL LOW (ref 4.0–10.5)
nRBC: 0 % (ref 0.0–0.2)

## 2020-04-01 MED ORDER — SODIUM CHLORIDE 0.9 % IV SOLN
Freq: Once | INTRAVENOUS | Status: AC
  Filled 2020-04-01: qty 250

## 2020-04-01 MED ORDER — SODIUM CHLORIDE 0.9 % IV SOLN
510.0000 mg | Freq: Once | INTRAVENOUS | Status: AC
  Administered 2020-04-01: 510 mg via INTRAVENOUS
  Filled 2020-04-01: qty 510

## 2020-04-08 ENCOUNTER — Inpatient Hospital Stay: Payer: PPO

## 2020-04-13 DIAGNOSIS — G4733 Obstructive sleep apnea (adult) (pediatric): Secondary | ICD-10-CM | POA: Diagnosis not present

## 2020-04-15 ENCOUNTER — Inpatient Hospital Stay: Payer: PPO

## 2020-04-15 ENCOUNTER — Other Ambulatory Visit: Payer: Self-pay

## 2020-04-15 ENCOUNTER — Ambulatory Visit (INDEPENDENT_AMBULATORY_CARE_PROVIDER_SITE_OTHER): Payer: PPO

## 2020-04-15 DIAGNOSIS — D500 Iron deficiency anemia secondary to blood loss (chronic): Secondary | ICD-10-CM | POA: Diagnosis not present

## 2020-04-15 DIAGNOSIS — I4891 Unspecified atrial fibrillation: Secondary | ICD-10-CM

## 2020-04-15 DIAGNOSIS — Z7901 Long term (current) use of anticoagulants: Secondary | ICD-10-CM | POA: Diagnosis not present

## 2020-04-15 LAB — IRON AND TIBC
Iron: 104 ug/dL (ref 45–182)
Saturation Ratios: 34 % (ref 17.9–39.5)
TIBC: 308 ug/dL (ref 250–450)
UIBC: 204 ug/dL

## 2020-04-15 LAB — CBC WITH DIFFERENTIAL/PLATELET
Abs Immature Granulocytes: 0 10*3/uL (ref 0.00–0.07)
Basophils Absolute: 0 10*3/uL (ref 0.0–0.1)
Basophils Relative: 1 %
Eosinophils Absolute: 0.1 10*3/uL (ref 0.0–0.5)
Eosinophils Relative: 3 %
HCT: 39 % (ref 39.0–52.0)
Hemoglobin: 12.8 g/dL — ABNORMAL LOW (ref 13.0–17.0)
Immature Granulocytes: 0 %
Lymphocytes Relative: 17 %
Lymphs Abs: 0.5 10*3/uL — ABNORMAL LOW (ref 0.7–4.0)
MCH: 32.7 pg (ref 26.0–34.0)
MCHC: 32.8 g/dL (ref 30.0–36.0)
MCV: 99.7 fL (ref 80.0–100.0)
Monocytes Absolute: 0.3 10*3/uL (ref 0.1–1.0)
Monocytes Relative: 10 %
Neutro Abs: 2.1 10*3/uL (ref 1.7–7.7)
Neutrophils Relative %: 69 %
Platelets: 170 10*3/uL (ref 150–400)
RBC: 3.91 MIL/uL — ABNORMAL LOW (ref 4.22–5.81)
RDW: 14 % (ref 11.5–15.5)
WBC: 3.1 10*3/uL — ABNORMAL LOW (ref 4.0–10.5)
nRBC: 0 % (ref 0.0–0.2)

## 2020-04-15 LAB — BASIC METABOLIC PANEL
Anion gap: 12 (ref 5–15)
BUN: 23 mg/dL (ref 8–23)
CO2: 24 mmol/L (ref 22–32)
Calcium: 9.1 mg/dL (ref 8.9–10.3)
Chloride: 100 mmol/L (ref 98–111)
Creatinine, Ser: 1.49 mg/dL — ABNORMAL HIGH (ref 0.61–1.24)
GFR, Estimated: 49 mL/min — ABNORMAL LOW (ref >60)
Glucose, Bld: 258 mg/dL — ABNORMAL HIGH (ref 70–99)
Potassium: 4.8 mmol/L (ref 3.5–5.1)
Sodium: 136 mmol/L (ref 135–145)

## 2020-04-15 LAB — POCT INR: INR: 1.8 — AB (ref 2.0–3.0)

## 2020-04-15 LAB — FERRITIN: Ferritin: 250 ng/mL (ref 24–336)

## 2020-04-15 NOTE — Patient Instructions (Signed)
-   START NEW DOSAGE warfarin of 1/2 a tablet daily EXCEPT 1 WHOLE TABLET ON MONDAYS & FRIDAYS - Recheck INR in 3 weeks

## 2020-04-16 ENCOUNTER — Other Ambulatory Visit: Payer: Self-pay | Admitting: *Deleted

## 2020-04-16 DIAGNOSIS — E538 Deficiency of other specified B group vitamins: Secondary | ICD-10-CM | POA: Diagnosis not present

## 2020-04-16 DIAGNOSIS — Z125 Encounter for screening for malignant neoplasm of prostate: Secondary | ICD-10-CM | POA: Diagnosis not present

## 2020-04-16 DIAGNOSIS — D500 Iron deficiency anemia secondary to blood loss (chronic): Secondary | ICD-10-CM | POA: Diagnosis not present

## 2020-04-16 DIAGNOSIS — G4733 Obstructive sleep apnea (adult) (pediatric): Secondary | ICD-10-CM | POA: Diagnosis not present

## 2020-04-16 DIAGNOSIS — E1165 Type 2 diabetes mellitus with hyperglycemia: Secondary | ICD-10-CM | POA: Diagnosis not present

## 2020-04-16 DIAGNOSIS — E1139 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: Secondary | ICD-10-CM | POA: Diagnosis not present

## 2020-04-19 ENCOUNTER — Other Ambulatory Visit: Payer: Self-pay | Admitting: Cardiovascular Disease

## 2020-04-22 ENCOUNTER — Inpatient Hospital Stay (HOSPITAL_BASED_OUTPATIENT_CLINIC_OR_DEPARTMENT_OTHER): Payer: PPO | Admitting: Internal Medicine

## 2020-04-22 ENCOUNTER — Encounter: Payer: Self-pay | Admitting: Internal Medicine

## 2020-04-22 ENCOUNTER — Other Ambulatory Visit: Payer: Self-pay

## 2020-04-22 ENCOUNTER — Inpatient Hospital Stay: Payer: PPO

## 2020-04-22 DIAGNOSIS — D500 Iron deficiency anemia secondary to blood loss (chronic): Secondary | ICD-10-CM

## 2020-04-22 DIAGNOSIS — G4733 Obstructive sleep apnea (adult) (pediatric): Secondary | ICD-10-CM | POA: Diagnosis not present

## 2020-04-22 NOTE — Assessment & Plan Note (Addendum)
#  Macrocytic anemia-likely multifactorial anemia [iron deficient-likely secondary to gastric losses [gasric ulcer s/p APC] versus others-question cirrhosis.  Less likely bone marrow dysfunction.  # Today hemoglobin 12.8; hold iron infusion.  #Intermittent leucopenia-today4.2/ mild thrombocytopenia- 140-s150s- 166- likley ITP. STABLE; Monitor for now.  #CAD [aspirin 81mg /day; A. Fib [on coumadin]  # DISPOSITION:  # NO IV ferrahem today; # cancel 12/07 Ferrhem # 1 month-lab- cbc/ possible ferrahem # follow up in 2 months- MD; labs-cbc/bmp- possible ferrahem -Dr.B

## 2020-04-22 NOTE — Progress Notes (Signed)
[place] OFFICE PROGRESS NOTE  Patient Care Team: Rusty Aus, MD as PCP - General (Internal Medicine)  SUMMARY OF ONCOLOGIC HISTORY:  # MARCH 2017- April 2017- IRON DEFICIENCY ANEMIA ? Etiology s/p IV iron [EGD- ?June 2017- Bleeding gastric "polyp"/ colo- Dr.Gessner]; recommend CAPSULE STUDY [on HOLD sec to ?plavix]; September 2021 hemoglobin 6.5 [inpatient-EGD gastric bleeding ulcer s/p APC]; s/p 2 units PRBC;   # hx of A.fib- on coumadin-HOLD April 2017/ CAD [Dr.[name]]; Hx of gastric ulcer [Dr.Gessner];   TIA/ CEA [s/p April 2017]; OSA on CPAP; September 2021 status post permanent pacemaker  History of present illness:   [name] patient is here for follow-up of his severe iron deficiency anemia-;also on Coumadin for A. fib is here for follow-up.  Patient received iron transfusion approximately 4 weeks ago.  He also improvement of his energy levels.  No blood in stools or black colored stools.   Review of Systems  Constitutional: Positive for malaise/fatigue. Negative for chills, diaphoresis and fever.  HENT: Negative for nosebleeds and sore throat.   Eyes: Negative for double vision.  Respiratory: Negative for cough, hemoptysis, sputum production and wheezing.   Cardiovascular: Negative for chest pain, palpitations, orthopnea and leg swelling.  Gastrointestinal: Positive for diarrhea. Negative for abdominal pain, blood in stool, constipation, heartburn, melena, nausea and vomiting.  Genitourinary: Negative for dysuria, frequency and urgency.  Musculoskeletal: Positive for back pain and joint pain.  Skin: Negative.  Negative for itching and rash.  Neurological: Negative for dizziness, tingling, focal weakness, weakness and headaches.  Endo/Heme/Allergies: Does not bruise/bleed easily.  Psychiatric/Behavioral: Negative for depression. The patient is not nervous/anxious and does not have insomnia.     Past Medical History:  Diagnosis Date  .  Basal cell carcinoma of skin 2012   removed several spots from arms and back  . CAD (coronary artery disease)    a. 01/2010 CABG x 4: LIMA->LAD, VG->D1, VG->OM1, VG->PDA. b. NSTEMI 02/2012 in setting of AF-RVR with cath s/p BMS to RCA (plan for 1 month, possibly up to 3 months of Plavix)  . Carotid disease, bilateral ([place])    a. 03/2011 U/S: 40-59% bilat Carotid dzs;  b. 04/2015 Carotid U/S: 40-59% bilat ICA stenosis; c. 05/2015 CTA Neck: 46% RICA, 96% LICA, mod-marked R vertebral stenosis, mod L vertebral stenosis.  . Chronic kidney disease    small obtusion per pcp. ultrasound done on 08/29/2015  . Diabetes mellitus type II, controlled ([place])    a. Variable CBG 02/2012 - several meds adjusted.  Marland Kitchen Dysrhythmia    intermittent Atrial Fibrillation  . GERD (gastroesophageal reflux disease)   . Heart murmur   . Hyperlipidemia   . Hypertension   . Hypertensive heart disease   . Iron deficiency anemia    "gets infusions q once in awhile" (04/27/2018)  . Iron deficiency anemia 08/2015   received 2 units rbc one week ago, 3 IV iron infusion -last 1 week.  . Macular edema bil   lazer work done previously  . Mild aortic stenosis    a. 03/2011 Echo: EF 55-60%, Mild AS. b. Mild by cath 02/2012; c. 01/2014 Echo: EF 65-70%, Gr 1 DD, mild AS, mildly dil LA.  . Multiple gastric ulcers 2006  . Myocardial infarction ([place]) 02/2012   stents (x1) at time  . Myocardial infarction [facility])    "2nd one was after 02/2012; don't know date" (04/27/2018)  . Obesity   . On home oxygen therapy    "2.5  w/CPAP" (04/27/2018)  . OSA on CPAP    CPAP settings 3 with oxygen 2.5 (04/27/2018)  . PAF (paroxysmal atrial fibrillation) ([place])    a. Newly dx 02/2012 & initiated on Coumadin (spont converted to NSR).  . Shortness of breath dyspnea   . Transfusion history    transfusions -1 month ago -2 units    Past Surgical History:  Procedure Laterality Date  . ANGIOPLASTY    . BASAL CELL CARCINOMA EXCISION     "shoulder, arm X  2"  . CARDIAC CATHETERIZATION  2014  . CARDIAC CATHETERIZATION  2021   [facility] x1 stent  . CATARACT EXTRACTION W/ INTRAOCULAR LENS  IMPLANT, BILATERAL Bilateral 2016  . COLONOSCOPY WITH PROPOFOL N/A 10/15/2015   Procedure: COLONOSCOPY WITH PROPOFOL;  Surgeon: Gatha Mayer, MD;  Location: [facility] ENDOSCOPY;  Service: Endoscopy;  Laterality: N/A;  . CORONARY ANGIOPLASTY     Dr. Fletcher Anon  . CORONARY ARTERY BYPASS GRAFT  2011   CABG X3-4; [place], [place], [place],   . CORONARY ATHERECTOMY  04/27/2018  . CORONARY ATHERECTOMY N/A 04/27/2018   Procedure: CORONARY ATHERECTOMY;  Surgeon: Belva Crome, MD;  Location: [place] CV LAB;  Service: Cardiovascular;  Laterality: N/A;  . cyst L kidney  [place]  . ENDARTERECTOMY Left 08/30/2015   Procedure: ENDARTERECTOMY CAROTID;  Surgeon: Katha Cabal, MD;  Location: [facility] ORS;  Service: Vascular;  Laterality: Left;  . ESOPHAGOGASTRODUODENOSCOPY (EGD) WITH PROPOFOL N/A 10/15/2015   Procedure: ESOPHAGOGASTRODUODENOSCOPY (EGD) WITH PROPOFOL;  Surgeon: Gatha Mayer, MD;  Location: [facility] ENDOSCOPY;  Service: Endoscopy;  Laterality: N/A;  . ESOPHAGOGASTRODUODENOSCOPY (EGD) WITH PROPOFOL N/A 01/12/2020   Procedure: ESOPHAGOGASTRODUODENOSCOPY (EGD) WITH PROPOFOL;  Surgeon: Jerene Bears, MD;  Location: [facility] ENDOSCOPY;  Service: Gastroenterology;  Laterality: N/A;  . ESOPHAGOGASTRODUODENOSCOPY (EGD) WITH PROPOFOL N/A 01/15/2020   Procedure: ESOPHAGOGASTRODUODENOSCOPY (EGD) WITH PROPOFOL;  Surgeon: Ladene Artist, MD;  Location: [facility] ENDOSCOPY;  Service: Endoscopy;  Laterality: N/A;  . EYE SURGERY Bilateral    "has macular edema cauterized" (04/27/2018)  . HEMOSTASIS CLIP PLACEMENT  01/15/2020   Procedure: HEMOSTASIS CLIP PLACEMENT;  Surgeon: Ladene Artist, MD;  Location: [place];  Service: Endoscopy;;  . HOT HEMOSTASIS N/A 01/12/2020   Procedure: HOT HEMOSTASIS (ARGON PLASMA COAGULATION/BICAP);  Surgeon: Jerene Bears, MD;  Location:  [facility] ENDOSCOPY;  Service: Gastroenterology;  Laterality: N/A;  . HOT HEMOSTASIS N/A 01/15/2020   Procedure: HOT HEMOSTASIS (ARGON PLASMA COAGULATION/BICAP);  Surgeon: Ladene Artist, MD;  Location: [facility] ENDOSCOPY;  Service: Endoscopy;  Laterality: N/A;  . INGUINAL HERNIA REPAIR Right   . LEFT HEART CATHETERIZATION WITH CORONARY ANGIOGRAM N/A 03/09/2012   Procedure: LEFT HEART CATHETERIZATION WITH CORONARY ANGIOGRAM;  Surgeon: Wellington Hampshire, MD;  Location: [place] CATH LAB;  Service: Cardiovascular;  Laterality: N/A;  . PACEMAKER IMPLANT N/A 01/26/2020   Procedure: PACEMAKER IMPLANT;  Surgeon: Vickie Epley, MD;  Location: [place] CV LAB;  Service: Cardiovascular;  Laterality: N/A;  . PERCUTANEOUS CORONARY STENT INTERVENTION (PCI-S)  03/09/2012   Procedure: PERCUTANEOUS CORONARY STENT INTERVENTION (PCI-S);  Surgeon: Wellington Hampshire, MD;  Location: [facility] CATH LAB;  Service: Cardiovascular;;  . POLYPECTOMY  01/15/2020   Procedure: POLYPECTOMY;  Surgeon: Ladene Artist, MD;  Location: [facility] ENDOSCOPY;  Service: Endoscopy;;  . RIGHT/LEFT HEART CATH AND CORONARY ANGIOGRAPHY N/A 04/25/2018   Procedure: RIGHT/LEFT HEART CATH AND CORONARY ANGIOGRAPHY;  Surgeon: Wellington Hampshire, MD;  Location: [place] CV LAB;  Service:  Cardiovascular;  Laterality: N/A;    Family History  Problem Relation Age of Onset  . COPD Mother        alive 64  . Bladder Cancer Mother   . Heart attack Father        2013-09-02 deceased  . Lung cancer Maternal Aunt   . Liver cancer Maternal Aunt   . Kidney disease Paternal Uncle   . Cancer Other        all paternal aunts and uncles  . Prostate cancer Neg Hx   . Kidney cancer Neg Hx     SOCIAL HISTORY:   Social History  Substance Use Topics  . Smoking status: Never Smoker   . Smokeless tobacco: Never Used     Comment: tobacco use - no  . Alcohol Use: Yes     Comment: rare drink    ALLERGIES:  has No Known Allergies.  MEDICATIONS:  Current Outpatient Prescriptions   Medication Sig Dispense Refill  . acetaminophen (TYLENOL) 325 MG tablet Take 650 mg by mouth every 6 (six) hours as needed. For pain    . amiodarone (PACERONE) 200 MG tablet Take 0.5 tablets (100 mg total) by mouth daily. (Patient taking differently: Take 100 mg by mouth at bedtime. ) 45 tablet 3  . amLODipine (NORVASC) 5 MG tablet Take one tablet by mouth one time daily 90 tablet 3  . aspirin 81 MG EC tablet Take 81 mg by mouth daily.      Marland Kitchen atorvastatin (LIPITOR) 20 MG tablet Take 1 tablet (20 mg total) by mouth at bedtime. 90 tablet 3  . furosemide (LASIX) 20 MG tablet Take 1 tablet (20 mg total) by mouth as needed. 30 tablet 6  . glimepiride (AMARYL) 4 MG tablet Take 4 mg by mouth 2 (two) times daily.      . insulin glargine (LANTUS) 100 UNIT/ML injection Inject 20 Units into the skin daily.     . insulin lispro (HUMALOG) 100 UNIT/ML injection Inject 8 Units into the skin 2 (two) times daily with a meal. (Patient taking differently: Inject into the skin 3 (three) times daily. Per sliding scale.)    . metFORMIN (GLUCOPHAGE) 500 MG tablet Take 500 mg by mouth 2 (two) times daily with a meal.    . nitroGLYCERIN (NITROSTAT) 0.4 MG SL tablet Place 1 tablet (0.4 mg total) under the tongue every 5 (five) minutes as needed (up to 3 doses). 25 tablet 1  . OXYGEN Inhale 2.5 L/min into the lungs at bedtime. With CPAP    . pantoprazole (PROTONIX) 40 MG tablet Take 1 tablet (40 mg total) by mouth daily. (Patient taking differently: Take 40 mg by mouth 2 (two) times daily. ) 90 tablet 3  . ramipril (ALTACE) 10 MG capsule Take 1 capsule (10 mg total) by mouth daily. 90 capsule 5  . warfarin (COUMADIN) 5 MG tablet Take as directed by anticoagulation clinic 90 tablet 1  . IRON-VITAMIN C PO Take 1 tablet by mouth daily. Reported on 08/27/2015    . Omega-3 Fatty Acids (FISH OIL) 1200 MG CAPS Take 1 capsule by mouth daily. Reported on 08/27/2015     No current facility-administered medications for this visit.     PHYSICAL EXAMINATION:   BP 158/71 mmHg  Pulse 51  Temp(Src) 96.1 F (35.6 C)  Resp 18  Wt 192 lb 0.3 oz (87.1 kg)  Filed Weights   08/27/15 1015  Weight: 192 lb 0.3 oz (87.1 kg)    Physical Exam Constitutional:  Comments: Patient alone.  HENT:     Head: Normocephalic and atraumatic.     Mouth/Throat:     Pharynx: No oropharyngeal exudate.  Eyes:     Pupils: Pupils are equal, round, and reactive to light.  Cardiovascular:     Rate and Rhythm: Normal rate and regular rhythm.  Pulmonary:     Effort: No respiratory distress.     Breath sounds: No wheezing.  Abdominal:     General: Bowel sounds are normal. There is no distension.     Palpations: Abdomen is soft. There is no mass.     Tenderness: There is no abdominal tenderness. There is no guarding or rebound.  Musculoskeletal:        General: No tenderness. Normal range of motion.     Cervical back: Normal range of motion and neck supple.  Skin:    General: Skin is warm.  Neurological:     Mental Status: He is alert and oriented to person, place, and time.  Psychiatric:        Mood and Affect: Affect normal.      LABORATORY DATA:  I have reviewed the data as listed    Component Value Date/Time   NA 139 07/25/2015 1138   NA 139 01/22/2013 0348   K 4.1 07/25/2015 1138   K 4.2 01/22/2013 0348   CL 103 07/25/2015 1138   CL 106 01/22/2013 0348   CO2 27 07/25/2015 1138   CO2 28 01/22/2013 0348   GLUCOSE 216* 07/25/2015 1138   GLUCOSE 158* 01/22/2013 0348   BUN 14 07/25/2015 1138   BUN 16 01/22/2013 0348   CREATININE 1.03 07/25/2015 1138   CREATININE 1.02 01/22/2013 0348   CALCIUM 9.1 07/25/2015 1138   CALCIUM 8.6 01/22/2013 0348   PROT 7.2 06/25/2015 1045   PROT 7.5 01/21/2013 0209   ALBUMIN 4.1 06/25/2015 1045   ALBUMIN 4.3 01/21/2013 0209   AST 30 06/25/2015 1045   AST 28 01/21/2013 0209   ALT 33 06/25/2015 1045   ALT 36 01/21/2013 0209   ALKPHOS 104 06/25/2015 1045   ALKPHOS 143*  01/21/2013 0209   BILITOT 0.4 06/25/2015 1045   BILITOT 0.4 01/21/2013 0209   GFRNONAA >60 07/25/2015 1138   GFRNONAA >60 01/22/2013 0348   GFRAA >60 07/25/2015 1138   GFRAA >60 01/22/2013 0348    No results found for: SPEP, UPEP  Lab Results  Component Value Date   WBC 4.5 08/22/2015   NEUTROABS 2.5 08/22/2015   HGB 8.0 Repeated and verified X2.* 08/22/2015   HCT 26.2 Repeated and verified X2.* 08/22/2015   MCV 67.4 Repeated and verified X2.* 08/22/2015   PLT 264.0 08/22/2015      Chemistry      Component Value Date/Time   NA 139 07/25/2015 1138   NA 139 01/22/2013 0348   K 4.1 07/25/2015 1138   K 4.2 01/22/2013 0348   CL 103 07/25/2015 1138   CL 106 01/22/2013 0348   CO2 27 07/25/2015 1138   CO2 28 01/22/2013 0348   BUN 14 07/25/2015 1138   BUN 16 01/22/2013 0348   CREATININE 1.03 07/25/2015 1138   CREATININE 1.02 01/22/2013 0348      Component Value Date/Time   CALCIUM 9.1 07/25/2015 1138   CALCIUM 8.6 01/22/2013 0348   ALKPHOS 104 06/25/2015 1045   ALKPHOS 143* 01/21/2013 0209   AST 30 06/25/2015 1045   AST 28 01/21/2013 0209   ALT 33 06/25/2015 1045   ALT 36 01/21/2013 0209  BILITOT 0.4 06/25/2015 1045   BILITOT 0.4 01/21/2013 0209        ASSESSMENT & PLAN:   Iron deficiency anemia due to chronic blood loss #Macrocytic anemia-likely multifactorial anemia [iron deficient-likely secondary to gastric losses [gasric ulcer s/p APC] versus others-question cirrhosis.  Less likely bone marrow dysfunction.  # Today hemoglobin 12.8; hold iron infusion.  #Intermittent leucopenia-today4.2/ mild thrombocytopenia- 140-s150s- 166- likley ITP. STABLE; Monitor for now.  #CAD [aspirin 81mg /day; A. Fib [on coumadin]  # DISPOSITION:  # NO IV ferrahem today; # cancel 12/07 Ferrhem # 1 month-lab- cbc/ possible ferrahem # follow up in 2 months- MD; labs-cbc/bmp- possible ferrahem -Dr.B

## 2020-04-22 NOTE — Progress Notes (Signed)
Patient declines iv iron at next lab draw. He elected to have labs drawn on 05/16/20. He would like RN to call him with results. Dr.Brahmanday made aware of patient's preference

## 2020-04-23 DIAGNOSIS — Z0000 Encounter for general adult medical examination without abnormal findings: Secondary | ICD-10-CM | POA: Diagnosis not present

## 2020-04-23 DIAGNOSIS — E538 Deficiency of other specified B group vitamins: Secondary | ICD-10-CM | POA: Diagnosis not present

## 2020-04-23 DIAGNOSIS — G4733 Obstructive sleep apnea (adult) (pediatric): Secondary | ICD-10-CM | POA: Diagnosis not present

## 2020-04-23 DIAGNOSIS — E1165 Type 2 diabetes mellitus with hyperglycemia: Secondary | ICD-10-CM | POA: Diagnosis not present

## 2020-04-23 DIAGNOSIS — D500 Iron deficiency anemia secondary to blood loss (chronic): Secondary | ICD-10-CM | POA: Diagnosis not present

## 2020-04-23 DIAGNOSIS — Z9989 Dependence on other enabling machines and devices: Secondary | ICD-10-CM | POA: Diagnosis not present

## 2020-04-23 DIAGNOSIS — E1139 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: Secondary | ICD-10-CM | POA: Diagnosis not present

## 2020-04-30 ENCOUNTER — Other Ambulatory Visit: Payer: PPO

## 2020-04-30 ENCOUNTER — Ambulatory Visit: Payer: PPO

## 2020-04-30 ENCOUNTER — Ambulatory Visit (INDEPENDENT_AMBULATORY_CARE_PROVIDER_SITE_OTHER): Payer: PPO

## 2020-04-30 ENCOUNTER — Ambulatory Visit: Payer: PPO | Admitting: Internal Medicine

## 2020-04-30 DIAGNOSIS — I495 Sick sinus syndrome: Secondary | ICD-10-CM | POA: Diagnosis not present

## 2020-04-30 LAB — CUP PACEART REMOTE DEVICE CHECK
Battery Remaining Longevity: 150 mo
Battery Remaining Percentage: 100 %
Brady Statistic RA Percent Paced: 95 %
Brady Statistic RV Percent Paced: 0 %
Date Time Interrogation Session: 20211207050400
Implantable Lead Implant Date: 20210903
Implantable Lead Implant Date: 20210903
Implantable Lead Location: 753859
Implantable Lead Location: 753860
Implantable Lead Model: 7841
Implantable Lead Model: 7842
Implantable Lead Serial Number: 1053222
Implantable Lead Serial Number: 1093158
Implantable Pulse Generator Implant Date: 20210903
Lead Channel Impedance Value: 577 Ohm
Lead Channel Impedance Value: 643 Ohm
Lead Channel Pacing Threshold Amplitude: 0.5 V
Lead Channel Pacing Threshold Amplitude: 1 V
Lead Channel Pacing Threshold Pulse Width: 0.4 ms
Lead Channel Pacing Threshold Pulse Width: 0.4 ms
Lead Channel Setting Pacing Amplitude: 3.5 V
Lead Channel Setting Pacing Amplitude: 3.5 V
Lead Channel Setting Pacing Pulse Width: 0.4 ms
Lead Channel Setting Sensing Sensitivity: 2 mV
Pulse Gen Serial Number: 945603

## 2020-05-01 ENCOUNTER — Ambulatory Visit (INDEPENDENT_AMBULATORY_CARE_PROVIDER_SITE_OTHER): Payer: PPO

## 2020-05-01 ENCOUNTER — Encounter: Payer: Self-pay | Admitting: Cardiology

## 2020-05-01 ENCOUNTER — Encounter: Payer: PPO | Admitting: Cardiology

## 2020-05-01 ENCOUNTER — Ambulatory Visit (INDEPENDENT_AMBULATORY_CARE_PROVIDER_SITE_OTHER): Payer: PPO | Admitting: Cardiology

## 2020-05-01 ENCOUNTER — Other Ambulatory Visit: Payer: Self-pay

## 2020-05-01 VITALS — BP 138/52 | HR 61 | Ht 68.0 in | Wt 184.4 lb

## 2020-05-01 DIAGNOSIS — Z7901 Long term (current) use of anticoagulants: Secondary | ICD-10-CM

## 2020-05-01 DIAGNOSIS — Z950 Presence of cardiac pacemaker: Secondary | ICD-10-CM | POA: Diagnosis not present

## 2020-05-01 DIAGNOSIS — I4819 Other persistent atrial fibrillation: Secondary | ICD-10-CM | POA: Diagnosis not present

## 2020-05-01 DIAGNOSIS — I4891 Unspecified atrial fibrillation: Secondary | ICD-10-CM

## 2020-05-01 LAB — PACEMAKER DEVICE OBSERVATION

## 2020-05-01 LAB — POCT INR: INR: 2.8 (ref 2.0–3.0)

## 2020-05-01 NOTE — Patient Instructions (Signed)
-   continue warfarin of 1/2 a tablet daily EXCEPT 1 WHOLE TABLET ON MONDAYS & FRIDAYS - Recheck INR in 4 weeks

## 2020-05-01 NOTE — Patient Instructions (Signed)
Medication Instructions:  Your physician recommends that you continue on your current medications as directed. Please refer to the Current Medication list given to you today. *If you need a refill on your cardiac medications before your next appointment, please call your pharmacy*  Lab Work: None ordered. If you have labs (blood work) drawn today and your tests are completely normal, you will receive your results only by: Marland Kitchen MyChart Message (if you have MyChart) OR . A paper copy in the mail If you have any lab test that is abnormal or we need to change your treatment, we will call you to review the results.  Testing/Procedures: None ordered.  Follow-Up: At [facility], you and your health needs are our priority.  As part of our continuing mission to provide you with exceptional heart care, we have created designated Provider Care Teams.  These Care Teams include your primary Cardiologist (physician) and Advanced Practice Providers (APPs -  Physician Assistants and Nurse Practitioners) who all work together to provide you with the care you need, when you need it.  Your next appointment:   Your physician wants you to follow-up in: 9 months with Dr. Quentin Ore.   You will receive a reminder letter in the mail two months in advance. If you don't receive a letter, please call our office to schedule the follow-up appointment.;  Remote monitoring is used to monitor your Pacemaker from home. This monitoring reduces the number of office visits required to check your device to one time per year. It allows [place] to keep an eye on the functioning of your device to ensure it is working properly. You are scheduled for a device check from home on 07/30/2020. You may send your transmission at any time that day. If you have a wireless device, the transmission will be sent automatically. After your physician reviews your transmission, you will receive a postcard with your next transmission date.

## 2020-05-01 NOTE — Progress Notes (Signed)
Electrophysiology Office Follow up Visit Note:    Date:  05/01/2020   ID:  Seth Hardy, DOB 02-May-1946, MRN [id number]  PCP:  Rusty Aus, MD  [facility] Cardiologist:  Kathlyn Sacramento, MD  [facility] Electrophysiologist:  Vickie Epley, MD    Interval History:    Seth Hardy is a 74 y.o. male who presents for a follow up visit after pacemaker implant on January 26, 2020.  On scheduled remote interrogations since initial device implant, the device is well-functioning.  Patient tells me his anemia has improved.  He got argon laser treatment to a bleeding ulcer.  His hemoglobin was just checked and found to be significantly improved.  His energy level is improved.  No problems with his pacemaker.  He is eager to get back to golfing.    Past Medical History:  Diagnosis Date  . Basal cell carcinoma of skin 2012   removed several spots from arms and back  . CAD (coronary artery disease)    a. 01/2010 CABG x 4: LIMA->LAD, VG->D1, VG->OM1, VG->PDA. b. NSTEMI 02/2012 in setting of AF-RVR with cath s/p BMS to RCA (plan for 1 month, possibly up to 3 months of Plavix)  . Carotid disease, bilateral ([place])    a. 03/2011 U/S: 40-59% bilat Carotid dzs;  b. 04/2015 Carotid U/S: 40-59% bilat ICA stenosis; c. 05/2015 CTA Neck: 98% RICA, 92% LICA, mod-marked R vertebral stenosis, mod L vertebral stenosis.  . Chronic kidney disease    small obtusion per pcp. ultrasound done on 08/29/2015  . Diabetes mellitus type II, controlled ([place])    a. Variable CBG 02/2012 - several meds adjusted.  Marland Kitchen Dysrhythmia    intermittent Atrial Fibrillation  . GERD (gastroesophageal reflux disease)   . Heart murmur   . Hyperlipidemia   . Hypertension   . Hypertensive heart disease   . Iron deficiency anemia    "gets infusions q once in awhile" (04/27/2018)  . Iron deficiency anemia 08/2015   received 2 units rbc one week ago, 3 IV iron infusion -last 1 week.  . Macular edema bil   lazer work done  previously  . Mild aortic stenosis    a. 03/2011 Echo: EF 55-60%, Mild AS. b. Mild by cath 02/2012; c. 01/2014 Echo: EF 65-70%, Gr 1 DD, mild AS, mildly dil LA.  . Multiple gastric ulcers 2006  . Myocardial infarction ([place]) 02/2012   stents (x1) at time  . Myocardial infarction [facility])    "2nd one was after 02/2012; don't know date" (04/27/2018)  . Obesity   . On home oxygen therapy    "2.5 w/CPAP" (04/27/2018)  . OSA on CPAP    CPAP settings 3 with oxygen 2.5 (04/27/2018)  . PAF (paroxysmal atrial fibrillation) ([place])    a. Newly dx 02/2012 & initiated on Coumadin (spont converted to NSR).  . Shortness of breath dyspnea   . Transfusion history    transfusions -1 month ago -2 units    Past Surgical History:  Procedure Laterality Date  . ANGIOPLASTY    . BASAL CELL CARCINOMA EXCISION     "shoulder, arm X 2"  . CARDIAC CATHETERIZATION  2014  . CARDIAC CATHETERIZATION  2021   [facility] x1 stent  . CATARACT EXTRACTION W/ INTRAOCULAR LENS  IMPLANT, BILATERAL Bilateral 2016  . COLONOSCOPY WITH PROPOFOL N/A 10/15/2015   Procedure: COLONOSCOPY WITH PROPOFOL;  Surgeon: Gatha Mayer, MD;  Location: [facility] ENDOSCOPY;  Service: Endoscopy;  Laterality: N/A;  .  CORONARY ANGIOPLASTY     Dr. Fletcher Anon  . CORONARY ARTERY BYPASS GRAFT  2011   CABG X3-4; [place], [place], [place],   . CORONARY ATHERECTOMY  04/27/2018  . CORONARY ATHERECTOMY N/A 04/27/2018   Procedure: CORONARY ATHERECTOMY;  Surgeon: Belva Crome, MD;  Location: [place] CV LAB;  Service: Cardiovascular;  Laterality: N/A;  . cyst L kidney  [place]  . ENDARTERECTOMY Left 08/30/2015   Procedure: ENDARTERECTOMY CAROTID;  Surgeon: Katha Cabal, MD;  Location: [facility] ORS;  Service: Vascular;  Laterality: Left;  . ESOPHAGOGASTRODUODENOSCOPY (EGD) WITH PROPOFOL N/A 10/15/2015   Procedure: ESOPHAGOGASTRODUODENOSCOPY (EGD) WITH PROPOFOL;  Surgeon: Gatha Mayer, MD;  Location: [facility] ENDOSCOPY;  Service: Endoscopy;   Laterality: N/A;  . ESOPHAGOGASTRODUODENOSCOPY (EGD) WITH PROPOFOL N/A 01/12/2020   Procedure: ESOPHAGOGASTRODUODENOSCOPY (EGD) WITH PROPOFOL;  Surgeon: Jerene Bears, MD;  [facility]COPY;  Service: Gastroenterology;  Laterality: N/A;  . ESOPHAGOGASTRODUODENOSCOPY (EGD) WITH PROPOFOL N/A 01/15/2020   Procedure: ESOPHAGOGASTRODUODENOSCOPY (EGD) WITH PROPOFOL;  Surgeon: Ladene Artist, MD;  Location: [facility] ENDOSCOPY;  Service: Endoscopy;  Laterality: N/A;  . EYE SURGERY Bilateral    "has macular edema cauterized" (04/27/2018)  . HEMOSTASIS CLIP PLACEMENT  01/15/2020   Procedure: HEMOSTASIS CLIP PLACEMENT;  Surgeon: Ladene Artist, MD;  Location: [place];  Service: Endoscopy;;  . HOT HEMOSTASIS N/A 01/12/2020   Procedure: HOT HEMOSTASIS (ARGON PLASMA COAGULATION/BICAP);  Surgeon: Jerene Bears, MD;  Location: [facility] ENDOSCOPY;  Service: Gastroenterology;  Laterality: N/A;  . HOT HEMOSTASIS N/A 01/15/2020   Procedure: HOT HEMOSTASIS (ARGON PLASMA COAGULATION/BICAP);  Surgeon: Ladene Artist, MD;  Location: [facility] ENDOSCOPY;  Service: Endoscopy;  Laterality: N/A;  . INGUINAL HERNIA REPAIR Right   . LEFT HEART CATHETERIZATION WITH CORONARY ANGIOGRAM N/A 03/09/2012   Procedure: LEFT HEART CATHETERIZATION WITH CORONARY ANGIOGRAM;  Surgeon: Wellington Hampshire, MD;  Location: [place] CATH LAB;  Service: Cardiovascular;  Laterality: N/A;  . PACEMAKER IMPLANT N/A 01/26/2020   Procedure: PACEMAKER IMPLANT;  Surgeon: Vickie Epley, MD;  Location: [place] CV LAB;  Service: Cardiovascular;  Laterality: N/A;  . PERCUTANEOUS CORONARY STENT INTERVENTION (PCI-S)  03/09/2012   Procedure: PERCUTANEOUS CORONARY STENT INTERVENTION (PCI-S);  Surgeon: Wellington Hampshire, MD;  Location[facility] CATH LAB;  Service: Cardiovascular;;  . POLYPECTOMY  01/15/2020   Procedure: POLYPECTOMY;  Surgeon: Ladene Artist, MD;  Location: [facility] ENDOSCOPY;  Service: Endoscopy;;  . RIGHT/LEFT HEART CATH AND CORONARY ANGIOGRAPHY N/A 04/25/2018    Procedure: RIGHT/LEFT HEART CATH AND CORONARY ANGIOGRAPHY;  Surgeon: Wellington Hampshire, MD;  Location: [place] CV LAB;  Service: Cardiovascular;  Laterality: N/A;    Current Medications: Current Meds  Medication Sig  . ALPRAZolam (XANAX) 0.25 MG tablet Take 0.25 mg by mouth daily as needed for anxiety.  Marland Kitchen amiodarone (PACERONE) 200 MG tablet Take 200 mg by mouth daily.  Marland Kitchen amLODipine (NORVASC) 5 MG tablet Take one tablet by mouth one time daily  . aspirin EC 81 MG EC tablet Take 1 tablet (81 mg total) by mouth daily. Swallow whole.  . Cyanocobalamin (B-12) 1000 MCG CAPS Take 1,000 mcg by mouth every evening.   . Ferrous Gluconate-C-Folic Acid (IRON-C PO) Take 1 tablet by mouth daily.  . furosemide (LASIX) 20 MG tablet Take 20 mg by mouth daily as needed for edema.  Marland Kitchen glimepiride (AMARYL) 4 MG tablet Take 4 mg by mouth daily.   . insulin glargine (LANTUS) 100 UNIT/ML injection Inject 15 Units into the skin  daily.   . isosorbide mononitrate (IMDUR) 30 MG 24 hr tablet TAKE 1 TABLET BY MOUTH DAILY.  . metoprolol tartrate (LOPRESSOR) 25 MG tablet Take 1 tablet (25 mg) by mouth twice daily as needed for palpitations  . NOVOLOG FLEXPEN 100 UNIT/ML FlexPen Inject 6-36 Units into the skin 2 (two) times daily with a meal.   . Omega-3 Fatty Acids (FISH OIL) 1000 MG CAPS Take 1,000 mg by mouth every evening.  . OXYGEN Inhale 2.5 L/min into the lungs at bedtime. With CPAP  . pantoprazole (PROTONIX) 40 MG tablet Take 1 tablet (40 mg total) by mouth daily.  . Polyethylene Glycol 400 (BLINK TEARS OP) Place 1 drop into both eyes daily as needed (dry eyes).  . ramipril (ALTACE) 10 MG capsule Take 10 mg by mouth daily.   . rosuvastatin (CRESTOR) 40 MG tablet TAKE ONE TABLET BY MOUTH EVERY DAY  . sucralfate (CARAFATE) 1 g tablet Take 1 g by mouth 2 (two) times daily.  . tadalafil (CIALIS) 10 MG tablet Take 10 mg by mouth as needed.  . vitamin C (ASCORBIC ACID) 500 MG tablet Take 500 mg by mouth every  evening.  . warfarin (COUMADIN) 5 MG tablet TAKE ONE TABLET MON AND FRI THEN TAKE ONE-HALF (1/2) TABLET EVERY DAY ALL OTHER DAYS     Allergies:   Patient has no known allergies.   Social History   Socioeconomic History  . Marital status: Married    Spouse name: Mary  . Number of children: 2  . Years of education: Not on file  . Highest education level: Not on file  Occupational History  . Occupation: retired  Tobacco Use  . Smoking status: Never Smoker  . Smokeless tobacco: Never Used  . Tobacco comment: tobacco use - no.no passive smoke in home  Vaping Use  . Vaping Use: Never used  Substance and Sexual Activity  . Alcohol use: Yes    Comment: 04/27/2018 "couple drinks/year; if that"  . Drug use: Never  . Sexual activity: Not Currently  Other Topics Concern  . Not on file  Social History Narrative   Full time. Gets regular exercise. Lives in [place] with his wife.  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   Social Determinants of Radio broadcast assistant Strain:   . Difficulty of Paying Living Expenses: Not on file  Food Insecurity:   . Worried About Charity fundraiser in the Last Year: Not on file  . Ran Out of Food in the Last Year: Not on file  Transportation Needs:   . Lack of Transportation (Medical): Not on file  . Lack of Transportation (Non-Medical): Not on file  Physical Activity:   . Days of Exercise per Week: Not on file  . Minutes of Exercise per Session: Not on file  Stress:   . Feeling of Stress : Not on file  Social Connections:   . Frequency of Communication with Friends and Family: Not on file  . Frequency of Social Gatherings with Friends and Family: Not on file  . Attends Religious Services: Not on file  . Active Member of Clubs or Organizations: Not on file  . Attends Archivist Meetings: Not on file  . Marital Status: Not on file     Family History: [name] family history includes Bladder Cancer in his mother; COPD in his  mother; Cancer in an other family member; Heart attack in his father; Kidney disease in his paternal uncle; Liver cancer in his maternal  aunt; Lung cancer in his maternal aunt. There is no history of Prostate cancer or Kidney cancer.  ROS:   Please see the history of present illness.    All other systems reviewed and are negative.  EKGs/Labs/Other Studies Reviewed:    The following studies were reviewed today: In person device interrogation, prior notes  May 01, 2020 in clinic device interrogation personally reviewed Changes made include changing the atrial and ventricular amplitudes to a-2.0 V, V-2.5 V Change the minute ventilation response factor from 4-6 Battery longevity estimated at 15 years Atrial lead impedance 579 ohms, sensing 6.8 mV, threshold 0.7-0.4 Ventricular lead impedance 665 ohms, sensing 14.1 mV, pacing threshold 0.9-0.4 DDDR/AAIR with VVI backup 60-1 30 No atrial or ventricular arrhythmias Atrially paced 95% the time Histogram shows almost 100% of his heart rates are 60 to 70 bpm  EKG:  The ekg ordered today demonstrates atrial paced, ventricular sensed rhythm  Recent Labs: 12/26/2019: TSH 3.237 01/11/2020: ALT 27 01/13/2020: Magnesium 2.2 04/15/2020: BUN 23; Creatinine, Ser 1.49; Hemoglobin 12.8; Platelets 170; Potassium 4.8; Sodium 136  Recent Lipid Panel    Component Value Date/Time   CHOL 105 12/26/2019 0813   TRIG 123 12/26/2019 0813   HDL 36 (L) 12/26/2019 0813   CHOLHDL 2.9 12/26/2019 0813   VLDL 25 12/26/2019 0813   LDLCALC 44 12/26/2019 0813    Physical Exam:    VS:  BP (!) 138/52   Pulse 61   Ht 5\' 8"  (1.727 m)   Wt 184 lb 6.4 oz (83.6 kg)   SpO2 99%   BMI 28.04 kg/m     Wt Readings from Last 3 Encounters:  05/01/20 184 lb 6.4 oz (83.6 kg)  04/22/20 185 lb (83.9 kg)  03/11/20 181 lb (82.1 kg)     GEN: Well nourished, well developed in no acute distress HEENT: Normal NECK: No JVD; No carotid bruits LYMPHATICS: No  lymphadenopathy CARDIAC: RRR, no murmurs, rubs, gallops.  Pacemaker pocket well-healed. RESPIRATORY:  Clear to auscultation without rales, wheezing or rhonchi  ABDOMEN: Soft, non-tender, non-distended MUSCULOSKELETAL:  No edema; No deformity  SKIN: Warm and dry NEUROLOGIC:  Alert and oriented x 3 PSYCHIATRIC:  Normal affect   ASSESSMENT:    1. Cardiac pacemaker in situ   2. Persistent atrial fibrillation (HCC)    PLAN:    In order of problems listed above:  1. Permanent pacemaker in situ Device well functioning on today's interrogation.  We did change the rate response algorithm to hopefully provide a little bit more heart rate variability.  2.  Persistent atrial fibrillation Remains in normal rhythm on amiodarone 200 mg once daily.  On Coumadin for anticoagulation.  Follow-up 9 months  Medication Adjustments/Labs and Tests Ordered: Current medicines are reviewed at length with the patient today.  Concerns regarding medicines are outlined above.  No orders of the defined types were placed in this encounter.  No orders of the defined types were placed in this encounter.    Signed, Lars Mage, MD, [facility]  05/01/2020 11:45 AM    Electrophysiology [facility]

## 2020-05-13 NOTE — Progress Notes (Signed)
Remote pacemaker transmission.   

## 2020-05-16 ENCOUNTER — Inpatient Hospital Stay: Payer: PPO | Attending: Oncology

## 2020-05-16 ENCOUNTER — Ambulatory Visit: Payer: PPO

## 2020-05-16 DIAGNOSIS — D500 Iron deficiency anemia secondary to blood loss (chronic): Secondary | ICD-10-CM | POA: Diagnosis not present

## 2020-05-16 LAB — BASIC METABOLIC PANEL
Anion gap: 10 (ref 5–15)
BUN: 29 mg/dL — ABNORMAL HIGH (ref 8–23)
CO2: 23 mmol/L (ref 22–32)
Calcium: 8.7 mg/dL — ABNORMAL LOW (ref 8.9–10.3)
Chloride: 101 mmol/L (ref 98–111)
Creatinine, Ser: 1.79 mg/dL — ABNORMAL HIGH (ref 0.61–1.24)
GFR, Estimated: 39 mL/min — ABNORMAL LOW (ref >60)
Glucose, Bld: 316 mg/dL — ABNORMAL HIGH (ref 70–99)
Potassium: 4.8 mmol/L (ref 3.5–5.1)
Sodium: 134 mmol/L — ABNORMAL LOW (ref 135–145)

## 2020-05-16 LAB — CBC WITH DIFFERENTIAL/PLATELET
Abs Immature Granulocytes: 0.01 10*3/uL (ref 0.00–0.07)
Basophils Absolute: 0 10*3/uL (ref 0.0–0.1)
Basophils Relative: 1 %
Eosinophils Absolute: 0.1 10*3/uL (ref 0.0–0.5)
Eosinophils Relative: 3 %
HCT: 34.4 % — ABNORMAL LOW (ref 39.0–52.0)
Hemoglobin: 11 g/dL — ABNORMAL LOW (ref 13.0–17.0)
Immature Granulocytes: 0 %
Lymphocytes Relative: 15 %
Lymphs Abs: 0.6 10*3/uL — ABNORMAL LOW (ref 0.7–4.0)
MCH: 32.9 pg (ref 26.0–34.0)
MCHC: 32 g/dL (ref 30.0–36.0)
MCV: 103 fL — ABNORMAL HIGH (ref 80.0–100.0)
Monocytes Absolute: 0.4 10*3/uL (ref 0.1–1.0)
Monocytes Relative: 10 %
Neutro Abs: 2.9 10*3/uL (ref 1.7–7.7)
Neutrophils Relative %: 71 %
Platelets: 194 10*3/uL (ref 150–400)
RBC: 3.34 MIL/uL — ABNORMAL LOW (ref 4.22–5.81)
RDW: 13.8 % (ref 11.5–15.5)
WBC: 4.1 10*3/uL (ref 4.0–10.5)
nRBC: 0 % (ref 0.0–0.2)

## 2020-05-16 LAB — FERRITIN: Ferritin: 103 ng/mL (ref 24–336)

## 2020-05-16 LAB — IRON AND TIBC
Iron: 112 ug/dL (ref 45–182)
Saturation Ratios: 35 % (ref 17.9–39.5)
TIBC: 318 ug/dL (ref 250–450)
UIBC: 206 ug/dL

## 2020-05-20 ENCOUNTER — Other Ambulatory Visit: Payer: PPO

## 2020-05-20 ENCOUNTER — Ambulatory Visit: Payer: PPO

## 2020-06-05 ENCOUNTER — Ambulatory Visit (INDEPENDENT_AMBULATORY_CARE_PROVIDER_SITE_OTHER): Payer: HMO

## 2020-06-05 ENCOUNTER — Other Ambulatory Visit: Payer: Self-pay

## 2020-06-05 DIAGNOSIS — Z7901 Long term (current) use of anticoagulants: Secondary | ICD-10-CM

## 2020-06-05 DIAGNOSIS — I4891 Unspecified atrial fibrillation: Secondary | ICD-10-CM | POA: Diagnosis not present

## 2020-06-05 DIAGNOSIS — I4819 Other persistent atrial fibrillation: Secondary | ICD-10-CM | POA: Diagnosis not present

## 2020-06-05 LAB — POCT INR: INR: 3.5 — AB (ref 2.0–3.0)

## 2020-06-05 NOTE — Patient Instructions (Signed)
-   skip warfarin tonight, then -  continue warfarin of 1/2 a tablet daily EXCEPT 1 WHOLE TABLET ON MONDAYS & FRIDAYS - Recheck INR in 3 weeks

## 2020-06-12 ENCOUNTER — Other Ambulatory Visit
Admission: RE | Admit: 2020-06-12 | Discharge: 2020-06-12 | Disposition: A | Discharge status: Home / Self Care | Payer: HMO | Source: Ambulatory Visit | Attending: Cardiovascular Disease | Admitting: Cardiovascular Disease

## 2020-06-12 ENCOUNTER — Telehealth: Payer: Self-pay | Admitting: Cardiovascular Disease

## 2020-06-12 ENCOUNTER — Ambulatory Visit (INDEPENDENT_AMBULATORY_CARE_PROVIDER_SITE_OTHER): Payer: HMO | Admitting: Cardiovascular Disease

## 2020-06-12 ENCOUNTER — Other Ambulatory Visit: Payer: Self-pay

## 2020-06-12 ENCOUNTER — Encounter: Payer: Self-pay | Admitting: Cardiovascular Disease

## 2020-06-12 VITALS — BP 134/50 | HR 62 | Ht 68.0 in | Wt 185.0 lb

## 2020-06-12 DIAGNOSIS — I6523 Occlusion and stenosis of bilateral carotid arteries: Secondary | ICD-10-CM

## 2020-06-12 DIAGNOSIS — B349 Viral infection, unspecified: Secondary | ICD-10-CM | POA: Diagnosis not present

## 2020-06-12 DIAGNOSIS — I25708 Atherosclerosis of coronary artery bypass graft(s), unspecified, with other forms of angina pectoris: Secondary | ICD-10-CM | POA: Diagnosis not present

## 2020-06-12 DIAGNOSIS — I4819 Other persistent atrial fibrillation: Secondary | ICD-10-CM | POA: Diagnosis not present

## 2020-06-12 DIAGNOSIS — E785 Hyperlipidemia, unspecified: Secondary | ICD-10-CM | POA: Diagnosis not present

## 2020-06-12 DIAGNOSIS — I350 Nonrheumatic aortic (valve) stenosis: Secondary | ICD-10-CM

## 2020-06-12 LAB — CBC WITH DIFFERENTIAL/PLATELET
Abs Immature Granulocytes: 0.01 10*3/uL (ref 0.00–0.07)
Basophils Absolute: 0 10*3/uL (ref 0.0–0.1)
Basophils Relative: 0 %
Eosinophils Absolute: 0 10*3/uL (ref 0.0–0.5)
Eosinophils Relative: 1 %
HCT: 39 % (ref 39.0–52.0)
Hemoglobin: 12.3 g/dL — ABNORMAL LOW (ref 13.0–17.0)
Immature Granulocytes: 0 %
Lymphocytes Relative: 15 %
Lymphs Abs: 0.5 10*3/uL — ABNORMAL LOW (ref 0.7–4.0)
MCH: 32.5 pg (ref 26.0–34.0)
MCHC: 31.5 g/dL (ref 30.0–36.0)
MCV: 102.9 fL — ABNORMAL HIGH (ref 80.0–100.0)
Monocytes Absolute: 0.6 10*3/uL (ref 0.1–1.0)
Monocytes Relative: 17 %
Neutro Abs: 2.3 10*3/uL (ref 1.7–7.7)
Neutrophils Relative %: 67 %
Platelets: 157 10*3/uL (ref 150–400)
RBC: 3.79 MIL/uL — ABNORMAL LOW (ref 4.22–5.81)
RDW: 12.7 % (ref 11.5–15.5)
WBC: 3.5 10*3/uL — ABNORMAL LOW (ref 4.0–10.5)
nRBC: 0 % (ref 0.0–0.2)

## 2020-06-12 LAB — BASIC METABOLIC PANEL
Anion gap: 10 (ref 5–15)
BUN: 19 mg/dL (ref 8–23)
CO2: 26 mmol/L (ref 22–32)
Calcium: 8.8 mg/dL — ABNORMAL LOW (ref 8.9–10.3)
Chloride: 100 mmol/L (ref 98–111)
Creatinine, Ser: 1.67 mg/dL — ABNORMAL HIGH (ref 0.61–1.24)
GFR, Estimated: 43 mL/min — ABNORMAL LOW (ref >60)
Glucose, Bld: 309 mg/dL — ABNORMAL HIGH (ref 70–99)
Potassium: 5 mmol/L (ref 3.5–5.1)
Sodium: 136 mmol/L (ref 135–145)

## 2020-06-12 LAB — TROPONIN I (HIGH SENSITIVITY): Troponin I (High Sensitivity): 7 ng/L (ref <18)

## 2020-06-12 NOTE — Telephone Encounter (Signed)
Pt c/o of Chest Pain: STAT if CP now or developed within 24 hours  1. Are you having CP right now? Yes, dull pain  2. Are you experiencing any other symptoms (ex. SOB, nausea, vomiting, sweating)? No other symptoms  3. How long have you been experiencing CP?  Since this morning  4. Is your CP continuous or coming and going? continual  5. Have you taken Nitroglycerin? no ?

## 2020-06-12 NOTE — Patient Instructions (Signed)
Medication Instructions:  Your physician recommends that you continue on your current medications as directed. Please refer to the Current Medication list given to you today.  *If you need a refill on your cardiac medications before your next appointment, please call your pharmacy*   Lab Work: STAT labs today Troponin, Cbc, Bmp  Please have your labs drawn at the [facility] medical mall.  If you have labs (blood work) drawn today and your tests are completely normal, you will receive your results only by: Marland Kitchen MyChart Message (if you have MyChart) OR . A paper copy in the mail If you have any lab test that is abnormal or we need to change your treatment, we will call you to review the results.   Testing/Procedures: None ordered   Follow-Up: At [facility], you and your health needs are our priority.  As part of our continuing mission to provide you with exceptional heart care, we have created designated Provider Care Teams.  These Care Teams include your primary Cardiologist (physician) and Advanced Practice Providers (APPs -  Physician Assistants and Nurse Practitioners) who all work together to provide you with the care you need, when you need it.  We recommend signing up for the patient portal called "MyChart".  Sign up information is provided on this After Visit Summary.  MyChart is used to connect with patients for Virtual Visits (Telemedicine).  Patients are able to view lab/test results, encounter notes, upcoming appointments, etc.  Non-urgent messages can be sent to your provider as well.   To learn more about what you can do with MyChart, go to [email or web address].    Your next appointment:   3 month(s)  The format for your next appointment:   In Person  Provider:   You may see Kathlyn Sacramento, MD or one of the following Advanced Practice Providers on your designated Care Team:    Murray Hodgkins, NP  Christell Faith, PA-C  Marrianne Mood, PA-C  Cadence Enfield,  [place]  Laurann Montana, NP    Other Instructions N/A

## 2020-06-12 NOTE — Progress Notes (Signed)
Cardiology Office Note   Date:  06/12/2020   ID:  Seth CrumbleMark D Jha, DOB 08/29/1945, MRN [id number]  PCP:  Danella PentonMiller, Odin F, MD  Cardiologist:   Lorine BearsMuhammad Amanee Iacovelli, MD   Chief Complaint  Patient presents with  . Other    Patient c.o Dull chest pain that radiates to his back - took a few nitros and it didn't help. Meds reviewed vebrally with patient.       History of Present Illness: Seth Hardy is a 75 y.o. male who Is here today for a follow-up visit regarding multiple cardiac issues. He has a history of coronary artery disease and is status CABG in September 2011 with subsequent bare metal stenting of the right coronary artery in 2013. He also has a history of persistent atrial fibrillation maintained in sinus rhythm on small dose amiodarone and is chronically anticoagulated on Coumadin. He has other chronic medical conditions that include aortic stenosis, sleep apnea on CPAP hypertension, hyperlipidemia, bilateral carotid arterial disease status post left carotid endarterectomy in April 2017, and diabetes.  He has known history of iron deficiency anemia due to gastric polyp which was resected.  He was seen in November of 2019 for worsening angina.  I repeated his echocardiogram which showed normal LV systolic function.  Aortic stenosis was found to be moderate with a mean gradient of 27 mmHg. A right and left cardiac catheterization was performed in December 20 19 which showed significant underlying three-vessel coronary artery disease with patent LIMA to LAD and SVG to OM with known chronically occluded SVG to RCA.  Proximal RCA stent was patent with only mild in-stent restenosis.  However, he was found to have severe calcified stenosis of the distal RCA.  Right heart catheterization showed mildly elevated filling pressures with normal cardiac output.  Aortic stenosis was moderate with a mean gradient of 20 mmHg. The patient underwent staged orbital atherectomy of the distal right  coronary artery with drug-eluting stent placement.   The patient was hospitalized at [facility] [place] in April of 2020 after he presented with A. fib with RVR and non-STEMI.    He underwent left heart catheterization which showed significant restenosis in the distal RCA stent.  This was treated with PCI and adding another drug-eluting stent.   He was [facility] at [facility] in August, 2021 with A. fib with RVR.  He converted sinus rhythm with IV amiodarone.  He was previously on amiodarone 100 mg once daily and this was increased to 200 mg twice daily for 1 week then down to 200 mg once daily.  His troponin was mildly elevated felt to be due to supply demand ischemia.  Echocardiogram was repeated which showed an EF of 60 to 65%, grade 2 diastolic dysfunction and stable moderate aortic stenosis.  The patient is noted to have iron deficiency anemia currently managed by hematology.  He was readmitted to the hospital a second time in August for symptomatic anemia. Treatment of atrial fibrillation was difficult due to underlying bradycardia and ultimately underwent permanent pacemaker placement by Dr. Lalla BrothersLambert in September.  He did very well after that and his anemia improved. He had headache yesterday and he has been feeling off yesterday and today.  He had dry cough but no shortness of breath.  In addition, he started having substernal chest pain this morning radiating to his back.  It is described as dull aching sensation and different from his prior angina.  He took 2 nitroglycerin  with no change in symptoms.  The pain is mild overall and does not worsen with physical activities.  He did notice that his heart rate was slightly above his baseline at 80.  No fevers or chills.  No COVID contacts.  He did go to his primary care physician today to get a COVID test anyway.  The results are pending.    Past Medical History:  Diagnosis Date  . Basal cell carcinoma of skin 2012   removed  several spots from arms and back  . CAD (coronary artery disease)    a. 01/2010 CABG x 4: LIMA->LAD, VG->D1, VG->OM1, VG->PDA. b. NSTEMI 02/2012 in setting of AF-RVR with cath s/p BMS to RCA (plan for 1 month, possibly up to 3 months of Plavix)  . Carotid disease, bilateral ([place])    a. 03/2011 U/S: 40-59% bilat Carotid dzs;  b. 04/2015 Carotid U/S: 40-59% bilat ICA stenosis; c. 05/2015 CTA Neck: 48% RICA, 25% LICA, mod-marked R vertebral stenosis, mod L vertebral stenosis.  . Chronic kidney disease    small obtusion per pcp. ultrasound done on 08/29/2015  . Diabetes mellitus type II, controlled ([place])    a. Variable CBG 02/2012 - several meds adjusted.  Marland Kitchen Dysrhythmia    intermittent Atrial Fibrillation  . GERD (gastroesophageal reflux disease)   . Heart murmur   . Hyperlipidemia   . Hypertension   . Hypertensive heart disease   . Iron deficiency anemia    "gets infusions q once in awhile" (04/27/2018)  . Iron deficiency anemia 08/2015   received 2 units rbc one week ago, 3 IV iron infusion -last 1 week.  . Macular edema bil   lazer work done previously  . Mild aortic stenosis    a. 03/2011 Echo: EF 55-60%, Mild AS. b. Mild by cath 02/2012; c. 01/2014 Echo: EF 65-70%, Gr 1 DD, mild AS, mildly dil LA.  . Multiple gastric ulcers 2006  . Myocardial infarction ([place]) 02/2012   stents (x1) at time  . Myocardial infarction [facility])    "2nd one was after 02/2012; don't know date" (04/27/2018)  . Obesity   . On home oxygen therapy    "2.5 w/CPAP" (04/27/2018)  . OSA on CPAP    CPAP settings 3 with oxygen 2.5 (04/27/2018)  . PAF (paroxysmal atrial fibrillation) ([place])    a. Newly dx 02/2012 & initiated on Coumadin (spont converted to NSR).  . Shortness of breath dyspnea   . Transfusion history    transfusions -1 month ago -2 units    Past Surgical History:  Procedure Laterality Date  . ANGIOPLASTY    . BASAL CELL CARCINOMA EXCISION     "shoulder, arm X 2"  . CARDIAC CATHETERIZATION  2014  .  CARDIAC CATHETERIZATION  2021   [facility] x1 stent  . CATARACT EXTRACTION W/ INTRAOCULAR LENS  IMPLANT, BILATERAL Bilateral 2016  . COLONOSCOPY WITH PROPOFOL N/A 10/15/2015   Procedure: COLONOSCOPY WITH PROPOFOL;  Surgeon: Gatha Mayer, MD;  Location: [facility] ENDOSCOPY;  Service: Endoscopy;  Laterality: N/A;  . CORONARY ANGIOPLASTY     Dr. Fletcher Anon  . CORONARY ARTERY BYPASS GRAFT  2011   CABG X3-4; [place], [place], [place],   . CORONARY ATHERECTOMY  04/27/2018  . CORONARY ATHERECTOMY N/A 04/27/2018   Procedure: CORONARY ATHERECTOMY;  Surgeon: Belva Crome, MD;  Location: [place] CV LAB;  Service: Cardiovascular;  Laterality: N/A;  . cyst L kidney  [place]  . ENDARTERECTOMY Left  08/30/2015   Procedure: ENDARTERECTOMY CAROTID;  Surgeon: Katha Cabal, MD;  Location: [facility] ORS;  Service: Vascular;  Laterality: Left;  . ESOPHAGOGASTRODUODENOSCOPY (EGD) WITH PROPOFOL N/A 10/15/2015   Procedure: ESOPHAGOGASTRODUODENOSCOPY (EGD) WITH PROPOFOL;  Surgeon: Gatha Mayer, MD;  Location: [facility] ENDOSCOPY;  Service: Endoscopy;  Laterality: N/A;  . ESOPHAGOGASTRODUODENOSCOPY (EGD) WITH PROPOFOL N/A 01/12/2020   Procedure: ESOPHAGOGASTRODUODENOSCOPY (EGD) WITH PROPOFOL;  Surgeon: Jerene Bears, MD;  Location: [facility] ENDOSCOPY;  Service: Gastroenterology;  Laterality: N/A;  . ESOPHAGOGASTRODUODENOSCOPY (EGD) WITH PROPOFOL N/A 01/15/2020   Procedure: ESOPHAGOGASTRODUODENOSCOPY (EGD) WITH PROPOFOL;  Surgeon: Ladene Artist, MD;  Location: [facility] ENDOSCOPY;  Service: Endoscopy;  Laterality: N/A;  . EYE SURGERY Bilateral    "has macular edema cauterized" (04/27/2018)  . HEMOSTASIS CLIP PLACEMENT  01/15/2020   Procedure: HEMOSTASIS CLIP PLACEMENT;  Surgeon: Ladene Artist, MD;  Location: [place];  Service: Endoscopy;;  . HOT HEMOSTASIS N/A 01/12/2020   Procedure: HOT HEMOSTASIS (ARGON PLASMA COAGULATION/BICAP);  Surgeon: Jerene Bears, MD;  Location: [facility] ENDOSCOPY;  Service:  Gastroenterology;  Laterality: N/A;  . HOT HEMOSTASIS N/A 01/15/2020   Procedure: HOT HEMOSTASIS (ARGON PLASMA COAGULATION/BICAP);  Surgeon: Ladene Artist, MD;  Location: [facility] ENDOSCOPY;  Service: Endoscopy;  Laterality: N/A;  . INGUINAL HERNIA REPAIR Right   . LEFT HEART CATHETERIZATION WITH CORONARY ANGIOGRAM N/A 03/09/2012   Procedure: LEFT HEART CATHETERIZATION WITH CORONARY ANGIOGRAM;  Surgeon: Wellington Hampshire, MD;  Location: [place] CATH LAB;  Service: Cardiovascular;  Laterality: N/A;  . PACEMAKER IMPLANT N/A 01/26/2020   Procedure: PACEMAKER IMPLANT;  Surgeon: Vickie Epley, MD;  Location: [place] CV LAB;  Service: Cardiovascular;  Laterality: N/A;  . PERCUTANEOUS CORONARY STENT INTERVENTION (PCI-S)  03/09/2012   Procedure: PERCUTANEOUS CORONARY STENT INTERVENTION (PCI-S);  Surgeon: Wellington Hampshire, MD;  Location: [facility] CATH LAB;  Service: Cardiovascular;;  . POLYPECTOMY  01/15/2020   Procedure: POLYPECTOMY;  Surgeon: Ladene Artist, MD;  Location: [facility] ENDOSCOPY;  Service: Endoscopy;;  . RIGHT/LEFT HEART CATH AND CORONARY ANGIOGRAPHY N/A 04/25/2018   Procedure: RIGHT/LEFT HEART CATH AND CORONARY ANGIOGRAPHY;  Surgeon: Wellington Hampshire, MD;  Location: [place] CV LAB;  Service: Cardiovascular;  Laterality: N/A;     Current Outpatient Medications  Medication Sig Dispense Refill  . ALPRAZolam (XANAX) 0.25 MG tablet Take 0.25 mg by mouth daily as needed for anxiety.    Marland Kitchen amiodarone (PACERONE) 200 MG tablet Take 200 mg by mouth daily.    Marland Kitchen amiodarone (PACERONE) 200 MG tablet Take 200 mg by mouth daily.    Marland Kitchen amLODipine (NORVASC) 5 MG tablet Take one tablet by mouth one time daily 90 tablet 3  . aspirin EC 81 MG EC tablet Take 1 tablet (81 mg total) by mouth daily. Swallow whole. 30 tablet 11  . Cyanocobalamin (B-12) 1000 MCG CAPS Take 1,000 mcg by mouth every evening.     . Ferrous Gluconate-C-Folic Acid (IRON-C PO) Take 1 tablet by mouth daily.    . furosemide (LASIX) 20 MG tablet  Take 20 mg by mouth daily as needed for edema.    Marland Kitchen glimepiride (AMARYL) 4 MG tablet Take 4 mg by mouth daily.    . insulin glargine (LANTUS) 100 UNIT/ML injection Inject 15 Units into the skin daily.     . isosorbide mononitrate (IMDUR) 30 MG 24 hr tablet TAKE 1 TABLET BY MOUTH DAILY. 30 tablet 3  . metoprolol tartrate (LOPRESSOR) 25 MG tablet Take 1 tablet (25 mg) by mouth twice daily as  needed for palpitations 30 tablet 3  . NOVOLOG FLEXPEN 100 UNIT/ML FlexPen Inject 6-36 Units into the skin 2 (two) times daily with a meal.     . Omega-3 Fatty Acids (FISH OIL) 1000 MG CAPS Take 1,000 mg by mouth every evening.    . OXYGEN Inhale 2.5 L/min into the lungs at bedtime. With CPAP    . pantoprazole (PROTONIX) 40 MG tablet Take 1 tablet (40 mg total) by mouth daily. 30 tablet 0  . Polyethylene Glycol 400 (BLINK TEARS OP) Place 1 drop into both eyes daily as needed (dry eyes).    . ramipril (ALTACE) 10 MG capsule Take 10 mg by mouth daily.     . rosuvastatin (CRESTOR) 40 MG tablet TAKE ONE TABLET BY MOUTH EVERY DAY 90 tablet 0  . sucralfate (CARAFATE) 1 g tablet Take 1 g by mouth 2 (two) times daily.    . tadalafil (CIALIS) 10 MG tablet Take 10 mg by mouth as needed.    . vitamin C (ASCORBIC ACID) 500 MG tablet Take 500 mg by mouth every evening.    . warfarin (COUMADIN) 5 MG tablet TAKE ONE TABLET MON AND FRI THEN TAKE ONE-HALF (1/2) TABLET EVERY DAY ALL OTHER DAYS 90 tablet 0   No current facility-administered medications for this visit.    Allergies:   Patient has no known allergies.    Social History:  The patient  reports that he has never smoked. He has never used smokeless tobacco. He reports current alcohol use. He reports that he does not use drugs.   Family History:  [name] family history includes Bladder Cancer in his mother; COPD in his mother; Cancer in an other family member; Heart attack in his father; Kidney disease in his paternal uncle; Liver cancer in his maternal aunt;  Lung cancer in his maternal aunt.    ROS:  Please see the history of present illness.   Otherwise, review of systems are positive for none.   All other systems are reviewed and negative.    PHYSICAL EXAM: VS:  BP (!) 134/50 (BP Location: Left Arm, Patient Position: Sitting, Cuff Size: Normal)   Pulse 62   Ht 5\' 8"  (1.727 m)   Wt 185 lb (83.9 kg)   SpO2 98%   BMI 28.13 kg/m  , BMI Body mass index is 28.13 kg/m. GEN: Well nourished, well developed, in no acute distress  HEENT: normal  Neck: no JVD, carotid bruits, or masses Cardiac: RRR; no rubs, or gallops, mild bilateral leg edema . There is a 3/6 crescendo decrescendo systolic murmur in the aortic area which is mid peaking.  S2 is diminished.  The murmur radiates to the carotid arteries. Respiratory:  clear to auscultation bilaterally, normal work of breathing GI: soft, nontender, nondistended, + BS MS: no deformity or atrophy  Skin: warm and dry, no rash Neuro:  Strength and sensation are intact Psych: euthymic mood, full affect    EKG:  EKG  ordered today. EKG showed atrial paced rhythm with inferior lateral T wave changes suggestive of ischemia.  These changes are not new compared to his most recent EKG.   Recent Labs: 12/26/2019: TSH 3.237 01/11/2020: ALT 27 01/13/2020: Magnesium 2.2 05/16/2020: BUN 29; Creatinine, Ser 1.79; Hemoglobin 11.0; Platelets 194; Potassium 4.8; Sodium 134    Lipid Panel    Component Value Date/Time   CHOL 105 12/26/2019 0813   TRIG 123 12/26/2019 0813   HDL 36 (L) 12/26/2019 0813   CHOLHDL 2.9 12/26/2019  0813   VLDL 25 12/26/2019 0813   LDLCALC 44 12/26/2019 0813      Wt Readings from Last 3 Encounters:  06/12/20 185 lb (83.9 kg)  05/01/20 184 lb 6.4 oz (83.6 kg)  04/22/20 185 lb (83.9 kg)        ASSESSMENT AND PLAN:   1.  Coronary artery disease involving bypass graft with other forms of angina: His current chest pain is different from his prior angina and seems to be more  suggestive of a musculoskeletal etiology.  He has significant tenderness.  Nonetheless, the patient did have atypical presentations in the past and thus am going to check stat troponin.  If his troponin is normal, I instructed him to use Tylenol as needed for pain and inform us if symptoms do not improve in few days. He was tested for COVID today but the results are pending.  2. Bilateral carotid artery stenosis: Status post  left carotid endarterectomy. Followed by Dr. Delana Meyer.  3. moderate aortic stenosis: This was stable on most recent echocardiogram earlier this month.  Repeat echocardiogram in September 2022  4. Persistent atrial fibrillation: He is maintaining in sinus rhythm with amiodarone 200 mg once daily.  He is on anticoagulation with warfarin.  He has metoprolol to be used as needed but has not required this. I reviewed his labs done in November which showed normal TSH and liver enzymes.  5. Hyperlipidemia: Continue high-dose rosuvastatin.  His LDL has been below 70.  6. Anemia: Slight drop on most recent labs to 11.    Disposition:   Follow-up in 3 months   Signed,  Kathlyn Sacramento, MD  06/12/2020 2:35 PM    [facility]

## 2020-06-12 NOTE — Telephone Encounter (Signed)
Received call from transferred from scheduler with patient.  States he woke up this morning with chest pain. Its dull sensation that "goes behind heart through to the back" per patient. Eases up every so often but not going away.  Had headache this morning and took some Tylenol, so headache has eased off but chest pain has not. No BP but his heart rate is 61 at this time (earlier this morning was 88-91).  Patient agreeable to come in to see Dr Fletcher Anon this afternoon. Patient then mentioned he had Nitroglycerin in his pocket. Advised him to take one. He did and after about 5 minutes the pain was a little better but he says he is now sitting and the pain is not as bad when sitting.  Patient advised if he develops new or worsening symptoms, such as SOB, n/v, left arm pain or jaw pain, dizziness, more intense chest pain/pressure, then he should call 911 instead of waiting for the appointment this afternoon. Patient verbalized understanding.

## 2020-06-20 ENCOUNTER — Telehealth: Payer: Self-pay | Admitting: *Deleted

## 2020-06-20 NOTE — Telephone Encounter (Signed)
reviewed chart prior to md apt. Contacted patient. He tested for covid last week. Pt states that he was currently out of his quarantine state.  I asked the patient to change his apt to next week on 2/4 and he is willing. However, he wanted Dr. B to review his labs from [facility] to determine if an fereheme infusion is necessary at this time. He feels ok and is willing to move the apts out further if needed. Dr. Jacinto Reap - Please review and provide feedback and I will call patient back with your recommendations.     Labs at [facility]  Component Ref Range & Units 8 d ago  (06/12/20) 1 mo ago  (05/16/20) 2 mo ago  (04/15/20) 2 mo ago  (04/01/20) 3 mo ago  (03/18/20) 3 mo ago  (03/11/20) 3 mo ago  (03/04/20)  WBC 4.0 - 10.5 K/uL 3.5Low  4.1  3.1Low  3.5Low  3.4Low  3.1Low  2.8Low   RBC 4.22 - 5.81 MIL/uL 3.79Low  3.34Low  3.91Low  3.45Low  3.54Low  3.62Low  3.75Low   Hemoglobin 13.0 - 17.0 g/dL 12.3Low  11.0Low  12.8Low  11.4Low  11.8Low  11.9Low  12.3Low   HCT 39.0 - 52.0 % 39.0  34.4Low  39.0  34.7Low  35.5Low  36.0Low  36.7Low   MCV 80.0 - 100.0 fL 102.9High  103.0High  99.7  100.6High  100.3High  99.4  97.9   MCH 26.0 - 34.0 pg 32.5  32.9  32.7  33.0  33.3  32.9  32.8   MCHC 30.0 - 36.0 g/dL 31.5  32.0  32.8  32.9  33.2  33.1  33.5   RDW 11.5 - 15.5 % 12.7  13.8  14.0  14.3  14.0  14.1  14.6   Platelets 150 - 400 K/uL 157  194  170  174  163  166  166   nRBC 0.0 - 0.2 % 0.0  0.0  0.0  0.0  0.0  0.0  0.0   Neutrophils Relative % % 67  71  69  65  65  62  55   Neutro Abs 1.7 - 7.7 K/uL 2.3  2.9  2.1  2.3  2.3  1.9  1.5Low   Lymphocytes Relative % 15  15  17  20  19  21  28    Lymphs Abs 0.7 - 4.0 K/uL 0.5Low  0.6Low  0.5Low  0.7  0.7  0.7  0.8   Monocytes Relative % 17  10  10  11  12  12  13    Monocytes Absolute 0.1 - 1.0 K/uL 0.6  0.4  0.3  0.4  0.4  0.4  0.4   Eosinophils Relative % 1  3  3  3  3  4  4    Eosinophils Absolute 0.0 -  0.5 K/uL 0.0  0.1  0.1  0.1  0.1  0.1  0.1   Basophils Relative % 0  1  1  1  1  1   0   Basophils Absolute 0.0 - 0.1 K/uL 0.0  0.0  0.0  0.0  0.0  0.0  0.0   Immature Granulocytes % 0  0  0  0  0  0  0   Abs Immature Granulocytes 0.00 - 0.07 K/uL 0.01  0.01 CM  0.00 CM  0.01 CM  0.00 CM  0.00 CM  0.00 CM   Comment: Performed at [facility], [address] [place],  [place] [address]  Resulting Agency  [place] [facility] [place] [facility] [place] [facility] [place] [facility] [place] [facility] [place] [facility] [place] [facility]

## 2020-06-21 ENCOUNTER — Inpatient Hospital Stay: Payer: HMO

## 2020-06-21 ENCOUNTER — Inpatient Hospital Stay: Payer: HMO | Admitting: Internal Medicine

## 2020-06-24 ENCOUNTER — Telehealth: Payer: Self-pay | Admitting: Internal Medicine

## 2020-06-24 NOTE — Telephone Encounter (Signed)
Pt wants clarification on whether he needs to have labs done on 2/4--He stated that he had recent labs with Dr. Fletcher Anon and asks if those could be used.

## 2020-06-24 NOTE — Telephone Encounter (Signed)
md reviewed labs. Labs are currently stable. MD gave v/o that apts may be moved out by a month (patient's preference). I spoke with patient. He is agreeable to the plan. [place]- please move apts out 4-6 weeks. Patient will see apts on mychart.

## 2020-06-26 ENCOUNTER — Other Ambulatory Visit: Payer: Self-pay

## 2020-06-26 ENCOUNTER — Ambulatory Visit (INDEPENDENT_AMBULATORY_CARE_PROVIDER_SITE_OTHER): Payer: HMO

## 2020-06-26 DIAGNOSIS — U071 COVID-19: Secondary | ICD-10-CM | POA: Diagnosis not present

## 2020-06-26 DIAGNOSIS — R058 Other specified cough: Secondary | ICD-10-CM | POA: Diagnosis not present

## 2020-06-26 DIAGNOSIS — Z7901 Long term (current) use of anticoagulants: Secondary | ICD-10-CM | POA: Diagnosis not present

## 2020-06-26 DIAGNOSIS — I4819 Other persistent atrial fibrillation: Secondary | ICD-10-CM

## 2020-06-26 DIAGNOSIS — I4891 Unspecified atrial fibrillation: Secondary | ICD-10-CM

## 2020-06-26 DIAGNOSIS — Z7189 Other specified counseling: Secondary | ICD-10-CM | POA: Diagnosis not present

## 2020-06-26 DIAGNOSIS — J9811 Atelectasis: Secondary | ICD-10-CM | POA: Diagnosis not present

## 2020-06-26 DIAGNOSIS — R059 Cough, unspecified: Secondary | ICD-10-CM | POA: Diagnosis not present

## 2020-06-26 LAB — POCT INR: INR: 2.5 (ref 2.0–3.0)

## 2020-06-26 NOTE — Patient Instructions (Signed)
-    continue warfarin of 1/2 a tablet daily EXCEPT 1 WHOLE TABLET ON MONDAYS & FRIDAYS - Recheck INR in 4 weeks 

## 2020-06-28 ENCOUNTER — Ambulatory Visit: Payer: HMO | Admitting: Internal Medicine

## 2020-06-28 ENCOUNTER — Other Ambulatory Visit: Payer: HMO

## 2020-06-28 ENCOUNTER — Ambulatory Visit: Payer: HMO

## 2020-07-01 DIAGNOSIS — E113593 Type 2 diabetes mellitus with proliferative diabetic retinopathy without macular edema, bilateral: Secondary | ICD-10-CM | POA: Diagnosis not present

## 2020-07-10 ENCOUNTER — Ambulatory Visit: Payer: PPO | Admitting: Physician Assistant

## 2020-07-16 ENCOUNTER — Telehealth: Payer: Self-pay | Admitting: *Deleted

## 2020-07-16 ENCOUNTER — Other Ambulatory Visit: Payer: Self-pay

## 2020-07-16 ENCOUNTER — Inpatient Hospital Stay: Payer: HMO | Attending: Internal Medicine

## 2020-07-16 DIAGNOSIS — D500 Iron deficiency anemia secondary to blood loss (chronic): Secondary | ICD-10-CM | POA: Insufficient documentation

## 2020-07-16 LAB — CBC WITH DIFFERENTIAL/PLATELET
Abs Immature Granulocytes: 0.01 10*3/uL (ref 0.00–0.07)
Basophils Absolute: 0 10*3/uL (ref 0.0–0.1)
Basophils Relative: 1 %
Eosinophils Absolute: 0.1 10*3/uL (ref 0.0–0.5)
Eosinophils Relative: 2 %
HCT: 28.1 % — ABNORMAL LOW (ref 39.0–52.0)
Hemoglobin: 9.1 g/dL — ABNORMAL LOW (ref 13.0–17.0)
Immature Granulocytes: 0 %
Lymphocytes Relative: 16 %
Lymphs Abs: 0.6 10*3/uL — ABNORMAL LOW (ref 0.7–4.0)
MCH: 33.5 pg (ref 26.0–34.0)
MCHC: 32.4 g/dL (ref 30.0–36.0)
MCV: 103.3 fL — ABNORMAL HIGH (ref 80.0–100.0)
Monocytes Absolute: 0.4 10*3/uL (ref 0.1–1.0)
Monocytes Relative: 10 %
Neutro Abs: 2.7 10*3/uL (ref 1.7–7.7)
Neutrophils Relative %: 71 %
Platelets: 164 10*3/uL (ref 150–400)
RBC: 2.72 MIL/uL — ABNORMAL LOW (ref 4.22–5.81)
RDW: 14 % (ref 11.5–15.5)
WBC: 3.9 10*3/uL — ABNORMAL LOW (ref 4.0–10.5)
nRBC: 0 % (ref 0.0–0.2)

## 2020-07-16 LAB — SAMPLE TO BLOOD BANK

## 2020-07-16 NOTE — Telephone Encounter (Signed)
Spoke with patient. He is agreeable to come into the clinic for labs. Cbc/hold tube added.

## 2020-07-16 NOTE — Telephone Encounter (Signed)
Patient called stating that he feels his hgb is low and he is feeling very tired. He is requesting to come in for lab check. Please advise

## 2020-07-16 NOTE — Telephone Encounter (Signed)
Per v/o Dr. Rogue Bussing - scheduled patient for fereheme treatment on 07/18/20 at 1:30 pm. Patient accepted this apt.

## 2020-07-16 NOTE — Telephone Encounter (Signed)
Per Dr. Rogue Bussing  patient needs a lab only apt for lab draw- cbc/ hold tube. Patient contacted and set up for an apt in the clinic today at 2:30 pm for lab only

## 2020-07-18 ENCOUNTER — Inpatient Hospital Stay: Payer: HMO

## 2020-07-18 ENCOUNTER — Ambulatory Visit: Payer: PPO | Admitting: Physician Assistant

## 2020-07-18 VITALS — BP 130/50 | HR 59 | Temp 98.3°F | Resp 16

## 2020-07-18 DIAGNOSIS — D500 Iron deficiency anemia secondary to blood loss (chronic): Secondary | ICD-10-CM

## 2020-07-18 MED ORDER — SODIUM CHLORIDE 0.9 % IV SOLN
510.0000 mg | Freq: Once | INTRAVENOUS | Status: AC
  Administered 2020-07-18: 510 mg via INTRAVENOUS
  Filled 2020-07-18: qty 510

## 2020-07-18 MED ORDER — SODIUM CHLORIDE 0.9 % IV SOLN
Freq: Once | INTRAVENOUS | Status: AC
  Filled 2020-07-18: qty 250

## 2020-07-19 ENCOUNTER — Telehealth: Payer: Self-pay

## 2020-07-19 NOTE — Telephone Encounter (Signed)
Patient calling  Will be going out of town on 03/02 but would really like to have his coumadin checked before he leaves Would like to know if he can get worked in that morning  Nothing available at this time Please advise

## 2020-07-19 NOTE — Telephone Encounter (Signed)
Spoke w/ pt.  He states that he is going out of town on Wednesday, 3/2 and would like to be seen for INR check that am.   Advised pt that I am only in the [place] office on Wednesdays, so I am booked completely on that day. He states that he really wants to come in that morning and wants to be worked in sooner. Explained to him that I am completely booked that morning, even my new pts slots are taken, that we are scheduled to have a staff meeting that day, so I have no free time to work him on that day. Offered him appt on Monday here in the [facility] office. He asked me to make an appt at [facility] at 10:00, but if he doesn't make it, to keep him on for his original appt on 3/2 @ 3:30. Advised him that if I cancel his Wed appt and he does not make the appt on Monday, that I may not be able to switch him back to Wed if someone else takes that slot. He then asks to be left at his original appt in [place] on 3/2 @ 3:30 and he "will try to make it.".

## 2020-07-22 ENCOUNTER — Ambulatory Visit (INDEPENDENT_AMBULATORY_CARE_PROVIDER_SITE_OTHER): Payer: HMO

## 2020-07-22 ENCOUNTER — Other Ambulatory Visit: Payer: Self-pay

## 2020-07-22 ENCOUNTER — Other Ambulatory Visit: Payer: Self-pay | Admitting: Cardiovascular Disease

## 2020-07-22 DIAGNOSIS — I4891 Unspecified atrial fibrillation: Secondary | ICD-10-CM | POA: Diagnosis not present

## 2020-07-22 DIAGNOSIS — I4819 Other persistent atrial fibrillation: Secondary | ICD-10-CM

## 2020-07-22 DIAGNOSIS — Z7901 Long term (current) use of anticoagulants: Secondary | ICD-10-CM

## 2020-07-22 LAB — POCT INR: INR: 2.8 (ref 2.0–3.0)

## 2020-07-22 NOTE — Patient Instructions (Signed)
-    continue warfarin of 1/2 a tablet daily EXCEPT 1 WHOLE TABLET ON MONDAYS & FRIDAYS - Recheck INR in 5 weeks

## 2020-07-26 ENCOUNTER — Ambulatory Visit: Payer: HMO | Admitting: Physician Assistant

## 2020-07-30 ENCOUNTER — Ambulatory Visit (INDEPENDENT_AMBULATORY_CARE_PROVIDER_SITE_OTHER): Payer: HMO

## 2020-07-30 ENCOUNTER — Telehealth: Payer: Self-pay | Admitting: Internal Medicine

## 2020-07-30 DIAGNOSIS — I495 Sick sinus syndrome: Secondary | ICD-10-CM | POA: Diagnosis not present

## 2020-07-30 NOTE — Telephone Encounter (Signed)
Pt is scheduled to be in clinic on 3/12 for lab\md\ferheme. He wants cancel due to him going to see his GI provider on 3/11. He stated they would check his labs and if he needs anything they would take care of it. He is requesting a call back from a clinical team member to tell him if he needs the visit or not.

## 2020-07-30 NOTE — Telephone Encounter (Signed)
I returned the patient. Gi will draw the hgb on Thursday. Pt will keep apt as scheduled at this time on 3/11. He will call our office back on Thursday after the GI apt to determine the plan of care

## 2020-08-01 ENCOUNTER — Other Ambulatory Visit: Payer: Self-pay

## 2020-08-01 ENCOUNTER — Encounter: Payer: Self-pay | Admitting: Internal Medicine

## 2020-08-01 ENCOUNTER — Telehealth: Payer: Self-pay | Admitting: Physician Assistant

## 2020-08-01 ENCOUNTER — Ambulatory Visit: Payer: HMO | Admitting: Gastroenterology

## 2020-08-01 LAB — CUP PACEART REMOTE DEVICE CHECK
Battery Remaining Longevity: 174 mo
Battery Remaining Percentage: 100 %
Brady Statistic RA Percent Paced: 95 %
Brady Statistic RV Percent Paced: 0 %
Date Time Interrogation Session: 20220309140100
Implantable Lead Implant Date: 20210903
Implantable Lead Implant Date: 20210903
Implantable Lead Location: 753859
Implantable Lead Location: 753860
Implantable Lead Model: 7841
Implantable Lead Model: 7842
Implantable Lead Serial Number: 1053222
Implantable Lead Serial Number: 1093158
Implantable Pulse Generator Implant Date: 20210903
Lead Channel Impedance Value: 506 Ohm
Lead Channel Impedance Value: 609 Ohm
Lead Channel Pacing Threshold Amplitude: 0.6 V
Lead Channel Pacing Threshold Amplitude: 1 V
Lead Channel Pacing Threshold Pulse Width: 0.4 ms
Lead Channel Pacing Threshold Pulse Width: 0.4 ms
Lead Channel Setting Pacing Amplitude: 2 V
Lead Channel Setting Pacing Amplitude: 2.5 V
Lead Channel Setting Pacing Pulse Width: 0.4 ms
Lead Channel Setting Sensing Sensitivity: 2 mV
Pulse Gen Serial Number: 945603

## 2020-08-01 NOTE — Telephone Encounter (Signed)
The pt wanted confirm appt for Monday with Amy Esterwood.  Appt is 3/14 at 2 pm.  He will keep appt as planned.  He is also set up for iron infusion and labs on 3/11.  He will call back with any further concerns.

## 2020-08-01 NOTE — Telephone Encounter (Signed)
Contacted patient. His gi apt was moved to Monday. Patient will keep apts as planned tomorrow.

## 2020-08-01 NOTE — Telephone Encounter (Signed)
Inbound call from patient requesting a call back please.  Has questions about upcoming appts he has.

## 2020-08-02 ENCOUNTER — Inpatient Hospital Stay: Payer: HMO | Attending: Internal Medicine

## 2020-08-02 ENCOUNTER — Inpatient Hospital Stay (HOSPITAL_BASED_OUTPATIENT_CLINIC_OR_DEPARTMENT_OTHER): Payer: HMO | Admitting: Internal Medicine

## 2020-08-02 ENCOUNTER — Encounter: Payer: Self-pay | Admitting: Internal Medicine

## 2020-08-02 ENCOUNTER — Inpatient Hospital Stay: Payer: HMO

## 2020-08-02 DIAGNOSIS — I2510 Atherosclerotic heart disease of native coronary artery without angina pectoris: Secondary | ICD-10-CM | POA: Insufficient documentation

## 2020-08-02 DIAGNOSIS — I4891 Unspecified atrial fibrillation: Secondary | ICD-10-CM | POA: Insufficient documentation

## 2020-08-02 DIAGNOSIS — Z8673 Personal history of transient ischemic attack (TIA), and cerebral infarction without residual deficits: Secondary | ICD-10-CM | POA: Insufficient documentation

## 2020-08-02 DIAGNOSIS — I252 Old myocardial infarction: Secondary | ICD-10-CM | POA: Diagnosis not present

## 2020-08-02 DIAGNOSIS — E669 Obesity, unspecified: Secondary | ICD-10-CM | POA: Diagnosis not present

## 2020-08-02 DIAGNOSIS — D509 Iron deficiency anemia, unspecified: Secondary | ICD-10-CM | POA: Insufficient documentation

## 2020-08-02 DIAGNOSIS — Z85828 Personal history of other malignant neoplasm of skin: Secondary | ICD-10-CM | POA: Insufficient documentation

## 2020-08-02 DIAGNOSIS — E119 Type 2 diabetes mellitus without complications: Secondary | ICD-10-CM | POA: Insufficient documentation

## 2020-08-02 DIAGNOSIS — G4730 Sleep apnea, unspecified: Secondary | ICD-10-CM | POA: Insufficient documentation

## 2020-08-02 DIAGNOSIS — Z794 Long term (current) use of insulin: Secondary | ICD-10-CM | POA: Insufficient documentation

## 2020-08-02 DIAGNOSIS — D500 Iron deficiency anemia secondary to blood loss (chronic): Secondary | ICD-10-CM

## 2020-08-02 DIAGNOSIS — R531 Weakness: Secondary | ICD-10-CM | POA: Diagnosis not present

## 2020-08-02 DIAGNOSIS — R5383 Other fatigue: Secondary | ICD-10-CM | POA: Insufficient documentation

## 2020-08-02 DIAGNOSIS — I129 Hypertensive chronic kidney disease with stage 1 through stage 4 chronic kidney disease, or unspecified chronic kidney disease: Secondary | ICD-10-CM | POA: Insufficient documentation

## 2020-08-02 DIAGNOSIS — Z8052 Family history of malignant neoplasm of bladder: Secondary | ICD-10-CM | POA: Insufficient documentation

## 2020-08-02 DIAGNOSIS — Z79899 Other long term (current) drug therapy: Secondary | ICD-10-CM | POA: Diagnosis not present

## 2020-08-02 DIAGNOSIS — N189 Chronic kidney disease, unspecified: Secondary | ICD-10-CM | POA: Insufficient documentation

## 2020-08-02 DIAGNOSIS — Z800 Family history of malignant neoplasm of digestive organs: Secondary | ICD-10-CM | POA: Diagnosis not present

## 2020-08-02 DIAGNOSIS — Z801 Family history of malignant neoplasm of trachea, bronchus and lung: Secondary | ICD-10-CM | POA: Diagnosis not present

## 2020-08-02 LAB — CBC WITH DIFFERENTIAL/PLATELET
Abs Immature Granulocytes: 0 10*3/uL (ref 0.00–0.07)
Basophils Absolute: 0 10*3/uL (ref 0.0–0.1)
Basophils Relative: 1 %
Eosinophils Absolute: 0.1 10*3/uL (ref 0.0–0.5)
Eosinophils Relative: 3 %
HCT: 38.3 % — ABNORMAL LOW (ref 39.0–52.0)
Hemoglobin: 12.2 g/dL — ABNORMAL LOW (ref 13.0–17.0)
Immature Granulocytes: 0 %
Lymphocytes Relative: 18 %
Lymphs Abs: 0.6 10*3/uL — ABNORMAL LOW (ref 0.7–4.0)
MCH: 34 pg (ref 26.0–34.0)
MCHC: 31.9 g/dL (ref 30.0–36.0)
MCV: 106.7 fL — ABNORMAL HIGH (ref 80.0–100.0)
Monocytes Absolute: 0.4 10*3/uL (ref 0.1–1.0)
Monocytes Relative: 11 %
Neutro Abs: 2.2 10*3/uL (ref 1.7–7.7)
Neutrophils Relative %: 67 %
Platelets: 186 10*3/uL (ref 150–400)
RBC: 3.59 MIL/uL — ABNORMAL LOW (ref 4.22–5.81)
RDW: 14.6 % (ref 11.5–15.5)
WBC: 3.3 10*3/uL — ABNORMAL LOW (ref 4.0–10.5)
nRBC: 0 % (ref 0.0–0.2)

## 2020-08-02 LAB — BASIC METABOLIC PANEL
Anion gap: 8 (ref 5–15)
BUN: 21 mg/dL (ref 8–23)
CO2: 24 mmol/L (ref 22–32)
Calcium: 9 mg/dL (ref 8.9–10.3)
Chloride: 104 mmol/L (ref 98–111)
Creatinine, Ser: 1.49 mg/dL — ABNORMAL HIGH (ref 0.61–1.24)
GFR, Estimated: 49 mL/min — ABNORMAL LOW (ref >60)
Glucose, Bld: 252 mg/dL — ABNORMAL HIGH (ref 70–99)
Potassium: 4.8 mmol/L (ref 3.5–5.1)
Sodium: 136 mmol/L (ref 135–145)

## 2020-08-02 NOTE — Progress Notes (Signed)
[place] OFFICE PROGRESS NOTE  Patient Care Team: Rusty Aus, MD as PCP - General (Internal Medicine)  SUMMARY OF ONCOLOGIC HISTORY:  # MARCH 2017- April 2017- IRON DEFICIENCY ANEMIA ? Etiology s/p IV iron [EGD- ?June 2017- Bleeding gastric "polyp"/ colo- Dr.Gessner]; recommend CAPSULE STUDY [on HOLD sec to ?plavix]; September 2021 hemoglobin 6.5 [inpatient-EGD gastric bleeding ulcer s/p APC]; s/p 2 units PRBC;   # hx of A.fib- on coumadin-HOLD April 2017/ CAD [Dr.[name]]; Hx of gastric ulcer [Dr.Gessner];   TIA/ CEA [s/p April 2017]; OSA on CPAP; September 2021 status post permanent pacemaker  History of present illness:   75 -year-old Caucasian male patient is here for follow-up of his severe iron deficiency anemia-;also on Coumadin for A. fib is here for follow-up.  In the interim patient received IV iron infusion approximately 2 to 3 weeks ago.  Notes to improvement of energy levels.  Is awaiting repeat GI evaluation next week.  Denies any blood in stools or black or stools.  Fatigue improved.  Review of Systems  Constitutional: Positive for malaise/fatigue. Negative for chills, diaphoresis and fever.  HENT: Negative for nosebleeds and sore throat.   Eyes: Negative for double vision.  Respiratory: Negative for cough, hemoptysis, sputum production and wheezing.   Cardiovascular: Negative for chest pain, palpitations, orthopnea and leg swelling.  Gastrointestinal: Positive for diarrhea. Negative for abdominal pain, blood in stool, constipation, heartburn, melena, nausea and vomiting.  Genitourinary: Negative for dysuria, frequency and urgency.  Musculoskeletal: Positive for back pain and joint pain.  Skin: Negative.  Negative for itching and rash.  Neurological: Negative for dizziness, tingling, focal weakness, weakness and headaches.  Endo/Heme/Allergies: Does not bruise/bleed easily.  Psychiatric/Behavioral: Negative for depression. The patient is not  nervous/anxious and does not have insomnia.     Past Medical History:  Diagnosis Date  . Basal cell carcinoma of skin 2012   removed several spots from arms and back  . CAD (coronary artery disease)    a. 01/2010 CABG x 4: LIMA->LAD, VG->D1, VG->OM1, VG->PDA. b. NSTEMI 02/2012 in setting of AF-RVR with cath s/p BMS to RCA (plan for 1 month, possibly up to 3 months of Plavix)  . Carotid disease, bilateral ([place])    a. 03/2011 U/S: 40-59% bilat Carotid dzs;  b. 04/2015 Carotid U/S: 40-59% bilat ICA stenosis; c. 05/2015 CTA Neck: 99% RICA, 35% LICA, mod-marked R vertebral stenosis, mod L vertebral stenosis.  . Chronic kidney disease    small obtusion per pcp. ultrasound done on 08/29/2015  . Diabetes mellitus type II, controlled ([place])    a. Variable CBG 02/2012 - several meds adjusted.  Marland Kitchen Dysrhythmia    intermittent Atrial Fibrillation  . GERD (gastroesophageal reflux disease)   . Heart murmur   . Hyperlipidemia   . Hypertension   . Hypertensive heart disease   . Iron deficiency anemia    "gets infusions q once in awhile" (04/27/2018)  . Iron deficiency anemia 08/2015   received 2 units rbc one week ago, 3 IV iron infusion -last 1 week.  . Macular edema bil   lazer work done previously  . Mild aortic stenosis    a. 03/2011 Echo: EF 55-60%, Mild AS. b. Mild by cath 02/2012; c. 01/2014 Echo: EF 65-70%, Gr 1 DD, mild AS, mildly dil LA.  . Multiple gastric ulcers 2006  . Myocardial infarction ([place]) 02/2012   stents (x1) at time  . Myocardial infarction [facility])    "2nd one was after 02/2012; don't know  date" (04/27/2018)  . Obesity   . On home oxygen therapy    "2.5 w/CPAP" (04/27/2018)  . OSA on CPAP    CPAP settings 3 with oxygen 2.5 (04/27/2018)  . PAF (paroxysmal atrial fibrillation) ([place])    a. Newly dx 02/2012 & initiated on Coumadin (spont converted to NSR).  . Shortness of breath dyspnea   . Transfusion history    transfusions -1 month ago -2 units    Past Surgical History:   Procedure Laterality Date  . ANGIOPLASTY    . BASAL CELL CARCINOMA EXCISION     "shoulder, arm X 2"  . CARDIAC CATHETERIZATION  2014  . CARDIAC CATHETERIZATION  2021   [facility] x1 stent  . CATARACT EXTRACTION W/ INTRAOCULAR LENS  IMPLANT, BILATERAL Bilateral 2016  . COLONOSCOPY WITH PROPOFOL N/A 10/15/2015   Procedure: COLONOSCOPY WITH PROPOFOL;  Surgeon: Gatha Mayer, MD;  Location: [facility] ENDOSCOPY;  Service: Endoscopy;  Laterality: N/A;  . CORONARY ANGIOPLASTY     Dr. Fletcher Anon  . CORONARY ARTERY BYPASS GRAFT  2011   CABG X3-4; [place], [place], ,   . CORONARY ATHERECTOMY  04/27/2018  . CORONARY ATHERECTOMY N/A 04/27/2018   Procedure: CORONARY ATHERECTOMY;  Surgeon: Belva Crome, MD;  Location: [place] CV LAB;  Service: Cardiovascular;  Laterality: N/A;  . cyst L kidney  [place]  . ENDARTERECTOMY Left 08/30/2015   Procedure: ENDARTERECTOMY CAROTID;  Surgeon: Katha Cabal, MD;  Location: [facility] ORS;  Service: Vascular;  Laterality: Left;  . ESOPHAGOGASTRODUODENOSCOPY (EGD) WITH PROPOFOL N/A 10/15/2015   Procedure: ESOPHAGOGASTRODUODENOSCOPY (EGD) WITH PROPOFOL;  Surgeon: Gatha Mayer, MD;  Location: [facility] ENDOSCOPY;  Service: Endoscopy;  Laterality: N/A;  . ESOPHAGOGASTRODUODENOSCOPY (EGD) WITH PROPOFOL N/A 01/12/2020   Procedure: ESOPHAGOGASTRODUODENOSCOPY (EGD) WITH PROPOFOL;  Surgeon: Jerene Bears, MD;  Location: [facility] ENDOSCOPY;  Service: Gastroenterology;  Laterality: N/A;  . ESOPHAGOGASTRODUODENOSCOPY (EGD) WITH PROPOFOL N/A 01/15/2020   Procedure: ESOPHAGOGASTRODUODENOSCOPY (EGD) WITH PROPOFOL;  Surgeon: Ladene Artist, MD;  Location: [facility] ENDOSCOPY;  Service: Endoscopy;  Laterality: N/A;  . EYE SURGERY Bilateral    "has macular edema cauterized" (04/27/2018)  . HEMOSTASIS CLIP PLACEMENT  01/15/2020   Procedure: HEMOSTASIS CLIP PLACEMENT;  Surgeon: Ladene Artist, MD;  Location: [place];  Service: Endoscopy;;  . HOT HEMOSTASIS N/A 01/12/2020    Procedure: HOT HEMOSTASIS (ARGON PLASMA COAGULATION/BICAP);  Surgeon: Jerene Bears, MD;  Location: [facility] ENDOSCOPY;  Service: Gastroenterology;  Laterality: N/A;  . HOT HEMOSTASIS N/A 01/15/2020   Procedure: HOT HEMOSTASIS (ARGON PLASMA COAGULATION/BICAP);  Surgeon: Ladene Artist, MD;  Location: [facility] ENDOSCOPY;  Service: Endoscopy;  Laterality: N/A;  . INGUINAL HERNIA REPAIR Right   . LEFT HEART CATHETERIZATION WITH CORONARY ANGIOGRAM N/A 03/09/2012   Procedure: LEFT HEART CATHETERIZATION WITH CORONARY ANGIOGRAM;  Surgeon: Wellington Hampshire, MD;  Location: [place] CATH LAB;  Service: Cardiovascular;  Laterality: N/A;  . PACEMAKER IMPLANT N/A 01/26/2020   Procedure: PACEMAKER IMPLANT;  Surgeon: Vickie Epley, MD;  Location: [place] CV LAB;  Service: Cardiovascular;  Laterality: N/A;  . PERCUTANEOUS CORONARY STENT INTERVENTION (PCI-S)  03/09/2012   Procedure: PERCUTANEOUS CORONARY STENT INTERVENTION (PCI-S);  Surgeon: Wellington Hampshire, MD;  Location: [facility] CATH LAB;  Service: Cardiovascular;;  . POLYPECTOMY  01/15/2020   Procedure: POLYPECTOMY;  Surgeon: Ladene Artist, MD;  Location: [facility] ENDOSCOPY;  Service: Endoscopy;;  . RIGHT/LEFT HEART CATH AND CORONARY ANGIOGRAPHY N/A 04/25/2018   Procedure: RIGHT/LEFT HEART CATH AND  CORONARY ANGIOGRAPHY;  Surgeon: Wellington Hampshire, MD;  Location: [place] CV LAB;  Service: Cardiovascular;  Laterality: N/A;    Family History  Problem Relation Age of Onset  . COPD Mother        alive 53  . Bladder Cancer Mother   . Heart attack Father        09/03/13 deceased  . Lung cancer Maternal Aunt   . Liver cancer Maternal Aunt   . Kidney disease Paternal Uncle   . Cancer Other        all paternal aunts and uncles  . Prostate cancer Neg Hx   . Kidney cancer Neg Hx     SOCIAL HISTORY:   Social History  Substance Use Topics  . Smoking status: Never Smoker   . Smokeless tobacco: Never Used     Comment: tobacco use - no  . Alcohol Use: Yes     Comment:  rare drink    ALLERGIES:  has No Known Allergies.  MEDICATIONS:  Current Outpatient Prescriptions  Medication Sig Dispense Refill  . acetaminophen (TYLENOL) 325 MG tablet Take 650 mg by mouth every 6 (six) hours as needed. For pain    . amiodarone (PACERONE) 200 MG tablet Take 0.5 tablets (100 mg total) by mouth daily. (Patient taking differently: Take 100 mg by mouth at bedtime. ) 45 tablet 3  . amLODipine (NORVASC) 5 MG tablet Take one tablet by mouth one time daily 90 tablet 3  . aspirin 81 MG EC tablet Take 81 mg by mouth daily.      Marland Kitchen atorvastatin (LIPITOR) 20 MG tablet Take 1 tablet (20 mg total) by mouth at bedtime. 90 tablet 3  . furosemide (LASIX) 20 MG tablet Take 1 tablet (20 mg total) by mouth as needed. 30 tablet 6  . glimepiride (AMARYL) 4 MG tablet Take 4 mg by mouth 2 (two) times daily.      . insulin glargine (LANTUS) 100 UNIT/ML injection Inject 20 Units into the skin daily.     . insulin lispro (HUMALOG) 100 UNIT/ML injection Inject 8 Units into the skin 2 (two) times daily with a meal. (Patient taking differently: Inject into the skin 3 (three) times daily. Per sliding scale.)    . metFORMIN (GLUCOPHAGE) 500 MG tablet Take 500 mg by mouth 2 (two) times daily with a meal.    . nitroGLYCERIN (NITROSTAT) 0.4 MG SL tablet Place 1 tablet (0.4 mg total) under the tongue every 5 (five) minutes as needed (up to 3 doses). 25 tablet 1  . OXYGEN Inhale 2.5 L/min into the lungs at bedtime. With CPAP    . pantoprazole (PROTONIX) 40 MG tablet Take 1 tablet (40 mg total) by mouth daily. (Patient taking differently: Take 40 mg by mouth 2 (two) times daily. ) 90 tablet 3  . ramipril (ALTACE) 10 MG capsule Take 1 capsule (10 mg total) by mouth daily. 90 capsule 5  . warfarin (COUMADIN) 5 MG tablet Take as directed by anticoagulation clinic 90 tablet 1  . IRON-VITAMIN C PO Take 1 tablet by mouth daily. Reported on 08/27/2015    . Omega-3 Fatty Acids (FISH OIL) 1200 MG CAPS Take 1 capsule by  mouth daily. Reported on 08/27/2015     No current facility-administered medications for this visit.    PHYSICAL EXAMINATION:   BP 158/71 mmHg  Pulse 51  Temp(Src) 96.1 F (35.6 C)  Resp 18  Wt 192 lb 0.3 oz (87.1 kg)  [employer]  08/27/15 1015  Weight: 192 lb 0.3 oz (87.1 kg)    Physical Exam Constitutional:      Comments: Patient alone.  HENT:     Head: Normocephalic and atraumatic.     Mouth/Throat:     Pharynx: No oropharyngeal exudate.  Eyes:     Pupils: Pupils are equal, round, and reactive to light.  Cardiovascular:     Rate and Rhythm: Normal rate and regular rhythm.  Pulmonary:     Effort: No respiratory distress.     Breath sounds: No wheezing.  Abdominal:     General: Bowel sounds are normal. There is no distension.     Palpations: Abdomen is soft. There is no mass.     Tenderness: There is no abdominal tenderness. There is no guarding or rebound.  Musculoskeletal:        General: No tenderness. Normal range of motion.     Cervical back: Normal range of motion and neck supple.  Skin:    General: Skin is warm.  Neurological:     Mental Status: He is alert and oriented to person, place, and time.  Psychiatric:        Mood and Affect: Affect normal.      LABORATORY DATA:  I have reviewed the data as listed    Component Value Date/Time   NA 139 07/25/2015 1138   NA 139 01/22/2013 0348   K 4.1 07/25/2015 1138   K 4.2 01/22/2013 0348   CL 103 07/25/2015 1138   CL 106 01/22/2013 0348   CO2 27 07/25/2015 1138   CO2 28 01/22/2013 0348   GLUCOSE 216* 07/25/2015 1138   GLUCOSE 158* 01/22/2013 0348   BUN 14 07/25/2015 1138   BUN 16 01/22/2013 0348   CREATININE 1.03 07/25/2015 1138   CREATININE 1.02 01/22/2013 0348   CALCIUM 9.1 07/25/2015 1138   CALCIUM 8.6 01/22/2013 0348   PROT 7.2 06/25/2015 1045   PROT 7.5 01/21/2013 0209   ALBUMIN 4.1 06/25/2015 1045   ALBUMIN 4.3 01/21/2013 0209   AST 30 06/25/2015 1045   AST 28 01/21/2013 0209   ALT  33 06/25/2015 1045   ALT 36 01/21/2013 0209   ALKPHOS 104 06/25/2015 1045   ALKPHOS 143* 01/21/2013 0209   BILITOT 0.4 06/25/2015 1045   BILITOT 0.4 01/21/2013 0209   GFRNONAA >60 07/25/2015 1138   GFRNONAA >60 01/22/2013 0348   GFRAA >60 07/25/2015 1138   GFRAA >60 01/22/2013 0348    No results found for: SPEP, UPEP  Lab Results  Component Value Date   WBC 4.5 08/22/2015   NEUTROABS 2.5 08/22/2015   HGB 8.0 Repeated and verified X2.* 08/22/2015   HCT 26.2 Repeated and verified X2.* 08/22/2015   MCV 67.4 Repeated and verified X2.* 08/22/2015   PLT 264.0 08/22/2015      Chemistry      Component Value Date/Time   NA 139 07/25/2015 1138   NA 139 01/22/2013 0348   K 4.1 07/25/2015 1138   K 4.2 01/22/2013 0348   CL 103 07/25/2015 1138   CL 106 01/22/2013 0348   CO2 27 07/25/2015 1138   CO2 28 01/22/2013 0348   BUN 14 07/25/2015 1138   BUN 16 01/22/2013 0348   CREATININE 1.03 07/25/2015 1138   CREATININE 1.02 01/22/2013 0348      Component Value Date/Time   CALCIUM 9.1 07/25/2015 1138   CALCIUM 8.6 01/22/2013 0348   ALKPHOS 104 06/25/2015 1045   ALKPHOS 143* 01/21/2013 0209   AST  30 06/25/2015 1045   AST 28 01/21/2013 0209   ALT 33 06/25/2015 1045   ALT 36 01/21/2013 0209   BILITOT 0.4 06/25/2015 1045   BILITOT 0.4 01/21/2013 0209        ASSESSMENT & PLAN:   Iron deficiency anemia due to chronic blood loss #Macrocytic anemia-likely multifactorial anemia [iron deficient-likely secondary to gastric losses [gasric ulcer s/p APC] versus others-question cirrhosis.  Less likely bone marrow dysfunction. STABLE.   # Today hemoglobin 12.2; hold iron infusion.  Repeat labs in approximately 6 weeks.  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 STABLE; Monitor for now.  #CAD [aspirin 81mg /day; A. Fib [on coumadin]  # DISPOSITION:  # HOLD Ferrahem today # labs- in 6 weeks- cbc/possible ferrahem # follow up in 3 months- MD;  labs-cbc/bmp/irons studies/ferritin- possible ferrahem -Dr.B

## 2020-08-02 NOTE — Assessment & Plan Note (Addendum)
#  Macrocytic anemia-likely multifactorial anemia [iron deficient-likely secondary to gastric losses [gasric ulcer s/p APC] versus others-question cirrhosis.  Less likely bone marrow dysfunction. STABLE.   # Today hemoglobin 12.2; hold iron infusion.  Repeat labs in approximately 6 weeks.  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 STABLE; Monitor for now.  #CAD [aspirin 81mg /day; A. Fib [on coumadin]  # DISPOSITION:  # HOLD Ferrahem today # labs- in 6 weeks- cbc/possible ferrahem # follow up in 3 months- MD; labs-cbc/bmp/irons studies/ferritin- possible ferrahem -Dr.B

## 2020-08-05 ENCOUNTER — Telehealth: Payer: Self-pay

## 2020-08-05 ENCOUNTER — Encounter: Payer: Self-pay | Admitting: Physician Assistant

## 2020-08-05 ENCOUNTER — Ambulatory Visit (INDEPENDENT_AMBULATORY_CARE_PROVIDER_SITE_OTHER): Payer: HMO | Admitting: Physician Assistant

## 2020-08-05 VITALS — BP 140/78 | HR 66 | Ht 68.0 in | Wt 189.4 lb

## 2020-08-05 DIAGNOSIS — R195 Other fecal abnormalities: Secondary | ICD-10-CM

## 2020-08-05 DIAGNOSIS — Z862 Personal history of diseases of the blood and blood-forming organs and certain disorders involving the immune mechanism: Secondary | ICD-10-CM | POA: Diagnosis not present

## 2020-08-05 DIAGNOSIS — K31819 Angiodysplasia of stomach and duodenum without bleeding: Secondary | ICD-10-CM

## 2020-08-05 NOTE — Patient Instructions (Addendum)
If you are age 75 or older, your body mass index should be between 23-30. Your Body mass index is 28.8 kg/m. If this is out of the aforementioned range listed, please consider follow up with your Primary Care Provider.  If you are age 62 or younger, your body mass index should be between 19-25. Your Body mass index is 28.8 kg/m. If this is out of the aformentioned range listed, please consider follow up with your Primary Care Provider.   You will be contacted by [facility] in the next 2 days to arrange a Abdominal US.  The number on your caller ID will be [phone], please answer when they call.  If you have not heard from them in 2 days please call [phone] to schedule.    You have been scheduled for an endoscopy. Please follow written instructions given to you at your visit today. If you use inhalers (even only as needed), please bring them with you on the day of your procedure.  Your provider has requested that you have labs the week on 08/12/2020. An order has been put in to the system for you to get them done.  Continue your Pantoprazole 40 mg 1 tablet before breakfast.  Continue to take oral iron daily.  Follow up pending the results of your Endoscopy.  Thank you for entrusting me with your care and choosing [facility].  Amy Esterwood, PA-C

## 2020-08-05 NOTE — Telephone Encounter (Signed)
[facility] Pre-operative Risk Assessment     Request for surgical clearance:     Endoscopy Procedure  What type of surgery is being performed?     Endoscopy  When is this surgery scheduled?     09/02/2020  What type of clearance is required ?   Pharmacy  Are there any medications that need to be held prior to surgery and how long? Coumadin starting 5 days prior  Practice name and name of physician performing surgery?      [facility]  What is your office phone and fax number?      Phone- [phone]  [phone]  Anesthesia type (None, local, MAC, general) ?       MAC

## 2020-08-05 NOTE — Progress Notes (Signed)
Subjective:    Patient ID: Seth Hardy, male    DOB: [birth year], 75 y.o.   MRN: [id number]  HPI   [name] is a pleasant 75 year old white male, established with Dr. Carlean Purl who comes in today for follow-up.  He has history of GAVE (gastric antral vascular ectasia-watermelon stomach), and associated iron deficiency anemia.  Also with history of coronary artery disease, prior MI, atrial fibrillation for which he is on Coumadin, carotid disease status post left carotid endarterectomy, adult onset diabetes mellitus, hypertension, and aortic stenosis. He was hospitalized in August 2021 with complaints of severe fatigue and shortness of breath with hemoglobin in the 6.5 range.  He did require transfusions and subsequent IV iron.  He underwent 2 endoscopies during that admission, the second was done on 01/13/2021 per Dr. Hilarie Fredrickson with removal of 2 5 to 8 mm polyps in the stomach which were hyperplastic and had APC of the antrum and prepyloric area for G AVE.Marland Kitchen He is being followed by hematology in [place] and has been having serial hemoglobins there over the past months. He had been doing well with hemoglobin in the 11-12 range, had 1 hemoglobin of 9.1 in mid February.  He did have an iron infusion after that and on 08/02/2020 hemoglobin up to 12.2/hematocrit 38.3/MCV of 106. Most recent iron studies from 05/16/2020 with ferritin 103/iron 112/TIBC 318 and iron sat of 35. With his most recent hemoglobin at 12.2 plan per hematology was to repeat labs in 6 weeks.  He was not felt to need any further IV iron at this time. Patient says that around the time when his hemoglobin drifted to 9 he had been noticing dark stools off and on but no overt melena.  He says his stools tend to stay a bit darker and occasionally will be more obviously dark but not black.  His energy level has been good. Last colonoscopy 2017 was normal. He continues on Protonix 40 mg p.o. every morning, and oral iron once daily.    Review  of Systems Pertinent positive and negative review of systems were noted in the above HPI section.  All other review of systems was otherwise negative.  Outpatient Encounter Medications as of 08/05/2020  Medication Sig  . ALPRAZolam (XANAX) 0.25 MG tablet Take 0.25 mg by mouth daily as needed for anxiety.  Marland Kitchen amiodarone (PACERONE) 200 MG tablet Take 200 mg by mouth daily.  Marland Kitchen amLODipine (NORVASC) 5 MG tablet Take one tablet by mouth one time daily  . aspirin EC 81 MG EC tablet Take 1 tablet (81 mg total) by mouth daily. Swallow whole.  . Cyanocobalamin (B-12) 1000 MCG CAPS Take 1,000 mcg by mouth every evening.   . Ferrous Gluconate-C-Folic Acid (IRON-C PO) Take 1 tablet by mouth daily.  . furosemide (LASIX) 20 MG tablet Take 20 mg by mouth daily as needed for edema.  Marland Kitchen glimepiride (AMARYL) 4 MG tablet Take 4 mg by mouth daily.  . insulin glargine (LANTUS) 100 UNIT/ML injection Inject 15 Units into the skin daily.   . isosorbide mononitrate (IMDUR) 30 MG 24 hr tablet TAKE 1 TABLET BY MOUTH DAILY.  . metoprolol tartrate (LOPRESSOR) 25 MG tablet Take 1 tablet (25 mg) by mouth twice daily as needed for palpitations  . NOVOLOG FLEXPEN 100 UNIT/ML FlexPen Inject 6-36 Units into the skin 2 (two) times daily with a meal.   . Omega-3 Fatty Acids (FISH OIL) 1000 MG CAPS Take 1,000 mg by mouth every evening.  . OXYGEN Inhale 2.5  L/min into the lungs at bedtime. With CPAP  . pantoprazole (PROTONIX) 40 MG tablet Take 1 tablet (40 mg total) by mouth daily.  . Polyethylene Glycol 400 (BLINK TEARS OP) Place 1 drop into both eyes daily as needed (dry eyes).  . ramipril (ALTACE) 10 MG capsule Take 10 mg by mouth daily.   . rosuvastatin (CRESTOR) 40 MG tablet TAKE ONE TABLET BY MOUTH EVERY DAY  . sucralfate (CARAFATE) 1 g tablet Take 1 g by mouth 2 (two) times daily.  . tadalafil (CIALIS) 10 MG tablet Take 10 mg by mouth as needed.  . vitamin C (ASCORBIC ACID) 500 MG tablet Take 500 mg by mouth every evening.  .  warfarin (COUMADIN) 5 MG tablet TAKE ONE TABLET MON AND FRI THEN TAKE ONE-HALF (1/2) TABLET EVERY DAY ALL OTHER DAYS (Patient taking differently: Take 5 mg by mouth one time only at 4 PM. Takes 5 mgs on Monday - Friday and 1/2 tablets Sat and Sunday)   No facility-administered encounter medications on file as of 08/05/2020.   No Known Allergies Patient Active Problem List   Diagnosis Date Noted  . Heme positive stool   . Melena   . GAVE (gastric antral vascular ectasia)   . UGI bleed 01/11/2020  . Atrial fibrillation with RVR ([place])   . Rapid atrial fibrillation ([place]) 12/26/2019  . CAD (coronary artery disease), native coronary artery 04/27/2018  . Effort angina (H[facility])   . Benign essential hypertension 02/11/2016  . Microcytic anemia   . Gastric polyps   . BP (high blood pressure) 09/25/2015  . Aortic heart valve narrowing 09/23/2015  . Carotid artery stenosis with cerebral infarction ([place]) 08/30/2015  . Iron deficiency anemia due to chronic blood loss 08/21/2015  . B12 deficiency 08/19/2015  . Carotid disease, bilateral ([place])   . CAD (coronary artery disease)   . Mild aortic stenosis   . Hypertensive heart disease   . Diabetes mellitus type II, controlled ([place])   . Hyperlipidemia 03/14/2015  . Carotid stenosis, asymptomatic, left 09/13/2014  . 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 ([place]) 30/16/0109  . History of atrial fibrillation 01/04/2014  . Obstructive apnea 01/04/2014  . Atrial fibrillation ([place]) 03/23/2012  . Long term (current) use of anticoagulants 03/14/2012  . Non-STEMI (non-ST elevated myocardial infarction) ([place]) 03/09/2012  . Carotid stenosis 05/28/2010  . CAD, ARTERY BYPASS GRAFT 04/07/2010  . CAD, NATIVE VESSEL 03/13/2009  . Aortic valve stenosis, mild 11/01/2008   Social History   Socioeconomic History  . Marital status: Married    Spouse name: Mary  . Number of children: 2  . Years of education: Not on file  . Highest  education level: Not on file  Occupational History  . Occupation: retired  Tobacco Use  . Smoking status: Never Smoker  . Smokeless tobacco: Never Used  . Tobacco comment: tobacco use - no.no passive smoke in home  Vaping Use  . Vaping Use: Never used  Substance and Sexual Activity  . Alcohol use: Yes    Comment: 04/27/2018 "couple drinks/year; if that"  . Drug use: Never  . Sexual activity: Not Currently  Other Topics Concern  . Not on file  Social History Narrative   Full time. Gets regular exercise. Lives in [place] with his wife.  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   Social Determinants of Radio broadcast assistant Strain: Not on file  Food Insecurity: Not on file  Transportation Needs: Not on file  Physical Activity: Not on  file  Stress: Not on file  Social Connections: Not on file  Intimate Partner Violence: Not on file    Mr. Temples's family history includes Bladder Cancer in his mother; COPD in his mother; Cancer in an other family member; Heart attack in his father; Kidney disease in his paternal uncle; Liver cancer in his maternal aunt; Lung cancer in his maternal aunt.      Objective:    Vitals:   08/05/20 1347  BP: 140/78  Pulse: 66  SpO2: 96%    Physical Exam Well-developed well-nourished  eld WM  in no acute distress.  Height, Weight,189  BMI28.8  HEENT; nontraumatic normocephalic, EOMI, PE R LA, sclera anicteric. Oropharynx;not examined Neck; supple, no JVD Cardiovascular; regular rate and rhythm with C1-U3, + systolic murmur Pulmonary; Clear bilaterally Abdomen; soft, nontender, nondistended, no palpable mass or hepatosplenomegaly, bowel sounds are active Rectal; dark brown heme + stool Skin; benign exam, no jaundice rash or appreciable lesions Extremities; no clubbing cyanosis or edema skin warm and dry Neuro/Psych; alert and oriented x4, grossly nonfocal mood and affect appropriate       Assessment & Plan:   #32 75 year old white  male with multiple comorbidities with history of G AVE syndrome (gastric antral venous ectasia-watermelon stomach) which is associated with chronic GI blood loss and frequently iron deficiency anemia. Patient had undergone EGD with APC of the antrum and prepyloric area August 2021 when he was hospitalized with profound anemia. He has done well over the past 6 to 7 months but did have one dip in hemoglobin in February down to 9.1.  He relates intermittent dark stools and is heme positive on exam today  I suspect he has some persistent G AVE and in the setting of chronic Coumadin he has chronic intermittent blood loss.  #2 history of iron deficiency-followed by hematology/[place] with intermittent IV iron infusions  #3 atrial fibrillation, status post pacemaker 4.  Chronic anticoagulation-on Coumadin, and aspirin 5.  Coronary artery disease status post prior MI 6.  Aortic stenosis 7.  History of carotid disease status post left carotid endarterectomy 8.  Hypertension 9.  Adult onset diabetes mellitus #10 question of early cirrhosis noted on prior CT June 2020  Plan Continue Protonix 40 mg p.o. every morning Continue oral iron 325 mg once daily Plan repeat hemoglobin in 1 week, and continue to follow serially Patient will be scheduled for upper endoscopy with APC at the hospital with Dr. Carlean Purl.  Procedure was discussed in detail with the patient including indications risks and benefits and he is agreeable to proceed.  He will need to come off Coumadin for 5 days prior to procedure, we will communicate with his cardiologist Dr. Fletcher Anon to assure this is reasonable for this patient.  Bridging could be done if felt indicated per cardiology  Schedule upper abdominal ultrasound to re-assess liver.   Breeze Angell Genia Harold PA-C 08/05/2020   Cc: Rusty Aus, MD

## 2020-08-05 NOTE — H&P (View-Only) (Signed)
Subjective:    Patient ID: Seth Hardy, male    DOB: 1946/02/06, 75 y.o.   MRN: [id number]  HPI   Seth Hardy is a pleasant 75 year old white male, established with Dr. Carlean Hardy who comes in today for follow-up.  He has history of GAVE (gastric antral vascular ectasia-watermelon stomach), and associated iron deficiency anemia.  Also with history of coronary artery disease, prior MI, atrial fibrillation for which he is on Coumadin, carotid disease status post left carotid endarterectomy, adult onset diabetes mellitus, hypertension, and aortic stenosis. He was hospitalized in August 2021 with complaints of severe fatigue and shortness of breath with hemoglobin in the 6.5 range.  He did require transfusions and subsequent IV iron.  He underwent 2 endoscopies during that admission, the second was done on 01/13/2021 per Dr. Hilarie Hardy with removal of 2 5 to 8 mm polyps in the stomach which were hyperplastic and had APC of the antrum and prepyloric area for G AVE.Marland Kitchen He is being followed by hematology in [place] and has been having serial hemoglobins there over the past months. He had been doing well with hemoglobin in the 11-12 range, had 1 hemoglobin of 9.1 in mid February.  He did have an iron infusion after that and on 08/02/2020 hemoglobin up to 12.2/hematocrit 38.3/MCV of 106. Most recent iron studies from 05/16/2020 with ferritin 103/iron 112/TIBC 318 and iron sat of 35. With his most recent hemoglobin at 12.2 plan per hematology was to repeat labs in 6 weeks.  He was not felt to need any further IV iron at this time. Patient says that around the time when his hemoglobin drifted to 9 he had been noticing dark stools off and on but no overt melena.  He says his stools tend to stay a bit darker and occasionally will be more obviously dark but not black.  His energy level has been good. Last colonoscopy 2017 was normal. He continues on Protonix 40 mg p.o. every morning, and oral iron once daily.    Review  of Systems Pertinent positive and negative review of systems were noted in the above HPI section.  All other review of systems was otherwise negative.  Outpatient Encounter Medications as of 08/05/2020  Medication Sig  . ALPRAZolam (XANAX) 0.25 MG tablet Take 0.25 mg by mouth daily as needed for anxiety.  Marland Kitchen amiodarone (PACERONE) 200 MG tablet Take 200 mg by mouth daily.  Marland Kitchen amLODipine (NORVASC) 5 MG tablet Take one tablet by mouth one time daily  . aspirin EC 81 MG EC tablet Take 1 tablet (81 mg total) by mouth daily. Swallow whole.  . Cyanocobalamin (B-12) 1000 MCG CAPS Take 1,000 mcg by mouth every evening.   . Ferrous Gluconate-C-Folic Acid (IRON-C PO) Take 1 tablet by mouth daily.  . furosemide (LASIX) 20 MG tablet Take 20 mg by mouth daily as needed for edema.  Marland Kitchen glimepiride (AMARYL) 4 MG tablet Take 4 mg by mouth daily.  . insulin glargine (LANTUS) 100 UNIT/ML injection Inject 15 Units into the skin daily.   . isosorbide mononitrate (IMDUR) 30 MG 24 hr tablet TAKE 1 TABLET BY MOUTH DAILY.  . metoprolol tartrate (LOPRESSOR) 25 MG tablet Take 1 tablet (25 mg) by mouth twice daily as needed for palpitations  . NOVOLOG FLEXPEN 100 UNIT/ML FlexPen Inject 6-36 Units into the skin 2 (two) times daily with a meal.   . Omega-3 Fatty Acids (FISH OIL) 1000 MG CAPS Take 1,000 mg by mouth every evening.  . OXYGEN Inhale 2.5  L/min into the lungs at bedtime. With CPAP  . pantoprazole (PROTONIX) 40 MG tablet Take 1 tablet (40 mg total) by mouth daily.  . Polyethylene Glycol 400 (BLINK TEARS OP) Place 1 drop into both eyes daily as needed (dry eyes).  . ramipril (ALTACE) 10 MG capsule Take 10 mg by mouth daily.   . rosuvastatin (CRESTOR) 40 MG tablet TAKE ONE TABLET BY MOUTH EVERY DAY  . sucralfate (CARAFATE) 1 g tablet Take 1 g by mouth 2 (two) times daily.  . tadalafil (CIALIS) 10 MG tablet Take 10 mg by mouth as needed.  . vitamin C (ASCORBIC ACID) 500 MG tablet Take 500 mg by mouth every evening.  .  warfarin (COUMADIN) 5 MG tablet TAKE ONE TABLET MON AND FRI THEN TAKE ONE-HALF (1/2) TABLET EVERY DAY ALL OTHER DAYS (Patient taking differently: Take 5 mg by mouth one time only at 4 PM. Takes 5 mgs on Monday - Friday and 1/2 tablets Sat and Sunday)   No facility-administered encounter medications on file as of 08/05/2020.   No Known Allergies Patient Active Problem List   Diagnosis Date Noted  . Heme positive stool   . Melena   . GAVE (gastric antral vascular ectasia)   . UGI bleed 01/11/2020  . Atrial fibrillation with RVR ([place])   . Rapid atrial fibrillation ([place]) 12/26/2019  . CAD (coronary artery disease), native coronary artery 04/27/2018  . Effort angina (HCC)   . Benign essential hypertension 02/11/2016  . Microcytic anemia   . Gastric polyps   . BP (high blood pressure) 09/25/2015  . Aortic heart valve narrowing 09/23/2015  . Carotid artery stenosis with cerebral infarction ([place]) 08/30/2015  . Iron deficiency anemia due to chronic blood loss 08/21/2015  . B12 deficiency 08/19/2015  . Carotid disease, bilateral ([place])   . CAD (coronary artery disease)   . Mild aortic stenosis   . Hypertensive heart disease   . Diabetes mellitus type II, controlled ([place])   . Hyperlipidemia 03/14/2015  . Carotid stenosis, asymptomatic, left 09/13/2014  . 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 ([place]) 15/40/0867  . History of atrial fibrillation 01/04/2014  . Obstructive apnea 01/04/2014  . Atrial fibrillation ([place]) 03/23/2012  . Long term (current) use of anticoagulants 03/14/2012  . Non-STEMI (non-ST elevated myocardial infarction) ([place]) 03/09/2012  . Carotid stenosis 05/28/2010  . CAD, ARTERY BYPASS GRAFT 04/07/2010  . CAD, NATIVE VESSEL 03/13/2009  . Aortic valve stenosis, mild 11/01/2008   Social History   Socioeconomic History  . Marital status: Married    Spouse name: Seth Hardy  . Number of children: 2  . Years of education: Not on file  . Highest  education level: Not on file  Occupational History  . Occupation: retired  Tobacco Use  . Smoking status: Never Smoker  . Smokeless tobacco: Never Used  . Tobacco comment: tobacco use - no.no passive smoke in home  Vaping Use  . Vaping Use: Never used  Substance and Sexual Activity  . Alcohol use: Yes    Comment: 04/27/2018 "couple drinks/year; if that"  . Drug use: Never  . Sexual activity: Not Currently  Other Topics Concern  . Not on file  Social History Narrative   Full time. Gets regular exercise. Lives in [place] with his wife.  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   Social Determinants of Radio broadcast assistant Strain: Not on file  Food Insecurity: Not on file  Transportation Needs: Not on file  Physical Activity: Not on  file  Stress: Not on file  Social Connections: Not on file  Intimate Partner Violence: Not on file    Mr. Neddo's family history includes Bladder Cancer in his mother; COPD in his mother; Cancer in an other family member; Heart attack in his father; Kidney disease in his paternal uncle; Liver cancer in his maternal aunt; Lung cancer in his maternal aunt.      Objective:    Vitals:   08/05/20 1347  BP: 140/78  Pulse: 66  SpO2: 96%    Physical Exam Well-developed well-nourished  eld WM  in no acute distress.  Height, Weight,189  BMI28.8  HEENT; nontraumatic normocephalic, EOMI, PE R LA, sclera anicteric. 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 Patient had undergone EGD with APC of the antrum and prepyloric area August 2021 when he was hospitalized with profound anemia. He has done well over the past 6 to 7 months but did have one dip in hemoglobin in February down to 9.1.  He relates intermittent dark stools and is heme positive on exam today  I suspect he has some persistent G AVE and in the setting of chronic Coumadin he has chronic intermittent blood loss.  #2 history of iron deficiency-followed by hematology/[place] with intermittent IV iron infusions  #3 atrial fibrillation, status post pacemaker 4.  Chronic anticoagulation-on Coumadin, and aspirin 5.  Coronary artery disease status post prior MI 6.  Aortic stenosis 7.  History of carotid disease status post left carotid endarterectomy 8.  Hypertension 9.  Adult onset diabetes mellitus #10 question of early cirrhosis noted on prior CT June 2020  Plan Continue Protonix 40 mg p.o. every morning Continue oral iron 325 mg once daily Plan repeat hemoglobin in 1 week, and continue to follow serially Patient will be scheduled for upper endoscopy with APC at the hospital with Dr. Carlean Hardy.  Procedure was discussed in detail with the patient including indications risks and benefits and he is agreeable to proceed.  He will need to come off Coumadin for 5 days prior to procedure, we will communicate with his cardiologist Dr. Fletcher Anon to assure this is reasonable for this patient.  Bridging could be done if felt indicated per cardiology  Schedule upper abdominal ultrasound to re-assess liver.   Keyonna Comunale Genia Harold PA-C 08/05/2020   Cc: Rusty Aus, MD

## 2020-08-05 NOTE — Telephone Encounter (Signed)
Pharmacy please comment on anticoagulation prior to endoscopy.  Kerin Ransom PA-C 08/05/2020 4:06 PM

## 2020-08-05 NOTE — Telephone Encounter (Signed)
Patient with diagnosis of afib on warfarin for anticoagulation.    Procedure: endoscopy Date of procedure: 09/02/20  CHA2DS2-VASc Score = 4  This indicates a 4.8% annual risk of stroke. [name] score is based upon: CHF History: No HTN History: Yes Diabetes History: Yes Stroke History: No Vascular Disease History: Yes Age Score: 1 Gender Score: 0   CrCl 40mL/min Platelet count 186K  Per office protocol, patient can hold warfarin for 5 days prior to procedure.

## 2020-08-05 NOTE — Telephone Encounter (Signed)
   Primary Cardiologist: Kathlyn Sacramento, MD  Chart reviewed and patient contacted today by phone as part of pre-operative protocol coverage. Given past medical history and time since last visit, based on ACC/AHA guidelines, Seth Hardy would be at acceptable risk for the planned procedure without further cardiovascular testing.   OK to hold Coumadin 5 days pre op and resume post op ASAP. The patient understands these instructions.   The patient was advised that if he develops new symptoms prior to surgery to contact our office to arrange for a follow-up visit, and he verbalized understanding.  I will route this recommendation to the requesting party via Epic fax function and remove from pre-op pool.  Please call with questions.  Kerin Ransom, PA-C 08/05/2020, 4:58 PM

## 2020-08-08 NOTE — Progress Notes (Signed)
Remote pacemaker transmission.   

## 2020-08-09 ENCOUNTER — Telehealth: Payer: Self-pay | Admitting: Internal Medicine

## 2020-08-09 NOTE — Telephone Encounter (Signed)
I spoke with the patient. He would just like a lab check on Monday - cbc. He has an upcoming GI procedure on 4/11. He would like to have weekly labs to make sure the hgb does not drop prior to his procedure. He would like to leave the apts as scheduled on 4/22 with fereheme infusion. If his hgb drops next week, he would like to be scheduled an additional iron infusion.  Dr. Rogue Bussing received a message this week from GI regarding the plan of care. Ok to schedule lab only for Monday per md.

## 2020-08-09 NOTE — Telephone Encounter (Signed)
Patient states he saw GI on 3/14 and based on labs they want him to follow up with Dr. B and see if he needs to come sooner than 4/22 for feraheme.  Routing to RN for follow up.

## 2020-08-12 ENCOUNTER — Inpatient Hospital Stay: Payer: HMO

## 2020-08-12 DIAGNOSIS — D509 Iron deficiency anemia, unspecified: Secondary | ICD-10-CM | POA: Diagnosis not present

## 2020-08-12 DIAGNOSIS — D500 Iron deficiency anemia secondary to blood loss (chronic): Secondary | ICD-10-CM

## 2020-08-12 LAB — CBC WITH DIFFERENTIAL/PLATELET
Abs Immature Granulocytes: 0 10*3/uL (ref 0.00–0.07)
Basophils Absolute: 0 10*3/uL (ref 0.0–0.1)
Basophils Relative: 1 %
Eosinophils Absolute: 0.1 10*3/uL (ref 0.0–0.5)
Eosinophils Relative: 2 %
HCT: 36 % — ABNORMAL LOW (ref 39.0–52.0)
Hemoglobin: 11.6 g/dL — ABNORMAL LOW (ref 13.0–17.0)
Immature Granulocytes: 0 %
Lymphocytes Relative: 19 %
Lymphs Abs: 0.6 10*3/uL — ABNORMAL LOW (ref 0.7–4.0)
MCH: 33.2 pg (ref 26.0–34.0)
MCHC: 32.2 g/dL (ref 30.0–36.0)
MCV: 103.2 fL — ABNORMAL HIGH (ref 80.0–100.0)
Monocytes Absolute: 0.4 10*3/uL (ref 0.1–1.0)
Monocytes Relative: 12 %
Neutro Abs: 2.2 10*3/uL (ref 1.7–7.7)
Neutrophils Relative %: 66 %
Platelets: 188 10*3/uL (ref 150–400)
RBC: 3.49 MIL/uL — ABNORMAL LOW (ref 4.22–5.81)
RDW: 13.5 % (ref 11.5–15.5)
WBC: 3.3 10*3/uL — ABNORMAL LOW (ref 4.0–10.5)
nRBC: 0 % (ref 0.0–0.2)

## 2020-08-13 ENCOUNTER — Ambulatory Visit
Admission: RE | Admit: 2020-08-13 | Discharge: 2020-08-13 | Disposition: A | Discharge status: Home / Self Care | Payer: HMO | Source: Ambulatory Visit | Attending: Physician Assistant | Admitting: Physician Assistant

## 2020-08-13 ENCOUNTER — Other Ambulatory Visit: Payer: Self-pay

## 2020-08-13 DIAGNOSIS — D509 Iron deficiency anemia, unspecified: Secondary | ICD-10-CM | POA: Diagnosis not present

## 2020-08-13 DIAGNOSIS — Z862 Personal history of diseases of the blood and blood-forming organs and certain disorders involving the immune mechanism: Secondary | ICD-10-CM | POA: Diagnosis not present

## 2020-08-13 DIAGNOSIS — R195 Other fecal abnormalities: Secondary | ICD-10-CM | POA: Diagnosis not present

## 2020-08-13 DIAGNOSIS — N281 Cyst of kidney, acquired: Secondary | ICD-10-CM | POA: Diagnosis not present

## 2020-08-13 DIAGNOSIS — K31819 Angiodysplasia of stomach and duodenum without bleeding: Secondary | ICD-10-CM | POA: Diagnosis not present

## 2020-08-20 DIAGNOSIS — G4733 Obstructive sleep apnea (adult) (pediatric): Secondary | ICD-10-CM | POA: Diagnosis not present

## 2020-08-28 ENCOUNTER — Ambulatory Visit (INDEPENDENT_AMBULATORY_CARE_PROVIDER_SITE_OTHER): Payer: HMO

## 2020-08-28 ENCOUNTER — Telehealth: Payer: Self-pay | Admitting: Cardiovascular Disease

## 2020-08-28 ENCOUNTER — Other Ambulatory Visit: Payer: Self-pay

## 2020-08-28 ENCOUNTER — Encounter (HOSPITAL_COMMUNITY): Payer: Self-pay

## 2020-08-28 DIAGNOSIS — Z7901 Long term (current) use of anticoagulants: Secondary | ICD-10-CM

## 2020-08-28 DIAGNOSIS — Z5181 Encounter for therapeutic drug level monitoring: Secondary | ICD-10-CM

## 2020-08-28 DIAGNOSIS — I4891 Unspecified atrial fibrillation: Secondary | ICD-10-CM | POA: Diagnosis not present

## 2020-08-28 LAB — POCT INR: INR: 3.3 — AB (ref 2.0–3.0)

## 2020-08-28 NOTE — Telephone Encounter (Signed)
Spoke with the patient. Patient sts that he is currently taking Amiodarone 200 mg daily.  His pcp recently refilled the medication and it was written for 100 mg bid. Patient wants to verify that he is taking the correct dosage.  Adv the patient when he was last seen by Dr. Fletcher Anon it was documented that he was taking Amio 200 mg qd and no changes were made.  Patient would confirm with Dr. Fletcher Anon that he should be taking Amiodarone 200 mg daily. Adv the patient that I will fwd the msg to the MD.

## 2020-08-28 NOTE — Telephone Encounter (Signed)
Patient calling  Patient asked for medication clarification on amiodarone  Transferred to [facility]

## 2020-08-28 NOTE — Patient Instructions (Signed)
-   in preparation for your procedure on 4/11, start holding warfarin today - if ok'd by your MD, please resume warfarin the night of your procedure w/ extra 1/2 tablet for 2 days, then  -  resume dosage of warfarin of 1/2 a tablet daily EXCEPT 1 WHOLE TABLET ON MONDAYS & FRIDAYS - Recheck INR in 2 weeks

## 2020-08-29 ENCOUNTER — Other Ambulatory Visit (HOSPITAL_COMMUNITY): Payer: HMO

## 2020-08-29 NOTE — Telephone Encounter (Signed)
Patient made aware of Dr. Tyrell Antonio response. Patient med list updated. Patient verbalized understanding and voiced appreciation for the call back.

## 2020-08-29 NOTE — Telephone Encounter (Signed)
Amiodarone 200 mg once daily.

## 2020-08-30 DIAGNOSIS — G4733 Obstructive sleep apnea (adult) (pediatric): Secondary | ICD-10-CM | POA: Diagnosis not present

## 2020-09-02 ENCOUNTER — Encounter (HOSPITAL_COMMUNITY)
Admission: RE | Disposition: A | Discharge status: Home / Self Care | Payer: Self-pay | Source: Home / Self Care | Attending: Internal Medicine

## 2020-09-02 ENCOUNTER — Ambulatory Visit (HOSPITAL_COMMUNITY)
Admission: RE | Admit: 2020-09-02 | Discharge: 2020-09-02 | Disposition: A | Discharge status: Home / Self Care | Payer: HMO | Attending: Internal Medicine | Admitting: Internal Medicine

## 2020-09-02 ENCOUNTER — Encounter (HOSPITAL_COMMUNITY): Payer: Self-pay | Admitting: Internal Medicine

## 2020-09-02 ENCOUNTER — Ambulatory Visit (HOSPITAL_COMMUNITY): Payer: HMO | Admitting: Anesthesiology

## 2020-09-02 DIAGNOSIS — Z794 Long term (current) use of insulin: Secondary | ICD-10-CM | POA: Insufficient documentation

## 2020-09-02 DIAGNOSIS — Z801 Family history of malignant neoplasm of trachea, bronchus and lung: Secondary | ICD-10-CM | POA: Diagnosis not present

## 2020-09-02 DIAGNOSIS — I350 Nonrheumatic aortic (valve) stenosis: Secondary | ICD-10-CM | POA: Diagnosis not present

## 2020-09-02 DIAGNOSIS — I10 Essential (primary) hypertension: Secondary | ICD-10-CM | POA: Diagnosis not present

## 2020-09-02 DIAGNOSIS — D500 Iron deficiency anemia secondary to blood loss (chronic): Secondary | ICD-10-CM | POA: Diagnosis not present

## 2020-09-02 DIAGNOSIS — Z79899 Other long term (current) drug therapy: Secondary | ICD-10-CM | POA: Diagnosis not present

## 2020-09-02 DIAGNOSIS — Z841 Family history of disorders of kidney and ureter: Secondary | ICD-10-CM | POA: Insufficient documentation

## 2020-09-02 DIAGNOSIS — K3189 Other diseases of stomach and duodenum: Secondary | ICD-10-CM | POA: Diagnosis not present

## 2020-09-02 DIAGNOSIS — Z951 Presence of aortocoronary bypass graft: Secondary | ICD-10-CM | POA: Insufficient documentation

## 2020-09-02 DIAGNOSIS — K31819 Angiodysplasia of stomach and duodenum without bleeding: Secondary | ICD-10-CM

## 2020-09-02 DIAGNOSIS — K921 Melena: Secondary | ICD-10-CM | POA: Diagnosis not present

## 2020-09-02 DIAGNOSIS — Z7982 Long term (current) use of aspirin: Secondary | ICD-10-CM | POA: Insufficient documentation

## 2020-09-02 DIAGNOSIS — Z950 Presence of cardiac pacemaker: Secondary | ICD-10-CM | POA: Insufficient documentation

## 2020-09-02 DIAGNOSIS — Z8249 Family history of ischemic heart disease and other diseases of the circulatory system: Secondary | ICD-10-CM | POA: Insufficient documentation

## 2020-09-02 DIAGNOSIS — Z809 Family history of malignant neoplasm, unspecified: Secondary | ICD-10-CM | POA: Insufficient documentation

## 2020-09-02 DIAGNOSIS — I779 Disorder of arteries and arterioles, unspecified: Secondary | ICD-10-CM | POA: Insufficient documentation

## 2020-09-02 DIAGNOSIS — I252 Old myocardial infarction: Secondary | ICD-10-CM | POA: Insufficient documentation

## 2020-09-02 DIAGNOSIS — I2510 Atherosclerotic heart disease of native coronary artery without angina pectoris: Secondary | ICD-10-CM | POA: Insufficient documentation

## 2020-09-02 DIAGNOSIS — E119 Type 2 diabetes mellitus without complications: Secondary | ICD-10-CM | POA: Insufficient documentation

## 2020-09-02 DIAGNOSIS — Z803 Family history of malignant neoplasm of breast: Secondary | ICD-10-CM | POA: Diagnosis not present

## 2020-09-02 DIAGNOSIS — Z800 Family history of malignant neoplasm of digestive organs: Secondary | ICD-10-CM | POA: Insufficient documentation

## 2020-09-02 DIAGNOSIS — Z825 Family history of asthma and other chronic lower respiratory diseases: Secondary | ICD-10-CM | POA: Diagnosis not present

## 2020-09-02 DIAGNOSIS — Z862 Personal history of diseases of the blood and blood-forming organs and certain disorders involving the immune mechanism: Secondary | ICD-10-CM

## 2020-09-02 DIAGNOSIS — Z7901 Long term (current) use of anticoagulants: Secondary | ICD-10-CM | POA: Diagnosis not present

## 2020-09-02 DIAGNOSIS — I4891 Unspecified atrial fibrillation: Secondary | ICD-10-CM | POA: Diagnosis not present

## 2020-09-02 DIAGNOSIS — Z8719 Personal history of other diseases of the digestive system: Secondary | ICD-10-CM | POA: Diagnosis not present

## 2020-09-02 DIAGNOSIS — K31811 Angiodysplasia of stomach and duodenum with bleeding: Secondary | ICD-10-CM | POA: Diagnosis not present

## 2020-09-02 DIAGNOSIS — Z8711 Personal history of peptic ulcer disease: Secondary | ICD-10-CM | POA: Insufficient documentation

## 2020-09-02 DIAGNOSIS — Z955 Presence of coronary angioplasty implant and graft: Secondary | ICD-10-CM | POA: Insufficient documentation

## 2020-09-02 DIAGNOSIS — Z09 Encounter for follow-up examination after completed treatment for conditions other than malignant neoplasm: Secondary | ICD-10-CM | POA: Insufficient documentation

## 2020-09-02 DIAGNOSIS — K317 Polyp of stomach and duodenum: Secondary | ICD-10-CM | POA: Diagnosis not present

## 2020-09-02 HISTORY — PX: GI RADIOFREQUENCY ABLATION: SHX6807

## 2020-09-02 HISTORY — DX: Presence of cardiac pacemaker: Z95.0

## 2020-09-02 HISTORY — PX: BIOPSY: SHX5522

## 2020-09-02 HISTORY — PX: ESOPHAGOGASTRODUODENOSCOPY (EGD) WITH PROPOFOL: SHX5813

## 2020-09-02 LAB — GLUCOSE, CAPILLARY: Glucose-Capillary: 171 mg/dL — ABNORMAL HIGH (ref 70–99)

## 2020-09-02 SURGERY — ESOPHAGOGASTRODUODENOSCOPY (EGD) WITH PROPOFOL
Anesthesia: Monitor Anesthesia Care

## 2020-09-02 MED ORDER — PROPOFOL 10 MG/ML IV BOLUS
INTRAVENOUS | Status: DC | PRN
  Administered 2020-09-02: 10 mg via INTRAVENOUS
  Administered 2020-09-02: 20 mg via INTRAVENOUS
  Administered 2020-09-02: 10 mg via INTRAVENOUS

## 2020-09-02 MED ORDER — SODIUM CHLORIDE 0.9 % IV SOLN: INTRAVENOUS | Status: DC

## 2020-09-02 MED ORDER — PROPOFOL 500 MG/50ML IV EMUL
INTRAVENOUS | Status: AC
  Filled 2020-09-02: qty 50

## 2020-09-02 MED ORDER — LACTATED RINGERS IV SOLN: INTRAVENOUS | Status: DC

## 2020-09-02 MED ORDER — PROPOFOL 500 MG/50ML IV EMUL
INTRAVENOUS | Status: DC | PRN
  Administered 2020-09-02: 125 ug/kg/min via INTRAVENOUS

## 2020-09-02 SURGICAL SUPPLY — 15 items

## 2020-09-02 NOTE — Op Note (Signed)
[facility] Patient Name: Seth Hardy Procedure Date: 09/02/2020 MRN: [id number] Attending MD: Gatha Mayer , MD Date of Birth: 1945-10-06 CSN: [id number] Age: 75 Admit Type: Outpatient Procedure:                Upper GI endoscopy Indications:              Iron deficiency anemia secondary to chronic blood                            loss, Melena, Gastric polyps, Follow-up of gastric                            polyps, Watermelon stomach (GAVE syndrome) Providers:                Gatha Mayer, MD, Erenest Rasher, RN, Elspeth Cho Tech., Technician, Karis Juba, CRNA Referring MD:             Nicoletta Ba PA, PA, Rusty Aus, MD Medicines:                Propofol per Anesthesia, Monitored Anesthesia Care Complications:            No immediate complications. Estimated Blood Loss:     Estimated blood loss was minimal. Procedure:                Pre-Anesthesia Assessment:                           - Prior to the procedure, a History and Physical                            was performed, and patient medications and                            allergies were reviewed. [name] tolerance of                            previous anesthesia was also reviewed. The risks                            and benefits of the procedure and the sedation                            options and risks were discussed with the patient.                            All questions were answered, and informed consent                            was obtained. Prior Anticoagulants: The patient                            last took Coumadin (warfarin) 5 days prior to the  procedure. ASA Grade Assessment: III - A patient                            with severe systemic disease. After reviewing the                            risks and benefits, the patient was deemed in                            satisfactory condition to undergo the  procedure.                           After obtaining informed consent, the endoscope was                            passed under direct vision. Throughout the                            procedure, the patient's blood pressure, pulse, and                            oxygen saturations were monitored continuously. The                            [id number]-H190 ([id number]) was introduced through the                            mouth, and advanced to the second part of duodenum.                            The upper GI endoscopy was accomplished without                            difficulty. The patient tolerated the procedure                            well. 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 Focal radiofrequency ablation of       gastric antral vascular ectasia in the stomach was performed. With the       endoscope in place, the position and extent of the abnormal mucosa and       appropriate anatomic landmarks were noted. The radiofrequency channel       ablation catheter was introduced through the endoscope working channel.       The endoscope with the ablation catheter was advanced to the areas of       abnormal mucosa. The endoscope with the channel ablation catheter was       positioned under direct visualization so that the catheter was placed in       contact with the surface of the abnormal mucosa. Energy was applied. The       ablation catheter was removed through the endoscope working channel. The       areas  where abnormal mucosa had been ablated were examined. Biopsies       were taken with a cold forceps for histology. Verification of patient       identification for the specimen was done. Estimated blood loss was       minimal.      The exam was otherwise without abnormality.      The cardia and gastric fundus were normal on retroflexion. Impression:               - Gastric  antral vascular ectasia with bleeding.                            Treated with radiofrequency ablation. Biopsied in a                            few areas to see if any changes of portal                            hypertension. Some of the tissues were friable and                            irregular ? from prior APC vs other                           - The examination was otherwise normal. Moderate Sedation:      Not Applicable - Patient had care per Anesthesia. Recommendation:           - Patient has a contact number available for                            emergencies. The signs and symptoms of potential                            delayed complications were discussed with the                            patient. Return to normal activities tomorrow.                            Written discharge instructions were provided to the                            patient.                           - Resume previous diet.                           - Continue present medications.                           - Resume Coumadin (warfarin) at prior dose today.                            Refer to Coumadin Clinic instructions for further  adjustment of therapy. Procedure Code(s):        --- Professional ---                           [phone], Esophagogastroduodenoscopy, flexible,                            transoral; with ablation of tumor(s), polyp(s), or                            other lesion(s) (includes pre- and post-dilation                            and guide wire passage, when performed) Diagnosis Code(s):        --- Professional ---                           K31.811, Angiodysplasia of stomach and duodenum                            with bleeding                           D50.0, Iron deficiency anemia secondary to blood                            loss (chronic)                           K92.1, Melena (includes Hematochezia)                           K31.7, Polyp of stomach  and duodenum CPT copyright 2019 [employer]. All rights reserved. The codes documented in this report are preliminary and upon coder review may  be revised to meet current compliance requirements. Gatha Mayer, MD 09/02/2020 12:06:37 PM This report has been signed electronically. Number of Addenda: 0

## 2020-09-02 NOTE — Interval H&P Note (Signed)
History and Physical Interval Note:  09/02/2020 11:20 AM  Seth Hardy  has presented today for surgery, with the diagnosis of GAVE, Hx FE def anemia.  The various methods of treatment have been discussed with the patient and family. After consideration of risks, benefits and other options for treatment, the patient has consented to  Procedure(s) with comments: ESOPHAGOGASTRODUODENOSCOPY (EGD) WITH PROPOFOL (N/A) - with APC as a surgical intervention.  [name] history has been reviewed, patient examined, no change in status, stable for surgery.  I have reviewed the patient's chart and labs.  Questions were answered to the patient's satisfaction.     Silvano Rusk

## 2020-09-02 NOTE — Anesthesia Postprocedure Evaluation (Signed)
Anesthesia Post Note  Patient: Seth Hardy  Procedure(s) Performed: ESOPHAGOGASTRODUODENOSCOPY (EGD) WITH PROPOFOL (N/A ) GI RADIOFREQUENCY ABLATION BIOPSY     Patient location during evaluation: PACU Anesthesia Type: MAC Level of consciousness: awake Pain management: pain level controlled Vital Signs Assessment: post-procedure vital signs reviewed and stable Respiratory status: spontaneous breathing Cardiovascular status: stable Postop Assessment: no apparent nausea or vomiting Anesthetic complications: no   No complications documented.  Last Vitals:  Vitals:   09/02/20 1210 09/02/20 1220  BP: (!) 127/44 (!) 147/60  Pulse: 60 (!) 59  Resp: 15 15  Temp:    SpO2: 97% 96%    Last Pain:  Vitals:   09/02/20 1220  TempSrc:   PainSc: 0-No pain                 [name]

## 2020-09-02 NOTE — Transfer of Care (Signed)
Immediate Anesthesia Transfer of Care Note  Patient: Seth Hardy  Procedure(s) Performed: ESOPHAGOGASTRODUODENOSCOPY (EGD) WITH PROPOFOL (N/A ) GI RADIOFREQUENCY ABLATION BIOPSY  Patient Location: PACU and Endoscopy Unit  Anesthesia Type:MAC  Level of Consciousness: oriented, drowsy and patient cooperative  Airway & Oxygen Therapy: Patient Spontanous Breathing and Patient connected to face mask oxygen  Post-op Assessment: Report given to RN and Post -op Vital signs reviewed and stable  Post vital signs: Reviewed and stable  Last Vitals:  Vitals Value Taken Time  BP 126/58 09/02/20 1200  Temp    Pulse 60 09/02/20 1200  Resp 14 09/02/20 1200  SpO2 100 % 09/02/20 1200  Vitals shown include unvalidated device data.  Last Pain:  Vitals:   09/02/20 1037  TempSrc: Oral  PainSc: 0-No pain         Complications: No complications documented.

## 2020-09-02 NOTE — Discharge Instructions (Signed)
I ablated or cauterized the tissue again today. I did not see any polyps. I took some biopsies to try to understand things better. I do not suspect cancer.  I will contact you with results and plans.  Resume warfarin today as recommended by anti-coagulation clinic.   I appreciate the opportunity to care for you. Gatha Mayer, MD, FACG  YOU HAD AN ENDOSCOPIC PROCEDURE TODAY: Refer to the procedure report and other information in the discharge instructions given to you for any specific questions about what was found during the examination. If this information does not answer your questions, please call Dr. Celesta Aver office at [phone] to clarify.   YOU SHOULD EXPECT: Some feelings of bloating in the abdomen. Passage of more gas than usual. Walking can help get rid of the air that was put into your GI tract during the procedure and reduce the bloating. If you had a lower endoscopy (such as a colonoscopy or flexible sigmoidoscopy) you may notice spotting of blood in your stool or on the toilet paper. Some abdominal soreness may be present for a day or two, also.  DIET: Your first meal following the procedure should be a light meal and then it is ok to progress to your normal diet. A half-sandwich or bowl of soup is an example of a good first meal. Heavy or fried foods are harder to digest and may make you feel nauseous or bloated. Drink plenty of fluids but you should avoid alcoholic beverages for 24 hours.   ACTIVITY: Your care partner should take you home directly after the procedure. You should plan to take it easy, moving slowly for the rest of the day. You can resume normal activity the day after the procedure however YOU SHOULD NOT DRIVE, use power tools, machinery or perform tasks that involve climbing or major physical exertion for 24 hours (because of the sedation medicines used during the test).   SYMPTOMS TO REPORT IMMEDIATELY: A gastroenterologist can be reached at any hour. Please  call [phone]  for any of the following symptoms:   Following upper endoscopy (EGD, EUS, ERCP, esophageal dilation) Vomiting of blood or coffee ground material  New, significant abdominal pain  New, significant chest pain or pain under the shoulder blades  Painful or persistently difficult swallowing  New shortness of breath  Black, tarry-looking or red, bloody stools

## 2020-09-02 NOTE — Anesthesia Preprocedure Evaluation (Addendum)
Anesthesia Evaluation  Patient identified by MRN, date of birth, ID band Patient awake    Reviewed: Allergy & Precautions, NPO status , Patient's Chart, lab work & pertinent test results, reviewed documented beta blocker date and time   Airway Mallampati: I       Dental  (+) Caps, Chipped   Pulmonary    Pulmonary exam normal        Cardiovascular hypertension, Pt. on medications and Pt. on home beta blockers + CAD, + Cardiac Stents and + CABG  Normal cardiovascular exam+ dysrhythmias + pacemaker + Valvular Problems/Murmurs AS  Rhythm:Regular Rate:Normal + Systolic murmurs    Neuro/Psych    GI/Hepatic PUD, GERD  Medicated,  Endo/Other  diabetes, Type 2, Oral Hypoglycemic Agents, Insulin Dependent  Renal/GU      Musculoskeletal   Abdominal Normal abdominal exam  (+)   Peds  Hematology   Anesthesia Other Findings interrogation reviewed. Stable lead parameters and good battery longevity. Left ventricular ejection fraction, by estimation, is 60 to 65%. The left ventricle has normal function. Left ventricular endocardial border not optimally defined to evaluate regional wall motion. There is moderate left ventricular hypertrophy, with severe focal basal hypertrophy of the septum. Left ventricular diastolic parameters are consistent with Grade II diastolic dysfunction (pseudonormalization). Elevated left atrial pressure. 2. Right ventricular systolic function is mildly reduced. The right ventricular size is normal. Tricuspid regurgitation signal is inadequate for assessing PA pressure. 3. Left atrial size was mildly dilated. 4. The mitral valve is grossly normal. No evidence of mitral valve regurgitation. No evidence of mitral stenosis. 5. The aortic valve is tricuspid. Aortic valve regurgitation is not visualized. Mild to moderate aortic valve stenosis, though degree of stenosis may be underestimated due to poor  Doppler envelopes  Reproductive/Obstetrics                            Anesthesia Physical Anesthesia Plan  ASA: III  Anesthesia Plan: MAC   Post-op Pain Management:    Induction:   PONV Risk Score and Plan: Propofol infusion  Airway Management Planned: Natural Airway and Mask  Additional Equipment: None  Intra-op Plan:   Post-operative Plan:   Informed Consent: I have reviewed the patients History and Physical, chart, labs and discussed the procedure including the risks, benefits and alternatives for the proposed anesthesia with the patient or authorized representative who has indicated his/her understanding and acceptance.     Dental advisory given  Plan Discussed with: CRNA  Anesthesia Plan Comments:         Anesthesia Quick Evaluation                                  Anesthesia Evaluation  Patient identified by MRN, date of birth, ID band Patient awake    Reviewed: Allergy & Precautions, NPO status , Patient's Chart, lab work & pertinent test results, reviewed documented beta blocker date and time   Airway Mallampati: II  TM Distance: >3 FB Neck ROM: Full    Dental  (+) Caps, Chipped   Pulmonary shortness of breath, with exertion, at rest and Long-Term Oxygen Therapy, sleep apnea and Continuous Positive Airway Pressure Ventilation ,    breath sounds clear to auscultation (-) decreased breath sounds      Cardiovascular hypertension, Pt. on medications and Pt. on home beta blockers + angina with exertion + CAD, + Past MI, +  Cardiac Stents and + CABG  + dysrhythmias Atrial Fibrillation + Valvular Problems/Murmurs AS  Rhythm:Irregular Rate:Normal + Systolic murmurs    Neuro/Psych negative neurological ROS  negative psych ROS   GI/Hepatic Neg liver ROS, PUD, GERD  Medicated and Controlled,Heme + stools   Endo/Other  diabetes, Well Controlled, Type 2, Oral Hypoglycemic Agents, Insulin DependentHyperlipidemia   Renal/GU Renal InsufficiencyRenal disease   ED    Musculoskeletal negative musculoskeletal ROS (+)   Abdominal   Peds  Hematology  (+) anemia , IDA Plavix -last dose 8/19 Coumadin- last dose 8/17   Anesthesia Other Findings   Reproductive/Obstetrics                             Anesthesia Physical  Anesthesia Plan  ASA: III  Anesthesia Plan: MAC   Post-op Pain Management:    Induction: Intravenous  PONV Risk Score and Plan: 1 and Propofol infusion and Treatment may vary due to age or medical condition  Airway Management Planned: Natural Airway, Nasal Cannula and Simple Face Mask  Additional Equipment:   Intra-op Plan:   Post-operative Plan:   Informed Consent: I have reviewed the patients History and Physical, chart, labs and discussed the procedure including the risks, benefits and alternatives for the proposed anesthesia with the patient or authorized representative who has indicated his/her understanding and acceptance.     Dental advisory given  Plan Discussed with: CRNA and Anesthesiologist  Anesthesia Plan Comments:         Anesthesia Quick Evaluation

## 2020-09-03 ENCOUNTER — Telehealth: Payer: Self-pay | Admitting: Physician Assistant

## 2020-09-03 LAB — SURGICAL PATHOLOGY

## 2020-09-03 NOTE — Telephone Encounter (Signed)
Spoke with patient, he had spoke Dr. Posey Pronto last night and was told that it seem that the Pantoprazole is not working well for him and that he should be switched to Dexilant 60 mg.

## 2020-09-04 ENCOUNTER — Encounter (HOSPITAL_COMMUNITY): Payer: Self-pay | Admitting: Internal Medicine

## 2020-09-07 NOTE — Telephone Encounter (Signed)
Please tell the patient stomach biopsies showed mild inflammation - I was checking to see if there was a possible other problem related to the liver and the answer is no (good news).  Also - I am not sure what/why is being asked here.  Is he having reflux or stomach sxs ? If so we can change PPI but acid blocking meds will not fix what is wrong with stomach (GAVE)  I do want him to repeat an EGD in June or July at hospital to recheck this and see if he needs more treatment  He will need to hold warfarin like we did this time

## 2020-09-09 ENCOUNTER — Other Ambulatory Visit: Payer: Self-pay

## 2020-09-09 DIAGNOSIS — K31819 Angiodysplasia of stomach and duodenum without bleeding: Secondary | ICD-10-CM

## 2020-09-09 NOTE — Telephone Encounter (Signed)
Patient notified of the recommendations.  EGD with possible APC scheduled for 11/21/20 at [facility].  I  Mailed him instructions. He will call if he has any questions once he reviews them in the mail and on MyChart.  He is aware he will be required to COVID screen on 6/27.  Instructions also given for Coumadin hold for 5 days prior to the procedure.

## 2020-09-10 ENCOUNTER — Ambulatory Visit (INDEPENDENT_AMBULATORY_CARE_PROVIDER_SITE_OTHER): Payer: HMO | Admitting: Cardiovascular Disease

## 2020-09-10 ENCOUNTER — Encounter: Payer: Self-pay | Admitting: Cardiovascular Disease

## 2020-09-10 ENCOUNTER — Other Ambulatory Visit: Payer: Self-pay

## 2020-09-10 ENCOUNTER — Ambulatory Visit: Payer: Self-pay | Admitting: *Deleted

## 2020-09-10 VITALS — BP 138/60 | HR 61 | Ht 68.0 in | Wt 188.0 lb

## 2020-09-10 DIAGNOSIS — I25810 Atherosclerosis of coronary artery bypass graft(s) without angina pectoris: Secondary | ICD-10-CM | POA: Diagnosis not present

## 2020-09-10 DIAGNOSIS — I350 Nonrheumatic aortic (valve) stenosis: Secondary | ICD-10-CM | POA: Diagnosis not present

## 2020-09-10 DIAGNOSIS — I4819 Other persistent atrial fibrillation: Secondary | ICD-10-CM | POA: Diagnosis not present

## 2020-09-10 DIAGNOSIS — I6523 Occlusion and stenosis of bilateral carotid arteries: Secondary | ICD-10-CM | POA: Diagnosis not present

## 2020-09-10 DIAGNOSIS — D649 Anemia, unspecified: Secondary | ICD-10-CM | POA: Diagnosis not present

## 2020-09-10 DIAGNOSIS — E785 Hyperlipidemia, unspecified: Secondary | ICD-10-CM

## 2020-09-10 MED ORDER — ELIQUIS 5 MG PO TABS: 5.0000 mg | ORAL_TABLET | Freq: Two times a day (BID) | ORAL | 0 refills | Status: AC

## 2020-09-10 NOTE — Progress Notes (Signed)
Cardiology Office Note   Date:  09/10/2020   ID:  Seth Hardy, DOB Jul 24, 1945, MRN [id number]  PCP:  Seth Aus, MD  Cardiologist:   Kathlyn Sacramento, MD   Chief Complaint  Patient presents with  . Other    3 month f/u pt would like to discuss alternative for Warfarin. Meds reviewed verbally with pt.      History of Present Illness: [name] is a 75 y.o. male who Is here today for a follow-up visit regarding multiple cardiac issues. He has a history of coronary artery disease and is status CABG in September 2011 with subsequent bare metal stenting of the right coronary artery in 2013. He also has a history of persistent atrial fibrillation maintained in sinus rhythm on small dose amiodarone and is chronically anticoagulated on Coumadin. He has other chronic medical conditions that include aortic stenosis, sleep apnea on CPAP hypertension, hyperlipidemia, bilateral carotid arterial disease status post left carotid endarterectomy in April 2017, and diabetes.  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  He was seen in November of 2019 for worsening angina. A right and left cardiac catheterization was performed in December 20 19 which showed significant underlying three-vessel coronary artery disease with patent LIMA to LAD and SVG to OM with known chronically occluded SVG to RCA.  Proximal RCA stent was patent with only mild in-stent restenosis.  However, he was found to have severe calcified stenosis of the distal RCA.  Right heart catheterization showed mildly elevated filling pressures with normal cardiac output.  Aortic stenosis was moderate with a mean gradient of 20 mmHg. The patient underwent staged orbital atherectomy of the distal right coronary artery with drug-eluting stent placement.   The patient was hospitalized at [facility] in [place] in April of 2020 after he presented with A.  fib with RVR and non-STEMI.    He underwent left heart catheterization which showed significant restenosis in the distal RCA stent.  This was treated with PCI and adding another drug-eluting stent.   He was hospitalized at [facility] in August, 2021 with A. fib with RVR.  He converted sinus rhythm with IV amiodarone.  He was previously on amiodarone 100 mg once daily and this was increased to 200 mg twice daily for 1 week then down to 200 mg once daily.  His troponin was mildly elevated felt to be due to supply demand ischemia.  Echocardiogram was repeated which showed an EF of 60 to 27%, grade 2 diastolic dysfunction and stable moderate aortic stenosis.  The patient is noted to have iron deficiency anemia currently managed by hematology.  He was readmitted to the hospital a second time in August for symptomatic anemia. Treatment of atrial fibrillation was difficult due to underlying bradycardia and ultimately underwent permanent pacemaker placement by Dr. Quentin Ore in September of 2021.   He continues to have issues with blood loss anemia due to gastric antral vascular ectasia.  He wonders if there is an alternative to warfarin.  I think the safest option for him would be Eliquis.  Otherwise he has been doing well with no recent chest pain or shortness of breath.    Past Medical History:  Diagnosis Date  . Basal cell carcinoma of skin 2012   removed several spots from arms and back  . CAD (coronary artery disease)    a. 01/2010 CABG x 4: LIMA->LAD, VG->D1, VG->OM1, VG->PDA. b.  NSTEMI 02/2012 in setting of AF-RVR with cath s/p BMS to RCA (plan for 1 month, possibly up to 3 months of Plavix)  . Carotid disease, bilateral ([place])    a. 03/2011 U/S: 40-59% bilat Carotid dzs;  b. 04/2015 Carotid U/S: 40-59% bilat ICA stenosis; c. 05/2015 CTA Neck: 84% RICA, 16% LICA, mod-marked R vertebral stenosis, mod L vertebral stenosis.  . Chronic kidney disease    small obtusion per pcp. ultrasound done on 08/29/2015  .  Diabetes mellitus type II, controlled ([place])    a. Variable CBG 02/2012 - several meds adjusted.  Marland Kitchen Dysrhythmia    intermittent Atrial Fibrillation  . GERD (gastroesophageal reflux disease)   . Heart murmur   . Hyperlipidemia   . Hypertension   . Hypertensive heart disease   . Iron deficiency anemia    "gets infusions q once in awhile" (04/27/2018)  . Iron deficiency anemia 08/2015   received 2 units rbc one week ago, 3 IV iron infusion -last 1 week.  . Macular edema bil   lazer work done previously  . Mild aortic stenosis    a. 03/2011 Echo: EF 55-60%, Mild AS. b. Mild by cath 02/2012; c. 01/2014 Echo: EF 65-70%, Gr 1 DD, mild AS, mildly dil LA.  . Multiple gastric ulcers 2006  . Myocardial infarction ([place]) 02/2012   stents (x1) at time  . Myocardial infarction [facility])    "2nd one was after 02/2012; don't know date" (04/27/2018)  . Obesity   . On home oxygen therapy    "2.5 w/CPAP" (04/27/2018)  . OSA on CPAP    CPAP settings 3 with oxygen 2.5 (04/27/2018)  . PAF (paroxysmal atrial fibrillation) ([place])    a. Newly dx 02/2012 & initiated on Coumadin (spont converted to NSR).  . Presence of permanent cardiac pacemaker   . Shortness of breath dyspnea   . Transfusion history    transfusions -1 month ago -2 units    Past Surgical History:  Procedure Laterality Date  . ANGIOPLASTY    . BASAL CELL CARCINOMA EXCISION     "shoulder, arm X 2"  . BIOPSY  09/02/2020   Procedure: BIOPSY;  Surgeon: Gatha Mayer, MD;  Location: [facility] ENDOSCOPY;  Service: Endoscopy;;  . CARDIAC CATHETERIZATION  2014  . CARDIAC CATHETERIZATION  2021   [facility] x1 stent  . CATARACT EXTRACTION W/ INTRAOCULAR LENS  IMPLANT, BILATERAL Bilateral 2016  . COLONOSCOPY WITH PROPOFOL N/A 10/15/2015   Procedure: COLONOSCOPY WITH PROPOFOL;  Surgeon: Gatha Mayer, MD;  Location: [facility] ENDOSCOPY;  Service: Endoscopy;  Laterality: N/A;  . CORONARY ANGIOPLASTY     Dr. Fletcher Anon  . CORONARY ARTERY BYPASS GRAFT   2011   CABG X3-4; [place], [place], [place],   . CORONARY ATHERECTOMY  04/27/2018  . CORONARY ATHERECTOMY N/A 04/27/2018   Procedure: CORONARY ATHERECTOMY;  Surgeon: Belva Crome, MD;  Location: [facility] CV LAB;  Service: Cardiovascular;  Laterality: N/A;  . cyst L kidney  [place]  . ENDARTERECTOMY Left 08/30/2015   Procedure: ENDARTERECTOMY CAROTID;  Surgeon: Katha Cabal, MD;  Location: [facility] ORS;  Service: Vascular;  Laterality: Left;  . ESOPHAGOGASTRODUODENOSCOPY (EGD) WITH PROPOFOL N/A 10/15/2015   Procedure: ESOPHAGOGASTRODUODENOSCOPY (EGD) WITH PROPOFOL;  Surgeon: Gatha Mayer, MD;  Location: [facility];  Service: Endoscopy;  Laterality: N/A;  . ESOPHAGOGASTRODUODENOSCOPY (EGD) WITH PROPOFOL N/A 01/12/2020   Procedure: ESOPHAGOGASTRODUODENOSCOPY (EGD) WITH PROPOFOL;  Surgeon: Jerene Bears, MD;  Location: [facility] ENDOSCOPY;  Service: Gastroenterology;  Laterality: N/A;  . ESOPHAGOGASTRODUODENOSCOPY (EGD) WITH PROPOFOL N/A 01/15/2020   Procedure: ESOPHAGOGASTRODUODENOSCOPY (EGD) WITH PROPOFOL;  Surgeon: Ladene Artist, MD;  Location: [facility] ENDOSCOPY;  Service: Endoscopy;  Laterality: N/A;  . ESOPHAGOGASTRODUODENOSCOPY (EGD) WITH PROPOFOL N/A 09/02/2020   Procedure: ESOPHAGOGASTRODUODENOSCOPY (EGD) WITH PROPOFOL;  Surgeon: Gatha Mayer, MD;  [facility];  Service: Endoscopy;  Laterality: N/A;  with APC  . EYE SURGERY Bilateral    "has macular edema cauterized" (04/27/2018)  . GI RADIOFREQUENCY ABLATION  09/02/2020   Procedure: GI RADIOFREQUENCY ABLATION;  Surgeon: Gatha Mayer, MD;  Location: [facility];  Service: Endoscopy;;  . HEMOSTASIS CLIP PLACEMENT  01/15/2020   Procedure: HEMOSTASIS CLIP PLACEMENT;  Surgeon: Ladene Artist, MD;  Location: [facility];  Service: Endoscopy;;  . HOT HEMOSTASIS N/A 01/12/2020   Procedure: HOT HEMOSTASIS (ARGON PLASMA COAGULATION/BICAP);  Surgeon: Jerene Bears, MD;  Location: [facility] ENDOSCOPY;  Service: Gastroenterology;   Laterality: N/A;  . HOT HEMOSTASIS N/A 01/15/2020   Procedure: HOT HEMOSTASIS (ARGON PLASMA COAGULATION/BICAP);  Surgeon: Ladene Artist, MD;  Location: [facility] ENDOSCOPY;  Service: Endoscopy;  Laterality: N/A;  . INGUINAL HERNIA REPAIR Right   . LEFT HEART CATHETERIZATION WITH CORONARY ANGIOGRAM N/A 03/09/2012   Procedure: LEFT HEART CATHETERIZATION WITH CORONARY ANGIOGRAM;  Surgeon: Wellington Hampshire, MD;  Location: [facility] CATH LAB;  Service: Cardiovascular;  Laterality: N/A;  . PACEMAKER IMPLANT N/A 01/26/2020   Procedure: PACEMAKER IMPLANT;  Surgeon: Vickie Epley, MD;  Location: [facility] CV LAB;  Service: Cardiovascular;  Laterality: N/A;  . PERCUTANEOUS CORONARY STENT INTERVENTION (PCI-S)  03/09/2012   Procedure: PERCUTANEOUS CORONARY STENT INTERVENTION (PCI-S);  Surgeon: Wellington Hampshire, MD;  Location[facility] CATH LAB;  Service: Cardiovascular;;  . POLYPECTOMY  01/15/2020   Procedure: POLYPECTOMY;  Surgeon: Ladene Artist, MD;  Location: [facility] ENDOSCOPY;  Service: Endoscopy;;  . RIGHT/LEFT HEART CATH AND CORONARY ANGIOGRAPHY N/A 04/25/2018   Procedure: RIGHT/LEFT HEART CATH AND CORONARY ANGIOGRAPHY;  Surgeon: Wellington Hampshire, MD;  Location: [place] CV LAB;  Service: Cardiovascular;  Laterality: N/A;     Current Outpatient Medications  Medication Sig Dispense Refill  . ALPRAZolam (XANAX) 0.25 MG tablet Take 0.25 mg by mouth daily as needed for anxiety.    Marland Kitchen amiodarone (PACERONE) 200 MG tablet Take 200 mg by mouth daily.    Marland Kitchen amLODipine (NORVASC) 5 MG tablet Take one tablet by mouth one time daily 90 tablet 3  . aspirin EC 81 MG EC tablet Take 1 tablet (81 mg total) by mouth daily. Swallow whole. 30 tablet 11  . Cyanocobalamin (B-12) 1000 MCG CAPS Take 1,000 mcg by mouth every evening.     . Ferrous Gluconate-C-Folic Acid (IRON-C PO) Take 1 tablet by mouth daily.    . furosemide (LASIX) 20 MG tablet Take 20 mg by mouth daily as needed for edema.    Marland Kitchen glimepiride (AMARYL) 4 MG tablet  Take 4 mg by mouth daily.    . insulin glargine (LANTUS) 100 UNIT/ML injection Inject 15 Units into the skin at bedtime.    . isosorbide mononitrate (IMDUR) 30 MG 24 hr tablet TAKE 1 TABLET BY MOUTH DAILY. 30 tablet 3  . metoprolol tartrate (LOPRESSOR) 25 MG tablet Take 1 tablet (25 mg) by mouth twice daily as needed for palpitations 30 tablet 3  . NOVOLOG FLEXPEN 100 UNIT/ML FlexPen Inject 3-30 Units into the skin 3 (three) times daily with meals.    . Omega-3 Fatty Acids (FISH OIL)  1000 MG CAPS Take 1,000 mg by mouth every evening.    . pantoprazole (PROTONIX) 40 MG tablet Take 1 tablet (40 mg total) by mouth daily. 30 tablet 0  . Polyethylene Glycol 400 (BLINK TEARS OP) Place 1 drop into both eyes daily as needed (dry eyes).    . ramipril (ALTACE) 10 MG capsule Take 10 mg by mouth daily.     . rosuvastatin (CRESTOR) 40 MG tablet TAKE ONE TABLET BY MOUTH EVERY DAY 90 tablet 0  . sucralfate (CARAFATE) 1 g tablet Take 1 g by mouth 2 (two) times daily.    . vitamin C (ASCORBIC ACID) 500 MG tablet Take 500 mg by mouth every evening.    . warfarin (COUMADIN) 5 MG tablet TAKE ONE TABLET MON AND FRI THEN TAKE ONE-HALF (1/2) TABLET EVERY DAY ALL OTHER DAYS (Patient taking differently: Take 2.5-5 mg by mouth See admin instructions. Take 5 mg by mouth on Monday and Friday and take 2.5 mg Tuesday-Thursday, Saturday and Sunday) 90 tablet 0   No current facility-administered medications for this visit.    Allergies:   Patient has no known allergies.    Social History:  The patient  reports that he has never smoked. He has never used smokeless tobacco. He reports current alcohol use. He reports that he does not use drugs.   Family History:  [name] family history includes Bladder Cancer in his mother; COPD in his mother; Cancer in an other family member; Heart attack in his father; Kidney disease in his paternal uncle; Liver cancer in his maternal aunt; Lung cancer in his maternal aunt.    ROS:   Please see the history of present illness.   Otherwise, review of systems are positive for none.   All other systems are reviewed and negative.    PHYSICAL EXAM: VS:  BP 138/60 (BP Location: Left Arm, Patient Position: Sitting, Cuff Size: Normal)   Pulse 61   Ht 5\' 8"  (1.727 m)   Wt 188 lb (85.3 kg)   SpO2 98%   BMI 28.59 kg/m  , BMI Body mass index is 28.59 kg/m. GEN: Well nourished, well developed, in no acute distress  HEENT: normal  Neck: no JVD, carotid bruits, or masses Cardiac: RRR; no rubs, or gallops, mild bilateral leg edema . There is a 3/6 crescendo decrescendo systolic murmur in the aortic area which is mid peaking.  S2 is diminished.  The murmur radiates to the carotid arteries. Respiratory:  clear to auscultation bilaterally, normal work of breathing GI: soft, nontender, nondistended, + BS MS: no deformity or atrophy  Skin: warm and dry, no rash Neuro:  Strength and sensation are intact Psych: euthymic mood, full affect    EKG:  EKG  ordered today. EKG showed atrial paced rhythm with nonspecific T wave changes.  Recent Labs: 12/26/2019: TSH 3.237 01/11/2020: ALT 27 01/13/2020: Magnesium 2.2 08/02/2020: BUN 21; Creatinine, Ser 1.49; Potassium 4.8; Sodium 136 08/12/2020: Hemoglobin 11.6; Platelets 188    Lipid Panel    Component Value Date/Time   CHOL 105 12/26/2019 0813   TRIG 123 12/26/2019 0813   HDL 36 (L) 12/26/2019 0813   CHOLHDL 2.9 12/26/2019 0813   VLDL 25 12/26/2019 0813   LDLCALC 44 12/26/2019 0813      Wt Readings from Last 3 Encounters:  09/10/20 188 lb (85.3 kg)  09/02/20 180 lb (81.6 kg)  08/05/20 189 lb 6.4 oz (85.9 kg)        ASSESSMENT AND PLAN:  1.  Coronary artery disease involving bypass graft without angina: He is overall doing well and seems to be stable.  Given continued issues with blood loss anemia, I will consider stopping his aspirin in the next 6 months as long as no recurrent ischemic cardiac events.  2. Bilateral  carotid artery stenosis: Status post  left carotid endarterectomy. Followed by Dr. Delana Meyer.  3. moderate aortic stenosis: This was stable on most recent echocardiogram .  Repeat echocardiogram is requested to be done in September of this year.  4. Persistent atrial fibrillation: He is maintaining in sinus rhythm with amiodarone 200 mg once daily.  He is on anticoagulation with warfarin.  We discussed alternatives to warfarin and given his GI issues, I think the safest option is Eliquis.  He agreed to switch and will place him on 5 mg twice daily.  5. Hyperlipidemia: Continue high-dose rosuvastatin.  His LDL has been below 70.  6. Anemia: Improved with iron transfusion.  He continues to follow with gastroenterology.  We will see if there is improvement after switching warfarin to Eliquis.  As mentioned above, I would also consider stopping aspirin in the next 6 months.    Disposition:   Follow-up in 6 months   Signed,  Kathlyn Sacramento, MD  09/10/2020 4:23 PM    [place]

## 2020-09-10 NOTE — Patient Instructions (Addendum)
Medication Instructions:  Your physician has recommended you make the following change in your medication:   STOP Coumadin (Do not take anymore)  START Eliquis 5 mg twice daily. An Rx has been sent to your pharmacy Take your first dose of Eliquis on 09/12/20 Thursday morning.   You will need to HOLD Eliquis 2 days prior to your EGD  *If you need a refill on your cardiac medications before your next appointment, please call your pharmacy*   Lab Work: None ordered  If you have labs (blood work) drawn today and your tests are completely normal, you will receive your results only by: Marland Kitchen MyChart Message (if you have MyChart) OR . A paper copy in the mail If you have any lab test that is abnormal or we need to change your treatment, we will call you to review the results.   Testing/Procedures: Your physician has requested that you have an echocardiogram. Echocardiography is a painless test that uses sound waves to create images of your heart. It provides your doctor with information about the size and shape of your heart and how well your heart's chambers and valves are working. This procedure takes approximately one hour. There are no restrictions for this procedure. (To be scheduled in Sept 2022)   Follow-Up: At [facility], you and your health needs are our priority.  As part of our continuing mission to provide you with exceptional heart care, we have created designated Provider Care Teams.  These Care Teams include your primary Cardiologist (physician) and Advanced Practice Providers (APPs -  Physician Assistants and Nurse Practitioners) who all work together to provide you with the care you need, when you need it.  We recommend signing up for the patient portal called "MyChart".  Sign up information is provided on this After Visit Summary.  MyChart is used to connect with patients for Virtual Visits (Telemedicine).  Patients are able to view lab/test results, encounter notes, upcoming  appointments, etc.  Non-urgent messages can be sent to your provider as well.   To learn more about what you can do with MyChart, go to [email or web address].    Your next appointment:   September 2022. 1 week after the echo  The format for your next appointment:   In Person  Provider:   You may see Kathlyn Sacramento, MD or one of the following Advanced Practice Providers on your designated Care Team:    Murray Hodgkins, NP  Christell Faith, PA-C  Marrianne Mood, PA-C  Cadence Hallett, [place]  Laurann Montana, NP    Other Instructions N/A

## 2020-09-13 ENCOUNTER — Inpatient Hospital Stay: Payer: HMO

## 2020-09-13 ENCOUNTER — Other Ambulatory Visit: Payer: Self-pay

## 2020-09-13 ENCOUNTER — Inpatient Hospital Stay: Payer: HMO | Attending: Internal Medicine

## 2020-09-13 VITALS — BP 150/75 | HR 69 | Temp 96.7°F | Resp 18

## 2020-09-13 DIAGNOSIS — D500 Iron deficiency anemia secondary to blood loss (chronic): Secondary | ICD-10-CM

## 2020-09-13 DIAGNOSIS — Z79899 Other long term (current) drug therapy: Secondary | ICD-10-CM | POA: Diagnosis not present

## 2020-09-13 LAB — CBC WITH DIFFERENTIAL/PLATELET
Abs Immature Granulocytes: 0.01 10*3/uL (ref 0.00–0.07)
Basophils Absolute: 0 10*3/uL (ref 0.0–0.1)
Basophils Relative: 1 %
Eosinophils Absolute: 0.1 10*3/uL (ref 0.0–0.5)
Eosinophils Relative: 3 %
HCT: 36.9 % — ABNORMAL LOW (ref 39.0–52.0)
Hemoglobin: 11.9 g/dL — ABNORMAL LOW (ref 13.0–17.0)
Immature Granulocytes: 0 %
Lymphocytes Relative: 17 %
Lymphs Abs: 0.5 10*3/uL — ABNORMAL LOW (ref 0.7–4.0)
MCH: 33.1 pg (ref 26.0–34.0)
MCHC: 32.2 g/dL (ref 30.0–36.0)
MCV: 102.5 fL — ABNORMAL HIGH (ref 80.0–100.0)
Monocytes Absolute: 0.3 10*3/uL (ref 0.1–1.0)
Monocytes Relative: 12 %
Neutro Abs: 1.9 10*3/uL (ref 1.7–7.7)
Neutrophils Relative %: 67 %
Platelets: 184 10*3/uL (ref 150–400)
RBC: 3.6 MIL/uL — ABNORMAL LOW (ref 4.22–5.81)
RDW: 13.1 % (ref 11.5–15.5)
WBC: 2.9 10*3/uL — ABNORMAL LOW (ref 4.0–10.5)
nRBC: 0 % (ref 0.0–0.2)

## 2020-09-13 MED ORDER — SODIUM CHLORIDE 0.9 % IV SOLN
Freq: Once | INTRAVENOUS | Status: AC
  Filled 2020-09-13: qty 250

## 2020-09-13 MED ORDER — SODIUM CHLORIDE 0.9 % IV SOLN
510.0000 mg | Freq: Once | INTRAVENOUS | Status: AC
  Administered 2020-09-13: 510 mg via INTRAVENOUS
  Filled 2020-09-13: qty 510

## 2020-09-13 NOTE — Progress Notes (Signed)
Pt received IV fereheme in clinic today. VSS @ d/c.

## 2020-09-20 DIAGNOSIS — G4733 Obstructive sleep apnea (adult) (pediatric): Secondary | ICD-10-CM | POA: Diagnosis not present

## 2020-10-08 ENCOUNTER — Telehealth: Payer: Self-pay | Admitting: *Deleted

## 2020-10-08 ENCOUNTER — Other Ambulatory Visit: Payer: Self-pay | Admitting: Cardiovascular Disease

## 2020-10-08 NOTE — Telephone Encounter (Signed)
Spoke with patient. Patient agreeable to virtual visit at the [facility] [place] location via [place] with Dr. Rogue Bussing at next apt 6/10.

## 2020-10-20 DIAGNOSIS — G4733 Obstructive sleep apnea (adult) (pediatric): Secondary | ICD-10-CM | POA: Diagnosis not present

## 2020-10-23 DIAGNOSIS — C44519 Basal cell carcinoma of skin of other part of trunk: Secondary | ICD-10-CM | POA: Diagnosis not present

## 2020-10-23 DIAGNOSIS — D485 Neoplasm of uncertain behavior of skin: Secondary | ICD-10-CM | POA: Diagnosis not present

## 2020-10-23 DIAGNOSIS — X32XXXA Exposure to sunlight, initial encounter: Secondary | ICD-10-CM | POA: Diagnosis not present

## 2020-10-23 DIAGNOSIS — C44319 Basal cell carcinoma of skin of other parts of face: Secondary | ICD-10-CM | POA: Diagnosis not present

## 2020-10-23 DIAGNOSIS — L570 Actinic keratosis: Secondary | ICD-10-CM | POA: Diagnosis not present

## 2020-10-23 DIAGNOSIS — B078 Other viral warts: Secondary | ICD-10-CM | POA: Diagnosis not present

## 2020-11-01 ENCOUNTER — Inpatient Hospital Stay (HOSPITAL_BASED_OUTPATIENT_CLINIC_OR_DEPARTMENT_OTHER): Payer: HMO | Admitting: Internal Medicine

## 2020-11-01 ENCOUNTER — Inpatient Hospital Stay: Payer: HMO

## 2020-11-01 ENCOUNTER — Inpatient Hospital Stay: Payer: HMO | Attending: Internal Medicine

## 2020-11-01 ENCOUNTER — Encounter: Payer: Self-pay | Admitting: Internal Medicine

## 2020-11-01 VITALS — BP 127/56 | HR 60 | Temp 98.5°F | Resp 16 | Ht 68.0 in | Wt 189.0 lb

## 2020-11-01 DIAGNOSIS — Z7901 Long term (current) use of anticoagulants: Secondary | ICD-10-CM | POA: Insufficient documentation

## 2020-11-01 DIAGNOSIS — D500 Iron deficiency anemia secondary to blood loss (chronic): Secondary | ICD-10-CM | POA: Insufficient documentation

## 2020-11-01 DIAGNOSIS — E119 Type 2 diabetes mellitus without complications: Secondary | ICD-10-CM | POA: Diagnosis not present

## 2020-11-01 DIAGNOSIS — D649 Anemia, unspecified: Secondary | ICD-10-CM | POA: Diagnosis not present

## 2020-11-01 DIAGNOSIS — K254 Chronic or unspecified gastric ulcer with hemorrhage: Secondary | ICD-10-CM | POA: Insufficient documentation

## 2020-11-01 DIAGNOSIS — N183 Chronic kidney disease, stage 3 unspecified: Secondary | ICD-10-CM | POA: Insufficient documentation

## 2020-11-01 DIAGNOSIS — Z794 Long term (current) use of insulin: Secondary | ICD-10-CM | POA: Diagnosis not present

## 2020-11-01 DIAGNOSIS — D72818 Other decreased white blood cell count: Secondary | ICD-10-CM | POA: Insufficient documentation

## 2020-11-01 DIAGNOSIS — I4891 Unspecified atrial fibrillation: Secondary | ICD-10-CM | POA: Insufficient documentation

## 2020-11-01 DIAGNOSIS — Z7982 Long term (current) use of aspirin: Secondary | ICD-10-CM | POA: Insufficient documentation

## 2020-11-01 DIAGNOSIS — I2510 Atherosclerotic heart disease of native coronary artery without angina pectoris: Secondary | ICD-10-CM | POA: Diagnosis not present

## 2020-11-01 DIAGNOSIS — D696 Thrombocytopenia, unspecified: Secondary | ICD-10-CM | POA: Diagnosis not present

## 2020-11-01 LAB — FERRITIN: Ferritin: 80 ng/mL (ref 24–336)

## 2020-11-01 LAB — CBC WITH DIFFERENTIAL/PLATELET
Abs Immature Granulocytes: 0.01 10*3/uL (ref 0.00–0.07)
Basophils Absolute: 0 10*3/uL (ref 0.0–0.1)
Basophils Relative: 1 %
Eosinophils Absolute: 0.1 10*3/uL (ref 0.0–0.5)
Eosinophils Relative: 2 %
HCT: 38 % — ABNORMAL LOW (ref 39.0–52.0)
Hemoglobin: 12.4 g/dL — ABNORMAL LOW (ref 13.0–17.0)
Immature Granulocytes: 0 %
Lymphocytes Relative: 18 %
Lymphs Abs: 0.6 10*3/uL — ABNORMAL LOW (ref 0.7–4.0)
MCH: 32.4 pg (ref 26.0–34.0)
MCHC: 32.6 g/dL (ref 30.0–36.0)
MCV: 99.2 fL (ref 80.0–100.0)
Monocytes Absolute: 0.4 10*3/uL (ref 0.1–1.0)
Monocytes Relative: 12 %
Neutro Abs: 2.4 10*3/uL (ref 1.7–7.7)
Neutrophils Relative %: 67 %
Platelets: 162 10*3/uL (ref 150–400)
RBC: 3.83 MIL/uL — ABNORMAL LOW (ref 4.22–5.81)
RDW: 12.8 % (ref 11.5–15.5)
WBC: 3.6 10*3/uL — ABNORMAL LOW (ref 4.0–10.5)
nRBC: 0 % (ref 0.0–0.2)

## 2020-11-01 LAB — BASIC METABOLIC PANEL
Anion gap: 10 (ref 5–15)
BUN: 26 mg/dL — ABNORMAL HIGH (ref 8–23)
CO2: 23 mmol/L (ref 22–32)
Calcium: 8.9 mg/dL (ref 8.9–10.3)
Chloride: 101 mmol/L (ref 98–111)
Creatinine, Ser: 1.7 mg/dL — ABNORMAL HIGH (ref 0.61–1.24)
GFR, Estimated: 42 mL/min — ABNORMAL LOW (ref >60)
Glucose, Bld: 313 mg/dL — ABNORMAL HIGH (ref 70–99)
Potassium: 5 mmol/L (ref 3.5–5.1)
Sodium: 134 mmol/L — ABNORMAL LOW (ref 135–145)

## 2020-11-01 LAB — IRON AND TIBC
Iron: 92 ug/dL (ref 45–182)
Saturation Ratios: 29 % (ref 17.9–39.5)
TIBC: 319 ug/dL (ref 250–450)
UIBC: 227 ug/dL

## 2020-11-01 NOTE — Assessment & Plan Note (Addendum)
#  Macrocytic anemia-likely multifactorial anemia [iron deficient-likely secondary to gastric losses [gasric ulcer s/p APC] versus others-question cirrhosis.  Less likely bone marrow dysfunction. STABLE.   # Today hemoglobin 12.2; hold iron infusion.    # CKD-III-stable.  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 STABLE; Monitor for now.  #CAD [aspirin 81mg /day; A. Fib [on coumadin]  # DISPOSITION:  # HOLD Ferrahem today # labs- in 6 weeks- cbc/possible ferrahem # follow up in 3 months- MD; labs-cbc/bmp possible ferrahem -Dr.B

## 2020-11-01 NOTE — Progress Notes (Signed)
[facility] OFFICE PROGRESS NOTE  Patient Care Team: Rusty Aus, MD as PCP - General (Internal Medicine)  SUMMARY OF ONCOLOGIC HISTORY:  # MARCH 2017- April 2017- IRON DEFICIENCY ANEMIA ? Etiology s/p IV iron [EGD- ?June 2017- Bleeding gastric "polyp"/ colo- Dr.Gessner]; recommend CAPSULE STUDY [on HOLD sec to ?plavix]; September 2021 hemoglobin 6.5 [inpatient-EGD gastric bleeding ulcer s/p APC]; s/p 2 units PRBC;   # hx of A.fib- on coumadin-HOLD April 2017/ CAD [Dr.[name]]; Hx of gastric ulcer [Dr.Gessner];   TIA/ CEA [s/p April 2017]; OSA on CPAP; September 2021 status post permanent pacemaker  History of present illness:   75 -year-old Caucasian male patient is here for follow-up of his severe iron deficiency anemia-;also on Coumadin for A. fib is here for follow-up.  Patient denies any blood in stools or black or stools.  Denies any nausea vomiting abdominal pain.  Denies any significant fatigue  Review of Systems  Constitutional:  Negative for chills, diaphoresis and fever.  HENT:  Negative for nosebleeds and sore throat.   Eyes:  Negative for double vision.  Respiratory:  Negative for cough, hemoptysis, sputum production and wheezing.   Cardiovascular:  Negative for chest pain, palpitations, orthopnea and leg swelling.  Gastrointestinal:  Positive for diarrhea. Negative for abdominal pain, blood in stool, constipation, heartburn, melena, nausea and vomiting.  Genitourinary:  Negative for dysuria, frequency and urgency.  Musculoskeletal:  Positive for back pain and joint pain.  Skin: Negative.  Negative for itching and rash.  Neurological:  Negative for dizziness, tingling, focal weakness, weakness and headaches.  Endo/Heme/Allergies:  Does not bruise/bleed easily.  Psychiatric/Behavioral:  Negative for depression. The patient is not nervous/anxious and does not have insomnia.    Past Medical History:  Diagnosis Date   Basal cell carcinoma of skin 2012    removed several spots from arms and back   CAD (coronary artery disease)    a. 01/2010 CABG x 4: LIMA->LAD, VG->D1, VG->OM1, VG->PDA. b. NSTEMI 02/2012 in setting of AF-RVR with cath s/p BMS to RCA (plan for 1 month, possibly up to 3 months of Plavix)   Carotid disease, bilateral ([place])    a. 03/2011 U/S: 40-59% bilat Carotid dzs;  b. 04/2015 Carotid U/S: 40-59% bilat ICA stenosis; c. 05/2015 CTA Neck: 25% RICA, 63% LICA, mod-marked R vertebral stenosis, mod L vertebral stenosis.   Chronic kidney disease    small obtusion per pcp. ultrasound done on 08/29/2015   Diabetes mellitus type II, controlled ([place])    a. Variable CBG 02/2012 - several meds adjusted.   Dysrhythmia    intermittent Atrial Fibrillation   GERD (gastroesophageal reflux disease)    Heart murmur    Hyperlipidemia    Hypertension    Hypertensive heart disease    Iron deficiency anemia    "gets infusions q once in awhile" (04/27/2018)   Iron deficiency anemia 08/2015   received 2 units rbc one week ago, 3 IV iron infusion -last 1 week.   Macular edema bil   lazer work done previously   Mild aortic stenosis    a. 03/2011 Echo: EF 55-60%, Mild AS. b. Mild by cath 02/2012; c. 01/2014 Echo: EF 65-70%, Gr 1 DD, mild AS, mildly dil LA.   Multiple gastric ulcers 2006   Myocardial infarction [facility]) 02/2012   stents (x1) at time   Myocardial infarction [facility])    "2nd one was after 02/2012; don't know date" (04/27/2018)   Obesity    On home oxygen therapy    "  2.5 w/CPAP" (04/27/2018)   OSA on CPAP    CPAP settings 3 with oxygen 2.5 (04/27/2018)   PAF (paroxysmal atrial fibrillation) ([place])    a. Newly dx 02/2012 & initiated on Coumadin (spont converted to NSR).   Presence of permanent cardiac pacemaker    Shortness of breath dyspnea    Transfusion history    transfusions -1 month ago -2 units    Past Surgical History:  Procedure Laterality Date   ANGIOPLASTY     BASAL CELL CARCINOMA EXCISION     "shoulder, arm X 2"   BIOPSY   09/02/2020   Procedure: BIOPSY;  Surgeon: Gatha Mayer, MD;  Location: [facility] ENDOSCOPY;  Service: Endoscopy;;   CARDIAC CATHETERIZATION  2014   CARDIAC CATHETERIZATION  2021   [facility] x1 stent   CATARACT EXTRACTION W/ INTRAOCULAR LENS  IMPLANT, BILATERAL Bilateral 2016   COLONOSCOPY WITH PROPOFOL N/A 10/15/2015   Procedure: COLONOSCOPY WITH PROPOFOL;  Surgeon: Gatha Mayer, MD;  Location: [facility];  Service: Endoscopy;  Laterality: N/A;   CORONARY ANGIOPLASTY     Dr. Fletcher Anon   CORONARY ARTERY BYPASS GRAFT  2011   CABG X3-4; [facility], [place], [place],    CORONARY ATHERECTOMY  04/27/2018   CORONARY ATHERECTOMY N/A 04/27/2018   Procedure: CORONARY ATHERECTOMY;  Surgeon: Belva Crome, MD;  Location: [place] CV LAB;  Service: Cardiovascular;  Laterality: N/A;   cyst L kidney  [place] Left 08/30/2015   Procedure: ENDARTERECTOMY CAROTID;  Surgeon: Katha Cabal, MD;  Location: [facility] ORS;  Service: Vascular;  Laterality: Left;   ESOPHAGOGASTRODUODENOSCOPY (EGD) WITH PROPOFOL N/A 10/15/2015   Procedure: ESOPHAGOGASTRODUODENOSCOPY (EGD) WITH PROPOFOL;  Surgeon: Gatha Mayer, MD;  Location: [facility];  Service: Endoscopy;  Laterality: N/A;   ESOPHAGOGASTRODUODENOSCOPY (EGD) WITH PROPOFOL N/A 01/12/2020   Procedure: ESOPHAGOGASTRODUODENOSCOPY (EGD) WITH PROPOFOL;  Surgeon: Jerene Bears, MD;  Location: [facility] ENDOSCOPY;  Service: Gastroenterology;  Laterality: N/A;   ESOPHAGOGASTRODUODENOSCOPY (EGD) WITH PROPOFOL N/A 01/15/2020   Procedure: ESOPHAGOGASTRODUODENOSCOPY (EGD) WITH PROPOFOL;  Surgeon: Ladene Artist, MD;  Location: [facility] ENDOSCOPY;  Service: Endoscopy;  Laterality: N/A;   ESOPHAGOGASTRODUODENOSCOPY (EGD) WITH PROPOFOL N/A 09/02/2020   Procedure: ESOPHAGOGASTRODUODENOSCOPY (EGD) WITH PROPOFOL;  Surgeon: Gatha Mayer, MD;  Location: [facility];  Service: Endoscopy;  Laterality: N/A;  with APC   EYE SURGERY Bilateral    "has macular  edema cauterized" (04/27/2018)   GI RADIOFREQUENCY ABLATION  09/02/2020   Procedure: GI RADIOFREQUENCY ABLATION;  Surgeon: Gatha Mayer, MD;  Location: [facility];  Service: Endoscopy;;   HEMOSTASIS CLIP PLACEMENT  01/15/2020   Procedure: HEMOSTASIS CLIP PLACEMENT;  Surgeon: Ladene Artist, MD;  Location: [facility];  Service: Endoscopy;;   HOT HEMOSTASIS N/A 01/12/2020   Procedure: HOT HEMOSTASIS (ARGON PLASMA COAGULATION/BICAP);  Surgeon: Jerene Bears, MD;  Location: [facility] ENDOSCOPY;  Service: Gastroenterology;  Laterality: N/A;   HOT HEMOSTASIS N/A 01/15/2020   Procedure: HOT HEMOSTASIS (ARGON PLASMA COAGULATION/BICAP);  Surgeon: Ladene Artist, MD;  Location: [facility] ENDOSCOPY;  Service: Endoscopy;  Laterality: N/A;   INGUINAL HERNIA REPAIR Right    LEFT HEART CATHETERIZATION WITH CORONARY ANGIOGRAM N/A 03/09/2012   Procedure: LEFT HEART CATHETERIZATION WITH CORONARY ANGIOGRAM;  Surgeon: [place] Hampshire, MD;  Location: [place] CATH LAB;  Service: Cardiovascular;  Laterality: N/A;   PACEMAKER IMPLANT N/A 01/26/2020   Procedure: PACEMAKER IMPLANT;  Surgeon: Vickie Epley, MD;  Location: [place] CV LAB;  Service: Cardiovascular;  Laterality: N/A;   PERCUTANEOUS CORONARY STENT INTERVENTION (PCI-S)  03/09/2012   Procedure: PERCUTANEOUS CORONARY STENT INTERVENTION (PCI-S);  Surgeon: Wellington Hampshire, MD;  Location: [facility] CATH LAB;  Service: Cardiovascular;;   POLYPECTOMY  01/15/2020   Procedure: POLYPECTOMY;  Surgeon: Ladene Artist, MD;  Location: [place];  Service: Endoscopy;;   RIGHT/LEFT HEART CATH AND CORONARY ANGIOGRAPHY N/A 04/25/2018   Procedure: RIGHT/LEFT HEART CATH AND CORONARY ANGIOGRAPHY;  Surgeon: Wellington Hampshire, MD;  Location: [place] CV LAB;  Service: Cardiovascular;  Laterality: N/A;    Family History  Problem Relation Age of Onset   COPD Mother        alive 91   Bladder Cancer Mother    Heart attack Father        15 deceased   Lung cancer Maternal Aunt     Liver cancer Maternal Aunt    Kidney disease Paternal Uncle    Cancer Other        all paternal aunts and uncles   Prostate cancer Neg Hx    Kidney cancer Neg Hx     SOCIAL HISTORY:   Social History  Substance Use Topics   Smoking status: Never Smoker    Smokeless tobacco: Never Used     Comment: tobacco use - no   Alcohol Use: Yes     Comment: rare drink    ALLERGIES:  has No Known Allergies.  MEDICATIONS:  Current Outpatient Prescriptions  Medication Sig Dispense Refill   acetaminophen (TYLENOL) 325 MG tablet Take 650 mg by mouth every 6 (six) hours as needed. For pain     amiodarone (PACERONE) 200 MG tablet Take 0.5 tablets (100 mg total) by mouth daily. (Patient taking differently: Take 100 mg by mouth at bedtime. ) 45 tablet 3   amLODipine (NORVASC) 5 MG tablet Take one tablet by mouth one time daily 90 tablet 3   aspirin 81 MG EC tablet Take 81 mg by mouth daily.       atorvastatin (LIPITOR) 20 MG tablet Take 1 tablet (20 mg total) by mouth at bedtime. 90 tablet 3   furosemide (LASIX) 20 MG tablet Take 1 tablet (20 mg total) by mouth as needed. 30 tablet 6   glimepiride (AMARYL) 4 MG tablet Take 4 mg by mouth 2 (two) times daily.       insulin glargine (LANTUS) 100 UNIT/ML injection Inject 20 Units into the skin daily.      insulin lispro (HUMALOG) 100 UNIT/ML injection Inject 8 Units into the skin 2 (two) times daily with a meal. (Patient taking differently: Inject into the skin 3 (three) times daily. Per sliding scale.)     metFORMIN (GLUCOPHAGE) 500 MG tablet Take 500 mg by mouth 2 (two) times daily with a meal.     nitroGLYCERIN (NITROSTAT) 0.4 MG SL tablet Place 1 tablet (0.4 mg total) under the tongue every 5 (five) minutes as needed (up to 3 doses). 25 tablet 1   OXYGEN Inhale 2.5 L/min into the lungs at bedtime. With CPAP     pantoprazole (PROTONIX) 40 MG tablet Take 1 tablet (40 mg total) by mouth daily. (Patient taking differently: Take 40 mg by mouth 2 (two) times  daily. ) 90 tablet 3   ramipril (ALTACE) 10 MG capsule Take 1 capsule (10 mg total) by mouth daily. 90 capsule 5   warfarin (COUMADIN) 5 MG tablet Take as directed by anticoagulation clinic 90 tablet 1   IRON-VITAMIN C PO Take  1 tablet by mouth daily. Reported on 08/27/2015     Omega-3 Fatty Acids (FISH OIL) 1200 MG CAPS Take 1 capsule by mouth daily. Reported on 08/27/2015     No current facility-administered medications for this visit.    PHYSICAL EXAMINATION:   BP 158/71 mmHg  Pulse 51  Temp(Src) 96.1 F (35.6 C)  Resp 18  Wt 192 lb 0.3 oz (87.1 kg)  Filed Weights   08/27/15 1015  Weight: 192 lb 0.3 oz (87.1 kg)    Physical Exam Constitutional:      Comments: Patient alone.  HENT:     Head: Normocephalic and atraumatic.     Mouth/Throat:     Pharynx: No oropharyngeal exudate.  Eyes:     Pupils: Pupils are equal, round, and reactive to light.  Cardiovascular:     Rate and Rhythm: Normal rate and regular rhythm.  Pulmonary:     Effort: No respiratory distress.     Breath sounds: No wheezing.  Abdominal:     General: Bowel sounds are normal. There is no distension.     Palpations: Abdomen is soft. There is no mass.     Tenderness: There is no abdominal tenderness. There is no guarding or rebound.  Musculoskeletal:        General: No tenderness. Normal range of motion.     Cervical back: Normal range of motion and neck supple.  Skin:    General: Skin is warm.  Neurological:     Mental Status: He is alert and oriented to person, place, and time.  Psychiatric:        Mood and Affect: Affect normal.     LABORATORY DATA:  I have reviewed the data as listed    Component Value Date/Time   NA 139 07/25/2015 1138   NA 139 01/22/2013 0348   K 4.1 07/25/2015 1138   K 4.2 01/22/2013 0348   CL 103 07/25/2015 1138   CL 106 01/22/2013 0348   CO2 27 07/25/2015 1138   CO2 28 01/22/2013 0348   GLUCOSE 216* 07/25/2015 1138   GLUCOSE 158* 01/22/2013 0348   BUN 14  07/25/2015 1138   BUN 16 01/22/2013 0348   CREATININE 1.03 07/25/2015 1138   CREATININE 1.02 01/22/2013 0348   CALCIUM 9.1 07/25/2015 1138   CALCIUM 8.6 01/22/2013 0348   PROT 7.2 06/25/2015 1045   PROT 7.5 01/21/2013 0209   ALBUMIN 4.1 06/25/2015 1045   ALBUMIN 4.3 01/21/2013 0209   AST 30 06/25/2015 1045   AST 28 01/21/2013 0209   ALT 33 06/25/2015 1045   ALT 36 01/21/2013 0209   ALKPHOS 104 06/25/2015 1045   ALKPHOS 143* 01/21/2013 0209   BILITOT 0.4 06/25/2015 1045   BILITOT 0.4 01/21/2013 0209   GFRNONAA >60 07/25/2015 1138   GFRNONAA >60 01/22/2013 0348   GFRAA >60 07/25/2015 1138   GFRAA >60 01/22/2013 0348    No results found for: SPEP, UPEP  Lab Results  Component Value Date   WBC 4.5 08/22/2015   NEUTROABS 2.5 08/22/2015   HGB 8.0 Repeated and verified X2.* 08/22/2015   HCT 26.2 Repeated and verified X2.* 08/22/2015   MCV 67.4 Repeated and verified X2.* 08/22/2015   PLT 264.0 08/22/2015      Chemistry      Component Value Date/Time   NA 139 07/25/2015 1138   NA 139 01/22/2013 0348   K 4.1 07/25/2015 1138   K 4.2 01/22/2013 0348   CL 103 07/25/2015 1138   CL  106 01/22/2013 0348   CO2 27 07/25/2015 1138   CO2 28 01/22/2013 0348   BUN 14 07/25/2015 1138   BUN 16 01/22/2013 0348   CREATININE 1.03 07/25/2015 1138   CREATININE 1.02 01/22/2013 0348      Component Value Date/Time   CALCIUM 9.1 07/25/2015 1138   CALCIUM 8.6 01/22/2013 0348   ALKPHOS 104 06/25/2015 1045   ALKPHOS 143* 01/21/2013 0209   AST 30 06/25/2015 1045   AST 28 01/21/2013 0209   ALT 33 06/25/2015 1045   ALT 36 01/21/2013 0209   BILITOT 0.4 06/25/2015 1045   BILITOT 0.4 01/21/2013 0209        ASSESSMENT & PLAN:   Iron deficiency anemia due to chronic blood loss #Macrocytic anemia-likely multifactorial anemia [iron deficient-likely secondary to gastric losses [gasric ulcer s/p APC] versus others-question cirrhosis.  Less likely bone marrow dysfunction. STABLE.   # Today  hemoglobin 12.2; hold iron infusion.    # CKD-III-stable.  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 STABLE; Monitor for now.  #CAD [aspirin 81mg /day; A. Fib [on coumadin]  # DISPOSITION:  # HOLD Ferrahem today # labs- in 6 weeks- cbc/possible ferrahem # follow up in 3 months- MD; labs-cbc/bmp possible ferrahem -Dr.B

## 2020-11-02 LAB — HEMOGLOBIN A1C
Hgb A1c MFr Bld: 7.5 % — ABNORMAL HIGH (ref 4.8–5.6)
Mean Plasma Glucose: 169 mg/dL

## 2020-11-04 DIAGNOSIS — D500 Iron deficiency anemia secondary to blood loss (chronic): Secondary | ICD-10-CM | POA: Diagnosis not present

## 2020-11-04 DIAGNOSIS — Z125 Encounter for screening for malignant neoplasm of prostate: Secondary | ICD-10-CM | POA: Diagnosis not present

## 2020-11-04 DIAGNOSIS — N1832 Chronic kidney disease, stage 3b: Secondary | ICD-10-CM | POA: Diagnosis not present

## 2020-11-04 DIAGNOSIS — Z0000 Encounter for general adult medical examination without abnormal findings: Secondary | ICD-10-CM | POA: Diagnosis not present

## 2020-11-04 DIAGNOSIS — E1151 Type 2 diabetes mellitus with diabetic peripheral angiopathy without gangrene: Secondary | ICD-10-CM | POA: Diagnosis not present

## 2020-11-04 DIAGNOSIS — E538 Deficiency of other specified B group vitamins: Secondary | ICD-10-CM | POA: Diagnosis not present

## 2020-11-04 LAB — KAPPA/LAMBDA LIGHT CHAINS
Kappa free light chain: 30.7 mg/L — ABNORMAL HIGH (ref 3.3–19.4)
Kappa, lambda light chain ratio: 1.4 (ref 0.26–1.65)
Lambda free light chains: 22 mg/L (ref 5.7–26.3)

## 2020-11-05 LAB — MULTIPLE MYELOMA PANEL, SERUM
Albumin SerPl Elph-Mcnc: 4.1 g/dL (ref 2.9–4.4)
Albumin/Glob SerPl: 1.9 — ABNORMAL HIGH (ref 0.7–1.7)
Alpha 1: 0.2 g/dL (ref 0.0–0.4)
Alpha2 Glob SerPl Elph-Mcnc: 0.7 g/dL (ref 0.4–1.0)
B-Globulin SerPl Elph-Mcnc: 0.9 g/dL (ref 0.7–1.3)
Gamma Glob SerPl Elph-Mcnc: 0.5 g/dL (ref 0.4–1.8)
Globulin, Total: 2.2 g/dL (ref 2.2–3.9)
IgA: 318 mg/dL (ref 61–437)
IgG (Immunoglobin G), Serum: 596 mg/dL — ABNORMAL LOW (ref 603–1613)
IgM (Immunoglobulin M), Srm: 43 mg/dL (ref 15–143)
Total Protein ELP: 6.3 g/dL (ref 6.0–8.5)

## 2020-11-07 ENCOUNTER — Encounter: Payer: Self-pay | Admitting: Internal Medicine

## 2020-11-07 ENCOUNTER — Telehealth: Payer: Self-pay | Admitting: *Deleted

## 2020-11-07 DIAGNOSIS — C44319 Basal cell carcinoma of skin of other parts of face: Secondary | ICD-10-CM | POA: Diagnosis not present

## 2020-11-07 DIAGNOSIS — D0439 Carcinoma in situ of skin of other parts of face: Secondary | ICD-10-CM | POA: Diagnosis not present

## 2020-11-07 NOTE — Telephone Encounter (Signed)
Patient would like a return call to discuss his results. Next appointment 12/13/20  Multiple Myeloma Panel (SPEP&IFE w/QIG) Order: [id number] Status: Edited Result - FINAL   Visible to patient: Yes (seen)   Next appt: 12/13/2020 at 01:00 PM in Oncology ([facility])   Dx: Normocytic anemia   0 Result Notes  Component Ref Range & Units 6 d ago   IgG (Immunoglobin G), Serum 603 - 1,613 mg/dL 596 Low    IgA 61 - 437 mg/dL 318   IgM (Immunoglobulin M), Srm 15 - 143 mg/dL 43   Total Protein ELP 6.0 - 8.5 g/dL 6.3 VC   Albumin SerPl Elph-Mcnc 2.9 - 4.4 g/dL 4.1 VC   Alpha 1 0.0 - 0.4 g/dL 0.2 VC   Alpha2 Glob SerPl Elph-Mcnc 0.4 - 1.0 g/dL 0.7 VC   B-Globulin SerPl Elph-Mcnc 0.7 - 1.3 g/dL 0.9 VC   Gamma Glob SerPl Elph-Mcnc 0.4 - 1.8 g/dL 0.5 VC   M Protein SerPl Elph-Mcnc Not Observed g/dL Not Observed VC   Globulin, Total 2.2 - 3.9 g/dL 2.2 VC   Albumin/Glob SerPl 0.7 - 1.7 1.9 High  VC   IFE 1  Comment VC   Comment: (NOTE)  The immunofixation pattern appears unremarkable. Evidence of  monoclonal protein is not apparent.   Please Note  Comment VC   Comment: (NOTE)  Protein electrophoresis scan will follow via computer, mail, or  courier delivery.  Performed At: [facility]  [place], [place] [id number]  Rush Farmer MD VO:[id number]   Resulting Agency  [facility]         Specimen Collected: 11/01/20 13:51 Last Resulted: 11/05/20 13:42      Lab Flowsheet    Order Details    View Encounter    Lab and Collection Details    Routing    Result History    View Encounter Conversation      VC=Value has a corrected status       Result Care Coordination    Patient Communication   Add Comments   Seen Back to Top        Other Results from 11/01/2020    Contains abnormal data Hemoglobin A1c Order: [id number] Status: Final result   Visible to patient: Yes (seen)   Next appt: 12/13/2020 at 01:00 PM in Oncology ([facility])   Dx: Controlled  type 2 diabetes mellitus w...   0 Result Notes   1 HM Topic  Component Ref Range & Units 6 d ago 10 mo ago 10 yr ago  Hgb A1c MFr Bld 4.8 - 5.6 % 7.5 High   5.7 High  CM   High  8.9  (NOTE)                                                                       According to the ADA Clinical Practice Recommendations for 2011, when HbA1c is used as a screening test:   >=6.5%   Diagnostic of Diabetes Mellitus           (if abnormal result   is confirmed)  5.7-6.4%   Increased risk of developing Diabetes Mellitus  References:Diagnosis and Classification of Diabetes Mellitus,Diabetes ELFY,1017,51(WCHEN 1):S62-S69 and Standards of Medical Care in  Diabetes - 2011,Diabetes Care,2011,34   (Suppl 1):S11-S61.   R    Comment: (NOTE)          Prediabetes: 5.7 - 6.4          Diabetes: >6.4          Glycemic control for adults with diabetes: <7.0   Mean Plasma Glucose mg/dL 169  116.89 CM  209 High  R   Comment: (NOTE)  Performed At: [facility]  [address] [place], [place] [id number]  Rush Farmer MD NI:[id number]   Resulting Agency  [facility]         Specimen Collected: 11/01/20 13:51 Last Resulted: 11/02/20 06:37      Lab Flowsheet    Order Details    View Encounter    Lab and Collection Details    Routing    Result History    View Encounter Conversation      CM=Additional comments  R=Reference range differs from displayed range       Result Care Coordination    Patient Communication   Add Comments   Seen Back to Top       Satisfied Health Maintenance Topics     Back to Top HEMOGLOBIN A1C (Every 6 Months)  Next due on 05/03/2021  Address Topic          Contains abnormal data Kappa/lambda light chains Order: [id number] Status: Final result    Visible to patient: Yes (seen)    Next appt: 12/13/2020 at 01:00 PM in Oncology ([facility])    Dx: Normocytic anemia    0 Result Notes   Component Ref Range & Units 6 d ago    Kappa free light chain 3.3 - 19.4 mg/L 30.7 High    Lamda free light chains 5.7 - 26.3 mg/L 22.0   Kappa, lamda light chain ratio 0.26 - 1.65 1.40   Comment: (NOTE)  Performed At: [facility]  [place], [place] [id number]  Rush Farmer MD OF:[id number]   Resulting Agency  [facility]          Specimen Collected: 11/01/20 13:51 Last Resulted: 11/04/20 15:36      Lab Flowsheet     Order Details     View Encounter     Lab and Collection Details     Routing     Result History     View Encounter Conversation         Result Care Coordination    Patient Communication   Add Comments   Seen Back to Top          Iron and TIBC Order: [id number] Status: Final result    Visible to patient: Yes (seen)    Next appt: 12/13/2020 at 01:00 PM in Oncology ([facility])    Dx: Iron deficiency anemia due to chronic...    0 Result Notes   Component Ref Range & Units 6 d ago  (11/01/20) 5 mo ago  (05/16/20) 6 mo ago  (04/15/20) 10 mo ago  (01/08/20) 1 yr ago  (07/12/19) 1 yr ago  (01/02/19) 2 yr ago  (07/12/18)  Iron 45 - 182 ug/dL 92  112  104  70  143  87  40 Low    TIBC 250 - 450 ug/dL 319  318  308  377  337  329  393   Saturation Ratios 17.9 - 39.5 % 29  35  34  19  42 High   26  10 Low    UIBC ug/dL 227  206 CM  204 CM  307 CM  194 CM  242 CM  353 CM   Comment: Performed at [facility], [address] [place], [place] [address]  Resulting Agency  [facility] [place] [facility] [place] [facility] [place] [facility] [place] [facility] [place] [facility] [place] [facility]          Specimen Collected: 11/01/20 12:46 Last Resulted: 11/01/20 14:25      Lab Flowsheet     Order Details     View Encounter     Lab and Collection Details     Routing     Result History     View Encounter Conversation      CM=Additional comments       Result Care Coordination    Patient Communication   Add Comments   Seen Back to Top          Ferritin Order:  [id number] Status: Final result    Visible to patient: Yes (seen)    Next appt: 12/13/2020 at 01:00 PM in Oncology ([facility])    Dx: Iron deficiency anemia due to chronic...    0 Result Notes   Component Ref Range & Units 6 d ago  (11/01/20) 5 mo ago  (05/16/20) 6 mo ago  (04/15/20) 10 mo ago  (01/08/20) 1 yr ago  (07/12/19) 1 yr ago  (01/02/19) 2 yr ago  (07/12/18)  Ferritin 24 - 336 ng/mL 80  103 CM  250 CM  22 Low  CM  15 Low  CM  22 Low  CM  11 Low  CM   Comment: Performed at [facility], [address] [place], [place] [address]  Resulting Agency  [facility] [place] [facility] [place] [facility] [place] [facility] [place] [facility] [place] [facility] [place] [facility]          Specimen Collected: 11/01/20 12:46 Last Resulted: 11/01/20 14:25      Lab Flowsheet     Order Details     View Encounter     Lab and Collection Details     Routing     Result History     View Encounter Conversation      CM=Additional comments       Result Care Coordination    Patient Communication   Add Comments   Seen Back to Top           Contains abnormal data Basic metabolic panel Order: [id number] Status: Final result    Visible to patient: Yes (seen)    Next appt: 12/13/2020 at 01:00 PM in Oncology ([facility])    Dx: Iron deficiency anemia due to chronic...    0 Result Notes   Component Ref Range & Units 6 d ago  (11/01/20) 3 mo ago  (08/02/20) 4 mo ago  (06/12/20) 5 mo ago  (05/16/20) 6 mo ago  (04/15/20) 9 mo ago  (01/30/20) 9 mo ago  (01/24/20)  Sodium 135 - 145 mmol/L 134 Low   136  136  134 Low   136  137  140   Potassium 3.5 - 5.1 mmol/L 5.0  4.8  5.0  4.8  4.8  4.8  5.3 High    Chloride 98 - 111 mmol/L 101  104  100  101  100  103  107   CO2 22 - 32 mmol/L 23  24  26  23  24  25  27    Glucose, Bld 70 - 99 mg/dL 313 High   252 High  CM  309 High  CM  316 High  CM  258 High  CM  196 High  CM  137 High  CM   Comment: Glucose reference range applies only to samples taken after  fasting for at least 8 hours.  BUN 8 - 23 mg/dL 26 High   21  19  29  High   23  24 High   16   Creatinine, Ser 0.61 - 1.24 mg/dL 1.70 High   1.49 High   1.67 High   1.79 High   1.49 High   1.29 High   1.36 High    Calcium 8.9 - 10.3 mg/dL 8.9  9.0  8.8 Low   8.7 Low   9.1  8.7 Low   9.2   GFR, Estimated >60 mL/min 42 Low   49 Low  CM  43 Low  CM  39 Low  CM  49 Low  CM     Comment: (NOTE)  Calculated using the CKD-EPI Creatinine Equation (2021)   Anion gap 5 - 15 10  8  CM  10 CM  10 CM  12 CM  9 CM  6 CM   Comment: Performed at [facility], [address] [place], [place] [address]  Resulting Agency  [place] [facility] [place] [facility] [place] [facility] [place] [facility] [place] [facility] [place] [facility] [place] [facility]          Specimen Collected: 11/01/20 12:46 Last Resulted: 11/01/20 13:06      Lab Flowsheet     Order Details     View Encounter     Lab and Collection Details     Routing     Result History     View Encounter Conversation      CM=Additional comments       Result Care Coordination    Patient Communication   Add Comments   Seen Back to Top           Contains abnormal data CBC with Differential Order: [id number] Status: Final result    Visible to patient: Yes (seen)    Next appt: 12/13/2020 at 01:00 PM in Oncology ([facility])    Dx: Iron deficiency anemia due to chronic...    0 Result Notes   Component Ref Range & Units 6 d ago  (11/01/20) 1 mo ago  (09/13/20) 2 mo ago  (08/12/20) 3 mo ago  (08/02/20) 3 mo ago  (07/16/20) 4 mo ago  (06/12/20) 5 mo ago  (05/16/20)  WBC 4.0 - 10.5 K/uL 3.6 Low   2.9 Low   3.3 Low   3.3 Low   3.9 Low   3.5 Low   4.1   RBC 4.22 - 5.81 MIL/uL 3.83 Low   3.60 Low   3.49 Low   3.59 Low   2.72 Low   3.79 Low   3.34 Low    Hemoglobin 13.0 - 17.0 g/dL 12.4 Low   11.9 Low   11.6 Low   12.2 Low   9.1 Low   12.3 Low   11.0 Low    HCT 39.0 - 52.0 % 38.0 Low   36.9 Low   36.0 Low   38.3 Low   28.1 Low   39.0  34.4 Low    MCV 80.0 - 100.0 fL 99.2  102.5 High   103.2 High   106.7 High   103.3 High   102.9 High   103.0 High    MCH 26.0 - 34.0 pg 32.4  33.1  33.2  34.0  33.5  32.5  32.9   MCHC 30.0 - 36.0 g/dL 32.6  32.2  32.2  31.9  32.4  31.5  32.0   RDW 11.5 - 15.5 % 12.8  13.1  13.5  14.6  14.0  12.7  13.8   Platelets 150 - 400 K/uL 162  184  188  186  164  157  194   nRBC 0.0 - 0.2 % 0.0  0.0  0.0  0.0  0.0  0.0  0.0   Neutrophils Relative % % 67  67  66  67  71  67  71   Neutro Abs 1.7 - 7.7 K/uL 2.4  1.9  2.2  2.2  2.7  2.3  2.9   Lymphocytes Relative % 18  17  19  18  16  15  15    Lymphs Abs 0.7 - 4.0 K/uL 0.6 Low   0.5 Low   0.6 Low   0.6 Low   0.6 Low   0.5 Low   0.6 Low    Monocytes Relative % 12  12  12  11  10  17  10    Monocytes Absolute 0.1 - 1.0 K/uL 0.4  0.3  0.4  0.4  0.4  0.6  0.4   Eosinophils Relative % 2  3  2  3  2  1  3    Eosinophils Absolute 0.0 - 0.5 K/uL 0.1  0.1  0.1  0.1  0.1  0.0  0.1   Basophils Relative % 1  1  1  1  1   0  1   Basophils Absolute 0.0 - 0.1 K/uL 0.0  0.0  0.0  0.0  0.0  0.0  0.0   Immature Granulocytes % 0  0  0  0  0  0  0   Abs Immature Granulocytes 0.00 - 0.07 K/uL 0.01  0.01 CM  0.00 CM  0.00 CM  0.01 CM  0.01 CM  0.01 CM   Comment: Performed at [facility], [address] [place], [place] [address]  Resulting Agency  [facility] [place] [facility] [place] [facility] [place] [facility] [place] [facility] [place] [facility] [place] [facility]          Specimen Collected: 11/01/20 12:46 Last Resulted: 11/01/20 13:03

## 2020-11-08 ENCOUNTER — Telehealth: Payer: Self-pay | Admitting: Internal Medicine

## 2020-11-08 ENCOUNTER — Encounter: Payer: Self-pay | Admitting: Internal Medicine

## 2020-11-08 NOTE — Telephone Encounter (Signed)
Dr. B patient would like to discuss his test results with you

## 2020-11-08 NOTE — Telephone Encounter (Signed)
On 6/17- spoke to pt re: negative results of MM work up. Follow upas planned. GB

## 2020-11-11 ENCOUNTER — Other Ambulatory Visit: Payer: Self-pay | Admitting: Cardiovascular Disease

## 2020-11-11 NOTE — Telephone Encounter (Signed)
Md spoke with patient - see phone note

## 2020-11-14 DIAGNOSIS — C4401 Basal cell carcinoma of skin of lip: Secondary | ICD-10-CM | POA: Diagnosis not present

## 2020-11-14 DIAGNOSIS — C44519 Basal cell carcinoma of skin of other part of trunk: Secondary | ICD-10-CM | POA: Diagnosis not present

## 2020-11-15 ENCOUNTER — Telehealth: Payer: Self-pay

## 2020-11-15 ENCOUNTER — Telehealth: Payer: Self-pay | Admitting: Internal Medicine

## 2020-11-15 NOTE — Telephone Encounter (Signed)
[facility] Pre-operative Risk Assessment     Request for surgical clearance:     Endoscopy Procedure  What type of surgery is being performed?     EGD  When is this surgery scheduled?     11/21/20  What type of clearance is required ?   Pharmacy  Are there any medications that need to be held prior to surgery and how long? Eliquis, 2 days  Practice name and name of physician performing surgery?      [facility]  What is your office phone and fax number?      Phone- [phone]  [phone]  Anesthesia type (None, local, MAC, general) ?       MAC   Patient said he was switched  to eliquis 3 weeks ago, thanks for your help

## 2020-11-15 NOTE — Telephone Encounter (Signed)
   Primary Cardiologist: Kathlyn Sacramento, MD  Clinical pharmacist have reviewed  Seth Hardy's medications and past medical history.  They provided the following recommendations.    Patient with diagnosis of afib on Eliquis for anticoagulation.     Procedure: EGD Date of procedure: 11/21/2020   CHA2DS2-VASc Score = 4  This indicates a 4.8% annual risk of stroke. [name] score is based upon: CHF History: No HTN History: Yes Diabetes History: Yes Stroke History: No Vascular Disease History: Yes Age Score: 1 Gender Score: 0     CrCl 36.8 ml/min Platelet count 162   Per office protocol, patient can hold Eliquis for 2 days prior to procedure.  I will route this recommendation to the requesting party via Epic fax function and remove from pre-op pool.  Please call with questions.  [name]. Eudell Julian NP-C    11/15/2020, 11:02 AM [place] [address] Office [phone] Fax [phone]

## 2020-11-15 NOTE — Telephone Encounter (Signed)
Patient calling wants to know when to stop taking Elquis. Pt procedure sched for 06/30.Marland Kitchen Plz advise.. Thanks

## 2020-11-15 NOTE — Telephone Encounter (Signed)
Patient with diagnosis of afib on Eliquis for anticoagulation.    Procedure: EGD Date of procedure: 11/21/2020  CHA2DS2-VASc Score = 4  This indicates a 4.8% annual risk of stroke. [name] score is based upon: CHF History: No HTN History: Yes Diabetes History: Yes Stroke History: No Vascular Disease History: Yes Age Score: 1 Gender Score: 0     CrCl 36.8 ml/min Platelet count 162  Per office protocol, patient can hold Eliquis for 2 days prior to procedure.

## 2020-11-15 NOTE — Telephone Encounter (Signed)
I informed Seth Hardy that per Heart care he can hold his Eliquis for 2 days prior to his procedure. He verbalized understanding.

## 2020-11-18 ENCOUNTER — Encounter (HOSPITAL_COMMUNITY): Payer: Self-pay | Admitting: Internal Medicine

## 2020-11-18 ENCOUNTER — Other Ambulatory Visit: Payer: Self-pay

## 2020-11-18 ENCOUNTER — Other Ambulatory Visit (HOSPITAL_COMMUNITY)
Admission: RE | Admit: 2020-11-18 | Discharge: 2020-11-18 | Disposition: A | Discharge status: Home / Self Care | Payer: HMO | Source: Ambulatory Visit | Attending: Internal Medicine | Admitting: Internal Medicine

## 2020-11-18 DIAGNOSIS — Z01812 Encounter for preprocedural laboratory examination: Secondary | ICD-10-CM | POA: Insufficient documentation

## 2020-11-18 DIAGNOSIS — Z20822 Contact with and (suspected) exposure to covid-19: Secondary | ICD-10-CM | POA: Insufficient documentation

## 2020-11-19 LAB — SARS CORONAVIRUS 2 (TAT 6-24 HRS): SARS Coronavirus 2: NEGATIVE

## 2020-11-20 DIAGNOSIS — G4733 Obstructive sleep apnea (adult) (pediatric): Secondary | ICD-10-CM | POA: Diagnosis not present

## 2020-11-21 ENCOUNTER — Ambulatory Visit (HOSPITAL_COMMUNITY)
Admission: RE | Admit: 2020-11-21 | Discharge: 2020-11-21 | Disposition: A | Discharge status: Home / Self Care | Payer: HMO | Attending: Internal Medicine | Admitting: Internal Medicine

## 2020-11-21 ENCOUNTER — Ambulatory Visit (HOSPITAL_COMMUNITY): Payer: HMO | Admitting: Certified Registered"

## 2020-11-21 ENCOUNTER — Encounter (HOSPITAL_COMMUNITY)
Admission: RE | Disposition: A | Discharge status: Home / Self Care | Payer: Self-pay | Source: Home / Self Care | Attending: Internal Medicine

## 2020-11-21 ENCOUNTER — Other Ambulatory Visit: Payer: Self-pay

## 2020-11-21 ENCOUNTER — Encounter (HOSPITAL_COMMUNITY): Payer: Self-pay | Admitting: Internal Medicine

## 2020-11-21 DIAGNOSIS — E1122 Type 2 diabetes mellitus with diabetic chronic kidney disease: Secondary | ICD-10-CM | POA: Insufficient documentation

## 2020-11-21 DIAGNOSIS — Z841 Family history of disorders of kidney and ureter: Secondary | ICD-10-CM | POA: Diagnosis not present

## 2020-11-21 DIAGNOSIS — Z794 Long term (current) use of insulin: Secondary | ICD-10-CM | POA: Insufficient documentation

## 2020-11-21 DIAGNOSIS — Z79899 Other long term (current) drug therapy: Secondary | ICD-10-CM | POA: Diagnosis not present

## 2020-11-21 DIAGNOSIS — K219 Gastro-esophageal reflux disease without esophagitis: Secondary | ICD-10-CM | POA: Diagnosis not present

## 2020-11-21 DIAGNOSIS — K31819 Angiodysplasia of stomach and duodenum without bleeding: Secondary | ICD-10-CM | POA: Insufficient documentation

## 2020-11-21 DIAGNOSIS — I129 Hypertensive chronic kidney disease with stage 1 through stage 4 chronic kidney disease, or unspecified chronic kidney disease: Secondary | ICD-10-CM | POA: Insufficient documentation

## 2020-11-21 DIAGNOSIS — I480 Paroxysmal atrial fibrillation: Secondary | ICD-10-CM | POA: Insufficient documentation

## 2020-11-21 DIAGNOSIS — Z801 Family history of malignant neoplasm of trachea, bronchus and lung: Secondary | ICD-10-CM | POA: Diagnosis not present

## 2020-11-21 DIAGNOSIS — K31811 Angiodysplasia of stomach and duodenum with bleeding: Secondary | ICD-10-CM | POA: Diagnosis not present

## 2020-11-21 DIAGNOSIS — Z951 Presence of aortocoronary bypass graft: Secondary | ICD-10-CM | POA: Diagnosis not present

## 2020-11-21 DIAGNOSIS — D500 Iron deficiency anemia secondary to blood loss (chronic): Secondary | ICD-10-CM | POA: Diagnosis not present

## 2020-11-21 DIAGNOSIS — Z8249 Family history of ischemic heart disease and other diseases of the circulatory system: Secondary | ICD-10-CM | POA: Insufficient documentation

## 2020-11-21 DIAGNOSIS — Z825 Family history of asthma and other chronic lower respiratory diseases: Secondary | ICD-10-CM | POA: Diagnosis not present

## 2020-11-21 DIAGNOSIS — K317 Polyp of stomach and duodenum: Secondary | ICD-10-CM | POA: Diagnosis not present

## 2020-11-21 DIAGNOSIS — Z7982 Long term (current) use of aspirin: Secondary | ICD-10-CM | POA: Diagnosis not present

## 2020-11-21 DIAGNOSIS — Z7901 Long term (current) use of anticoagulants: Secondary | ICD-10-CM | POA: Diagnosis not present

## 2020-11-21 DIAGNOSIS — Z950 Presence of cardiac pacemaker: Secondary | ICD-10-CM | POA: Insufficient documentation

## 2020-11-21 DIAGNOSIS — Z955 Presence of coronary angioplasty implant and graft: Secondary | ICD-10-CM | POA: Insufficient documentation

## 2020-11-21 DIAGNOSIS — Z800 Family history of malignant neoplasm of digestive organs: Secondary | ICD-10-CM | POA: Diagnosis not present

## 2020-11-21 DIAGNOSIS — N189 Chronic kidney disease, unspecified: Secondary | ICD-10-CM | POA: Insufficient documentation

## 2020-11-21 DIAGNOSIS — E785 Hyperlipidemia, unspecified: Secondary | ICD-10-CM | POA: Diagnosis not present

## 2020-11-21 HISTORY — PX: ESOPHAGOGASTRODUODENOSCOPY (EGD) WITH PROPOFOL: SHX5813

## 2020-11-21 HISTORY — PX: GASTRIC VARICES BANDING: SHX5519

## 2020-11-21 LAB — GLUCOSE, CAPILLARY: Glucose-Capillary: 178 mg/dL — ABNORMAL HIGH (ref 70–99)

## 2020-11-21 SURGERY — ESOPHAGOGASTRODUODENOSCOPY (EGD) WITH PROPOFOL
Anesthesia: Monitor Anesthesia Care

## 2020-11-21 MED ORDER — PROPOFOL 500 MG/50ML IV EMUL
INTRAVENOUS | Status: DC | PRN
  Administered 2020-11-21: 150 ug/kg/min via INTRAVENOUS

## 2020-11-21 MED ORDER — PROPOFOL 1000 MG/100ML IV EMUL
INTRAVENOUS | Status: AC
  Filled 2020-11-21: qty 200

## 2020-11-21 MED ORDER — SODIUM CHLORIDE 0.9 % IV SOLN: INTRAVENOUS | Status: DC

## 2020-11-21 MED ORDER — LACTATED RINGERS IV SOLN: INTRAVENOUS | Status: DC | PRN

## 2020-11-21 MED ORDER — LIDOCAINE HCL (CARDIAC) PF 100 MG/5ML IV SOSY
PREFILLED_SYRINGE | INTRAVENOUS | Status: DC | PRN
  Administered 2020-11-21: 80 mg via INTRAVENOUS

## 2020-11-21 MED ORDER — PROPOFOL 10 MG/ML IV BOLUS
INTRAVENOUS | Status: DC | PRN
  Administered 2020-11-21: 20 mg via INTRAVENOUS

## 2020-11-21 SURGICAL SUPPLY — 15 items

## 2020-11-21 NOTE — H&P (Signed)
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 Hgb has improved since last Tx   Past Medical History:  Diagnosis Date   Basal cell carcinoma of skin 2012   removed several spots from arms and back   CAD (coronary artery disease)    a. 01/2010 CABG x 4: LIMA->LAD, VG->D1, VG->OM1, VG->PDA. b. NSTEMI 02/2012 in setting of AF-RVR with cath s/p BMS to RCA (plan for 1 month, possibly up to 3 months of Plavix)   Carotid disease, bilateral ([place])    a. 03/2011 U/S: 40-59% bilat Carotid dzs;  b. 04/2015 Carotid U/S: 40-59% bilat ICA stenosis; c. 05/2015 CTA Neck: 94% RICA, 49% LICA, mod-marked R vertebral stenosis, mod L vertebral stenosis.   Chronic kidney disease    small obtusion per pcp. ultrasound done on 08/29/2015   Diabetes mellitus type II, controlled ([place])    a. Variable CBG 02/2012 - several meds adjusted.   Dysrhythmia    intermittent Atrial Fibrillation   GERD (gastroesophageal reflux disease)    Heart murmur    Hyperlipidemia    Hypertension    Hypertensive heart disease    Iron deficiency anemia    "gets infusions q once in awhile" (04/27/2018)   Iron deficiency anemia 08/2015   received 2 units rbc one week ago, 3 IV iron infusion -last 1 week.   Macular edema bil   lazer work done previously   Mild aortic stenosis    a. 03/2011 Echo: EF 55-60%, Mild AS. b. Mild by cath 02/2012; c. 01/2014 Echo: EF 65-70%, Gr 1 DD, mild AS, mildly dil LA.   Multiple gastric ulcers 2006   Myocardial infarction [facility]) 02/2012   stents (x1) at time   Myocardial infarction [facility])    "2nd one was after 02/2012; don't know date" (04/27/2018)   Obesity    On home oxygen therapy    "2.5 w/CPAP" (04/27/2018) history of   OSA on CPAP    CPAP settings 3 with oxygen 2.5  (04/27/2018)   PAF (paroxysmal atrial fibrillation) ([place])    a. Newly dx 02/2012 & initiated on Coumadin (spont converted to NSR).   Presence of permanent cardiac pacemaker    Shortness of breath dyspnea    Transfusion history    transfusions -1 month ago -2 units    Past Surgical History:  Procedure Laterality Date   ANGIOPLASTY     BASAL CELL CARCINOMA EXCISION     "shoulder, arm X 2"   BIOPSY  09/02/2020   Procedure: BIOPSY;  Surgeon: Gatha Mayer, MD;  Location: [facility] ENDOSCOPY;  Service: Endoscopy;;   CARDIAC CATHETERIZATION  2014   CARDIAC CATHETERIZATION  2021   [facility] x1 stent   CATARACT EXTRACTION W/ INTRAOCULAR LENS  IMPLANT, BILATERAL Bilateral 2016   COLONOSCOPY WITH PROPOFOL N/A 10/15/2015   Procedure: COLONOSCOPY WITH PROPOFOL;  Surgeon: Gatha Mayer, MD;  Location: [facility] ENDOSCOPY;  Service: Endoscopy;  Laterality: N/A;   CORONARY ANGIOPLASTY     Dr. Fletcher Anon   CORONARY ARTERY BYPASS GRAFT  2011   CABG X3-4; [facility], [place], [place],    CORONARY ATHERECTOMY  04/27/2018   CORONARY ATHERECTOMY N/A 04/27/2018   Procedure: CORONARY ATHERECTOMY;  Surgeon:  Belva Crome, MD;  Location: [place] CV LAB;  Service: Cardiovascular;  Laterality: N/A;   cyst L kidney  [place] Left 08/30/2015   Procedure: ENDARTERECTOMY CAROTID;  Surgeon: Katha Cabal, MD;  Location: [facility] ORS;  Service: Vascular;  Laterality: Left;   ESOPHAGOGASTRODUODENOSCOPY (EGD) WITH PROPOFOL N/A 10/15/2015   Procedure: ESOPHAGOGASTRODUODENOSCOPY (EGD) WITH PROPOFOL;  Surgeon: Gatha Mayer, MD;  Location: [facility];  Service: Endoscopy;  Laterality: N/A;   ESOPHAGOGASTRODUODENOSCOPY (EGD) WITH PROPOFOL N/A 01/12/2020   Procedure: ESOPHAGOGASTRODUODENOSCOPY (EGD) WITH PROPOFOL;  Surgeon: Jerene Bears, MD;  Location: [facility] ENDOSCOPY;  Service: Gastroenterology;  Laterality: N/A;   ESOPHAGOGASTRODUODENOSCOPY (EGD) WITH PROPOFOL N/A 01/15/2020   Procedure:  ESOPHAGOGASTRODUODENOSCOPY (EGD) WITH PROPOFOL;  Surgeon: Ladene Artist, MD;  Location: [facility] ENDOSCOPY;  Service: Endoscopy;  Laterality: N/A;   ESOPHAGOGASTRODUODENOSCOPY (EGD) WITH PROPOFOL N/A 09/02/2020   Procedure: ESOPHAGOGASTRODUODENOSCOPY (EGD) WITH PROPOFOL;  Surgeon: Gatha Mayer, MD;  Location: [facility];  Service: Endoscopy;  Laterality: N/A;  with APC   EYE SURGERY Bilateral    "has macular edema cauterized" (04/27/2018)   GI RADIOFREQUENCY ABLATION  09/02/2020   Procedure: GI RADIOFREQUENCY ABLATION;  Surgeon: Gatha Mayer, MD;  Location: [facility] ENDOS[facility];  Service: Endoscopy;;   HEMOSTASIS CLIP PLACEMENT  01/15/2020   Procedure: HEMOSTASIS CLIP PLACEMENT;  Surgeon: Ladene Artist, MD;  Location: [place];  Service: Endoscopy;;   HOT HEMOSTASIS N/A 01/12/2020   Procedure: HOT HEMOSTASIS (ARGON PLASMA COAGULATION/BICAP);  Surgeon: Jerene Bears, MD;  Location: [facility] ENDOSCOPY;  Service: Gastroenterology;  Laterality: N/A;   HOT HEMOSTASIS N/A 01/15/2020   Procedure: HOT HEMOSTASIS (ARGON PLASMA COAGULATION/BICAP);  Surgeon: Ladene Artist, MD;  Location: [facility] ENDOSCOPY;  Service: Endoscopy;  Laterality: N/A;   INGUINAL HERNIA REPAIR Right    LEFT HEART CATHETERIZATION WITH CORONARY ANGIOGRAM N/A 03/09/2012   Procedure: LEFT HEART CATHETERIZATION WITH CORONARY ANGIOGRAM;  Surgeon: Wellington Hampshire, MD;  Location: [place] CATH LAB;  Service: Cardiovascular;  Laterality: N/A;   PACEMAKER IMPLANT N/A 01/26/2020   Procedure: PACEMAKER IMPLANT;  Surgeon: Vickie Epley, MD;  Location: [place] CV LAB;  Service: Cardiovascular;  Laterality: N/A;   PERCUTANEOUS CORONARY STENT INTERVENTION (PCI-S)  03/09/2012   Procedure: PERCUTANEOUS CORONARY STENT INTERVENTION (PCI-S);  Surgeon: Wellington Hampshire, MD;  Location: [facility] CATH LAB;  Service: Cardiovascular;;   POLYPECTOMY  01/15/2020   Procedure: POLYPECTOMY;  Surgeon: Ladene Artist, MD;  Location: [place];  Service: Endoscopy;;    RIGHT/LEFT HEART CATH AND CORONARY ANGIOGRAPHY N/A 04/25/2018   Procedure: RIGHT/LEFT HEART CATH AND CORONARY ANGIOGRAPHY;  Surgeon: Wellington Hampshire, MD;  Location: [place] CV LAB;  Service: Cardiovascular;  Laterality: N/A;    Prior to Admission medications   Medication Sig Start Date End Date Taking? Authorizing Provider  ALPRAZolam Duanne Moron) 0.25 MG tablet Take 0.25 mg by mouth daily as needed for anxiety.   Yes [provider]  amiodarone (PACERONE) 200 MG tablet Take 200 mg by mouth daily.   Yes [provider]  amLODipine (NORVASC) 5 MG tablet Take one tablet by mouth one time daily Patient taking differently: Take 5 mg by mouth daily. Take one tablet by mouth one time daily 07/25/14  Yes Wellington Hampshire, MD  apixaban (ELIQUIS) 5 MG TABS tablet Take 1 tablet (5 mg total) by mouth 2 (two) times daily. 09/10/20  Yes Wellington Hampshire, MD  aspirin EC 81 MG EC tablet Take  1 tablet (81 mg total) by mouth daily. Swallow whole. 01/17/20  Yes Vashti Hey, MD  Cyanocobalamin (B-12) 1000 MCG CAPS Take 1,000 mcg by mouth every evening.    Yes [provider]  Ferrous Gluconate-C-Folic Acid (IRON-C PO) Take 1 tablet by mouth daily.   Yes [provider]  furosemide (LASIX) 20 MG tablet Take 20 mg by mouth daily as needed for edema.   Yes [provider]  glimepiride (AMARYL) 4 MG tablet Take 4 mg by mouth daily.   Yes [provider]  insulin glargine (LANTUS) 100 UNIT/ML injection Inject 15 Units into the skin at bedtime.   Yes [provider]  isosorbide mononitrate (IMDUR) 30 MG 24 hr tablet TAKE 1 TABLET BY MOUTH DAILY. Patient taking differently: Take 30 mg by mouth daily. 10/08/20  Yes Wellington Hampshire, MD  metoprolol tartrate (LOPRESSOR) 25 MG tablet Take 1 tablet (25 mg) by mouth twice daily as needed for palpitations 02/09/20  Yes Dunn, Ryan M, PA-C  NOVOLOG FLEXPEN 100 UNIT/ML FlexPen Inject 3-30 Units into the  skin 3 (three) times daily with meals. 05/26/17  Yes [provider]  Omega-3 Fatty Acids (FISH OIL) 1000 MG CAPS Take 1,000 mg by mouth every evening.   Yes [provider]  pantoprazole (PROTONIX) 40 MG tablet Take 1 tablet (40 mg total) by mouth daily. 12/28/19 03/08/29 Yes Para Skeans, MD  Polyethylene Glycol 400 (BLINK TEARS OP) Place 1 drop into both eyes daily as needed (dry eyes).   Yes [provider]  ramipril (ALTACE) 10 MG capsule Take 10 mg by mouth daily.  09/17/15  Yes [provider]  rosuvastatin (CRESTOR) 40 MG tablet TAKE ONE TABLET BY MOUTH EVERY DAY Patient taking differently: Take 40 mg by mouth daily. 11/11/20  Yes Wellington Hampshire, MD  sucralfate (CARAFATE) 1 g tablet Take 1 g by mouth 2 (two) times daily. 08/23/19  Yes [provider]  vitamin C (ASCORBIC ACID) 500 MG tablet Take 500 mg by mouth every evening.   Yes [provider]    Current Facility-Administered Medications  Medication Dose Route Frequency Provider Last Rate Last Admin   0.9 %  sodium chloride infusion   Intravenous Continuous Gatha Mayer, MD        Allergies as of 09/09/2020   (No Known Allergies)    Family History  Problem Relation Age of Onset   COPD Mother        alive 64   Bladder Cancer Mother    Heart attack Father        64 deceased   Lung cancer Maternal Aunt    Liver cancer Maternal Aunt    Kidney disease Paternal Uncle    Cancer Other        all paternal aunts and uncles   Prostate cancer Neg Hx    Kidney cancer Neg Hx     Social History   Socioeconomic History   Marital status: Married    Spouse name: Pope   Number of children: 2   Years of education: Not on file   Highest education level: Not on file  Occupational History   Occupation: retired  Tobacco Use   Smoking status: Never   Smokeless tobacco: Never   Tobacco comments:    tobacco use - no.no passive smoke in home  Vaping Use   Vaping Use: Never  used  Substance and Sexual Activity   Alcohol use: Yes  Comment: 04/27/2018 "couple drinks/year; if that"   Drug use: Never   Sexual activity: Not Currently  Other Topics Concern   Not on file  Social History Narrative   Full time. Gets regular exercise. Lives in [place] with his wife.  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   Social Determinants of Radio broadcast assistant Strain: Not on file  Food Insecurity: Not on file  Transportation Needs: Not on file  Physical Activity: Not on file  Stress: Not on file  Social Connections: Not on file  Intimate Partner Violence: Not on file    Review of Systems:  All other review of systems negative except as mentioned in the HPI.  Physical Exam: Vital signs in last 24 hours: Temp:  [98 F (36.7 C)] 98 F (36.7 C) (06/30 0718) Pulse Rate:  [60] 60 (06/30 0718) Resp:  [20] 20 (06/30 0718) BP: (177)/(57) 177/57 (06/30 0718) SpO2:  [99 %] 99 % (06/30 0718)   General:   Alert,  Well-developed, well-nourished, pleasant and cooperative in NAD Lungs:  Clear throughout to auscultation.   Heart:  Regular rate and rhythm; no murmurs, clicks, rubs,  or gallops. Abdomen:  Soft, nontender and nondistended. Normal bowel sounds.   Neuro/Psych:  Alert and cooperative. Normal mood and affect. A and O x 3   @Macala Baldonado  Simonne Maffucci, MD, [facility]astroenterology [phone] (pager) 11/21/2020 7:33 AM@

## 2020-11-21 NOTE — Op Note (Addendum)
[facility] Patient Name: Seth Hardy Procedure Date: 11/21/2020 MRN: [id number] Attending MD: Gatha Mayer , MD Date of Birth: [birth year] CSN: [id number] Age: 75 Admit Type: Outpatient Procedure:                Upper GI endoscopy Indications:              Watermelon stomach (GAVE syndrome) Providers:                Gatha Mayer, MD, Burtis Junes, RN, Tyna Jaksch                            Technician Referring MD:             Rusty Aus, MD Medicines:                Propofol per Anesthesia, Monitored Anesthesia Care Complications:            No immediate complications. Estimated Blood Loss:     Estimated blood loss was minimal. Procedure:                Pre-Anesthesia Assessment:                           - Prior to the procedure, a History and Physical                            was performed, and patient medications and                            allergies were reviewed. [name] tolerance of                            previous anesthesia was also reviewed. The risks                            and benefits of the procedure and the sedation                            options and risks were discussed with the patient.                            All questions were answered, and informed consent                            was obtained. Prior Anticoagulants: The patient                            last took Eliquis (apixaban) 2 days prior to the                            procedure. ASA Grade Assessment: III - A patient                            with severe systemic disease. After reviewing the  risks and benefits, the patient was deemed in                            satisfactory condition to undergo the procedure.                           After obtaining informed consent, the endoscope was                            passed under direct vision. Throughout the                            procedure, the patient's blood pressure,  pulse, and                            oxygen saturations were monitored continuously. The                            GIF-H190 ([id number]) Olympus gastroscope was                            introduced through the mouth, and advanced to the                            second part of duodenum. The upper GI endoscopy was                            accomplished without difficulty. The patient                            tolerated the procedure well. 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 Four bands       were successfully placed. Verification of patient identification for the       specimen was done. Estimated blood loss was minimal.      A single diminutive sessile polyp with stigmata of recent bleeding was       found in the cardia. One band was successfully placed. Verification of       patient identification for the specimen was done. Estimated blood loss       was minimal.      The exam was otherwise without abnormality.      The cardia and gastric fundus were normal on retroflexion. Impression:               - Gastric antral vascular ectasia. Banded.                           - A single gastric polyp. Banded.                           - The examination was otherwise normal.                           - No specimens collected. Moderate Sedation:      Not Applicable - Patient had  care per Anesthesia. Recommendation:           - Patient has a contact number available for                            emergencies. The signs and symptoms of potential                            delayed complications were discussed with the                            patient. Return to normal activities tomorrow.                            Written discharge instructions were provided to the                            patient.                           - Resume previous diet.                           - Continue present medications.                            - Resume Eliquis (apixaban) at prior dose tomorrow.                           - See Dr. Sabra Heck around 12/23/2020 for labs - CBC and                            ferritin - Procedure Code(s):        --- Professional ---                           [phone], Esophagogastroduodenoscopy, flexible,                            transoral; with control of bleeding, any method Diagnosis Code(s):        --- Professional ---                           K31.819, Angiodysplasia of stomach and duodenum                            without bleeding                           K31.7, Polyp of stomach and duodenum CPT copyright 2019 American Medical Association. All rights reserved. The codes documented in this report are preliminary and upon coder review may  be revised to meet current compliance requirements. Gatha Mayer, MD 11/21/2020 8:18:14 AM This report has been signed electronically. Number of Addenda: 0

## 2020-11-21 NOTE — Discharge Instructions (Addendum)
There was some residual abnormality, the gave is still there.  I tried a new approach with banding of the tissue.  I will show you the pictures.  My plan is for you to come back in early August and have a blood count done and then we will regroup after that. Will do with Dr. Sabra Heck. Please contact him about this though I will send him a report also  Restart Eliquis tomorrow.  I appreciate the opportunity to care for you. Gatha Mayer, MD, FACG  YOU HAD AN ENDOSCOPIC PROCEDURE TODAY: Refer to the procedure report and other information in the discharge instructions given to you for any specific questions about what was found during the examination. If this information does not answer your questions, please call Dr. Celesta Aver office at [phone] to clarify.   YOU SHOULD EXPECT: Some feelings of bloating in the abdomen. Passage of more gas than usual. Walking can help get rid of the air that was put into your GI tract during the procedure and reduce the bloating. If you had a lower endoscopy (such as a colonoscopy or flexible sigmoidoscopy) you may notice spotting of blood in your stool or on the toilet paper. Some abdominal soreness may be present for a day or two, also.  DIET: Your first meal following the procedure should be a light meal and then it is ok to progress to your normal diet. A half-sandwich or bowl of soup is an example of a good first meal. Heavy or fried foods are harder to digest and may make you feel nauseous or bloated. Drink plenty of fluids but you should avoid alcoholic beverages for 24 hours.   ACTIVITY: Your care partner should take you home directly after the procedure. You should plan to take it easy, moving slowly for the rest of the day. You can resume normal activity the day after the procedure however YOU SHOULD NOT DRIVE, use power tools, machinery or perform tasks that involve climbing or major physical exertion for 24 hours (because of the sedation medicines used  during the test).   SYMPTOMS TO REPORT IMMEDIATELY: A gastroenterologist can be reached at any hour. Please call [phone]  for any of the following symptoms:   Following upper endoscopy (EGD, EUS, ERCP, esophageal dilation) Vomiting of blood or coffee ground material  New, significant abdominal pain  New, significant chest pain or pain under the shoulder blades  Painful or persistently difficult swallowing  New shortness of breath  Black, tarry-looking or red, bloody stools

## 2020-11-21 NOTE — Transfer of Care (Signed)
Immediate Anesthesia Transfer of Care Note  Patient: Seth Hardy  Procedure(s) Performed: ESOPHAGOGASTRODUODENOSCOPY (EGD) WITH PROPOFOL GASTRIC VARICES BANDING  Patient Location: PACU  Anesthesia Type:MAC  Level of Consciousness: drowsy and patient cooperative  Airway & Oxygen Therapy: Patient Spontanous Breathing and Patient connected to face mask oxygen  Post-op Assessment: Report given to RN and Post -op Vital signs reviewed and stable  Post vital signs: Reviewed and stable  Last Vitals:  Vitals Value Taken Time  BP    Temp    Pulse    Resp    SpO2      Last Pain:  Vitals:   11/21/20 0718  TempSrc: Oral  PainSc: 0-No pain         Complications: No notable events documented.

## 2020-11-21 NOTE — Anesthesia Postprocedure Evaluation (Signed)
Anesthesia Post Note  Patient: Seth Hardy  Procedure(s) Performed: ESOPHAGOGASTRODUODENOSCOPY (EGD) WITH PROPOFOL GASTRIC VARICES BANDING     Patient location during evaluation: PACU Anesthesia Type: MAC Level of consciousness: awake and alert Pain management: pain level controlled Vital Signs Assessment: post-procedure vital signs reviewed and stable Respiratory status: spontaneous breathing, nonlabored ventilation, respiratory function stable and patient connected to nasal cannula oxygen Cardiovascular status: stable and blood pressure returned to baseline Postop Assessment: no apparent nausea or vomiting Anesthetic complications: no   No notable events documented.  Last Vitals:  Vitals:   11/21/20 0810 11/21/20 0820  BP: (!) 158/59 (!) 154/60  Pulse: 60 60  Resp: 17 18  Temp:    SpO2: 99% 95%    Last Pain:  Vitals:   11/21/20 0820  TempSrc:   PainSc: 0-No pain                 [name]

## 2020-11-21 NOTE — Anesthesia Preprocedure Evaluation (Signed)
Anesthesia Evaluation  Patient identified by MRN, date of birth, ID band Patient awake    Reviewed: Allergy & Precautions, NPO status , Patient's Chart, lab work & pertinent test results  Airway Mallampati: II  TM Distance: >3 FB Neck ROM: Full    Dental no notable dental hx.    Pulmonary sleep apnea and Continuous Positive Airway Pressure Ventilation ,    Pulmonary exam normal breath sounds clear to auscultation       Cardiovascular hypertension, Pt. on medications + CAD, + Past MI and + Peripheral Vascular Disease  + pacemaker + Valvular Problems/Murmurs AS  Rhythm:Regular Rate:Normal + Systolic murmurs    Neuro/Psych negative neurological ROS  negative psych ROS   GI/Hepatic Neg liver ROS, PUD,   Endo/Other  diabetes  Renal/GU negative Renal ROS  negative genitourinary   Musculoskeletal negative musculoskeletal ROS (+)   Abdominal   Peds negative pediatric ROS (+)  Hematology negative hematology ROS (+)   Anesthesia Other Findings   Reproductive/Obstetrics negative OB ROS                             Anesthesia Physical Anesthesia Plan  ASA: 3  Anesthesia Plan: MAC   Post-op Pain Management:    Induction: Intravenous  PONV Risk Score and Plan: 1 and Propofol infusion and Treatment may vary due to age or medical condition  Airway Management Planned: Simple Face Mask  Additional Equipment:   Intra-op Plan:   Post-operative Plan:   Informed Consent: I have reviewed the patients History and Physical, chart, labs and discussed the procedure including the risks, benefits and alternatives for the proposed anesthesia with the patient or authorized representative who has indicated his/her understanding and acceptance.     Dental advisory given  Plan Discussed with: CRNA and Surgeon  Anesthesia Plan Comments:         Anesthesia Quick Evaluation

## 2020-11-28 ENCOUNTER — Other Ambulatory Visit: Payer: Self-pay | Admitting: Cardiovascular Disease

## 2020-12-06 LAB — CUP PACEART REMOTE DEVICE CHECK
Battery Remaining Longevity: 174 mo
Battery Remaining Percentage: 100 %
Brady Statistic RA Percent Paced: 95 %
Brady Statistic RV Percent Paced: 0 %
Date Time Interrogation Session: 20220714230000
Implantable Lead Implant Date: 20210903
Implantable Lead Implant Date: 20210903
Implantable Lead Location: 753859
Implantable Lead Location: 753860
Implantable Lead Model: 7841
Implantable Lead Model: 7842
Implantable Lead Serial Number: 1053222
Implantable Lead Serial Number: 1093158
Implantable Pulse Generator Implant Date: 20210903
Lead Channel Impedance Value: 517 Ohm
Lead Channel Impedance Value: 641 Ohm
Lead Channel Pacing Threshold Amplitude: 0.6 V
Lead Channel Pacing Threshold Amplitude: 1 V
Lead Channel Pacing Threshold Pulse Width: 0.4 ms
Lead Channel Pacing Threshold Pulse Width: 0.4 ms
Lead Channel Setting Pacing Amplitude: 2 V
Lead Channel Setting Pacing Amplitude: 2.5 V
Lead Channel Setting Pacing Pulse Width: 0.4 ms
Lead Channel Setting Sensing Sensitivity: 2 mV
Pulse Gen Serial Number: 945603

## 2020-12-07 DIAGNOSIS — G4733 Obstructive sleep apnea (adult) (pediatric): Secondary | ICD-10-CM | POA: Diagnosis not present

## 2020-12-09 ENCOUNTER — Other Ambulatory Visit: Payer: Self-pay | Admitting: Cardiovascular Disease

## 2020-12-13 ENCOUNTER — Inpatient Hospital Stay: Payer: HMO

## 2020-12-18 ENCOUNTER — Inpatient Hospital Stay: Payer: HMO | Admitting: Certified Registered Nurse Anesthetist

## 2020-12-18 ENCOUNTER — Encounter: Admission: EM | Disposition: E | Payer: Self-pay | Source: Home / Self Care | Attending: Pulmonary Disease

## 2020-12-18 ENCOUNTER — Other Ambulatory Visit: Payer: Self-pay

## 2020-12-18 ENCOUNTER — Inpatient Hospital Stay
Admission: EM | Admit: 2020-12-18 | Discharge: 2020-12-23 | DRG: 356 | Disposition: E | Payer: HMO | Attending: Pulmonary Disease | Admitting: Pulmonary Disease

## 2020-12-18 ENCOUNTER — Inpatient Hospital Stay: Payer: HMO

## 2020-12-18 ENCOUNTER — Encounter: Payer: Self-pay | Admitting: *Deleted

## 2020-12-18 ENCOUNTER — Emergency Department: Payer: HMO

## 2020-12-18 ENCOUNTER — Inpatient Hospital Stay: Payer: HMO | Attending: Internal Medicine

## 2020-12-18 DIAGNOSIS — N1831 Chronic kidney disease, stage 3a: Secondary | ICD-10-CM | POA: Diagnosis present

## 2020-12-18 DIAGNOSIS — Z7984 Long term (current) use of oral hypoglycemic drugs: Secondary | ICD-10-CM

## 2020-12-18 DIAGNOSIS — R1110 Vomiting, unspecified: Secondary | ICD-10-CM | POA: Diagnosis not present

## 2020-12-18 DIAGNOSIS — Z515 Encounter for palliative care: Secondary | ICD-10-CM | POA: Diagnosis not present

## 2020-12-18 DIAGNOSIS — Z79899 Other long term (current) drug therapy: Secondary | ICD-10-CM

## 2020-12-18 DIAGNOSIS — G4733 Obstructive sleep apnea (adult) (pediatric): Secondary | ICD-10-CM | POA: Diagnosis present

## 2020-12-18 DIAGNOSIS — Z85828 Personal history of other malignant neoplasm of skin: Secondary | ICD-10-CM

## 2020-12-18 DIAGNOSIS — Z7901 Long term (current) use of anticoagulants: Secondary | ICD-10-CM

## 2020-12-18 DIAGNOSIS — E785 Hyperlipidemia, unspecified: Secondary | ICD-10-CM | POA: Diagnosis not present

## 2020-12-18 DIAGNOSIS — Z9981 Dependence on supplemental oxygen: Secondary | ICD-10-CM

## 2020-12-18 DIAGNOSIS — N281 Cyst of kidney, acquired: Secondary | ICD-10-CM | POA: Diagnosis not present

## 2020-12-18 DIAGNOSIS — Z7982 Long term (current) use of aspirin: Secondary | ICD-10-CM

## 2020-12-18 DIAGNOSIS — R404 Transient alteration of awareness: Secondary | ICD-10-CM | POA: Diagnosis not present

## 2020-12-18 DIAGNOSIS — I252 Old myocardial infarction: Secondary | ICD-10-CM

## 2020-12-18 DIAGNOSIS — E1122 Type 2 diabetes mellitus with diabetic chronic kidney disease: Secondary | ICD-10-CM | POA: Diagnosis present

## 2020-12-18 DIAGNOSIS — K31819 Angiodysplasia of stomach and duodenum without bleeding: Secondary | ICD-10-CM

## 2020-12-18 DIAGNOSIS — Z951 Presence of aortocoronary bypass graft: Secondary | ICD-10-CM

## 2020-12-18 DIAGNOSIS — I10 Essential (primary) hypertension: Secondary | ICD-10-CM | POA: Diagnosis not present

## 2020-12-18 DIAGNOSIS — I480 Paroxysmal atrial fibrillation: Secondary | ICD-10-CM | POA: Diagnosis not present

## 2020-12-18 DIAGNOSIS — R61 Generalized hyperhidrosis: Secondary | ICD-10-CM | POA: Diagnosis not present

## 2020-12-18 DIAGNOSIS — K922 Gastrointestinal hemorrhage, unspecified: Secondary | ICD-10-CM

## 2020-12-18 DIAGNOSIS — E1165 Type 2 diabetes mellitus with hyperglycemia: Secondary | ICD-10-CM | POA: Diagnosis present

## 2020-12-18 DIAGNOSIS — Z452 Encounter for adjustment and management of vascular access device: Secondary | ICD-10-CM

## 2020-12-18 DIAGNOSIS — Z950 Presence of cardiac pacemaker: Secondary | ICD-10-CM

## 2020-12-18 DIAGNOSIS — D509 Iron deficiency anemia, unspecified: Secondary | ICD-10-CM | POA: Diagnosis present

## 2020-12-18 DIAGNOSIS — J449 Chronic obstructive pulmonary disease, unspecified: Secondary | ICD-10-CM | POA: Diagnosis not present

## 2020-12-18 DIAGNOSIS — I2510 Atherosclerotic heart disease of native coronary artery without angina pectoris: Secondary | ICD-10-CM | POA: Diagnosis not present

## 2020-12-18 DIAGNOSIS — I350 Nonrheumatic aortic (valve) stenosis: Secondary | ICD-10-CM | POA: Diagnosis present

## 2020-12-18 DIAGNOSIS — Z4682 Encounter for fitting and adjustment of non-vascular catheter: Secondary | ICD-10-CM | POA: Diagnosis not present

## 2020-12-18 DIAGNOSIS — Z20822 Contact with and (suspected) exposure to covid-19: Secondary | ICD-10-CM | POA: Diagnosis not present

## 2020-12-18 DIAGNOSIS — K920 Hematemesis: Secondary | ICD-10-CM

## 2020-12-18 DIAGNOSIS — Z4659 Encounter for fitting and adjustment of other gastrointestinal appliance and device: Secondary | ICD-10-CM

## 2020-12-18 DIAGNOSIS — E11311 Type 2 diabetes mellitus with unspecified diabetic retinopathy with macular edema: Secondary | ICD-10-CM | POA: Diagnosis present

## 2020-12-18 DIAGNOSIS — Z66 Do not resuscitate: Secondary | ICD-10-CM | POA: Diagnosis not present

## 2020-12-18 DIAGNOSIS — D62 Acute posthemorrhagic anemia: Secondary | ICD-10-CM | POA: Diagnosis not present

## 2020-12-18 DIAGNOSIS — R0902 Hypoxemia: Secondary | ICD-10-CM | POA: Diagnosis not present

## 2020-12-18 DIAGNOSIS — J9601 Acute respiratory failure with hypoxia: Secondary | ICD-10-CM | POA: Diagnosis not present

## 2020-12-18 DIAGNOSIS — Z6827 Body mass index (BMI) 27.0-27.9, adult: Secondary | ICD-10-CM

## 2020-12-18 DIAGNOSIS — I1310 Hypertensive heart and chronic kidney disease without heart failure, with stage 1 through stage 4 chronic kidney disease, or unspecified chronic kidney disease: Secondary | ICD-10-CM | POA: Diagnosis not present

## 2020-12-18 DIAGNOSIS — Z7189 Other specified counseling: Secondary | ICD-10-CM

## 2020-12-18 DIAGNOSIS — J9811 Atelectasis: Secondary | ICD-10-CM | POA: Diagnosis not present

## 2020-12-18 DIAGNOSIS — R578 Other shock: Secondary | ICD-10-CM

## 2020-12-18 DIAGNOSIS — K250 Acute gastric ulcer with hemorrhage: Secondary | ICD-10-CM

## 2020-12-18 DIAGNOSIS — K219 Gastro-esophageal reflux disease without esophagitis: Secondary | ICD-10-CM | POA: Diagnosis present

## 2020-12-18 DIAGNOSIS — R739 Hyperglycemia, unspecified: Secondary | ICD-10-CM | POA: Diagnosis not present

## 2020-12-18 DIAGNOSIS — N179 Acute kidney failure, unspecified: Secondary | ICD-10-CM | POA: Diagnosis not present

## 2020-12-18 DIAGNOSIS — E669 Obesity, unspecified: Secondary | ICD-10-CM | POA: Diagnosis present

## 2020-12-18 DIAGNOSIS — Z800 Family history of malignant neoplasm of digestive organs: Secondary | ICD-10-CM

## 2020-12-18 DIAGNOSIS — K3182 Dieulafoy lesion (hemorrhagic) of stomach and duodenum: Principal | ICD-10-CM | POA: Diagnosis present

## 2020-12-18 DIAGNOSIS — I959 Hypotension, unspecified: Secondary | ICD-10-CM | POA: Diagnosis not present

## 2020-12-18 DIAGNOSIS — Z8052 Family history of malignant neoplasm of bladder: Secondary | ICD-10-CM

## 2020-12-18 DIAGNOSIS — Z8249 Family history of ischemic heart disease and other diseases of the circulatory system: Secondary | ICD-10-CM

## 2020-12-18 DIAGNOSIS — E875 Hyperkalemia: Secondary | ICD-10-CM | POA: Diagnosis present

## 2020-12-18 DIAGNOSIS — Z794 Long term (current) use of insulin: Secondary | ICD-10-CM

## 2020-12-18 DIAGNOSIS — Z825 Family history of asthma and other chronic lower respiratory diseases: Secondary | ICD-10-CM

## 2020-12-18 DIAGNOSIS — Z801 Family history of malignant neoplasm of trachea, bronchus and lung: Secondary | ICD-10-CM

## 2020-12-18 HISTORY — PX: ESOPHAGOGASTRODUODENOSCOPY: SHX5428

## 2020-12-18 HISTORY — PX: VISCERAL ANGIOGRAPHY: CATH118276

## 2020-12-18 LAB — PROTIME-INR
INR: 1.3 — ABNORMAL HIGH (ref 0.8–1.2)
INR: 1.4 — ABNORMAL HIGH (ref 0.8–1.2)
Prothrombin Time: 16.6 seconds — ABNORMAL HIGH (ref 11.4–15.2)
Prothrombin Time: 16.7 seconds — ABNORMAL HIGH (ref 11.4–15.2)

## 2020-12-18 LAB — BASIC METABOLIC PANEL
Anion gap: 6 (ref 5–15)
BUN: 32 mg/dL — ABNORMAL HIGH (ref 8–23)
CO2: 23 mmol/L (ref 22–32)
Calcium: 7.7 mg/dL — ABNORMAL LOW (ref 8.9–10.3)
Chloride: 106 mmol/L (ref 98–111)
Creatinine, Ser: 1.78 mg/dL — ABNORMAL HIGH (ref 0.61–1.24)
GFR, Estimated: 40 mL/min — ABNORMAL LOW (ref >60)
Glucose, Bld: 384 mg/dL — ABNORMAL HIGH (ref 70–99)
Potassium: 5.7 mmol/L — ABNORMAL HIGH (ref 3.5–5.1)
Sodium: 135 mmol/L (ref 135–145)

## 2020-12-18 LAB — COMPREHENSIVE METABOLIC PANEL
ALT: 31 U/L (ref 0–44)
AST: 32 U/L (ref 15–41)
Albumin: 3 g/dL — ABNORMAL LOW (ref 3.5–5.0)
Alkaline Phosphatase: 100 U/L (ref 38–126)
Anion gap: 8 (ref 5–15)
BUN: 26 mg/dL — ABNORMAL HIGH (ref 8–23)
CO2: 21 mmol/L — ABNORMAL LOW (ref 22–32)
Calcium: 8 mg/dL — ABNORMAL LOW (ref 8.9–10.3)
Chloride: 107 mmol/L (ref 98–111)
Creatinine, Ser: 1.89 mg/dL — ABNORMAL HIGH (ref 0.61–1.24)
GFR, Estimated: 37 mL/min — ABNORMAL LOW (ref >60)
Glucose, Bld: 283 mg/dL — ABNORMAL HIGH (ref 70–99)
Potassium: 3.8 mmol/L (ref 3.5–5.1)
Sodium: 136 mmol/L (ref 135–145)
Total Bilirubin: 0.6 mg/dL (ref 0.3–1.2)
Total Protein: 5.3 g/dL — ABNORMAL LOW (ref 6.5–8.1)

## 2020-12-18 LAB — CBC WITH DIFFERENTIAL/PLATELET
Abs Immature Granulocytes: 0.01 10*3/uL (ref 0.00–0.07)
Abs Immature Granulocytes: 0.06 10*3/uL (ref 0.00–0.07)
Basophils Absolute: 0 10*3/uL (ref 0.0–0.1)
Basophils Absolute: 0.1 10*3/uL (ref 0.0–0.1)
Basophils Relative: 0 %
Basophils Relative: 1 %
Eosinophils Absolute: 0 10*3/uL (ref 0.0–0.5)
Eosinophils Absolute: 0.1 10*3/uL (ref 0.0–0.5)
Eosinophils Relative: 0 %
Eosinophils Relative: 2 %
HCT: 20 % — ABNORMAL LOW (ref 39.0–52.0)
HCT: 28.3 % — ABNORMAL LOW (ref 39.0–52.0)
Hemoglobin: 6.9 g/dL — ABNORMAL LOW (ref 13.0–17.0)
Hemoglobin: 9.3 g/dL — ABNORMAL LOW (ref 13.0–17.0)
Immature Granulocytes: 0 %
Immature Granulocytes: 1 %
Lymphocytes Relative: 26 %
Lymphocytes Relative: 8 %
Lymphs Abs: 1 10*3/uL (ref 0.7–4.0)
Lymphs Abs: 1.2 10*3/uL (ref 0.7–4.0)
MCH: 31.5 pg (ref 26.0–34.0)
MCH: 32.9 pg (ref 26.0–34.0)
MCHC: 32.9 g/dL (ref 30.0–36.0)
MCHC: 34.5 g/dL (ref 30.0–36.0)
MCV: 100 fL (ref 80.0–100.0)
MCV: 91.3 fL (ref 80.0–100.0)
Monocytes Absolute: 0.5 10*3/uL (ref 0.1–1.0)
Monocytes Absolute: 1.3 10*3/uL — ABNORMAL HIGH (ref 0.1–1.0)
Monocytes Relative: 10 %
Monocytes Relative: 11 %
Neutro Abs: 10.5 10*3/uL — ABNORMAL HIGH (ref 1.7–7.7)
Neutro Abs: 2.8 10*3/uL (ref 1.7–7.7)
Neutrophils Relative %: 60 %
Neutrophils Relative %: 81 %
Platelets: 127 10*3/uL — ABNORMAL LOW (ref 150–400)
Platelets: 185 10*3/uL (ref 150–400)
RBC: 2.19 MIL/uL — ABNORMAL LOW (ref 4.22–5.81)
RBC: 2.83 MIL/uL — ABNORMAL LOW (ref 4.22–5.81)
RDW: 13.1 % (ref 11.5–15.5)
RDW: 17.2 % — ABNORMAL HIGH (ref 11.5–15.5)
Smear Review: NORMAL
WBC: 12.9 10*3/uL — ABNORMAL HIGH (ref 4.0–10.5)
WBC: 4.7 10*3/uL (ref 4.0–10.5)
nRBC: 0 % (ref 0.0–0.2)
nRBC: 0 % (ref 0.0–0.2)

## 2020-12-18 LAB — PROCALCITONIN: Procalcitonin: <0.1 ng/mL

## 2020-12-18 LAB — FIBRINOGEN
Fibrinogen: 159 mg/dL — ABNORMAL LOW (ref 210–475)
Fibrinogen: 293 mg/dL (ref 210–475)

## 2020-12-18 LAB — GLUCOSE, CAPILLARY
Glucose-Capillary: 341 mg/dL — ABNORMAL HIGH (ref 70–99)
Glucose-Capillary: 380 mg/dL — ABNORMAL HIGH (ref 70–99)
Glucose-Capillary: 373 mg/dL — ABNORMAL HIGH (ref 70–99)
Glucose-Capillary: 372 mg/dL — ABNORMAL HIGH (ref 70–99)
Glucose-Capillary: 329 mg/dL — ABNORMAL HIGH (ref 70–99)

## 2020-12-18 LAB — LACTIC ACID, PLASMA
Lactic Acid, Venous: 2.8 mmol/L (ref 0.5–1.9)
Lactic Acid, Venous: 4.6 mmol/L (ref 0.5–1.9)

## 2020-12-18 LAB — CBC
HCT: 36.6 % — ABNORMAL LOW (ref 39.0–52.0)
Hemoglobin: 12.3 g/dL — ABNORMAL LOW (ref 13.0–17.0)
MCH: 31.4 pg (ref 26.0–34.0)
MCHC: 33.6 g/dL (ref 30.0–36.0)
MCV: 93.4 fL (ref 80.0–100.0)
Platelets: 143 10*3/uL — ABNORMAL LOW (ref 150–400)
RBC: 3.92 MIL/uL — ABNORMAL LOW (ref 4.22–5.81)
RDW: 16.1 % — ABNORMAL HIGH (ref 11.5–15.5)
WBC: 8.8 10*3/uL (ref 4.0–10.5)
nRBC: 0 % (ref 0.0–0.2)

## 2020-12-18 LAB — PREPARE RBC (CROSSMATCH)

## 2020-12-18 LAB — RESP PANEL BY RT-PCR (FLU A&B, COVID) ARPGX2
Influenza A by PCR: NEGATIVE
Influenza B by PCR: NEGATIVE
SARS Coronavirus 2 by RT PCR: NEGATIVE

## 2020-12-18 LAB — POTASSIUM
Potassium: 5.5 mmol/L — ABNORMAL HIGH (ref 3.5–5.1)
Potassium: 5.1 mmol/L (ref 3.5–5.1)

## 2020-12-18 LAB — MAGNESIUM
Magnesium: 2 mg/dL (ref 1.7–2.4)
Magnesium: 2.1 mg/dL (ref 1.7–2.4)

## 2020-12-18 LAB — HEMOGLOBIN AND HEMATOCRIT, BLOOD
HCT: 30.2 % — ABNORMAL LOW (ref 39.0–52.0)
Hemoglobin: 10.4 g/dL — ABNORMAL LOW (ref 13.0–17.0)

## 2020-12-18 LAB — PHOSPHORUS: Phosphorus: 6.3 mg/dL — ABNORMAL HIGH (ref 2.5–4.6)

## 2020-12-18 LAB — PLATELET COUNT: Platelets: 167 10*3/uL (ref 150–400)

## 2020-12-18 SURGERY — LAPAROTOMY, EXPLORATORY
Anesthesia: Choice

## 2020-12-18 SURGERY — VISCERAL ANGIOGRAPHY
Anesthesia: Moderate Sedation

## 2020-12-18 SURGERY — EGD (ESOPHAGOGASTRODUODENOSCOPY)
Anesthesia: General

## 2020-12-18 MED ORDER — SODIUM CHLORIDE 0.9% IV SOLUTION: Freq: Once | INTRAVENOUS | Status: AC

## 2020-12-18 MED ORDER — DOCUSATE SODIUM 50 MG/5ML PO LIQD
100.0000 mg | Freq: Two times a day (BID) | ORAL | Status: DC
  Filled 2020-12-18 (×2): qty 10

## 2020-12-18 MED ORDER — POLYVINYL ALCOHOL 1.4 % OP SOLN
1.0000 [drp] | Freq: Four times a day (QID) | OPHTHALMIC | Status: DC | PRN
  Filled 2020-12-18: qty 15

## 2020-12-18 MED ORDER — TRANEXAMIC ACID-NACL 1000-0.7 MG/100ML-% IV SOLN
1000.0000 mg | Freq: Once | INTRAVENOUS | Status: AC
  Administered 2020-12-18: 1000 mg via INTRAVENOUS
  Filled 2020-12-18: qty 100

## 2020-12-18 MED ORDER — PANTOPRAZOLE SODIUM 40 MG IV SOLR: 40.0000 mg | Freq: Two times a day (BID) | INTRAVENOUS | Status: DC

## 2020-12-18 MED ORDER — CHLORHEXIDINE GLUCONATE 0.12% ORAL RINSE (MEDLINE KIT)
15.0000 mL | Freq: Two times a day (BID) | OROMUCOSAL | Status: DC
  Administered 2020-12-18: 15 mL via OROMUCOSAL

## 2020-12-18 MED ORDER — ACETAMINOPHEN 650 MG RE SUPP: 650.0000 mg | Freq: Four times a day (QID) | RECTAL | Status: DC | PRN

## 2020-12-18 MED ORDER — STERILE WATER FOR INJECTION IV SOLN
INTRAVENOUS | Status: DC
  Filled 2020-12-18: qty 150
  Filled 2020-12-18 (×2): qty 1000

## 2020-12-18 MED ORDER — INSULIN ASPART 100 UNIT/ML IJ SOLN
0.0000 [IU] | INTRAMUSCULAR | Status: DC
  Administered 2020-12-18: 8 [IU] via SUBCUTANEOUS
  Administered 2020-12-18: 15 [IU] via SUBCUTANEOUS
  Administered 2020-12-18: 11 [IU] via SUBCUTANEOUS
  Administered 2020-12-18: 15 [IU] via SUBCUTANEOUS
  Filled 2020-12-18 (×4): qty 1

## 2020-12-18 MED ORDER — FENTANYL 2500MCG IN NS 250ML (10MCG/ML) PREMIX INFUSION
25.0000 ug/h | INTRAVENOUS | Status: DC
  Administered 2020-12-18: 25 ug/h via INTRAVENOUS
  Administered 2020-12-18: 100 ug/h via INTRAVENOUS
  Filled 2020-12-18: qty 250

## 2020-12-18 MED ORDER — CHLORHEXIDINE GLUCONATE CLOTH 2 % EX PADS
6.0000 | MEDICATED_PAD | Freq: Every day | CUTANEOUS | Status: DC
  Administered 2020-12-18: 6 via TOPICAL

## 2020-12-18 MED ORDER — EPHEDRINE 5 MG/ML INJ
INTRAVENOUS | Status: AC
  Filled 2020-12-18: qty 10

## 2020-12-18 MED ORDER — ORAL CARE MOUTH RINSE
15.0000 mL | OROMUCOSAL | Status: DC
  Administered 2020-12-18 (×4): 15 mL via OROMUCOSAL

## 2020-12-18 MED ORDER — SODIUM CHLORIDE 0.9 % IV SOLN
20.0000 ug | Freq: Once | INTRAVENOUS | Status: AC
  Administered 2020-12-18: 20 ug via INTRAVENOUS
  Filled 2020-12-18: qty 5

## 2020-12-18 MED ORDER — VITAMIN K1 10 MG/ML IJ SOLN
10.0000 mg | Freq: Once | INTRAVENOUS | Status: AC
  Administered 2020-12-18: 10 mg via INTRAVENOUS
  Filled 2020-12-18: qty 1

## 2020-12-18 MED ORDER — LORAZEPAM 2 MG/ML PO CONC
1.0000 mg | ORAL | Status: DC | PRN
  Filled 2020-12-18: qty 0.5

## 2020-12-18 MED ORDER — GLYCOPYRROLATE 0.2 MG/ML IJ SOLN
0.2000 mg | INTRAMUSCULAR | Status: DC | PRN
  Administered 2020-12-18: 0.2 mg via INTRAVENOUS
  Filled 2020-12-18: qty 1

## 2020-12-18 MED ORDER — SODIUM CHLORIDE 0.9 % IV SOLN: INTRAVENOUS | Status: DC

## 2020-12-18 MED ORDER — MORPHINE BOLUS VIA INFUSION
2.0000 mg | INTRAVENOUS | Status: DC | PRN
Start: 2020-12-18 — End: 2020-12-19
  Filled 2020-12-18: qty 2

## 2020-12-18 MED ORDER — SODIUM CHLORIDE 0.9 % IV SOLN
10.0000 mL/h | Freq: Once | INTRAVENOUS | Status: AC
  Administered 2020-12-18: 10 mL/h via INTRAVENOUS

## 2020-12-18 MED ORDER — POLYETHYLENE GLYCOL 3350 17 G PO PACK: 17.0000 g | PACK | Freq: Every day | ORAL | Status: DC | PRN

## 2020-12-18 MED ORDER — SODIUM BICARBONATE 8.4 % IV SOLN
50.0000 meq | Freq: Once | INTRAVENOUS | Status: AC
  Administered 2020-12-18: 50 meq via INTRAVENOUS

## 2020-12-18 MED ORDER — ONDANSETRON HCL 4 MG/2ML IJ SOLN
4.0000 mg | Freq: Four times a day (QID) | INTRAMUSCULAR | Status: DC | PRN
  Administered 2020-12-18: 4 mg via INTRAVENOUS
  Filled 2020-12-18: qty 2

## 2020-12-18 MED ORDER — PHENYLEPHRINE HCL (PRESSORS) 10 MG/ML IV SOLN
INTRAVENOUS | Status: AC
  Filled 2020-12-18: qty 1

## 2020-12-18 MED ORDER — NOREPINEPHRINE 4 MG/250ML-% IV SOLN
0.0000 ug/min | INTRAVENOUS | Status: DC
  Administered 2020-12-18: 2 ug/min via INTRAVENOUS
  Filled 2020-12-18: qty 250

## 2020-12-18 MED ORDER — GLYCOPYRROLATE 1 MG PO TABS
1.0000 mg | ORAL_TABLET | ORAL | Status: DC | PRN
  Filled 2020-12-18: qty 1

## 2020-12-18 MED ORDER — SODIUM CHLORIDE 0.9% IV SOLUTION: Freq: Once | INTRAVENOUS | Status: DC

## 2020-12-18 MED ORDER — DOCUSATE SODIUM 100 MG PO CAPS: 100.0000 mg | ORAL_CAPSULE | Freq: Two times a day (BID) | ORAL | Status: DC | PRN

## 2020-12-18 MED ORDER — POLYETHYLENE GLYCOL 3350 17 G PO PACK: 17.0000 g | PACK | Freq: Every day | ORAL | Status: DC

## 2020-12-18 MED ORDER — SODIUM CHLORIDE 0.9 % IV SOLN
50.0000 ug/h | INTRAVENOUS | Status: DC
  Administered 2020-12-18 (×2): 50 ug/h via INTRAVENOUS
  Filled 2020-12-18 (×5): qty 1

## 2020-12-18 MED ORDER — LORAZEPAM 2 MG/ML IJ SOLN
1.0000 mg | INTRAMUSCULAR | Status: DC | PRN
  Administered 2020-12-18: 1 mg via INTRAVENOUS
  Filled 2020-12-18: qty 1

## 2020-12-18 MED ORDER — INSULIN DETEMIR 100 UNIT/ML ~~LOC~~ SOLN
10.0000 [IU] | Freq: Two times a day (BID) | SUBCUTANEOUS | Status: DC
  Administered 2020-12-18: 10 [IU] via SUBCUTANEOUS
  Filled 2020-12-18 (×3): qty 0.1

## 2020-12-18 MED ORDER — PROPOFOL 10 MG/ML IV BOLUS
INTRAVENOUS | Status: AC
  Filled 2020-12-18: qty 20

## 2020-12-18 MED ORDER — LORAZEPAM 1 MG PO TABS: 1.0000 mg | ORAL_TABLET | ORAL | Status: DC | PRN

## 2020-12-18 MED ORDER — PROTHROMBIN COMPLEX CONC HUMAN 500 UNITS IV KIT
4420.0000 [IU] | PACK | Status: DC
  Filled 2020-12-18: qty 4420

## 2020-12-18 MED ORDER — IOHEXOL 300 MG/ML  SOLN
75.0000 mL | Freq: Once | INTRAMUSCULAR | Status: AC | PRN
  Administered 2020-12-18: 75 mL via INTRAVENOUS

## 2020-12-18 MED ORDER — INSULIN DETEMIR 100 UNIT/ML ~~LOC~~ SOLN
15.0000 [IU] | Freq: Two times a day (BID) | SUBCUTANEOUS | Status: DC
  Filled 2020-12-18 (×2): qty 0.15

## 2020-12-18 MED ORDER — HALOPERIDOL LACTATE 2 MG/ML PO CONC
0.5000 mg | ORAL | Status: DC | PRN
  Filled 2020-12-18: qty 0.3

## 2020-12-18 MED ORDER — VASOPRESSIN 20 UNIT/ML IV SOLN
0.0000 [IU]/min | INTRAVENOUS | Status: DC
  Administered 2020-12-18: 0.2 [IU]/min via INTRAVENOUS
  Administered 2020-12-18 (×2): 0.4 [IU]/min via INTRAVENOUS
  Administered 2020-12-18: 0.2 [IU]/min via INTRAVENOUS
  Administered 2020-12-18: 0.4 [IU]/min via INTRAVENOUS
  Filled 2020-12-18 (×5): qty 2.5

## 2020-12-18 MED ORDER — BIOTENE DRY MOUTH MT LIQD: 15.0000 mL | OROMUCOSAL | Status: DC | PRN

## 2020-12-18 MED ORDER — MORPHINE 100MG IN NS 100ML (1MG/ML) PREMIX INFUSION
2.0000 mg/h | INTRAVENOUS | Status: DC
  Administered 2020-12-18: 2 mg/h via INTRAVENOUS
  Filled 2020-12-18: qty 100

## 2020-12-18 MED ORDER — ACETAMINOPHEN 325 MG PO TABS: 650.0000 mg | ORAL_TABLET | Freq: Four times a day (QID) | ORAL | Status: DC | PRN

## 2020-12-18 MED ORDER — FENTANYL CITRATE (PF) 100 MCG/2ML IJ SOLN
25.0000 ug | Freq: Once | INTRAMUSCULAR | Status: AC
  Administered 2020-12-18: 25 ug via INTRAVENOUS
  Filled 2020-12-18: qty 2

## 2020-12-18 MED ORDER — HALOPERIDOL 0.5 MG PO TABS
0.5000 mg | ORAL_TABLET | ORAL | Status: DC | PRN
  Filled 2020-12-18: qty 1

## 2020-12-18 MED ORDER — PHENYLEPHRINE CONCENTRATED 100MG/250ML (0.4 MG/ML) INFUSION SIMPLE
0.0000 ug/min | INTRAVENOUS | Status: DC
  Filled 2020-12-18 (×2): qty 250

## 2020-12-18 MED ORDER — PROTHROMBIN COMPLEX CONC HUMAN 500 UNITS IV KIT
4420.0000 [IU] | PACK | Status: AC
  Administered 2020-12-18: 4420 [IU] via INTRAVENOUS
  Filled 2020-12-18: qty 4420

## 2020-12-18 MED ORDER — CALCIUM GLUCONATE-NACL 1-0.675 GM/50ML-% IV SOLN
1.0000 g | Freq: Once | INTRAVENOUS | Status: AC
  Administered 2020-12-18: 1000 mg via INTRAVENOUS
  Filled 2020-12-18: qty 50

## 2020-12-18 MED ORDER — NOREPINEPHRINE 16 MG/250ML-% IV SOLN
0.0000 ug/min | INTRAVENOUS | Status: DC
  Administered 2020-12-18: 15 ug/min via INTRAVENOUS
  Filled 2020-12-18: qty 250

## 2020-12-18 MED ORDER — PROPOFOL 1000 MG/100ML IV EMUL
0.0000 ug/kg/min | INTRAVENOUS | Status: DC
  Administered 2020-12-18: 40 ug/kg/min via INTRAVENOUS
  Administered 2020-12-18: 30 ug/kg/min via INTRAVENOUS
  Administered 2020-12-18: 5 ug/kg/min via INTRAVENOUS
  Filled 2020-12-18 (×3): qty 100

## 2020-12-18 MED ORDER — HALOPERIDOL LACTATE 5 MG/ML IJ SOLN: 0.5000 mg | INTRAMUSCULAR | Status: DC | PRN

## 2020-12-18 MED ORDER — IODIXANOL 320 MG/ML IV SOLN
INTRAVENOUS | Status: DC | PRN
  Administered 2020-12-18: 75 mL

## 2020-12-18 MED ORDER — LORAZEPAM 2 MG/ML IJ SOLN
0.5000 mg | Freq: Once | INTRAMUSCULAR | Status: AC
  Administered 2020-12-18: 0.5 mg via INTRAVENOUS
  Filled 2020-12-18: qty 1

## 2020-12-18 MED ORDER — VASOPRESSIN 20 UNIT/ML IV SOLN
INTRAVENOUS | Status: AC
  Filled 2020-12-18: qty 1

## 2020-12-18 MED ORDER — SODIUM BICARBONATE 8.4 % IV SOLN: 100.0000 meq | Freq: Once | INTRAVENOUS | Status: DC

## 2020-12-18 MED ORDER — GLYCOPYRROLATE 0.2 MG/ML IJ SOLN: 0.2000 mg | INTRAMUSCULAR | Status: DC | PRN

## 2020-12-18 MED ORDER — PANTOPRAZOLE 80MG IVPB - SIMPLE MED
80.0000 mg | Freq: Once | INTRAVENOUS | Status: AC
  Administered 2020-12-18: 80 mg via INTRAVENOUS
  Filled 2020-12-18: qty 80

## 2020-12-18 MED ORDER — PANTOPRAZOLE INFUSION (NEW) - SIMPLE MED
8.0000 mg/h | INTRAVENOUS | Status: DC
  Administered 2020-12-18 (×3): 8 mg/h via INTRAVENOUS
  Filled 2020-12-18 (×3): qty 80

## 2020-12-18 SURGICAL SUPPLY — 61 items
APL PRP STRL LF DISP 70% ISPRP (MISCELLANEOUS) ×1
CANISTER SUCT 1200ML W/VALVE (MISCELLANEOUS) ×3 IMPLANT
CELL SAVER LIPIGURD (MISCELLANEOUS) IMPLANT
CHLORAPREP W/TINT 26 (MISCELLANEOUS) ×3 IMPLANT
DEVICE RETRIEVAL ALEXIS 14 (MISCELLANEOUS) IMPLANT
DRAIN CHANNEL JP 15F RND 16 (MISCELLANEOUS) IMPLANT
DRAPE LAPAROTOMY 100X77 ABD (DRAPES) ×3 IMPLANT
DRSG OPSITE POSTOP 4X10 (GAUZE/BANDAGES/DRESSINGS) ×3 IMPLANT
DRSG OPSITE POSTOP 4X8 (GAUZE/BANDAGES/DRESSINGS) ×3 IMPLANT
DRSG TEGADERM 4X10 (GAUZE/BANDAGES/DRESSINGS) IMPLANT
DRSG TELFA 3X8 NADH (GAUZE/BANDAGES/DRESSINGS) IMPLANT
ELECT BLADE 6.5 EXT (BLADE) ×3 IMPLANT
ELECT CAUTERY BLADE 6.4 (BLADE) ×2 IMPLANT
ELECT REM PT RETURN 9FT ADLT (ELECTROSURGICAL) ×2
ELECTRODE REM PT RTRN 9FT ADLT (ELECTROSURGICAL) ×2 IMPLANT
EXTRT SYSTEM ALEXIS 14CM (MISCELLANEOUS)
EXTRT SYSTEM ALEXIS 17CM (MISCELLANEOUS)
GAUZE 4X4 16PLY ~~LOC~~+RFID DBL (SPONGE) ×2 IMPLANT
GLOVE SURG ENC MOIS LTX SZ6.5 (GLOVE) ×3 IMPLANT
GLOVE SURG SYN 6.5 ES PF (GLOVE) ×6 IMPLANT
GLOVE SURG SYN 6.5 PF PI (GLOVE) ×3 IMPLANT
GLOVE SURG UNDER POLY LF SZ6.5 (GLOVE) ×3 IMPLANT
GLOVE SURG UNDER POLY LF SZ7 (GLOVE) ×9 IMPLANT
GOWN STRL REUS W/ TWL LRG LVL3 (GOWN DISPOSABLE) ×8 IMPLANT
GOWN STRL REUS W/TWL LRG LVL3 (GOWN DISPOSABLE) ×8
HANDLE SUCTION POOLE (INSTRUMENTS) ×2 IMPLANT
HOLDER FOLEY CATH W/STRAP (MISCELLANEOUS) ×3 IMPLANT
KIT TURNOVER KIT A (KITS) ×3 IMPLANT
LABEL OR SOLS (LABEL) ×3 IMPLANT
LIGASURE IMPACT 36 18CM CVD LR (INSTRUMENTS) ×3 IMPLANT
LIGHT WAVEGUIDE WIDE FLAT (MISCELLANEOUS) ×3 IMPLANT
MANIFOLD NEPTUNE II (INSTRUMENTS) ×3 IMPLANT
NEEDLE HYPO 22GX1.5 SAFETY (NEEDLE) ×3 IMPLANT
NS IRRIG 1000ML POUR BTL (IV SOLUTION) ×3 IMPLANT
PACK BASIN MAJOR ARMC (MISCELLANEOUS) ×3 IMPLANT
PACK COLON CLEAN CLOSURE (MISCELLANEOUS) ×3 IMPLANT
PAD DRESSING TELFA 3X8 NADH (GAUZE/BANDAGES/DRESSINGS) IMPLANT
RELOAD PROXIMATE 75MM BLUE (ENDOMECHANICALS) IMPLANT
RELOAD STAPLE 75 3.8 BLU REG (ENDOMECHANICALS) IMPLANT
RETRACTOR WND ALEXIS-O 25 LRG (MISCELLANEOUS) IMPLANT
RTRCTR WOUND ALEXIS O 25CM LRG (MISCELLANEOUS)
SOL PREP PVP 2OZ (MISCELLANEOUS) ×2
SOLUTION PREP PVP 2OZ (MISCELLANEOUS) ×2 IMPLANT
SPONGE T-LAP 18X18 ~~LOC~~+RFID (SPONGE) ×12 IMPLANT
STAPLER PROXIMATE 75MM BLUE (STAPLE) IMPLANT
STAPLER SKIN PROX 35W (STAPLE) ×3 IMPLANT
SUCTION POOLE HANDLE (INSTRUMENTS) ×2
SURGILUBE 2OZ TUBE FLIPTOP (MISCELLANEOUS) IMPLANT
SUT PDS AB 1 TP1 54 (SUTURE) ×6 IMPLANT
SUT SILK 2 0 (SUTURE) ×2
SUT SILK 2-0 18XBRD TIE 12 (SUTURE) ×2 IMPLANT
SUT SILK 3 0 (SUTURE) ×2
SUT SILK 3-0 (SUTURE) ×2 IMPLANT
SUT SILK 3-0 18XBRD TIE 12 (SUTURE) ×2 IMPLANT
SUT VIC AB 3-0 SH 27 (SUTURE) ×4
SUT VIC AB 3-0 SH 27X BRD (SUTURE) ×2 IMPLANT
SYR 20ML LL LF (SYRINGE) ×3 IMPLANT
SYSTEM CONTND EXTRCTN KII BLLN (MISCELLANEOUS) IMPLANT
TOWEL OR 17X26 4PK STRL BLUE (TOWEL DISPOSABLE) ×2 IMPLANT
TRAY FOLEY MTR SLVR 16FR STAT (SET/KITS/TRAYS/PACK) ×2 IMPLANT
TRAY FOLEY SLVR 16FR LF STAT (SET/KITS/TRAYS/PACK) ×3 IMPLANT

## 2020-12-18 SURGICAL SUPPLY — 17 items
BLOCK BEAD 700-900 (Vascular Products) ×1 IMPLANT
CATH ANGIO 5F PIGTAIL 65CM (CATHETERS) ×2 IMPLANT
CATH MICROCATH PRGRT 2.8F 110 (CATHETERS) IMPLANT
CATH VS15FR (CATHETERS) ×1 IMPLANT
COIL 400 COMPLEX SOFT 2X4CM (Vascular Products) ×1 IMPLANT
DEVICE OCCLUSION PODJ15 (Vascular Products) IMPLANT
DEVICE STARCLOSE SE CLOSURE (Vascular Products) ×1 IMPLANT
DEVICE TORQUE (MISCELLANEOUS) ×1 IMPLANT
GLIDEWIRE STIFF .35X180X3 HYDR (WIRE) ×1 IMPLANT
HANDLE DETACHMENT COIL (MISCELLANEOUS) ×1 IMPLANT
MICROCATH PROGREAT 2.8F 110 CM (CATHETERS) ×2
OCCLUSION DEVICE PODJ15 (Vascular Products) ×2 IMPLANT
PACK ANGIOGRAPHY (CUSTOM PROCEDURE TRAY) ×2 IMPLANT
SHEATH BRITE TIP 5FRX11 (SHEATH) ×1 IMPLANT
SYR MEDRAD MARK 7 150ML (SYRINGE) ×1 IMPLANT
TUBING CONTRAST HIGH PRESS 72 (TUBING) ×1 IMPLANT
WIRE GUIDERIGHT .035X150 (WIRE) ×1 IMPLANT

## 2020-12-19 ENCOUNTER — Encounter: Payer: Self-pay | Admitting: Gastroenterology

## 2020-12-19 LAB — TYPE AND SCREEN
ABO/RH(D): O POS
Antibody Screen: NEGATIVE
Unit division: 0
Unit division: 0
Unit division: 0
Unit division: 0
Unit division: 0
Unit division: 0
Unit division: 0
Unit division: 0

## 2020-12-19 LAB — BPAM FFP
Blood Product Expiration Date: 202207302359
Blood Product Expiration Date: 202208012359
Blood Product Expiration Date: 202208012359
Blood Product Expiration Date: 202208012359
ISSUE DATE / TIME: 202207270809
ISSUE DATE / TIME: 202207270931
ISSUE DATE / TIME: 202207271527
ISSUE DATE / TIME: 202207271527
Unit Type and Rh: 5100
Unit Type and Rh: 6200
Unit Type and Rh: 5100
Unit Type and Rh: 5100

## 2020-12-19 LAB — PREPARE FRESH FROZEN PLASMA
Unit division: 0
Unit division: 0
Unit division: 0

## 2020-12-19 LAB — PREPARE CRYOPRECIPITATE
Unit division: 0
Unit division: 0
Unit division: 0
Unit division: 0

## 2020-12-19 LAB — BPAM RBC
Blood Product Expiration Date: 202208052359
Blood Product Expiration Date: 202208052359
Blood Product Expiration Date: 202208222359
Blood Product Expiration Date: 202208222359
Blood Product Expiration Date: 202208262359
Blood Product Expiration Date: 202208262359
Blood Product Expiration Date: 202208262359
Blood Product Expiration Date: 202208262359
ISSUE DATE / TIME: 202207270323
ISSUE DATE / TIME: 202207270323
ISSUE DATE / TIME: 202207270347
ISSUE DATE / TIME: 202207270347
ISSUE DATE / TIME: 202207271450
ISSUE DATE / TIME: 202207271718
Unit Type and Rh: 5100
Unit Type and Rh: 5100
Unit Type and Rh: 5100
Unit Type and Rh: 5100
Unit Type and Rh: 5100
Unit Type and Rh: 5100
Unit Type and Rh: 5100
Unit Type and Rh: 5100

## 2020-12-19 LAB — BPAM CRYOPRECIPITATE
Blood Product Expiration Date: 202207271708
Blood Product Expiration Date: 202207271708
Blood Product Expiration Date: 202207272058
Blood Product Expiration Date: 202207272213
ISSUE DATE / TIME: 202207271135
ISSUE DATE / TIME: 202207271135
ISSUE DATE / TIME: 202207271527
ISSUE DATE / TIME: 202207271637
Unit Type and Rh: 5100
Unit Type and Rh: 6200
Unit Type and Rh: 6200
Unit Type and Rh: 6200

## 2020-12-19 LAB — PREPARE RBC (CROSSMATCH)

## 2020-12-20 ENCOUNTER — Telehealth: Payer: Self-pay | Admitting: Cardiovascular Disease

## 2020-12-20 DIAGNOSIS — G4733 Obstructive sleep apnea (adult) (pediatric): Secondary | ICD-10-CM | POA: Diagnosis not present

## 2020-12-20 NOTE — Telephone Encounter (Signed)
Patient wife calling to talk with Dr. Fletcher Anon.  She wants to personally say thank you for care and to let him know about patients passing.

## 2020-12-23 LAB — CULTURE, BLOOD (ROUTINE X 2)
Culture: NO GROWTH
Culture: NO GROWTH
Special Requests: ADEQUATE
Special Requests: ADEQUATE

## 2020-12-23 NOTE — Progress Notes (Signed)
Patient vomiting bright red blood around ETT.  Orogastric tube to low continuous suction.  Dr. Patsey Berthold at bedside.

## 2020-12-23 NOTE — H&P (Addendum)
NAME:  Seth Hardy, MRN:  [id number], DOB:  1946/02/19, LOS: 0 ADMISSION DATE:  January 06, 2021, CONSULTATION DATE:  01-06-21 REFERRING MD:  Hulan Saas, MD  CHIEF COMPLAINT: Hemoptysis  HPI  75 y.o with significant PMH of/as below who presented to the ED with chief complaints of hemoptysis.  Patient's wife repor onset of symptoms at around 11.30 pm last night. No associated symptoms of abdominal pain, redness of breath, chest pain, or bloody stools.  Patient's wife describes projectile vomiting of copious bright red blood.  Patient is also on Eliquis for atrial fibrillation which with the last dose reported as taking that evening.  ED Course: On arrival to the ED, he was afebrile with blood pressure initially 130/53 mm Hg and pulse rate 60 beats/min. There were no focal neurological deficits; he was alert but pale appearing, lethargic and diaphoretic. Initial labs/Diagnostics WBC/Hgb/Hct/Plts:  4.7/9.3/28.3/185 (07/27 0027)  EKG: Sinus rhythm with ventricular to 60, normal axis, QTc interval of 525 without clear evidence of acute ischemia or significant underlying arrhythmia UA: Pending Significant drop in hemoglobin from 12.4-9.3 within 1 month, acute GI bleed was suspected.  She was started on IV Protonix and octreotide prophylactic.  GI was consulted by EDP for further recommendation and possible endoscopy.  PCCM was consulted for admission to the ICU for close monitoring.  While waiting in the ED patient continued to have bloody emesis and was also noted to be more lethargic, diaphoretic with agonal respiration.  Patient was emergently intubated for airway protection.  He received 2 units of emergency release PRBC as well as reversal of his Eliquis with Kcentra.  Past Medical History  Basal cell carcinoma of skin 2012 removed several spots from arms and back CAD (coronary artery disease) 01/2010 CABG x 4: LIMA->LAD, VG->D1, VG->OM1, VG->PDA. b. NSTEMI 02/2012 in setting of AF-RVR with cath  s/p BMS to RCA (plan for 1 month, possibly up to 3 months of Plavix) Carotid disease, bilateral (HCC)a. 03/2011 U/S: 40-59% bilat Carotid dzs;  b. 04/2015 Carotid U/S: 40-59% bilat ICA stenosis; c. 05/2015 CTA Neck: [id number] RICA, [id number] LICA, mod-marked R vertebral stenosis, mod L vertebral stenosis. Chronic kidney disease small obtusion per pcp. ultrasound done on 08/29/2015 Diabetes mellitus type II, controlled ([place])   GERD (gastroesophageal reflux disease)   Heart murmur   Hyperlipidemia   Hypertension   Hypertensive heart disease GAVE (gastric antral venous ectasia-watermelon stomach) which is associated with chronic GI blood loss and frequently iron deficiency anemia  Iron deficiency anemia   Gr 1 DD, mild AS, mildly dil LA. Multiple gastric ulcers 2006 Myocardial infarction [facility]) 02/2012 stents (x1) at time Myocardial infarction [facility])   2nd one was after 02/2012; don't know date" (04/27/2018) Obesity   On home oxygen therapy   OSA on CPAP   PAF (paroxysmal atrial fibrillation) ([place])    a. Newly dx 02/2012 & initiated on Coumadin (spont converted to NSR). Presence of permanent cardiac pacemaker            Significant Hospital Events   7/27: Admitted to the ICU with acute upper GI bleed  Consults:  GI  Procedures:  7/27: Intubation 7/27: Right IJ central line placement  Significant Diagnostic Tests:  7/26: Chest Xray>no focal consolidation, overt edema, pneumothorax or other clear acute intrathoracic process 7/27: CTA abdomen and pelvis 7/27: CTA Chest  Micro Data:  7/26: SARS-CoV-2 PCR> negative 7/26: Influenza PCR> negative  Antimicrobials:  7/27 ceftriaxone>  OBJECTIVE  Blood pressure (!) 205/85, pulse 78, temperature 97.6  F (36.4 C), temperature source Oral, resp. rate 18, height '5\' 8"'$  (1.727 m), weight 81.6 kg, SpO2 100 %.        Intake/Output Summary (Last 24 hours) at 12/20/2020 0405 Last data filed at Dec 20, 2020 0134 Gross per 24 hour  Intake 99.97 ml  Output  --  Net 99.97 ml   Filed Weights   12-20-20 0022  Weight: 81.6 kg   Physical Examination  GENERAL: 75 year-old critically ill patient lying in the bed intubated, mechanically ventilated and sedated EYES: Pupils equal, round, reactive to light and accommodation. No scleral icterus. Extraocular muscles intact.  HEENT: Head atraumatic, normocephalic. Oropharynx with dry blood and nasopharynx clear.  NECK:  Supple, no jugular venous distention. No thyroid enlargement, no tenderness.  LUNGS: Normal breath sounds bilaterally, no wheezing, rales,rhonchi or crepitation. No use of accessory muscles of respiration.  CARDIOVASCULAR: S1, S2 normal. No murmurs, rubs, or gallops.  ABDOMEN: Soft, nontender, mildly distended. Bowel sounds present. No organomegaly or mass.  EXTREMITIES: No pedal edema, cyanosis, or clubbing.  NEUROLOGIC: Cranial nerves II through XII are intact.  Muscle strength not assessed. Sensation intact. Gait not checked.  PSYCHIATRIC: Unable to assess SKIN: No obvious rash, lesion, or ulcer.   Labs/imaging that I havepersonally reviewed  (right click and "Reselect all SmartList Selections" daily)     Labs   CBC: Recent Labs  Lab 12/20/2020 0027  WBC 4.7  NEUTROABS 2.8  HGB 9.3*  HCT 28.3*  MCV 100.0  PLT 123XX123    Basic Metabolic Panel: Recent Labs  Lab 12/20/20 0027  NA 136  K 3.8  CL 107  CO2 21*  GLUCOSE 283*  BUN 26*  CREATININE 1.89*  CALCIUM 8.0*  MG 2.0   GFR: Estimated Creatinine Clearance: 33.2 mL/min (A) (by C-G formula based on SCr of 1.89 mg/dL (H)). Recent Labs  Lab 2020/12/20 0027  WBC 4.7    Liver Function Tests: Recent Labs  Lab 2020/12/20 0027  AST 32  ALT 31  ALKPHOS 100  BILITOT 0.6  PROT 5.3*  ALBUMIN 3.0*   No results for input(s): LIPASE, AMYLASE in the last 168 hours. No results for input(s): AMMONIA in the last 168 hours.  ABG    Component Value Date/Time   PHART 7.379 02/17/2010 2305   PCO2ART 36.7 02/17/2010 2305    PO2ART 83.0 02/17/2010 2305   HCO3 21.5 02/17/2010 2305   TCO2 27 03/08/2012 1059   ACIDBASEDEF 3.0 (H) 02/17/2010 2305   O2SAT 95.0 02/17/2010 2305     Coagulation Profile: Recent Labs  Lab 12/20/2020 0027  INR 1.3*    Cardiac Enzymes: No results for input(s): CKTOTAL, CKMB, CKMBINDEX, TROPONINI in the last 168 hours.  HbA1C: Hgb A1c MFr Bld  Date/Time Value Ref Range Status  11/01/2020 01:51 PM 7.5 (H) 4.8 - 5.6 % Final    Comment:    (NOTE)         Prediabetes: 5.7 - 6.4         Diabetes: >6.4         Glycemic control for adults with diabetes: <7.0   12/26/2019 08:13 AM 5.7 (H) 4.8 - 5.6 % Final    Comment:    (NOTE) Pre diabetes:          5.7%-6.4%  Diabetes:              >6.4%  Glycemic control for   <7.0% adults with diabetes     CBG: No results for input(s): GLUCAP in the  last 168 hours.  Review of Systems:   UNABLE TO OBTAIN DUE TO PATIENT CURRENTLY INTUBATED AND SEDATED  Past Medical History  He,  has a past medical history of Basal cell carcinoma of skin (2012), CAD (coronary artery disease), Carotid disease, bilateral ([place]), Chronic kidney disease, Diabetes mellitus type II, controlled ([place]), Dysrhythmia, GERD (gastroesophageal reflux disease), Heart murmur, Hyperlipidemia, Hypertension, Hypertensive heart disease, Iron deficiency anemia, Iron deficiency anemia (08/2015), Macular edema (bil), Mild aortic stenosis, Multiple gastric ulcers (2006), Myocardial infarction ([place]) (02/2012), Myocardial infarction ([place]), Obesity, On home oxygen therapy, OSA on CPAP, PAF (paroxysmal atrial fibrillation) ([place]), Presence of permanent cardiac pacemaker, Shortness of breath dyspnea, and Transfusion history.   Surgical History    Past Surgical History:  Procedure Laterality Date   ANGIOPLASTY     BASAL CELL CARCINOMA EXCISION     "shoulder, arm X 2"   BIOPSY  09/02/2020   Procedure: BIOPSY;  Surgeon: Gatha Mayer, MD;  Location: [facility] ENDOSCOPY;  Service: Endoscopy;;    CARDIAC CATHETERIZATION  2014   CARDIAC CATHETERIZATION  2021   [facility] x1 stent   CATARACT EXTRACTION W/ INTRAOCULAR LENS  IMPLANT, BILATERAL Bilateral 2016   COLONOSCOPY WITH PROPOFOL N/A 10/15/2015   Procedure: COLONOSCOPY WITH PROPOFOL;  Surgeon: Gatha Mayer, MD;  Location: [facility] ENDOSCOPY;  Service: Endoscopy;  Laterality: N/A;   CORONARY ANGIOPLASTY     Dr. Fletcher Anon   CORONARY ARTERY BYPASS GRAFT  2011   CABG X3-4; [facility] [place], [place],    CORONARY ATHERECTOMY  04/27/2018   CORONARY ATHERECTOMY N/A 04/27/2018   Procedure: CORONARY ATHERECTOMY;  Surgeon: Belva Crome, MD;  Location: [place] CV LAB;  Service: Cardiovascular;  Laterality: N/A;   cyst L kidney  [place] Left 08/30/2015   Procedure: ENDARTERECTOMY CAROTID;  Surgeon: Katha Cabal, MD;  Location: [facility] ORS;  Service: Vascular;  Laterality: Left;   ESOPHAGOGASTRODUODENOSCOPY (EGD) WITH PROPOFOL N/A 10/15/2015   Procedure: ESOPHAGOGASTRODUODENOSCOPY (EGD) WITH PROPOFOL;  Surgeon: Gatha Mayer, MD;  [facility]: [facility];  Service: Endoscopy;  Laterality: N/A;   ESOPHAGOGASTRODUODENOSCOPY (EGD) WITH PROPOFOL N/A 01/12/2020   Procedure: ESOPHAGOGASTRODUODENOSCOPY (EGD) WITH PROPOFOL;  Surgeon: Jerene Bears, MD;  [facility] ENDOSCOPY;  Service: Gastroenterology;  Laterality: N/A;   ESOPHAGOGASTRODUODENOSCOPY (EGD) WITH PROPOFOL N/A 01/15/2020   Procedure: ESOPHAGOGASTRODUODENOSCOPY (EGD) WITH PROPOFOL;  Surgeon: Ladene Artist, MD;  Location: [facility] ENDOSCOPY;  Service: Endoscopy;  Laterality: N/A;   ESOPHAGOGASTRODUODENOSCOPY (EGD) WITH PROPOFOL N/A 09/02/2020   Procedure: ESOPHAGOGASTRODUODENOSCOPY (EGD) WITH PROPOFOL;  Surgeon: Gatha Mayer, MD;  [facility];  Service: Endoscopy;  Laterality: N/A;  with APC   ESOPHAGOGASTRODUODENOSCOPY (EGD) WITH PROPOFOL N/A 11/21/2020   Procedure: ESOPHAGOGASTRODUODENOSCOPY (EGD) WITH PROPOFOL;  Surgeon: Gatha Mayer,  MD;  [facility]: [facility];  Service: Endoscopy;  Laterality: N/A;   EYE SURGERY Bilateral    "has macular edema cauterized" (04/27/2018)   GASTRIC VARICES BANDING  11/21/2020   Procedure: GASTRIC VARICES BANDING;  Surgeon: Gatha Mayer, MD;  Location: [facility];  Service: Endoscopy;;   GI RADIOFREQUENCY ABLATION  09/02/2020   Procedure: GI RADIOFREQUENCY ABLATION;  Surgeon: Gatha Mayer, MD;  Location: [facility];  Service: Endoscopy;;   HEMOSTASIS CLIP PLACEMENT  01/15/2020   Procedure: HEMOSTASIS CLIP PLACEMENT;  Surgeon: Ladene Artist, MD;  Location: [place];  Service: Endoscopy;;   HOT HEMOSTASIS N/A 01/12/2020   Procedure: HOT HEMOSTASIS (ARGON PLASMA COAGULATION/BICAP);  Surgeon: Zenovia Jarred  M, MD;  Location: [place];  Service: Gastroenterology;  Laterality: N/A;   HOT HEMOSTASIS N/A 01/15/2020   Procedure: HOT HEMOSTASIS (ARGON PLASMA COAGULATION/BICAP);  Surgeon: Ladene Artist, MD;  Location: [facility] ENDOSCOPY;  Service: Endoscopy;  Laterality: N/A;   INGUINAL HERNIA REPAIR Right    LEFT HEART CATHETERIZATION WITH CORONARY ANGIOGRAM N/A 03/09/2012   Procedure: LEFT HEART CATHETERIZATION WITH CORONARY ANGIOGRAM;  Surgeon: Wellington Hampshire, MD;  Location: [place] CATH LAB;  Service: Cardiovascular;  Laterality: N/A;   PACEMAKER IMPLANT N/A 01/26/2020   Procedure: PACEMAKER IMPLANT;  Surgeon: Vickie Epley, MD;  Location: [place] CV LAB;  Service: Cardiovascular;  Laterality: N/A;   PERCUTANEOUS CORONARY STENT INTERVENTION (PCI-S)  03/09/2012   Procedure: PERCUTANEOUS CORONARY STENT INTERVENTION (PCI-S);  Surgeon: Wellington Hampshire, MD;  Location: [facility] CATH LAB;  Service: Cardiovascular;;   POLYPECTOMY  01/15/2020   Procedure: POLYPECTOMY;  Surgeon: Ladene Artist, MD;  Location: [place];  Service: Endoscopy;;   RIGHT/LEFT HEART CATH AND CORONARY ANGIOGRAPHY N/A 04/25/2018   Procedure: RIGHT/LEFT HEART CATH AND CORONARY ANGIOGRAPHY;  Surgeon: Wellington Hampshire, MD;   Location: [place] CV LAB;  Service: Cardiovascular;  Laterality: N/A;     Social History   reports that he has never smoked. He has never used smokeless tobacco. He reports current alcohol use. He reports that he does not use drugs.   Family History   His family history includes Bladder Cancer in his mother; COPD in his mother; Cancer in an other family member; Heart attack in his father; Kidney disease in his paternal uncle; Liver cancer in his maternal aunt; Lung cancer in his maternal aunt. There is no history of Prostate cancer or Kidney cancer.   Allergies No Known Allergies   Home Medications  Prior to Admission medications   Medication Sig Start Date End Date Taking? Authorizing Provider  ALPRAZolam Duanne Moron) 0.25 MG tablet Take 0.25 mg by mouth daily as needed for anxiety.   Yes [provider]  amiodarone (PACERONE) 200 MG tablet Take 200 mg by mouth 2 (two) times daily.   Yes [provider]  amLODipine (NORVASC) 5 MG tablet Take one tablet by mouth one time daily 07/25/14  Yes Wellington Hampshire, MD  apixaban (ELIQUIS) 5 MG TABS tablet Take 1 tablet (5 mg total) by mouth 2 (two) times daily. 09/10/20  Yes Wellington Hampshire, MD  aspirin EC 81 MG EC tablet Take 1 tablet (81 mg total) by mouth daily. Swallow whole. 01/17/20  Yes Vashti Hey, MD  Cyanocobalamin (B-12) 1000 MCG CAPS Take 1,000 mcg by mouth every evening.    Yes [provider]  furosemide (LASIX) 20 MG tablet Take 20 mg by mouth daily as needed for edema.   Yes [provider]  glimepiride (AMARYL) 4 MG tablet Take 4 mg by mouth daily.   Yes [provider]  insulin glargine (LANTUS) 100 UNIT/ML injection Inject 15 Units into the skin at bedtime.   Yes [provider]  isosorbide mononitrate (IMDUR) 30 MG 24 hr tablet TAKE 1 TABLET BY MOUTH DAILY. 12/10/20  Yes Wellington Hampshire, MD  metoprolol tartrate (LOPRESSOR) 25 MG tablet Take 1 tablet (25 mg) by  mouth twice daily as needed for palpitations 02/09/20  Yes Dunn, Ryan M, PA-C  NOVOLOG FLEXPEN 100 UNIT/ML FlexPen Inject 3-30 Units into the skin 3 (three) times daily with meals. 05/26/17  Yes [provider]  Polyethylene Glycol 400 (BLINK TEARS OP) Place  1 drop into both eyes daily as needed (dry eyes).   Yes [provider]  ramipril (ALTACE) 10 MG capsule Take 10 mg by mouth daily.  09/17/15  Yes [provider]  rosuvastatin (CRESTOR) 40 MG tablet TAKE ONE TABLET BY MOUTH EVERY DAY 11/29/20  Yes Wellington Hampshire, MD  sucralfate (CARAFATE) 1 g tablet Take 1 g by mouth 2 (two) times daily. 08/23/19  Yes [provider]  vitamin C (ASCORBIC ACID) 500 MG tablet Take 500 mg by mouth every evening.   Yes [provider]  Ferrous Gluconate-C-Folic Acid (IRON-C PO) Take 1 tablet by mouth daily.    [provider]  Omega-3 Fatty Acids (FISH OIL) 1000 MG CAPS Take 1,000 mg by mouth every evening.    [provider]  pantoprazole (PROTONIX) 40 MG tablet Take 1 tablet (40 mg total) by mouth daily. 12/28/19 03/08/29  Para Skeans, MD  Scheduled Meds:  docusate  100 mg Per Tube BID   fentaNYL (SUBLIMAZE) injection  25 mcg Intravenous Once   insulin aspart  0-15 Units Subcutaneous Q4H   [START ON 12/21/2020] pantoprazole  40 mg Intravenous Q12H   polyethylene glycol  17 g Per Tube Daily   Continuous Infusions:  fentaNYL infusion INTRAVENOUS     norepinephrine (LEVOPHED) Adult infusion     octreotide  (SANDOSTATIN)    IV infusion 50 mcg/hr (12/30/20 0301)   pantoprazole 8 mg/hr (12/30/20 0138)   propofol (DIPRIVAN) infusion     prothrombin complex conc human (Kcentra) IVPB     PRN Meds:.docusate sodium, polyethylene glycol  Active Hospital Problem list   Acute respiratory failure Hemorrhagic Shock Acute Blood loss Anemia Acute GI Bleed AKI on CKD PAF CAD DM Assessment & Plan:  Hemorrhagic shock Acute GI Bleed Acute blood loss anemia  in the setting of GAVE (gastric antral venous ectasia-watermelon stomach) associated with chronic GI blood loss and frequently iron deficiency anemia  - At least 2x IV access, 18 gauge or larger - IVF resuscitation to maintain MAP>65 - H&H monitoring q6h - Transfuse PRN Hgb<7 - Consider Massive Transfusion Protocol option or empiric FFP+Platelets if needing more than 2-4 units PRBC if ,assive bleeding persistent - Pantoprazole '80mg'$  IV x1 then gtt '8mg'$ /hr - Octreotide 35mg IV x1 then 574m/hr drip - NPO for pending endoscopy - GI Consult for EGD/Colonoscopy - CT angio pending to isolate source. May need IR Consult for embolization or General surgery - Hold NSAIDs, steroids, ASA, Eliquis - Will start (SBPeritonitis) Antibiotic Prophylaxis with ceftriaxone 1g IV daily per GI commendation  Mechanical ventilation for Airway Protection due to excessive Hematemesis and risk for aspiration - PRVC  8 mL/kg, continue ventilator support & lung protective strategies - Wean PEEP & FiO2 as tolerated, maintain SpO2 > 90% - Head of bed elevated 30 degrees, VAP protocol in place - Plateau pressures less than 30 cm H20  - Intermittent chest x-ray & ABG PRN - Daily WUA with SBT as tolerated  - Ensure adequate pulmonary hygiene  - F/u cultures, trend PCT - PAD protocol in place: continue Fentanyl gtt & Propofol   AKI on CKD Stage III - Monitor I&O's / urinary output - Follow BMP - Ensure adequate renal perfusion - Avoid nephrotoxic agents as able - Replace electrolytes as indicated   Diabetes mellitus - CBGs - Sliding scale insulin - Follow ICU hyper/hypoglycemia protocol - Hold home Amaryl    Paroxysmal atrial fibrillation -Hypertension Hx: CAD s/p CABG in September 2011 with subsequent  bare metal stenting of the right coronary artery in 2013 MI, HLD  - Continuous cardiac monitoring - Maintain MAP greater than 65 - Amiodarone for rate control - Hold Eliquis in the setting of GI bleed - Hold  home Lasix, Norvasc, metoprolol for now   OSA on CPAP at home   Best practice:  Diet:  NPO Pain/Anxiety/Delirium protocol (if indicated): Yes (RASS goal 0) VAP protocol (if indicated): Yes DVT prophylaxis: Contraindicated GI prophylaxis: PPI Glucose control:  SSI Yes Central venous access:  Yes, and it is still needed Arterial line:  N/A Foley:  Yes, and it is still needed Mobility:  bed rest  PT consulted: N/A Last date of multidisciplinary goals of care discussion [7/27] Code Status:  full code Disposition: ICU   = Goals of Care = Code Status Order: FULL  Primary Emergency Contact: Dulworth,Mary, Home Phone: [phone] Wishes to pursue full aggressive treatment and intervention options, including CPR and intubation,  goals of care will be addressed on going with family if that should become necessary.  Critical care time: 45 minutes     Rufina Falco, DNP, CCRN, FNP-C, AGACNP-BC Acute Care Nurse Practitioner  [facility] Pager: [phone] [place] [facility]  .

## 2020-12-23 NOTE — Progress Notes (Signed)
Met with family of patient a few times today, large family and friend presence in waiting area. Called later to provide support after extubation. Checked in with family, no needs expressed at the moment. Please contact chaplain if and when needed.

## 2020-12-23 NOTE — Telephone Encounter (Signed)
I spoke with Mary.

## 2020-12-23 NOTE — Progress Notes (Signed)
Inpatient Diabetes Program Recommendations  AACE/ADA: New Consensus Statement on Inpatient Glycemic Control (2015)  Target Ranges:  Prepandial:   less than 140 mg/dL      Peak postprandial:   less than 180 mg/dL (1-2 hours)      Critically ill patients:  140 - 180 mg/dL   Lab Results  Component Value Date   GLUCAP 373 (H) January 07, 2021   HGBA1C 7.5 (H) 11/01/2020    Review of Glycemic Control Results for QUALYN, PONZIO (MRN [id number]) as of 01/07/21 11:52  Ref. Range 2021-01-07 04:27 07-Jan-2021 11:22  Glucose-Capillary Latest Ref Range: 70 - 99 mg/dL 341 (H) 373 (H)   Diabetes history: DM 2 Outpatient Diabetes medications:  Lantus 15 units daily, Amaryl 4 mg daily, Novolog 3-30 units tid with meals Current orders for Inpatient glycemic control:  Novolog moderate q 4 hours  Inpatient Diabetes Program Recommendations:    Note blood sugars >300 mg/dL.  Please consider adding Levemir 10 units bid. IV insulin may be needed if blood sugars continue >goal.   Thanks,  Adah Perl, RN, BC-ADM Inpatient Diabetes Coordinator Pager [phone]  (8a-5p)

## 2020-12-23 NOTE — Progress Notes (Signed)
Initial Nutrition Assessment  DOCUMENTATION CODES:   Not applicable  INTERVENTION:   If tube feeds initiated, recommend:  Vital 1.2 '@55ml'$ /hr- Initiate at 1m/hr and increase by 135mhr q 8 hours until goal rate is reached  Pro-Source 4567mID via tube, provides 40kcal and 11g of protein per serving   Free water flushes 96m66m hours to maintain tube patency   Regimen provides 1664kcal/day, 121g/day protein and 1250ml64m free water   NUTRITION DIAGNOSIS:   Inadequate oral intake related to inability to eat (pt sedated and ventilated) as evidenced by NPO status.  GOAL:   Provide needs based on ASPEN/SCCM guidelines  MONITOR:   Vent status, Labs, Weight trends, Skin, I & O's  REASON FOR ASSESSMENT:   Ventilator    ASSESSMENT:   74 y/33male with h/o CKD III, DM, HTN, CAD, NSTEMI, OSA, Afib and GAVE s/p RFA, APC and banding who is admitted with GIB now s/p EGD and emergent embolization 7/27  Unable to see pt today as pt in procedure at time of RD visit. Pt currently sedated and intubated. NGT in place to continuous suction. Pt s/p embolization today by vascular. No plans for tube feeds today. Per chart, pt appears weight stable at baseline. RD suspects pt with good appetite and oral intake at baseline.   Medications reviewed and include: colace, insulin, protonix, miralax, levophed, octreotide, propofol, Na Bicarbonate   Labs reviewed: K 5.5(H), BUN 32(H), creat 1.78(H), P 6.3(H), Mg 2.1 wnl Hgb 10.4(L), Hct 30.2(L) Cbgs- 341, 380, 373 x 24 hrs AIC 7.5(H)- 6/10  Patient is currently intubated on ventilator support MV: 8.5 L/min Temp (24hrs), Avg:97.4 F (36.3 C), Min:97 F (36.1 C), Max:97.9 F (36.6 C)  Propofol: 14.69 ml/hr  MAP- >65mmH16mOP- 250ml  65mRITION - FOCUSED PHYSICAL EXAM: Unable to perform at this time   Diet Order:   Diet Order     None      EDUCATION NEEDS:   No education needs have been identified at this time  Skin:  Skin  Assessment: Reviewed RN Assessment (ecchymosis)  Last BM:  pta  Height:   Ht Readings from Last 1 Encounters:  07/27/22022/08/029" (1.727 m)    Weight:   Wt Readings from Last 1 Encounters:  07/27/208/02/22g    Ideal Body Weight:  70 kg  BMI:  Body mass index is 27.38 kg/m.  Estimated Nutritional Needs:   Kcal:  1639kcal/day  Protein:  105-120g/day  Fluid:  1.8-2.1L/day  Callan Yontz CKoleen Distance, LDN Please refer to [facility] and/or RD on-call/weekend/after hours pager

## 2020-12-23 NOTE — Progress Notes (Signed)
Seth Hardy notified of lactic 4.6

## 2020-12-23 NOTE — Progress Notes (Signed)
Patient transported to vascular lab for procedure by primary nurse, Amy Monica Becton, charge nurse and Pam the orderly.

## 2020-12-23 NOTE — Progress Notes (Signed)
Seth Hardy notified of potassium of 5.5

## 2020-12-23 NOTE — ED Provider Notes (Addendum)
[facility] Emergency Department Provider Note  ____________________________________________   Event Date/Time   First MD Initiated Contact with Patient 24-Dec-2020 0019     (approximate)  I have reviewed the triage vital signs and the nursing notes.   HISTORY  Chief Complaint Hematemesis   HPI Seth Hardy is a 75 y.o. male with a past medical history of CAD, CKD, HTN, HDL, DM, aortic stenosis, paroxysmal A. fib on amiodarone and Eliquis as well as history of upper GI bleed who presents via EMS from home for assessment of vomiting with blood tonight.  Patient states started fairly suddenly and that he has not had any shortness of breath, cough, chest pain, abdominal pain, blood in his stool or any melanotic stools lately.  He denies any injuries or falls, headache, earache, sore throat or other recent sick symptoms.  He states he did take his Eliquis this evening.  States he has never had vomiting like he is having tonight.  Denies any EtOH use recently or history of varices.         Past Medical History:  Diagnosis Date   Basal cell carcinoma of skin 2012   removed several spots from arms and back   CAD (coronary artery disease)    a. 01/2010 CABG x 4: LIMA->LAD, VG->D1, VG->OM1, VG->PDA. b. NSTEMI 02/2012 in setting of AF-RVR with cath s/p BMS to RCA (plan for 1 month, possibly up to 3 months of Plavix)   Carotid disease, bilateral ([place])    a. 03/2011 U/S: 40-59% bilat Carotid dzs;  b. 04/2015 Carotid U/S: 40-59% bilat ICA stenosis; c. 05/2015 CTA Neck: [id number] RICA, [id number] LICA, mod-marked R vertebral stenosis, mod L vertebral stenosis.   Chronic kidney disease    small obtusion per pcp. ultrasound done on 08/29/2015   Diabetes mellitus type II, controlled ([place])    a. Variable CBG 02/2012 - several meds adjusted.   Dysrhythmia    intermittent Atrial Fibrillation   GERD (gastroesophageal reflux disease)    Heart murmur    Hyperlipidemia    Hypertension     Hypertensive heart disease    Iron deficiency anemia    "gets infusions q once in awhile" (04/27/2018)   Iron deficiency anemia 08/2015   received 2 units rbc one week ago, 3 IV iron infusion -last 1 week.   Macular edema bil   lazer work done previously   Mild aortic stenosis    a. 03/2011 Echo: EF 55-60%, Mild AS. b. Mild by cath 02/2012; c. 01/2014 Echo: EF 65-70%, Gr 1 DD, mild AS, mildly dil LA.   Multiple gastric ulcers 2006   Myocardial infarction [facility]) 02/2012   stents (x1) at time   Myocardial infarction [facility])    "2nd one was after 02/2012; don't know date" (04/27/2018)   Obesity    On home oxygen therapy    "2.5 w/CPAP" (04/27/2018) history of   OSA on CPAP    CPAP settings 3 with oxygen 2.5 (04/27/2018)   PAF (paroxysmal atrial fibrillation) ([place])    a. Newly dx 02/2012 & initiated on Coumadin (spont converted to NSR).   Presence of permanent cardiac pacemaker    Shortness of breath dyspnea    Transfusion history    transfusions -1 month ago -2 units    Patient Active Problem List   Diagnosis Date Noted   Acute GI bleeding 12/24/2020   Heme positive stool    Melena    GAVE (gastric antral vascular ectasia)  UGI bleed 01/11/2020   Atrial fibrillation with RVR (HCC)    Rapid atrial fibrillation ([place]) 12/26/2019   CAD (coronary artery disease), native coronary artery 04/27/2018   Effort angina (HCC)    Benign essential hypertension 02/11/2016   Microcytic anemia    Gastric polyps    BP (high blood pressure) 09/25/2015   Aortic heart valve narrowing 09/23/2015   Carotid artery stenosis with cerebral infarction ([place]) 08/30/2015   Iron deficiency anemia due to chronic blood loss 08/21/2015   B12 deficiency 08/19/2015   Carotid disease, bilateral ([place])    CAD (coronary artery disease)    Mild aortic stenosis    Hypertensive heart disease    Diabetes mellitus type II, controlled ([place])    Hyperlipidemia 03/14/2015   Carotid stenosis, asymptomatic, left 09/13/2014    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 ([place]) [id number]   History of atrial fibrillation 01/04/2014   Obstructive apnea 01/04/2014   Atrial fibrillation ([place]) 03/23/2012   Non-STEMI (non-ST elevated myocardial infarction) ([place]) 03/09/2012   Carotid stenosis 05/28/2010   CAD, ARTERY BYPASS GRAFT 04/07/2010   CAD, NATIVE VESSEL 03/13/2009   Aortic valve stenosis, mild 11/01/2008    Past Surgical History:  Procedure Laterality Date   ANGIOPLASTY     BASAL CELL CARCINOMA EXCISION     "shoulder, arm X 2"   BIOPSY  09/02/2020   Procedure: BIOPSY;  Surgeon: Gatha Mayer, MD;  Location: [facility] ENDOSCOPY;  Service: Endoscopy;;   CARDIAC CATHETERIZATION  2014   CARDIAC CATHETERIZATION  2021   [facility] x1 stent   CATARACT EXTRACTION W/ INTRAOCULAR LENS  IMPLANT, BILATERAL Bilateral 2016   COLONOSCOPY WITH PROPOFOL N/A 10/15/2015   Procedure: COLONOSCOPY WITH PROPOFOL;  Surgeon: Gatha Mayer, MD;  Location: [facility];  Service: Endoscopy;  Laterality: N/A;   CORONARY ANGIOPLASTY     Dr. Fletcher Anon   CORONARY ARTERY BYPASS GRAFT  2011   CABG X3-4; [facility], [place], [place],    CORONARY ATHERECTOMY  04/27/2018   CORONARY ATHERECTOMY N/A 04/27/2018   Procedure: CORONARY ATHERECTOMY;  Surgeon: Belva Crome, MD;  Location: [place] CV LAB;  Service: Cardiovascular;  Laterality: N/A;   cyst L kidney  [place] Left 08/30/2015   Procedure: ENDARTERECTOMY CAROTID;  Surgeon: Katha Cabal, MD;  Location: [facility] ORS;  Service: Vascular;  Laterality: Left;   ESOPHAGOGASTRODUODENOSCOPY (EGD) WITH PROPOFOL N/A 10/15/2015   Procedure: ESOPHAGOGASTRODUODENOSCOPY (EGD) WITH PROPOFOL;  Surgeon: Gatha Mayer, MD;  Location: [facility];  Service: Endoscopy;  Laterality: N/A;   ESOPHAGOGASTRODUODENOSCOPY (EGD) WITH PROPOFOL N/A 01/12/2020   Procedure: ESOPHAGOGASTRODUODENOSCOPY (EGD) WITH PROPOFOL;  Surgeon: Jerene Bears, MD;   Location: [facility] ENDOSCOPY;  Service: Gastroenterology;  Laterality: N/A;   ESOPHAGOGASTRODUODENOSCOPY (EGD) WITH PROPOFOL N/A 01/15/2020   Procedure: ESOPHAGOGASTRODUODENOSCOPY (EGD) WITH PROPOFOL;  Surgeon: Ladene Artist, MD;  Location: [facility] ENDOSCOPY;  Service: Endoscopy;  Laterality: N/A;   ESOPHAGOGASTRODUODENOSCOPY (EGD) WITH PROPOFOL N/A 09/02/2020   Procedure: ESOPHAGOGASTRODUODENOSCOPY (EGD) WITH PROPOFOL;  Surgeon: Gatha Mayer, MD;  Location: [facility];  Service: Endoscopy;  Laterality: N/A;  with APC   ESOPHAGOGASTRODUODENOSCOPY (EGD) WITH PROPOFOL N/A 11/21/2020   Procedure: ESOPHAGOGASTRODUODENOSCOPY (EGD) WITH PROPOFOL;  Surgeon: Gatha Mayer, MD;  Location: [facility];  Service: Endoscopy;  Laterality: N/A;   EYE SURGERY Bilateral    "has macular edema cauterized" (04/27/2018)   GASTRIC VARICES BANDING  11/21/2020   Procedure: GASTRIC VARICES BANDING;  Surgeon:  Gatha Mayer, MD;  Location: [facility];  Service: Endoscopy;;   GI RADIOFREQUENCY ABLATION  09/02/2020   Procedure: GI RADIOFREQUENCY ABLATION;  Surgeon: Gatha Mayer, MD;  Location: [facility];  Service: Endoscopy;;   HEMOSTASIS CLIP PLACEMENT  01/15/2020   Procedure: HEMOSTASIS CLIP PLACEMENT;  Surgeon: Ladene Artist, MD;  Location: [place];  Service: Endoscopy;;   HOT HEMOSTASIS N/A 01/12/2020   Procedure: HOT HEMOSTASIS (ARGON PLASMA COAGULATION/BICAP);  Surgeon: Jerene Bears, MD;  Location: [facility] ENDOSCOPY;  Service: Gastroenterology;  Laterality: N/A;   HOT HEMOSTASIS N/A 01/15/2020   Procedure: HOT HEMOSTASIS (ARGON PLASMA COAGULATION/BICAP);  Surgeon: Ladene Artist, MD;  Location: [facility] ENDOSCOPY;  Service: Endoscopy;  Laterality: N/A;   INGUINAL HERNIA REPAIR Right    LEFT HEART CATHETERIZATION WITH CORONARY ANGIOGRAM N/A 03/09/2012   Procedure: LEFT HEART CATHETERIZATION WITH CORONARY ANGIOGRAM;  Surgeon: Wellington Hampshire, MD;  Location: [place] CATH LAB;  Service: Cardiovascular;  Laterality: N/A;    PACEMAKER IMPLANT N/A 01/26/2020   Procedure: PACEMAKER IMPLANT;  Surgeon: Vickie Epley, MD;  Location: [place] CV LAB;  Service: Cardiovascular;  Laterality: N/A;   PERCUTANEOUS CORONARY STENT INTERVENTION (PCI-S)  03/09/2012   Procedure: PERCUTANEOUS CORONARY STENT INTERVENTION (PCI-S);  Surgeon: Wellington Hampshire, MD;  Location: [facility] CATH LAB;  Service: Cardiovascular;;   POLYPECTOMY  01/15/2020   Procedure: POLYPECTOMY;  Surgeon: Ladene Artist, MD;  Location: [place];  Service: Endoscopy;;   RIGHT/LEFT HEART CATH AND CORONARY ANGIOGRAPHY N/A 04/25/2018   Procedure: RIGHT/LEFT HEART CATH AND CORONARY ANGIOGRAPHY;  Surgeon: Wellington Hampshire, MD;  Location: [place] CV LAB;  Service: Cardiovascular;  Laterality: N/A;    Prior to Admission medications   Medication Sig Start Date End Date Taking? Authorizing Provider  ALPRAZolam Duanne Moron) 0.25 MG tablet Take 0.25 mg by mouth daily as needed for anxiety.   Yes [provider]  amiodarone (PACERONE) 200 MG tablet Take 200 mg by mouth 2 (two) times daily.   Yes [provider]  amLODipine (NORVASC) 5 MG tablet Take one tablet by mouth one time daily 07/25/14  Yes Wellington Hampshire, MD  apixaban (ELIQUIS) 5 MG TABS tablet Take 1 tablet (5 mg total) by mouth 2 (two) times daily. 09/10/20  Yes Wellington Hampshire, MD  aspirin EC 81 MG EC tablet Take 1 tablet (81 mg total) by mouth daily. Swallow whole. 01/17/20  Yes Vashti Hey, MD  Cyanocobalamin (B-12) 1000 MCG CAPS Take 1,000 mcg by mouth every evening.    Yes [provider]  furosemide (LASIX) 20 MG tablet Take 20 mg by mouth daily as needed for edema.   Yes [provider]  glimepiride (AMARYL) 4 MG tablet Take 4 mg by mouth daily.   Yes [provider]  insulin glargine (LANTUS) 100 UNIT/ML injection Inject 15 Units into the skin at bedtime.   Yes [provider]  isosorbide mononitrate (IMDUR) 30 MG 24 hr tablet TAKE 1  TABLET BY MOUTH DAILY. 12/10/20  Yes Wellington Hampshire, MD  metoprolol tartrate (LOPRESSOR) 25 MG tablet Take 1 tablet (25 mg) by mouth twice daily as needed for palpitations 02/09/20  Yes Dunn, Ryan M, PA-C  NOVOLOG FLEXPEN 100 UNIT/ML FlexPen Inject 3-30 Units into the skin 3 (three) times daily with meals. 05/26/17  Yes [provider]  Polyethylene Glycol 400 (BLINK TEARS OP) Place 1 drop into both eyes daily as needed (dry eyes).   Yes [provider]  ramipril (  ALTACE) 10 MG capsule Take 10 mg by mouth daily.  09/17/15  Yes [provider]  rosuvastatin (CRESTOR) 40 MG tablet TAKE ONE TABLET BY MOUTH EVERY DAY 11/29/20  Yes Wellington Hampshire, MD  sucralfate (CARAFATE) 1 g tablet Take 1 g by mouth 2 (two) times daily. 08/23/19  Yes [provider]  vitamin C (ASCORBIC ACID) 500 MG tablet Take 500 mg by mouth every evening.   Yes [provider]  Ferrous Gluconate-C-Folic Acid (IRON-C PO) Take 1 tablet by mouth daily.    [provider]  Omega-3 Fatty Acids (FISH OIL) 1000 MG CAPS Take 1,000 mg by mouth every evening.    [provider]  pantoprazole (PROTONIX) 40 MG tablet Take 1 tablet (40 mg total) by mouth daily. 12/28/19 03/08/29  Para Skeans, MD    Allergies Patient has no known allergies.  Family History  Problem Relation Age of Onset   COPD Mother        alive 82   Bladder Cancer Mother    Heart attack Father        74 deceased   Lung cancer Maternal Aunt    Liver cancer Maternal Aunt    Kidney disease Paternal Uncle    Cancer Other        all paternal aunts and uncles   Prostate cancer Neg Hx    Kidney cancer Neg Hx     Social History Social History   Tobacco Use   Smoking status: Never   Smokeless tobacco: Never   Tobacco comments:    tobacco use - no.no passive smoke in home  Vaping Use   Vaping Use: Never used  Substance Use Topics   Alcohol use: Yes    Comment: 04/27/2018 "couple drinks/year; if that"    Drug use: Never    Review of Systems  Review of Systems  Constitutional:  Negative for chills and fever.  HENT:  Negative for sore throat.   Eyes:  Negative for pain.  Respiratory:  Negative for cough and stridor.   Cardiovascular:  Negative for chest pain.  Gastrointestinal:  Positive for nausea and vomiting. Negative for blood in stool, constipation and melena.  Genitourinary:  Negative for dysuria.  Musculoskeletal:  Negative for myalgias.  Skin:  Negative for rash.  Neurological:  Positive for dizziness and weakness. Negative for seizures, loss of consciousness and headaches.  Psychiatric/Behavioral:  Negative for suicidal ideas.   All other systems reviewed and are negative.    ____________________________________________   PHYSICAL EXAM:  VITAL SIGNS: ED Triage Vitals  Enc Vitals Group     BP      Pulse      Resp      Temp      Temp src      SpO2      Weight      Height      Head Circumference      Peak Flow      Pain Score      Pain Loc      Pain Edu?      Excl. in [place]?    Vitals:   2021-01-07 0350 01-07-2021 0355  BP: (!) 185/74 (!) 205/85  Pulse: 77 78  Resp:    Temp:    SpO2: 100% 100%   Physical Exam Vitals and nursing note reviewed.  Constitutional:      General: He is in acute distress.     Appearance: He is well-developed. He is ill-appearing.  HENT:     Head: Normocephalic and atraumatic.     Right Ear: External ear normal.     Left Ear: External ear normal.     Nose: Nose normal.     Mouth/Throat:     Mouth: Mucous membranes are dry.  Eyes:     Conjunctiva/sclera: Conjunctivae normal.  Cardiovascular:     Rate and Rhythm: Normal rate and regular rhythm.     Pulses: Normal pulses.     Heart sounds: No murmur heard. Pulmonary:     Effort: Pulmonary effort is normal. No respiratory distress.     Breath sounds: Normal breath sounds.  Abdominal:     Palpations: Abdomen is soft.     Tenderness: There is no abdominal tenderness.   Musculoskeletal:     Cervical back: Neck supple.  Skin:    General: Skin is warm and dry.     Capillary Refill: Capillary refill takes more than 3 seconds.     Coloration: Skin is pale.  Neurological:     Mental Status: He is alert and oriented to person, place, and time.  Psychiatric:        Mood and Affect: Mood normal.     ____________________________________________   LABS (all labs ordered are listed, but only abnormal results are displayed)  Labs Reviewed  CBC WITH DIFFERENTIAL/PLATELET - Abnormal; Notable for the following components:      Result Value   RBC 2.83 (*)    Hemoglobin 9.3 (*)    HCT 28.3 (*)    All other components within normal limits  COMPREHENSIVE METABOLIC PANEL - Abnormal; Notable for the following components:   CO2 21 (*)    Glucose, Bld 283 (*)    BUN 26 (*)    Creatinine, Ser 1.89 (*)    Calcium 8.0 (*)    Total Protein 5.3 (*)    Albumin 3.0 (*)    GFR, Estimated 37 (*)    All other components within normal limits  PROTIME-INR - Abnormal; Notable for the following components:   Prothrombin Time 16.6 (*)    INR 1.3 (*)    All other components within normal limits  RESP PANEL BY RT-PCR (FLU A&B, COVID) ARPGX2  MAGNESIUM  URINALYSIS, COMPLETE (UACMP) WITH MICROSCOPIC  CBC  BASIC METABOLIC PANEL  MAGNESIUM  PHOSPHORUS  HEMOGLOBIN AND HEMATOCRIT, BLOOD  TYPE AND SCREEN  PREPARE RBC (CROSSMATCH)  TYPE AND SCREEN   ____________________________________________  EKG  Sinus rhythm with ventricular to 60, normal axis, QTc interval of 525 without clear evidence of acute ischemia or significant underlying arrhythmia. ____________________________________________  RADIOLOGY  ED MD interpretation: Chest x-ray has no focal consolidation, overt edema, pneumothorax or other clear acute intrathoracic process.  Official radiology report(s): DG Chest Portable 1 View  Result Date: Jan 11, 2021 CLINICAL DATA:  Vomiting blood. EXAM: PORTABLE CHEST 1  VIEW COMPARISON:  January 26, 2020 FINDINGS: There is a dual lead AICD. Multiple sternal wires are noted. Mild linear atelectasis is seen within the bilateral lung bases. There is no evidence of acute infiltrate, pleural effusion or pneumothorax. The heart size and mediastinal contours are within normal limits. The visualized skeletal structures are unremarkable. IMPRESSION: Mild bibasilar linear atelectasis. Electronically Signed   By: Virgina Norfolk M.D.   On: 11-Jan-2021 02:13    ____________________________________________   PROCEDURES  Procedure(s) performed (including Critical Care):  .1-3 Lead EKG Interpretation  Date/Time: 2021-01-11 1:53 AM Performed by: Lucrezia Starch, MD Authorized by: Lucrezia Starch, MD  Interpretation: normal     ECG rate assessment: normal     Rhythm: sinus rhythm     Ectopy: none     Conduction: normal   .Critical Care  Date/Time: 2021/01/16 3:41 AM Performed by: Lucrezia Starch, MD Authorized by: Lucrezia Starch, MD   Critical care provider statement:    Critical care time (minutes):  45   Critical care was necessary to treat or prevent imminent or life-threatening deterioration of the following conditions:  Circulatory failure and shock   Critical care was time spent personally by me on the following activities:  Discussions with consultants, evaluation of patient's response to treatment, examination of patient, ordering and performing treatments and interventions, ordering and review of laboratory studies, ordering and review of radiographic studies, pulse oximetry, re-evaluation of patient's condition, obtaining history from patient or surrogate and review of old charts Procedure Name: Intubation Date/Time: 2021/01/16 3:41 AM Performed by: Lucrezia Starch, MD Pre-anesthesia Checklist: Suction available, Emergency Drugs available, Patient identified, Patient being monitored and Timeout performed Oxygen Delivery Method: Nasal  cannula Preoxygenation: Pre-oxygenation with 100% oxygen Induction Type: Rapid sequence Airway Equipment and Method: Rigid stylet and Video-laryngoscopy Placement Confirmation: ETT inserted through vocal cords under direct vision Secured at: 26 cm Tube secured with: ETT holder      ____________________________________________   INITIAL IMPRESSION / ASSESSMENT AND PLAN / ED COURSE     Patient presents with above to history exam for evaluation of sudden onset of gross hematemesis.  This is associated with dizziness and weakness in the setting of patient being anticoagulated Eliquis with him taking 5 mg dose earlier this evening.  He has had bleeding before for gastric antral vascular ectasias and a polyp in the past and has been banded.  On arrival he is afebrile and hemodynamically stable although appears quite ill and is very pale and actively vomiting bright red blood.  He appeared to vomit almost a liter within 5 minutes of arrival.  His abdomen is soft nontender throughout and is not peritonitis.  He is currently maintaining his airway.  Large-bore access immediately obtained and patient started on Protonix and octreotide.  ECG is reassuring with exception of QTc interval which is fairly prolonged unfortunately precludes Zofran.  Will give 0.5 of Ativan.  CMP remarkable for glucose of 283 without other significant electrolyte or metabolic derangements.  CBC without leukocytosis but evidence of acute anemia with a hemoglobin of 9.3 compared to 12.80-monthago.  Magnesium is WNL..  Platelets are normal.  INR is unremarkable.  Type and screen sent.  Chest x-ray is unremarkable.  I suspect recurrence of bleeding from antral ectasia versus polyp.  No history of varices discussed with variceal bleeding at this time.  There is no history of melanotic or bloody stools suggest lower GI bleed this time.  Lower suspicion for acute peptic ulcer disease or gastritis.   Discussed with on-call  gastroenterologist Dr. TBonna Gainswho agreed with Protonix and pretreat tried recommended ICU admission for close observation overnight as if patient had return of his emesis and clinically declined electing to be emergently intubated for emergent endoscopy but he did not have recurrence of his vomiting would likely be stable for less emergent work-up tomorrow morning.  Patient was observed for approximately 1/2 hours and had no recurrence of vomiting.  He was a little sleepy on the reassessments a believe secondary to the Ativan but was easily arousable.  He will be admitted to the ICU service for further evaluation management.  Well-meaning ICU bed in the ED patient continued to have vomiting of bloody emesis.  Pressures continue to trend downward.  Her son is bedside by RN and made the determination that he was no longer stable enough to continue without protecting his airway.  He was emergently intubated and 2 units of emergency release PRBCs were ordered as well as reversal of his Eliquis.  GI and admitting ICU provider notified.  Post intubation pressure improved.  SPO2 100% on vent.       ____________________________________________   FINAL CLINICAL IMPRESSION(S) / ED DIAGNOSES  Final diagnoses:  Hematemesis with nausea  Anticoagulated    Medications  pantoprozole (PROTONIX) 80 mg /NS 100 mL infusion (8 mg/hr Intravenous New Bag/Given 24-Dec-2020 0138)  pantoprazole (PROTONIX) injection 40 mg (has no administration in time range)  octreotide (SANDOSTATIN) 500 mcg in sodium chloride 0.9 % 250 mL (2 mcg/mL) infusion (50 mcg/hr Intravenous New Bag/Given 12-24-2020 0301)  insulin aspart (novoLOG) injection 0-15 Units (8 Units Subcutaneous Given December 24, 2020 0311)  docusate sodium (COLACE) capsule 100 mg (has no administration in time range)  polyethylene glycol (MIRALAX / GLYCOLAX) packet 17 g (has no administration in time range)  prothrombin complex conc human (KCENTRA) IVPB 4,420 Units (has no  administration in time range)  docusate (COLACE) 50 MG/5ML liquid 100 mg (has no administration in time range)  polyethylene glycol (MIRALAX / GLYCOLAX) packet 17 g (has no administration in time range)  propofol (DIPRIVAN) 1000 MG/100ML infusion (has no administration in time range)  fentaNYL (SUBLIMAZE) injection 25 mcg (has no administration in time range)  fentaNYL 2567mg in NS 2517m(1018mml) infusion-PREMIX (has no administration in time range)  norepinephrine (LEVOPHED) '4mg'$  in 250m68memix infusion (has no administration in time range)  0.9 %  sodium chloride infusion (10 mL/hr Intravenous New Bag/Given 11/2706/02/221)  LORazepam (ATIVAN) injection 0.5 mg (0.5 mg Intravenous Given 11/2706-02-20220)  pantoprazole (PROTONIX) 80 mg /NS 100 mL IVPB (0 mg Intravenous Stopped 11/2706/02/20224)     ED Discharge Orders     None        Note:  This document was prepared using Dragon voice recognition software and may include unintentional dictation errors.    SmitLucrezia Starch 07/22022/08/024    SmitLucrezia Starch 07/2August 02, [phone]

## 2020-12-23 NOTE — Procedures (Signed)
Arterial Catheter Insertion Procedure Note  Seth Hardy  [id number]  06-11-1945  Date:December 28, 2020  Time:6:41 AM    Provider Performing: Karen Kays    Procedure: Insertion of Arterial Line [phone]) with US guidance [id number])   Indication(s) Blood pressure monitoring and/or need for frequent ABGs  Consent Risks of the procedure as well as the alternatives and risks of each were explained to the patient and/or caregiver.  Consent for the procedure was obtained and is signed in the bedside chart  Anesthesia None   Time Out Verified patient identification, verified procedure, site/side was marked, verified correct patient position, special equipment/implants available, medications/allergies/relevant history reviewed, required imaging and test results available.   Sterile Technique Maximal sterile technique including full sterile barrier drape, hand hygiene, sterile gown, sterile gloves, mask, hair covering, sterile ultrasound probe cover (if used).   Procedure Description Area of catheter insertion was cleaned with chlorhexidine and draped in sterile fashion. With real-time ultrasound guidance an arterial catheter was placed into the left femoral artery.  Appropriate arterial tracings confirmed on monitor.     Complications/Tolerance None; patient tolerated the procedure well.   EBL Minimal   Specimen(s) None  Rufina Falco, DNP, CCRN, FNP-C, AGACNP-BC Acute Care Nurse Practitioner  [facility] Pager: [phone] [place] at [facility]

## 2020-12-23 NOTE — Progress Notes (Signed)
Pt actively bleeding again and hypotensive, therefore vasopressin increased.  Notified Vascular Surgeon Dr. Lucky Cowboy and General Surgery Dr. Lysle Pearl.  Dr. Lysle Pearl currently at bedside. Pts wife notified and instructed to come back to the hospital, she is currently en route. Repeat hgb 6.9 and fibrinogen level 293.  Pt to receive 2 units of FFP; 2 pools of cryoprecipitate; and 2 units of pRBC's.  Case discussed with ICU Intensivist Dr. Galen Daft. Will continue to monitor and assess pt.  Rosilyn Mings, AGNP  Pulmonary/Critical Care Pager [phone] (please enter 7 digits) PCCM Consult Pager [phone] (please enter 7 digits)

## 2020-12-23 NOTE — Consult Note (Signed)
Subjective:   CC: Gastric bleed  HPI:  Seth Hardy is a 75 y.o. male who was consulted by [facility] for issue above.  Symptoms were first noted 1 day ago.  Episodes of masses hematemesis, brought to the emergency department emergently.  Patient is status post EGD and IR embolization.  Surgery was notified prior to the IR embolization procedure to be on standby in case embolization fails.  Post procedure, embolization seem to have been successful but surgery was notified to bedside by ICU for new onset hematemesis around the NG tube.  At bedside, nursing reported increasing pressor needs to maintain MAP as well as most recent hemoglobin check showing a decrease as well.  Past Medical History:  has a past medical history of Basal cell carcinoma of skin (2012), CAD (coronary artery disease), Carotid disease, bilateral ([place]), Chronic kidney disease, Diabetes mellitus type II, controlled ([place]), Dysrhythmia, GERD (gastroesophageal reflux disease), Heart murmur, Hyperlipidemia, Hypertension, Hypertensive heart disease, Iron deficiency anemia, Iron deficiency anemia (08/2015), Macular edema (bil), Mild aortic stenosis, Multiple gastric ulcers (2006), Myocardial infarction ([place]) (02/2012), Myocardial infarction ([place]), Obesity, On home oxygen therapy, OSA on CPAP, PAF (paroxysmal atrial fibrillation) ([place]), Presence of permanent cardiac pacemaker, Shortness of breath dyspnea, and Transfusion history.  Past Surgical History:  Past Surgical History:  Procedure Laterality Date   ANGIOPLASTY     BASAL CELL CARCINOMA EXCISION     "shoulder, arm X 2"   BIOPSY  09/02/2020   Procedure: BIOPSY;  Surgeon: Gatha Mayer, MD;  Location: [facility];  Service: Endoscopy;;   CARDIAC CATHETERIZATION  2014   CARDIAC CATHETERIZATION  2021   [facility] x1 stent   CATARACT EXTRACTION W/ INTRAOCULAR LENS  IMPLANT, BILATERAL Bilateral 2016   COLONOSCOPY WITH PROPOFOL N/A 10/15/2015   Procedure:  COLONOSCOPY WITH PROPOFOL;  Surgeon: Gatha Mayer, MD;  Location: [facility];  Service: Endoscopy;  Laterality: N/A;   CORONARY ANGIOPLASTY     Dr. Fletcher Anon   CORONARY ARTERY BYPASS GRAFT  2011   CABG X3-4; [facility], [place], [place],    CORONARY ATHERECTOMY  04/27/2018   CORONARY ATHERECTOMY N/A 04/27/2018   Procedure: CORONARY ATHERECTOMY;  Surgeon: Belva Crome, MD;  Location: [place] CV [facility];  Service: Cardiovascular;  Laterality: N/A;   cyst L kidney  [place] Left 08/30/2015   Procedure: ENDARTERECTOMY CAROTID;  Surgeon: Katha Cabal, MD;  Location: [facility] ORS;  Service: Vascular;  Laterality: Left;   ESOPHAGOGASTRODUODENOSCOPY (EGD) WITH PROPOFOL N/A 10/15/2015   Procedure: ESOPHAGOGASTRODUODENOSCOPY (EGD) WITH PROPOFOL;  Surgeon: Gatha Mayer, MD;  [facility]: [facility];  Service: Endoscopy;  Laterality: N/A;   ESOPHAGOGASTRODUODENOSCOPY (EGD) WITH PROPOFOL N/A 01/12/2020   Procedure: ESOPHAGOGASTRODUODENOSCOPY (EGD) WITH PROPOFOL;  Surgeon: Jerene Bears, MD;  Location: [facility] ENDOSCOPY;  Service: Gastroenterology;  Laterality: N/A;   ESOPHAGOGASTRODUODENOSCOPY (EGD) WITH PROPOFOL N/A 01/15/2020   Procedure: ESOPHAGOGASTRODUODENOSCOPY (EGD) WITH PROPOFOL;  Surgeon: Ladene Artist, MD;  Location: [facility] ENDOSCOPY;  Service: Endoscopy;  Laterality: N/A;   ESOPHAGOGASTRODUODENOSCOPY (EGD) WITH PROPOFOL N/A 09/02/2020   Procedure: ESOPHAGOGASTRODUODENOSCOPY (EGD) WITH PROPOFOL;  Surgeon: Gatha Mayer, MD;  [facility]: [facility];  Service: Endoscopy;  Laterality: N/A;  with APC   ESOPHAGOGASTRODUODENOSCOPY (EGD) WITH PROPOFOL N/A 11/21/2020   Procedure: ESOPHAGOGASTRODUODENOSCOPY (EGD) WITH PROPOFOL;  Surgeon: Gatha Mayer, MD;  [facility]: [facility];  Service: Endoscopy;  Laterality: N/A;   EYE SURGERY Bilateral    "has macular edema cauterized" (04/27/2018)  GASTRIC VARICES BANDING  11/21/2020   Procedure: GASTRIC VARICES BANDING;  Surgeon: Gatha Mayer, MD;  Location: [facility];  Service: Endoscopy;;   GI RADIOFREQUENCY ABLATION  09/02/2020   Procedure: GI RADIOFREQUENCY ABLATION;  Surgeon: Gatha Mayer, MD;  Location: [facility];  Service: Endoscopy;;   HEMOSTASIS CLIP PLACEMENT  01/15/2020   Procedure: HEMOSTASIS CLIP PLACEMENT;  Surgeon: Ladene Artist, MD;  Location: [place];  Service: Endoscopy;;   HOT HEMOSTASIS N/A 01/12/2020   Procedure: HOT HEMOSTASIS (ARGON PLASMA COAGULATION/BICAP);  Surgeon: Jerene Bears, MD;  Location: [facility] ENDOSCOPY;  Service: Gastroenterology;  Laterality: N/A;   HOT HEMOSTASIS N/A 01/15/2020   Procedure: HOT HEMOSTASIS (ARGON PLASMA COAGULATION/BICAP);  Surgeon: Ladene Artist, MD;  Location: [facility] ENDOSCOPY;  Service: Endoscopy;  Laterality: N/A;   INGUINAL HERNIA REPAIR Right    LEFT HEART CATHETERIZATION WITH CORONARY ANGIOGRAM N/A 03/09/2012   Procedure: LEFT HEART CATHETERIZATION WITH CORONARY ANGIOGRAM;  Surgeon: Wellington Hampshire, MD;  Location: [place] CATH LAB;  Service: Cardiovascular;  Laterality: N/A;   PACEMAKER IMPLANT N/A 01/26/2020   Procedure: PACEMAKER IMPLANT;  Surgeon: Vickie Epley, MD;  Location: [facility];  Service: Cardiovascular;  Laterality: N/A;   PERCUTANEOUS CORONARY STENT INTERVENTION (PCI-S)  03/09/2012   Procedure: PERCUTANEOUS CORONARY STENT INTERVENTION (PCI-S);  Surgeon: Wellington Hampshire, MD;  Location: [facility] CATH LAB;  Service: Cardiovascular;;   POLYPECTOMY  01/15/2020   Procedure: POLYPECTOMY;  Surgeon: Ladene Artist, MD;  Location: [place];  Service: Endoscopy;;   RIGHT/LEFT HEART CATH AND CORONARY ANGIOGRAPHY N/A 04/25/2018   Procedure: RIGHT/LEFT HEART CATH AND CORONARY ANGIOGRAPHY;  Surgeon: Wellington Hampshire, MD;  Location: [place] CV LAB;  Service: Cardiovascular;  Laterality: N/A;   VISCERAL ANGIOGRAPHY N/A 05-Jan-2021   Procedure: VISCERAL ANGIOGRAPHY;  Surgeon: Algernon Huxley, MD;  Location: [place] CV LAB;  Service: Cardiovascular;   Laterality: N/A;    Family History: family history includes Bladder Cancer in his mother; COPD in his mother; Cancer in an other family member; Heart attack in his father; Kidney disease in his paternal uncle; Liver cancer in his maternal aunt; Lung cancer in his maternal aunt.  Social History:  reports that he has never smoked. He has never used smokeless tobacco. He reports current alcohol use. He reports that he does not use drugs.  Current Medications:  Prior to Admission medications   Medication Sig Start Date End Date Taking? Authorizing Provider  ALPRAZolam Duanne Moron) 0.25 MG tablet Take 0.25 mg by mouth daily as needed for anxiety.   Yes [provider]  amiodarone (PACERONE) 200 MG tablet Take 200 mg by mouth 2 (two) times daily.   Yes [provider]  amLODipine (NORVASC) 5 MG tablet Take one tablet by mouth one time daily 07/25/14  Yes Wellington Hampshire, MD  apixaban (ELIQUIS) 5 MG TABS tablet Take 1 tablet (5 mg total) by mouth 2 (two) times daily. 09/10/20  Yes Wellington Hampshire, MD  Cyanocobalamin (B-12) 1000 MCG CAPS Take 1,000 mcg by mouth every evening.    Yes [provider]  Ferrous Gluconate-C-Folic Acid (IRON-C PO) Take 1 tablet by mouth daily.   Yes [provider]  furosemide (LASIX) 20 MG tablet Take 20 mg by mouth daily as needed for edema.   Yes [provider]  glimepiride (AMARYL) 4 MG tablet Take 4 mg by mouth daily.   Yes [provider]  insulin glargine (LANTUS) 100 UNIT/ML injection Inject 15  Units into the skin at bedtime.   Yes [provider]  isosorbide mononitrate (IMDUR) 30 MG 24 hr tablet TAKE 1 TABLET BY MOUTH DAILY. 12/10/20  Yes Wellington Hampshire, MD  metoprolol tartrate (LOPRESSOR) 25 MG tablet Take 1 tablet (25 mg) by mouth twice daily as needed for palpitations 02/09/20  Yes Dunn, Ryan M, PA-C  NOVOLOG FLEXPEN 100 UNIT/ML FlexPen Inject 3-30 Units into the skin 3 (three) times daily with meals.  05/26/17  Yes [provider]  Omega-3 Fatty Acids (FISH OIL) 1000 MG CAPS Take 1,000 mg by mouth every evening.   Yes [provider]  pantoprazole (PROTONIX) 40 MG tablet Take 1 tablet (40 mg total) by mouth daily. 12/28/19 03/08/29 Yes Para Skeans, MD  Polyethylene Glycol 400 (BLINK TEARS OP) Place 1 drop into both eyes daily as needed (dry eyes).   Yes [provider]  ramipril (ALTACE) 10 MG capsule Take 10 mg by mouth daily.  09/17/15  Yes [provider]  rosuvastatin (CRESTOR) 40 MG tablet TAKE ONE TABLET BY MOUTH EVERY DAY 11/29/20  Yes Wellington Hampshire, MD  sucralfate (CARAFATE) 1 g tablet Take 1 g by mouth 2 (two) times daily. 08/23/19  Yes [provider]  vitamin C (ASCORBIC ACID) 500 MG tablet Take 500 mg by mouth every evening.   Yes [provider]  aspirin EC 81 MG EC tablet Take 1 tablet (81 mg total) by mouth daily. Swallow whole. Patient not taking: Reported on 12/22/2020 01/17/20   Vashti Hey, MD  LANTUS SOLOSTAR 100 UNIT/ML Solostar Pen Inject 50 Units into the skin every morning. 10/09/20   [provider]    Allergies:  Allergies as of Dec 22, 2020   (No Known Allergies)    ROS:  Unable to obtain secondary to patient status    Objective:     BP 129/61   Pulse (!) 59   Temp 97.6 F (36.4 C) (Oral)   Resp 18   Ht 5' 7.99" (1.727 m)   Wt 81.7 kg   SpO2 100%   BMI 27.38 kg/m   Constitutional :  Intubated and sedated  Lymphatics/Throat:  no asymmetry, masses, or scars  Respiratory:  clear to auscultation bilaterally  Cardiovascular:  regular rate and rhythm  Gastrointestinal: soft, non-tender; bowel sounds normal; no masses,  no organomegaly.  NG with old blood, active hematemesis of clots as well as bright red blood around the NG tube in his mouth.  Musculoskeletal: Steady movement  Skin: Cool and moist, midline sternal and abdominal surgical scars   Psychiatric: Normal affect,  non-agitated, not confused       LABS:  CMP Latest Ref Rng & Units 2020/12/22 12-22-2020 2020-12-22  Glucose 70 - 99 mg/dL - - 384(H)  BUN 8 - 23 mg/dL - - 32(H)  Creatinine 0.61 - 1.24 mg/dL - - 1.78(H)  Sodium 135 - 145 mmol/L - - 135  Potassium 3.5 - 5.1 mmol/L 5.1 5.5(H) 5.7(H)  Chloride 98 - 111 mmol/L - - 106  CO2 22 - 32 mmol/L - - 23  Calcium 8.9 - 10.3 mg/dL - - 7.7(L)  Total Protein 6.5 - 8.1 g/dL - - -  Total Bilirubin 0.3 - 1.2 mg/dL - - -  Alkaline Phos 38 - 126 U/L - - -  AST 15 - 41 U/L - - -  ALT 0 - 44 U/L - - -   CBC Latest Ref Rng & Units December 22, 2020 12/22/2020 Dec 22, 2020  WBC 4.0 -  10.5 K/uL 12.9(H) - 8.8  Hemoglobin 13.0 - 17.0 g/dL 6.9(L) 10.4(L) 12.3(L)  Hematocrit 39.0 - 52.0 % 20.0(L) 30.2(L) 36.6(L)  Platelets 150 - 400 K/uL 127(L) 167 143(L)    RADS: N/a Assessment:  Upper GI bleed   Plan:    Had a extensive conversation with wife and 2 sons regarding his prognosis.  I explained to them that we can proceed with a very high risk surgery to attempt to identify the source of bleeding and then treat it accordingly.  Depending on the location of the source, I explained to them that he may need resections of his stomach and subsequent additional surgeries.  This is if he is able to survive his very first surgery.  He is at a very high risk of perioperative complications due to multiple comorbidities and his current labile state.  Patient's wife explicitly discussed how patient requested he did not want to undergo a prolonged hospitalization on life support.  I explained to her that there is a likely chance that may happen if we proceed with surgery and he survives the initial surgery.  Family members were all in agreement in the end that they would like to minimize any additional suffering that the patient may undergo proceeding with further interventions, so they declined surgery at this time.  Notified the ICU team of  family requests and they verbalized  understanding.  All questions and concerns addressed at this time.  Surgery will sign off.  Please call with any additional questions or concerns

## 2020-12-23 NOTE — ED Notes (Addendum)
Patient continues to vomit bright red blood with clots; patient diaphoretic, skin pale MD Creig Hines. Called to beside, decision made to place advance airway; family at beside  0329 - PRBC initiated [id number] to right hand 0330 - Time Out for procedure 0333 - PRBC unite initiated [id number] to left AC 0335 - '30mg'$  of Etomidate to left FA 0336 - '100mg'$   of Rocuronium to left FA 0337 - 7.0 ETT tube initated by MD Tamala Julian, Z. (+ color change, 26 cm at the lip, bilateral breath sounds) 0337 - 40 mcq of Phenylephrine left FA 0339 - 80 mcq of Phenylephrine left FA X-ray ordered for placement

## 2020-12-23 NOTE — Op Note (Signed)
[facility] Gastroenterology Patient Name: Seth Hardy Procedure Date: 12-26-2020 6:18 AM MRN: [id number] Account #: [id number] Date of Birth: 1945-08-11 Admit Type: Inpatient Age: 75 Room: [facility] ENDO ROOM 4 Gender: Male Note Status: Finalized Procedure:             Upper GI endoscopy Indications:           Hematemesis Providers:             Antonia Culbertson B. Bonna Gains MD, MD Referring MD:          Forest Gleason Md, MD (Referring MD) Medicines:             Monitored Anesthesia Care Complications:         No immediate complications. Procedure:             Pre-Anesthesia Assessment:                        - The risks and benefits of the procedure and the                         sedation options and risks were discussed with the                         patient. All questions were answered and informed                         consent was obtained.                        - Patient identification and proposed procedure were                         verified prior to the procedure.                        - ASA Grade Assessment: IV - A patient with severe                         systemic disease that is a constant threat to life.                        After obtaining informed consent, the endoscope was                         passed under direct vision. Throughout the procedure,                         the patient's blood pressure, pulse, and oxygen                         saturations were monitored continuously. The Endoscope                         was introduced through the mouth, and advanced to the                         second part of duodenum. The upper GI endoscopy was  accomplished with ease. The patient tolerated the                         procedure well. Findings:      The examined esophagus was normal.      Red blood was found in the gastric fundus.      A large amount of food (residue) was found in the gastric fundus.      Despite use of a  bleeder scope the stomach contents could not be cleared       completely for localization of bleeding. However, there was active       bleeding seen in the fundus/cardia region. This may represent a       dieulafoy lesion given the location. 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      The exam of the stomach was otherwise normal.      Red blood was found in the duodenal bulb and in the second portion of       the duodenum. This was cleaned with water and suction and no active       bleeding was seen in the duodenum. Impression:            - Normal esophagus.                        - Red blood in the gastric fundus.                        - A large amount of food (residue) in the stomach.                        - Despite use of a bleeder scope the stomach contents                         could not be cleared completely for localization of                         bleeding. However, there was active bleeding seen in                         the fundus/cardia region. This may represent a                         dieulafoy lesion given the location. 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                        - Blood in the duodenal bulb and in the second portion                         of the duodenum.                        - No specimens collected. Recommendation:        - -Consult vascular surgery STAT today to evaluate for  embolization. This was discussed with ICU physician                         Dr. Patsey Berthold as well.                        -Consult general surgery if pt continues to have brisk                         bleeding as well                        - Continue Serial CBCs and transfuse PRN                        - Continue present medications.                        - The findings and recommendations were discussed with                         the patient's family.                         - The findings and recommendations were discussed with                         the patient's primary physician.                        - Continue IV PPI and Octreotide drip for now                        Pt will be followed by Dr. Vicente Males this week and further                         inpatient recommendations will be updated by him in                         his chart. Procedure Code(s):     --- Professional ---                        [phone], Esophagogastroduodenoscopy, flexible,                         transoral; diagnostic, including collection of                         specimen(s) by brushing or washing, when performed                         (separate procedure) Diagnosis Code(s):     --- Professional ---                        K92.2, Gastrointestinal hemorrhage, unspecified                        K92.0, Hematemesis CPT copyright 2019 American Medical Association. All rights reserved. The codes documented in this report are preliminary and upon coder review may  be revised  to meet current compliance requirements.  Vonda Antigua, MD Margretta Sidle B. Bonna Gains MD, MD Jan 14, 2021 8:09:35 AM This report has been signed electronically. Number of Addenda: 0 Note Initiated On: January 14, 2021 6:18 AM      [facility]

## 2020-12-23 NOTE — Progress Notes (Signed)
RT at bedside to extubate patient for comfort care. Pt extubated at 2055 and placed on 2L [place].

## 2020-12-23 NOTE — Consult Note (Signed)
PHARMACY CONSULT NOTE  Pharmacy Consult for Electrolyte Monitoring and Replacement   Recent Labs: Potassium (mmol/L)  Date Value  01-15-2021 5.5 (H)  01/22/2013 4.2   Magnesium (mg/dL)  Date Value  Jan 15, 2021 2.1   Calcium (mg/dL)  Date Value  15-Jan-2021 7.7 (L)   Calcium, Total (mg/dL)  Date Value  01/22/2013 8.6   Albumin (g/dL)  Date Value  2021-01-15 3.0 (L)  01/21/2013 4.3   Phosphorus (mg/dL)  Date Value  January 15, 2021 6.3 (H)   Sodium (mmol/L)  Date Value  Jan 15, 2021 135  01/22/2013 139   Assessment: Patient is a 75 y/o M with medical history including CAD, bilateral carotid stenosis, CKD, diabetes, GERD, HTN, GAVE, IDA, gastric ulcers, Afib on apixaban who is admitted with hemorrhagic shock secondary to UGIB. Patient is s/p embolization with vascular surgery 7/27. Patient is currently intubated, sedated, and on MV in the ICU. Pharmacy consulted to assist with electrolyte monitoring and replacement as indicated.  Goal of Therapy:  Electrolytes within normal limits  Plan:  --K 5.7 >> 5.5. On isotonic bicarbonate drip for AKI / hyperkalemia --Corrected Ca 8.8 mg/dL. Patient received 1 g IV calcium gluconate given hyperkalemia / bleeding --Repeat potassium ordered for 1300 --Follow-up all other electrolytes with AM labs tomorrow  Benita Gutter January 15, 2021 1:59 PM

## 2020-12-23 NOTE — Progress Notes (Signed)
Black Apple watch with gray band taken off patient and given to wife Quennel Fawbush.

## 2020-12-23 NOTE — Progress Notes (Signed)
Per family request discussed goals of care with pts wife, children, and additional family members.  Following discussion pts family would like to proceed with Comfort Care Only.  Due to concern of possible hematemesis following removal of ETT and orogastric tube suggested attempting to insert nasogastric tube in an attempt to continue decompressing the abdomen once pt compassionately extubated in an attempt to avoid hematemesis   Family is agreeable with plan as outlined above.  Discussed plan with ICU Intensivist Dr. Galen Daft.  Will continue to monitor and assess pt.   Rosilyn Mings, AGNP  Pulmonary/Critical Care Pager [phone] (please enter 7 digits) PCCM Consult Pager [phone] (please enter 7 digits)

## 2020-12-23 NOTE — Progress Notes (Signed)
RT called to bedside for elective intubation for airway protection. Dr. Tamala Julian at the bedside. Patient was intubated using the Glidescope with #4 blade. 7.0cm ETT placed and secured at 26cm at the lip. Positive color change on EtCO2. Breath sounds equal bilaterally. Patient placed on mechanical ventilator to ordered settings. CXR pending for placement. Patient transported to CT scan and then up to ICU. Will continue to monitor.

## 2020-12-23 NOTE — Consult Note (Addendum)
Vonda Antigua, MD [address], [place], [place], [place], [address] [address], [place], [place], [place], [address] Phone: [phone]  Fax: [phone]  Consultation  Referring Provider:     Dr. Tamala Julian Primary Care Physician:  Rusty Aus, MD Reason for Consultation:     GI Bleed  Date of Admission:  December 31, 2020 Date of Consultation:  12/31/20         HPI:   KHALEEF STANDAGE is a 75 y.o. 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 Symptoms started the same day of presentation. Patient took his Eliquis just prior to presentation and is on twice a day Eliquis.  Initially, patient had an episode of hematemesis on presentation the ER, but then subsequently stabilized and blood pressures were stable.  Patient was awaiting bed in the ICU, but had another episode of hematemesis in the ER and was intubated for airway protection.  He became hypotensive and was admitted to the ICU.  Upon my evaluation, patient is currently undergoing central line placement for better access given his medical condition.  Blood pressures have stabilized, but patient continues to have hematemesis  Wife helps provide history since patient is intubated and sedated and states that in the past his GI bleeds has presented as melena but never hematemesis and this is new for him.  Patient was previously on Coumadin for A. fib, which was changed to Eliquis in the last few months.  Previous notes by gastroenterologist, Dr. Carlean Purl, [place] Ba, [place] reviewed, and mentions possible history of cirrhosis that he would need workup for. No history of varices.  Recent cardiology note where medications were changed from Coumadin to Eliquis on 09/10/2020 reviewed.  As per ER notes, patient initially declined elective intubation since the initial episode of hematemesis had resolved, but had to be  intubated as hematemesis reoccurred a few hours later.  Eppie Gibson was given early this morning due to second episode of hematemesis in the setting of Eliquis use  No abdominal pain or melena reported in chart or by patient's family (wife and sister)  Past Medical History:  Diagnosis Date   Basal cell carcinoma of skin 2012   removed several spots from arms and back   CAD (coronary artery disease)    a. 01/2010 CABG x 4: LIMA->LAD, VG->D1, VG->OM1, VG->PDA. b. NSTEMI 02/2012 in setting of AF-RVR with cath s/p BMS to RCA (plan for 1 month, possibly up to 3 months of Plavix)   Carotid disease, bilateral ([place])    a. 03/2011 U/S: 40-59% bilat Carotid dzs;  b. 04/2015 Carotid U/S: 40-59% bilat ICA stenosis; c. 05/2015 CTA Neck: [id number] RICA, [id number] LICA, mod-marked R vertebral stenosis, mod L vertebral stenosis.   Chronic kidney disease    small obtusion per pcp. ultrasound done on 08/29/2015   Diabetes mellitus type II, controlled ([place])    a. Variable CBG 02/2012 - several meds adjusted.   Dysrhythmia    intermittent Atrial Fibrillation   GERD (gastroesophageal reflux disease)    Heart murmur    Hyperlipidemia    Hypertension    Hypertensive heart disease    Iron deficiency anemia    "gets infusions q once in awhile" (04/27/2018)   Iron deficiency anemia 08/2015   received 2 units rbc one week ago, 3 IV iron infusion -last 1 week.   Macular edema bil  lazer work done previously   Mild aortic stenosis    a. 03/2011 Echo: EF 55-60%, Mild AS. b. Mild by cath 02/2012; c. 01/2014 Echo: EF 65-70%, Gr 1 DD, mild AS, mildly dil LA.   Multiple gastric ulcers 2006   Myocardial infarction [facility]) 02/2012   stents (x1) at time   Myocardial infarction [facility])    "2nd one was after 02/2012; don't know date" (04/27/2018)   Obesity    On home oxygen therapy    "2.5 w/CPAP" (04/27/2018) history of   OSA on CPAP    CPAP settings 3 with oxygen 2.5 (04/27/2018)   PAF (paroxysmal atrial fibrillation) ([place])    a. Newly  dx 02/2012 & initiated on Coumadin (spont converted to NSR).   Presence of permanent cardiac pacemaker    Shortness of breath dyspnea    Transfusion history    transfusions -1 month ago -2 units    Past Surgical History:  Procedure Laterality Date   ANGIOPLASTY     BASAL CELL CARCINOMA EXCISION     "shoulder, arm X 2"   BIOPSY  09/02/2020   Procedure: BIOPSY;  Surgeon: Gatha Mayer, MD;  Location: [facility];  Service: Endoscopy;;   CARDIAC CATHETERIZATION  2014   CARDIAC CATHETERIZATION  2021   [facility] x1 stent   CATARACT EXTRACTION W/ INTRAOCULAR LENS  IMPLANT, BILATERAL Bilateral 2016   COLONOSCOPY WITH PROPOFOL N/A 10/15/2015   Procedure: COLONOSCOPY WITH PROPOFOL;  Surgeon: Gatha Mayer, MD;  Location: [facility];  Service: Endoscopy;  Laterality: N/A;   CORONARY ANGIOPLASTY     Dr. Fletcher Anon   CORONARY ARTERY BYPASS GRAFT  2011   CABG X3-4; [facility], [place], [place],    CORONARY ATHERECTOMY  04/27/2018   CORONARY ATHERECTOMY N/A 04/27/2018   Procedure: CORONARY ATHERECTOMY;  Surgeon: Belva Crome, MD;  Location: [facility] LAB;  Service: Cardiovascular;  Laterality: N/A;   cyst L kidney  [place] Left 08/30/2015   Procedure: ENDARTERECTOMY CAROTID;  Surgeon: Katha Cabal, MD;  Location: [facility] ORS;  Service: Vascular;  Laterality: Left;   ESOPHAGOGASTRODUODENOSCOPY (EGD) WITH PROPOFOL N/A 10/15/2015   Procedure: ESOPHAGOGASTRODUODENOSCOPY (EGD) WITH PROPOFOL;  Surgeon: Gatha Mayer, MD;  [facility];  Service: Endoscopy;  Laterality: N/A;   ESOPHAGOGASTRODUODENOSCOPY (EGD) WITH PROPOFOL N/A 01/12/2020   Procedure: ESOPHAGOGASTRODUODENOSCOPY (EGD) WITH PROPOFOL;  Surgeon: Jerene Bears, MD;  Location: [facility] ENDOSCOPY;  Service: Gastroenterology;  Laterality: N/A;   ESOPHAGOGASTRODUODENOSCOPY (EGD) WITH PROPOFOL N/A 01/15/2020   Procedure: ESOPHAGOGASTRODUODENOSCOPY (EGD) WITH PROPOFOL;  Surgeon: Ladene Artist,  MD;  Location: [facility] ENDOSCOPY;  Service: Endoscopy;  Laterality: N/A;   ESOPHAGOGASTRODUODENOSCOPY (EGD) WITH PROPOFOL N/A 09/02/2020   Procedure: ESOPHAGOGASTRODUODENOSCOPY (EGD) WITH PROPOFOL;  Surgeon: Gatha Mayer, MD;  [facility];  Service: Endoscopy;  Laterality: N/A;  with APC   ESOPHAGOGASTRODUODENOSCOPY (EGD) WITH PROPOFOL N/A 11/21/2020   Procedure: ESOPHAGOGASTRODUODENOSCOPY (EGD) WITH PROPOFOL;  Surgeon: Gatha Mayer, MD;  [facility];  Service: Endoscopy;  Laterality: N/A;   EYE SURGERY Bilateral    "has macular edema cauterized" (04/27/2018)   GASTRIC VARICES BANDING  11/21/2020   Procedure: GASTRIC VARICES BANDING;  Surgeon: Gatha Mayer, MD;  [facility]: [facility];  Service: Endoscopy;;   GI RADIOFREQUENCY ABLATION  09/02/2020   Procedure: GI RADIOFREQUENCY ABLATION;  Surgeon: Gatha Mayer, MD;  [facility]: [facility];  Service: Endoscopy;;   HEMOSTASIS CLIP PLACEMENT  01/15/2020  Procedure: HEMOSTASIS CLIP PLACEMENT;  Surgeon: Ladene Artist, MD;  Location: [place];  Service: Endoscopy;;   HOT HEMOSTASIS N/A 01/12/2020   Procedure: HOT HEMOSTASIS (ARGON PLASMA COAGULATION/BICAP);  Surgeon: Jerene Bears, MD;  Location: [facility] ENDOSCOPY;  Service: Gastroenterology;  Laterality: N/A;   HOT HEMOSTASIS N/A 01/15/2020   Procedure: HOT HEMOSTASIS (ARGON PLASMA COAGULATION/BICAP);  Surgeon: Ladene Artist, MD;  Location: [facility] ENDOSCOPY;  Service: Endoscopy;  Laterality: N/A;   INGUINAL HERNIA REPAIR Right    LEFT HEART CATHETERIZATION WITH CORONARY ANGIOGRAM N/A 03/09/2012   Procedure: LEFT HEART CATHETERIZATION WITH CORONARY ANGIOGRAM;  Surgeon: Wellington Hampshire, MD;  Location: [place] CATH LAB;  Service: Cardiovascular;  Laterality: N/A;   PACEMAKER IMPLANT N/A 01/26/2020   Procedure: PACEMAKER IMPLANT;  Surgeon: Vickie Epley, MD;  Location: [place] CV LAB;  Service: Cardiovascular;  Laterality: N/A;   PERCUTANEOUS CORONARY STENT INTERVENTION (PCI-S)   03/09/2012   Procedure: PERCUTANEOUS CORONARY STENT INTERVENTION (PCI-S);  Surgeon: Wellington Hampshire, MD;  Location: [facility] CATH LAB;  Service: Cardiovascular;;   POLYPECTOMY  01/15/2020   Procedure: POLYPECTOMY;  Surgeon: Ladene Artist, MD;  Location: [place];  Service: Endoscopy;;   RIGHT/LEFT HEART CATH AND CORONARY ANGIOGRAPHY N/A 04/25/2018   Procedure: RIGHT/LEFT HEART CATH AND CORONARY ANGIOGRAPHY;  Surgeon: Wellington Hampshire, MD;  Location: [place] CV LAB;  Service: Cardiovascular;  Laterality: N/A;    Prior to Admission medications   Medication Sig Start Date End Date Taking? Authorizing Provider  ALPRAZolam Duanne Moron) 0.25 MG tablet Take 0.25 mg by mouth daily as needed for anxiety.   Yes [provider]  amiodarone (PACERONE) 200 MG tablet Take 200 mg by mouth 2 (two) times daily.   Yes [provider]  amLODipine (NORVASC) 5 MG tablet Take one tablet by mouth one time daily 07/25/14  Yes Wellington Hampshire, MD  apixaban (ELIQUIS) 5 MG TABS tablet Take 1 tablet (5 mg total) by mouth 2 (two) times daily. 09/10/20  Yes Wellington Hampshire, MD  aspirin EC 81 MG EC tablet Take 1 tablet (81 mg total) by mouth daily. Swallow whole. 01/17/20  Yes Vashti Hey, MD  Cyanocobalamin (B-12) 1000 MCG CAPS Take 1,000 mcg by mouth every evening.    Yes [provider]  furosemide (LASIX) 20 MG tablet Take 20 mg by mouth daily as needed for edema.   Yes [provider]  glimepiride (AMARYL) 4 MG tablet Take 4 mg by mouth daily.   Yes [provider]  insulin glargine (LANTUS) 100 UNIT/ML injection Inject 15 Units into the skin at bedtime.   Yes [provider]  isosorbide mononitrate (IMDUR) 30 MG 24 hr tablet TAKE 1 TABLET BY MOUTH DAILY. 12/10/20  Yes Wellington Hampshire, MD  metoprolol tartrate (LOPRESSOR) 25 MG tablet Take 1 tablet (25 mg) by mouth twice daily as needed for palpitations 02/09/20  Yes Dunn, Ryan M, PA-C  NOVOLOG FLEXPEN  100 UNIT/ML FlexPen Inject 3-30 Units into the skin 3 (three) times daily with meals. 05/26/17  Yes [provider]  Polyethylene Glycol 400 (BLINK TEARS OP) Place 1 drop into both eyes daily as needed (dry eyes).   Yes [provider]  ramipril (ALTACE) 10 MG capsule Take 10 mg by mouth daily.  09/17/15  Yes [provider]  rosuvastatin (CRESTOR) 40 MG tablet TAKE ONE TABLET BY MOUTH EVERY DAY 11/29/20  Yes Wellington Hampshire, MD  sucralfate (CARAFATE) 1 g tablet Take 1 g  by mouth 2 (two) times daily. 08/23/19  Yes [provider]  vitamin C (ASCORBIC ACID) 500 MG tablet Take 500 mg by mouth every evening.   Yes [provider]  Ferrous Gluconate-C-Folic Acid (IRON-C PO) Take 1 tablet by mouth daily.    [provider]  Omega-3 Fatty Acids (FISH OIL) 1000 MG CAPS Take 1,000 mg by mouth every evening.    [provider]  pantoprazole (PROTONIX) 40 MG tablet Take 1 tablet (40 mg total) by mouth daily. 12/28/19 03/08/29  Para Skeans, MD    Family History  Problem Relation Age of Onset   COPD Mother        alive 67   Bladder Cancer Mother    Heart attack Father        57 deceased   Lung cancer Maternal Aunt    Liver cancer Maternal Aunt    Kidney disease Paternal Uncle    Cancer Other        all paternal aunts and uncles   Prostate cancer Neg Hx    Kidney cancer Neg Hx      Social History   Tobacco Use   Smoking status: Never   Smokeless tobacco: Never   Tobacco comments:    tobacco use - no.no passive smoke in home  Vaping Use   Vaping Use: Never used  Substance Use Topics   Alcohol use: Yes    Comment: 04/27/2018 "couple drinks/year; if that"   Drug use: Never    Allergies as of 01/13/21   (No Known Allergies)    Review of Systems:    Unable to obtain as pt intubated and sedated. Other history obtained from chart and patient's wife   Physical Exam:  Constitutional: General:   Alert,  Well-developed,  well-nourished, pleasant and cooperative in NAD BP 133/67   Pulse (!) 59   Temp 97.6 F (36.4 C) (Oral)   Resp 15   Ht 5' 7.99" (1.727 m)   Wt 81.6 kg   SpO2 100%   BMI 27.38 kg/m   Eyes:  Sclera clear, no icterus.   Conjunctiva pink. PERRLA  Ears:  No scars, lesions or masses, Normal auditory acuity. Nose:  No deformity, discharge, or lesions. Mouth:  No deformity or lesions, oropharynx pink & moist.  Neck:  Supple; no masses or thyromegaly.  Respiratory: Intubated, sedated  Gastrointestinal:  Normal bowel sounds.   Soft, non-tender and non-distended without masses, hepatosplenomegaly or hernias noted.  No guarding or rebound tenderness.     Cardiac: No clubbing or edema.  No cyanosis. Normal posterior tibial pedal pulses noted.  Lymphatic:  No significant cervical or axillary adenopathy.  Psych:  Alert and cooperative. Normal mood and affect.  Musculoskeletal:  Normal gait. Head normocephalic, atraumatic. Symmetrical without gross deformities. 5/5 Upper and Lower extremity strength bilaterally.  Skin: Warm. Intact without significant lesions or rashes. No jaundice.  Neurologic:  Face symmetrical, tongue midline, Normal sensation to touch;  grossly normal neurologically.    LAB RESULTS: Recent Labs    01/13/2021 0027 01-13-21 0530  WBC 4.7 8.8  HGB 9.3* 12.3*  HCT 28.3* 36.6*  PLT 185 143*   BMET Recent Labs    01-13-21 0027 Jan 13, 2021 0530  NA 136 135  K 3.8 5.7*  CL 107 106  CO2 21* 23  GLUCOSE 283* 384*  BUN 26* 32*  CREATININE 1.89* 1.78*  CALCIUM 8.0* 7.7*   LFT Recent Labs    01-13-21 0027  PROT 5.3*  ALBUMIN 3.0*  AST 32  ALT 31  ALKPHOS 100  BILITOT 0.6   PT/INR Recent Labs    January 04, 2021 0027  LABPROT 16.6*  INR 1.3*    STUDIES: DG Chest 1 View  Result Date: 01/04/2021 CLINICAL DATA:  Central line placement EXAM: CHEST  1 VIEW COMPARISON:  01/04/2021 FINDINGS: Endotracheal tube with the tip 2.2 cm above the carina. Nasogastric  tube coursing below the diaphragm. Right jugular central venous catheter with the tip likely projecting over the cavoatrial junction, but is partially obscured by the overlying pacemaker leads. Mild bibasilar atelectasis. No focal consolidation. No pleural effusion or pneumothorax. Stable cardiomediastinal silhouette. Prior CABG. Dual lead cardiac pacemaker. No acute osseous abnormality. IMPRESSION: Right jugular central venous catheter with the tip likely projecting over the cavoatrial junction, but is partially obscured by the overlying pacemaker leads. Endotracheal tube in satisfactory position. Electronically Signed   By: Kathreen Devoid   On: 01-04-2021 06:20   CT CHEST ABDOMEN PELVIS W CONTRAST  Result Date: Jan 04, 2021 CLINICAL DATA:  Vomiting blood throughout the night.  Diaphoretic. EXAM: CT CHEST, ABDOMEN, AND PELVIS WITH CONTRAST TECHNIQUE: Multidetector CT imaging of the chest, abdomen and pelvis was performed following the standard protocol during bolus administration of intravenous contrast. CONTRAST:  25m OMNIPAQUE IOHEXOL 300 MG/ML  SOLN COMPARISON:  None. FINDINGS: CT CHEST FINDINGS Cardiovascular: No significant vascular findings. Normal heart size. No pericardial effusion. Prior CABG. Thoracic aortic atherosclerosis. Mediastinum/Nodes: No enlarged mediastinal, hilar, or axillary lymph nodes. Thyroid gland, trachea, and esophagus demonstrate no significant findings. Endotracheal tube with the tip 2 cm above the carina. Nasogastric tube terminating in the stomach. Lungs/Pleura: No pleural effusion. No pneumothorax. Bilateral atelectasis. No focal consolidation. Musculoskeletal: No acute osseous abnormality. No aggressive osseous lesion. CT ABDOMEN PELVIS FINDINGS Hepatobiliary: No focal liver abnormality is seen. No gallstones, gallbladder wall thickening, or biliary dilatation. Pancreas: Unremarkable. No pancreatic ductal dilatation or surrounding inflammatory changes. Spleen: Normal in size  without focal abnormality. Adrenals/Urinary Tract: Normal adrenal glands. No urolithiasis or obstructive uropathy. 2 stable right upper pole renal cysts. Normal bladder. Stomach/Bowel: Distended stomach with large amount of food particulate within the stomach. Focal area of hyperdense material isointense to contrast in the proximal posterior body of the stomach (image 57/series 2 and image 77/series 5) concerning for active gastric bleeding. Small bowel is decompressed. No bowel dilatation to suggest bowel obstruction. No bowel wall thickening or inflammatory changes. Normal appendix. Vascular/Lymphatic: Normal caliber abdominal aorta with atherosclerosis. No lymphadenopathy. Reproductive: Prostate is unremarkable. Other: No abdominal wall hernia or abnormality. No abdominopelvic ascites. Musculoskeletal: No acute osseous abnormality. No aggressive osseous lesion. CLINICAL DATA:  Vomiting blood throughout the night. Diaphoretic. EXAM: CT CHEST, ABDOMEN, AND PELVIS WITH CONTRAST TECHNIQUE: Multidetector CT imaging of the chest, abdomen and pelvis was performed following the standard protocol during bolus administration of intravenous contrast. CONTRAST:  766mOMNIPAQUE IOHEXOL 300 MG/ML  SOLN COMPARISON:  None. FINDINGS: CT CHEST FINDINGS Cardiovascular: No significant vascular findings. Normal heart size. No pericardial effusion. Prior CABG. Thoracic aortic atherosclerosis. Mediastinum/Nodes: No enlarged mediastinal, hilar, or axillary lymph nodes. Thyroid gland, trachea, and esophagus demonstrate no significant findings. Endotracheal tube with the tip 2 cm above the carina. Nasogastric tube terminating in the stomach. Lungs/Pleura: No pleural effusion. No pneumothorax. Bilateral linear airspace disease and mild interstitial thickening likely reflecting atelectasis and/or mild chronic interstitial disease. No focal consolidation. Musculoskeletal: No acute osseous abnormality. No aggressive osseous lesion. CT ABDOMEN  PELVIS FINDINGS Hepatobiliary: No focal liver abnormality is seen.  No gallstones, gallbladder wall thickening, or biliary dilatation. Pancreas: Unremarkable. No pancreatic ductal dilatation or surrounding inflammatory changes. Spleen: Normal in size without focal abnormality. Adrenals/Urinary Tract: Normal adrenal glands. No urolithiasis or obstructive uropathy. 2 stable right upper pole renal cysts. Normal bladder. Stomach/Bowel: Distended stomach with large amount of food particulate within the stomach. Focal area of hyperdense material isointense to contrast in the proximal posterior body of the stomach (image 57/series 2 and image 77/series 5) concerning for active gastric bleeding. Small bowel is decompressed. No bowel dilatation to suggest bowel obstruction. No bowel wall thickening or inflammatory changes. Normal appendix. Vascular/Lymphatic: Normal caliber abdominal aorta with atherosclerosis. No lymphadenopathy. Reproductive: Prostate is unremarkable. Other: No abdominal wall hernia or abnormality. No abdominopelvic ascites. Musculoskeletal: No acute osseous abnormality. No aggressive osseous lesion. IMPRESSION: 1. Focal area of hyperdense material isointense to contrast in the proximal posterior body of the stomach concerning for active gastric bleeding. Recommend vascular surgery consultation. 2. Aortic Atherosclerosis (ICD10-I70.0). Electronically Signed   By: Kathreen Devoid   On: 2020-12-28 06:13   DG Chest Portable 1 View  Result Date: 12-28-20 CLINICAL DATA:  Vomiting blood. EXAM: PORTABLE CHEST 1 VIEW COMPARISON:  January 26, 2020 FINDINGS: There is a dual lead AICD. Multiple sternal wires are noted. Mild linear atelectasis is seen within the bilateral lung bases. There is no evidence of acute infiltrate, pleural effusion or pneumothorax. The heart size and mediastinal contours are within normal limits. The visualized skeletal structures are unremarkable. IMPRESSION: Mild bibasilar linear  atelectasis. Electronically Signed   By: Virgina Norfolk M.D.   On: 12-28-2020 02:13      Impression / Plan:   KEMAURION HORWITZ is a 75 y.o. y/o male with history of gave with treatment in the past, most recently on November 21, 2020  Given ongoing active bleeding, endoscopy indicated at this time for further evaluation, despite Eliquis use  Benefits of procedure in this setting outweigh risks at this time due to ongoing bleeding, despite octreotide drip, Protonix drip, Kcentra  CT abdomen pelvis was ordered by ICU team upon admittance into the ICU, and result became available just not right before his upper endoscopy, and shows blood in the stomach.  We did discuss with the family that we may not be able to have good visualization during his upper endoscopy due to this, but at this time we would need to evaluate further to see if we can suction the blood to improve visualization  NG tube is already to suction to help clear stomach contents as well.  Patient has QT prolongation baseline, placing him at risk of using erythromycin for clinical stomach contents as well.  In addition, our pharmacy usually does not carry this either  If procedure today is not successful in controlling his active bleeding, we may need to consider further evaluation with vascular surgery, general surgery, or potential transfer depending on source of bleeding  Potential sources at this time include bleeding from gave, or from a possible ulcer site that may have occurred at his banding site as well  PPI IV twice daily  Octreotide drip  Gram negative coverage IV antibiotics given history of questionable cirrhosis mentioned on previous records  Continue serial CBCs and transfuse PRN Avoid NSAIDs Maintain 2 large-bore IV lines Please page GI with any acute hemodynamic changes, or signs of active GI bleeding  I have discussed alternative options, risks & benefits,  which include, but are not limited to, bleeding,  infection, perforation,respiratory complication & drug  reaction.  The patient or consenting adult agrees with this plan & written consent will be obtained.    Thank you for involving me in the care of this patient.      LOS: 0 days   Virgel Manifold, MD  01/11/21, 6:25 AM

## 2020-12-23 NOTE — Op Note (Signed)
[facility]  Percutaneous Study/Intervention Procedural Note     Surgeon(s): [employer]   Assistants: none  Pre-operative Diagnosis: 1. Upper GI bleed with hypotensive shock  Post-operative diagnosis:  Same  Procedure(s) Performed:             1.  Ultrasound guidance for vascular access right femoral artery             2.  Catheter placement into left gastric artery branches, the hepatic artery and gastroduodenal artery, and the splenic artery             3.  Aortogram and selective angiogram of the celiac artery, left gastric artery, hepatic artery and gastroduodenal artery, and the splenic artery             4.  Microbead embolization of left gastric artery branch with 2 cc of 700-900  polyvinyl alcohol beads as well as coil embolization of the left gastric artery branch with a 2 mm x 4 cm coil and a 15 cm long packing coil  5.  Microbead embolization of the left splenic artery with 3 cc of 700 to 900 m polyvinyl alcohol beads             6.  StarClose closure device right femoral artery  Anesthesia: Moderate conscious sedation for approximately 38 minutes using his existing propofol drip and fentanyl drip as well as an additional 100 mcg bolus of fentanyl            EBL: 5 cc  Fluoro Time: 6.1 minutes  Contrast: 75 cc              Indications:  Patient is a 75 y.o.male with brisk upper GI bleeding with resultant anemia.  The patient has had multiple previous endoscopic interventions for gastric bleeding in the past.  He is anticoagulated and this is being reversed.  He has a CT scan demonstrating active bleeding on the posterior wall of the stomach and the main body of the stomach.  This was in an area adjacent to the short gastric arteries but obviously with the multiple vessels feeding the stomach, multiple vessels could be feeding this area.  He is extremely high risk for surgery.  The patient is brought in for angiography for further evaluation and  potential treatment. Risks and benefits are discussed and informed consent is obtained  Procedure:  The patient was identified and appropriate procedural time out was performed.  The patient was then placed supine on the table and prepped and draped in the usual sterile fashion. Moderate conscious sedation was administered during a face to face encounter with the patient throughout the procedure with my supervision of the RN administering medicines and monitoring the patient's vital signs, pulse oximetry, telemetry and mental status throughout from the start of the procedure until the patient was taken to the recovery room.  Ultrasound was used to evaluate the right common femoral artery.  It was patent .  A digital ultrasound image was acquired.  A Seldinger needle was used to access the right common femoral artery under direct ultrasound guidance and a permanent image was performed.  A 0.035 J wire was advanced without resistance and a 5Fr sheath was placed.  Pigtail catheter was placed into the aorta and an AP aortogram was performed. This demonstrated relatively normal aorta and iliac arteries and relatively normal renal arteries.  Flow could be seen in the celiac and SMA but in the AP projection, the origins  were not particularly well seen.  I was able to cannulate the celiac artery in the AP projection and perform selective imaging.  The left gastric artery, splenic artery, and hepatic artery were all seen coming off of the celiac artery on initial imaging there was not obviously a blush into the stomach or anywhere else.  A prograde catheter was used to selectively cannulate the left gastric artery first.  This demonstrated a branch going across the body of the stomach to the posterior portion of the stomach that did appear to show a blush on selective images through the prograde microcatheter. Based on his continued bleeding and the CT scan I elected to treat this area with embolization. I initially  advanced the Pro-Great microcatheter out the left gastric artery branch going directly to the area in question and instilled approximately 2 cc of 700 to 900 m polyvinyl alcohol beads in this location. Angiogram following this showed the main vessels to be open with less brisk filling.  Due to the severity of his bleeding, I elected to go ahead and perform coil embolization of this branch as well.  2 Ruby coils were placed to completely embolize this left gastric branch.  A 2 mm diameter by 4 cm length soft coil was used initially and then a 15 cm length packing coil was placed behind this.  Angiogram following this showed complete embolization with no further blush or flow through this left gastric artery branch I then pulled back and cannulated the hepatic artery and then the gastroduodenal artery with the Pro-great microcatheter without difficulty. Selective imaging was performed which showed normal flow through the gastroduodenal artery and hepatic artery feeding only at the antrum of the stomach and the duodenum with no obvious bleeding in this area.  With the CT scan being worrisome for bleeding in the short gastric area, I elected to selectively cannulate the splenic artery and evaluate and potentially treat this area.  The prograde microcatheter was then used to selectively cannulate the splenic artery and selective imaging showed typical flow to the spleen with areas of short gastric artery.  Although there was not an obvious blush at this point, I felt additional microbead embolization to this area would be prudent to ensure that we had completely treated everything endovascularly that was possible.  Once we were out in the mid to distal splenic artery, an additional 3 cc of 700 to 900 m polyvinyl alcohol beads were deployed in the splenic artery to treat the short gastric arteries. Again, completion angiogram showed the main vessels to be open with less brisk filling. I elected to terminate the  procedure. The diagnostic catheter was removed. StarClose closure device was deployed in usual fashion with excellent hemostatic result. The patient was taken to the recovery room in stable condition having tolerated the procedure well.     Findings: Area suspicious for a blush in a branch off of the left gastric artery.  Treated with embolization.  Splenic artery also treated to address potential bleeding from the short gastric arteries.  Disposition: Patient was taken to the recovery room in stable condition having tolerated the procedure well.  Complications:  None  Leotis Pain 02-Jan-2021 10:24 AM   This note was created with Dragon Medical transcription system. Any errors in dictation are purely unintentional.

## 2020-12-23 NOTE — Procedures (Signed)
Central Venous Catheter Insertion Procedure Note  Seth Hardy  YH:[id number]  11-Jul-1945  Date:01-05-2021  Time:5:09 AM   Provider Performing:Drayce Tawil A [name]   Procedure: Insertion of Non-tunneled Central Venous [id number] with US guidance [id number]   Indication(s) Medication administration and Difficult access  Consent Risks of the procedure as well as the alternatives and risks of each were explained to the patient and/or caregiver.  Consent for the procedure was obtained and is signed in the bedside chart  Anesthesia Topical only with 1% lidocaine   Timeout Verified patient identification, verified procedure, site/side was marked, verified correct patient position, special equipment/implants available, medications/allergies/relevant history reviewed, required imaging and test results available.  Sterile Technique Maximal sterile technique including full sterile barrier drape, hand hygiene, sterile gown, sterile gloves, mask, hair covering, sterile ultrasound probe cover (if used).  Procedure Description Area of catheter insertion was cleaned with chlorhexidine and draped in sterile fashion.  With real-time ultrasound guidance a central venous catheter was placed into the left internal jugular vein. Nonpulsatile blood flow and easy flushing noted in all ports.  The catheter was sutured in place and sterile dressing applied.  Complications/Tolerance None; patient tolerated the procedure well. Chest X-ray is ordered to verify placement for internal jugular or subclavian cannulation.   Chest x-ray is not ordered for femoral cannulation.  EBL Minimal  Specimen(s) None    Rufina Falco, DNP, CCRN, FNP-C, AGACNP-BC Acute Care Nurse Practitioner  [facility] Pager: [phone] [place] [facility]

## 2020-12-23 NOTE — Consult Note (Signed)
[facility] Vascular Consult Note  MRN : [id number]  Seth Hardy is a 75 y.o. (1946/01/27) male who presents with chief complaint of  Chief Complaint  Patient presents with   Hematemesis  .  History of Present Illness: I am asked to see the patient by Rosilyn Mings, NP in the critical care unit for severe upper GI bleeding with hypotension.  The patient was intubated for airway protection and is on multiple pressors.  GI has seen the patient and the patient is undergone a CT scan which I have independently reviewed which appears to show a blush in the posterior body of the stomach.  The patient has a history of GAVE and previous GI bleeding.  He is unstable with hypotension requiring pressors.  He has active ongoing bleeding coming out the NG tube.  We are consulted for evaluation for potential embolization.  2 times earlier this year he has undergone treatment by GI for upper GI bleeding including a banding of variceal bleeding about a month ago and a radiofrequency ablation earlier in the year.  He maintains on anticoagulation due to very poor cardiac function and arrhythmia.  He was on Eliquis at the time of admission.  Current Facility-Administered Medications  Medication Dose Route Frequency Provider Last Rate Last Admin   [MAR Hold] 0.9 %  sodium chloride infusion (Manually program via Guardrails IV Fluids)   Intravenous Once Lang Snow, NP       [facility] Hold] 0.9 %  sodium chloride infusion (Manually program via Guardrails IV Fluids)   Intravenous Once Graves, Raeford Razor, NP       0.9 %  sodium chloride infusion   Intravenous Continuous Deziah Renwick, Erskine Squibb, MD       calcium gluconate 1 g/ 50 mL sodium chloride IVPB  1 g Intravenous Once Tyler Pita, MD 50 mL/hr at 12-30-2020 0835 1,000 mg at 12-30-2020 0835   desmopressin (DDAVP) 20 mcg in sodium chloride 0.9 % 50 mL IVPB  20 mcg Intravenous Once Tyler Pita, MD 100 mL/hr at December 30, 2020 0907 20 mcg at  12/30/2020 0907   [MAR Hold] docusate (COLACE) 50 MG/5ML liquid 100 mg  100 mg Per Tube BID Lang Snow, NP       [facility] Hold] docusate sodium (COLACE) capsule 100 mg  100 mg Oral BID PRN Lang Snow, NP       fentaNYL 2522mg in NS 2553m(1031mml) infusion-PREMIX  25-200 mcg/hr Intravenous Continuous OumLang SnowP 2.5 mL/hr at 07/08-12-2204 25 mcg/hr at 07/08-12-2204   [MAR Hold] insulin aspart (novoLOG) injection 0-15 Units  0-15 Units Subcutaneous Q4H SmiLucrezia StarchD   8 Units at 07/August 08, 202211   [facility] norepinephrine (LEVOPHED) '4mg'$  in 250m39memix infusion  0-40 mcg/min Intravenous Titrated OumaLang Snow 7.5 mL/hr at 07/2Aug 08, 20224 2 mcg/min at 07/22022/08/084   octreotide (SANDOSTATIN) 500 mcg in sodium chloride 0.9 % 250 mL (2 mcg/mL) infusion  50 mcg/hr Intravenous Continuous SmitLucrezia Starch 25 mL/hr at 07/2Aug 08, 20227 50 mcg/hr at 07/208/08/20227   [MAR Hold] pantoprazole (PROTONIX) injection 40 mg  40 mg Intravenous Q12H SmitLucrezia Starch       pantoprozole (PROTONIX) 80 mg /NS 100 mL infusion  8 mg/hr Intravenous Continuous SmitLucrezia Starch 10 mL/hr at 07/208/08/20227 8 mg/hr at 07/22022-08-087   [MAR Hold] phenylephrine CONCENTRATED '100mg'$  in sodium chloride 0.9% 250mL59m'4mg'$ /mL) infusion  0-400 mcg/min  Intravenous Titrated Milus Banister, NP       [MAR Hold] polyethylene glycol (MIRALAX / GLYCOLAX) packet 17 g  17 g Oral Daily PRN Lang Snow, NP       [facility] Hold] polyethylene glycol (MIRALAX / GLYCOLAX) packet 17 g  17 g Per Tube Daily Ouma, Bing Neighbors, NP       propofol (DIPRIVAN) 1000 MG/100ML infusion  0-50 mcg/kg/min Intravenous Continuous Lang Snow, NP 2.45 mL/hr at 01/02/2021 0600 5 mcg/kg/min at 01/02/2021 0600   sodium bicarbonate 150 mEq in sterile water 1,150 mL infusion   Intravenous Continuous Milus Banister, NP 100 mL/hr at 2021/01/02 0847 New Bag at 01-02-2021 0847   vasopressin (PITRESSIN) 50  Units in dextrose 5 % 250 mL (0.2 Units/mL) infusion  0-0.8 Units/min Intravenous Continuous Milus Banister, NP 60 mL/hr at 01/02/2021 0837 0.2 Units/min at Jan 02, 2021 [id number]    Past Medical History:  Diagnosis Date   Basal cell carcinoma of skin 2012   removed several spots from arms and back   CAD (coronary artery disease)    a. 01/2010 CABG x 4: LIMA->LAD, VG->D1, VG->OM1, VG->PDA. b. NSTEMI 02/2012 in setting of AF-RVR with cath s/p BMS to RCA (plan for 1 month, possibly up to 3 months of Plavix)   Carotid disease, bilateral ([place])    a. 03/2011 U/S: 40-59% bilat Carotid dzs;  b. 04/2015 Carotid U/S: 40-59% bilat ICA stenosis; c. 05/2015 CTA Neck: [id number] RICA, [id number] LICA, mod-marked R vertebral stenosis, mod L vertebral stenosis.   Chronic kidney disease    small obtusion per pcp. ultrasound done on 08/29/2015   Diabetes mellitus type II, controlled ([place])    a. Variable CBG 02/2012 - several meds adjusted.   Dysrhythmia    intermittent Atrial Fibrillation   GERD (gastroesophageal reflux disease)    Heart murmur    Hyperlipidemia    Hypertension    Hypertensive heart disease    Iron deficiency anemia    "gets infusions q once in awhile" (04/27/2018)   Iron deficiency anemia 08/2015   received 2 units rbc one week ago, 3 IV iron infusion -last 1 week.   Macular edema bil   lazer work done previously   Mild aortic stenosis    a. 03/2011 Echo: EF 55-60%, Mild AS. b. Mild by cath 02/2012; c. 01/2014 Echo: EF 65-70%, Gr 1 DD, mild AS, mildly dil LA.   Multiple gastric ulcers 2006   Myocardial infarction [facility]) 02/2012   stents (x1) at time   Myocardial infarction [facility])    "2nd one was after 02/2012; don't know date" (04/27/2018)   Obesity    On home oxygen therapy    "2.5 w/CPAP" (04/27/2018) history of   OSA on CPAP    CPAP settings 3 with oxygen 2.5 (04/27/2018)   PAF (paroxysmal atrial fibrillation) ([place])    a. Newly dx 02/2012 & initiated on Coumadin (spont converted to NSR).   Presence of  permanent cardiac pacemaker    Shortness of breath dyspnea    Transfusion history    transfusions -1 month ago -2 units    Past Surgical History:  Procedure Laterality Date   ANGIOPLASTY     BASAL CELL CARCINOMA EXCISION     "shoulder, arm X 2"   BIOPSY  09/02/2020   Procedure: BIOPSY;  Surgeon: Gatha Mayer, MD;  Location: [facility] ENDOSCOPY;  Service: Endoscopy;;   CARDIAC CATHETERIZATION  2014   CARDIAC CATHETERIZATION  2021   [facility] x1 stent   CATARACT EXTRACTION W/ INTRAOCULAR LENS  IMPLANT, BILATERAL Bilateral 2016   COLONOSCOPY WITH PROPOFOL N/A 10/15/2015   Procedure: COLONOSCOPY WITH PROPOFOL;  Surgeon: Gatha Mayer, MD;  Location: [facility] ENDOSCOPY;  Service: Endoscopy;  Laterality: N/A;   CORONARY ANGIOPLASTY     Dr. Fletcher Anon   CORONARY ARTERY BYPASS GRAFT  2011   CABG X3-4; [facility], [place], [place],    CORONARY ATHERECTOMY  04/27/2018   CORONARY ATHERECTOMY N/A 04/27/2018   Procedure: CORONARY ATHERECTOMY;  Surgeon: Belva Crome, MD;  Location: [place] CV LAB;  Service: Cardiovascular;  Laterality: N/A;   cyst L kidney  [place] Left 08/30/2015   Procedure: ENDARTERECTOMY CAROTID;  Surgeon: Katha Cabal, MD;  Location: [facility] ORS;  Service: Vascular;  Laterality: Left;   ESOPHAGOGASTRODUODENOSCOPY (EGD) WITH PROPOFOL N/A 10/15/2015   Procedure: ESOPHAGOGASTRODUODENOSCOPY (EGD) WITH PROPOFOL;  Surgeon: Gatha Mayer, MD;  [facility]: [facility];  Service: Endoscopy;  Laterality: N/A;   ESOPHAGOGASTRODUODENOSCOPY (EGD) WITH PROPOFOL N/A 01/12/2020   Procedure: ESOPHAGOGASTRODUODENOSCOPY (EGD) WITH PROPOFOL;  Surgeon: Jerene Bears, MD;  Location: [facility] ENDOSCOPY;  Service: Gastroenterology;  Laterality: N/A;   ESOPHAGOGASTRODUODENOSCOPY (EGD) WITH PROPOFOL N/A 01/15/2020   Procedure: ESOPHAGOGASTRODUODENOSCOPY (EGD) WITH PROPOFOL;  Surgeon: Ladene Artist, MD;  Location: [facility] ENDOSCOPY;  Service: Endoscopy;  Laterality: N/A;    ESOPHAGOGASTRODUODENOSCOPY (EGD) WITH PROPOFOL N/A 09/02/2020   Procedure: ESOPHAGOGASTRODUODENOSCOPY (EGD) WITH PROPOFOL;  Surgeon: Gatha Mayer, MD;  [facility]: [facility];  Service: Endoscopy;  Laterality: N/A;  with APC   ESOPHAGOGASTRODUODENOSCOPY (EGD) WITH PROPOFOL N/A 11/21/2020   Procedure: ESOPHAGOGASTRODUODENOSCOPY (EGD) WITH PROPOFOL;  Surgeon: Gatha Mayer, MD;  Location: [facility]OS[facility];  Service: Endoscopy;  Laterality: N/A;   EYE SURGERY Bilateral    "has macular edema cauterized" (04/27/2018)   GASTRIC VARICES BANDING  11/21/2020   Procedure: GASTRIC VARICES BANDING;  Surgeon: Gatha Mayer, MD;  Location: [facility] ENDOS[facility];  Service: Endoscopy;;   GI RADIOFREQUENCY ABLATION  09/02/2020   Procedure: GI RADIOFREQUENCY ABLATION;  Surgeon: Gatha Mayer, MD;  Location: [facility];  Service: Endoscopy;;   HEMOSTASIS CLIP PLACEMENT  01/15/2020   Procedure: HEMOSTASIS CLIP PLACEMENT;  Surgeon: Ladene Artist, MD;  Location: [place];  Service: Endoscopy;;   HOT HEMOSTASIS N/A 01/12/2020   Procedure: HOT HEMOSTASIS (ARGON PLASMA COAGULATION/BICAP);  Surgeon: Jerene Bears, MD;  Location: [facility] ENDOSCOPY;  Service: Gastroenterology;  Laterality: N/A;   HOT HEMOSTASIS N/A 01/15/2020   Procedure: HOT HEMOSTASIS (ARGON PLASMA COAGULATION/BICAP);  Surgeon: Ladene Artist, MD;  Location: [facility] ENDOSCOPY;  Service: Endoscopy;  Laterality: N/A;   INGUINAL HERNIA REPAIR Right    LEFT HEART CATHETERIZATION WITH CORONARY ANGIOGRAM N/A 03/09/2012   Procedure: LEFT HEART CATHETERIZATION WITH CORONARY ANGIOGRAM;  Surgeon: Wellington Hampshire, MD;  Location: [place] CATH LAB;  Service: Cardiovascular;  Laterality: N/A;   PACEMAKER IMPLANT N/A 01/26/2020   Procedure: PACEMAKER IMPLANT;  Surgeon: Vickie Epley, MD;  Location: [place] CV LAB;  Service: Cardiovascular;  Laterality: N/A;   PERCUTANEOUS CORONARY STENT INTERVENTION (PCI-S)  03/09/2012   Procedure: PERCUTANEOUS CORONARY STENT INTERVENTION  (PCI-S);  Surgeon: Wellington Hampshire, MD;  Location: [facility] CATH LAB;  Service: Cardiovascular;;   POLYPECTOMY  01/15/2020   Procedure: POLYPECTOMY;  Surgeon: Ladene Artist, MD;  Location: [facility] ENDOSCOPY;  Service: Endoscopy;;   RIGHT/LEFT HEART CATH AND CORONARY ANGIOGRAPHY N/A 04/25/2018   Procedure: RIGHT/LEFT HEART CATH AND CORONARY ANGIOGRAPHY;  Surgeon:  Wellington Hampshire, MD;  Location: [place] CV LAB;  Service: Cardiovascular;  Laterality: N/A;     Social History   Tobacco Use   Smoking status: Never   Smokeless tobacco: Never   Tobacco comments:    tobacco use - no.no passive smoke in home  Vaping Use   Vaping Use: Never used  Substance Use Topics   Alcohol use: Yes    Comment: 04/27/2018 "couple drinks/year; if that"   Drug use: Never     Family History  Problem Relation Age of Onset   COPD Mother        alive 83   Bladder Cancer Mother    Heart attack Father        68 deceased   Lung cancer Maternal Aunt    Liver cancer Maternal Aunt    Kidney disease Paternal Uncle    Cancer Other        all paternal aunts and uncles   Prostate cancer Neg Hx    Kidney cancer Neg Hx     No Known Allergies   REVIEW OF SYSTEMS (Negative unless checked) Unable to obtain, patient intubated and sedated  Physical Examination  Vitals:   December 21, 2020 0445 Dec 21, 2020 0500 12/21/2020 0800 21-Dec-2020 0840  BP: 133/67  (!) 77/48   Pulse: (!) 59  60   Resp: '15  15 12  '$ Temp:    97.9 F (36.6 C)  TempSrc:    Oral  SpO2: 100%  100% 100%  Weight:  81.7 kg    Height:       Body mass index is 27.38 kg/m. Gen:  patient intubated and sedate, critically ill, pale Head: [place]/AT, No temporalis wasting.  Ear/Nose/Throat:  nares w/o erythema or drainage, oropharynx w/o Erythema/Exudate Eyes: Sclera non-icteric, conjunctiva clear Neck: Trachea midline.  No JVD.  Pulmonary:  course BS Bilaterally, on the vent Cardiac: RRR, normal S1, S2. Vascular:  Vessel Right Left  Radial Palpable Palpable                                    Gastrointestinal: soft, mildly distended Musculoskeletal: M/S 5/5 throughout.  Extremities are cool, difficult to assess strength and motor as he is sedated Neurologic: difficult to assess, intubated and sedated Psychiatric: unable to assess, intubated and sedated Dermatologic: No rashes or ulcers noted.  No cellulitis or open wounds.      CBC Lab Results  Component Value Date   WBC 8.8 Dec 21, 2020   HGB 10.4 (L) December 21, 2020   HCT 30.2 (L) 2020-12-21   MCV 93.4 December 21, 2020   PLT 167 12/21/2020    BMET    Component Value Date/Time   NA 135 Dec 21, 2020 0530   NA 139 01/22/2013 0348   K 5.5 (H) 12-21-20 0810   K 4.2 01/22/2013 0348   CL 106 2020-12-21 0530   CL 106 01/22/2013 0348   CO2 23 12/21/2020 0530   CO2 28 01/22/2013 0348   GLUCOSE 384 (H) 21-Dec-2020 0530   GLUCOSE 158 (H) 01/22/2013 0348   BUN 32 (H) 12-21-20 0530   BUN 16 01/22/2013 0348   CREATININE 1.78 (H) 12-21-2020 0530   CREATININE 1.02 01/22/2013 0348   CALCIUM 7.7 (L) 12/21/2020 0530   CALCIUM 8.6 01/22/2013 0348   GFRNONAA 40 (L) 2020-12-21 0530   GFRNONAA >60 01/22/2013 0348   GFRAA >60 01/30/2020 0903   GFRAA >60 01/22/2013 0348   Estimated Creatinine Clearance:  35.2 mL/min (A) (by C-G formula based on SCr of 1.78 mg/dL (H)).  COAG Lab Results  Component Value Date   INR 1.4 (H) 12-21-2020   INR 1.3 (H) Dec 21, 2020   INR 3.3 (A) 08/28/2020    Radiology DG Chest 1 View  Result Date: 12/21/20 CLINICAL DATA:  Central line placement EXAM: CHEST  1 VIEW COMPARISON:  12/21/20 FINDINGS: Endotracheal tube with the tip 2.2 cm above the carina. Nasogastric tube coursing below the diaphragm. Right jugular central venous catheter with the tip likely projecting over the cavoatrial junction, but is partially obscured by the overlying pacemaker leads. Mild bibasilar atelectasis. No focal consolidation. No pleural effusion or pneumothorax. Stable  cardiomediastinal silhouette. Prior CABG. Dual lead cardiac pacemaker. No acute osseous abnormality. IMPRESSION: Right jugular central venous catheter with the tip likely projecting over the cavoatrial junction, but is partially obscured by the overlying pacemaker leads. Endotracheal tube in satisfactory position. Electronically Signed   By: Kathreen Devoid   On: 12-21-20 06:20   CT CHEST ABDOMEN PELVIS W CONTRAST  Result Date: 21-Dec-2020 CLINICAL DATA:  Vomiting blood throughout the night.  Diaphoretic. EXAM: CT CHEST, ABDOMEN, AND PELVIS WITH CONTRAST TECHNIQUE: Multidetector CT imaging of the chest, abdomen and pelvis was performed following the standard protocol during bolus administration of intravenous contrast. CONTRAST:  19m OMNIPAQUE IOHEXOL 300 MG/ML  SOLN COMPARISON:  None. FINDINGS: CT CHEST FINDINGS Cardiovascular: No significant vascular findings. Normal heart size. No pericardial effusion. Prior CABG. Thoracic aortic atherosclerosis. Mediastinum/Nodes: No enlarged mediastinal, hilar, or axillary lymph nodes. Thyroid gland, trachea, and esophagus demonstrate no significant findings. Endotracheal tube with the tip 2 cm above the carina. Nasogastric tube terminating in the stomach. Lungs/Pleura: No pleural effusion. No pneumothorax. Bilateral atelectasis. No focal consolidation. Musculoskeletal: No acute osseous abnormality. No aggressive osseous lesion. CT ABDOMEN PELVIS FINDINGS Hepatobiliary: No focal liver abnormality is seen. No gallstones, gallbladder wall thickening, or biliary dilatation. Pancreas: Unremarkable. No pancreatic ductal dilatation or surrounding inflammatory changes. Spleen: Normal in size without focal abnormality. Adrenals/Urinary Tract: Normal adrenal glands. No urolithiasis or obstructive uropathy. 2 stable right upper pole renal cysts. Normal bladder. Stomach/Bowel: Distended stomach with large amount of food particulate within the stomach. Focal area of hyperdense material  isointense to contrast in the proximal posterior body of the stomach (image 57/series 2 and image 77/series 5) concerning for active gastric bleeding. Small bowel is decompressed. No bowel dilatation to suggest bowel obstruction. No bowel wall thickening or inflammatory changes. Normal appendix. Vascular/Lymphatic: Normal caliber abdominal aorta with atherosclerosis. No lymphadenopathy. Reproductive: Prostate is unremarkable. Other: No abdominal wall hernia or abnormality. No abdominopelvic ascites. Musculoskeletal: No acute osseous abnormality. No aggressive osseous lesion. CLINICAL DATA:  Vomiting blood throughout the night. Diaphoretic. EXAM: CT CHEST, ABDOMEN, AND PELVIS WITH CONTRAST TECHNIQUE: Multidetector CT imaging of the chest, abdomen and pelvis was performed following the standard protocol during bolus administration of intravenous contrast. CONTRAST:  728mOMNIPAQUE IOHEXOL 300 MG/ML  SOLN COMPARISON:  None. FINDINGS: CT CHEST FINDINGS Cardiovascular: No significant vascular findings. Normal heart size. No pericardial effusion. Prior CABG. Thoracic aortic atherosclerosis. Mediastinum/Nodes: No enlarged mediastinal, hilar, or axillary lymph nodes. Thyroid gland, trachea, and esophagus demonstrate no significant findings. Endotracheal tube with the tip 2 cm above the carina. Nasogastric tube terminating in the stomach. Lungs/Pleura: No pleural effusion. No pneumothorax. Bilateral linear airspace disease and mild interstitial thickening likely reflecting atelectasis and/or mild chronic interstitial disease. No focal consolidation. Musculoskeletal: No acute osseous abnormality. No aggressive osseous lesion. CT ABDOMEN PELVIS  FINDINGS Hepatobiliary: No focal liver abnormality is seen. No gallstones, gallbladder wall thickening, or biliary dilatation. Pancreas: Unremarkable. No pancreatic ductal dilatation or surrounding inflammatory changes. Spleen: Normal in size without focal abnormality. Adrenals/Urinary  Tract: Normal adrenal glands. No urolithiasis or obstructive uropathy. 2 stable right upper pole renal cysts. Normal bladder. Stomach/Bowel: Distended stomach with large amount of food particulate within the stomach. Focal area of hyperdense material isointense to contrast in the proximal posterior body of the stomach (image 57/series 2 and image 77/series 5) concerning for active gastric bleeding. Small bowel is decompressed. No bowel dilatation to suggest bowel obstruction. No bowel wall thickening or inflammatory changes. Normal appendix. Vascular/Lymphatic: Normal caliber abdominal aorta with atherosclerosis. No lymphadenopathy. Reproductive: Prostate is unremarkable. Other: No abdominal wall hernia or abnormality. No abdominopelvic ascites. Musculoskeletal: No acute osseous abnormality. No aggressive osseous lesion. IMPRESSION: 1. Focal area of hyperdense material isointense to contrast in the proximal posterior body of the stomach concerning for active gastric bleeding. Recommend vascular surgery consultation. 2. Aortic Atherosclerosis (ICD10-I70.0). Electronically Signed   By: Kathreen Devoid   On: 01/09/2021 06:13   DG Chest Portable 1 View  Result Date: 09-Jan-2021 CLINICAL DATA:  Vomiting blood. EXAM: PORTABLE CHEST 1 VIEW COMPARISON:  January 26, 2020 FINDINGS: There is a dual lead AICD. Multiple sternal wires are noted. Mild linear atelectasis is seen within the bilateral lung bases. There is no evidence of acute infiltrate, pleural effusion or pneumothorax. The heart size and mediastinal contours are within normal limits. The visualized skeletal structures are unremarkable. IMPRESSION: Mild bibasilar linear atelectasis. Electronically Signed   By: Virgina Norfolk M.D.   On: 01/09/2021 02:13   CUP PACEART REMOTE DEVICE CHECK  Result Date: 12/06/2020 Scheduled remote reviewed. Normal device function.  Noted Rhythmic EGMs show FFRW sensing. Next remote 91 days.     Assessment/Plan 1.  Upper GI  bleed with hypotension and shock.  This is likely coming from the stomach although the source is not entirely clear.  We have been asked to consider embolization as a less invasive option for surgery.  I have discussed this with his wife as he is intubated and cannot make decisions at this time.  Our embolization options are somewhat limited for the stomach, but I am certainly willing to perform angiography and evaluate him and if possible we will perform embolization.  He may ultimately require gastrectomy.  General surgery has seen him as well and has tentatively put him on the OR schedule pending the results of angiography.  Very grave and serious situation with a high risk of mortality in a patient with multiple medical comorbidities. 2.  Anemia.  Has chronic anemia and now acute blood loss anemia on top of that.  Has received blood products and is getting reversal of his anticoagulation. 3.  Coronary disease.  Poor cardiac function is on chronic anticoagulation. 4.  COPD.  Now on a ventilator and pulmonary is managing extubation and ventilator status. 5.   Carotid artery disease.  Status post left carotid endarterectomy several years ago.   Leotis Pain, MD  January 09, 2021 9:23 AM    This note was created with Dragon medical transcription system.  Any error is purely unintentional

## 2020-12-23 NOTE — Progress Notes (Signed)
Updated pts wife Magdaleno Kister regarding plan of care and all questions were answered. Will continue to monitor and assess pt.  Rosilyn Mings, AGNP  Pulmonary/Critical Care Pager [phone] (please enter 7 digits) PCCM Consult Pager [phone] (please enter 7 digits)

## 2020-12-23 NOTE — Progress Notes (Signed)
Pt was transported to vascular lab and back to CCU while on the vent.

## 2020-12-23 NOTE — Death Summary Note (Addendum)
DEATH SUMMARY   Patient Details  Name: Seth Hardy MRN: [id number] DOB: [birth year]  Admission/Discharge Information   Admit Date:  01-11-21  Date of Death: Date of Death: January 11, 2021  Time of Death: Time of Death: 2107/09/04  Length of Stay: 1  Referring Physician: Rusty Aus, MD   Reason(s) for Hospitalization  Massive Upper GI Bleed  Diagnoses  Preliminary cause of death:  Secondary Diagnoses (including complications and co-morbidities):  Active Problems:   Acute GI bleeding   Hematemesis with nausea   Goals of care, counseling/discussion   Brief Hospital Course (including significant findings, care, treatment, and services provided and events leading to death)  Seth Hardy is a 75 y.o. year old male who presented to the ED with massive upper GI bleed. In the ED, he was noted with significant drop in hgb from 12.4-9.3.While waiting in the ED patient continued to have bloody emesis and was also noted to be more lethargic, diaphoretic with agonal respiration.  Patient was emergently intubated for airway protection.  He received 2 units of emergency release PRBC as well as reversal of his Eliquis with Kcentra. Patient was transferred to the ICU for close monitoring. While in the ICU, he continued to have episodes of hematemesis and became hemodynamically unstable requiring vasopressor support. GI was consulted who performed urgent bedside endoscopy and suspected Dieulafoy lesion however she could not visualize this well.  The patient was resuscitated with crystalloid, blood transfusion, correcting coagulation factors and reversal of Eliquis with Kcentra.  Vascular surgeon was contacted for embolization which again was done emergently. Patient was taken back to the ICU for recovery, unfortunately he continued to have further episodes of active bleeding and hypotension. Repeat hgb 6.9 and fibrinogen level 293.  Pt received 2 units of FFP; 2 pools of cryoprecipitate; and 2 units of  pRBC's.Vascular surgeon and General surgeon were again notified.Goals of care was discussed with patient's family regarding prognosis.They decided to NOT proceed with surgery, they do not want Seth Hardy to suffer. They all  decided to change pts code status to DO NOT RESUSCITATE. The option of transitioning pt to Comfort Measures Only was also discussed.Per family request patient was transitioned to [place] Only.  Due to concern of possible hematemesis following removal of ETT and orogastric tube suggested attempting to insert nasogastric tube in an attempt to continue decompressing the abdomen once pt compassionately extubated in an attempt to avoid hematemesis . Patient was extubated and passed away peacefully with family at the bedside. He was pronounce at September 04, 2107 on January 11, 2021.  Pertinent Labs and Studies  Significant Diagnostic Studies DG Chest 1 View  Result Date: 2021/01/11 CLINICAL DATA:  Central line placement EXAM: CHEST  1 VIEW COMPARISON:  Jan 11, 2021 FINDINGS: Endotracheal tube with the tip 2.2 cm above the carina. Nasogastric tube coursing below the diaphragm. Right jugular central venous catheter with the tip likely projecting over the cavoatrial junction, but is partially obscured by the overlying pacemaker leads. Mild bibasilar atelectasis. No focal consolidation. No pleural effusion or pneumothorax. Stable cardiomediastinal silhouette. Prior CABG. Dual lead cardiac pacemaker. No acute osseous abnormality. IMPRESSION: Right jugular central venous catheter with the tip likely projecting over the cavoatrial junction, but is partially obscured by the overlying pacemaker leads. Endotracheal tube in satisfactory position. Electronically Signed   By: Kathreen Devoid   On: 01-11-21 06:20   CT CHEST ABDOMEN PELVIS W CONTRAST  Result Date: 01/11/2021 CLINICAL DATA:  Vomiting blood throughout the night.  Diaphoretic. EXAM: CT CHEST,  ABDOMEN, AND PELVIS WITH CONTRAST TECHNIQUE: Multidetector CT  imaging of the chest, abdomen and pelvis was performed following the standard protocol during bolus administration of intravenous contrast. CONTRAST:  76m OMNIPAQUE IOHEXOL 300 MG/ML  SOLN COMPARISON:  None. FINDINGS: CT CHEST FINDINGS Cardiovascular: No significant vascular findings. Normal heart size. No pericardial effusion. Prior CABG. Thoracic aortic atherosclerosis. Mediastinum/Nodes: No enlarged mediastinal, hilar, or axillary lymph nodes. Thyroid gland, trachea, and esophagus demonstrate no significant findings. Endotracheal tube with the tip 2 cm above the carina. Nasogastric tube terminating in the stomach. Lungs/Pleura: No pleural effusion. No pneumothorax. Bilateral atelectasis. No focal consolidation. Musculoskeletal: No acute osseous abnormality. No aggressive osseous lesion. CT ABDOMEN PELVIS FINDINGS Hepatobiliary: No focal liver abnormality is seen. No gallstones, gallbladder wall thickening, or biliary dilatation. Pancreas: Unremarkable. No pancreatic ductal dilatation or surrounding inflammatory changes. Spleen: Normal in size without focal abnormality. Adrenals/Urinary Tract: Normal adrenal glands. No urolithiasis or obstructive uropathy. 2 stable right upper pole renal cysts. Normal bladder. Stomach/Bowel: Distended stomach with large amount of food particulate within the stomach. Focal area of hyperdense material isointense to contrast in the proximal posterior body of the stomach (image 57/series 2 and image 77/series 5) concerning for active gastric bleeding. Small bowel is decompressed. No bowel dilatation to suggest bowel obstruction. No bowel wall thickening or inflammatory changes. Normal appendix. Vascular/Lymphatic: Normal caliber abdominal aorta with atherosclerosis. No lymphadenopathy. Reproductive: Prostate is unremarkable. Other: No abdominal wall hernia or abnormality. No abdominopelvic ascites. Musculoskeletal: No acute osseous abnormality. No aggressive osseous lesion. CLINICAL  DATA:  Vomiting blood throughout the night. Diaphoretic. EXAM: CT CHEST, ABDOMEN, AND PELVIS WITH CONTRAST TECHNIQUE: Multidetector CT imaging of the chest, abdomen and pelvis was performed following the standard protocol during bolus administration of intravenous contrast. CONTRAST:  756mOMNIPAQUE IOHEXOL 300 MG/ML  SOLN COMPARISON:  None. FINDINGS: CT CHEST FINDINGS Cardiovascular: No significant vascular findings. Normal heart size. No pericardial effusion. Prior CABG. Thoracic aortic atherosclerosis. Mediastinum/Nodes: No enlarged mediastinal, hilar, or axillary lymph nodes. Thyroid gland, trachea, and esophagus demonstrate no significant findings. Endotracheal tube with the tip 2 cm above the carina. Nasogastric tube terminating in the stomach. Lungs/Pleura: No pleural effusion. No pneumothorax. Bilateral linear airspace disease and mild interstitial thickening likely reflecting atelectasis and/or mild chronic interstitial disease. No focal consolidation. Musculoskeletal: No acute osseous abnormality. No aggressive osseous lesion. CT ABDOMEN PELVIS FINDINGS Hepatobiliary: No focal liver abnormality is seen. No gallstones, gallbladder wall thickening, or biliary dilatation. Pancreas: Unremarkable. No pancreatic ductal dilatation or surrounding inflammatory changes. Spleen: Normal in size without focal abnormality. Adrenals/Urinary Tract: Normal adrenal glands. No urolithiasis or obstructive uropathy. 2 stable right upper pole renal cysts. Normal bladder. Stomach/Bowel: Distended stomach with large amount of food particulate within the stomach. Focal area of hyperdense material isointense to contrast in the proximal posterior body of the stomach (image 57/series 2 and image 77/series 5) concerning for active gastric bleeding. Small bowel is decompressed. No bowel dilatation to suggest bowel obstruction. No bowel wall thickening or inflammatory changes. Normal appendix. Vascular/Lymphatic: Normal caliber  abdominal aorta with atherosclerosis. No lymphadenopathy. Reproductive: Prostate is unremarkable. Other: No abdominal wall hernia or abnormality. No abdominopelvic ascites. Musculoskeletal: No acute osseous abnormality. No aggressive osseous lesion. IMPRESSION: 1. Focal area of hyperdense material isointense to contrast in the proximal posterior body of the stomach concerning for active gastric bleeding. Recommend vascular surgery consultation. 2. Aortic Atherosclerosis (ICD10-I70.0). Electronically Signed   By: HeKathreen Devoid On: 0708/12/20226:13   PERIPHERAL VASCULAR CATHETERIZATION  Result  Date: January 01, 2021 See surgical note for result.  DG Chest Portable 1 View  Result Date: January 01, 2021 CLINICAL DATA:  Vomiting blood. EXAM: PORTABLE CHEST 1 VIEW COMPARISON:  January 26, 2020 FINDINGS: There is a dual lead AICD. Multiple sternal wires are noted. Mild linear atelectasis is seen within the bilateral lung bases. There is no evidence of acute infiltrate, pleural effusion or pneumothorax. The heart size and mediastinal contours are within normal limits. The visualized skeletal structures are unremarkable. IMPRESSION: Mild bibasilar linear atelectasis. Electronically Signed   By: Virgina Norfolk M.D.   On: 01/01/2021 02:13   CUP PACEART REMOTE DEVICE CHECK  Result Date: 12/06/2020 Scheduled remote reviewed. Normal device function.  Noted Rhythmic EGMs show FFRW sensing. Next remote 91 days.   Microbiology Recent Results (from the past 240 hour(s))  Resp Panel by RT-PCR (Flu A&B, Covid) Nasopharyngeal Swab     Status: None   Collection Time: 2021-01-01 12:28 AM   Specimen: Nasopharyngeal Swab; Nasopharyngeal(NP) swabs in vial transport medium  Result Value Ref Range Status   SARS Coronavirus 2 by RT PCR NEGATIVE NEGATIVE Final    Comment: (NOTE) SARS-CoV-2 target nucleic acids are NOT DETECTED.  The SARS-CoV-2 RNA is generally detectable in upper respiratory specimens during the acute phase of  infection. The lowest concentration of SARS-CoV-2 viral copies this assay can detect is 138 copies/mL. A negative result does not preclude SARS-Cov-2 infection and should not be used as the sole basis for treatment or other patient management decisions. A negative result may occur with  improper specimen collection/handling, submission of specimen other than nasopharyngeal swab, presence of viral mutation(s) within the areas targeted by this assay, and inadequate number of viral copies(<138 copies/mL). A negative result must be combined with clinical observations, patient history, and epidemiological information. The expected result is Negative.  Fact Sheet for Patients:  EntrepreneurPulse.com.au  Fact Sheet for Healthcare Providers:  IncredibleEmployment.be  This test is no t yet approved or cleared by the [place] FDA and  has been authorized for detection and/or diagnosis of SARS-CoV-2 by FDA under an Emergency Use Authorization (EUA). This EUA will remain  in effect (meaning this test can be used) for the duration of the COVID-19 declaration under Section 564(b)(1) of the Act, 21 U.S.C.section 360bbb-3(b)(1), unless the authorization is terminated  or revoked sooner.       Influenza A by PCR NEGATIVE NEGATIVE Final   Influenza B by PCR NEGATIVE NEGATIVE Final    Comment: (NOTE) The Xpert Xpress SARS-CoV-2/FLU/RSV plus assay is intended as an aid in the diagnosis of influenza from Nasopharyngeal swab specimens and should not be used as a sole basis for treatment. Nasal washings and aspirates are unacceptable for Xpert Xpress SARS-CoV-2/FLU/RSV testing.  Fact Sheet for Patients: EntrepreneurPulse.com.au  Fact Sheet for Healthcare Providers: IncredibleEmployment.be  This test is not yet approved or cleared by the [place] FDA and has been authorized for detection and/or diagnosis of SARS-CoV-2  by FDA under an Emergency Use Authorization (EUA). This EUA will remain in effect (meaning this test can be used) for the duration of the COVID-19 declaration under Section 564(b)(1) of the Act, 21 U.S.C. section 360bbb-3(b)(1), unless the authorization is terminated or revoked.  Performed at [facility], [address] [place], [place] [address]     Lab Basic Metabolic Panel: Recent Labs  Lab Jan 01, 2021 0027 01-Jan-2021 0530 2021/01/01 0810 01/01/21 1428  NA 136 135  --   --   K 3.8 5.7* 5.5* 5.1  CL  107 106  --   --   CO2 21* 23  --   --   GLUCOSE 283* 384*  --   --   BUN 26* 32*  --   --   CREATININE 1.89* 1.78*  --   --   CALCIUM 8.0* 7.7*  --   --   MG 2.0 2.1  --   --   PHOS  --  6.3*  --   --    Liver Function Tests: Recent Labs  Lab 04-Jan-2021 0027  AST 32  ALT 31  ALKPHOS 100  BILITOT 0.6  PROT 5.3*  ALBUMIN 3.0*   No results for input(s): LIPASE, AMYLASE in the last 168 hours. No results for input(s): AMMONIA in the last 168 hours. CBC: Recent Labs  Lab 01-04-21 0027 01-04-21 0530 Jan 04, 2021 0810 01-04-2021 1428  WBC 4.7 8.8  --  12.9*  NEUTROABS 2.8  --   --  10.5*  HGB 9.3* 12.3* 10.4* 6.9*  HCT 28.3* 36.6* 30.2* 20.0*  MCV 100.0 93.4  --  91.3  PLT 185 143* 167 127*   Cardiac Enzymes: No results for input(s): CKTOTAL, CKMB, CKMBINDEX, TROPONINI in the last 168 hours. Sepsis Labs: Recent Labs  Lab 2021-01-04 0027 01-04-21 0530 2021-01-04 0815 Jan 04, 2021 0911 01/04/2021 1428  PROCALCITON  --  <0.10  --   --   --   WBC 4.7 8.8  --   --  12.9*  LATICACIDVEN  --   --  2.8* 4.6*  --     Procedures/Operations   Critical Care Time 30 minutes     Rufina Falco, DNP, CCRN, FNP-C, AGACNP-BC Acute Care Nurse Practitioner  [facility] Pager: [phone] [place] at [facility]

## 2020-12-23 NOTE — ED Triage Notes (Signed)
Pt brought in via ems from home with vomiting blood tonight.   Pale and diaphoretic.  Hx cabg iv in place   pt alert

## 2020-12-23 NOTE — Progress Notes (Signed)
Discussed pt prognosis, plan of care, and code status with pts wife, son, and additional family members. They have decided to NOT proceed with surgery, they do not want Mr. Raj to suffer. They all have decided to change pts code status to DO NOT RESUSCITATE.  I also discussed the option of transitioning pt to Comfort Measures Only.  At this time family would like to continue current treatment for now, and will inform the PCCM team if they decide to transition pt to Comfort Measures Only at a later time.  They have additional family members that will be arriving at the hospital to visit with pt.  Will continue to monitor and assess pt. Case discussed with ICU Intensivist Dr. Galen Daft.   Rosilyn Mings, AGNP  Pulmonary/Critical Care Pager [phone] (please enter 7 digits) PCCM Consult Pager [phone] (please enter 7 digits)

## 2020-12-23 NOTE — Progress Notes (Signed)
Dr. Bonna Gains at bedside preforming upper endoscopy.

## 2020-12-23 DEATH — deceased

## 2020-12-24 ENCOUNTER — Telehealth: Payer: Self-pay | Admitting: Internal Medicine

## 2020-12-24 NOTE — Telephone Encounter (Signed)
On 8/02- I called patient's wife, Stanton Kidney and offered my condolences.  Family very thankful for the call. Appreciate the cancer center staff for the care and support provided to the patient.   GB

## 2021-01-31 ENCOUNTER — Other Ambulatory Visit: Payer: HMO

## 2021-01-31 ENCOUNTER — Ambulatory Visit: Payer: HMO | Admitting: Internal Medicine

## 2021-01-31 ENCOUNTER — Ambulatory Visit: Payer: HMO

## 2021-02-10 ENCOUNTER — Other Ambulatory Visit: Payer: HMO

## 2021-02-13 ENCOUNTER — Ambulatory Visit: Payer: HMO | Admitting: Cardiovascular Disease

## 2021-03-03 LAB — BLOOD GAS, ARTERIAL
Acid-base deficit: 7.1 mmol/L — ABNORMAL HIGH (ref 0.0–2.0)
Bicarbonate: 18.7 mmol/L — ABNORMAL LOW (ref 20.0–28.0)
FIO2: 0.4
MECHVT: 500 mL
O2 Saturation: 99.5 %
PEEP: 5 cmH2O
Patient temperature: 37
RATE: 16 resp/min
pCO2 arterial: 38 mmHg (ref 32.0–48.0)
pH, Arterial: 7.3 — ABNORMAL LOW (ref 7.350–7.450)
pO2, Arterial: 179 mmHg — ABNORMAL HIGH (ref 83.0–108.0)

## 2021-12-02 IMAGING — DX DG CHEST 1V PORT
1 series · 1 of 1 positions shown · non-contrast
Comparison: CT 08/20/2015, radiograph 07/25/2015

CLINICAL DATA: Chest pain

EXAM:
PORTABLE CHEST 1 VIEW

[chest ap]
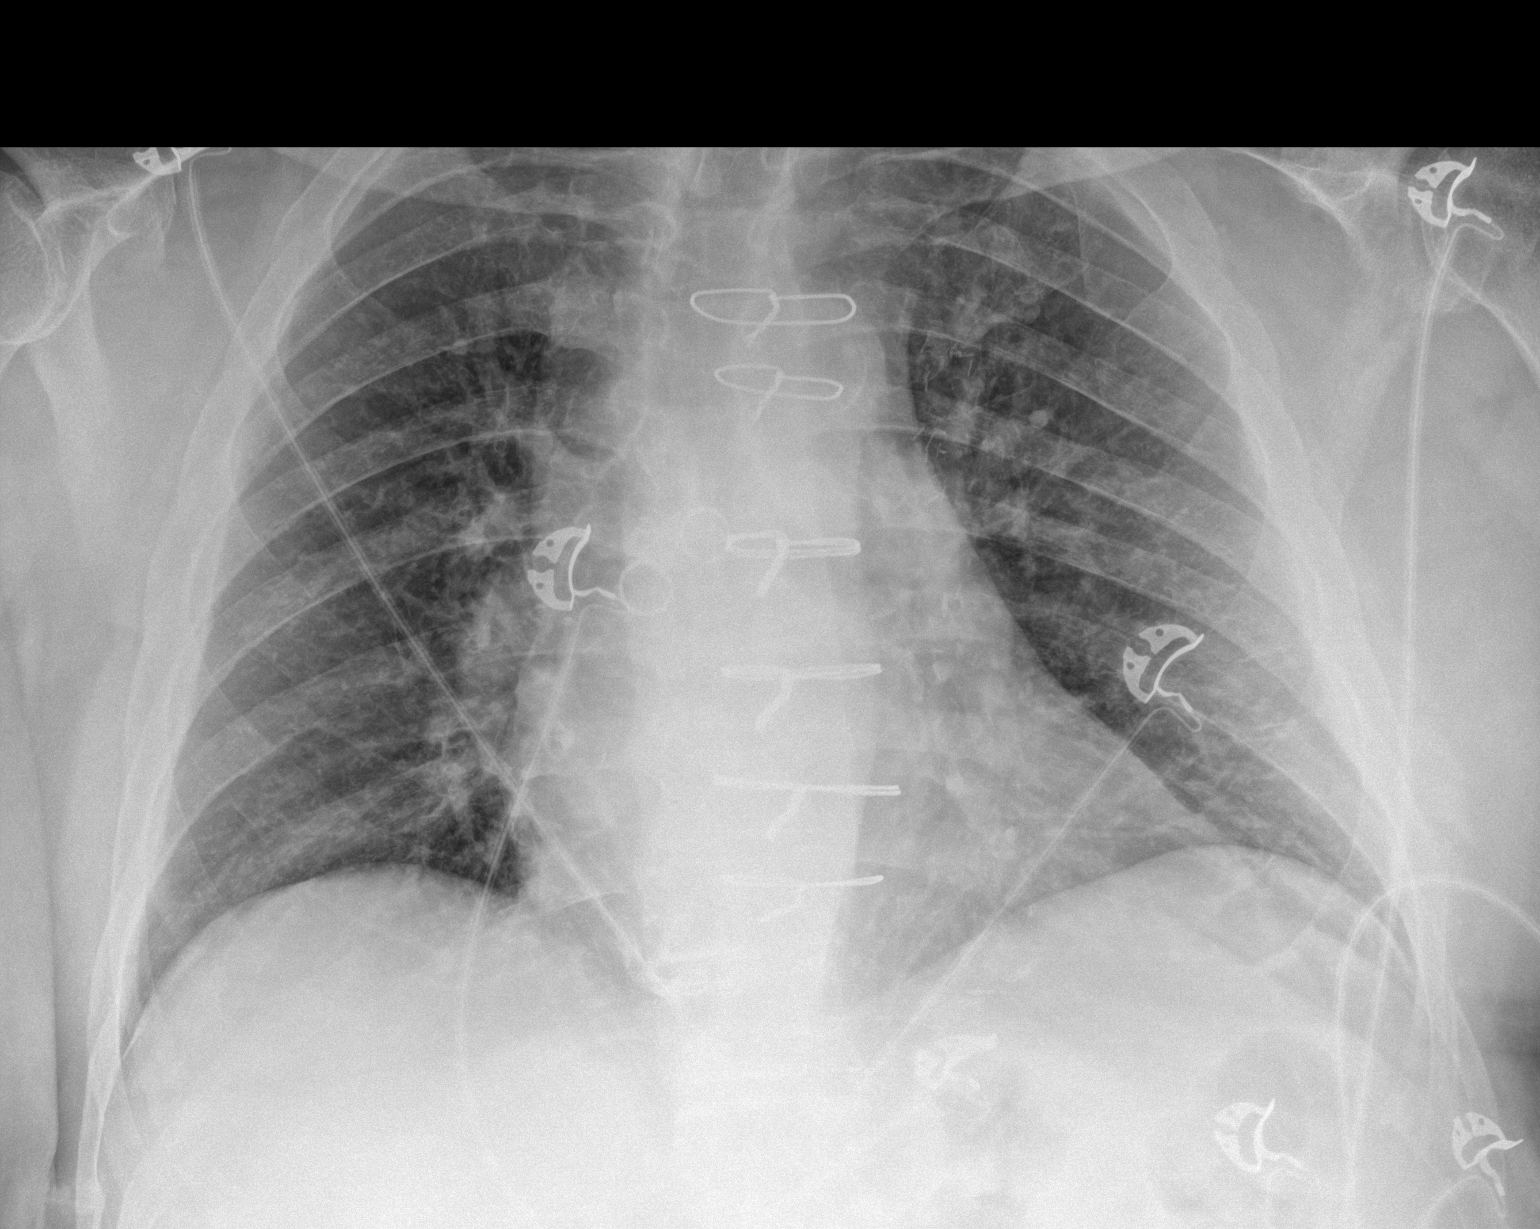

[1 of 1 positions shown; findings below may reference images not displayed]

FINDINGS: Postsurgical changes related to prior CABG including intact and
aligned sternotomy wires and multiple surgical clips projecting over
the mediastinum. Mild cardiomegaly is similar to prior accounting
for the portable technique. The aorta is calcified. The remaining
cardiomediastinal contours are unremarkable. Central pulmonary
vascular congestion is noted. There are few peripheral septal lines
as well as some hazy bibasilar opacity. No pneumothorax or visible
effusion. No focal consolidative opacity is seen. No acute osseous
or soft tissue abnormality. Telemetry leads overlie the chest.
IMPRESSION: 1. Mild cardiomegaly with central pulmonary vascular congestion with
features of mild interstitial edema.
